# Patient Record
Sex: Female | Born: 1940 | Race: Black or African American | Hispanic: No | Marital: Married | Smoking: Former smoker
Health system: Southern US, Community
[De-identification: ages and names within clinical notes are randomized; demographics above are authoritative.]

## PROBLEM LIST (undated history)

## (undated) DIAGNOSIS — N1832 Chronic kidney disease, stage 3b: Secondary | ICD-10-CM

## (undated) DIAGNOSIS — I10 Essential (primary) hypertension: Secondary | ICD-10-CM

## (undated) DIAGNOSIS — Z9989 Dependence on other enabling machines and devices: Secondary | ICD-10-CM

## (undated) DIAGNOSIS — R519 Headache, unspecified: Secondary | ICD-10-CM

## (undated) DIAGNOSIS — E7800 Pure hypercholesterolemia, unspecified: Secondary | ICD-10-CM

## (undated) DIAGNOSIS — M65331 Trigger finger, right middle finger: Secondary | ICD-10-CM

## (undated) DIAGNOSIS — E785 Hyperlipidemia, unspecified: Secondary | ICD-10-CM

## (undated) DIAGNOSIS — T4145XA Adverse effect of unspecified anesthetic, initial encounter: Secondary | ICD-10-CM

## (undated) DIAGNOSIS — G4733 Obstructive sleep apnea (adult) (pediatric): Secondary | ICD-10-CM

## (undated) DIAGNOSIS — R011 Cardiac murmur, unspecified: Secondary | ICD-10-CM

## (undated) DIAGNOSIS — M1990 Unspecified osteoarthritis, unspecified site: Secondary | ICD-10-CM

## (undated) DIAGNOSIS — I672 Cerebral atherosclerosis: Secondary | ICD-10-CM

## (undated) DIAGNOSIS — G47411 Narcolepsy with cataplexy: Secondary | ICD-10-CM

## (undated) DIAGNOSIS — T8859XA Other complications of anesthesia, initial encounter: Secondary | ICD-10-CM

## (undated) DIAGNOSIS — G8929 Other chronic pain: Secondary | ICD-10-CM

## (undated) DIAGNOSIS — M65351 Trigger finger, right little finger: Secondary | ICD-10-CM

## (undated) DIAGNOSIS — M65321 Trigger finger, right index finger: Secondary | ICD-10-CM

## (undated) DIAGNOSIS — M65311 Trigger thumb, right thumb: Secondary | ICD-10-CM

## (undated) DIAGNOSIS — M65341 Trigger finger, right ring finger: Secondary | ICD-10-CM

## (undated) DIAGNOSIS — I739 Peripheral vascular disease, unspecified: Secondary | ICD-10-CM

## (undated) DIAGNOSIS — K623 Rectal prolapse: Secondary | ICD-10-CM

## (undated) DIAGNOSIS — K219 Gastro-esophageal reflux disease without esophagitis: Secondary | ICD-10-CM

## (undated) DIAGNOSIS — G43909 Migraine, unspecified, not intractable, without status migrainosus: Secondary | ICD-10-CM

## (undated) DIAGNOSIS — K922 Gastrointestinal hemorrhage, unspecified: Secondary | ICD-10-CM

## (undated) DIAGNOSIS — R51 Headache: Secondary | ICD-10-CM

## (undated) HISTORY — DX: Narcolepsy with cataplexy: G47.411

## (undated) HISTORY — DX: Dependence on other enabling machines and devices: Z99.89

## (undated) HISTORY — PX: ROTATOR CUFF REPAIR: SHX139

## (undated) HISTORY — DX: Headache, unspecified: R51.9

## (undated) HISTORY — PX: TUBAL LIGATION: SHX77

## (undated) HISTORY — DX: Hyperlipidemia, unspecified: E78.5

## (undated) HISTORY — DX: Pure hypercholesterolemia, unspecified: E78.00

## (undated) HISTORY — DX: Other chronic pain: G89.29

## (undated) HISTORY — PX: ABDOMINAL HYSTERECTOMY: SHX81

## (undated) HISTORY — DX: Essential (primary) hypertension: I10

## (undated) HISTORY — PX: BACK SURGERY: SHX140

## (undated) HISTORY — DX: Obstructive sleep apnea (adult) (pediatric): G47.33

## (undated) HISTORY — DX: Unspecified osteoarthritis, unspecified site: M19.90

## (undated) HISTORY — DX: Cardiac murmur, unspecified: R01.1

## (undated) HISTORY — DX: Migraine, unspecified, not intractable, without status migrainosus: G43.909

## (undated) HISTORY — DX: Headache: R51

## (undated) HISTORY — PX: CARDIAC CATHETERIZATION: SHX172

## (undated) HISTORY — DX: Cerebral atherosclerosis: I67.2

## (undated) HISTORY — PX: TOTAL ABDOMINAL HYSTERECTOMY: SHX209

## (undated) MED FILL — Iron Sucrose Inj 20 MG/ML (Fe Equiv): INTRAVENOUS | Qty: 10 | Status: AC

---

## 1996-11-13 HISTORY — PX: KIDNEY SURGERY: SHX687

## 1998-05-14 ENCOUNTER — Ambulatory Visit (HOSPITAL_COMMUNITY): Admission: RE | Admit: 1998-05-14 | Discharge: 1998-05-14 | Payer: Self-pay | Admitting: *Deleted

## 1998-05-29 ENCOUNTER — Emergency Department (HOSPITAL_COMMUNITY): Admission: EM | Admit: 1998-05-29 | Discharge: 1998-05-29 | Payer: Self-pay | Admitting: Emergency Medicine

## 1998-06-18 ENCOUNTER — Inpatient Hospital Stay (HOSPITAL_COMMUNITY): Admission: RE | Admit: 1998-06-18 | Discharge: 1998-06-21 | Payer: Self-pay | Admitting: Urology

## 1999-05-20 ENCOUNTER — Ambulatory Visit (HOSPITAL_COMMUNITY): Admission: RE | Admit: 1999-05-20 | Discharge: 1999-05-20 | Payer: Self-pay | Admitting: Obstetrics & Gynecology

## 2000-06-08 ENCOUNTER — Ambulatory Visit (HOSPITAL_COMMUNITY): Admission: RE | Admit: 2000-06-08 | Discharge: 2000-06-08 | Payer: Self-pay | Admitting: *Deleted

## 2000-06-11 ENCOUNTER — Encounter: Payer: Self-pay | Admitting: Family Medicine

## 2000-06-11 ENCOUNTER — Encounter: Admission: RE | Admit: 2000-06-11 | Discharge: 2000-06-11 | Payer: Self-pay | Admitting: Family Medicine

## 2000-07-25 ENCOUNTER — Encounter: Payer: Self-pay | Admitting: *Deleted

## 2000-07-25 ENCOUNTER — Ambulatory Visit (HOSPITAL_COMMUNITY): Admission: RE | Admit: 2000-07-25 | Discharge: 2000-07-25 | Payer: Self-pay | Admitting: *Deleted

## 2001-07-19 ENCOUNTER — Ambulatory Visit (HOSPITAL_COMMUNITY): Admission: RE | Admit: 2001-07-19 | Discharge: 2001-07-19 | Payer: Self-pay

## 2001-07-30 ENCOUNTER — Encounter: Admission: RE | Admit: 2001-07-30 | Discharge: 2001-10-28 | Payer: Self-pay | Admitting: Family Medicine

## 2001-11-08 ENCOUNTER — Observation Stay (HOSPITAL_COMMUNITY): Admission: EM | Admit: 2001-11-08 | Discharge: 2001-11-11 | Payer: Self-pay | Admitting: Emergency Medicine

## 2001-11-08 ENCOUNTER — Encounter: Payer: Self-pay | Admitting: Emergency Medicine

## 2001-11-11 ENCOUNTER — Encounter: Payer: Self-pay | Admitting: Internal Medicine

## 2002-03-04 ENCOUNTER — Encounter: Payer: Self-pay | Admitting: Internal Medicine

## 2002-03-05 ENCOUNTER — Inpatient Hospital Stay (HOSPITAL_COMMUNITY): Admission: EM | Admit: 2002-03-05 | Discharge: 2002-03-08 | Payer: Self-pay | Admitting: Emergency Medicine

## 2002-03-07 ENCOUNTER — Encounter: Payer: Self-pay | Admitting: Internal Medicine

## 2002-03-21 ENCOUNTER — Encounter: Payer: Self-pay | Admitting: Gastroenterology

## 2002-03-21 ENCOUNTER — Encounter: Admission: RE | Admit: 2002-03-21 | Discharge: 2002-03-21 | Payer: Self-pay | Admitting: Gastroenterology

## 2002-03-25 ENCOUNTER — Ambulatory Visit (HOSPITAL_COMMUNITY): Admission: RE | Admit: 2002-03-25 | Discharge: 2002-03-25 | Payer: Self-pay | Admitting: Gastroenterology

## 2002-07-25 ENCOUNTER — Ambulatory Visit (HOSPITAL_COMMUNITY): Admission: RE | Admit: 2002-07-25 | Discharge: 2002-07-25 | Payer: Self-pay | Admitting: Obstetrics & Gynecology

## 2004-06-27 ENCOUNTER — Encounter (INDEPENDENT_AMBULATORY_CARE_PROVIDER_SITE_OTHER): Payer: Self-pay | Admitting: *Deleted

## 2004-06-27 ENCOUNTER — Ambulatory Visit (HOSPITAL_COMMUNITY): Admission: RE | Admit: 2004-06-27 | Discharge: 2004-06-27 | Payer: Self-pay | Admitting: Gastroenterology

## 2008-06-10 ENCOUNTER — Encounter: Admission: RE | Admit: 2008-06-10 | Discharge: 2008-06-10 | Payer: Self-pay | Admitting: Family Medicine

## 2008-07-09 ENCOUNTER — Encounter (INDEPENDENT_AMBULATORY_CARE_PROVIDER_SITE_OTHER): Payer: Self-pay | Admitting: Neurology

## 2008-07-09 ENCOUNTER — Ambulatory Visit: Payer: Self-pay

## 2009-04-19 ENCOUNTER — Encounter: Admission: RE | Admit: 2009-04-19 | Discharge: 2009-04-19 | Payer: Self-pay | Admitting: Family Medicine

## 2009-05-25 ENCOUNTER — Ambulatory Visit (HOSPITAL_COMMUNITY): Admission: RE | Admit: 2009-05-25 | Discharge: 2009-05-27 | Payer: Self-pay | Admitting: Orthopedic Surgery

## 2010-12-30 ENCOUNTER — Other Ambulatory Visit (HOSPITAL_COMMUNITY): Payer: Self-pay | Admitting: Family Medicine

## 2010-12-30 DIAGNOSIS — Z1231 Encounter for screening mammogram for malignant neoplasm of breast: Secondary | ICD-10-CM

## 2011-01-10 ENCOUNTER — Ambulatory Visit (HOSPITAL_COMMUNITY)
Admission: RE | Admit: 2011-01-10 | Discharge: 2011-01-10 | Disposition: A | Discharge status: Home / Self Care | Payer: Medicare Other | Source: Ambulatory Visit | Attending: Family Medicine | Admitting: Family Medicine

## 2011-01-10 DIAGNOSIS — Z1231 Encounter for screening mammogram for malignant neoplasm of breast: Secondary | ICD-10-CM

## 2011-02-19 LAB — GLUCOSE, CAPILLARY
Glucose-Capillary: 121 mg/dL — ABNORMAL HIGH (ref 70–99)
Glucose-Capillary: 129 mg/dL — ABNORMAL HIGH (ref 70–99)
Glucose-Capillary: 137 mg/dL — ABNORMAL HIGH (ref 70–99)
Glucose-Capillary: 146 mg/dL — ABNORMAL HIGH (ref 70–99)
Glucose-Capillary: 156 mg/dL — ABNORMAL HIGH (ref 70–99)
Glucose-Capillary: 98 mg/dL (ref 70–99)
Glucose-Capillary: 99 mg/dL (ref 70–99)

## 2011-02-19 LAB — HEMOGLOBIN AND HEMATOCRIT, BLOOD
HCT: 36.2 % (ref 36.0–46.0)
HCT: 37.8 % (ref 36.0–46.0)
Hemoglobin: 11.9 g/dL — ABNORMAL LOW (ref 12.0–15.0)
Hemoglobin: 12.3 g/dL (ref 12.0–15.0)

## 2011-02-19 LAB — CBC
HCT: 39.8 % (ref 36.0–46.0)
Hemoglobin: 13 g/dL (ref 12.0–15.0)
MCHC: 32.7 g/dL (ref 30.0–36.0)
MCV: 83.2 fL (ref 78.0–100.0)
Platelets: 259 10*3/uL (ref 150–400)
RBC: 4.78 MIL/uL (ref 3.87–5.11)
RDW: 14.8 % (ref 11.5–15.5)
WBC: 5.5 10*3/uL (ref 4.0–10.5)

## 2011-02-19 LAB — COMPREHENSIVE METABOLIC PANEL
Albumin: 3.9 g/dL (ref 3.5–5.2)
BUN: 11 mg/dL (ref 6–23)
Creatinine, Ser: 0.83 mg/dL (ref 0.4–1.2)
Total Protein: 7.3 g/dL (ref 6.0–8.3)

## 2011-02-19 LAB — DIFFERENTIAL
Lymphocytes Relative: 33 % (ref 12–46)
Monocytes Absolute: 0.5 10*3/uL (ref 0.1–1.0)
Monocytes Relative: 8 % (ref 3–12)
Neutro Abs: 3 10*3/uL (ref 1.7–7.7)

## 2011-02-19 LAB — URINE MICROSCOPIC-ADD ON

## 2011-02-19 LAB — URINALYSIS, ROUTINE W REFLEX MICROSCOPIC
Bilirubin Urine: NEGATIVE
Nitrite: NEGATIVE
Specific Gravity, Urine: 1.017 (ref 1.005–1.030)
Urobilinogen, UA: 0.2 mg/dL (ref 0.0–1.0)

## 2011-02-19 LAB — TYPE AND SCREEN

## 2011-02-19 LAB — APTT: aPTT: 31 seconds (ref 24–37)

## 2011-02-19 LAB — ABO/RH: ABO/RH(D): O POS

## 2011-03-28 NOTE — Op Note (Signed)
NAME:  Stephanie Barber, Stephanie Barber NO.:  [id number]   MEDICAL RECORD NO.:  [id number]          PATIENT TYPE:  AMB   LOCATION:  DAY                          FACILITY:  [facility]   PHYSICIAN:  Ronald A. Gioffre, M.D.DATE OF BIRTH:  12-Aug-1941   DATE OF PROCEDURE:  05/25/2009  DATE OF DISCHARGE:                               OPERATIVE REPORT   SURGEON:  Georges Lynch. Darrelyn Hillock, M.D.   ASSISTANT:  Marlowe Kays, M.D.   PREOPERATIVE DIAGNOSIS:  1. Spinal stenosis at L4-5.  2. 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  3. Foot drop on the left.   POSTOPERATIVE DIAGNOSIS:  1. Spinal stenosis at L4-5.  2. 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  3. Foot drop on the left.   OPERATION:  1. Central decompressive lumbar laminectomy at L4-5 for spinal      stenosis.  2. Microdiskectomy, L4-5, on the left for a large herniated disk.  3. Foraminotomies L4-5, left.   PROCEDURE NOTE:  She had left leg pain only preop.   PROCEDURE:  Under general anesthesia, she first had 1 gram of IV Ancef.  Sterile prep and drape of the lower back was carried out.  He was on the  spinal frame.  Following that, 2 needles were placed in the back for  localization purposes.  An x-ray was taken.  At this time, incision was  made over the L4-5 interspace.  Bleeders were identified and cauterized.  The muscle was stripped from the lamina and spinous processes  bilaterally.  Another x-ray was taken with 2 Kocher clamps and a  Penfield 4 instrument in place to verify our position.  We then went  down and carried out our central decompressive lumbar laminectomy for  spinal stenosis.  We decompressed both sides.  We preserved the facets.  We then removed ligamentum flavum after we brought the microscope in.  We did protect the dura at all times.  We went down did a nice  foraminotomy for the L5 root on the left.  We then gently isolated the  dura and the L5 root, and the microscope was used.  A cruciate incision  was made in the posterior  longitudinal ligament.  She had large  scattered pieces of disk material that we teased out from the  subligamentous space.  We then went into the disk space itself and  completed a diskectomy.  We did remove some disk material out laterally  on the foramen as well.  We made multiple attempts to remove the disk.  There was a large thickened posterior longitudinal ligament.  The nerve  root now was free.  We were able to easily move the hockey-stick up back  and forth above and anterior and posterior to the nerve root out the  foramen.  It was freed.  The dura was free.  We thoroughly irrigated out  the area.  Good hemostasis was maintained.  We injected 10 mL of FloSeal  and then loosely  applied some Gelfoam out laterally.  The wound was closed in layers in  the usual fashion.  I did leave a small deep distal and proximal part  of  the wound open for drainage purposes.  Subcu was closed with 0 Vicryl.  Skin was closed with metal staples.  Sterile Neosporin dressing was  applied.           ______________________________  Georges Lynch Darrelyn Hillock, M.D.     RAG/MEDQ  D:  05/25/2009  T:  05/25/2009  Job:  [id number]

## 2011-03-31 NOTE — Op Note (Signed)
NAME:  Stephanie Barber, Stephanie Barber                      ACCOUNT NO.:  [id number]   MEDICAL RECORD NO.:  [id number]                   PATIENT TYPE:  AMB   LOCATION:  ENDO                                 FACILITY:  [facility]   PHYSICIAN:  Bernette Redbird, M.D.                DATE OF BIRTH:  12-Feb-1941   DATE OF PROCEDURE:  06/27/2004  DATE OF DISCHARGE:                                 OPERATIVE REPORT   PROCEDURE:  Colonoscopy with biopsies.   INDICATIONS FOR PROCEDURE:  70 year old female with altered bowel habits and  rectal bleeding.   FINDINGS:  Pancolonic diverticulosis.   PROCEDURE:  The nature, purpose, and risks of the procedure were familiar to  the patient from prior examination, but the risks were reviewed at a recent  office visit and she provided written consent.  Sedation was Fentanyl 60 mcg  and Versed 5 mg IV without arrhythmias or desaturations.  The Olympus  adjustable tension pediatric video colonoscope was advanced to the base of  the cecum without significant difficulty, turning the patient into the  supine position and applying some external abdominal compression to get the  tip of the scope to enter the base of the cecum.  Pull back was then  performed.  I entered the terminal ileum for a short distance and it  appeared normal.  The main finding on this exam was rather extensive  pancolonic diverticulosis.  However, there was no evidence of colitis to  account for her tendency toward frequent bowel movements with rectal  bleeding.  No polyps, cancer, or vascular malformations were noted.  Retroflexion in the rectum was really unremarkable without significant  hemorrhoids and reinspection of the rectum was unremarkable.  The patient  tolerated the procedure well and there were no apparent complications.   IMPRESSION:  1. Change in bowel habits without obvious endoscopic abnormalities with     rectal bleeding presumably due to rectal outlet origin (569.3).  2. Pancolonic  diverticulosis.   PLAN:  Await pathology on random colonic biopsies which were obtained during  the course of today's procedure.                                               Bernette Redbird, M.D.    RB/MEDQ  D:  06/27/2004  T:  06/27/2004  Job:  [id number]   cc:   [name] A. Nicholos Johns, M.D.  [address]  [place]  [place] [address]  Fax: [phone]

## 2011-03-31 NOTE — Discharge Summary (Signed)
[facility]  Patient:    Stephanie Barber, Stephanie Barber Visit Number: [id number] MRN: [id number]          Service Type: MED Location: [id number] Attending Physician:  Jackie Plum Dictated by:   Jackie Plum, M.D. Admit Date:  11/08/2001 Disc. Date: 11/11/01   CC:         Francisca December, M.D.  Dr. Chrissie Noa, M.D.   Discharge Summary  DISCHARGE DIAGNOSES: 1. Chest pain, resolved.    a. Cardiolite stress test done on 11/11/01, negative for any ischemic       changes. 2. History of gastroesophageal reflux disease with esophageal stricture,    status post dilatation by Dr. Matthias Hughs. 3. Type 2 diabetes, complicated by peripheral neuropathy. 4. Arthritis. 5. History of right kidney surgery. 6. Status post hysterectomy with left oophorectomy.  DISCHARGE MEDICATIONS: 1. Aspirin 81 mg p.o. q.d. 2. Neurontin 500 mg p.o. q.h.s. 3. Protonix 40 mg p.o. q.d. 4. Metoprolol 25 mg p.o. b.i.d. 5. Glucophage 500 mg tablet 1/2 tablet p.o. b.i.d. 6. Celebrex 200 mg p.o. q.d. p.r.n.  DISCHARGE LABORATORY DATA:  CBC:  White blood cell count 5.9, hemoglobin 13.8, hematocrit 39.9, MCV 82.2, platelets 266.  Sodium 139, potassium 4.1, chloride 107, bicarbonate 29, BUN 10, creatinine 0.9, glucose 188.  CONSULTATIONS:  Dr. Amil Amen of [facility].  PROCEDURES:  Cardiolite stress test done on 11/11/01.  ACTIVITY:  As tolerated.  DIET:  An 1800 calorie ADA diet with low salt, low fat diet.  DISCHARGE INSTRUCTIONS:  The patient is to report to M.D. if she has any chest pains or shortness of breath, or dizziness.  FOLLOWUP:  The patients primary care physician, Dr. Azucena Kuba, in about two weeks. The patient is to call for appointment.  HISTORY OF PRESENT ILLNESS:  The patient presented on November 07, 2001, to the ED with complaint of left-sided chest pain radiating to her lower scapular region with left shoulder heaviness, lasting about 2 hours.   The pain awoke her from her sleep, and it was associated with nausea, shortness of breath, and diaphoresis.  The pain was said to be different in characteristics from her usual pain from gastroesophageal reflux disease.  PHYSICAL EXAMINATION:  VITAL SIGNS:  Blood pressure was 203/81, later went down to 156/90, respiratory rate 16, pulse 75, temperature 96.8.  GENERAL:  The patient was uncomfortable appearing at the time of initial evaluation by admitting doctor with no JVD.  HEART:  Regular rate and rhythm.  LUNGS:  Clear to auscultation.  HOSPITAL COURSE:  #1 - CHEST PAIN:  On account of the patients coronary artery disease risk factors and the wax and waning pain which was said to be different from her usual gastroesophageal reflux disease pain, she was admitted to the intensive care unit, started on a heparin drip and IV nitroglycerin.  During her intensive care unit course the patient continued to have episodes of waxing and waning chest pain, occasionally radiating to her left shoulder.  She subsequently ruled out for myocardial infarction by serial cardiac enzymes and EKGs, but continued to have two episodes of chest pain on the day of discharge, and therefore cardiology was consulted.  The patient was evaluated by Dr. Amil Amen who agreed with plan for Cardiolite stress test.  Cardiolite stress test was done today, 11/11/01, and was read as being negative for any cardiac ischemia.  We recommend that if the patient continues to experience episodes of chest pain which are different from her  usual gastroesophageal reflux disease symptomatology, and if cardiac cause cannot be ruled out, the patient have a cardiac catheterization in view of her risk factors (patient has diabetes mellitus, questionably uncontrolled with complications complicated by peripheral neuropathy).  #2 - HIGH BLOOD PRESSURE:  The patient does not have any history of hypertension, but during  hospitalization her blood pressures were found to be high for a diabetic.  Blood pressure on discharge was 143/71, and therefore we recommend that she have continued outpatient monitoring of her blood pressure, and if it is suboptimal (optimal blood pressure for a diabetic should be around 120/80), the patient be started back on ACE inhibitor.  During hospitalization, the patients other medical problems, see problem list and discharge diagnoses above, were inconsequential. Dictated by:   Jackie Plum, M.D. Attending Physician:  Jackie Plum DD:  11/11/01 TD:  11/11/01 Job: [id number]

## 2011-03-31 NOTE — Discharge Summary (Signed)
[facility]  Patient:    Stephanie Barber, Stephanie Barber Visit Number: [id number] MRN: [id number]          Service Type: MED Location: [phone] Attending Physician:  Miguel Aschoff Dictated by:   Viviana Simpler, M.D. Admit Date:  03/04/2002 Discharge Date: 03/08/2002   CC:         [name] A. Eliezer Lofts., M.D.  Florencia Reasons, M.D.  Francisca December, M.D.  Sheran Luz, M.D.   Discharge Summary  DATE OF BIRTH:  [birth year].  CONSULTATIONS:  Francisca December, M.D.  PROCEDURES:  Cardiac catheterization (normal).  DISCHARGE DIAGNOSES:  1. Atypical chest pain, recurrent.  2. Hyperlipidemia, (total cholesterol 252, LDL 180, HDL ______ ).  3. Type 2 diabetes mellitus with peripheral neuropathy.  4. Hypertension.  5. Gastroesophageal reflux disease with history of esophageal stricture,     status post dilatation in the past.  6. History of left renal biopsy, hemorrhagic cyst excision August, 1999,     (benign).  7. History of chronic cystic renal disease.  8. History of sickle cell trait per chart.  9. History of hysterectomy with left salpingo-oophorectomy. 10. History of diverticulosis. 11. History of right rotator cuff surgery.  DISCHARGE MEDICATIONS:  1. Patient is to discontinue her Toprol XL and to replace it with     Norvasc 10 mg p.o. q.d. for one month, (see below).  2. Protonix 40 mg p.o. q.d.  3. Enteric coated aspirin 81 mg p.o. q.d.  4. Neurontin 400 mg p.o. q.d.  5. Glucophage 500 mg one-half p.o. b.i.d.  6. Celebrex 200 mg p.o. b.i.d.  7. Darvocet-N 100 one to two q.4-6h. p.r.n.  DISCHARGE FOLLOW UP:  Follow up with Dr. Elias Else within one week.  HISTORY OF PRESENT ILLNESS:  The patient is a 70 year old woman with multiple coronary risk factors who has suffered from recurrent chest pain for some time, most recently requiring hospitalization December, 2002 at which time she underwent nuclear perfusion imaging of  the lungs, negative for pulmonary embolus and stress Cardiolite which was negative for ischemia.  It was entertained that she may have been suffering from gastroesophageal reflux disease.  The patient presents this time with similar symptoms, occurring two hours after lunch while at rest, described as 8:10 intense left chest pressure radiating straight to the left arm and back accompanied by left arm heaviness, numbness, diaphoresis, but no shortness of breath.  Independent episodes of nausea had been brief and intermittent.  The pain is unrelated to rest or exertion, body position, meals or time of day, and is not pleuritic.  She has become exceeding anxious for fear of pain exacerbating to the point where she can no longer tolerate it.  For further details of history and physical please see report by Dr. Trula Slade, admitting physician.  HOSPITAL COURSE: #1 - ATYPICAL CHEST PAIN:  The patient was admitted to the telemetry unit for routine rule out myocardial infarction protocol with negative results.  She was treated empirically with continuation of her Toprol and aspirin and addition of nitroglycerin patch which was not helpful.  Given her multiple risk factors, she underwent cardiac catheterization performed by Dr. Corliss Marcus with normal findings and normal ejection fraction.  Her pain is thus felt to be noncardiac in origin.  After a period of two pain-free days and anticipating discharge, I found the patient to be experiencing a recurrence of her pain of the same description accompanied by  diaphoresis in my presence.  She received no benefit from a gastrointestinal cocktail. Norvasc was added for smooth muscle relaxation, and Darvocet relieved her pain.  She underwent cervical, thoracic plain films which revealed no significant degenerative joint disease except for questionable loss of disc space at C3-C4 which did not correspond to the level of her pain.  She had no  reproducible symptoms on chest palpation or AP compression or abdominal examination.  It is unclear to me what the etiology of the patients pain is, although at this point it is certainly exacerbated by anxiety, she does not want medicine for this, however.  It is possible she may be suffering further reflux or esophageal spasm difficulties.  She does not give a history consistent with return of her esophageal stricture.  The lack of positional component makes spinal nerve root compression very unlikely.  There is no cough and chest x-ray is negative which would make chest CT scan rather unhelpful in this regard.  On the date of discharge the patient has been pain-free for over 12 hours.  We talked at length about how to proceed, she finally elects discharge to follow up with Dr. Nicholos Johns.  We will try Norvasc instead of Toprol for the smooth muscle relaxation properties which may also serve as her antihypertensive if sufficient alone.  After a one month trial Dr. Nicholos Johns may decide which of these medicines has been more helpful to her.  #2 - TYPE 2 DIABETES MELLITUS:  Glucophage was held this admission secondary to catheterization.  #3 - HYPERTENSION.  #4 - HYPERLIPIDEMIA.  As noted in old records, at the time of this dictation, I note that she is not on a statin which needs to be addressed as an outpatient.  The remainder of her chronic medical problems are stable. Dictated by:   Viviana Simpler, M.D. Attending Physician:  Miguel Aschoff DD:  03/08/02 TD:  03/10/02 Job: [id number]

## 2011-03-31 NOTE — Cardiovascular Report (Signed)
[place]  Patient:    Stephanie Barber, Stephanie Barber Visit Number: [id number] MRN: [id number]          Service Type: MED Location: [phone] Attending Physician:  Miguel Aschoff Dictated by:   Francisca December, M.D. Proc. Date: 03/06/02 Admit Date:  03/04/2002   CC:         Stephanie Maduro A. Eliezer Lofts., M.D.  Viviana Simpler, M.D.  Cardiac Catheterization Laboratory   Cardiac Catheterization  PROCEDURES PERFORMED: 1. Left heart catheterization. 2. Coronary angiography. 3. Left ventriculogram. 4. Abdominal aortogram. 5. Left femoral arteriogram, with percutaneous closure (Perclose).  INDICATION:  Stephanie Barber is a 70 year old woman with nonanginal chest pain, recurrent. She had initial admission for chest pain in December 2002 and underwent a myocardial perfusion study that was normal.  She returns now with similar discomfort, although has not really been troubled in the past three months with this.  Because of her age and multiple risk factors for coronary heart disease (including hypertension and diabetes) she is brought to the cardiac catheterization laboratory to identify possible CAD as an etiology and provide for further therapeutic options.  She will undergo abdominal aortogram because of severe and poorly controlled hypertension, as it may be possibly caused by renal artery stenosis.  PROCEDURAL NOTE:  Left heart catheterization was performed following the percutaneous insertion of a 6-French catheter sheath utilizing an anterior approach over a guiding J wire into the right femoral artery.  A 110-cm pigtail catheter was used to measure pressures in the ascending aorta and in the left ventricle, both prior to and following the ventriculogram.  A 30-degree RAO cine left ventriculogram was performed utilizing a power injector.  The pigtail catheter was then withdrawn to the level of the first lumbar vertebrae.  A second  cineangiographic abdominal angiogram was obtained, again utilizing a power injector.  This was for visualization of the renal arteries.  The pigtail catheter was then exchanged for a 6-French #4 left Judkins catheter.  Following the sublingual administration of 0.4 mg nitroglycerin, cineangiography was performed of the left coronary artery in multiple LAO and RAO projections.  The 6-French #4 left Judkins catheter was exchanged for a 6-French #4 right Judkins catheter.  All catheter manipulations were performed utilizing fluoroscopic observation and exchanges performed over a longing guiding J-wire.  Cineangiography of the right coronary artery was conducted in multiple LAO and RAO projections.  The right coronary catheter was removed.  A right femoral arteriogram was performed, utilizing the arterial sheath in a 45-degree RAO projection.  This demonstrated the right femoral artery to be of adequate size (greater than 5 mm) and the arteriotomy site was well above the bifurcation into the profunda femoris and superficial femoral arteries.  There was superficial atherosclerosis seen throughout the course of the right femoral artery.  The arterial sheath was then removed, and the arteriotomy closed percutaneously using the Perclose system.  There was good hemostasis and an intact distal pulse.  The patient was transported to the recovery area in stable condition.  HEMODYNAMICS: 1. Systemic arterial pressure:  199/99 with a mean of 140 mmHg.  There was no    systolic gradient across the aortic valve. 2. Left ventricular end-diastolic pressure:  23 mmHg (pre and post-    ventriculogram).  LEFT VENTRICULOGRAPHY:  The left ventriculogram demonstrated intact global systolic function.  Estimated ejection fraction is 75%.  Assessment of LV function is not possible due to the presence of ventricular tachycardia during  the injection.  There was no significant mitral regurgitation nor was  there coronary calcification seen.  There was concentric hypertrophy to a mild degree.  ABDOMINAL AORTOGRAM:  Demonstrated the renal arteries to be widely patent bilaterally.  There were luminal irregularities throughout the course of the renal arteries, as there were in the distal aorta.  CORONARY ANGIOGRAPHY:  There was a right dominant coronary system present. 1. LEFT MAIN CORONARY ARTERY:  Normal. 2. LEFT ANTERIOR DESCENDING ARTERY:  (and its branches)  Minimally diseased;    there were luminal irregularities throughout the course of the vessel.    There were three small diagonal branches which arose.  There were no    significant obstructions within this vessel or its branches. 3. LEFT CIRCUMFLEX ARTERY:  (and its branches) Minimally diseased; again    there was luminal irregularities throughout the course of the vessel.    Moderate-sized first marginal branch arises, but a small second marginal    branch and a very large true obtuse marginal branch.  Again, no    significant obstruction within this vessel or its branches. 4. RIGHT CORONARY ARTERY:  (and its branches) Minimally diseased; again    there are luminal irregularities throughout the course of the vessel.  It    bifurcates into a moderate-sized posterolateral segment and branch and    a moderate-sized posterior descending artery -- without any significant    obstructions. 5. COLLATERALS:  Collateral vessels are not seen.  FINAL IMPRESSION: 1. Severe systemic hypertension. 2. Mild to moderate concentric hypertrophy, with intact global systolic    function and no regional wall motion abnormality. 3. No significant obstructive coronary artery disease. 4. Mild atherosclerosis is seen throughout. 5. Elevated left ventricular end-diastolic pressure. 6. Of note, the patient had intermittent bouts of relative hypotension    where her pressure would fall into the normal range during the case.     This was associated with a  accelerated junctional rhythm, with lack of    atrial "kick".  This resulted in her transient relative hypotension.  PLAN/RECOMMENDATION:  Aggressive control of systemic hypertension is certainly indicated, as you are proceeding with.  Her chest pain does not seem to be attributable in any way to coronary ischemia. Dictated by:   Francisca December, M.D. Attending Physician:  Miguel Aschoff DD:  03/06/02 TD:  03/07/02 Job: [id number]

## 2011-03-31 NOTE — Consult Note (Signed)
[place]. [facility]  Patient:    Stephanie Barber, Stephanie Barber Visit Number: [id number] MRN: [id number]          Service Type: MED Location: [id number] Attending Physician:  Jackie Plum Dictated by:   Francisca December, M.D. Proc. Date: 11/09/01 Admit Date:  11/08/2001   CC:         Glori Luis _____, M.D.  Robert A. Eliezer Lofts., M.D.   Consultation Report  REASON FOR CONSULTATION: Chest pain.  IMPRESSIONS: 1. 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 2. Positive cardiac risk factors for coronary artery disease, age and    diabetes mellitus. 3. History of gastroesophageal reflux disease and esophageal stricture with    dilatation. 4. Degenerative joint disease, lumbar spine. 5. Status post benign kidney tumor removal. 6. 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 7. History of hysterectomy with left oophorectomy.  RECOMMENDATIONS: 1. Agree with your management thus far to include aspirin, intravenous    heparin, nitroglycerin, beta blocker. 2. Would begin Mylanta II 30 cc p.o. q.6h. and a proton pump inhibitor. 3. Agree with plans for exercise Cardiolite December 30.  FINDINGS: The patient is a 69 year old woman, who on late Christmas day developed left-sided chest discomfort that radiated through to the back just below the left scapula. This waxes and waned over the next 12-18 hours and finally resolved after she took two tablespoons of Mylanta II and a Coca-Cola producing a large belch. However, on the evening of the 26th about one-hour after retiring she woke again with the pain, it became quite severe and she had her husband bring her to the emergency room.  Since being admitted, she has continued to have intermittent left-sided chest pain, again radiating through to the lower scapula. On one occasion she did have some heaviness in the left upper arm. She has not had associated nausea, diaphoresis, or emesis. In fact, she recently  completed a large meal and promptly initiated the chest discomfort once again at the time of my evaluation.  CARDIAC RISK FACTORS: Her cardiac risk factors are age and diabetes mellitus.  PAST MEDICAL HISTORY: As above.  CURRENT MEDICATIONS: 1. Aspirin 325 mg p.o. q.d. 2. Metoprolol 12.5 mg p.o. b.i.d. 3. Neurontin 500 mg p.o. q.d. 4. Sliding-scale insulin. 5. IV heparin and nitroglycerin.  OUTPATIENT MEDICATIONS: 1. Glucophage 250 mg p.o. b.i.d. 2. Neurontin 500 mg p.o. q.h.s. 3. Celebrex 200 mg p.o. q.d.  ALLERGIES: None known.  SOCIAL HISTORY: Does not drink alcoholic products or use tobacco. She is married, has 4 children.  FAMILY HISTORY: Father died age 9 of cancer. Mother died age 15 of myocardial infarction, had CHF. One sister now is having some problems with chest pain.  REVIEW OF SYSTEMS: No difficulty with her vision or hearing. Has not had difficulty swallowing recently. She denies any cough or hemoptysis. She has not had tachy palpitations or lightheadedness, not been short of breath. Her energy level has been good. She has not had abdominal pain, nausea, hematemesis, hematochezia, or melena. Denies any dysuria, hematuria or nocturia. No major joint pain or swelling. No muscle weakness or pain. No bleeding or bruising tendency.  PHYSICAL EXAMINATION:  VITAL SIGNS: Blood pressure is 132/82, pulse is 75 and regular, respiratory rate is 16, temperature afebrile.  GENERAL: She is a well appearing 70 year old woman, alert, cooperative in no distress.  HEENT: Unremarkable. The head is atraumatic and normocephalic. The pupils, equal, round, reactive to light and accommodation. Extraocular movements intact. Oral mucosa is pink and  moist. Tongue is not coated.  NECK: Supple without thyromegaly or masses. Carotid upstrokes are normal. No bruit. No jugular venous distention.  CHEST: Clear. Good excursion. Chest wall is nontender.  HEART: Regular rhythm. Normal  S1 and S2. No murmur, click or rub. Question of an S4 present.  ABDOMEN: Soft, flat, nontender. No hepatosplenomegaly or midline pulsatile mass. Bowel sounds present in all quadrants.  GENITALIA: External genitalia is without lesions.  RECTAL: Not performed.  EXTREMITIES: Show full range of motion. No edema. Intact distal pulses.  NEUROLOGICAL: Cranial nerves II-XII are intact. Motor and sensory are grossly intact. Gait not tested.  SKIN: Warm, dry, and clear.  LABORATORY DATA: Electrocardiogram: Normal sinus rhythm. Normal ECG and this includes during chest discomfort.  COMMENTS: The only aspect of the patients pain that suggest it is likely to be cardiac, is that it is present in her chest. There is no exertional component and it is quite atypical in nature. However, she does have several risk factors for disease and nitroglycerin has seemed to relieve it on several occasions.  Despite this, and because of her history of GERD, would initiate aggressive measures for this, as above. Will plan to complete her exercise treadmill test with a Cardiolite myocardial perfusion imaging sometime within the next 24 hours, more likely within 48 hours. Dictated by:   Francisca December, M.D. Attending Physician:  Jackie Plum DD:  11/09/01 TD:  11/10/01 Job: [id number]

## 2011-03-31 NOTE — Consult Note (Signed)
[place]. [facility]  Patient:    Stephanie Barber, Stephanie Barber Visit Number: [id number] MRN: [id number]          Service Type: MED Location: [id number] Attending Physician:  Miguel Aschoff Dictated by:   Darci Needle, M.D. Admit Date:  03/04/2002 Discharge Date: 03/08/2002   CC:         Francisca December, M.D.  Robert A. Eliezer Lofts., M.D.   Consultation Report  REASON FOR CONSULTATION:  Chest and arm discomfort.  CONCLUSIONS: 1. Left chest and left arm and back discomfort, etiology uncertain.  Atypical    for coronary artery disease.  Suspect cervical disk or thoracic disk    disease.  Due to the patients multiple risk factors, however, coronary    artery disease needs to be excluded. 2. Diabetes mellitus. 3. Severe hypercholesterolemia with LDL cholesterol 183. 4. Hypertension. 5. Family history of coronary artery disease.  RECOMMENDATIONS: 1. Diagnostic coronary angiography to define coronary anatomy and help guide    therapy. 2. Risk factor modification with aggressive antilipid therapy whether or not    coronary artery disease is identified in this diabetic patient.  COMMENTS:  The patient is a very pleasant 70 year old female who has been troubled by left arm discomfort intermittently since January.  She was admitted to the hospital in December with chest and arm pain and had a Cardiolite stress test performed by Dr. Amil Amen that was negative for evidence of ischemia.  Despite this she has had recurrent symptoms and now also including left parasternal discomfort that radiates through to her back. There is no associated shortness of breath.  These episodes are not precipitated by physical activity.  ALLERGIES:  None.  SOCIAL HISTORY:  Habits:  She does not smoke and does not drink.  PAST MEDICAL HISTORY:  Listed above.  Gastroesophageal reflux disease with history of esophageal stricture, status post rotator cuff surgery, history  of kidney stones, status post cholecystectomy.  MEDICATIONS: 1. Toprol XL 25 mg q.d. 2. Aspirin 81 mg q.d. 3. Neurontin 400 mg q.d. 4. Glucophage. 5. Celebrex. 6. Protonix.  OBJECTIVE:  GENERAL:  Patient is in no acute distress.  VITAL SIGNS:  Blood pressure 148/70, heart rate 70.  NECK:  Veins not distended.  No carotid bruits.  LUNGS:  Clear.  CARDIAC:  No click, rub, gallop.  ABDOMEN:  Soft.  Liver and spleen not palpable.  Bowel sounds are normal.  EXTREMITIES:  No edema.  NEUROLOGIC:  Unremarkable.  LABORATORIES:  EKG normal with the exception of poor R-wave progression. Dictated by:   Darci Needle, M.D. Attending Physician:  Miguel Aschoff DD:  03/06/02 TD:  03/06/02 Job: [id number]

## 2011-03-31 NOTE — Procedures (Signed)
[name]. [facility]  Patient:    Stephanie Barber, BOTELER Visit Number: [id number] MRN: [id number]          Service Type: END Location: ENDO Attending Physician:  Rich Brave Dictated by:   Florencia Reasons, M.D. Proc. Date: 03/25/02 Admit Date:  03/25/2002   CC:         Fulton Mole, M.D.  Francisca December, M.D.   Procedure Report  PROCEDURE:  Upper endoscopy.  INDICATION:  Nonspecific chest pain in a 70 year old African-American female with negative cardiac evaluation, chest CT scan, and abdominal ultrasound. There has been no significant response to PPI therapy.  FINDINGS:  Two esophageal rings present, with a small hiatal hernia.  No definite source of chest pain endoscopically evident.  ENDOSCOPIST:  Florencia Reasons, M.D.  The nature, purpose, and risks of the procedure were familiar to the patient from prior examination, and she provided written consent.  SEDATION:  Fentanyl 50 mcg and Versed 6 mg IV without arrhythmias or desaturation.  DESCRIPTION OF PROCEDURE:  The Olympus small-caliber adult videoendoscope was passed under direct vision.  The pharyngeal tissues looked a little bit boggy, and the vocal cords are not well seen.  The esophagus was readily entered.  It had normal mucosa without evidence of free gastroesophageal reflux, reflux esophagitis, Barretts esophagus, varices, infection or neoplasia.  There appeared to be a muscular narrowing or ring, not tightly occluding the esophageal lumen, in the distal esophageal region, and at the squamocolumnar junction, there was a more classic thin Schatzkis ring below which was a 2-cm hiatal hernia.  The stomach had a small clear residual.  No gastritis, erosions, ulcers, polyps, masses, or other gastric mucosal abnormalities were seen.  The pylorus, duodenal bulb, and second duodenum looked normal.  The scope was removed from the patient.  Retroflex viewing of the cardia  of the stomach was unremarkable prior to removal of the scope.  The patient tolerated the procedure well, and there were no apparent complications.  No biopsies were obtained.  IMPRESSION:  Small hiatal hernia, esophageal rings, no source of chest symptoms endoscopically evident.  PLAN:  Clinical followup in the office in the near future. Dictated by:   Florencia Reasons, M.D. Attending Physician:  Rich Brave DD:  03/25/02 TD:  03/26/02 Job: [id number]

## 2011-07-21 ENCOUNTER — Telehealth: Payer: Self-pay | Admitting: Pulmonary Disease

## 2011-07-21 NOTE — Telephone Encounter (Signed)
I spoke with pt and she wanted an earlier apt. I advised her since she was a consult Dr. Shelle Iron had no available apts for her to move her apt for an earlier time. I advised pt in the mean time if she feels like she can't wait until then then she should either go to urgent care or ed to be evaluated. Pt verbalized understanding.

## 2011-07-25 ENCOUNTER — Encounter: Payer: Self-pay | Admitting: Pulmonary Disease

## 2011-07-26 ENCOUNTER — Ambulatory Visit (INDEPENDENT_AMBULATORY_CARE_PROVIDER_SITE_OTHER)
Admission: RE | Admit: 2011-07-26 | Discharge: 2011-07-26 | Disposition: A | Discharge status: Home / Self Care | Payer: Medicare Other | Source: Ambulatory Visit | Attending: Pulmonary Disease | Admitting: Pulmonary Disease

## 2011-07-26 ENCOUNTER — Encounter: Payer: Self-pay | Admitting: Pulmonary Disease

## 2011-07-26 ENCOUNTER — Ambulatory Visit (INDEPENDENT_AMBULATORY_CARE_PROVIDER_SITE_OTHER): Payer: Medicare Other | Admitting: Pulmonary Disease

## 2011-07-26 VITALS — BP 116/68 | HR 79 | Temp 97.8°F | Ht 60.0 in | Wt 138.6 lb

## 2011-07-26 DIAGNOSIS — R059 Cough, unspecified: Secondary | ICD-10-CM

## 2011-07-26 DIAGNOSIS — R053 Chronic cough: Secondary | ICD-10-CM

## 2011-07-26 DIAGNOSIS — R05 Cough: Secondary | ICD-10-CM

## 2011-07-26 MED ORDER — BENZONATATE 100 MG PO CAPS: 200.0000 mg | ORAL_CAPSULE | Freq: Four times a day (QID) | ORAL | Status: AC | PRN

## 2011-07-26 MED ORDER — HYDROCOD POLST-CPM POLST ER 10-8 MG PO CP12: 1.0000 | ORAL_CAPSULE | Freq: Two times a day (BID) | ORAL | Status: DC | PRN

## 2011-07-26 NOTE — Assessment & Plan Note (Signed)
Based on the patient's history, her cough sounds much more likely to be upper airway in origin than lower.  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  She is also continually clearing her throat.  There is nothing by history or exam to suggest reactive airways disease, but I have asked her to stop symbicort and we'll check her spirometry next visit if she does not improve.  We'll go ahead and treat her with the cyclical cough protocol, and also intensify treatment for reflux and postnasal drip.  I have also reviewed the behavioral therapies for coughing.

## 2011-07-26 NOTE — Patient Instructions (Addendum)
Will check cxr today, and call you with results. Take dexilant 60mg  one each am, and take your zantac at bedtime See cyclical cough protocol handout.  Follow to the letter.  If you have an issue taking meds, let me known.  No throat clearing, minimize voice use Once you complete 3 days with cyclical cough protocol, take zyrtec 10mg  over the counter each am Stop symbicort followup with me in 2 weeks.

## 2011-07-26 NOTE — Progress Notes (Signed)
  Subjective:    Patient ID: Stephanie Barber, female    DOB: [birth year], 70 y.o.   MRN: [id number]  HPI The patient is a 70 year old female who I've been asked to see for chronic cough.  The patient states her cough started approximate 3 months ago, and was spontaneous without any obvious inciting event.  The cough was dry and hacking in nature, and she initially was taken off her ACE inhibitor and treated with a Z-Pak.  She did not see a big improvement in her cough with this.  She has developed what sounds like a viral URI with a productive cough, but this has resolved and her cough has returned to her prior dry cough.  The patient states there are no aggravating or alleviating factors, and she has been tried on symbicort without relief.  The patient states that she has constant throat clearing, and has noticed a change in her voice.  She denies postnasal drip, but does have a history of reflux.  She takes Zantac for this.  The patient states that she has significant cough paroxysms at times, and that she feels more short of breath with exertional activities.  The patient has not had recent chest x-rays or spirometry..   Review of Systems  Constitutional: Positive for appetite change and unexpected weight change. Negative for fever.  HENT: Positive for congestion and sneezing. Negative for ear pain, nosebleeds, sore throat, rhinorrhea, trouble swallowing, dental problem, postnasal drip and sinus pressure.   Eyes: Negative for redness and itching.  Respiratory: Positive for shortness of breath. Negative for cough, chest tightness and wheezing.   Cardiovascular: Positive for chest pain. Negative for palpitations and leg swelling.  Gastrointestinal: Negative for nausea and vomiting.  Genitourinary: Negative for dysuria.  Musculoskeletal: Negative for joint swelling.  Skin: Negative for rash.  Neurological: Positive for headaches.  Hematological: Does not bruise/bleed easily.    Psychiatric/Behavioral: Negative for dysphoric mood. The patient is not nervous/anxious.        Objective:   Physical Exam Constitutional:  Well developed, no acute distress  HENT:  Nares patent without discharge  Oropharynx without exudate, palate and uvula are normal. Collection of clear secretions in back of throat.  Eyes:  Perrla, eomi, no scleral icterus  Neck:  No JVD, no TMG  Cardiovascular:  Normal rate, regular rhythm, no rubs or gallops.  2/6 sem        Intact distal pulses  Pulmonary :  Normal breath sounds, no stridor or respiratory distress   No rales, rhonchi, or wheezing  Abdominal:  Soft, nondistended, bowel sounds present.  No tenderness noted.   Musculoskeletal:  No lower extremity edema noted.  Lymph Nodes:  No cervical lymphadenopathy noted  Skin:  No cyanosis noted  Neurologic:  Alert, appropriate, moves all 4 extremities without obvious deficit.         Assessment & Plan:

## 2011-08-09 ENCOUNTER — Ambulatory Visit (INDEPENDENT_AMBULATORY_CARE_PROVIDER_SITE_OTHER): Payer: Medicare Other | Admitting: Pulmonary Disease

## 2011-08-09 ENCOUNTER — Encounter: Payer: Self-pay | Admitting: Pulmonary Disease

## 2011-08-09 VITALS — BP 132/58 | HR 80 | Temp 98.4°F | Ht 60.0 in | Wt 138.4 lb

## 2011-08-09 DIAGNOSIS — R05 Cough: Secondary | ICD-10-CM

## 2011-08-09 DIAGNOSIS — R053 Chronic cough: Secondary | ICD-10-CM

## 2011-08-09 DIAGNOSIS — R059 Cough, unspecified: Secondary | ICD-10-CM

## 2011-08-09 NOTE — Assessment & Plan Note (Signed)
The patient states that her cough has improved 90+ percent since the last visit.  I suspect the majority of her cough last visit was due to a cyclical mechanism, but cannot rule out contributions from postnasal drip and reflux.  At this point, I would like to keep her on something stronger for reflux then Zantac, and have asked her to continue with her behavioral therapies for a few more weeks.  She is to call me if her cough re\re escalates.

## 2011-08-09 NOTE — Progress Notes (Signed)
  Subjective:    Patient ID: Stephanie Barber, female    DOB: 1940-12-30, 70 y.o.   MRN: [id number]  HPI The patient comes in today for followup of her chronic cough.  At the last visit, this was felt to be upper airway in origin, and she was treated with cyclical cough protocol and also behavioral therapies.  She comes in today were her cough has improved at least 90%, and she is pleased with her result.  She is continuing her behavioral therapies as outlined last visit.   Review of Systems  Constitutional: Negative for fever and unexpected weight change.  HENT: Positive for sneezing. Negative for ear pain, nosebleeds, congestion, sore throat, rhinorrhea, trouble swallowing, dental problem, postnasal drip and sinus pressure.   Eyes: Negative for redness and itching.  Respiratory: Positive for cough. Negative for chest tightness, shortness of breath and wheezing.   Cardiovascular: Negative for palpitations and leg swelling.  Gastrointestinal: Negative for nausea and vomiting.  Genitourinary: Negative for dysuria.  Musculoskeletal: Negative for joint swelling.  Skin: Negative for rash.  Neurological: Negative for headaches.  Hematological: Does not bruise/bleed easily.  Psychiatric/Behavioral: Negative for dysphoric mood. The patient is not nervous/anxious.        Objective:   Physical Exam Overweight female in no acute distress Nose without purulence or discharge noted Lower extremities without edema, no cyanosis Alert and oriented, moves all 4 extremities.       Assessment & Plan:

## 2011-08-09 NOTE — Patient Instructions (Signed)
Stay on dexilant one each am, but can stop zantac (ranitidine) If you have any postnasal drip, can take zyrtec 10mg  each day Continue with no throat clearing, hard candy, and minimize voice use for next few weeks.  Then can go back to normal Please call if your cough escalates again.

## 2011-09-29 ENCOUNTER — Other Ambulatory Visit: Payer: Self-pay | Admitting: Gastroenterology

## 2011-10-02 ENCOUNTER — Ambulatory Visit (INDEPENDENT_AMBULATORY_CARE_PROVIDER_SITE_OTHER): Payer: Medicare Other | Admitting: Surgery

## 2011-10-02 ENCOUNTER — Encounter (INDEPENDENT_AMBULATORY_CARE_PROVIDER_SITE_OTHER): Payer: Self-pay | Admitting: Surgery

## 2011-10-02 VITALS — BP 150/70 | HR 80 | Temp 97.0°F | Resp 12 | Ht 60.0 in | Wt 137.6 lb

## 2011-10-02 DIAGNOSIS — K623 Rectal prolapse: Secondary | ICD-10-CM

## 2011-10-02 DIAGNOSIS — E739 Lactose intolerance, unspecified: Secondary | ICD-10-CM

## 2011-10-02 DIAGNOSIS — K561 Intussusception: Secondary | ICD-10-CM | POA: Insufficient documentation

## 2011-10-02 NOTE — Patient Instructions (Signed)
Rectal Prolapse, Child Prolapse means the falling down, bulging, dropping, or drooping of a part. In rectal prolapse, loose tissue near the end of the large intestine (rectum) slides downward and may bulge through the anus. A caregiver may see a reddish mass protruding from your child's anus. This condition usually happens after a bowel movement during toilet training. The bowel tissue may appear inflamed, have mucus, and bleed slightly, but children usually do not have pain. Rectal prolapse commonly occurs between age 30 to 5 years. It is often first noticed after a child begins walking. In most cases the cause is ever identified. In children rectal prolapse is usually partial. This means that only the lining of the rectum (mucosal membrane) extends outside the body through the anus. Mucosal prolapse is most common in children younger than age two. It occurs in normal infants and is usually short-lived.  CAUSES  In children, it can be an early sign of cystic fibrosis or it can be due to other problems. Prolapse may occur in the following conditions:  Constipation.   Diarrhea.   Malnutrition and malabsorption (celiac disease is an example).   Pinworms.   Injury to the anus or pelvic area.   Inflammatory bowel disease (Chron's or ulcerative colitis).   Congenital problems such as a spinal chord problem at birth (meningocele).  DIAGNOSIS  In addition to a physical exam, your doctor may perform some tests to rule out any of the previously mentioned possible causes. TREATMENT   In children, rectal prolapse usually responds to conservative treatment. This may include:   Pushing the prolapse back in. The protruding bowel must be pushed back into the rectum. As this may reoccur your doctor may teach you how to do this yourself or tell you to return if the prolapse reoccurs.   Giving your child a stool softener.   Prolapse usually is cured with treatment of the underlying condition. Underlying  conditions may include:   Constipation.   Pinworms.   If prolapse reoccurs, but no underlying condition is identified, prolapse usually stops around age 44-5 when anal sphincter tone develops more fully.   Surgical treatment may be needed if conservative treatment does not cure the problem.  Document Released: 07/29/2003 Document Revised: 07/12/2011 Document Reviewed: 06/28/2009 [facility] Patient Information 2012 [place], [place].

## 2011-10-02 NOTE — Progress Notes (Signed)
Subjective:     Patient ID: Stephanie Barber, female   DOB: 1941-11-09, 70 y.o.   MRN: [id number]  HPI  Patient Care Team: Lolita Patella as PCP - General (Family Medicine) Florencia Reasons, MD as Consulting Physician (Gastroenterology)  This patient is a 70 y.o.female who presents today for surgical evaluation.   Reason for visit: Fecal incontinence most likely due to rectal prolapse.  Patient notes she's had 3 year history of worsening fecal incontinence. She notes in the morning she is continent to flatus and stool. However, by the end of the day, she notes that 2-3 inches of rectum arel pushed out and she will have problems with continence. It used to be worse when she had a lot more diarrhea. She is adjusted her diet and to be off dairy and some fruits/and vegetables. That has helped a lot.  She is followed by Dr. Matthias Hughs with [facility]. He had sent her for biofeedback. She felt helped a little. However she kept having worsening prolapse problems. He did endoscopy and noted a few small polyps that he thinks are benign, but no other problems aside from the prolapse. Pathology is pending. Based on the prolapse, he sent the patient to [place] for evaluation.  Patient is otherwise active. She had a hysterectomy but no other abdominal surgeries. She had 4 normal spontaneous vaginal deliveries. No episiotomies. No anal tears or retinal tears that she can recall. No problems with vaginal prolapse. She notes she rarely gets any urinary incontinence to severe coughing or sneezing. No history of urine infections. No history of diarrhea or outside of lactose intolerance  Past Medical History  Diagnosis Date  . Hypertension   . Hyperlipidemia   . Reflux   . Arthritis   . Diabetes mellitus   . Chronic headache   . Heart murmur     Past Surgical History  Procedure Date  . Total abdominal hysterectomy   . Rotator cuff repair     Left  . Kidney surgery 1998    Right.  growth removed  .  Back surgery     History   Social History  . Marital Status: Married    Spouse Name: N/A    Number of Children: 4  . Years of Education: N/A   Occupational History  . 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    Social History Main Topics  . Smoking status: Former Games developer  . Smokeless tobacco: Not on file   Comment: quit in 1982  . Alcohol Use: No  . Drug Use: No  . Sexually Active: Not on file   Other Topics Concern  . Not on file   Social History Narrative  . No narrative on file    Family History  Problem Relation Age of Onset  . Heart disease Mother   . Throat cancer Father   . Cancer Father     stomach  . Kidney cancer Brother   . Heart disease Brother     Current outpatient prescriptions:amLODipine (NORVASC) 10 MG tablet, Take 10 mg by mouth daily.  , Disp: , Rfl: ;  aspirin 81 MG tablet, Take 81 mg by mouth daily.  , Disp: , Rfl: ;  budesonide-formoterol (SYMBICORT) 160-4.5 MCG/ACT inhaler, Inhale 2 puffs into the lungs 2 (two) times daily.  , Disp: , Rfl: ;  celecoxib (CELEBREX) 200 MG capsule, Take 200 mg by mouth daily.  , Disp: , Rfl:  cetirizine (ZYRTEC) 10 MG tablet, Take 10 mg  by mouth daily.  , Disp: , Rfl: ;  Cinnamon 500 MG capsule, Take 500 mg by mouth daily.  , Disp: , Rfl: ;  dexlansoprazole (DEXILANT) 60 MG capsule, Take 60 mg by mouth every morning.  , Disp: , Rfl: ;  gabapentin (NEURONTIN) 400 MG capsule, Take 800 mg by mouth at bedtime.  , Disp: , Rfl: ;  losartan (COZAAR) 100 MG tablet, Take 100 mg by mouth daily.  , Disp: , Rfl:  metFORMIN (GLUCOPHAGE) 500 MG tablet, Take 500 mg by mouth 2 (two) times daily with a meal.  , Disp: , Rfl: ;  metoprolol (TOPROL-XL) 50 MG 24 hr tablet, Take 50 mg by mouth daily.  , Disp: , Rfl: ;  pravastatin (PRAVACHOL) 40 MG tablet, Take 40 mg by mouth daily.  , Disp: , Rfl: ;  ranitidine (ZANTAC) 300 MG capsule, Take 300 mg by mouth at bedtime. , Disp: , Rfl:   Allergies  Allergen Reactions  . Hydrocodone   .  Morphine And Related        Review of Systems  Constitutional: Negative for fever, chills, diaphoresis, appetite change and fatigue.  HENT: Negative for ear pain, sore throat, trouble swallowing, neck pain and ear discharge.   Eyes: Negative for photophobia, discharge and visual disturbance.  Respiratory: Negative for cough, choking, chest tightness and shortness of breath.   Cardiovascular: Negative for chest pain and palpitations.  Gastrointestinal: Positive for diarrhea. Negative for nausea, vomiting, abdominal pain, constipation, blood in stool, abdominal distention, anal bleeding and rectal pain.       No personal nor family history of GI/colon cancer, inflammatory bowel disease, irritable bowel syndrome, allergy such as Celiac Sprue, colitis, ulcers nor gastritis.    No recent sick contacts/gastroenteritis.  No travel outside the country.  No changes in diet.    Genitourinary: Negative for dysuria, urgency, frequency, decreased urine volume, vaginal discharge, difficulty urinating, vaginal pain and pelvic pain.       Urinary incontinence very rare  Musculoskeletal: Negative for myalgias and gait problem.  Skin: Negative for color change, pallor and rash.  Neurological: Negative for dizziness, speech difficulty, weakness and numbness.  Hematological: Negative for adenopathy. Does not bruise/bleed easily.  Psychiatric/Behavioral: Negative for confusion and agitation. The patient is not nervous/anxious.        Objective:   Physical Exam  Constitutional: She is oriented to person, place, and time. She appears well-developed and well-nourished. No distress.  HENT:  Head: Normocephalic.  Mouth/Throat: Oropharynx is clear and moist. No oropharyngeal exudate.  Eyes: Conjunctivae and EOM are normal. Pupils are equal, round, and reactive to light. No scleral icterus.  Neck: Normal range of motion. Neck supple. No tracheal deviation present.  Cardiovascular: Normal rate, regular rhythm  and intact distal pulses.   Pulmonary/Chest: Effort normal and breath sounds normal. No respiratory distress. She exhibits no tenderness.  Abdominal: Soft. Bowel sounds are normal. She exhibits no distension and no mass. There is no tenderness. There is no rebound and no guarding. Hernia confirmed negative in the right inguinal area and confirmed negative in the left inguinal area.       Overweight, low midline incision  Genitourinary: No vaginal discharge found.       Perianal skin clean with good hygiene.  No pruritis.  No pilonidal disease.  No fissure.  No abscess/fistula.    Tolerates digital and anoscopic rectal exam.  Weakened but intact sphincter tone.  No weak areas.  No rectal masses.  Hemorrhoidal piles  WNL.  Prolapse of redundant rectosigmoid with Valsalva   Musculoskeletal: Normal range of motion. She exhibits no tenderness.  Lymphadenopathy:    She has no cervical adenopathy.       Right: No inguinal adenopathy present.       Left: No inguinal adenopathy present.  Neurological: She is alert and oriented to person, place, and time. No cranial nerve deficit. She exhibits normal muscle tone. Coordination normal.  Skin: Skin is warm and dry. No rash noted. She is not diaphoretic. No erythema.  Psychiatric: She has a normal mood and affect. Her behavior is normal. Judgment and thought content normal.       Assessment:     Worsening rectal prolapse causing incontinence     Plan:     I think she would benefit from surgery. She may require resection as well, however after make that decision intraoperatively. She continues with her Kegel pelvic floor exercises which I agree with.  The anatomy & physiology of the digestive tract was discussed.  The pathophysiology of rectal prolapse was discussed.  Natural history risks without surgery was discussed.   I feel the risks of no intervention will lead to serious problems that outweigh the operative risks; therefore, I recommended  surgery to treat the pathology.  Possible need for sigmoid colectomy to remove redundant colon was discussed.  Pexy by suture and probable mesh reinforcement was discussed as well.  Laparoscopic & open techniques were discussed.   Risks such as bleeding, infection, abscess, leak, reoperation, possible ostomy, hernia, heart attack, death, and other risks were discussed.   I noted a good likelihood this will help address the problem.  Goals of post-operative recovery were discussed as well.  We will work to minimize complications.  An educational handout on the technique was given as well.  Questions were answered.  The patient expresses understanding & wishes to proceed with surgery.

## 2011-10-10 ENCOUNTER — Encounter (INDEPENDENT_AMBULATORY_CARE_PROVIDER_SITE_OTHER): Payer: Self-pay | Admitting: Gastroenterology

## 2011-11-02 ENCOUNTER — Inpatient Hospital Stay: Admit: 2011-11-02 | Payer: Self-pay | Admitting: Surgery

## 2011-11-02 SURGERY — LAPAROSCOPIC PARTIAL COLECTOMY
Anesthesia: General

## 2011-11-20 ENCOUNTER — Encounter (HOSPITAL_COMMUNITY): Payer: Self-pay | Admitting: Pharmacy Technician

## 2011-11-30 ENCOUNTER — Other Ambulatory Visit: Payer: Self-pay

## 2011-11-30 ENCOUNTER — Telehealth (INDEPENDENT_AMBULATORY_CARE_PROVIDER_SITE_OTHER): Payer: Self-pay | Admitting: General Surgery

## 2011-11-30 ENCOUNTER — Encounter (HOSPITAL_COMMUNITY): Payer: Self-pay

## 2011-11-30 ENCOUNTER — Encounter (HOSPITAL_COMMUNITY)
Admission: RE | Admit: 2011-11-30 | Discharge: 2011-11-30 | Disposition: A | Discharge status: Home / Self Care | Payer: Medicare Other | Source: Ambulatory Visit | Attending: Surgery | Admitting: Surgery

## 2011-11-30 HISTORY — DX: Gastro-esophageal reflux disease without esophagitis: K21.9

## 2011-11-30 HISTORY — DX: Rectal prolapse: K62.3

## 2011-11-30 LAB — CBC
HCT: 43.2 % (ref 36.0–46.0)
Hemoglobin: 13.9 g/dL (ref 12.0–15.0)
MCH: 25.3 pg — ABNORMAL LOW (ref 26.0–34.0)
MCHC: 32.2 g/dL (ref 30.0–36.0)
MCV: 78.5 fL (ref 78.0–100.0)
RBC: 5.5 MIL/uL — ABNORMAL HIGH (ref 3.87–5.11)

## 2011-11-30 LAB — TYPE AND SCREEN
ABO/RH(D): O POS
Antibody Screen: NEGATIVE
Unit division: 0

## 2011-11-30 LAB — BASIC METABOLIC PANEL
BUN: 9 mg/dL (ref 6–23)
CO2: 25 mEq/L (ref 19–32)
Calcium: 10.7 mg/dL — ABNORMAL HIGH (ref 8.4–10.5)
Glucose, Bld: 51 mg/dL — ABNORMAL LOW (ref 70–99)
Sodium: 140 mEq/L (ref 135–145)

## 2011-11-30 LAB — SURGICAL PCR SCREEN: Staphylococcus aureus: NEGATIVE

## 2011-11-30 NOTE — Patient Instructions (Addendum)
20 Stephanie Barber  11/30/2011   Your procedure is scheduled on:  12/05/11  Report to [facility] at 5:15 AM.  Call this number if you have problems the morning of surgery: [phone]   Remember:   Do not eat food:After Midnight.  May have clear liquids:until Midnight .  Clear liquids include soda, tea, black coffee, apple or grape juice, broth.  Take these medicines the morning of surgery with A SIP OF WATER: AMLODIPINE / METOPROLOL / PRAVACHOL / ZANTAC   Do not wear jewelry, make-up or nail polish.  Do not wear lotions, powders, or perfumes. You may wear deodorant.  Do not shave 48 hours prior to surgery.  Do not bring valuables to the hospital.  Contacts, dentures or bridgework may not be worn into surgery.  Leave suitcase in the car. After surgery it may be brought to your room.  For patients admitted to the hospital, checkout time is 11:00 AM the day of discharge.   Patients discharged the day of surgery will not be allowed to drive home.  Name and phone number of your driver:   Special Instructions: CHG Shower Use Special Wash: 1/2 bottle night before surgery and 1/2 bottle morning of surgery.   Please read over the following fact sheets that you were given: Coughing and Deep Breathing, Blood Transfusion Information and MRSA Information   STOP ALL ASPIRIN AND HERBAL MEDICATIONS

## 2011-11-30 NOTE — Telephone Encounter (Signed)
Does patient need bowel prep? She was not given one. Also does patient need to stop celebrex? Thanks.

## 2011-11-30 NOTE — Telephone Encounter (Signed)
Rectal prep only is sufficient.  Thin liquids 1 day preop

## 2011-12-04 NOTE — Anesthesia Preprocedure Evaluation (Addendum)
Anesthesia Evaluation  Patient identified by MRN, date of birth, ID band Patient awake    Reviewed: Allergy & Precautions, H&P , NPO status , Patient's Chart, lab work & pertinent test results, reviewed documented beta blocker date and time   Airway Mallampati: II TM Distance: >3 FB Neck ROM: full    Dental No notable dental hx. (+) Teeth Intact and Dental Advisory Given   Pulmonary neg pulmonary ROS,  clear to auscultation  Pulmonary exam normal       Cardiovascular Exercise Tolerance: Good hypertension, On Home Beta Blockers neg cardio ROS + Valvular Problems/Murmurs regular Normal    Neuro/Psych  Headaches, Negative Neurological ROS  Negative Psych ROS   GI/Hepatic negative GI ROS, Neg liver ROS, GERD-  Medicated and Controlled,  Endo/Other  Negative Endocrine ROSDiabetes mellitus-, Well Controlled, Type 2, Oral Hypoglycemic Agents  Renal/GU negative Renal ROS  Genitourinary negative   Musculoskeletal   Abdominal   Peds  Hematology negative hematology ROS (+)   Anesthesia Other Findings   Reproductive/Obstetrics negative OB ROS                          Anesthesia Physical Anesthesia Plan  ASA: III  Anesthesia Plan: General   Post-op Pain Management:    Induction: Intravenous  Airway Management Planned: Oral ETT  Additional Equipment:   Intra-op Plan:   Post-operative Plan: Extubation in OR  Informed Consent: I have reviewed the patients History and Physical, chart, labs and discussed the procedure including the risks, benefits and alternatives for the proposed anesthesia with the patient or authorized representative who has indicated his/her understanding and acceptance.   Dental Advisory Given  Plan Discussed with: CRNA and Surgeon  Anesthesia Plan Comments:         Anesthesia Quick Evaluation

## 2011-12-05 ENCOUNTER — Encounter (HOSPITAL_COMMUNITY): Payer: Self-pay

## 2011-12-05 ENCOUNTER — Encounter (HOSPITAL_COMMUNITY)
Admission: RE | Disposition: A | Discharge status: Home / Self Care | Payer: Self-pay | Source: Ambulatory Visit | Attending: Surgery

## 2011-12-05 ENCOUNTER — Inpatient Hospital Stay (HOSPITAL_COMMUNITY): Payer: Medicare Other | Admitting: Anesthesiology

## 2011-12-05 ENCOUNTER — Encounter (HOSPITAL_COMMUNITY): Payer: Self-pay | Admitting: Anesthesiology

## 2011-12-05 ENCOUNTER — Inpatient Hospital Stay (HOSPITAL_COMMUNITY)
Admission: RE | Admit: 2011-12-05 | Discharge: 2011-12-08 | DRG: 330 | Disposition: A | Discharge status: Home / Self Care | Payer: Medicare Other | Source: Ambulatory Visit | Attending: Surgery | Admitting: Surgery

## 2011-12-05 ENCOUNTER — Encounter (HOSPITAL_COMMUNITY): Payer: Self-pay | Admitting: Surgery

## 2011-12-05 ENCOUNTER — Other Ambulatory Visit (INDEPENDENT_AMBULATORY_CARE_PROVIDER_SITE_OTHER): Payer: Self-pay | Admitting: Surgery

## 2011-12-05 DIAGNOSIS — Z01812 Encounter for preprocedural laboratory examination: Secondary | ICD-10-CM

## 2011-12-05 DIAGNOSIS — Z87891 Personal history of nicotine dependence: Secondary | ICD-10-CM

## 2011-12-05 DIAGNOSIS — IMO0002 Reserved for concepts with insufficient information to code with codable children: Secondary | ICD-10-CM | POA: Diagnosis not present

## 2011-12-05 DIAGNOSIS — E739 Lactose intolerance, unspecified: Secondary | ICD-10-CM | POA: Diagnosis present

## 2011-12-05 DIAGNOSIS — M129 Arthropathy, unspecified: Secondary | ICD-10-CM | POA: Diagnosis present

## 2011-12-05 DIAGNOSIS — K561 Intussusception: Secondary | ICD-10-CM | POA: Diagnosis present

## 2011-12-05 DIAGNOSIS — K623 Rectal prolapse: Secondary | ICD-10-CM

## 2011-12-05 DIAGNOSIS — Y838 Other surgical procedures as the cause of abnormal reaction of the patient, or of later complication, without mention of misadventure at the time of the procedure: Secondary | ICD-10-CM | POA: Diagnosis not present

## 2011-12-05 DIAGNOSIS — IMO0001 Reserved for inherently not codable concepts without codable children: Secondary | ICD-10-CM | POA: Insufficient documentation

## 2011-12-05 DIAGNOSIS — K660 Peritoneal adhesions (postprocedural) (postinfection): Secondary | ICD-10-CM | POA: Diagnosis present

## 2011-12-05 DIAGNOSIS — E785 Hyperlipidemia, unspecified: Secondary | ICD-10-CM | POA: Diagnosis present

## 2011-12-05 DIAGNOSIS — R011 Cardiac murmur, unspecified: Secondary | ICD-10-CM | POA: Diagnosis present

## 2011-12-05 DIAGNOSIS — E1159 Type 2 diabetes mellitus with other circulatory complications: Secondary | ICD-10-CM

## 2011-12-05 DIAGNOSIS — I10 Essential (primary) hypertension: Secondary | ICD-10-CM | POA: Diagnosis present

## 2011-12-05 DIAGNOSIS — Z01810 Encounter for preprocedural cardiovascular examination: Secondary | ICD-10-CM

## 2011-12-05 DIAGNOSIS — R159 Full incontinence of feces: Secondary | ICD-10-CM | POA: Diagnosis present

## 2011-12-05 DIAGNOSIS — K219 Gastro-esophageal reflux disease without esophagitis: Secondary | ICD-10-CM | POA: Diagnosis present

## 2011-12-05 DIAGNOSIS — I152 Hypertension secondary to endocrine disorders: Secondary | ICD-10-CM

## 2011-12-05 DIAGNOSIS — E119 Type 2 diabetes mellitus without complications: Secondary | ICD-10-CM

## 2011-12-05 DIAGNOSIS — K5730 Diverticulosis of large intestine without perforation or abscess without bleeding: Secondary | ICD-10-CM

## 2011-12-05 HISTORY — PX: OTHER SURGICAL HISTORY: SHX169

## 2011-12-05 LAB — GLUCOSE, CAPILLARY
Glucose-Capillary: 113 mg/dL — ABNORMAL HIGH (ref 70–99)
Glucose-Capillary: 186 mg/dL — ABNORMAL HIGH (ref 70–99)
Glucose-Capillary: 184 mg/dL — ABNORMAL HIGH (ref 70–99)
Glucose-Capillary: 226 mg/dL — ABNORMAL HIGH (ref 70–99)
Glucose-Capillary: 192 mg/dL — ABNORMAL HIGH (ref 70–99)

## 2011-12-05 LAB — CBC
HCT: 32.5 % — ABNORMAL LOW (ref 36.0–46.0)
Hemoglobin: 10.4 g/dL — ABNORMAL LOW (ref 12.0–15.0)
RBC: 4.13 MIL/uL (ref 3.87–5.11)
WBC: 14.3 10*3/uL — ABNORMAL HIGH (ref 4.0–10.5)

## 2011-12-05 LAB — CREATININE, SERUM
GFR calc Af Amer: >90 mL/min (ref >90)
GFR calc non Af Amer: 83 mL/min — ABNORMAL LOW (ref >90)

## 2011-12-05 SURGERY — LAPAROSCOPIC PARTIAL COLECTOMY
Anesthesia: General | Wound class: Clean Contaminated

## 2011-12-05 MED ORDER — KETOROLAC TROMETHAMINE 30 MG/ML IJ SOLN
INTRAMUSCULAR | Status: DC | PRN
  Administered 2011-12-05: 30 mg via INTRAVENOUS

## 2011-12-05 MED ORDER — STERILE WATER FOR IRRIGATION IR SOLN
Status: DC | PRN
  Administered 2011-12-05: 1500 mL

## 2011-12-05 MED ORDER — PSYLLIUM 95 % PO PACK
1.0000 | PACK | Freq: Two times a day (BID) | ORAL | Status: DC
  Administered 2011-12-05 – 2011-12-07 (×5): 1 via ORAL
  Filled 2011-12-05 (×8): qty 1

## 2011-12-05 MED ORDER — LACTATED RINGERS IR SOLN
Status: DC | PRN
  Administered 2011-12-05: 3000 mL

## 2011-12-05 MED ORDER — ALVIMOPAN 12 MG PO CAPS
12.0000 mg | ORAL_CAPSULE | Freq: Two times a day (BID) | ORAL | Status: DC
  Administered 2011-12-06 – 2011-12-07 (×4): 12 mg via ORAL
  Filled 2011-12-05 (×6): qty 1

## 2011-12-05 MED ORDER — HETASTARCH-ELECTROLYTES 6 % IV SOLN
INTRAVENOUS | Status: DC | PRN
  Administered 2011-12-05: 08:00:00 via INTRAVENOUS

## 2011-12-05 MED ORDER — INSULIN ASPART 100 UNIT/ML ~~LOC~~ SOLN: 0.0000 [IU] | Freq: Every day | SUBCUTANEOUS | Status: DC

## 2011-12-05 MED ORDER — ESMOLOL HCL 10 MG/ML IV SOLN
INTRAVENOUS | Status: DC | PRN
  Administered 2011-12-05: 20 mg via INTRAVENOUS
  Administered 2011-12-05: 10 mg via INTRAVENOUS
  Administered 2011-12-05: 20 mg via INTRAVENOUS

## 2011-12-05 MED ORDER — ONDANSETRON HCL 4 MG/2ML IJ SOLN: 4.0000 mg | Freq: Four times a day (QID) | INTRAMUSCULAR | Status: DC | PRN

## 2011-12-05 MED ORDER — ACETAMINOPHEN 10 MG/ML IV SOLN
INTRAVENOUS | Status: AC
  Filled 2011-12-05: qty 100

## 2011-12-05 MED ORDER — ROCURONIUM BROMIDE 100 MG/10ML IV SOLN
INTRAVENOUS | Status: DC | PRN
  Administered 2011-12-05: 50 mg via INTRAVENOUS
  Administered 2011-12-05: 5 mg via INTRAVENOUS
  Administered 2011-12-05: 10 mg via INTRAVENOUS
  Administered 2011-12-05: 5 mg via INTRAVENOUS

## 2011-12-05 MED ORDER — DEXAMETHASONE SODIUM PHOSPHATE 10 MG/ML IJ SOLN
INTRAMUSCULAR | Status: DC | PRN
  Administered 2011-12-05: 10 mg via INTRAVENOUS

## 2011-12-05 MED ORDER — METOPROLOL TARTRATE 12.5 MG HALF TABLET
12.5000 mg | ORAL_TABLET | Freq: Two times a day (BID) | ORAL | Status: DC
  Filled 2011-12-05 (×2): qty 1

## 2011-12-05 MED ORDER — FLORA-Q PO CAPS
1.0000 | ORAL_CAPSULE | Freq: Every day | ORAL | Status: DC
  Administered 2011-12-05 – 2011-12-07 (×3): 1 via ORAL
  Filled 2011-12-05 (×4): qty 1

## 2011-12-05 MED ORDER — FENTANYL CITRATE 0.05 MG/ML IJ SOLN
INTRAMUSCULAR | Status: DC | PRN
  Administered 2011-12-05 (×3): 50 ug via INTRAVENOUS
  Administered 2011-12-05: 100 ug via INTRAVENOUS
  Administered 2011-12-05: 50 ug via INTRAVENOUS
  Administered 2011-12-05: 100 ug via INTRAVENOUS
  Administered 2011-12-05 (×4): 50 ug via INTRAVENOUS

## 2011-12-05 MED ORDER — ALBUTEROL SULFATE (5 MG/ML) 0.5% IN NEBU: 2.5000 mg | INHALATION_SOLUTION | Freq: Four times a day (QID) | RESPIRATORY_TRACT | Status: DC | PRN

## 2011-12-05 MED ORDER — ONDANSETRON HCL 4 MG PO TABS: 4.0000 mg | ORAL_TABLET | Freq: Four times a day (QID) | ORAL | Status: DC | PRN

## 2011-12-05 MED ORDER — GLYCOPYRROLATE 0.2 MG/ML IJ SOLN
INTRAMUSCULAR | Status: DC | PRN
  Administered 2011-12-05: .4 mg via INTRAVENOUS

## 2011-12-05 MED ORDER — DIPHENHYDRAMINE HCL 50 MG/ML IJ SOLN: 12.5000 mg | Freq: Four times a day (QID) | INTRAMUSCULAR | Status: DC | PRN

## 2011-12-05 MED ORDER — LACTATED RINGERS IV SOLN
INTRAVENOUS | Status: DC
Start: 2011-12-05 — End: 2011-12-05
  Administered 2011-12-05: 13:00:00 via INTRAVENOUS

## 2011-12-05 MED ORDER — NALOXONE NEWBORN-WH INJECTION 0.4 MG/ML
INTRAMUSCULAR | Status: DC | PRN
  Administered 2011-12-05: .04 mg via INTRAMUSCULAR

## 2011-12-05 MED ORDER — SODIUM CHLORIDE 0.9 % IV SOLN
1.0000 g | INTRAVENOUS | Status: DC
  Filled 2011-12-05: qty 1

## 2011-12-05 MED ORDER — ACETAMINOPHEN 500 MG PO TABS
500.0000 mg | ORAL_TABLET | Freq: Four times a day (QID) | ORAL | Status: DC | PRN
  Administered 2011-12-06 – 2011-12-08 (×2): 1000 mg via ORAL
  Filled 2011-12-05: qty 2

## 2011-12-05 MED ORDER — AMLODIPINE BESYLATE 10 MG PO TABS
10.0000 mg | ORAL_TABLET | Freq: Every day | ORAL | Status: DC
  Filled 2011-12-05: qty 1

## 2011-12-05 MED ORDER — FAMOTIDINE 20 MG PO TABS
20.0000 mg | ORAL_TABLET | Freq: Two times a day (BID) | ORAL | Status: DC
  Administered 2011-12-05 – 2011-12-07 (×5): 20 mg via ORAL
  Filled 2011-12-05 (×7): qty 1

## 2011-12-05 MED ORDER — PROMETHAZINE HCL 25 MG/ML IJ SOLN: 6.2500 mg | INTRAMUSCULAR | Status: DC | PRN

## 2011-12-05 MED ORDER — INSULIN ASPART 100 UNIT/ML ~~LOC~~ SOLN
0.0000 [IU] | Freq: Three times a day (TID) | SUBCUTANEOUS | Status: DC
  Administered 2011-12-05: 5 [IU] via SUBCUTANEOUS
  Administered 2011-12-06 – 2011-12-08 (×3): 2 [IU] via SUBCUTANEOUS
  Filled 2011-12-05: qty 3

## 2011-12-05 MED ORDER — SODIUM CHLORIDE 0.9 % IV SOLN
1.0000 g | INTRAVENOUS | Status: DC | PRN
  Administered 2011-12-05: 1 g via INTRAVENOUS

## 2011-12-05 MED ORDER — ASPIRIN 81 MG PO CHEW
81.0000 mg | CHEWABLE_TABLET | Freq: Every day | ORAL | Status: DC
  Administered 2011-12-05 – 2011-12-07 (×3): 81 mg via ORAL
  Filled 2011-12-05 (×4): qty 1

## 2011-12-05 MED ORDER — DIPHENHYDRAMINE HCL 50 MG/ML IJ SOLN
INTRAMUSCULAR | Status: DC | PRN
  Administered 2011-12-05: 12.5 mg via INTRAVENOUS

## 2011-12-05 MED ORDER — PROPOFOL 10 MG/ML IV BOLUS
INTRAVENOUS | Status: DC | PRN
  Administered 2011-12-05: 200 mg via INTRAVENOUS

## 2011-12-05 MED ORDER — LACTATED RINGERS IV SOLN
INTRAVENOUS | Status: DC
  Administered 2011-12-05 – 2011-12-06 (×2): via INTRAVENOUS
  Administered 2011-12-07: 1000 mL/h via INTRAVENOUS

## 2011-12-05 MED ORDER — HEPARIN SODIUM (PORCINE) 5000 UNIT/ML IJ SOLN
5000.0000 [IU] | Freq: Three times a day (TID) | INTRAMUSCULAR | Status: DC
  Filled 2011-12-05 (×4): qty 1

## 2011-12-05 MED ORDER — LACTATED RINGERS IV SOLN
INTRAVENOUS | Status: DC | PRN
Start: 2011-12-05 — End: 2011-12-05
  Administered 2011-12-05 (×3): via INTRAVENOUS

## 2011-12-05 MED ORDER — ACETAMINOPHEN 10 MG/ML IV SOLN
INTRAVENOUS | Status: DC | PRN
  Administered 2011-12-05: 1000 mg via INTRAVENOUS

## 2011-12-05 MED ORDER — SODIUM CHLORIDE 0.9 % IV SOLN
INTRAVENOUS | Status: AC
  Filled 2011-12-05: qty 50

## 2011-12-05 MED ORDER — HYDROMORPHONE HCL PF 1 MG/ML IJ SOLN
INTRAMUSCULAR | Status: DC | PRN
  Administered 2011-12-05: 1 mg via INTRAVENOUS

## 2011-12-05 MED ORDER — HYDROMORPHONE HCL PF 1 MG/ML IJ SOLN
INTRAMUSCULAR | Status: AC
  Filled 2011-12-05: qty 1

## 2011-12-05 MED ORDER — METOPROLOL SUCCINATE ER 50 MG PO TB24
50.0000 mg | ORAL_TABLET | Freq: Every day | ORAL | Status: AC
  Administered 2011-12-05: 50 mg via ORAL
  Filled 2011-12-05: qty 1

## 2011-12-05 MED ORDER — ONDANSETRON 8 MG/NS 50 ML IVPB
8.0000 mg | Freq: Four times a day (QID) | INTRAVENOUS | Status: DC | PRN
  Filled 2011-12-05: qty 8

## 2011-12-05 MED ORDER — GUAIFENESIN-DM 100-10 MG/5ML PO SYRP: 15.0000 mL | ORAL_SOLUTION | ORAL | Status: DC | PRN

## 2011-12-05 MED ORDER — HEPARIN SODIUM (PORCINE) 5000 UNIT/ML IJ SOLN
5000.0000 [IU] | Freq: Once | INTRAMUSCULAR | Status: AC
  Administered 2011-12-05: 5000 [IU] via SUBCUTANEOUS

## 2011-12-05 MED ORDER — POLYSACCHARIDE IRON 150 MG PO CAPS
150.0000 mg | ORAL_CAPSULE | Freq: Every day | ORAL | Status: DC
  Administered 2011-12-05 – 2011-12-07 (×3): 150 mg via ORAL
  Filled 2011-12-05 (×5): qty 1

## 2011-12-05 MED ORDER — HYDROCORTISONE ACE-PRAMOXINE 2.5-1 % RE CREA
1.0000 "application " | TOPICAL_CREAM | Freq: Four times a day (QID) | RECTAL | Status: DC | PRN
  Filled 2011-12-05: qty 30

## 2011-12-05 MED ORDER — PROMETHAZINE HCL 25 MG/ML IJ SOLN: 12.5000 mg | Freq: Four times a day (QID) | INTRAMUSCULAR | Status: DC | PRN

## 2011-12-05 MED ORDER — ALVIMOPAN 12 MG PO CAPS
12.0000 mg | ORAL_CAPSULE | Freq: Once | ORAL | Status: AC
  Administered 2011-12-05: 12 mg via ORAL

## 2011-12-05 MED ORDER — METOPROLOL TARTRATE 1 MG/ML IV SOLN
5.0000 mg | Freq: Four times a day (QID) | INTRAVENOUS | Status: DC | PRN
  Administered 2011-12-07: 5 mg via INTRAVENOUS
  Filled 2011-12-05: qty 5

## 2011-12-05 MED ORDER — GABAPENTIN 400 MG PO CAPS
400.0000 mg | ORAL_CAPSULE | Freq: Every day | ORAL | Status: DC
  Administered 2011-12-05 – 2011-12-06 (×4): 400 mg via ORAL
  Administered 2011-12-07: 800 mg via ORAL
  Filled 2011-12-05 (×5): qty 2

## 2011-12-05 MED ORDER — HYDROMORPHONE HCL PF 1 MG/ML IJ SOLN
0.2500 mg | INTRAMUSCULAR | Status: DC | PRN
  Administered 2011-12-05 (×4): 0.25 mg via INTRAVENOUS

## 2011-12-05 MED ORDER — EPHEDRINE SULFATE 50 MG/ML IJ SOLN
INTRAMUSCULAR | Status: DC | PRN
  Administered 2011-12-05 (×2): 5 mg via INTRAVENOUS

## 2011-12-05 MED ORDER — NEOSTIGMINE METHYLSULFATE 1 MG/ML IJ SOLN
INTRAMUSCULAR | Status: DC | PRN
  Administered 2011-12-05: 3 mg via INTRAVENOUS

## 2011-12-05 MED ORDER — HYDROMORPHONE BOLUS VIA INFUSION
0.5000 mg | INTRAVENOUS | Status: DC | PRN
  Filled 2011-12-05: qty 2

## 2011-12-05 MED ORDER — CELECOXIB 200 MG PO CAPS
200.0000 mg | ORAL_CAPSULE | Freq: Every day | ORAL | Status: DC
  Administered 2011-12-05: 200 mg via ORAL
  Filled 2011-12-05 (×2): qty 1

## 2011-12-05 SURGICAL SUPPLY — 89 items
APPLIER CLIP 5 13 M/L LIGAMAX5 (MISCELLANEOUS)
APPLIER CLIP ROT 10 11.4 M/L (STAPLE)
APR CLP MED LRG 11.4X10 (STAPLE)
APR CLP MED LRG 5 ANG JAW (MISCELLANEOUS)
BLADE EXTENDED COATED 6.5IN (ELECTRODE) ×2 IMPLANT
BLADE HEX COATED 2.75 (ELECTRODE) ×2 IMPLANT
BLADE SURG SZ10 CARB STEEL (BLADE) ×2 IMPLANT
CABLE HIGH FREQUENCY MONO STRZ (ELECTRODE) ×2 IMPLANT
CANISTER SUCTION 2500CC (MISCELLANEOUS) ×3 IMPLANT
CATH KIT ON Q 5IN DUAL SLV (PAIN MANAGEMENT) ×1 IMPLANT
CELLS DAT CNTRL 66122 CELL SVR (MISCELLANEOUS) IMPLANT
CLIP APPLIE 5 13 M/L LIGAMAX5 (MISCELLANEOUS) IMPLANT
CLIP APPLIE ROT 10 11.4 M/L (STAPLE) IMPLANT
CLOTH BEACON ORANGE TIMEOUT ST (SAFETY) ×2 IMPLANT
COVER MAYO STAND STRL (DRAPES) ×2 IMPLANT
DECANTER SPIKE VIAL GLASS SM (MISCELLANEOUS) ×1 IMPLANT
DRAIN CHANNEL 19F RND (DRAIN) IMPLANT
DRAPE LAPAROSCOPIC ABDOMINAL (DRAPES) ×2 IMPLANT
DRAPE LG THREE QUARTER DISP (DRAPES) ×2 IMPLANT
DRAPE UTILITY 15X26 (DRAPE) ×4 IMPLANT
DRAPE WARM FLUID 44X44 (DRAPE) ×3 IMPLANT
DRSG TEGADERM 2-3/8X2-3/4 SM (GAUZE/BANDAGES/DRESSINGS) ×4 IMPLANT
DRSG TEGADERM 4X4.75 (GAUZE/BANDAGES/DRESSINGS) ×2 IMPLANT
ELECT REM PT RETURN 9FT ADLT (ELECTROSURGICAL) ×2
ELECTRODE REM PT RTRN 9FT ADLT (ELECTROSURGICAL) ×1 IMPLANT
FILTER SMOKE EVAC LAPAROSHD (FILTER) IMPLANT
GAUZE SPONGE 2X2 8PLY STRL LF (GAUZE/BANDAGES/DRESSINGS) IMPLANT
GLOVE BIOGEL PI IND STRL 7.0 (GLOVE) ×1 IMPLANT
GLOVE BIOGEL PI INDICATOR 7.0 (GLOVE) ×1
GLOVE ECLIPSE 8.0 STRL XLNG CF (GLOVE) ×4 IMPLANT
GLOVE INDICATOR 8.0 STRL GRN (GLOVE) ×2 IMPLANT
GOWN STRL NON-REIN LRG LVL3 (GOWN DISPOSABLE) ×2 IMPLANT
GOWN STRL REIN XL XLG (GOWN DISPOSABLE) ×4 IMPLANT
GRAFT FLEX HD 10X16 THICK (Tissue Mesh) ×1 IMPLANT
HAND ACTIVATED (MISCELLANEOUS) IMPLANT
KIT BASIN OR (CUSTOM PROCEDURE TRAY) ×2 IMPLANT
LEGGING LITHOTOMY PAIR STRL (DRAPES) ×1 IMPLANT
LIGASURE IMPACT 36 18CM CVD LR (INSTRUMENTS) IMPLANT
NS IRRIG 1000ML POUR BTL (IV SOLUTION) ×2 IMPLANT
PENCIL BUTTON HOLSTER BLD 10FT (ELECTRODE) ×2 IMPLANT
RETRACTOR WND ALEXIS 18 MED (MISCELLANEOUS) IMPLANT
RTRCTR WOUND ALEXIS 18CM MED (MISCELLANEOUS)
SCISSORS LAP 5X35 DISP (ENDOMECHANICALS) ×2 IMPLANT
SEALER TISSUE G2 CVD JAW 35 (ENDOMECHANICALS) IMPLANT
SEALER TISSUE G2 CVD JAW 45CM (ENDOMECHANICALS) ×1
SET IRRIG TUBING LAPAROSCOPIC (IRRIGATION / IRRIGATOR) ×2 IMPLANT
SLEEVE Z-THREAD 5X100MM (TROCAR) ×4 IMPLANT
SPONGE GAUZE 2X2 STER 10/PKG (GAUZE/BANDAGES/DRESSINGS) ×1
SPONGE GAUZE 4X4 12PLY (GAUZE/BANDAGES/DRESSINGS) ×2 IMPLANT
SPONGE LAP 18X18 X RAY DECT (DISPOSABLE) ×4 IMPLANT
STAPLER CIRC CVD 29MM 37CM (STAPLE) ×1 IMPLANT
STAPLER CUT CVD 40MM BLUE (STAPLE) ×1 IMPLANT
STAPLER VISISTAT 35W (STAPLE) ×2 IMPLANT
SUCTION POOLE TIP (SUCTIONS) ×2 IMPLANT
SUT ETHILON 2 0 PS N (SUTURE) IMPLANT
SUT MNCRL 0 MO-4 VIOLET 18 CR (SUTURE) ×2 IMPLANT
SUT MNCRL AB 4-0 PS2 18 (SUTURE) ×2 IMPLANT
SUT MONOCRYL 0 MO 4 18  CR/8 (SUTURE) ×1
SUT PDS AB 1 CTX 36 (SUTURE) ×2 IMPLANT
SUT PDS AB 1 TP1 96 (SUTURE) ×1 IMPLANT
SUT PDS AB 2-0 CT2 27 (SUTURE) ×6 IMPLANT
SUT PROLENE 0 CT 2 (SUTURE) ×1 IMPLANT
SUT PROLENE 2 0 CT2 30 (SUTURE) ×1 IMPLANT
SUT PROLENE 2 0 KS (SUTURE) IMPLANT
SUT SILK 2 0 (SUTURE) ×2
SUT SILK 2 0 SH CR/8 (SUTURE) ×2 IMPLANT
SUT SILK 2-0 18XBRD TIE 12 (SUTURE) ×1 IMPLANT
SUT SILK 3 0 (SUTURE) ×2
SUT SILK 3 0 SH CR/8 (SUTURE) ×2 IMPLANT
SUT SILK 3-0 18XBRD TIE 12 (SUTURE) ×1 IMPLANT
SUT VIC AB 2-0 SH 18 (SUTURE) IMPLANT
SUT VICRYL 2 0 18  UND BR (SUTURE)
SUT VICRYL 2 0 18 UND BR (SUTURE) IMPLANT
SYS LAPSCP GELPORT 120MM (MISCELLANEOUS) ×2
SYSTEM LAPSCP GELPORT 120MM (MISCELLANEOUS) IMPLANT
TACKER 5MM HERNIA 3.5CML NAB (ENDOMECHANICALS) ×1 IMPLANT
TAPE UMBILICAL COTTON 1/8X30 (MISCELLANEOUS) ×2 IMPLANT
TOWEL OR 17X26 10 PK STRL BLUE (TOWEL DISPOSABLE) ×4 IMPLANT
TOWEL OR NON WOVEN STRL DISP B (DISPOSABLE) ×4 IMPLANT
TRAY FOLEY CATH 14FRSI W/METER (CATHETERS) ×2 IMPLANT
TRAY LAP CHOLE (CUSTOM PROCEDURE TRAY) ×2 IMPLANT
TROCAR XCEL NON-BLD 5MMX100MML (ENDOMECHANICALS) ×2 IMPLANT
TROCAR Z-THREAD FIOS 11X100 BL (TROCAR) ×1 IMPLANT
TROCAR Z-THREAD FIOS 5X100MM (TROCAR) ×3 IMPLANT
TROCAR Z-THREAD SLEEVE 11X100 (TROCAR) IMPLANT
TUBING FILTER THERMOFLATOR (ELECTROSURGICAL) ×2 IMPLANT
TUBING INSUFFLATION 10FT LAP (TUBING) ×1 IMPLANT
YANKAUER SUCT BULB TIP 10FT TU (MISCELLANEOUS) ×2 IMPLANT
YANKAUER SUCT BULB TIP NO VENT (SUCTIONS) ×2 IMPLANT

## 2011-12-05 NOTE — Anesthesia Postprocedure Evaluation (Signed)
  Anesthesia Post-op Note  Patient: Stephanie Barber  Procedure(s) Performed:  LAPAROSCOPIC PARTIAL COLECTOMY - Laparoscopic Low Anterior resection and Rectopexy with biologic Mesh  Patient Location: PACU  Anesthesia Type: General  Level of Consciousness: awake and alert   Airway and Oxygen Therapy: Patient Spontanous Breathing  Post-op Pain: mild  Post-op Assessment: Post-op Vital signs reviewed, Patient's Cardiovascular Status Stable, Respiratory Function Stable, Patent Airway and No signs of Nausea or vomiting  Post-op Vital Signs: stable  Complications: No apparent anesthesia complications

## 2011-12-05 NOTE — Brief Op Note (Signed)
12/05/2011  11:54 AM  PATIENT:  Stephanie Barber  71 y.o. female  Patient Care Team: Lolita Patella, MD as PCP - General (Family Medicine) Florencia Reasons, MD as Consulting Physician (Gastroenterology) Barbaraann Share, MD as Consulting Physician (Pulmonary Disease)  PRE-OPERATIVE DIAGNOSIS:  rectal prolapse   POST-OPERATIVE DIAGNOSIS:  rectal prolapse   PROCEDURE:  Procedure(s): LAPAROSCOPIC LOA Lap assisted LAR LAp rectopexy w biologic mesh Rigid proctoscopy  SURGEON:  Surgeon(s): Ardeth Sportsman, MD  PHYSICIAN ASSISTANT:   ASSISTANTS: none   ANESTHESIA:   general  EBL:  Total I/O In: 2500 [I.V.:2000; IV Piggyback:500] Out: 480 [Urine:380; Blood:100]  BLOOD ADMINISTERED:none  DRAINS: none   LOCAL MEDICATIONS USED:  NONE  SPECIMEN:  Source of Specimen:  rectosigmoid  DISPOSITION OF SPECIMEN:  PATHOLOGY  COUNTS:  YES  TOURNIQUET:  * No tourniquets in log *  DICTATION: .Other Dictation: Dictation Number [id number]  PLAN OF CARE: Admit to inpatient   PATIENT DISPOSITION:  PACU - hemodynamically stable.   Delay start of Pharmacological VTE agent (>24hrs) due to surgical blood loss or risk of bleeding:  {YES/NO/NOT APPLICABLE:20182

## 2011-12-05 NOTE — Preoperative (Signed)
Beta Blockers   Reason not to administer Beta Blockers:Not Applicable, pt took BB this am at0630

## 2011-12-05 NOTE — Transfer of Care (Signed)
Immediate Anesthesia Transfer of Care Note  Patient: Stephanie Barber  Procedure(s) Performed:  LAPAROSCOPIC PARTIAL COLECTOMY - Laparoscopic Low Anterior resection and Rectopexy with biologic Mesh  Patient Location: PACU  Anesthesia Type: General  Level of Consciousness: sedated and responds to stimulation  Airway & Oxygen Therapy: Patient Spontanous Breathing  Post-op Assessment: Report given to PACU RN, Post -op Vital signs reviewed and stable and Patient moving all extremities  Post vital signs: Reviewed and stable  Complications: No apparent anesthesia complications

## 2011-12-05 NOTE — Op Note (Signed)
NAMEMarland Kitchen  Stephanie Barber, Stephanie Barber NO.:  [id number]  MEDICAL RECORD NO.:  [id number]  LOCATION:  1526                         FACILITY:  [facility]  PHYSICIAN:  Ardeth Sportsman, MD     DATE OF BIRTH:  06/07/1941  DATE OF PROCEDURE: DATE OF DISCHARGE:                              OPERATIVE REPORT   PRIMARY CARE PHYSICIAN:  Robert A. Nicholos Johns, M.D.  GASTROLOGIST:  Bernette Redbird, M.D.  SURGEON:  Ardeth Sportsman, MD.  ASSIST:  RN.  PREOPERATIVE DIAGNOSIS:  Rectal prolapse with intermittent fecal incontinence.  POSTOPERATIVE DIAGNOSIS:  Rectal prolapse with intermittent fecal incontinence.  PROCEDURES PERFORMED: 1. Laparoscopic lysis x45 minutes (equals a third of the case). 2. Laparoscopically assisted low anterior resection with stapled     anastomosis. 3. Laparoscopic rectopexy with biologic mesh and suture. 4. Rigid proctoscopy.  ANESTHESIA:  General anesthesia.  No local anesthetic use given the questionable patient allergy to local anesthetics.  SPECIMENS:  Rectosigmoid with anastomotic rings.  Open end is proximal.  ESTIMATED BLOOD LOSS:  150 mL.  COMPLICATIONS:  Some mild rectal bleeding at the end, but stable. Otherwise negative.  DRAINS:  None.  INDICATIONS:  Stephanie Barber is a 71 year old female, overweight, who has had problems with worsening rectal prolapse to the point of having some incontinence.  She has tried Kegel pelvic floor exercises.  She has had a bowel regimen to control her diarrhea better, but she has still had prolapse.  She was sent to me for surgical evaluation.  She did have a circumferential prolapse.  Pathophysiology of rectal prolapse was discussed.  Options were discussed.  Recommendation was made for pexy with mesh and/or suturing. Probable removal of redundant rectosigmoid was discussed as well.  Risks such as bleeding, stroke heart attack, death, anastomotic leak, abscess with infection and other risks were discussed in detail.   Questions were answered.  She agreed to proceed.  OPERATIVE FINDINGS:  She had a mildly redundant but corkscrew shaped sigmoid colon with a very relaxed open rectum and pelvis.  The anastomosis is a 29 EEA stapled anastomosis.  The anastomosis at 12 cm from the anal verge.  DESCRIPTION OF PROCEDURE:  Informed consent was confirmed.  The patient was placed on the anti-ileus protocol.  She received subcutaneous heparin and IV antibiotics prior to incision.  She had sequential compression devices active during the entire case.  She was positioned in lower lithotomy with arms tucked.  Surgical time-out confirmed our plan.  I had placed a #5 mm port in the left upper quadrant using optical entry technique with the patient in reverse Trendelenburg  and right side up. Entry was clean.  I induced carbon dioxide insufflation.  Camera inspection revealed no injury.  I placed 5 mm ports in the right flank and right lower quadrant.  We proceeded with freeing off the adhesions from her prior left upper quadrant and midline incisions, primarily of omentum and some colon.  I primarily used cold sharp scissors with some occasional focussed bipolar ENSEAL.  With that, I was able to mobilize the greater omentum and help reflect that in the upper abdomen along with the transverse colon.  I mobilized the terminal ileum off its  retroperitoneal attachments and using that hand off some interloop adhesions to help mobilize the small bowel out of the pelvis as well.  I could see a moderately redundant sigmoid, sort of corkscrew shaped, stuck in the pelvis.  I did free off adhesions to the anterior abdominal wall, a little bit on the anterior pelvic sidewall to help straighten it out.  She had a mildly shortened sigmoid mesentery, but redundant accordion like fold of sigmoid that continuously flopped down into the pelvis.  I scored the peritoneum from the sigmoid mesentery at the base of the inferior  mesenteric artery.  I then followed that down to the peritoneal reflection on the right side.  I was able to get posterior to the distal sigmoid mesentery.  I could see the left ureter and gonadal vessels and kept those posterior.  I followed it and freed the mesorectum off its avascular attachments down to the sacrum all the way down to the coccyx.  I then mobilized the peritoneal coverings on the left mesorectum down to the peritoneal reflection.  I then freed the anterior rectum off its attachments to the rectovaginal septum very distally.  With that, I could mobilize several cm of rectum above the peritoneal reflection to help straighten it out.  I took care to preserve the lateral stalks. There was some mild ooziness, but hemostasis me able to be controlled.  I purposely left the attachments to the descending colon and proximal sigmoid colon alone as an anchor point.  I went ahead and used a biologic mesh 10 x 15 cm and cut it to a smaller size, to about 7 x 12 cm.  I placed it in position appropriately.  I placed 2-0 PDS stitches, 3 on each side on the longitudinal end of the rectangle.  I left that aside.  I then proceeded with rectosigmoid resection of redundant sigmoid.  I chose a point in the proximal rectum that brought up well, it looked healthy.  I skeletonized the mesorectum off it taking care to preserve the mesorectum and the lateral side branches distal to that.  I then found a hit in the proximal sigmoid colon.  I then easily reached down to the sacral promontory where the kidneys would naturally meat.  I made a window in the mesentery as well there.  I went ahead and placed a gel port as a wound extraction site over a 6 cm incision using a Pfannenstiel incision.  I was able to bring up the redundant colon.  I transected at the rectosigmoid junction using a contour stapler and brought that up.  I took the rectosigmoid off its mesentery staying close to the bowel using  bipolar ENSEAL system until I came to the natural window near the proximal sigmoid.  I placed a soft bowel clamp and transected with a scalpel in the proximal sigmoid colon until I had healthy bleeding mucosa.  I placed a 0 Prolene pursestring stitch there. With sizers I put in a 29 EEA anvil and tied the pursestring stitch down on the anvil.  I returned to the pelvis and ensured hemostasis.  I went ahead and placed the mesh with the stitches that had been placed before and placed that down onto the sacrum.  I used a ProTack to help place some spiral tacks on the flex HD biologic mesh and two on the sacrum along the midline up towards the sacral promontory.  I went ahead and tied the stitches on the side wall of the mesh  to the lateral mesorectum, the distal 2 stitches and the middle 2 stitches.  I left the more proximal stitches out.  I proceeded to do rectal dilation and bring the EEA stapler up the rectal stump.  I brought the spike out the rectal stump.  We attached the anvil to the spike of the stapler and tightened that down.  I held the stapler clamped for 60 seconds.  I fired and held it clamped for 30 seconds and released.  I had 2 excellent anastomotic rings.  We did copious irrigation.  I did a rigid proctoscopy.  I insufflated the rectum with the descending colon clamped proximal to it with the anastomosis under fluid.  It dilated well with no leaking of air.  That was consistent with a negative air leak test.  I measured the anastomosis at 12 cm from the anal verge.  Hemostasis was good.  I deflated the colon.  I scrubbed back up into the abdomen and did copious irrigation to ensure hemostasis.  There had been some old blood, but there was no active bleeding in the pelvis.  I went ahead and took the proximal 2 stitches going to the proximal end of the mesh at about the level of the sacral promontory and placed a stitch in the lateral aspect of the colorectal anastomosis  and then tied that down.  With that, I had a good reinforcement of the anastomosis and a good pexy as well.  The anastomosis had some mild tension to it and looked a little bit snug, but no severe tension at all and looked healthy and viable.  I did copious irrigation with several liters of saline.  Hemostasis was good. I evacuated and kept no peritoneum over the ports.  I closed the Pfannenstiel incision using a 0 Vicryl stitch vertically in the posterior rectus preperitoneal fascia.  I closed the anterior rectus fascia transversely using #1 running PDS.  I closed the skin with some 4- 0 Monocryl stitch leaving a few small gaps for some Betadine-soaked wicks.  The smaller 5 mm port sites I closed using Monocryl stitches. Sterile dressing was applied.  Near the end of the case, the patient had a few clots come out the rectum but they were old blood and not bright red.  It did not persist. I decided to leave that be for now.  The patient is going to the recovery room.  I am about to discuss the operative findings with the patient's family.     Ardeth Sportsman, MD     SCG/MEDQ  D:  12/05/2011  T:  12/05/2011  Job:  [id number]  cc:   [name] A. Nicholos Johns, M.D. Fax: [phone]  Bernette Redbird, M.D. Fax: [phone]

## 2011-12-05 NOTE — Anesthesia Procedure Notes (Signed)
Procedure Name: Intubation Date/Time: 12/05/2011 7:47 AM Performed by: Randon Goldsmith CATHERINE PAYNE Pre-anesthesia Checklist: Patient identified, Emergency Drugs available, Suction available and Patient being monitored Patient Re-evaluated:Patient Re-evaluated prior to inductionOxygen Delivery Method: Circle System Utilized Preoxygenation: Pre-oxygenation with 100% oxygen Intubation Type: IV induction Ventilation: Mask ventilation without difficulty and Oral airway inserted - appropriate to patient size Laryngoscope Size: Mac and 3 Grade View: Grade III Tube type: Oral Tube size: 7.5 mm Number of attempts: 2 Airway Equipment and Method: stylet Placement Confirmation: ETT inserted through vocal cords under direct vision,  positive ETCO2,  CO2 detector and breath sounds checked- equal and bilateral Secured at: 21 cm Tube secured with: Tape Dental Injury: Teeth and Oropharynx as per pre-operative assessment

## 2011-12-05 NOTE — H&P (Signed)
Stephanie Barber  June 28, 1941 [id number]  Patient Care Team: Lolita Patella, MD as PCP - General (Family Medicine) Florencia Reasons, MD as Consulting Physician (Gastroenterology) Barbaraann Share, MD as Consulting Physician (Pulmonary Disease)  Reason for visit: Surgery for rectal prolapse.   Patient notes she's had 3 year history of worsening fecal incontinence. She notes in the morning she is continent to flatus and stool. However, by the end of the day, she notes that 2-3 inches of rectum arel pushed out and she will have problems with continence. It used to be worse when she had a lot more diarrhea. She is adjusted her diet and to be off dairy and some fruits/and vegetables. That has helped a lot.   She is followed by Dr. Matthias Hughs with [facility]. He had sent her for biofeedback. She felt helped a little. However she kept having worsening prolapse problems. He did endoscopy and noted a few small polyps that he thinks are benign, but no other problems aside from the prolapse. Pathology is pending. Based on the prolapse, he sent the patient to [place] for evaluation.   I saw her in November.  I recommended she consider surgery. She agrees.  No changes since last visit.  Trying Kegel pelvic floor exercises.  Patient Active Problem List  Diagnoses  . Chronic cough  . 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 Late-onset lactose intolerance    Past Medical History  Diagnosis Date  . Hypertension   . Hyperlipidemia   . Reflux   . Arthritis   . Diabetes mellitus   . Chronic headache   . Heart murmur   . GERD (gastroesophageal reflux disease)   . Rectal prolapse     Past Surgical History  Procedure Date  . Total abdominal hysterectomy   . Rotator cuff repair     Left  . Kidney surgery 1998    Right.  growth removed  . Back surgery     History   Social History  . Marital Status: Married    Spouse Name: N/A    Number of Children: 4  . Years  of Education: N/A   Occupational History  . 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    Social History Main Topics  . Smoking status: Former Games developer  . Smokeless tobacco: Not on file   Comment: quit in 1982  . Alcohol Use: No  . Drug Use: No  . Sexually Active: Not on file   Other Topics Concern  . Not on file   Social History Narrative  . No narrative on file    Family History  Problem Relation Age of Onset  . Heart disease Mother   . Throat cancer Father   . Cancer Father     stomach  . Kidney cancer Brother   . Heart disease Brother     Current facility-administered medications:alvimopan (ENTEREG) capsule 12 mg, 12 mg, Oral, Once, Ardeth Sportsman, MD, 12 mg at 12/05/11 0608;  ertapenem [facility]) 1 g in sodium chloride 0.9 % 50 mL IVPB, 1 g, Intravenous, 60 min Pre-Op, Ardeth Sportsman, MD;  heparin injection 5,000 Units, 5,000 Units, Subcutaneous, Once, Ardeth Sportsman, MD, 5,000 Units at 12/05/11 1191 metoprolol succinate (TOPROL-XL) 24 hr tablet 50 mg, 50 mg, Oral, Daily, Ardeth Sportsman, MD, 50 mg at 12/05/11 4782  Allergies  Allergen Reactions  . Novocain (Procaine Hcl) Anaphylaxis  . Hydrocodone Diarrhea and Nausea And Vomiting  . Morphine  And Related     BP 132/72  Pulse 77  Temp(Src) 98.6 F (37 C) (Oral)  Resp 18  SpO2 100%  '   Constitutional: Negative for fever, chills, diaphoresis, appetite change and fatigue.  HENT: Negative for ear pain, sore throat, trouble swallowing, neck pain and ear discharge.  Eyes: Negative for photophobia, discharge and visual disturbance.  Respiratory: Negative for cough, choking, chest tightness and shortness of breath.  Cardiovascular: Negative for chest pain and palpitations.  Gastrointestinal: Positive for diarrhea. Negative for nausea, vomiting, abdominal pain, constipation, blood in stool, abdominal distention, anal bleeding and rectal pain.  No personal nor family history of GI/colon cancer, inflammatory bowel  disease, irritable bowel syndrome, allergy such as Celiac Sprue, colitis, ulcers nor gastritis.  No recent sick contacts/gastroenteritis. No travel outside the country. No changes in diet.  Genitourinary: Negative for dysuria, urgency, frequency, decreased urine volume, vaginal discharge, difficulty urinating, vaginal pain and pelvic pain.  Urinary incontinence very rare  Musculoskeletal: Negative for myalgias and gait problem.  Skin: Negative for color change, pallor and rash.  Neurological: Negative for dizziness, speech difficulty, weakness and numbness.  Hematological: Negative for adenopathy. Does not bruise/bleed easily.  Psychiatric/Behavioral: Negative for confusion and agitation. The patient is not nervous/anxious.  Objective:   Physical Exam  Constitutional: She is oriented to person, place, and time. She appears well-developed and well-nourished. No distress.  HENT:  Head: Normocephalic.  Mouth/Throat: Oropharynx is clear and moist. No oropharyngeal exudate.  Eyes: Conjunctivae and EOM are normal. Pupils are equal, round, and reactive to light. No scleral icterus.  Neck: Normal range of motion. Neck supple. No tracheal deviation present.  Cardiovascular: Normal rate, regular rhythm and intact distal pulses.  Pulmonary/Chest: No respiratory distress. She exhibits no tenderness.  Abdominal: Soft. She exhibits no distension and no mass. There is no tenderness. There is no rebound and no guarding. Hernia confirmed negative in the right inguinal area and confirmed negative in the left inguinal area.  Overweight, low midline incision  Musculoskeletal: Normal range of motion. She exhibits no tenderness.  Lymphadenopathy:  She has no cervical adenopathy.  Right: No inguinal adenopathy present.  Left: No inguinal adenopathy present.  Neurological: She is alert and oriented to person, place, and time. No cranial nerve deficit. She exhibits normal muscle tone. Coordination normal.  Skin:  Skin is warm and dry. No rash noted. She is not diaphoretic. No erythema.  Psychiatric: She has a normal mood and affect. Her behavior is normal. Judgment and thought content normal.  Assessment:   Worsening rectal prolapse causing incontinence  Plan:   I think she would benefit from surgery. She may require resection as well, however after make that decision intraoperatively. She continues with her Kegel pelvic floor exercises which I agree with.   The anatomy & physiology of the digestive tract was discussed. The pathophysiology of rectal prolapse was discussed. Natural history risks without surgery was discussed. I feel the risks of no intervention will lead to serious problems that outweigh the operative risks; therefore, I recommended surgery to treat the pathology. Possible need for sigmoid colectomy to remove redundant colon was discussed. Pexy by suture and probable mesh reinforcement was discussed as well. Laparoscopic & open techniques were discussed.   Risks such as bleeding, infection, abscess, leak, reoperation, possible ostomy, hernia, heart attack, death, and other risks were discussed. I noted a good likelihood this will help address the problem. Goals of post-operative recovery were discussed as well. We will work to minimize  complications. An educational handout on the technique was given as well. Questions were answered. The patient expresses understanding & wishes to proceed with surgery.   I have re-reviewed the the patient's records, history, medications, and allergies.  I have re-examined the patient.  I again discussed intraoperative plans and goals of post-operative recovery.  The patient agrees to proceed.

## 2011-12-06 LAB — BASIC METABOLIC PANEL
CO2: 24 mEq/L (ref 19–32)
Calcium: 8.3 mg/dL — ABNORMAL LOW (ref 8.4–10.5)
Creatinine, Ser: 0.71 mg/dL (ref 0.50–1.10)
GFR calc Af Amer: >90 mL/min (ref >90)
GFR calc non Af Amer: 85 mL/min — ABNORMAL LOW (ref >90)

## 2011-12-06 LAB — CBC
MCV: 76.3 fL — ABNORMAL LOW (ref 78.0–100.0)
Platelets: 238 10*3/uL (ref 150–400)
RDW: 21.1 % — ABNORMAL HIGH (ref 11.5–15.5)
WBC: 11 10*3/uL — ABNORMAL HIGH (ref 4.0–10.5)

## 2011-12-06 LAB — GLUCOSE, CAPILLARY: Glucose-Capillary: 93 mg/dL (ref 70–99)

## 2011-12-06 LAB — PREPARE RBC (CROSSMATCH)

## 2011-12-06 LAB — HEMOGLOBIN AND HEMATOCRIT, BLOOD
HCT: 28.1 % — ABNORMAL LOW (ref 36.0–46.0)
Hemoglobin: 9.5 g/dL — ABNORMAL LOW (ref 12.0–15.0)

## 2011-12-06 NOTE — Progress Notes (Signed)
Patients HGB dropped to 7.4 this AM. She has saturated another peri pad with bright red blood. Vital signs still remain stable, patient is still alert and oriented. Called MD on call to notify about the HGB level along with the saturated peri-pads through the night. Was instructed by MD on call to wait until MD rounds on patient this AM. Also instructed to hold heparin dose this morning.  Will continue to monitor.  Stephanie Barber [facility] 5:39 AM 12/06/2011

## 2011-12-06 NOTE — Progress Notes (Signed)
Pt has had several episodes of bleeding from her rectum since 1900. She was up in the chair around 1930 and had a moderate amount of bright red blood saturating her peri pads. Pads were changed at that time. Patient got up to walk around 2200 and a moderate amount of bright red blood was saturating her peri pads at that time as well. Peri pads were changed after walk. Around 0200, the nurse tech reported that her peri pads were "saturated with bright red blood and dark blood clots" while lying in bed. Tech changed peri pads at that time. Patient's vital signs have remained stable, and patient has been alert and oriented. Will continue to monitor.  Samara Snide [facility] 4:43 AM 12/06/2011

## 2011-12-06 NOTE — Progress Notes (Signed)
Stephanie Barber 08-25-1941 [id number]  PCP: Lolita Patella, MD, MD Outpatient Care Team: Patient Care Team: Lolita Patella, MD as PCP - General (Family Medicine) Florencia Reasons, MD as Consulting Physician (Gastroenterology) Barbaraann Share, MD as Consulting Physician (Pulmonary Disease)  Inpatient Treatment Team: Treatment Team: Attending Provider: Ardeth Sportsman, MD; Registered Nurse: Pervis Hocking, RN  Subjective:  Pt with intermitttent rectal bleeding Walked x 2 No N/V.  Tol liquids Husband in room Pain controlled  Objective:  Vital signs:  Temp:  [95.6 F (35.3 C)-99.5 F (37.5 C)] 99.5 F (37.5 C) (01/23 0521) Pulse Rate:  [86-121] 98  (01/23 0618) Resp:  [10-20] 18  (01/23 0521) BP: (104-162)/(55-78) 108/58 mmHg (01/23 0618) SpO2:  [90 %-100 %] 93 % (01/23 0521) FiO2 (%):  [2 %] 2 % (01/22 1539) Weight:  [130 lb (58.968 kg)-136 lb 7.4 oz (61.9 kg)] 136 lb 7.4 oz (61.9 kg) (01/23 0521)    Intake/Output   Yesterday:  01/22 0701 - 01/23 0700 In: 4947 [P.O.:480; I.V.:3967; IV Piggyback:500] Out: 2180 [Urine:2080; Blood:100] This shift:  Total I/O In: 1187 [P.O.:120; I.V.:1067] Out: 1700 [Urine:1700]  Bowel function:  Flatus: Y   BM: Yes, scant blood  Physical Exam:  General: Pt awake/alert/oriented x4 in no acute distress.  Calm, smiling, pleassant Eyes: PERRL, normal EOM.  Sclera clear.  No icterus Neuro: CN II-XII intact w/o focal sensory/motor deficits. Lymph: No head/neck/groin lymphadenopathy Psych:  No delerium/psychosis/paranoia HENT: Normocephalic, Mucus membranes moist.  No thrush Neck: Supple, No tracheal deviation Chest: Clear. No chest wall pain w good excursion CV:  Pulses intact.  Regular rhythm Abdomen: Soft, Nontender/Nondistended.  No incarcerated hernias.  Dressings c/d/i Ext:  SCDs BLE.  No mjr edema.  No cyanosis Skin: No petechiae / purpurae  Results:   Labs: Results for orders placed during the  hospital encounter of 12/05/11 (from the past 48 hour(s))  GLUCOSE, CAPILLARY     Status: Abnormal   Collection Time   12/05/11  6:04 AM      Component Value Range Comment   Glucose-Capillary 113 (*) 70 - 99 (mg/dL)   GLUCOSE, CAPILLARY     Status: Abnormal   Collection Time   12/05/11 10:15 AM      Component Value Range Comment   Glucose-Capillary 186 (*) 70 - 99 (mg/dL)   GLUCOSE, CAPILLARY     Status: Abnormal   Collection Time   12/05/11 12:33 PM      Component Value Range Comment   Glucose-Capillary 184 (*) 70 - 99 (mg/dL)    Comment 1 Documented in Chart      Comment 2 Notify RN     CBC     Status: Abnormal   Collection Time   12/05/11  3:36 PM      Component Value Range Comment   WBC 14.3 (*) 4.0 - 10.5 (K/uL)    RBC 4.13  3.87 - 5.11 (MIL/uL)    Hemoglobin 10.4 (*) 12.0 - 15.0 (g/dL)    HCT 40.9 (*) 81.1 - 46.0 (%)    MCV 78.7  78.0 - 100.0 (fL)    MCH 25.2 (*) 26.0 - 34.0 (pg)    MCHC 32.0  30.0 - 36.0 (g/dL)    RDW 91.4 (*) 78.2 - 15.5 (%)    Platelets 313  150 - 400 (K/uL)   CREATININE, SERUM     Status: Abnormal   Collection Time   12/05/11  3:36 PM      Component  Value Range Comment   Creatinine, Ser 0.75  0.50 - 1.10 (mg/dL)    GFR calc non Af Amer 83 (*) >90 (mL/min)    GFR calc Af Amer >90  >90 (mL/min)   GLUCOSE, CAPILLARY     Status: Abnormal   Collection Time   12/05/11  4:20 PM      Component Value Range Comment   Glucose-Capillary 226 (*) 70 - 99 (mg/dL)    Comment 1 Notify RN      Comment 2 Documented in Chart     GLUCOSE, CAPILLARY     Status: Abnormal   Collection Time   12/05/11  9:28 PM      Component Value Range Comment   Glucose-Capillary 192 (*) 70 - 99 (mg/dL)    Comment 1 Notify RN     BASIC METABOLIC PANEL     Status: Abnormal   Collection Time   12/06/11  4:26 AM      Component Value Range Comment   Sodium 135  135 - 145 (mEq/L)    Potassium 4.0  3.5 - 5.1 (mEq/L)    Chloride 104  96 - 112 (mEq/L)    CO2 24  19 - 32 (mEq/L)     Glucose, Bld 134 (*) 70 - 99 (mg/dL)    BUN 10  6 - 23 (mg/dL)    Creatinine, Ser 1.47  0.50 - 1.10 (mg/dL)    Calcium 8.3 (*) 8.4 - 10.5 (mg/dL)    GFR calc non Af Amer 85 (*) >90 (mL/min)    GFR calc Af Amer >90  >90 (mL/min)   CBC     Status: Abnormal   Collection Time   12/06/11  4:26 AM      Component Value Range Comment   WBC 11.0 (*) 4.0 - 10.5 (K/uL)    RBC 2.91 (*) 3.87 - 5.11 (MIL/uL)    Hemoglobin 7.4 (*) 12.0 - 15.0 (g/dL)    HCT 82.9 (*) 56.2 - 46.0 (%)    MCV 76.3 (*) 78.0 - 100.0 (fL)    MCH 25.4 (*) 26.0 - 34.0 (pg)    MCHC 33.3  30.0 - 36.0 (g/dL)    RDW 13.0 (*) 86.5 - 15.5 (%)    Platelets 238  150 - 400 (K/uL)     Imaging / Studies: No results found.  Medications / Allergies: per chart  Antibiotics: Anti-infectives     Start     Dose/Rate Route Frequency Ordered Stop   12/05/11 0545   ertapenem (INVANZ) 1 g in sodium chloride 0.9 % 50 mL IVPB  Status:  Discontinued        1 g 100 mL/hr over 30 Minutes Intravenous 60 min pre-op 12/05/11 0535 12/05/11 1423          Assessment  Stephanie Barber  71 y.o. 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  Pt HD stable -HTN - controlled, low - hold w bleeding & follow -glc control for DM -VTE prophylaxis- SCDs, etc -mobilize as tolerated to help recovery  Ardeth Sportsman, M.D., F.A.C.S. Gastrointestinal and Minimally Invasive Surgery [facility] [address] [place], [place] [address] [phone] Main / Paging [phone] Voice  Mail   12/06/2011

## 2011-12-06 NOTE — Progress Notes (Signed)
Pt called me into her room at 0650 and stated she was "gushing blood". She had a copious amount of bright red blood mixed with clots that was coming up in between her legs as she was laying down. Called MD to make aware and was instructed to go ahead with the order to transfuse 2 units of PRBC's and hold all blood thinners and blood pressure medications. Pt's vital signs remained stable (HR 102, BP 108/64, T 99.6, R 20, O2 93% on room air). Will report to oncoming RN to continue to monitor.  [name] 7:40 AM 12/06/2011

## 2011-12-06 NOTE — Progress Notes (Signed)
Patient ambulated to restroom with assistance. Moderate amount of red blood was passed with a mixture of stool. Approximately 200ccs in stool. Pad had a moderate amount of stool on it as well.

## 2011-12-07 LAB — GLUCOSE, CAPILLARY
Glucose-Capillary: 136 mg/dL — ABNORMAL HIGH (ref 70–99)
Glucose-Capillary: 154 mg/dL — ABNORMAL HIGH (ref 70–99)

## 2011-12-07 LAB — CBC
Platelets: 164 10*3/uL (ref 150–400)
RDW: 19.7 % — ABNORMAL HIGH (ref 11.5–15.5)
WBC: 7.9 10*3/uL (ref 4.0–10.5)

## 2011-12-07 MED ORDER — VITAMINS A & D EX OINT
TOPICAL_OINTMENT | CUTANEOUS | Status: AC
  Administered 2011-12-07: 5
  Filled 2011-12-07: qty 5

## 2011-12-07 MED ORDER — SODIUM CHLORIDE 0.9 % IJ SOLN
3.0000 mL | Freq: Two times a day (BID) | INTRAMUSCULAR | Status: DC
  Administered 2011-12-07 (×2): 3 mL via INTRAVENOUS

## 2011-12-07 MED ORDER — SODIUM CHLORIDE 0.9 % IJ SOLN: 3.0000 mL | INTRAMUSCULAR | Status: DC | PRN

## 2011-12-07 NOTE — Progress Notes (Signed)
Pt is alert and oriented, discharge instructions reviewed with patient and questions and concerns answered, insulin pens were given prescriptsions, iv taken out per order, pt received pneumo vaccine yesterday Means, Tillmon Kisling N 12-06-10 17:03pm

## 2011-12-07 NOTE — Progress Notes (Signed)
Stephanie Barber 04-Nov-1941 [id number]  PCP: Lolita Patella, MD, MD Outpatient Care Team: Patient Care Team: Lolita Patella, MD as PCP - General (Family Medicine) Florencia Reasons, MD as Consulting Physician (Gastroenterology) Barbaraann Share, MD as Consulting Physician (Pulmonary Disease)  Inpatient Treatment Team: Treatment Team: Attending Provider: Ardeth Sportsman, MD; Registered Nurse: Rayburn Ma, RN; Nursing Student: Fanny Dance, Student; Nursing Student: Marcia Brash, RN  Subjective:  Pt with intermitttent rectal bleeding.  Most yest AM but tapering off Pt HD stable w/o active bleeding yest afternoon when I called & d/w RN.   Transfused 2 U PRBCs Walking to BR only No N/V.  Tol liquids Husband in room - he notes pt feeling better w/o mjr rectal bleeding Pain controlled  Objective:  Vital signs:  Temp:  [97.7 F (36.5 C)-100.1 F (37.8 C)] 98.8 F (37.1 C) (01/24 0535) Pulse Rate:  [90-117] 90  (01/24 0700) Resp:  [16-20] 20  (01/24 0700) BP: (107-136)/(63-81) 128/73 mmHg (01/24 0700) SpO2:  [94 %-96 %] 95 % (01/24 0535) Last BM Date: 12/04/11  Intake/Output   Yesterday:  01/23 0701 - 01/24 0700 In: 1918.5 [P.O.:960; I.V.:500; Blood:458.5] Out: 5300 [Urine:5300] This shift:     Bowel function:  Flatus: Y   BM: Yes, some blood but tapering down  Physical Exam:  General: Pt awake/alert/oriented x4 in no acute distress.  Calm, smiling, pleassant Eyes: PERRL, normal EOM.  Sclera clear.  No icterus Neuro: CN II-XII intact w/o focal sensory/motor deficits. Lymph: No head/neck/groin lymphadenopathy Psych:  No delerium/psychosis/paranoia HENT: Normocephalic, Mucus membranes moist.  No thrush Neck: Supple, No tracheal deviation Chest: Clear. No chest wall pain w good excursion CV:  Pulses intact.  Regular rhythm Abdomen: Soft, Nontender/Nondistended.  No incarcerated hernias.  Dressings c/d/i Ext:  SCDs BLE.  No mjr edema.   No cyanosis Skin: No petechiae / purpurae  Results:   Labs: Results for orders placed during the hospital encounter of 12/05/11 (from the past 48 hour(s))  GLUCOSE, CAPILLARY     Status: Abnormal   Collection Time   12/05/11 10:15 AM      Component Value Range Comment   Glucose-Capillary 186 (*) 70 - 99 (mg/dL)   GLUCOSE, CAPILLARY     Status: Abnormal   Collection Time   12/05/11 12:33 PM      Component Value Range Comment   Glucose-Capillary 184 (*) 70 - 99 (mg/dL)    Comment 1 Documented in Chart      Comment 2 Notify RN     CBC     Status: Abnormal   Collection Time   12/05/11  3:36 PM      Component Value Range Comment   WBC 14.3 (*) 4.0 - 10.5 (K/uL)    RBC 4.13  3.87 - 5.11 (MIL/uL)    Hemoglobin 10.4 (*) 12.0 - 15.0 (g/dL)    HCT 86.5 (*) 78.4 - 46.0 (%)    MCV 78.7  78.0 - 100.0 (fL)    MCH 25.2 (*) 26.0 - 34.0 (pg)    MCHC 32.0  30.0 - 36.0 (g/dL)    RDW 69.6 (*) 29.5 - 15.5 (%)    Platelets 313  150 - 400 (K/uL)   CREATININE, SERUM     Status: Abnormal   Collection Time   12/05/11  3:36 PM      Component Value Range Comment   Creatinine, Ser 0.75  0.50 - 1.10 (mg/dL)    GFR calc non Af Denyse Dago  83 (*) >90 (mL/min)    GFR calc Af Amer >90  >90 (mL/min)   GLUCOSE, CAPILLARY     Status: Abnormal   Collection Time   12/05/11  4:20 PM      Component Value Range Comment   Glucose-Capillary 226 (*) 70 - 99 (mg/dL)    Comment 1 Notify RN      Comment 2 Documented in Chart     GLUCOSE, CAPILLARY     Status: Abnormal   Collection Time   12/05/11  9:28 PM      Component Value Range Comment   Glucose-Capillary 192 (*) 70 - 99 (mg/dL)    Comment 1 Notify RN     BASIC METABOLIC PANEL     Status: Abnormal   Collection Time   12/06/11  4:26 AM      Component Value Range Comment   Sodium 135  135 - 145 (mEq/L)    Potassium 4.0  3.5 - 5.1 (mEq/L)    Chloride 104  96 - 112 (mEq/L)    CO2 24  19 - 32 (mEq/L)    Glucose, Bld 134 (*) 70 - 99 (mg/dL)    BUN 10  6 - 23 (mg/dL)      Creatinine, Ser 9.60  0.50 - 1.10 (mg/dL)    Calcium 8.3 (*) 8.4 - 10.5 (mg/dL)    GFR calc non Af Amer 85 (*) >90 (mL/min)    GFR calc Af Amer >90  >90 (mL/min)   CBC     Status: Abnormal   Collection Time   12/06/11  4:26 AM      Component Value Range Comment   WBC 11.0 (*) 4.0 - 10.5 (K/uL)    RBC 2.91 (*) 3.87 - 5.11 (MIL/uL)    Hemoglobin 7.4 (*) 12.0 - 15.0 (g/dL)    HCT 45.4 (*) 09.8 - 46.0 (%)    MCV 76.3 (*) 78.0 - 100.0 (fL)    MCH 25.4 (*) 26.0 - 34.0 (pg)    MCHC 33.3  30.0 - 36.0 (g/dL)    RDW 11.9 (*) 14.7 - 15.5 (%)    Platelets 238  150 - 400 (K/uL)   PREPARE RBC (CROSSMATCH)     Status: Normal   Collection Time   12/06/11  7:00 AM      Component Value Range Comment   Order Confirmation ORDER PROCESSED BY BLOOD BANK     GLUCOSE, CAPILLARY     Status: Abnormal   Collection Time   12/06/11  7:38 AM      Component Value Range Comment   Glucose-Capillary 133 (*) 70 - 99 (mg/dL)   GLUCOSE, CAPILLARY     Status: Normal   Collection Time   12/06/11 11:39 AM      Component Value Range Comment   Glucose-Capillary 97  70 - 99 (mg/dL)    Comment 1 Notify RN      Comment 2 Documented in Chart     GLUCOSE, CAPILLARY     Status: Normal   Collection Time   12/06/11  4:40 PM      Component Value Range Comment   Glucose-Capillary 88  70 - 99 (mg/dL)   HEMOGLOBIN AND HEMATOCRIT, BLOOD     Status: Abnormal   Collection Time   12/06/11  8:00 PM      Component Value Range Comment   Hemoglobin 9.5 (*) 12.0 - 15.0 (g/dL) DELTA CHECK NOTED   HCT 28.1 (*) 36.0 - 46.0 (%)   GLUCOSE,  CAPILLARY     Status: Normal   Collection Time   12/06/11  9:21 PM      Component Value Range Comment   Glucose-Capillary 93  70 - 99 (mg/dL)   CBC     Status: Abnormal   Collection Time   12/07/11  4:15 AM      Component Value Range Comment   WBC 7.9  4.0 - 10.5 (K/uL)    RBC 3.42 (*) 3.87 - 5.11 (MIL/uL)    Hemoglobin 9.5 (*) 12.0 - 15.0 (g/dL)    HCT 65.7 (*) 84.6 - 46.0 (%)    MCV 79.8  78.0  - 100.0 (fL)    MCH 27.8  26.0 - 34.0 (pg)    MCHC 34.8  30.0 - 36.0 (g/dL)    RDW 96.2 (*) 95.2 - 15.5 (%)    Platelets 164  150 - 400 (K/uL)     Imaging / Studies: No results found.  Medications / Allergies: per chart  Antibiotics: Anti-infectives     Start     Dose/Rate Route Frequency Ordered Stop   12/05/11 0545   ertapenem (INVANZ) 1 g in sodium chloride 0.9 % 50 mL IVPB  Status:  Discontinued        1 g 100 mL/hr over 30 Minutes Intravenous 60 min pre-op 12/05/11 0535 12/05/11 1423          Assessment  Stephanie Barber  71 y.o. 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  Pt HD stable -HTN - controlled, low - hold w bleeding & follow -glc control for DM -VTE prophylaxis- SCDs, etc -mobilize as tolerated to help recovery  D/w pt & husband  Ardeth Sportsman, M.D., F.A.C.S. Gastrointestinal and Minimally Invasive Surgery [facility] [address], [address] [place], [place] [address] [phone] Main / Paging [phone] Voice Mail   12/07/2011

## 2011-12-08 LAB — HEMOGLOBIN: Hemoglobin: 10.2 g/dL — ABNORMAL LOW (ref 12.0–15.0)

## 2011-12-08 MED ORDER — OXYCODONE HCL 5 MG PO TABS: 5.0000 mg | ORAL_TABLET | ORAL | Status: DC | PRN

## 2011-12-08 MED ORDER — OXYCODONE HCL 5 MG PO TABS: 5.0000 mg | ORAL_TABLET | ORAL | Status: AC | PRN

## 2011-12-08 NOTE — Progress Notes (Signed)
Assessment unchanged. Pt verbalized understanding of dc instructions. Script x1 given as provided by MD. Discharged via wc to front entrance to meet awaiting vehicle to carry home. Accompanied by husband and nursing student.

## 2011-12-08 NOTE — Progress Notes (Signed)
Stephanie Barber [birth year] [id number]  PCP: Lolita Patella, MD, MD Outpatient Care Team: Patient Care Team: Lolita Patella, MD as PCP - General (Family Medicine) Florencia Reasons, MD as Consulting Physician (Gastroenterology) Barbaraann Share, MD as Consulting Physician (Pulmonary Disease)  Inpatient Treatment Team: Treatment Team: Attending Provider: Ardeth Sportsman, MD; Nursing Student: Fanny Dance, Student; Nursing Student: Marcia Brash, RN; Registered Nurse: Pervis Hocking, RN  Subjective:  Walking well No N/V.  Tol PO well Pain controlled  Objective:  Vital signs:  Temp:  [97.7 F (36.5 C)-98.4 F (36.9 C)] 98.2 F (36.8 C) (01/25 4098) Pulse Rate:  [90-112] 101  (01/25 0608) Resp:  [17-20] 18  (01/25 0608) BP: (120-146)/(70-76) 122/74 mmHg (01/25 0608) SpO2:  [95 %-98 %] 97 % (01/25 0608) Last BM Date: 12/07/11  Intake/Output   Yesterday:  01/24 0701 - 01/25 0700 In: 1572.6 [P.O.:1180; I.V.:392.6] Out: 1760 [Urine:1760] This shift:  Total I/O In: -  Out: 1150 [Urine:1150]  Bowel function:  Flatus: Y   BM: Yes, minimal blood   Physical Exam:  General: Pt awake/alert/oriented x4 in no acute distress.  Calm, smiling, pleassant Eyes: PERRL, normal EOM.  Sclera clear.  No icterus Neuro: CN II-XII intact w/o focal sensory/motor deficits. Lymph: No head/neck/groin lymphadenopathy Psych:  No delerium/psychosis/paranoia HENT: Normocephalic, Mucus membranes moist.  No thrush Neck: Supple, No tracheal deviation Chest: Clear. No chest wall pain w good excursion CV:  Pulses intact.  Regular rhythm Abdomen: Soft, Nontender/Nondistended.  No incarcerated hernias.  Incisions c/d/i Ext:  SCDs BLE.  No mjr edema.  No cyanosis Skin: No petechiae / purpurae  Results:   Labs: Results for orders placed during the hospital encounter of 12/05/11 (from the past 48 hour(s))  PREPARE RBC (CROSSMATCH)     Status: Normal   Collection Time   12/06/11  7:00 AM      Component Value Range Comment   Order Confirmation ORDER PROCESSED BY BLOOD BANK     GLUCOSE, CAPILLARY     Status: Abnormal   Collection Time   12/06/11  7:38 AM      Component Value Range Comment   Glucose-Capillary 133 (*) 70 - 99 (mg/dL)   GLUCOSE, CAPILLARY     Status: Normal   Collection Time   12/06/11 11:39 AM      Component Value Range Comment   Glucose-Capillary 97  70 - 99 (mg/dL)    Comment 1 Notify RN      Comment 2 Documented in Chart     GLUCOSE, CAPILLARY     Status: Normal   Collection Time   12/06/11  4:40 PM      Component Value Range Comment   Glucose-Capillary 88  70 - 99 (mg/dL)   HEMOGLOBIN AND HEMATOCRIT, BLOOD     Status: Abnormal   Collection Time   12/06/11  8:00 PM      Component Value Range Comment   Hemoglobin 9.5 (*) 12.0 - 15.0 (g/dL) DELTA CHECK NOTED   HCT 28.1 (*) 36.0 - 46.0 (%)   GLUCOSE, CAPILLARY     Status: Normal   Collection Time   12/06/11  9:21 PM      Component Value Range Comment   Glucose-Capillary 93  70 - 99 (mg/dL)   CBC     Status: Abnormal   Collection Time   12/07/11  4:15 AM      Component Value Range Comment   WBC 7.9  4.0 - 10.5 (K/uL)  RBC 3.42 (*) 3.87 - 5.11 (MIL/uL)    Hemoglobin 9.5 (*) 12.0 - 15.0 (g/dL)    HCT 91.4 (*) 78.2 - 46.0 (%)    MCV 79.8  78.0 - 100.0 (fL)    MCH 27.8  26.0 - 34.0 (pg)    MCHC 34.8  30.0 - 36.0 (g/dL)    RDW 95.6 (*) 21.3 - 15.5 (%)    Platelets 164  150 - 400 (K/uL)   GLUCOSE, CAPILLARY     Status: Abnormal   Collection Time   12/07/11  8:07 AM      Component Value Range Comment   Glucose-Capillary 105 (*) 70 - 99 (mg/dL)    Comment 1 Notify RN      Comment 2 Documented in Chart     GLUCOSE, CAPILLARY     Status: Abnormal   Collection Time   12/07/11 12:08 PM      Component Value Range Comment   Glucose-Capillary 116 (*) 70 - 99 (mg/dL)    Comment 1 Notify RN      Comment 2 Documented in Chart     GLUCOSE, CAPILLARY     Status: Abnormal   Collection Time    12/07/11  4:11 PM      Component Value Range Comment   Glucose-Capillary 136 (*) 70 - 99 (mg/dL)    Comment 1 Notify RN      Comment 2 Documented in Chart     GLUCOSE, CAPILLARY     Status: Abnormal   Collection Time   12/07/11  9:59 PM      Component Value Range Comment   Glucose-Capillary 154 (*) 70 - 99 (mg/dL)   HEMOGLOBIN     Status: Abnormal   Collection Time   12/08/11  3:38 AM      Component Value Range Comment   Hemoglobin 10.2 (*) 12.0 - 15.0 (g/dL)     Imaging / Studies: No results found.  Medications / Allergies: per chart  Antibiotics: Anti-infectives     Start     Dose/Rate Route Frequency Ordered Stop   12/05/11 0545   ertapenem (INVANZ) 1 g in sodium chloride 0.9 % 50 mL IVPB  Status:  Discontinued        1 g 100 mL/hr over 30 Minutes Intravenous 60 min pre-op 12/05/11 0535 12/05/11 1423          Assessment  Stephanie Barber  71 y.o. 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 Gastrointestinal and Minimally Invasive Surgery [facility] [address] [place], [place] [address] [phone] Main / Paging [phone] Voice Mail   12/08/2011

## 2011-12-12 NOTE — Discharge Summary (Signed)
Physician Discharge Summary  Patient ID: Stephanie Barber MRN: [id number] DOB/AGE: 05/07/1941 71 y.o.  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  She did have moderate rectal bleeding on POD1, requiring 2 U PRBCs.  Her bleeding stopped & she stabilizing.  She progressed well.  By the time of D/C, she was walking well in the hallways, pain was controlled, she was not have significant rectal bleeding, she was eating well, and was having flatus/BMs.  Therefore, she could be D/Cd home  Consults: None  Significant Diagnostic Studies:   Treatments: surgery: Lap sigmoid resection & rectopexy  Discharge Exam: Blood pressure 164/85, pulse 119, temperature 97.9 F (36.6 C), temperature source Oral, resp. rate 20, height 4\' 11"  (1.499 m), weight 136 lb 7.4 oz (61.9 kg), SpO2 98.00%.  General: Pt awake/alert/oriented x4 in no major acute distress Eyes: PERRL, normal EOM. Neuro: CN II-XII intact w/o focal sensory/motor deficits. Lymph: No head/neck/groin lymphadenopathy Psych:  No delerium/psychosis/paranoia.  Smiling, calm HEENT: Normocephalic, Mucus membranes moist.  No thrush Neck: Supple, No tracheal deviation Chest: No pain w good excursion CV:  Pulses intact.  Regular rhythm Abdomen: soft, nontender/nondistended.  No incarcerated hernias.  Incisions c/d/i.  No rectal prolapse Ext:  SCDs BLE.  No mjr edema.  No cyanosis Skin: No petechiae /  purpurae   Disposition: Home or Self Care  Discharge Orders    Future Orders Please Complete By Expires   Diet - low sodium heart healthy      Increase activity slowly        Medication List  As of 12/12/2011  7:51 AM   TAKE these medications         acetaminophen 500 MG tablet   Commonly known as: TYLENOL   Take 500-1,000 mg by mouth every 6 (six) hours as needed. PAIN        amLODipine 10 MG tablet   Commonly known as: NORVASC   Take 10 mg by mouth daily after breakfast.      aspirin 81 MG tablet   Take 81 mg by mouth at bedtime.      BIOTIN PO   Take 50 mg by mouth 2 (two) times daily.      celecoxib 200 MG capsule   Commonly known as: CELEBREX   Take 200 mg by mouth daily.      Cinnamon 500 MG capsule   Take 500 mg by mouth daily.      fish oil-omega-3 fatty acids 1000 MG capsule   Take 1 g by mouth daily.      gabapentin 400 MG capsule   Commonly known as: NEURONTIN   Take 400-800 mg by mouth at bedtime.      losartan 100 MG tablet   Commonly known as: COZAAR   Take 100 mg by mouth daily after breakfast.      metFORMIN 500 MG tablet   Commonly known as: GLUCOPHAGE   Take 500 mg by mouth 2 (two) times daily with a meal.      metoprolol succinate 50 MG 24 hr tablet   Commonly known as: TOPROL-XL  Take 50 mg by mouth daily after breakfast.      oxyCODONE 5 MG immediate release tablet   Commonly known as: Oxy IR/ROXICODONE   Take 1-2 tablets (5-10 mg total) by mouth every 4 (four) hours as needed.      polysaccharide iron 150 MG Caps capsule   Commonly known as: NIFEREX   Take 150 mg by mouth daily.      pravastatin 40 MG tablet   Commonly known as: PRAVACHOL   Take 40 mg by mouth daily after breakfast.      ranitidine 300 MG capsule   Commonly known as: ZANTAC   Take 300 mg by mouth 2 (two) times daily.      SYSTANE ULTRA 0.4-0.3 % Soln   Generic drug: Polyethyl Glycol-Propyl Glycol   Place 1 drop into both eyes daily.              Signed: Dollie Mayse C. 12/12/2011, 7:51 AM

## 2011-12-20 ENCOUNTER — Encounter (INDEPENDENT_AMBULATORY_CARE_PROVIDER_SITE_OTHER): Payer: Self-pay | Admitting: Surgery

## 2011-12-20 ENCOUNTER — Ambulatory Visit (INDEPENDENT_AMBULATORY_CARE_PROVIDER_SITE_OTHER): Payer: Medicare Other | Admitting: Surgery

## 2011-12-20 DIAGNOSIS — K561 Intussusception: Secondary | ICD-10-CM

## 2011-12-20 DIAGNOSIS — K623 Rectal prolapse: Secondary | ICD-10-CM

## 2011-12-20 NOTE — Progress Notes (Signed)
Subjective:     Patient ID: Stephanie Barber, female   DOB: 1941-08-27, 71 y.o.   MRN: [id number]  HPI  Stephanie Barber  18-May-1941 [id number]  Patient Care Team: Lolita Patella, MD as PCP - General (Family Medicine) Florencia Reasons, MD as Consulting Physician (Gastroenterology) Barbaraann Share, MD as Consulting Physician (Pulmonary Disease)  This patient is a 71 y.o.female who presents today for surgical evaluation.   Procedure: Laparoscopic sigmoid colectomy and rectopexy for rectal prolapse 12/05/2011.  The patient comes in today with her husband. She was feeling rather good until last weekend. She was rather active for the weekend. She began to get more sore. Upper abdominal pains. No nausea or vomiting. Having bowel movements about 2 or 3 times a day. Good appetite. Her husband thinks that she overdid it. She seemed to be annoyed at his hypothesis. Not using oxycodone. Trying some Tylenol but does not think it helps. No problems with her incision. Energy level getting better.  Patient Active Problem List  Diagnoses  . Chronic cough  . 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 Late-onset lactose intolerance  . Diabetes mellitus  . Hypertension  . GERD (gastroesophageal reflux disease)    Past Medical History  Diagnosis Date  . Hypertension   . Hyperlipidemia   . Arthritis   . Diabetes mellitus   . Chronic headache   . Heart murmur   . GERD (gastroesophageal reflux disease)   . Rectal prolapse     Past Surgical History  Procedure Date  . Total abdominal hysterectomy   . Rotator cuff repair     Left  . Kidney surgery 1998    Right.  growth removed  . Back surgery   . Rectal prolapse repair 12/05/11    History   Social History  . Marital Status: Married    Spouse Name: N/A    Number of Children: 4  . Years of Education: N/A   Occupational History  . 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    Social History Main  Topics  . Smoking status: Former Games developer  . Smokeless tobacco: Never Used   Comment: quit in 1982  . Alcohol Use: No  . Drug Use: No  . Sexually Active: Not on file   Other Topics Concern  . Not on file   Social History Narrative  . No narrative on file    Family History  Problem Relation Age of Onset  . Heart disease Mother   . Throat cancer Father   . Cancer Father     stomach  . Kidney cancer Brother   . Heart disease Brother     Current outpatient prescriptions:acetaminophen (TYLENOL) 500 MG tablet, Take 500-1,000 mg by mouth every 6 (six) hours as needed. PAIN  , Disp: , Rfl: ;  amLODipine (NORVASC) 10 MG tablet, Take 10 mg by mouth daily after breakfast. , Disp: , Rfl: ;  aspirin 81 MG tablet, Take 81 mg by mouth at bedtime. , Disp: , Rfl: ;  BIOTIN PO, Take 50 mg by mouth 2 (two) times daily.  , Disp: , Rfl:  celecoxib (CELEBREX) 200 MG capsule, Take 200 mg by mouth daily. , Disp: , Rfl: ;  Cinnamon 500 MG capsule, Take 500 mg by mouth daily.  , Disp: , Rfl: ;  fish oil-omega-3 fatty acids 1000 MG capsule, Take 1 g by mouth daily.  , Disp: , Rfl: ;  gabapentin (NEURONTIN) 400 MG capsule,  Take 400-800 mg by mouth at bedtime. , Disp: , Rfl: ;  losartan (COZAAR) 100 MG tablet, Take 100 mg by mouth daily after breakfast. , Disp: , Rfl:  metFORMIN (GLUCOPHAGE) 500 MG tablet, Take 500 mg by mouth 2 (two) times daily with a meal. , Disp: , Rfl: ;  metoprolol (TOPROL-XL) 50 MG 24 hr tablet, Take 50 mg by mouth daily after breakfast. , Disp: , Rfl: ;  Polyethyl Glycol-Propyl Glycol (SYSTANE ULTRA) 0.4-0.3 % SOLN, Place 1 drop into both eyes daily.  , Disp: , Rfl: ;  polysaccharide iron (NIFEREX) 150 MG CAPS capsule, Take 150 mg by mouth daily. , Disp: , Rfl:  pravastatin (PRAVACHOL) 40 MG tablet, Take 40 mg by mouth daily after breakfast. , Disp: , Rfl: ;  ranitidine (ZANTAC) 300 MG capsule, Take 300 mg by mouth 2 (two) times daily. , Disp: , Rfl:   Allergies  Allergen Reactions  .  Novocain (Procaine Hcl) Anaphylaxis  . Hydrocodone Diarrhea and Nausea And Vomiting  . Morphine And Related     BP 118/68  Pulse 76  Temp(Src) 97.8 F (36.6 C) (Temporal)  Resp 18  Ht 5' (1.524 m)  Wt 134 lb 3.2 oz (60.873 kg)  BMI 26.21 kg/m2     Review of Systems  Constitutional: Negative for fever, chills and diaphoresis.  HENT: Negative for ear pain, sore throat and trouble swallowing.   Eyes: Negative for photophobia and visual disturbance.  Respiratory: Negative for cough and choking.   Cardiovascular: Negative for chest pain and palpitations.  Gastrointestinal: Positive for abdominal pain. Negative for nausea, vomiting, diarrhea, constipation, blood in stool, abdominal distention, anal bleeding and rectal pain.  Genitourinary: Negative for dysuria, frequency and difficulty urinating.  Musculoskeletal: Negative for myalgias, back pain, arthralgias and gait problem.       Mild thigh cramping x 2 - not now  Skin: Negative for color change, pallor and rash.  Neurological: Negative for dizziness, speech difficulty, weakness and numbness.  Hematological: Negative for adenopathy.  Psychiatric/Behavioral: Negative for confusion and agitation. The patient is not nervous/anxious.        Objective:   Physical Exam  Constitutional: She is oriented to person, place, and time. She appears well-developed and well-nourished. No distress.  HENT:  Head: Normocephalic.  Mouth/Throat: Oropharynx is clear and moist. No oropharyngeal exudate.  Eyes: Conjunctivae and EOM are normal. Pupils are equal, round, and reactive to light. No scleral icterus.  Neck: Normal range of motion. No tracheal deviation present.  Cardiovascular: Normal rate and intact distal pulses.   Pulmonary/Chest: Effort normal. No respiratory distress. She exhibits no tenderness.  Abdominal: Soft. She exhibits no distension and no mass. There is no rebound and no guarding. Hernia confirmed negative in the right  inguinal area and confirmed negative in the left inguinal area.       Incisions clean with normal healing ridges.  No hernias.  Mild upper BUQ soreness.  No peritonitis  Genitourinary: No vaginal discharge found.       Perianal skin clean with good hygiene.  No pruritis.  No pilonidal disease.  No fissure.  No abscess/fistula.    Tolerates digital rectal exam.  Normal sphincter tone.  No more prolapse  Musculoskeletal: Normal range of motion. She exhibits no edema.  Lymphadenopathy:       Right: No inguinal adenopathy present.       Left: No inguinal adenopathy present.  Neurological: She is alert and oriented to person, place, and time. No cranial  nerve deficit. She exhibits normal muscle tone. Coordination normal.  Skin: Skin is warm and dry. No rash noted. She is not diaphoretic.  Psychiatric: She has a normal mood and affect. Her behavior is normal.       Assessment:     2 weeks s/p lap colectomy/pexy for rectal prolapse, recovering mostly well    Plan:     Increase activity as tolerated.  Do not push through pain.  Use Aleve/Heat.  Retry oxycodone vs a milder narcotic.  Advanced on diet as tolerated. Bowel regimen to avoid problems.  She doesn't want to start fiber  Overall, she looks rather well. I think she did overdo it a little bit and is feeling more sore. If her pain worsens or does not improve, then set up labs and CAT scan to rule out leak/abscess.   Return to clinic 2 weeks. The patient expressed understanding and appreciation

## 2011-12-20 NOTE — Patient Instructions (Signed)

## 2012-01-08 ENCOUNTER — Encounter (INDEPENDENT_AMBULATORY_CARE_PROVIDER_SITE_OTHER): Payer: Medicare Other | Admitting: Surgery

## 2012-01-23 ENCOUNTER — Encounter (INDEPENDENT_AMBULATORY_CARE_PROVIDER_SITE_OTHER): Payer: Self-pay | Admitting: Surgery

## 2012-01-23 ENCOUNTER — Ambulatory Visit (INDEPENDENT_AMBULATORY_CARE_PROVIDER_SITE_OTHER): Payer: Medicare Other | Admitting: Surgery

## 2012-01-23 VITALS — BP 128/70 | HR 72 | Temp 97.1°F | Resp 16 | Ht 60.0 in | Wt 133.6 lb

## 2012-01-23 DIAGNOSIS — K561 Intussusception: Secondary | ICD-10-CM

## 2012-01-23 DIAGNOSIS — K6289 Other specified diseases of anus and rectum: Secondary | ICD-10-CM | POA: Insufficient documentation

## 2012-01-23 DIAGNOSIS — K623 Rectal prolapse: Secondary | ICD-10-CM

## 2012-01-23 MED ORDER — HYDROCORTISONE ACE-PRAMOXINE 2.5-1 % RE CREA: TOPICAL_CREAM | Freq: Four times a day (QID) | RECTAL | Status: AC

## 2012-01-23 NOTE — Progress Notes (Signed)
Subjective:     Patient ID: Stephanie Barber, female   DOB: [birth year], 71 y.o.   MRN: [id number]  HPI   Stephanie Barber  Jul 06, 1941 [id number]  Patient Care Team: Elias Else, MD as PCP - General (Family Medicine) Florencia Reasons, MD as Consulting Physician (Gastroenterology) Barbaraann Share, MD as Consulting Physician (Pulmonary Disease)  This patient is a 71 y.o.female who presents today for surgical evaluation.   Procedure: Laparoscopic sigmoid colectomy and rectopexy for rectal prolapse 12/05/2011.  No nausea or vomiting. Having bowel movements about 2 or 3 times a day. Good appetite.  Not on pain meds.  No problems with her incision.  Appetite good.  Energy level getting better.  She gets some occasional funny itching or tingling around the rectum on the inside. No pain or discomfort her symptoms during defecation. She tried some triple antibiotic ointment. It did not help. It's mildly annoying. No rectal bleeding.  She also feels sort of a pulling or resistance at the end of urination near the bladder. No burning. No foul odor. No frequency. It does not feel like a urinary tract infection.  Patient Active Problem List  Diagnoses  . Chronic cough  . 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 Late-onset lactose intolerance  . Diabetes mellitus  . Hypertension  . GERD (gastroesophageal reflux disease)    Past Medical History  Diagnosis Date  . Hypertension   . Hyperlipidemia   . Arthritis   . Diabetes mellitus   . Chronic headache   . Heart murmur   . GERD (gastroesophageal reflux disease)   . Rectal prolapse     Past Surgical History  Procedure Date  . Total abdominal hysterectomy   . Rotator cuff repair     Left  . Kidney surgery 1998    Right.  growth removed  . Back surgery   . Rectal prolapse repair 12/05/11    History   Social History  . Marital Status: Married    Spouse Name: N/A    Number of Children: 4  . Years  of Education: N/A   Occupational History  . 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    Social History Main Topics  . Smoking status: Former Games developer  . Smokeless tobacco: Never Used   Comment: quit in 1982  . Alcohol Use: No  . Drug Use: No  . Sexually Active: Not on file   Other Topics Concern  . Not on file   Social History Narrative  . No narrative on file    Family History  Problem Relation Age of Onset  . Heart disease Mother   . Throat cancer Father   . Cancer Father     stomach  . Kidney cancer Brother   . Heart disease Brother     Current outpatient prescriptions:acetaminophen (TYLENOL) 500 MG tablet, Take 500-1,000 mg by mouth every 6 (six) hours as needed. PAIN  , Disp: , Rfl: ;  amLODipine (NORVASC) 10 MG tablet, Take 10 mg by mouth daily after breakfast. , Disp: , Rfl: ;  aspirin 81 MG tablet, Take 81 mg by mouth at bedtime. , Disp: , Rfl: ;  BIOTIN PO, Take 50 mg by mouth 2 (two) times daily.  , Disp: , Rfl:  celecoxib (CELEBREX) 200 MG capsule, Take 200 mg by mouth daily. , Disp: , Rfl: ;  Cinnamon 500 MG capsule, Take 500 mg by mouth daily.  , Disp: , Rfl: ;  fish  oil-omega-3 fatty acids 1000 MG capsule, Take 1 g by mouth daily.  , Disp: , Rfl: ;  gabapentin (NEURONTIN) 400 MG capsule, Take 400-800 mg by mouth at bedtime. , Disp: , Rfl: ;  losartan (COZAAR) 100 MG tablet, Take 100 mg by mouth daily after breakfast. , Disp: , Rfl:  metFORMIN (GLUCOPHAGE) 500 MG tablet, Take 500 mg by mouth 2 (two) times daily with a meal. , Disp: , Rfl: ;  metoprolol (TOPROL-XL) 50 MG 24 hr tablet, Take 50 mg by mouth daily after breakfast. , Disp: , Rfl: ;  Polyethyl Glycol-Propyl Glycol (SYSTANE ULTRA) 0.4-0.3 % SOLN, Place 1 drop into both eyes daily.  , Disp: , Rfl: ;  polysaccharide iron (NIFEREX) 150 MG CAPS capsule, Take 150 mg by mouth daily. , Disp: , Rfl:  pravastatin (PRAVACHOL) 40 MG tablet, Take 40 mg by mouth daily after breakfast. , Disp: , Rfl: ;  ranitidine (ZANTAC)  300 MG capsule, Take 300 mg by mouth 2 (two) times daily. , Disp: , Rfl:   Allergies  Allergen Reactions  . Novocain (Procaine Hcl) Anaphylaxis  . Hydrocodone Diarrhea and Nausea And Vomiting  . Morphine And Related     BP 128/70  Pulse 72  Temp(Src) 97.1 F (36.2 C) (Temporal)  Resp 16  Ht 5' (1.524 m)  Wt 133 lb 9.6 oz (60.601 kg)  BMI 26.09 kg/m2     Review of Systems  Constitutional: Negative for fever, chills and diaphoresis.  HENT: Negative for ear pain, sore throat and trouble swallowing.   Eyes: Negative for photophobia and visual disturbance.  Respiratory: Negative for cough and choking.   Cardiovascular: Negative for chest pain and palpitations.  Gastrointestinal: Negative for nausea, vomiting, abdominal pain, diarrhea, constipation, blood in stool, abdominal distention, anal bleeding and rectal pain.       Rectal irritation  Genitourinary: Negative for dysuria, frequency and difficulty urinating.  Musculoskeletal: Negative for myalgias, back pain, arthralgias and gait problem.  Skin: Negative for color change, pallor and rash.  Neurological: Negative for dizziness, speech difficulty, weakness and numbness.  Hematological: Negative for adenopathy.  Psychiatric/Behavioral: Negative for confusion and agitation. The patient is not nervous/anxious.        Objective:   Physical Exam  Constitutional: She is oriented to person, place, and time. She appears well-developed and well-nourished. No distress.  HENT:  Head: Normocephalic.  Mouth/Throat: Oropharynx is clear and moist. No oropharyngeal exudate.  Eyes: Conjunctivae and EOM are normal. Pupils are equal, round, and reactive to light. No scleral icterus.  Neck: Normal range of motion. No tracheal deviation present.  Cardiovascular: Normal rate and intact distal pulses.   Pulmonary/Chest: Effort normal. No respiratory distress. She exhibits no tenderness.  Abdominal: Soft. She exhibits no distension and no mass.  There is no rebound and no guarding. Hernia confirmed negative in the right inguinal area and confirmed negative in the left inguinal area.       Incisions clean with normal healing ridges.  No hernias.   Genitourinary:    No vaginal discharge found.       Perianal skin clean with good hygiene.  No pruritis.  No pilonidal disease.  No fissure.  No abscess/fistula.    Tolerates digital rectal exam.  Normal sphincter tone.  No prolapse.  No proctitis  Musculoskeletal: Normal range of motion. She exhibits no edema.  Lymphadenopathy:       Right: No inguinal adenopathy present.       Left: No inguinal adenopathy present.  Neurological: She is alert and oriented to person, place, and time. No cranial nerve deficit. She exhibits normal muscle tone. Coordination normal.  Skin: Skin is warm and dry. No rash noted. She is not diaphoretic.  Psychiatric: She has a normal mood and affect. Her behavior is normal.       Assessment:     7 weeks s/p lap colectomy/pexy for rectal prolapse, recovering better    Plan:     Increase activity as tolerated.  Do not push through pain.  Use Aleve/Heat PRN  Advanced on diet as tolerated. Bowel regimen to avoid problems.    Try Kegel exercises for pelvic floor strengthening.  Check UA & see urologist if urine Sx not improved  Possible pruritis/proctitis.  Analpram to start.  Overall, she looks rather well.  Return to clinic PRN. The patient expressed understanding and appreciation

## 2012-01-23 NOTE — Patient Instructions (Signed)
Try pelvic floor exercises to strengthen the muscles & help with urination  If not better/worse, may need urine checked +/- see Urologist  Anal Pruritus Anal pruritus is when the area around the opening of the butt (anus) itches. This itching often happens when the area becomes moist. Moisture may be caused by sweating or a small amount of poop (stool) left on the area. Scratching can cause more skin damage. HOME CARE  Keep clean by washing your body well.   Clean the affected area with wet toilet paper, baby wipes, or a wet washcloth. Do this after you poop and at bedtime. Avoid using soaps on this area. Dry the area well. Pat the area dry.   Do not scrub the area with anything, even toilet paper.   Do not scratch the itchy area. Scratching makes the itching worse.   Take a warm water bath (sitz bath). Do this for 15 to 20 minutes, 2 to 3 times a day. Pat the area dry.   Use zinc oxide cream or a cream that keeps moisture away on the area.   Only take medicines as told by your doctor.   Talk to your doctor about fiber pills (supplements).   Wear cotton underwear and loose clothing.   Do not use bubble bath, scented toilet paper, or genital deodorants.  GET HELP RIGHT AWAY IF:  Your itching does not get better or it gets worse.   You have a fever.   You have more pain, puffiness (swelling), or redness.  MAKE SURE YOU:   Understand these instructions.   Will watch your condition.   Will get help right away if you are not doing well or get worse.  Document Released: 07/12/2011 Document Revised: 10/19/2011 Document Reviewed: 07/12/2011 [facility] Patient Information 2012 [place], [place].

## 2012-02-20 ENCOUNTER — Telehealth (INDEPENDENT_AMBULATORY_CARE_PROVIDER_SITE_OTHER): Payer: Self-pay | Admitting: General Surgery

## 2012-02-20 NOTE — Telephone Encounter (Signed)
Pt calling to report concerns about post op blood and how long is normal to expect or see it.  She is pt of Dr. Michaell Cowing who had colo-rectal surgery in late January of this year. She understands from her post op appts that there is still internal healing in progress.  With every BM, she wipe both fresh and dark blood, and fresh blood is wiped after sex.  Nothing drips or oozes from either orifice.  Please call her cell # [phone].

## 2012-02-21 NOTE — Telephone Encounter (Signed)
Some blood with wiping after a BM.  Blood s/p sex.  Just with wiping.  No blood on her underwear.  No need for pads.    She noticed a bleeding a few weeks ago around the time when she started her Celebrex for back pain flared. She noticed it after having intercourse that she had some bright blood. Just a few drops. It was a concern.  She stopped taking fiber. She was worried it was making her to harden. Energy level good. No severe bleeding. No abdominal pain. Appetite is good.  I recommended that she hold her Celebrex for 2 weeks. Tylenol for back pain p.r.n.  Get back on a fiber regimen. Half dose to 6 doses a day he is in the usual range. Drink more fluids and she tolerated. She noted that when her bowels were softer she did not have bleeding. She agreed to drive.  Hold off on any intercourse for months to allow the pelvis to not be stressed.  Noted that eventually the bleeding should go away and she should be able to get back on her Celebrex and have intercourse as tolerated. I suspect that'll be in 2-4 weeks. If it worsens or does not improve, I asked that she call me so we could maybe reevaluate things. However, it only been a few months so I suspect that the culprit or will be handled with these interventions. She expressed understanding and appreciation

## 2012-05-21 ENCOUNTER — Other Ambulatory Visit: Payer: Self-pay | Admitting: Family Medicine

## 2012-05-21 DIAGNOSIS — R51 Headache: Secondary | ICD-10-CM

## 2012-05-26 ENCOUNTER — Ambulatory Visit
Admission: RE | Admit: 2012-05-26 | Discharge: 2012-05-26 | Disposition: A | Discharge status: Home / Self Care | Payer: Medicare Other | Source: Ambulatory Visit | Attending: Family Medicine | Admitting: Family Medicine

## 2012-05-26 DIAGNOSIS — R51 Headache: Secondary | ICD-10-CM

## 2012-05-29 ENCOUNTER — Ambulatory Visit
Admission: RE | Admit: 2012-05-29 | Discharge: 2012-05-29 | Disposition: A | Discharge status: Home / Self Care | Payer: Medicare Other | Source: Ambulatory Visit | Attending: Family Medicine | Admitting: Family Medicine

## 2012-05-29 MED ORDER — GADOBENATE DIMEGLUMINE 529 MG/ML IV SOLN
12.0000 mL | Freq: Once | INTRAVENOUS | Status: AC | PRN
  Administered 2012-05-29: 12 mL via INTRAVENOUS

## 2012-06-01 ENCOUNTER — Inpatient Hospital Stay: Admission: RE | Admit: 2012-06-01 | Payer: Medicare Other | Source: Ambulatory Visit

## 2013-03-20 ENCOUNTER — Ambulatory Visit: Payer: Self-pay | Admitting: Neurology

## 2013-03-20 ENCOUNTER — Encounter: Payer: Self-pay | Admitting: Neurology

## 2013-06-30 ENCOUNTER — Telehealth: Payer: Self-pay | Admitting: Neurology

## 2013-07-01 NOTE — Telephone Encounter (Signed)
I have called her, she has had neck pain for 2 weeks, left side, will see chiropractor tomorrow,   Please give her an appt in next available. Last visit was in Jan 2014 by Dr. Vickey Huger

## 2013-08-07 ENCOUNTER — Ambulatory Visit: Payer: Self-pay | Admitting: Neurology

## 2013-08-29 ENCOUNTER — Ambulatory Visit: Payer: Self-pay | Admitting: Neurology

## 2013-11-04 ENCOUNTER — Telehealth: Payer: Self-pay | Admitting: Neurology

## 2013-11-04 NOTE — Telephone Encounter (Signed)
Per Dr. Oliva Bustard schedule, called patient to reschedule 01/21/14 appointment but first available was in July. Patient was rescheduled back in October as well and was not happy when I told her first available was in July. Please call the patient to schedule.

## 2013-11-04 NOTE — Telephone Encounter (Signed)
Patient has been rescheduled for f/u  Appt/ confirmed

## 2014-01-21 ENCOUNTER — Ambulatory Visit: Payer: Self-pay | Admitting: Neurology

## 2014-02-03 DIAGNOSIS — L650 Telogen effluvium: Secondary | ICD-10-CM | POA: Diagnosis not present

## 2014-02-03 DIAGNOSIS — L219 Seborrheic dermatitis, unspecified: Secondary | ICD-10-CM | POA: Diagnosis not present

## 2014-02-11 DIAGNOSIS — M9981 Other biomechanical lesions of cervical region: Secondary | ICD-10-CM | POA: Diagnosis not present

## 2014-02-11 DIAGNOSIS — R51 Headache: Secondary | ICD-10-CM | POA: Diagnosis not present

## 2014-02-11 DIAGNOSIS — M5030 Other cervical disc degeneration, unspecified cervical region: Secondary | ICD-10-CM | POA: Diagnosis not present

## 2014-02-11 DIAGNOSIS — M5412 Radiculopathy, cervical region: Secondary | ICD-10-CM | POA: Diagnosis not present

## 2014-02-12 DIAGNOSIS — M5412 Radiculopathy, cervical region: Secondary | ICD-10-CM | POA: Diagnosis not present

## 2014-02-12 DIAGNOSIS — M9981 Other biomechanical lesions of cervical region: Secondary | ICD-10-CM | POA: Diagnosis not present

## 2014-02-12 DIAGNOSIS — R51 Headache: Secondary | ICD-10-CM | POA: Diagnosis not present

## 2014-02-12 DIAGNOSIS — M5030 Other cervical disc degeneration, unspecified cervical region: Secondary | ICD-10-CM | POA: Diagnosis not present

## 2014-02-15 ENCOUNTER — Emergency Department (HOSPITAL_COMMUNITY): Payer: Medicare Other

## 2014-02-15 ENCOUNTER — Encounter (HOSPITAL_COMMUNITY): Payer: Self-pay | Admitting: Emergency Medicine

## 2014-02-15 ENCOUNTER — Emergency Department (HOSPITAL_COMMUNITY)
Admission: EM | Admit: 2014-02-15 | Discharge: 2014-02-15 | Disposition: A | Discharge status: Home / Self Care | Payer: Medicare Other | Attending: Emergency Medicine | Admitting: Emergency Medicine

## 2014-02-15 DIAGNOSIS — I672 Cerebral atherosclerosis: Secondary | ICD-10-CM | POA: Diagnosis not present

## 2014-02-15 DIAGNOSIS — Y9389 Activity, other specified: Secondary | ICD-10-CM | POA: Insufficient documentation

## 2014-02-15 DIAGNOSIS — G4733 Obstructive sleep apnea (adult) (pediatric): Secondary | ICD-10-CM | POA: Insufficient documentation

## 2014-02-15 DIAGNOSIS — S0993XA Unspecified injury of face, initial encounter: Secondary | ICD-10-CM | POA: Diagnosis not present

## 2014-02-15 DIAGNOSIS — S1093XA Contusion of unspecified part of neck, initial encounter: Principal | ICD-10-CM

## 2014-02-15 DIAGNOSIS — M129 Arthropathy, unspecified: Secondary | ICD-10-CM | POA: Diagnosis not present

## 2014-02-15 DIAGNOSIS — E7800 Pure hypercholesterolemia, unspecified: Secondary | ICD-10-CM | POA: Insufficient documentation

## 2014-02-15 DIAGNOSIS — I10 Essential (primary) hypertension: Secondary | ICD-10-CM | POA: Insufficient documentation

## 2014-02-15 DIAGNOSIS — Z7982 Long term (current) use of aspirin: Secondary | ICD-10-CM | POA: Diagnosis not present

## 2014-02-15 DIAGNOSIS — E785 Hyperlipidemia, unspecified: Secondary | ICD-10-CM | POA: Diagnosis not present

## 2014-02-15 DIAGNOSIS — IMO0002 Reserved for concepts with insufficient information to code with codable children: Secondary | ICD-10-CM | POA: Diagnosis not present

## 2014-02-15 DIAGNOSIS — Z79899 Other long term (current) drug therapy: Secondary | ICD-10-CM | POA: Diagnosis not present

## 2014-02-15 DIAGNOSIS — E119 Type 2 diabetes mellitus without complications: Secondary | ICD-10-CM | POA: Diagnosis not present

## 2014-02-15 DIAGNOSIS — Y9229 Other specified public building as the place of occurrence of the external cause: Secondary | ICD-10-CM | POA: Insufficient documentation

## 2014-02-15 DIAGNOSIS — S0081XA Abrasion of other part of head, initial encounter: Secondary | ICD-10-CM

## 2014-02-15 DIAGNOSIS — W108XXA Fall (on) (from) other stairs and steps, initial encounter: Secondary | ICD-10-CM | POA: Insufficient documentation

## 2014-02-15 DIAGNOSIS — K219 Gastro-esophageal reflux disease without esophagitis: Secondary | ICD-10-CM | POA: Diagnosis not present

## 2014-02-15 DIAGNOSIS — S0003XA Contusion of scalp, initial encounter: Secondary | ICD-10-CM | POA: Insufficient documentation

## 2014-02-15 DIAGNOSIS — G47411 Narcolepsy with cataplexy: Secondary | ICD-10-CM | POA: Diagnosis not present

## 2014-02-15 DIAGNOSIS — Z87891 Personal history of nicotine dependence: Secondary | ICD-10-CM | POA: Insufficient documentation

## 2014-02-15 DIAGNOSIS — S0083XA Contusion of other part of head, initial encounter: Secondary | ICD-10-CM | POA: Insufficient documentation

## 2014-02-15 NOTE — ED Provider Notes (Signed)
Medical screening examination/treatment/procedure(s) were conducted as a shared visit with non-physician practitioner(s) and myself.  I personally evaluated the patient during the encounter.   EKG Interpretation None      Patient fell after she tripped down a step at church. Neurological exam is nonfocal. Head CT is without intracerebral hemorrhage. Stable for discharge  Leota Jacobsen, MD 02/15/14 [phone]

## 2014-02-15 NOTE — ED Notes (Signed)
Pt c/o pain in forehead and R eye after a fall.  Pain score 8/10.  Pt reports that she was walking down steps at church, missed a step, and fell.  Pt is unable to tell me what her head hit.  Pt sts "I just want to sleep."  Swelling and abrasion noted to R eye and hematoma noted to R forehead.  No blood thinners.

## 2014-02-15 NOTE — Discharge Instructions (Signed)
Return here as needed.  Followup with your Dr. for recheck.  Use ice, on your forehead

## 2014-02-15 NOTE — ED Notes (Signed)
Initial Contact - pt to [facility]4 with family, reports tripping down a step and falling in church this AM approx 1030.  Pt denies LOC.  Pt denies CP, palpitations, dizziness or weakness prior to event or at this time.  Pt with hematoma and small abrasion to forehead, small abrasion noted to R cheek/eye also.  No bleeding.  Pt reports came to ER this afternoon "because my daughter told me to".  Pt c/o mild headache, denies other complaints.  Skin otherwise PWD.  Speaking full/clear sentences.  Neuros grossly intact.  NAD.

## 2014-02-15 NOTE — ED Provider Notes (Signed)
CSN: [id number]     Arrival date & time 02/15/14  1409 History   First MD Initiated Contact with Patient 02/15/14 1507     Chief Complaint  Patient presents with  . Fall  . Head Injury     (Consider location/radiation/quality/duration/timing/severity/associated sxs/prior Treatment) Patient is a 73 y.o. female presenting with fall and head injury.  Fall  Head Injury  Patient presents to the emergency department following a fall that occurred earlier this afternoon.  Patient, states she was walking down a the stairs, when she stumbled and fell, scraping her face and hitting her forehead.  Patient, states, that she did not lose consciousness.  The patient denies chest pain, shortness breath, nausea, vomiting, headache, blurred vision, weakness, dizziness, back pain, abdominal pain, or syncope.  Patient, states nothing seems to make her condition, better.Palpation of her forehead makes the pain, worse. Past Medical History  Diagnosis Date  . Hypertension   . Hyperlipidemia   . Arthritis   . Diabetes mellitus   . Chronic headache   . Heart murmur   . GERD (gastroesophageal reflux disease)   . Rectal prolapse   . Narcolepsy and cataplexy   . Migraine   . Intracranial atherosclerosis     per MRA  . High cholesterol   . OSA on CPAP     residual AHI of-1.1   Past Surgical History  Procedure Laterality Date  . Total abdominal hysterectomy    . Rotator cuff repair      Left  . Kidney surgery  1998    Right.  growth removed  . Back surgery      L4,L5 discectomy  . Rectal prolapse repair  12/05/11  . Abdominal hysterectomy     Family History  Problem Relation Age of Onset  . Heart disease Mother   . Throat cancer Father   . Cancer Father     stomach  . Kidney cancer Brother   . Heart disease Brother    History  Substance Use Topics  . Smoking status: Former Smoker    Quit date: 11/13/1960  . Smokeless tobacco: Never Used     Comment: quit in 1982  . Alcohol Use: No    OB History   Grav Para Term Preterm Abortions TAB SAB Ect Mult Living                 Review of Systems  All other systems negative except as documented in the HPI. All pertinent positives and negatives as reviewed in the HPI.  Allergies  Novocain; Hydrocodone; and Morphine and related  Home Medications   Current Outpatient Rx  Name  Route  Sig  Dispense  Refill  . acetaminophen (TYLENOL) 500 MG tablet   Oral   Take 500-1,000 mg by mouth every 6 (six) hours as needed. PAIN           . amLODipine (NORVASC) 10 MG tablet   Oral   Take 10 mg by mouth daily after breakfast.          . Armodafinil (NUVIGIL) 250 MG tablet   Oral   Take 250 mg by mouth daily. Take in mornings         . aspirin 81 MG tablet   Oral   Take 81 mg by mouth at bedtime.          Marland Kitchen BIOTIN PO   Oral   Take 50 mg by mouth 2 (two) times daily.           Marland Kitchen  celecoxib (CELEBREX) 200 MG capsule   Oral   Take 200 mg by mouth daily.          . Cinnamon 500 MG capsule   Oral   Take 500 mg by mouth daily.           . fish oil-omega-3 fatty acids 1000 MG capsule   Oral   Take 1 g by mouth daily.           Marland Kitchen gabapentin (NEURONTIN) 400 MG capsule   Oral   Take 400 mg by mouth at bedtime.          Marland Kitchen losartan (COZAAR) 100 MG tablet   Oral   Take 100 mg by mouth daily after breakfast.          . metFORMIN (GLUCOPHAGE) 500 MG tablet   Oral   Take 500 mg by mouth 2 (two) times daily with a meal.          . metoprolol (TOPROL-XL) 50 MG 24 hr tablet   Oral   Take 50 mg by mouth daily after breakfast.          . Polyethyl Glycol-Propyl Glycol (SYSTANE ULTRA) 0.4-0.3 % SOLN   Both Eyes   Place 1 drop into both eyes daily.           . polysaccharide iron (NIFEREX) 150 MG CAPS capsule   Oral   Take 150 mg by mouth daily.          . pravastatin (PRAVACHOL) 40 MG tablet   Oral   Take 40 mg by mouth daily after breakfast.          . ranitidine (ZANTAC) 300 MG  capsule   Oral   Take 300 mg by mouth 2 (two) times daily.           BP 141/72  Pulse 70  Temp(Src) 97.9 F (36.6 C) (Oral)  Resp 14  SpO2 97% Physical Exam  Nursing note and vitals reviewed. Constitutional: She is oriented to person, place, and time. She appears well-developed and well-nourished. No distress.  HENT:  Head: Normocephalic.    Right Ear: Tympanic membrane normal. No hemotympanum.  Left Ear: Tympanic membrane normal. No hemotympanum.  Nose: Nose normal.  Mouth/Throat: Uvula is midline and oropharynx is clear and moist.  Neck: Normal range of motion. Neck supple.  Cardiovascular: Normal rate, regular rhythm and normal heart sounds.   Pulmonary/Chest: Effort normal and breath sounds normal.  Musculoskeletal:       Cervical back: She exhibits tenderness. She exhibits normal range of motion, no bony tenderness and no swelling.       Thoracic back: Normal.       Lumbar back: Normal.  Neurological: She is alert and oriented to person, place, and time. She has normal strength. No sensory deficit. She exhibits normal muscle tone. Coordination and gait normal.    ED Course  Procedures (including critical care time) Labs Review Labs Reviewed - No data to display Imaging Review Ct Head Wo Contrast  02/15/2014   CLINICAL DATA:  [place].  Hit head.  EXAM: CT HEAD WITHOUT CONTRAST  CT MAXILLOFACIAL WITHOUT CONTRAST  CT CERVICAL SPINE WITHOUT CONTRAST  TECHNIQUE: Multidetector CT imaging of the head, cervical spine, and maxillofacial structures were performed using the standard protocol without intravenous contrast. Multiplanar CT image reconstructions of the cervical spine and maxillofacial structures were also generated.  COMPARISON:  MRI brain 05/29/2012  FINDINGS: CT HEAD FINDINGS  There is a right  frontal scalp hematoma without underlying skull fracture. Mild hyperostosis frontalis interna is noted.  The ventricles are normal in size and configuration and in the midline  without mass effect or shift. No extra-axial fluid collections are identified. No CT findings for acute hemispheric infarction an or intracranial hemorrhage. No mass lesions. The brainstem and cerebellum are grossly normal.  The paranasal sinuses and mastoid air cells are clear. The globes are intact.  CT MAXILLOFACIAL FINDINGS  No acute facial bone fractures are identified. The mandibular condyles are normally aligned. No mandible fracture. The paranasal sinuses and mastoid air cells are clear. The globes are intact.  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 The overall alignment is maintained. No acute fractures identified. Multilevel Schmorl's nodes are noted. Advanced degenerative changes at C1-2 with posterior pannus formation which is partially calcified but no significant mass effect on the upper cervical cord. The spinal canal is generous and there is no significant spinal or foraminal stenosis.  IMPRESSION: 1. Right frontal scalp hematoma without underlying skull fracture. 2. No acute intracranial findings. 3. No facial bone fractures. 4. 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   Electronically Signed   By: Kalman Jewels M.D.   On: 02/15/2014 16:33   Ct Cervical Spine Wo Contrast  02/15/2014   CLINICAL DATA:  [place].  Hit head.  EXAM: CT HEAD WITHOUT CONTRAST  CT MAXILLOFACIAL WITHOUT CONTRAST  CT CERVICAL SPINE WITHOUT CONTRAST  TECHNIQUE: Multidetector CT imaging of the head, cervical spine, and maxillofacial structures were performed using the standard protocol without intravenous contrast. Multiplanar CT image reconstructions of the cervical spine and maxillofacial structures were also generated.  COMPARISON:  MRI brain 05/29/2012  FINDINGS: CT HEAD FINDINGS  There is a right frontal scalp hematoma without underlying skull fracture. Mild hyperostosis frontalis interna is noted.   The ventricles are normal in size and configuration and in the midline without mass effect or shift. No extra-axial fluid collections are identified. No CT findings for acute hemispheric infarction an or intracranial hemorrhage. No mass lesions. The brainstem and cerebellum are grossly normal.  The paranasal sinuses and mastoid air cells are clear. The globes are intact.  CT MAXILLOFACIAL FINDINGS  No acute facial bone fractures are identified. The mandibular condyles are normally aligned. No mandible fracture. The paranasal sinuses and mastoid air cells are clear. The globes are intact.  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 The overall alignment is maintained. No acute fractures identified. Multilevel Schmorl's nodes are noted. Advanced degenerative changes at C1-2 with posterior pannus formation which is partially calcified but no significant mass effect on the upper cervical cord. The spinal canal is generous and there is no significant spinal or foraminal stenosis.  IMPRESSION: 1. Right frontal scalp hematoma without underlying skull fracture. 2. No acute intracranial findings. 3. No facial bone fractures. 4. 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   Electronically Signed   By: Kalman Jewels M.D.   On: 02/15/2014 16:33   Ct Maxillofacial Wo Cm  02/15/2014   CLINICAL DATA:  [place].  Hit head.  EXAM: CT HEAD WITHOUT CONTRAST  CT MAXILLOFACIAL WITHOUT CONTRAST  CT CERVICAL SPINE WITHOUT CONTRAST  TECHNIQUE: Multidetector CT imaging of the head, cervical spine, and maxillofacial structures were performed using the standard protocol without intravenous contrast. Multiplanar CT image reconstructions of the cervical  spine and maxillofacial structures were also generated.  COMPARISON:  MRI brain 05/29/2012  FINDINGS: CT HEAD FINDINGS  There is a right frontal scalp hematoma without  underlying skull fracture. Mild hyperostosis frontalis interna is noted.  The ventricles are normal in size and configuration and in the midline without mass effect or shift. No extra-axial fluid collections are identified. No CT findings for acute hemispheric infarction an or intracranial hemorrhage. No mass lesions. The brainstem and cerebellum are grossly normal.  The paranasal sinuses and mastoid air cells are clear. The globes are intact.  CT MAXILLOFACIAL FINDINGS  No acute facial bone fractures are identified. The mandibular condyles are normally aligned. No mandible fracture. The paranasal sinuses and mastoid air cells are clear. The globes are intact.  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 The overall alignment is maintained. No acute fractures identified. Multilevel Schmorl's nodes are noted. Advanced degenerative changes at C1-2 with posterior pannus formation which is partially calcified but no significant mass effect on the upper cervical cord. The spinal canal is generous and there is no significant spinal or foraminal stenosis.  IMPRESSION: 1. Right frontal scalp hematoma without underlying skull fracture. 2. No acute intracranial findings. 3. No facial bone fractures. 4. 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   Electronically Signed   By: Kalman Jewels M.D.   On: 02/15/2014 16:33    Patient does not have any neurological deficits noted on exam.  Her CT scans are negative.  Advised her to use ice, on her forehead to help reduce the swelling.  The patient is advised followup with her primary care Dr. also advised her to return here for worsening in her condition.  Patient was explained results of her tests   Brent General, PA-C 02/15/14 [phone]

## 2014-02-17 DIAGNOSIS — S1093XA Contusion of unspecified part of neck, initial encounter: Secondary | ICD-10-CM | POA: Diagnosis not present

## 2014-02-17 DIAGNOSIS — S0083XA Contusion of other part of head, initial encounter: Secondary | ICD-10-CM | POA: Diagnosis not present

## 2014-02-17 DIAGNOSIS — S0003XA Contusion of scalp, initial encounter: Secondary | ICD-10-CM | POA: Diagnosis not present

## 2014-02-17 NOTE — ED Provider Notes (Signed)
Medical screening examination/treatment/procedure(s) were conducted as a shared visit with non-physician practitioner(s) and myself.  I personally evaluated the patient during the encounter.   EKG Interpretation None       Leota Jacobsen, MD 02/17/14 1728

## 2014-02-25 DIAGNOSIS — M5412 Radiculopathy, cervical region: Secondary | ICD-10-CM | POA: Diagnosis not present

## 2014-02-25 DIAGNOSIS — M9981 Other biomechanical lesions of cervical region: Secondary | ICD-10-CM | POA: Diagnosis not present

## 2014-02-25 DIAGNOSIS — M5030 Other cervical disc degeneration, unspecified cervical region: Secondary | ICD-10-CM | POA: Diagnosis not present

## 2014-02-25 DIAGNOSIS — R51 Headache: Secondary | ICD-10-CM | POA: Diagnosis not present

## 2014-02-27 DIAGNOSIS — M5412 Radiculopathy, cervical region: Secondary | ICD-10-CM | POA: Diagnosis not present

## 2014-02-27 DIAGNOSIS — R51 Headache: Secondary | ICD-10-CM | POA: Diagnosis not present

## 2014-02-27 DIAGNOSIS — M5030 Other cervical disc degeneration, unspecified cervical region: Secondary | ICD-10-CM | POA: Diagnosis not present

## 2014-02-27 DIAGNOSIS — M9981 Other biomechanical lesions of cervical region: Secondary | ICD-10-CM | POA: Diagnosis not present

## 2014-03-05 ENCOUNTER — Ambulatory Visit (INDEPENDENT_AMBULATORY_CARE_PROVIDER_SITE_OTHER): Payer: Self-pay | Admitting: Neurology

## 2014-03-05 ENCOUNTER — Encounter: Payer: Self-pay | Admitting: Neurology

## 2014-03-05 VITALS — BP 121/69 | HR 65 | Ht 61.0 in | Wt 132.0 lb

## 2014-03-05 DIAGNOSIS — Z91199 Patient's noncompliance with other medical treatment and regimen due to unspecified reason: Secondary | ICD-10-CM | POA: Insufficient documentation

## 2014-03-05 DIAGNOSIS — Z9119 Patient's noncompliance with other medical treatment and regimen: Secondary | ICD-10-CM | POA: Insufficient documentation

## 2014-03-05 NOTE — Progress Notes (Signed)
This patient of Dr. Rhea Belton is supposed to be seen for a CPAP compliance visit , following a sleep study in Dec 2013 .   She forgot to bring her machine and stated she had fallen , bruised her face and couldn't use the mask due to the facial swelling.  She has not addressed this with Dr Krista Blue or me, nor informed the DME.  I asked her to schedule an appointment with Dr. Krista Blue for a concussion evaluation, she had lost awareness after the fall. She vomited a day later.

## 2014-03-05 NOTE — Patient Instructions (Signed)

## 2014-03-13 DIAGNOSIS — M5030 Other cervical disc degeneration, unspecified cervical region: Secondary | ICD-10-CM | POA: Diagnosis not present

## 2014-03-13 DIAGNOSIS — M5412 Radiculopathy, cervical region: Secondary | ICD-10-CM | POA: Diagnosis not present

## 2014-03-13 DIAGNOSIS — R51 Headache: Secondary | ICD-10-CM | POA: Diagnosis not present

## 2014-03-13 DIAGNOSIS — M9981 Other biomechanical lesions of cervical region: Secondary | ICD-10-CM | POA: Diagnosis not present

## 2014-03-26 DIAGNOSIS — L739 Follicular disorder, unspecified: Secondary | ICD-10-CM | POA: Diagnosis not present

## 2014-03-26 DIAGNOSIS — L679 Hair color and hair shaft abnormality, unspecified: Secondary | ICD-10-CM | POA: Diagnosis not present

## 2014-04-02 ENCOUNTER — Encounter: Payer: Self-pay | Admitting: Neurology

## 2014-04-02 ENCOUNTER — Ambulatory Visit (INDEPENDENT_AMBULATORY_CARE_PROVIDER_SITE_OTHER): Payer: Medicare Other | Admitting: Neurology

## 2014-04-02 VITALS — BP 123/67 | HR 64 | Ht 61.0 in | Wt 133.5 lb

## 2014-04-02 DIAGNOSIS — G4733 Obstructive sleep apnea (adult) (pediatric): Secondary | ICD-10-CM | POA: Diagnosis not present

## 2014-04-02 DIAGNOSIS — G4710 Hypersomnia, unspecified: Secondary | ICD-10-CM | POA: Diagnosis not present

## 2014-04-02 DIAGNOSIS — I10 Essential (primary) hypertension: Secondary | ICD-10-CM

## 2014-04-02 NOTE — Progress Notes (Signed)
PATIENT: Stephanie Barber DOB: Sep 20, 1941  HISTORICAL  Stephanie Barber  is a 73 years old right-handed African American female, accompanied by her daughter, referred by her primary care physician Dr. [name] Read for evaluation of transient sharp headaches. She was initially seen 06/19/12 by Dr. Krista Blue.   She has past medical history of migraine headaches, hypertension, hyperlipidemia, diabetes for more than 1 decade, she suffered iron deficiency anemia in 2012, hemoglobin was 9.5, now normalized with iron supplement, her A1c was 10 in 2012, most recent was 5.9 she also has diabetic peripheral neuropathy.  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  She had sleep study in August 2013, showed mild of shotting sleep apnea, poor sleep efficiency, prolonged latency to REM sleep, she is using CPAP machine, Has been followed up by Dr. Brett Fairy a few times, the most recent followup was in April 2015, but she forgot to bring her CPAP machine,  She also underwent PS and MSLT - MSLT had one  SREMs and an average time to fall asleep of  *.8 minutes , her Ewort is 20 points,: facit: this patient has severe hypersomnia and is at risk when driving. medication in form ogf nuvigil samples had been dispensed to her but she ha not yet taken it.  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   UPDATE Apr 02 2014: She has hard time keep things together,  She use CPAP,  She forget machine at her scheduled visit with Dr. Brett Fairy in April 2015, she fell at [facility] Sunday, fell facefoward to her right frontal area, may be transient loss of consciousness, went to [facility] ED, Ct showed Right frontal scalp hematoma without underlying skull fracture. No acute intracranial findings.  No facial bone fractures.  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  She still complains of right frontal area hard nodule, now she is wearing CPAP again,  She continues to complain excessive daytime sleepiness, fatigue, forgetful, still driving to clinic today, without getting loss, today's Mini-Mental Status Examination is 29 out of 30  Per patient, she had laboratory evaluation by her primary care physician about a month ago, reported normal TSH, B12, and rest of the laboratory evaluation,  The most bothersome symptoms for her are excessive daytime sleepiness, ESS is 19, FSS 27, she denies headaches, no lateralized motor or sensory deficit, no gait difficulty, still exercising daily,  Review of Systems  Out of a complete 14 system review, the patient complains of only the following symptoms, and all other reviewed systems are negative.  Apnea, muscle cramps   ALLERGIES: Allergies  Allergen Reactions  . Novocain [Procaine Hcl] Anaphylaxis  . Hydrocodone Diarrhea and Nausea And Vomiting  . Morphine And Related     Unknown.      HOME MEDICATIONS: Current Outpatient Prescriptions on File Prior to Visit  Medication Sig  Dispense Refill  . acetaminophen (TYLENOL) 500 MG tablet Take 500-1,000 mg by mouth every 6 (six) hours as needed. PAIN        . amLODipine (NORVASC) 10 MG tablet Take 10 mg by mouth daily after breakfast.       . Armodafinil (NUVIGIL) 250 MG tablet Take 250 mg by mouth daily. Take in mornings      . aspirin 81 MG tablet Take  81 mg by mouth at bedtime.       Marland Kitchen BIOTIN PO Take 50 mg by mouth 2 (two) times daily.        . celecoxib (CELEBREX) 200 MG capsule Take 200 mg by mouth daily.       . Cinnamon 500 MG capsule Take 500 mg by mouth daily.        . fish oil-omega-3 fatty acids 1000 MG capsule Take 1 g by mouth daily.        Marland Kitchen gabapentin (NEURONTIN) 400 MG capsule Take 400 mg by mouth at bedtime.       Marland Kitchen losartan (COZAAR) 100 MG tablet Take 100 mg by mouth daily after breakfast.       . metFORMIN (GLUCOPHAGE) 500 MG tablet Take 500 mg by mouth 2 (two) times daily with a meal.       . metoprolol (TOPROL-XL) 50 MG 24 hr tablet Take 50 mg by mouth daily after breakfast.       . Polyethyl Glycol-Propyl Glycol (SYSTANE ULTRA) 0.4-0.3 % SOLN Place 1 drop into both eyes daily.        . polysaccharide iron (NIFEREX) 150 MG CAPS capsule Take 150 mg by mouth daily.       . pravastatin (PRAVACHOL) 40 MG tablet Take 40 mg by mouth daily after breakfast.       . ranitidine (ZANTAC) 300 MG capsule Take 300 mg by mouth 2 (two) times daily.        No current facility-administered medications on file prior to visit.    PAST MEDICAL HISTORY: Past Medical History  Diagnosis Date  . Hypertension   . Hyperlipidemia   . Arthritis   . Diabetes mellitus   . Chronic headache   . Heart murmur   . GERD (gastroesophageal reflux disease)   . Rectal prolapse   . Narcolepsy and cataplexy   . Migraine   . Intracranial atherosclerosis     per MRA  . High cholesterol   . OSA on CPAP     residual AHI of-1.1    PAST SURGICAL HISTORY: Past Surgical History  Procedure Laterality Date  . Total abdominal hysterectomy    . Rotator cuff repair      Left  . Kidney surgery  1998    Right.  growth removed  . Back surgery      L4,L5 discectomy  . Rectal prolapse repair  12/05/11  . Abdominal hysterectomy      FAMILY HISTORY: Family History  Problem Relation Age of Onset  . Heart disease Mother   . Throat cancer Father   . Cancer  Father     stomach  . Kidney cancer Brother   . Heart disease Brother     SOCIAL HISTORY:  History   Social History  . Marital Status: Married    Spouse Name: [name]    Number of Children: 4  . Years of Education: 12   Occupational History  . retired. prev worked at [employer].    Social History Main Topics  .  Smoking status: Former Smoker    Quit date: 11/13/1980  . Smokeless tobacco: Never Used     Comment: quit in 1982  . Alcohol Use: No  . Drug Use: No  . Sexual Activity: Not on file   Other Topics Concern  . Not on file   Social History Narrative   21 st January 2014 , patient underwent PS and MSLT - MSLT had one  SREMs and an average time to fall asleep of  *.8 minutes , her Ewort is 20 points,: facit: this patient has severe hypersomnia and is at risk when driving. medication in form ogf nuvigil smaples had been dispensed to her but she ha not yet taken it. 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 In detail -discussion of diagnosis and treatment  takes place today , first with nuvigil and if insufficient, with XYREM. Her CPAP treats her OSA very well, and she uses it 6 hours  or more each night,  residual AHI of 1.1  would not allow for OSA to be still explaining this degree of sleepiness. 2 downloads were reviewed. labs reviewed.               PHYSICAL EXAM   Filed Vitals:   04/02/14 1018  BP: 123/67  Pulse: 64  Height: 5\' 1"  (1.549 m)  Weight: 133 lb 8 oz (60.555 kg)    Not recorded    Body mass index is 25.24 kg/(m^2).   Generalized: In no acute distress  Neck: Supple, no carotid bruits   Cardiac: Regular rate rhythm  Pulmonary: Clear to auscultation bilaterally  Musculoskeletal: No deformity  Neurological examination  Mentation: Alert oriented to time, place, history taking, and  causual conversation, MMSE 29/30, missed 1/3 recall,   Cranial nerve II-XII: Pupils were equal round reactive to light. Extraocular movements were full.  Visual field were full on confrontational test. Bilateral fundi were sharp.  Facial sensation and strength were normal. Hearing was intact to finger rubbing bilaterally. Uvula tongue midline.  Head turning and shoulder shrug and were normal and symmetric.Tongue protrusion into cheek strength was normal.  Motor: Normal tone, bulk and strength.  Sensory: Intact to fine touch, pinprick, preserved vibratory sensation, and proprioception at toes.  Coordination: Normal finger to nose, heel-to-shin bilaterally there was no truncal ataxia  Gait: Rising up from seated position without assistance, normal stance, without trunk ataxia, moderate stride, good arm swing, smooth turning, able to perform tiptoe, and heel walking without difficulty.   Romberg signs: Negative  Deep tendon reflexes: Brachioradialis 2/2, biceps 2/2, triceps 2/2, patellar 2/2, Achilles 2/2, plantar responses were flexor bilaterally.   DIAGNOSTIC DATA (LABS, IMAGING, TESTING) - I reviewed patient records, labs, notes, testing and imaging myself where available.  Lab Results  Component Value Date   WBC 7.9 12/07/2011   HGB 10.2* 12/08/2011   HCT 27.3* 12/07/2011   MCV 79.8 12/07/2011   PLT 164 12/07/2011      Component Value Date/Time   NA 135 12/06/2011 0426   K 4.0 12/06/2011 0426   CL 104 12/06/2011 0426   CO2 24 12/06/2011 0426   GLUCOSE 134* 12/06/2011 0426   BUN 10 12/06/2011 0426   CREATININE 0.71 12/06/2011 0426   CALCIUM 8.3* 12/06/2011 0426   PROT 7.3 05/20/2009 1400   ALBUMIN 3.9 05/20/2009 1400  AST 27 05/20/2009 1400   ALT 23 05/20/2009 1400   ALKPHOS 65 05/20/2009 1400   BILITOT 0.4 05/20/2009 1400   GFRNONAA 85* 12/06/2011 0426   GFRAA >90 12/06/2011 0426    ASSESSMENT AND PLAN  PORCHIA SINKLER is a 73 y.o. female complains of  excessive sleepiness, fell in early  April 2015, has much improved, the most bothersome symptoms for her to discontinue excessive daytime sleepiness, she only has mild memory trouble, can be related to her hypersomnolence,    she is to continue followup with Dr. Brett Fairy   Marcial Pacas, M.D. Ph.D.  [facility] [address] [place], [place] [address] [phone]

## 2014-04-15 ENCOUNTER — Other Ambulatory Visit: Payer: Self-pay | Admitting: *Deleted

## 2014-04-15 DIAGNOSIS — G4733 Obstructive sleep apnea (adult) (pediatric): Secondary | ICD-10-CM

## 2014-05-28 DIAGNOSIS — L739 Follicular disorder, unspecified: Secondary | ICD-10-CM | POA: Diagnosis not present

## 2014-05-28 DIAGNOSIS — L679 Hair color and hair shaft abnormality, unspecified: Secondary | ICD-10-CM | POA: Diagnosis not present

## 2014-06-02 ENCOUNTER — Telehealth: Payer: Self-pay | Admitting: *Deleted

## 2014-06-05 NOTE — Telephone Encounter (Signed)
I called pt and she needed appt.  I told her she had appt in 07-28-14 at 1300 with Dr. Brett Fairy.  She confirmed this.  No need for earlier appt.

## 2014-06-10 DIAGNOSIS — Z23 Encounter for immunization: Secondary | ICD-10-CM | POA: Diagnosis not present

## 2014-06-10 DIAGNOSIS — E785 Hyperlipidemia, unspecified: Secondary | ICD-10-CM | POA: Diagnosis not present

## 2014-06-10 DIAGNOSIS — R209 Unspecified disturbances of skin sensation: Secondary | ICD-10-CM | POA: Diagnosis not present

## 2014-06-10 DIAGNOSIS — I10 Essential (primary) hypertension: Secondary | ICD-10-CM | POA: Diagnosis not present

## 2014-06-10 DIAGNOSIS — K219 Gastro-esophageal reflux disease without esophagitis: Secondary | ICD-10-CM | POA: Diagnosis not present

## 2014-06-10 DIAGNOSIS — E1149 Type 2 diabetes mellitus with other diabetic neurological complication: Secondary | ICD-10-CM | POA: Diagnosis not present

## 2014-06-10 DIAGNOSIS — M545 Low back pain, unspecified: Secondary | ICD-10-CM | POA: Diagnosis not present

## 2014-07-28 ENCOUNTER — Ambulatory Visit (INDEPENDENT_AMBULATORY_CARE_PROVIDER_SITE_OTHER): Payer: Medicare Other | Admitting: Neurology

## 2014-07-28 ENCOUNTER — Encounter: Payer: Self-pay | Admitting: Neurology

## 2014-07-28 VITALS — BP 122/68 | HR 60 | Resp 16 | Ht 61.0 in | Wt 131.0 lb

## 2014-07-28 DIAGNOSIS — Z9989 Dependence on other enabling machines and devices: Secondary | ICD-10-CM

## 2014-07-28 DIAGNOSIS — G4733 Obstructive sleep apnea (adult) (pediatric): Secondary | ICD-10-CM | POA: Diagnosis not present

## 2014-07-28 DIAGNOSIS — Z7189 Other specified counseling: Secondary | ICD-10-CM | POA: Insufficient documentation

## 2014-07-28 DIAGNOSIS — J322 Chronic ethmoidal sinusitis: Secondary | ICD-10-CM

## 2014-07-28 NOTE — Progress Notes (Signed)
PATIENT: Stephanie Barber DOB: 1941/06/21  HISTORICAL   Interval history ; the patient is followed by me, Dr. Asencion Partridge Stephanie Barber, for sleep related issues only -she underwent a sleep study on 07-04-12 after her Epworth sleepiness score was endorsed at 21 points.  Physician was Dr. Krista Blue.   Epworth sleepiness score at 12 points,  fatigue severity at 19 points , and geriatric depression score at 2  points. She is doing much better - with repeated a Mini-Mental status exam  Was 28/30 and she scored 64/30 points last April..  As the compliance for the last 30 days was measured at 6 hours and 22 minutes daily-and  Median daily usage was  5 hours 59 minutes,  residual AHI is 2.1 pressure is 6 cm water was 1 cm EPR . the patient has a moderate air leak. The patient has not used CPAP  After May until August  As was initially prescribed but has now begun to be compliant - she reports the headaches have kept her from using it.  This was after the fall at [place] of this year and. I think that it is time for her to resume a regular use of CPAP and I would like for her to be evaluated for possible sinusitis.  I know she had a CT scan when she came to the hospital emergency room after the fall,  which did not show any sinus related issues at the time. I will order a Sinus CT and have her follow  up with Dr Krista Blue / NP.  Memory is not affected at this point. MOCA should be used for next visit.     Last visit Dr Krista Blue :   Stephanie Barber  is a 73 years old right-handed African American female, accompanied by her daughter, referred by her primary care physician Dr. Herbie Barber Read for evaluation of transient sharp headaches. She was initially seen 06/19/12 by Dr.  Marcial Pacas.   She has past medical history of migraine headaches, hypertension, hyperlipidemia, diabetes for more than 1 decade, she suffered iron deficiency anemia in 2012, hemoglobin was 9.5, now normalized with iron supplement, her A1c was 10 in 2012, most  recent was 5.9 she also has diabetic peripheral neuropathy.  Since May 2013, she had frequent episode of sharp transient shooting pain starting from her occipital region spreading forward to her right forehead, it is usually involving the right side, occasionally on the left side, lasting a few seconds, no loss of consciousness, no lingering pain, EEG normal.  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 She had sleep study in August 2013, showed mild of shotting sleep apnea, poor sleep efficiency, prolonged latency to REM sleep, she is using CPAP machine, Has been followed up by Dr. Brett Fairy a few times, the most recent followup was in April 2015, but she forgot to bring her CPAP machine,  She also underwent PS and MSLT - MSLT had one  SREMs and an average time to fall asleep of  *.8 minutes , her Epwort was20 points,:  facit: this patient has severe hypersomnia and is at risk when driving. medication in form of nuvigil samples had been dispensed to her but she ha not yet taken it.  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   UPDATE Apr 02 2014: She has hard time keep things together,  She used non compliantly  CPAP in the past and,s he forget machine at her scheduled visit with Dr. Brett Fairy in April 2015, she fell at [facility] Sunday, fell facefoward to her right frontal area, may be transient loss of consciousness, went to [facility] ED, . No acute intracranial findings.  No facial bone fractures.  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    She continues to complain excessive daytime sleepiness, fatigue,  forgetful, still driving to clinic today, without getting loss, today's Mini-Mental Status Examination is 28 out of 30  Per patient, she had laboratory evaluation by her primary care physician in April/ may , reported normal TSH, B12, and rest of the laboratory evaluation,  The most bothersome symptoms for her are excessive daytime sleepiness, ESS is 19, FSS 27, she denies headaches, no lateralized motor or sensory deficit, no gait difficulty, still exercising daily,  Review of Systems  Out of a complete 14 system review, the patient complains of only the following symptoms, and all other reviewed systems are negative.   muscle cramps, right ear tinitus and  Right eye ptosis.no diplopia and no SOB. Headaches as pressure behind the eye , worsening with  valsalva and bending .     ALLERGIES: Allergies  Allergen Reactions  . Novocain [Procaine Hcl] Anaphylaxis  . Hydrocodone Diarrhea and Nausea And Vomiting  . Morphine And Related     Unknown.      HOME MEDICATIONS: Current Outpatient Prescriptions on File Prior to Visit  Medication Sig Dispense Refill  . acetaminophen (TYLENOL) 500 MG tablet Take 500-1,000 mg by mouth every 6 (six) hours as needed. PAIN        . amLODipine (NORVASC) 10 MG tablet Take 10 mg by mouth daily after breakfast.       . Armodafinil (NUVIGIL) 250 MG tablet Take 250 mg by mouth daily. Take in mornings      . aspirin 81 MG tablet Take 81 mg by mouth at bedtime.       Marland Kitchen BIOTIN PO Take 50 mg by mouth 2 (two) times daily.        . celecoxib (CELEBREX) 200 MG capsule Take 200 mg by mouth daily.       . Cinnamon 500 MG capsule Take 500 mg by mouth daily.        . fish oil-omega-3 fatty acids 1000 MG capsule Take 1 g by mouth daily.        Marland Kitchen gabapentin (NEURONTIN) 400 MG capsule Take 400 mg by mouth at bedtime.       Marland Kitchen losartan (COZAAR) 100 MG tablet Take 100 mg by mouth daily after breakfast.       . metFORMIN (GLUCOPHAGE) 500 MG tablet Take 500 mg by mouth 2 (two)  times daily with a meal.       . metoprolol (TOPROL-XL) 50 MG 24 hr tablet Take 50 mg by mouth daily after breakfast.       . Polyethyl Glycol-Propyl Glycol (SYSTANE ULTRA) 0.4-0.3 % SOLN Place 1 drop into both eyes daily.        . polysaccharide iron (NIFEREX) 150 MG CAPS capsule Take 150 mg by mouth daily.       . pravastatin (PRAVACHOL) 40 MG tablet Take 40 mg by mouth daily after  breakfast.       . ranitidine (ZANTAC) 300 MG capsule Take 300 mg by mouth 2 (two) times daily.        No current facility-administered medications on file prior to visit.    PAST MEDICAL HISTORY: Past Medical History  Diagnosis Date  . Hypertension   . Hyperlipidemia   . Arthritis   . Diabetes mellitus   . Chronic headache   . Heart murmur   . GERD (gastroesophageal reflux disease)   . Rectal prolapse   . Narcolepsy and cataplexy   . Migraine   . Intracranial atherosclerosis     per MRA  . High cholesterol   . OSA on CPAP     residual AHI of-1.1    PAST SURGICAL HISTORY: Past Surgical History  Procedure Laterality Date  . Total abdominal hysterectomy    . Rotator cuff repair      Left  . Kidney surgery  1998    Right.  growth removed  . Back surgery      L4,L5 discectomy  . Rectal prolapse repair  12/05/11  . Abdominal hysterectomy      FAMILY HISTORY: Family History  Problem Relation Age of Onset  . Heart disease Mother   . Throat cancer Father   . Cancer Father     stomach  . Kidney cancer Brother   . Heart disease Brother     SOCIAL HISTORY:  History   Social History  . Marital Status: Married    Spouse Name: Stephanie Barber    Number of Children: 4  . Years of Education: 14   Occupational History  . retired. prev worked at [employer].    Social History Main Topics  . Smoking status: Former Smoker    Quit date: 11/13/1980  . Smokeless tobacco: Never Used     Comment: quit in 1982  . Alcohol Use: No  . Drug Use: No  . Sexual Activity: Not on file   Other Topics  Concern  . Not on file   Social History Narrative   21 st January 2014 , patient underwent PS and MSLT - MSLT had one  SREMs and an average time to fall asleep of  *.8 minutes , her Ewort is 20 points,: facit: this patient has severe hypersomnia and is at risk when driving. medication in form ogf nuvigil smaples had been dispensed to her but she ha not yet taken it. 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 In detail -discussion of diagnosis and treatment  takes place today , first with nuvigil and if insufficient, with XYREM. Her CPAP treats her OSA very well, and she uses it 6 hours  or more each night,  residual AHI of 1.1  would not allow for OSA to be still explaining this degree of sleepiness. 2 downloads were reviewed. labs reviewed.    Patient is married Stephanie Barber) and lives at home with her husband and grandchild.   Patient has four children   Patient is retired.   Patient has a college education.   Patient is right-handed.   Patient does not drink any caffeine.           PHYSICAL EXAM   Filed Vitals:   07/28/14 1303  BP: 122/68  Pulse: 60  Resp: 16  Height: 5\' 1"  (1.549 m)  Weight: 131 lb (59.421 kg)    Not recorded    Body mass index is 24.76 kg/(m^2).   Generalized: In no acute distress, well groomed and nourished.   Neck: Supple, no carotid bruits , circumference 13 inches.   Cardiac: Regular rate rhythm, no bruit . No carotid bruit. Patient reports right ear pulsating tinnitus.   Pulmonary: Clear to auscultation bilaterally,   Musculoskeletal: No deformity.   Neurological examination  Mentation: Alert oriented to time, place, history taking, and causual conversation, today AFT 10 and  MMSE 28 points. Missed 2 on backward spelling .  Last MMSE 29/30, missed 1/3 recall, which was 3/3 today. Epworth now down to  12 while on CPAP 6 cm water.  Patient doesn't report vivid dreams, sleeps restful and doesn't need to consider the  MSLT any longer. No ned for NUVIGIL.   Cranial nerve II-XII: Pupils were equal round reactive to light.  Extraocular movements were full.  Visual field were full on confrontational test. Bilateral fundi were sharp.   Facial sensation  Intact , facial strength were normal. She reports droopy eyelids in the evening, has a right sided  visible ptosis today.  Hearing was intact to finger rubbing bilaterally. Uvula and  tongue midline. Mallompati 3   Head turning and shoulder shrug and were normal and symmetric. Tongue protrusion into cheek strength was normal.  Motor: Normal tone, bulk and strength.  Sensory: Intact to fine touch, pinprick, preserved vibratory sensation, and proprioception at toes.  Coordination: Normal finger to nose, heel-to-shin bilaterally there was no truncal ataxia  Gait: Rising up from seated position without assistance, normal stance, without trunk ataxia, moderate stride, good arm swing, smooth turning, able to perform tiptoe, and heel walking without difficulty.   Romberg signs: Negative  Deep tendon reflexes:  2/2, plantar responses were flexor bilaterally.   ASSESSMENT AND PLAN  Stephanie Barber is a 73 y.o. female complains of  excessive sleepiness, fell in early April 2015, has much improved,still reports headaches and possible sinusitis. This has exacerbated while on CPAP, she had none of the headaches for the first 6 month on CPAP.   Continued excessive daytime sleepiness but to a moderate degree ,  Epworth now 12 from 21  , while compliantly using CPAP .  she only has mild memory trouble, not measurable by MMSE.     She is to continue once  Yearly in the sleep clinic follow up for CPAP with Np or  with Dr. Brett Fairy She sees Dr. Krista Blue for non sleep related issues.  Rv in 3 month with NP , should have sinus CT before.MOCA CPAP to be brought  along yearly .  Larey Seat , M.D. Ph.D.  [facility] [address], [place], [place] [address] [phone]

## 2014-07-28 NOTE — Patient Instructions (Signed)

## 2014-08-04 ENCOUNTER — Ambulatory Visit
Admission: RE | Admit: 2014-08-04 | Discharge: 2014-08-04 | Disposition: A | Discharge status: Home / Self Care | Payer: Medicare Other | Source: Ambulatory Visit | Attending: Neurology | Admitting: Neurology

## 2014-08-04 DIAGNOSIS — Z7189 Other specified counseling: Secondary | ICD-10-CM

## 2014-08-04 DIAGNOSIS — Z9989 Dependence on other enabling machines and devices: Secondary | ICD-10-CM

## 2014-08-04 DIAGNOSIS — Z85528 Personal history of other malignant neoplasm of kidney: Secondary | ICD-10-CM | POA: Diagnosis not present

## 2014-08-04 DIAGNOSIS — J322 Chronic ethmoidal sinusitis: Secondary | ICD-10-CM

## 2014-08-04 DIAGNOSIS — G4733 Obstructive sleep apnea (adult) (pediatric): Secondary | ICD-10-CM

## 2014-08-04 DIAGNOSIS — H5710 Ocular pain, unspecified eye: Secondary | ICD-10-CM | POA: Diagnosis not present

## 2014-08-25 DIAGNOSIS — L709 Acne, unspecified: Secondary | ICD-10-CM | POA: Diagnosis not present

## 2014-08-25 DIAGNOSIS — L739 Follicular disorder, unspecified: Secondary | ICD-10-CM | POA: Diagnosis not present

## 2014-08-28 ENCOUNTER — Other Ambulatory Visit: Payer: Self-pay

## 2014-09-02 DIAGNOSIS — Z23 Encounter for immunization: Secondary | ICD-10-CM | POA: Diagnosis not present

## 2014-09-02 DIAGNOSIS — G629 Polyneuropathy, unspecified: Secondary | ICD-10-CM | POA: Diagnosis not present

## 2014-10-26 ENCOUNTER — Ambulatory Visit (INDEPENDENT_AMBULATORY_CARE_PROVIDER_SITE_OTHER): Payer: Medicare Other | Admitting: Neurology

## 2014-10-26 ENCOUNTER — Encounter: Payer: Self-pay | Admitting: Neurology

## 2014-10-26 VITALS — BP 106/61 | HR 78 | Resp 14 | Ht 59.5 in | Wt 130.0 lb

## 2014-10-26 DIAGNOSIS — Z91199 Patient's noncompliance with other medical treatment and regimen due to unspecified reason: Secondary | ICD-10-CM | POA: Insufficient documentation

## 2014-10-26 DIAGNOSIS — G4733 Obstructive sleep apnea (adult) (pediatric): Secondary | ICD-10-CM | POA: Diagnosis not present

## 2014-10-26 DIAGNOSIS — Z9989 Dependence on other enabling machines and devices: Principal | ICD-10-CM

## 2014-10-26 DIAGNOSIS — Z9114 Patient's other noncompliance with medication regimen: Secondary | ICD-10-CM | POA: Insufficient documentation

## 2014-10-26 NOTE — Progress Notes (Signed)
PATIENT: Stephanie Barber   Primary physicia, [name], MD  at [facility],  PRIMARY NEUROLOGIST ; Dr Krista Blue.  DOB: November 18, 1940  HISTORICAL   Interval history ; the patient is followed by me, Dr. Asencion Partridge Amalia [place], for sleep related issues only -she underwent a sleep study on 07-04-12 after her Epworth sleepiness score was endorsed at 21 points.  Physician was Dr. Krista Blue.   Epworth sleepiness score at 12 points,  fatigue severity at 19 points , and geriatric depression score at 2  points. She is doing much better - with repeated a Mini-Mental status exam  Was 28/30 and she scored 30/30 points last April..  As the compliance for the last 30 days was measured at 6 hours and 22 minutes daily-and  Median daily usage was  5 hours 59 minutes,  residual AHI is 2.1 pressure is 6 cm water was 1 cm EPR . the patient has a moderate air leak. The patient has not used CPAP  After May until August  As was initially prescribed but has now begun to be compliant - she reports the headaches have kept her from using it.  This was after the fall at [place] of this year and. I think that it is time for her to resume a regular use of CPAP and I would like for her to be evaluated for possible sinusitis.  I know she had a CT scan when she came to the hospital emergency room after the fall,  which did not show any sinus related issues at the time. I will order a Sinus CT and have her follow  up with Dr Krista Blue / NP.  Memory is not affected at this point. MOCA should be used for next visit.    10-26-14 Patient here again to see me for ? CPAP ? Marland Kitchen  The patient has now a 70% compliance for 63 out of 90 days on CPAP the average daily usage is 4 hours and 17 minutes the machine is set at 6 cm water was 1 cm EPR, residual AHI is 3.5. She does have high air leak data, these correlate with some of the higher nights on AHI is 2. I will encourage her to continue using CPAP she may prefer a nasal pillow over and nasal covering  mask. Her Epworth sleepiness score is endorsed at 18 points and her fatigue severity score at 31 points. There has been no recent change on medication. No recent falls. The patient reports that her memory has actually improved and she uses CPAP.  Once again, her visit should have been with Dr. Krista Blue or nurse practitioner.    Last visit Dr Krista Blue :   Stephanie Barber  is a 73 years old right-handed African American female, accompanied by her daughter, referred by her primary care physician Dr. [name] Read for evaluation of transient sharp headaches.  06/19/12 by Dr.  Marcial Pacas.  She has past medical history of migraine headaches, hypertension, hyperlipidemia, diabetes for more than 1 decade, she suffered iron deficiency anemia in 2012, hemoglobin was 9.5, now normalized with iron supplement, her A1c was 10 in 2012, most recent was 5.9 she also has diabetic peripheral neuropathy. Since May 2013, she had frequent episode of sharp transient shooting pain starting from her occipital region spreading forward to her right forehead, it is usually involving the right side, occasionally on the left side, lasting a few seconds, no loss of consciousness, no lingering pain, EEG normal.  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 She had sleep study in August 2013, showed mild of shotting sleep apnea, poor sleep efficiency, prolonged latency to REM sleep, she is using CPAP machine, Has been followed up by Dr. Brett Fairy a few times, the most recent followup was in April 2015, but she forgot to bring her CPAP machine,  She also underwent PS and MSLT - MSLT had one  SREMs and an average time to fall asleep of  *.8 minutes , her Epwort was20 points,:  facit: this patient has severe hypersomnia and is at risk  when driving. medication in form of nuvigil samples had been dispensed to her but she ha not yet taken it.  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   UPDATE Apr 02 2014: She has hard time keep things together,  She used non compliantly  CPAP in the past and,s he forget machine at her scheduled visit with Dr. Brett Fairy in April 2015, she fell at [facility] Sunday, fell facefoward to her right frontal area, may be transient loss of consciousness, went to [facility] ED, . No acute intracranial findings.  No facial bone fractures.  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    She continues to complain excessive daytime sleepiness, fatigue, forgetful, still driving to clinic today, without getting loss, today's Mini-Mental Status Examination is 28 out of 30  Per patient, she had laboratory evaluation by her primary care physician in April/ may , reported normal TSH, B12, and rest of the laboratory evaluation,  The most bothersome symptoms for her are excessive daytime sleepiness, ESS is 19, FSS 27, she denies headaches, no lateralized motor or sensory deficit, no gait difficulty, still exercising daily,  Review of Systems  Out of a complete 14 system review, the patient complains of only the following symptoms, and all other reviewed systems are negative.   muscle cramps, right ear tinitus and  Right eye ptosis.no diplopia and no SOB. Headaches as pressure behind the eye , worsening with  valsalva and bending .     ALLERGIES: Allergies  Allergen Reactions  . Novocain [Procaine Hcl] Anaphylaxis  . Hydrocodone Diarrhea and Nausea And Vomiting  . Morphine And Related     Unknown.      HOME MEDICATIONS: Current Outpatient Prescriptions on File Prior to Visit  Medication Sig Dispense Refill  . acetaminophen (TYLENOL)  500 MG tablet Take 500-1,000 mg by mouth every 6 (six) hours as needed. PAIN      . amLODipine (NORVASC) 10 MG tablet Take 10 mg by mouth daily after breakfast.     . Armodafinil (NUVIGIL) 250 MG tablet Take 250 mg by mouth daily. Take in mornings    . aspirin 81 MG tablet Take 81 mg by mouth at bedtime.     Marland Kitchen BIOTIN PO Take 50 mg by mouth 2 (two) times daily.      . celecoxib (CELEBREX) 200 MG capsule Take 200 mg by mouth daily.     . Cinnamon 500 MG capsule Take 500 mg by mouth daily.      . fish oil-omega-3 fatty acids 1000 MG capsule Take 1 g by  mouth daily.      Marland Kitchen gabapentin (NEURONTIN) 400 MG capsule Take 400 mg by mouth at bedtime.     Marland Kitchen losartan (COZAAR) 100 MG tablet Take 100 mg by mouth daily after breakfast.     . metFORMIN (GLUCOPHAGE) 500 MG tablet Take 500 mg by mouth 2 (two) times daily with a meal.     . metoprolol (TOPROL-XL) 50 MG 24 hr tablet Take 50 mg by mouth daily after breakfast.     . Polyethyl Glycol-Propyl Glycol (SYSTANE ULTRA) 0.4-0.3 % SOLN Place 1 drop into both eyes daily.      . polysaccharide iron (NIFEREX) 150 MG CAPS capsule Take 150 mg by mouth daily.     . pravastatin (PRAVACHOL) 40 MG tablet Take 40 mg by mouth daily after breakfast.     . ranitidine (ZANTAC) 300 MG capsule Take 300 mg by mouth 2 (two) times daily.      No current facility-administered medications on file prior to visit.    PAST MEDICAL HISTORY: Past Medical History  Diagnosis Date  . Hypertension   . Hyperlipidemia   . Arthritis   . Diabetes mellitus   . Chronic headache   . Heart murmur   . GERD (gastroesophageal reflux disease)   . Rectal prolapse   . Narcolepsy and cataplexy   . Migraine   . Intracranial atherosclerosis     per MRA  . High cholesterol   . OSA on CPAP     residual AHI of-1.1    PAST SURGICAL HISTORY: Past Surgical History  Procedure Laterality Date  . Total abdominal hysterectomy    . Rotator cuff repair      Left  . Kidney surgery  1998     Right.  growth removed  . Back surgery      L4,L5 discectomy  . Rectal prolapse repair  12/05/11  . Abdominal hysterectomy      FAMILY HISTORY: Family History  Problem Relation Age of Onset  . Heart disease Mother   . Throat cancer Father   . Cancer Father     stomach  . Kidney cancer Brother   . Heart disease Brother     SOCIAL HISTORY:  History   Social History  . Marital Status: Married    Spouse Name: [name]    Number of Children: 4  . Years of Education: 14   Occupational History  . retired. prev worked at [employer].    Social History Main Topics  . Smoking status: Former Smoker    Quit date: 11/13/1980  . Smokeless tobacco: Never Used     Comment: quit in 1982  . Alcohol Use: No  . Drug Use: No  . Sexual Activity: Not on file   Other Topics Concern  . Not on file   Social History Narrative   21 st January 2014 , patient underwent PS and MSLT - MSLT had one  SREMs and an average time to fall asleep of  *.8 minutes , her Ewort is 20 points,: facit: this patient has severe hypersomnia and is at risk when driving. medication in form ogf nuvigil smaples had been dispensed to her but she ha not yet taken it. 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 In detail -discussion of diagnosis and treatment  takes place today , first  with nuvigil and if insufficient, with XYREM. Her CPAP treats her OSA very well, and she uses it 6 hours  or more each night,  residual AHI of 1.1  would not allow for OSA to be still explaining this degree of sleepiness. 2 downloads were reviewed. labs reviewed.    Patient is married [name]) and lives at home with her husband and grandchild.   Patient has four children   Patient is retired.   Patient has a college education.   Patient is right-handed.   Patient does not drink any  caffeine.           PHYSICAL EXAM   Filed Vitals:   10/26/14 1504  BP: 106/61  Pulse: 78  Resp: 14  Height: 4' 11.5" (1.511 m)  Weight: 130 lb (58.968 kg)    Not recorded      Body mass index is 25.83 kg/(m^2).   Generalized: In no acute distress, well groomed and nourished.   Neck: Supple, no carotid bruits , circumference 13 inches.   Cardiac: Regular rate rhythm, no bruit . No carotid bruit. Patient reports right ear pulsating tinnitus.   Pulmonary: Clear to auscultation bilaterally,   Musculoskeletal: No deformity.   Neurological examination  Mentation: Alert oriented to time, place, history taking, and causual conversation, today AFT 10 and  MMSE 28 points. Missed 2 on backward spelling .  Last MMSE 29/30, missed 1/3 recall, which was 3/3 today. Epworth now down to 12 while on CPAP 6 cm water.  Patient doesn't report vivid dreams, sleeps restful and doesn't need to consider the  MSLT any longer. No ned for NUVIGIL.   Cranial nerve II-XII: Pupils were equal round reactive to light.  Extraocular movements were full.  Visual field were full on confrontational test. Bilateral fundi were sharp.   Facial sensation  Intact , facial strength were normal. She reports droopy eyelids in the evening, has a right sided  visible ptosis today.  Hearing was intact to finger rubbing bilaterally. Uvula and  tongue midline. Mallompati 3   Head turning and shoulder shrug and were normal and symmetric. Tongue protrusion into cheek strength was normal.  Motor: Normal tone, bulk and strength.  Sensory: Intact to fine touch, pinprick, preserved vibratory sensation, and proprioception at toes.  Coordination: Normal finger to nose, heel-to-shin bilaterally there was no truncal ataxia  Gait: Rising up from seated position without assistance, normal stance, without trunk ataxia, moderate stride, good arm swing, smooth turning, able to perform tiptoe, and heel walking without difficulty.     Romberg signs: Negative  Deep tendon reflexes:  2/2, plantar responses were flexor bilaterally.   ASSESSMENT AND PLAN  JEFFIE WIDDOWSON is a 73 y.o. female complains of  excessive sleepiness, possible sinusitis. This has exacerbated while on CPAP, she had non more  of the headaches for the first 6 month on CPAP.   Continued excessive daytime sleepiness but to a moderate degree , Patient is now compliant with CPAP.    She is to continue once Yearly in the sleep clinic follow up for CPAP with Np  She sees Dr. Krista Blue for non sleep related issues.   Rv in 12 month with CPAP to be brought along yearly .  Nurse practitioner to see patient.  Larey Seat , M.D.   [facility] [address], [place], [place] [address] [phone]

## 2014-11-04 ENCOUNTER — Ambulatory Visit: Payer: Medicare Other | Admitting: Nurse Practitioner

## 2014-12-03 DIAGNOSIS — G5600 Carpal tunnel syndrome, unspecified upper limb: Secondary | ICD-10-CM | POA: Diagnosis not present

## 2014-12-03 DIAGNOSIS — I10 Essential (primary) hypertension: Secondary | ICD-10-CM | POA: Diagnosis not present

## 2014-12-03 DIAGNOSIS — M545 Low back pain: Secondary | ICD-10-CM | POA: Diagnosis not present

## 2014-12-03 DIAGNOSIS — E780 Pure hypercholesterolemia: Secondary | ICD-10-CM | POA: Diagnosis not present

## 2014-12-03 DIAGNOSIS — K219 Gastro-esophageal reflux disease without esophagitis: Secondary | ICD-10-CM | POA: Diagnosis not present

## 2014-12-03 DIAGNOSIS — D509 Iron deficiency anemia, unspecified: Secondary | ICD-10-CM | POA: Diagnosis not present

## 2014-12-03 DIAGNOSIS — E119 Type 2 diabetes mellitus without complications: Secondary | ICD-10-CM | POA: Diagnosis not present

## 2015-02-15 ENCOUNTER — Other Ambulatory Visit (HOSPITAL_COMMUNITY): Payer: Self-pay | Admitting: Family Medicine

## 2015-02-15 DIAGNOSIS — R253 Fasciculation: Secondary | ICD-10-CM | POA: Diagnosis not present

## 2015-02-15 DIAGNOSIS — M25512 Pain in left shoulder: Secondary | ICD-10-CM | POA: Diagnosis not present

## 2015-02-15 DIAGNOSIS — Z1231 Encounter for screening mammogram for malignant neoplasm of breast: Secondary | ICD-10-CM

## 2015-02-25 ENCOUNTER — Ambulatory Visit (HOSPITAL_COMMUNITY)
Admission: RE | Admit: 2015-02-25 | Discharge: 2015-02-25 | Disposition: A | Discharge status: Home / Self Care | Payer: Medicare Other | Source: Ambulatory Visit | Attending: Family Medicine | Admitting: Family Medicine

## 2015-02-25 DIAGNOSIS — Z1231 Encounter for screening mammogram for malignant neoplasm of breast: Secondary | ICD-10-CM | POA: Diagnosis not present

## 2015-02-26 DIAGNOSIS — M25512 Pain in left shoulder: Secondary | ICD-10-CM | POA: Diagnosis not present

## 2015-03-12 ENCOUNTER — Other Ambulatory Visit: Payer: Self-pay | Admitting: Orthopedic Surgery

## 2015-03-12 DIAGNOSIS — M25512 Pain in left shoulder: Secondary | ICD-10-CM

## 2015-03-29 ENCOUNTER — Telehealth: Payer: Self-pay | Admitting: Neurology

## 2015-03-29 ENCOUNTER — Encounter: Payer: Self-pay | Admitting: Neurology

## 2015-03-29 ENCOUNTER — Ambulatory Visit (INDEPENDENT_AMBULATORY_CARE_PROVIDER_SITE_OTHER): Payer: Medicare Other | Admitting: Neurology

## 2015-03-29 VITALS — BP 131/65 | HR 60 | Ht 59.5 in | Wt 131.0 lb

## 2015-03-29 DIAGNOSIS — Z9119 Patient's noncompliance with other medical treatment and regimen: Secondary | ICD-10-CM

## 2015-03-29 DIAGNOSIS — G43009 Migraine without aura, not intractable, without status migrainosus: Secondary | ICD-10-CM | POA: Diagnosis not present

## 2015-03-29 DIAGNOSIS — G4710 Hypersomnia, unspecified: Secondary | ICD-10-CM | POA: Diagnosis not present

## 2015-03-29 DIAGNOSIS — G4733 Obstructive sleep apnea (adult) (pediatric): Secondary | ICD-10-CM

## 2015-03-29 DIAGNOSIS — Z91199 Patient's noncompliance with other medical treatment and regimen due to unspecified reason: Secondary | ICD-10-CM

## 2015-03-29 DIAGNOSIS — Z9989 Dependence on other enabling machines and devices: Principal | ICD-10-CM

## 2015-03-29 NOTE — Telephone Encounter (Signed)
Patient to use CPAP machine, could not tolerate the bigger facial mass, she has left shoulder pain, pending left shoulder evaluation and potential surgery,  Hope to get a nose fit for her machine,

## 2015-03-29 NOTE — Telephone Encounter (Signed)
Patient with documented non compliance requests fiting for a nasal mask. Please forward this to DME>

## 2015-03-29 NOTE — Progress Notes (Signed)
Chief Complaint  Patient presents with  . Hypersomnolent    She has been experiencing increased daytime somnolence.  She has been unable to use CPAP due to left shoulder pain.  She is under the care of Dr. Alphonzo Severance for her shoulder.  . Memory Loss    MMSE 28/30 - 8 animals. She is having more problems with her short term memory.      PATIENT: Stephanie Barber DOB: 08-04-1941  HISTORICAL  Stephanie Barber  is a 74 years old right-handed African American female, accompanied by her daughter, referred by her primary care physician Dr. [name] Read for evaluation of transient sharp headaches.    Initial visit 06/19/12   She has past medical history of migraine headaches, hypertension, hyperlipidemia, diabetes for more than 1 decade, she suffered iron deficiency anemia in 2012, hemoglobin was 9.5, now normalized with iron supplement, her A1c was 10 in 2012, most recent was 5.9 she also has diabetic peripheral neuropathy.  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  She had sleep study in August 2013, showed mild obstructive sleep apnea, poor sleep efficiency, prolonged latency to REM sleep, she is using CPAP machine, Has been followed up by Dr. Brett Fairy a few times, most recent visit was Dec 2015  She also underwent PS and MSLT - MSLT had one  SREMs and an average time to fall asleep of  8 minutes , her Epworth is 20 points this patient has severe hypersomnia and is at risk when driving. She  is now taking nuvigil 250mg  qam.  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    UPDATE May 16th 2016: She now complains of left shoulder pain, going through orthopedic evaluation, she uses heating pad for her left shoulder every night, she could not use CPAP machine, because of bulky equipment, she will woke up manytimes doing night because of left shoulder pain, very tired during day times, her headaches is under good control. Her husband drove her to clinic today,   Review of Systems  Out of a complete 14 system review, the patient complains of only the following symptoms, and all other reviewed systems are negative.  Joint pain, neck stiffness  ALLERGIES: Allergies  Allergen Reactions  . Novocain [Procaine Hcl] Anaphylaxis  . Hydrocodone Diarrhea and Nausea And Vomiting  . Morphine And Related     Unknown.      HOME MEDICATIONS: Current Outpatient Prescriptions on File Prior to Visit  Medication Sig Dispense Refill  . acetaminophen (TYLENOL) 500 MG tablet Take 500-1,000 mg by mouth every 6 (six) hours as needed. PAIN      . amLODipine (NORVASC) 10 MG tablet Take 10 mg by mouth daily after breakfast.     . Armodafinil (NUVIGIL) 250 MG tablet Take 250 mg by mouth daily. Take in mornings    . aspirin 81 MG tablet Take 81 mg by  mouth at bedtime.     Marland Kitchen BIOTIN PO Take 50 mg by mouth 2 (two) times daily.      . celecoxib (CELEBREX) 200 MG capsule Take 200 mg by mouth daily.     . Cinnamon 500 MG capsule Take 500 mg by mouth daily.      . fish oil-omega-3 fatty acids 1000 MG capsule Take 1 g by mouth daily.      Marland Kitchen gabapentin (NEURONTIN) 400 MG capsule Take 400 mg by mouth at bedtime.     Marland Kitchen losartan (COZAAR) 100 MG tablet Take 100 mg by mouth daily after breakfast.     . metFORMIN (GLUCOPHAGE) 500 MG tablet Take 500 mg by mouth 2 (two) times  daily with a meal.     . metoprolol (TOPROL-XL) 50 MG 24 hr tablet Take 50 mg by mouth daily after breakfast.     . Polyethyl Glycol-Propyl Glycol (SYSTANE ULTRA) 0.4-0.3 % SOLN Place 1 drop into both eyes daily.      . polysaccharide iron (NIFEREX) 150 MG CAPS capsule Take 150 mg by mouth daily.     . pravastatin (PRAVACHOL) 40 MG tablet Take 40 mg by mouth daily after breakfast.     . ranitidine (ZANTAC) 300 MG capsule Take 300 mg by mouth 2 (two) times daily.      No current facility-administered medications on file prior to visit.    PAST MEDICAL HISTORY: Past Medical History  Diagnosis Date  . Hypertension   . Hyperlipidemia   . Arthritis   . Diabetes mellitus   . Chronic headache   . Heart murmur   . GERD (gastroesophageal reflux disease)   . Rectal prolapse   . Narcolepsy and cataplexy   . Migraine   . Intracranial atherosclerosis     per MRA  . High cholesterol   . OSA on CPAP     residual AHI of-1.1    PAST SURGICAL HISTORY: Past Surgical History  Procedure Laterality Date  . Total abdominal hysterectomy    . Rotator cuff repair      Left  . Kidney surgery  1998    Right.  growth removed  . Back surgery      L4,L5 discectomy  . Rectal prolapse repair  12/05/11  . Abdominal hysterectomy      FAMILY HISTORY: Family History  Problem Relation Age of Onset  . Heart disease Mother   . Throat cancer Father   . Cancer Father     stomach  . Kidney cancer Brother   . Heart disease Brother     SOCIAL HISTORY:  History   Social History  . Marital Status: Married    Spouse Name: [name]  . Number of Children: 4  . Years of Education: 14   Occupational History  . retired. prev worked at [employer].    Social History Main Topics  . Smoking status: Former Smoker    Quit date: 11/13/1980  . Smokeless tobacco: Never Used     Comment: quit in 1982  . Alcohol Use: No  . Drug Use: No  . Sexual Activity: Not on file   Other Topics Concern  . Not  on file   Social History Narrative   21 st January 2014 , patient underwent PS and MSLT - MSLT had one  SREMs and an average time to fall asleep of  *.8 minutes , her Ewort is 20 points,: facit: this patient has severe hypersomnia and is at risk when  driving. medication in form ogf nuvigil smaples had been dispensed to her but she ha not yet taken it. 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 In detail -discussion of diagnosis and treatment  takes place today , first with nuvigil and if insufficient, with XYREM. Her CPAP treats her OSA very well, and she uses it 6 hours  or more each night,  residual AHI of 1.1  would not allow for OSA to be still explaining this degree of sleepiness. 2 downloads were reviewed. labs reviewed.    Patient is married [name]) and lives at home with her husband and grandchild.   Patient has four children   Patient is retired.   Patient has a college education.   Patient is right-handed.   Patient does not drink any caffeine.           PHYSICAL EXAM   Filed Vitals:   03/29/15 1054  BP: 131/65  Pulse: 60  Height: 4' 11.5" (1.511 m)  Weight: 131 lb (59.421 kg)    Not recorded      Body mass index is 26.03 kg/(m^2).   PHYSICAL EXAMNIATION:  Gen: NAD, conversant, well nourised, obese, well groomed                     Cardiovascular: Regular rate rhythm, no peripheral edema, warm, nontender. Eyes: Conjunctivae clear without exudates or hemorrhage Neck: Supple, no carotid bruise. Pulmonary: Clear to auscultation bilaterally   NEUROLOGICAL EXAM:  MENTAL STATUS: Speech:    Speech is normal; fluent and spontaneous with normal comprehension.  Cognition:    The patient is oriented to person, place, and time;     recent and remote memory intact;     language fluent;     normal attention, concentration,      fund of knowledge.  CRANIAL NERVES: CN II: Visual fields are full to confrontation. Fundoscopic exam is normal with sharp discs and no vascular changes. Venous pulsations are present bilaterally. Pupils are 4 mm and briskly reactive to light. Visual acuity is 20/20 bilaterally. CN III, IV, VI: extraocular movement are normal. No ptosis. CN V: Facial sensation is intact to pinprick in all 3 divisions bilaterally. Corneal responses are intact.  CN VII: Face is symmetric with normal eye closure and smile. CN VIII: Hearing is normal to rubbing fingers CN IX, X: Palate elevates symmetrically. Phonation is normal. CN XI: Head turning and shoulder shrug are intact CN XII: Tongue is midline with normal movements and no atrophy.  MOTOR: There is no pronator drift of out-stretched arms. Muscle bulk and tone are normal. Muscle strength is normal.  REFLEXES: Reflexes are 2+ and symmetric at the biceps, triceps, knees, and ankles. Plantar responses are flexor.  SENSORY: Light touch, pinprick, position sense, and vibration sense are intact in fingers and toes.  COORDINATION: Rapid alternating movements and fine finger movements are intact. There is no dysmetria on finger-to-nose and heel-knee-shin. There are no abnormal or extraneous movements.   GAIT/STANCE: Posture is normal. Gait is steady with normal steps, base, arm swing, and turning. Heel and toe walking are normal. Tandem gait is normal.  Romberg is absent.  DIAGNOSTIC DATA (LABS, IMAGING, TESTING) - I reviewed patient records, labs, notes, testing and imaging myself where available.  Lab Results  Component Value Date   WBC 7.9  12/07/2011   HGB 10.2* 12/08/2011   HCT 27.3* 12/07/2011   MCV 79.8 12/07/2011   PLT 164 12/07/2011      Component Value Date/Time   NA 135 12/06/2011 0426   K 4.0 12/06/2011 0426   CL 104 12/06/2011 0426   CO2 24 12/06/2011 0426   GLUCOSE 134* 12/06/2011 0426   BUN 10 12/06/2011 0426   CREATININE  0.71 12/06/2011 0426   CALCIUM 8.3* 12/06/2011 0426   PROT 7.3 05/20/2009 1400   ALBUMIN 3.9 05/20/2009 1400   AST 27 05/20/2009 1400   ALT 23 05/20/2009 1400   ALKPHOS 65 05/20/2009 1400   BILITOT 0.4 05/20/2009 1400   GFRNONAA 85* 12/06/2011 0426   GFRAA >90 12/06/2011 0426    ASSESSMENT AND PLAN  Stephanie Barber is a 73 y.o. female   1. excessive sleepiness, probable narcolepsy, is under the treatment of Dr. Brett Fairy, CPAP machine, want a  prescription of nasal piece 2. Migraine headache, is under good control, 3, left shoulder pain, under through orthopedic evaluation 4 return to clinic with Dr. Reed Breech practictioner in 6 months   Marcial Pacas, M.D. Ph.D.  [facility] [address] [place], [place] [address] [phone]

## 2015-03-30 NOTE — Telephone Encounter (Signed)
Order was sent to [place] from [place], [place], [place].

## 2015-04-05 ENCOUNTER — Ambulatory Visit
Admission: RE | Admit: 2015-04-05 | Discharge: 2015-04-05 | Disposition: A | Discharge status: Home / Self Care | Payer: Medicare Other | Source: Ambulatory Visit | Attending: Orthopedic Surgery | Admitting: Orthopedic Surgery

## 2015-04-05 DIAGNOSIS — M75102 Unspecified rotator cuff tear or rupture of left shoulder, not specified as traumatic: Secondary | ICD-10-CM | POA: Diagnosis not present

## 2015-04-05 DIAGNOSIS — M25512 Pain in left shoulder: Secondary | ICD-10-CM

## 2015-04-05 MED ORDER — IOHEXOL 180 MG/ML  SOLN
15.0000 mL | Freq: Once | INTRAMUSCULAR | Status: AC | PRN
  Administered 2015-04-05: 15 mL via INTRA_ARTICULAR

## 2015-04-14 DIAGNOSIS — M7522 Bicipital tendinitis, left shoulder: Secondary | ICD-10-CM | POA: Diagnosis not present

## 2015-04-14 DIAGNOSIS — M75122 Complete rotator cuff tear or rupture of left shoulder, not specified as traumatic: Secondary | ICD-10-CM | POA: Diagnosis not present

## 2015-04-30 ENCOUNTER — Other Ambulatory Visit (HOSPITAL_COMMUNITY): Payer: Self-pay | Admitting: Orthopedic Surgery

## 2015-05-10 ENCOUNTER — Other Ambulatory Visit: Payer: Self-pay

## 2015-05-13 ENCOUNTER — Encounter (HOSPITAL_COMMUNITY): Payer: Self-pay

## 2015-05-13 ENCOUNTER — Encounter (HOSPITAL_COMMUNITY)
Admission: RE | Admit: 2015-05-13 | Discharge: 2015-05-13 | Disposition: A | Discharge status: Home / Self Care | Payer: Medicare Other | Source: Ambulatory Visit | Attending: Orthopedic Surgery | Admitting: Orthopedic Surgery

## 2015-05-13 DIAGNOSIS — Z79899 Other long term (current) drug therapy: Secondary | ICD-10-CM | POA: Diagnosis not present

## 2015-05-13 DIAGNOSIS — M75102 Unspecified rotator cuff tear or rupture of left shoulder, not specified as traumatic: Secondary | ICD-10-CM | POA: Insufficient documentation

## 2015-05-13 DIAGNOSIS — Z01812 Encounter for preprocedural laboratory examination: Secondary | ICD-10-CM | POA: Insufficient documentation

## 2015-05-13 DIAGNOSIS — M7552 Bursitis of left shoulder: Secondary | ICD-10-CM | POA: Insufficient documentation

## 2015-05-13 DIAGNOSIS — M7522 Bicipital tendinitis, left shoulder: Secondary | ICD-10-CM | POA: Insufficient documentation

## 2015-05-13 HISTORY — DX: Adverse effect of unspecified anesthetic, initial encounter: T41.45XA

## 2015-05-13 HISTORY — DX: Other complications of anesthesia, initial encounter: T88.59XA

## 2015-05-13 LAB — BASIC METABOLIC PANEL
Anion gap: 9 (ref 5–15)
BUN: 10 mg/dL (ref 6–20)
CHLORIDE: 110 mmol/L (ref 101–111)
CO2: 23 mmol/L (ref 22–32)
CREATININE: 0.81 mg/dL (ref 0.44–1.00)
Calcium: 10.2 mg/dL (ref 8.9–10.3)
GFR calc non Af Amer: >60 mL/min (ref >60)
GLUCOSE: 72 mg/dL (ref 65–99)
POTASSIUM: 4.7 mmol/L (ref 3.5–5.1)
Sodium: 142 mmol/L (ref 135–145)

## 2015-05-13 LAB — CBC
HCT: 38.4 % (ref 36.0–46.0)
Hemoglobin: 13.1 g/dL (ref 12.0–15.0)
MCH: 28.9 pg (ref 26.0–34.0)
MCHC: 34.1 g/dL (ref 30.0–36.0)
MCV: 84.8 fL (ref 78.0–100.0)
PLATELETS: 229 10*3/uL (ref 150–400)
RBC: 4.53 MIL/uL (ref 3.87–5.11)
RDW: 12.9 % (ref 11.5–15.5)
WBC: 5 10*3/uL (ref 4.0–10.5)

## 2015-05-13 LAB — GLUCOSE, CAPILLARY: Glucose-Capillary: 92 mg/dL (ref 65–99)

## 2015-05-13 NOTE — Pre-Procedure Instructions (Signed)
Stephanie Barber  05/13/2015      [facility] [place], [place] [address] Phone: [phone] Fax: [phone]   Your procedure is scheduled on 05/25/2015  Report to [facility] Admitting at 9:15 A.M.  Call this number if you have problems the morning of surgery:  [phone]   Remember:  Do not eat food or drink liquids after midnight.  On Monday  Take these medicines the morning of surgery with A SIP OF WATER ---- AMLODIPINE, METOPROLOL, ZANTAC   Do not wear jewelry, make-up or nail polish.  Do not wear lotions, powders, or perfumes.  You may wear deodorant.  Do not shave 48 hours prior to surgery.   Do not bring valuables to the hospital.  [facility] is not responsible for any belongings or valuables.  Contacts, dentures or bridgework may not be worn into surgery.  Leave your suitcase in the car.  After surgery it may be brought to your room.  For patients admitted to the hospital, discharge time will be determined by your treatment team.  Patients discharged the day of surgery will not be allowed to drive home.   Name and phone number of your driver:   /w spouse  Special instructions:  Special Instructions: [place] - Preparing for Surgery  Before surgery, you can play an important role.  Because skin is not sterile, your skin needs to be as free of germs as possible.  You can reduce the number of germs on you skin by washing with CHG (chlorahexidine gluconate) soap before surgery.  CHG is an antiseptic cleaner which kills germs and bonds with the skin to continue killing germs even after washing.  Please DO NOT use if you have an allergy to CHG or antibacterial soaps.  If your skin becomes reddened/irritated stop using the CHG and inform your nurse when you arrive at Short Stay.  Do not shave (including legs and underarms) for at least 48 hours prior to the first CHG shower.  You may shave  your face.  Please follow these instructions carefully:   1.  Shower with CHG Soap the night before surgery and the  morning of Surgery.  2.  If you choose to wash your hair, wash your hair first as usual with your  normal shampoo.  3.  After you shampoo, rinse your hair and body thoroughly to remove the  Shampoo.  4.  Use CHG as you would any other liquid soap.  You can apply chg directly to the skin and wash gently with scrungie or a clean washcloth.  5.  Apply the CHG Soap to your body ONLY FROM THE NECK DOWN.    Do not use on open wounds or open sores.  Avoid contact with your eyes, ears, mouth and genitals (private parts).  Wash genitals (private parts)   with your normal soap.  6.  Wash thoroughly, paying special attention to the area where your surgery will be performed.  7.  Thoroughly rinse your body with warm water from the neck down.  8.  DO NOT shower/wash with your normal soap after using and rinsing off   the CHG Soap.  9.  Pat yourself dry with a clean towel.            10.  Wear clean pajamas.            11.  Place clean sheets on  your bed the night of your first shower and do not sleep with pets.  Day of Surgery  Do not apply any lotions/deodorants the morning of surgery.  Please wear clean clothes to the hospital/surgery center.  Please read over the following fact sheets that you were given. Pain Booklet, Coughing and Deep Breathing and Surgical Site Infection Prevention

## 2015-05-13 NOTE — Progress Notes (Signed)
Pt. Reports that she saw MD ( PCP ) 2-3 months ago & had an ekg, requested via fax from Dr. Alyson Ingles

## 2015-05-13 NOTE — Progress Notes (Signed)
Call to pharmacy tech. For medication reconciliation

## 2015-05-14 LAB — HEMOGLOBIN A1C
Hgb A1c MFr Bld: 6.2 % — ABNORMAL HIGH (ref 4.8–5.6)
Mean Plasma Glucose: 131 mg/dL

## 2015-05-24 MED ORDER — CEFAZOLIN SODIUM-DEXTROSE 2-3 GM-% IV SOLR
2.0000 g | INTRAVENOUS | Status: AC
  Administered 2015-05-25: 2 g via INTRAVENOUS
  Filled 2015-05-24: qty 50

## 2015-05-25 ENCOUNTER — Encounter (HOSPITAL_COMMUNITY): Payer: Self-pay | Admitting: *Deleted

## 2015-05-25 ENCOUNTER — Ambulatory Visit (HOSPITAL_COMMUNITY): Payer: Medicare Other | Admitting: Emergency Medicine

## 2015-05-25 ENCOUNTER — Encounter (HOSPITAL_COMMUNITY)
Admission: RE | Disposition: A | Discharge status: Home / Self Care | Payer: Self-pay | Source: Ambulatory Visit | Attending: Orthopedic Surgery

## 2015-05-25 ENCOUNTER — Ambulatory Visit (HOSPITAL_COMMUNITY)
Admission: RE | Admit: 2015-05-25 | Discharge: 2015-05-25 | Disposition: A | Discharge status: Home / Self Care | Payer: Medicare Other | Source: Ambulatory Visit | Attending: Orthopedic Surgery | Admitting: Orthopedic Surgery

## 2015-05-25 ENCOUNTER — Ambulatory Visit (HOSPITAL_COMMUNITY): Payer: Medicare Other | Admitting: Certified Registered"

## 2015-05-25 DIAGNOSIS — E119 Type 2 diabetes mellitus without complications: Secondary | ICD-10-CM | POA: Diagnosis not present

## 2015-05-25 DIAGNOSIS — M75102 Unspecified rotator cuff tear or rupture of left shoulder, not specified as traumatic: Secondary | ICD-10-CM | POA: Diagnosis not present

## 2015-05-25 DIAGNOSIS — I10 Essential (primary) hypertension: Secondary | ICD-10-CM | POA: Diagnosis not present

## 2015-05-25 DIAGNOSIS — M7522 Bicipital tendinitis, left shoulder: Secondary | ICD-10-CM | POA: Diagnosis not present

## 2015-05-25 DIAGNOSIS — M7552 Bursitis of left shoulder: Secondary | ICD-10-CM | POA: Insufficient documentation

## 2015-05-25 DIAGNOSIS — E785 Hyperlipidemia, unspecified: Secondary | ICD-10-CM | POA: Diagnosis not present

## 2015-05-25 DIAGNOSIS — M75122 Complete rotator cuff tear or rupture of left shoulder, not specified as traumatic: Secondary | ICD-10-CM | POA: Diagnosis not present

## 2015-05-25 DIAGNOSIS — G4733 Obstructive sleep apnea (adult) (pediatric): Secondary | ICD-10-CM | POA: Diagnosis not present

## 2015-05-25 DIAGNOSIS — Z79899 Other long term (current) drug therapy: Secondary | ICD-10-CM | POA: Insufficient documentation

## 2015-05-25 DIAGNOSIS — G8918 Other acute postprocedural pain: Secondary | ICD-10-CM | POA: Diagnosis not present

## 2015-05-25 DIAGNOSIS — Z7982 Long term (current) use of aspirin: Secondary | ICD-10-CM | POA: Insufficient documentation

## 2015-05-25 HISTORY — PX: SHOULDER ARTHROSCOPY WITH SUBACROMIAL DECOMPRESSION, ROTATOR CUFF REPAIR AND BICEP TENDON REPAIR: SHX5687

## 2015-05-25 LAB — GLUCOSE, CAPILLARY
Glucose-Capillary: 92 mg/dL (ref 65–99)
Glucose-Capillary: 107 mg/dL — ABNORMAL HIGH (ref 65–99)

## 2015-05-25 SURGERY — SHOULDER ARTHROSCOPY WITH SUBACROMIAL DECOMPRESSION, ROTATOR CUFF REPAIR AND BICEP TENDON REPAIR
Anesthesia: Regional | Site: Shoulder | Laterality: Left

## 2015-05-25 MED ORDER — SODIUM CHLORIDE 0.9 % IR SOLN
Status: DC | PRN
  Administered 2015-05-25 (×2): 3000 mL

## 2015-05-25 MED ORDER — ROCURONIUM BROMIDE 50 MG/5ML IV SOLN
INTRAVENOUS | Status: AC
  Filled 2015-05-25: qty 1

## 2015-05-25 MED ORDER — DEXAMETHASONE SODIUM PHOSPHATE 4 MG/ML IJ SOLN
INTRAMUSCULAR | Status: DC | PRN
  Administered 2015-05-25: 4 mg via INTRAVENOUS

## 2015-05-25 MED ORDER — PROMETHAZINE HCL 25 MG/ML IJ SOLN
6.2500 mg | INTRAMUSCULAR | Status: DC | PRN
Start: 2015-05-25 — End: 2015-05-25

## 2015-05-25 MED ORDER — SUCCINYLCHOLINE CHLORIDE 20 MG/ML IJ SOLN
INTRAMUSCULAR | Status: AC
  Filled 2015-05-25: qty 1

## 2015-05-25 MED ORDER — LIDOCAINE HCL (CARDIAC) 20 MG/ML IV SOLN
INTRAVENOUS | Status: AC
  Filled 2015-05-25: qty 5

## 2015-05-25 MED ORDER — PROPOFOL 10 MG/ML IV BOLUS
INTRAVENOUS | Status: AC
  Filled 2015-05-25: qty 20

## 2015-05-25 MED ORDER — HYDROMORPHONE HCL 1 MG/ML IJ SOLN: 0.2500 mg | INTRAMUSCULAR | Status: DC | PRN

## 2015-05-25 MED ORDER — BUPIVACAINE HCL (PF) 0.25 % IJ SOLN
INTRAMUSCULAR | Status: AC
  Filled 2015-05-25: qty 30

## 2015-05-25 MED ORDER — SUFENTANIL CITRATE 50 MCG/ML IV SOLN
INTRAVENOUS | Status: AC
  Filled 2015-05-25: qty 1

## 2015-05-25 MED ORDER — ONDANSETRON HCL 4 MG/2ML IJ SOLN
INTRAMUSCULAR | Status: AC
  Filled 2015-05-25: qty 2

## 2015-05-25 MED ORDER — SUFENTANIL CITRATE 50 MCG/ML IV SOLN
INTRAVENOUS | Status: DC | PRN
Start: 2015-05-25 — End: 2015-05-25
  Administered 2015-05-25: 10 ug via INTRAVENOUS

## 2015-05-25 MED ORDER — LACTATED RINGERS IV SOLN
INTRAVENOUS | Status: DC
  Administered 2015-05-25 (×2): via INTRAVENOUS

## 2015-05-25 MED ORDER — MIDAZOLAM HCL 2 MG/2ML IJ SOLN
INTRAMUSCULAR | Status: AC
  Administered 2015-05-25: 1 mg
  Filled 2015-05-25: qty 2

## 2015-05-25 MED ORDER — SODIUM CHLORIDE 0.9 % IJ SOLN
INTRAMUSCULAR | Status: DC | PRN
  Administered 2015-05-25: 10 mL
  Administered 2015-05-25: 20 mL
  Administered 2015-05-25 (×2): 10 mL

## 2015-05-25 MED ORDER — FENTANYL CITRATE (PF) 100 MCG/2ML IJ SOLN
INTRAMUSCULAR | Status: AC
  Administered 2015-05-25: 50 ug
  Filled 2015-05-25: qty 2

## 2015-05-25 MED ORDER — PHENYLEPHRINE HCL 10 MG/ML IJ SOLN
INTRAMUSCULAR | Status: DC | PRN
  Administered 2015-05-25 (×2): 80 ug via INTRAVENOUS
  Administered 2015-05-25: 120 ug via INTRAVENOUS

## 2015-05-25 MED ORDER — STERILE WATER FOR INJECTION IJ SOLN
INTRAMUSCULAR | Status: AC
  Filled 2015-05-25: qty 10

## 2015-05-25 MED ORDER — PHENYLEPHRINE HCL 10 MG/ML IJ SOLN
10.0000 mg | INTRAVENOUS | Status: DC | PRN
  Administered 2015-05-25: 40 ug/min via INTRAVENOUS

## 2015-05-25 MED ORDER — OXYCODONE-ACETAMINOPHEN 5-325 MG PO TABS: 1.0000 | ORAL_TABLET | Freq: Four times a day (QID) | ORAL | Status: DC | PRN

## 2015-05-25 MED ORDER — EPHEDRINE SULFATE 50 MG/ML IJ SOLN
INTRAMUSCULAR | Status: AC
  Filled 2015-05-25: qty 1

## 2015-05-25 MED ORDER — PROPOFOL 10 MG/ML IV BOLUS
INTRAVENOUS | Status: DC | PRN
Start: 2015-05-25 — End: 2015-05-25
  Administered 2015-05-25: 160 mg via INTRAVENOUS

## 2015-05-25 MED ORDER — ROCURONIUM BROMIDE 100 MG/10ML IV SOLN
INTRAVENOUS | Status: DC | PRN
  Administered 2015-05-25: 30 mg via INTRAVENOUS

## 2015-05-25 MED ORDER — ONDANSETRON HCL 4 MG/2ML IJ SOLN
INTRAMUSCULAR | Status: DC | PRN
  Administered 2015-05-25: 4 mg via INTRAVENOUS

## 2015-05-25 MED ORDER — EPHEDRINE SULFATE 50 MG/ML IJ SOLN
INTRAMUSCULAR | Status: DC | PRN
  Administered 2015-05-25 (×3): 10 mg via INTRAVENOUS

## 2015-05-25 MED ORDER — METHOCARBAMOL 500 MG PO TABS: 500.0000 mg | ORAL_TABLET | Freq: Four times a day (QID) | ORAL | Status: DC

## 2015-05-25 MED ORDER — EPINEPHRINE HCL 1 MG/ML IJ SOLN
INTRAMUSCULAR | Status: AC
  Filled 2015-05-25: qty 2

## 2015-05-25 MED ORDER — EPINEPHRINE HCL 1 MG/ML IJ SOLN
INTRAMUSCULAR | Status: DC | PRN
  Administered 2015-05-25: .1 mL

## 2015-05-25 SURGICAL SUPPLY — 81 items
ANCH SUT 2 FT CRKSCW 14.7 STRL (Anchor) ×2 IMPLANT
ANCH SUT PUSHLCK 24X4.5 STRL (Orthopedic Implant) ×2 IMPLANT
ANCHOR CORKSCREW BIO 5.5 FT (Anchor) ×2 IMPLANT
APL SKNCLS STERI-STRIP NONHPOA (GAUZE/BANDAGES/DRESSINGS)
BENZOIN TINCTURE PRP APPL 2/3 (GAUZE/BANDAGES/DRESSINGS) ×1 IMPLANT
BIT DRILL TAK (DRILL) IMPLANT
BLADE CUDA 5.5 (BLADE) IMPLANT
BLADE CUTTER GATOR 3.5 (BLADE) ×2 IMPLANT
BLADE GREAT WHITE 4.2 (BLADE) ×2 IMPLANT
BLADE SURG 11 STRL SS (BLADE) ×2 IMPLANT
BUR GATOR 2.9 (BURR) IMPLANT
BUR OVAL 6.0 (BURR) ×2 IMPLANT
CANNULA SHOULDER 7CM (CANNULA) IMPLANT
CARTRIDGE CURVETEK MED (MISCELLANEOUS) IMPLANT
CARTRIDGE CURVETEK XLRG (MISCELLANEOUS) IMPLANT
CLSR STERI-STRIP ANTIMIC 1/2X4 (GAUZE/BANDAGES/DRESSINGS) ×1 IMPLANT
COVER SURGICAL LIGHT HANDLE (MISCELLANEOUS) ×2 IMPLANT
DRAPE INCISE IOBAN 66X45 STRL (DRAPES) ×3 IMPLANT
DRAPE STERI 35X30 U-POUCH (DRAPES) ×2 IMPLANT
DRAPE U-SHAPE 47X51 STRL (DRAPES) ×4 IMPLANT
DRILL TAK (DRILL)
DRSG MEPILEX BORDER 4X4 (GAUZE/BANDAGES/DRESSINGS) ×1 IMPLANT
DRSG MEPILEX BORDER 4X8 (GAUZE/BANDAGES/DRESSINGS) ×1 IMPLANT
DRSG PAD ABDOMINAL 8X10 ST (GAUZE/BANDAGES/DRESSINGS) ×3 IMPLANT
DURAPREP 26ML APPLICATOR (WOUND CARE) ×2 IMPLANT
ELECT MENISCUS 165MM 90D (ELECTRODE) IMPLANT
ELECT REM PT RETURN 9FT ADLT (ELECTROSURGICAL) ×2
ELECTRODE REM PT RTRN 9FT ADLT (ELECTROSURGICAL) ×1 IMPLANT
FILTER STRAW FLUID ASPIR (MISCELLANEOUS) ×1 IMPLANT
GAUZE SPONGE 4X4 12PLY STRL (GAUZE/BANDAGES/DRESSINGS) ×1 IMPLANT
GAUZE XEROFORM 1X8 LF (GAUZE/BANDAGES/DRESSINGS) ×2 IMPLANT
GLOVE BIO SURGEON ST LM GN SZ9 (GLOVE) ×1 IMPLANT
GLOVE BIOGEL PI IND STRL 8 (GLOVE) ×1 IMPLANT
GLOVE BIOGEL PI INDICATOR 8 (GLOVE) ×1
GLOVE SURG ORTHO 8.0 STRL STRW (GLOVE) ×2 IMPLANT
GOWN STRL REUS W/ TWL LRG LVL3 (GOWN DISPOSABLE) ×3 IMPLANT
GOWN STRL REUS W/TWL LRG LVL3 (GOWN DISPOSABLE) ×6
KIT BASIN OR (CUSTOM PROCEDURE TRAY) ×2 IMPLANT
KIT ROOM TURNOVER OR (KITS) ×2 IMPLANT
MANIFOLD NEPTUNE II (INSTRUMENTS) ×2 IMPLANT
NDL HYPO 25X1 1.5 SAFETY (NEEDLE) ×1 IMPLANT
NDL SCORPION MULTI FIRE (NEEDLE) IMPLANT
NDL SPNL 18GX3.5 QUINCKE PK (NEEDLE) ×1 IMPLANT
NDL SUT 6 .5 CRC .975X.05 MAYO (NEEDLE) ×1 IMPLANT
NEEDLE HYPO 25X1 1.5 SAFETY (NEEDLE) ×2 IMPLANT
NEEDLE MAYO TAPER (NEEDLE) ×2
NEEDLE SCORPION MULTI FIRE (NEEDLE) ×2 IMPLANT
NEEDLE SPNL 18GX3.5 QUINCKE PK (NEEDLE) ×2 IMPLANT
NS IRRIG 1000ML POUR BTL (IV SOLUTION) ×2 IMPLANT
PACK SHOULDER (CUSTOM PROCEDURE TRAY) ×2 IMPLANT
PAD ARMBOARD 7.5X6 YLW CONV (MISCELLANEOUS) ×4 IMPLANT
PENCIL BUTTON HOLSTER BLD 10FT (ELECTRODE) ×1 IMPLANT
PUSHLOCK PEEK 4.5X24 (Orthopedic Implant) ×2 IMPLANT
SERFAS ENERGY 90-S MAX 4.0 MM ×1 IMPLANT
SET ARTHROSCOPY TUBING (MISCELLANEOUS) ×2
SET ARTHROSCOPY TUBING LN (MISCELLANEOUS) ×1 IMPLANT
SLING ARM IMMOBILIZER LRG (SOFTGOODS) ×1 IMPLANT
SLING ARM IMMOBILIZER MED (SOFTGOODS) IMPLANT
SPEAR FASTAKII (SLEEVE) IMPLANT
SPONGE LAP 4X18 X RAY DECT (DISPOSABLE) ×5 IMPLANT
STRIP CLOSURE SKIN 1/2X4 (GAUZE/BANDAGES/DRESSINGS) ×1 IMPLANT
SUCTION FRAZIER TIP 10 FR DISP (SUCTIONS) ×2 IMPLANT
SUT ETHILON 3 0 PS 1 (SUTURE) ×2 IMPLANT
SUT FIBERWIRE 2-0 18 17.9 3/8 (SUTURE) ×6
SUT MNCRL AB 3-0 PS2 18 (SUTURE) ×1 IMPLANT
SUT PROLENE 3 0 PS 2 (SUTURE) ×2 IMPLANT
SUT VIC AB 0 CT1 27 (SUTURE) ×6
SUT VIC AB 0 CT1 27XBRD ANBCTR (SUTURE) ×2 IMPLANT
SUT VIC AB 1 CT1 27 (SUTURE) ×2
SUT VIC AB 1 CT1 27XBRD ANBCTR (SUTURE) ×1 IMPLANT
SUT VIC AB 2-0 CT1 27 (SUTURE) ×2
SUT VIC AB 2-0 CT1 TAPERPNT 27 (SUTURE) ×1 IMPLANT
SUT VICRYL 0 UR6 27IN ABS (SUTURE) IMPLANT
SUTURE FIBERWR 2-0 18 17.9 3/8 (SUTURE) IMPLANT
SYR 20CC LL (SYRINGE) ×4 IMPLANT
SYR 3ML LL SCALE MARK (SYRINGE) ×2 IMPLANT
SYR TB 1ML LUER SLIP (SYRINGE) ×2 IMPLANT
TOWEL OR 17X24 6PK STRL BLUE (TOWEL DISPOSABLE) ×2 IMPLANT
TOWEL OR 17X26 10 PK STRL BLUE (TOWEL DISPOSABLE) ×2 IMPLANT
WAND HAND CNTRL MULTIVAC 90 (MISCELLANEOUS) IMPLANT
WATER STERILE IRR 1000ML POUR (IV SOLUTION) ×2 IMPLANT

## 2015-05-25 NOTE — Brief Op Note (Signed)
05/25/2015  1:43 PM  PATIENT:  Elijio Miles Cannata  74 y.o. female  PRE-OPERATIVE DIAGNOSIS:  LEFT SHOULDER ROTATOR CUFF TEAR, BICEPS TENDONITIS, SUBACROMIAL BURSITIS  POST-OPERATIVE DIAGNOSIS:  LEFT SHOULDER ROTATOR CUFF TEAR, BICEPS TENDONITIS, SUBACROMIAL BURSITIS  PROCEDURE:  Procedure(s): SHOULDER ARTHROSCOPY WITH SUBACROMIAL DECOMPRESSION, ROTATOR CUFF REPAIR AND BICEP TENODESIS, DEBRIDEMENT.   SURGEON:  Surgeon(s): Meredith Pel, MD  ASSISTANT: carla bethune rnfa  ANESTHESIA:   general  EBL: 25 ml    Total I/O In: 1000 [I.V.:1000] Out: 100 [Blood:100]  BLOOD ADMINISTERED: none  DRAINS: none   LOCAL MEDICATIONS USED:  none  SPECIMEN:  No Specimen  COUNTS:  YES  TOURNIQUET:  * No tourniquets in log *  DICTATION: .Other Dictation: Dictation [id number]   PLAN OF CARE: Discharge to home after PACU  PATIENT DISPOSITION:  PACU - hemodynamically stable

## 2015-05-25 NOTE — Anesthesia Preprocedure Evaluation (Addendum)
Anesthesia Evaluation  Patient identified by MRN, date of birth, ID band Patient awake    Reviewed: Allergy & Precautions, NPO status , Patient's Chart, lab work & pertinent test results, reviewed documented beta blocker date and time   Airway Mallampati: II  TM Distance: >3 FB Neck ROM: Full    Dental   Pulmonary sleep apnea and Continuous Positive Airway Pressure Ventilation , former smoker,  breath sounds clear to auscultation        Cardiovascular hypertension, Pt. on medications and Pt. on home beta blockers + Peripheral Vascular Disease Rhythm:Regular Rate:Normal     Neuro/Psych  Headaches,    GI/Hepatic Neg liver ROS, GERD-  ,  Endo/Other  diabetes, Type 2, Oral Hypoglycemic Agents  Renal/GU negative Renal ROS     Musculoskeletal  (+) Arthritis -,   Abdominal   Peds  Hematology negative hematology ROS (+)   Anesthesia Other Findings   Reproductive/Obstetrics                            Anesthesia Physical Anesthesia Plan  ASA: III  Anesthesia Plan: General and Regional   Post-op Pain Management:    Induction: Intravenous  Airway Management Planned: Oral ETT  Additional Equipment:   Intra-op Plan:   Post-operative Plan: Extubation in OR  Informed Consent: I have reviewed the patients History and Physical, chart, labs and discussed the procedure including the risks, benefits and alternatives for the proposed anesthesia with the patient or authorized representative who has indicated his/her understanding and acceptance.   Dental advisory given  Plan Discussed with: CRNA  Anesthesia Plan Comments:         Anesthesia Quick Evaluation

## 2015-05-25 NOTE — Anesthesia Procedure Notes (Addendum)
Anesthesia Regional Block:  Interscalene brachial plexus block  Pre-Anesthetic Checklist: ,, timeout performed, Correct Patient, Correct Site, Correct Laterality, Correct Procedure, Correct Position, site marked, Risks and benefits discussed,  Surgical consent,  Pre-op evaluation,  At surgeon's request and post-op pain management  Laterality: Left  Prep: chloraprep       Needles:  Injection technique: Single-shot  Needle Type: Echogenic Stimulator Needle     Needle Length: 9cm 9 cm Needle Gauge: 21 and 21 G    Additional Needles:  Procedures: ultrasound guided (picture in chart) and nerve stimulator Interscalene brachial plexus block  Nerve Stimulator or Paresthesia:  Response: biceps, 0.48 mA,   Additional Responses:   Narrative:  Start time: 05/25/2015 10:25 AM End time: 05/25/2015 10:35 AM Injection made incrementally with aspirations every 5 mL.  Performed by: Personally  Anesthesiologist: Suzette Battiest  Additional Notes: Risks and benefits discussed. Pt tolerated well with no immediate complications.   Procedure Name: Intubation Date/Time: 05/25/2015 11:12 AM Performed by: Melina Copa, Parul Porcelli R Pre-anesthesia Checklist: Patient identified, Emergency Drugs available, Suction available, Patient being monitored and Timeout performed Patient Re-evaluated:Patient Re-evaluated prior to inductionOxygen Delivery Method: Circle system utilized Preoxygenation: Pre-oxygenation with 100% oxygen Intubation Type: IV induction Ventilation: Two handed mask ventilation required, Oral airway inserted - appropriate to patient size and Mask ventilation with difficulty Laryngoscope Size: Miller and 3 Grade View: Grade I Tube type: Oral Tube size: 7.0 mm Airway Equipment and Method: Stylet Placement Confirmation: ETT inserted through vocal cords under direct vision,  positive ETCO2 and breath sounds checked- equal and bilateral Secured at: 22 cm Tube secured with: Tape Dental  Injury: Teeth and Oropharynx as per pre-operative assessment

## 2015-05-25 NOTE — H&P (Addendum)
Stephanie Barber is an 74 y.o. female.   Chief Complaint: Left shoulder pain  HPI: Stephanie Barber is a 74 year old female with left shoulder pain of several months duration she reports pain at night and pain with overhead lifting along with coarse grinding in the shoulder with any range of motion exercises. She's had right shoulder rotator cuff surgery and states of the left shoulder feels the same. Reports daily pain and pain which interferes with her ADLs as well as pain that interferes with her sleep she has had an injection which gave her temporary relief  Past Medical History  Diagnosis Date  . Hypertension   . Hyperlipidemia   . Arthritis   . Diabetes mellitus   . Chronic headache   . Heart murmur   . GERD (gastroesophageal reflux disease)   . Rectal prolapse   . Narcolepsy and cataplexy   . Migraine   . Intracranial atherosclerosis     per MRA  . High cholesterol   . Complication of anesthesia     allergy to Novocaine   . OSA on CPAP     residual AHI of-1.1, CPAPevery night     Past Surgical History  Procedure Laterality Date  . Total abdominal hysterectomy    . Rotator cuff repair      Left  . Kidney surgery  1998    Right.  growth removed  . Back surgery      L4,L5 discectomy  . Rectal prolapse repair  12/05/11  . Abdominal hysterectomy    . Cardiac catheterization    . Tubal ligation      Family History  Problem Relation Age of Onset  . Heart disease Mother   . Throat cancer Father   . Cancer Father     stomach  . Kidney cancer Brother   . Heart disease Brother    Social History:  reports that she quit smoking about 34 years ago. She has never used smokeless tobacco. She reports that she does not drink alcohol or use illicit drugs.  Allergies:  Allergies  Allergen Reactions  . Novocain [Procaine Hcl] Anaphylaxis  . Hydrocodone Diarrhea and Nausea And Vomiting  . Morphine And Related     Unknown.      Medications Prior to Admission  Medication  Sig Dispense Refill  . amLODipine (NORVASC) 10 MG tablet Take 10 mg by mouth daily after breakfast.     . aspirin 81 MG tablet Take 81 mg by mouth at bedtime.     Marland Kitchen BIOTIN PO Take 50 mg by mouth 2 (two) times daily.      . celecoxib (CELEBREX) 200 MG capsule Take 200 mg by mouth daily.     . fish oil-omega-3 fatty acids 1000 MG capsule Take 1 g by mouth daily.      Marland Kitchen gabapentin (NEURONTIN) 400 MG capsule Take 400 mg by mouth at bedtime.     Marland Kitchen losartan (COZAAR) 100 MG tablet Take 100 mg by mouth daily after breakfast.     . metFORMIN (GLUCOPHAGE) 500 MG tablet Take 500 mg by mouth 2 (two) times daily with a meal.     . metoprolol (TOPROL-XL) 50 MG 24 hr tablet Take 50 mg by mouth daily after breakfast.     . naproxen sodium (ANAPROX) 220 MG tablet Take 220 mg by mouth 2 (two) times daily with a meal.    . Polyethyl Glycol-Propyl Glycol (SYSTANE ULTRA) 0.4-0.3 % SOLN Place 1 drop into both eyes daily.      Marland Kitchen  polysaccharide iron (NIFEREX) 150 MG CAPS capsule Take 150 mg by mouth daily.     . ranitidine (ZANTAC) 300 MG capsule Take 300 mg by mouth 2 (two) times daily.       Results for orders placed or performed during the hospital encounter of 05/25/15 (from the past 48 hour(s))  Glucose, capillary     Status: None   Collection Time: 05/25/15  9:03 AM  Result Value Ref Range   Glucose-Capillary 92 65 - 99 mg/dL   No results found.  Review of Systems  Constitutional: Negative.   HENT: Negative.   Eyes: Negative.   Respiratory: Negative.   Cardiovascular: Negative.   Gastrointestinal: Negative.   Genitourinary: Negative.   Musculoskeletal: Positive for joint pain.  Skin: Negative.   Neurological: Negative.   Endo/Heme/Allergies: Negative.   Psychiatric/Behavioral: Negative.     Blood pressure 113/62, pulse 63, temperature 98 F (36.7 C), temperature source Oral, resp. rate 20, height 5' (1.524 m), weight 57.153 kg (126 lb), SpO2 100 %. Physical Exam  Constitutional: She appears  well-developed.  HENT:  Head: Normocephalic.  Eyes: Pupils are equal, round, and reactive to light.  Neck: Normal range of motion.  Cardiovascular: Normal rate.   Respiratory: Effort normal.  Neurological: She is alert.  Skin: Skin is warm.  Psychiatric: She has a normal mood and affect.   left shoulder exam demonstrates full passive range of motion some weakness to supraspinous testing there is some coarseness with passive range of motion not much in the way of ac joint tenderness to direct palpation impingement signs positive O'Brien's testing positive  Assessment/Plan Impression is left shoulder rotator cuff tear with minimal retraction of the supraspinous tendon patient also has biceps tendon rupture and stump of the biceps tendon and part of the labrum within the joint. Plan is arthroscopy debridement subacromial decompression mini open rotator cuff tear repair risk and benefits discussed with patient including not limited to infection or vessel damage shoulder stiffness as well as potential for incomplete healing due to bone quality patient extends risk benefits wished proceed all questions answered  Stephanie Barber 05/25/2015, 10:37 AM

## 2015-05-25 NOTE — Anesthesia Postprocedure Evaluation (Signed)
  Anesthesia Post-op Note  Patient: Stephanie Barber  Procedure(s) Performed: Procedure(s) with comments: SHOULDER ARTHROSCOPY WITH SUBACROMIAL DECOMPRESSION, ROTATOR CUFF REPAIR AND BICEP TENODESIS, DEBRIDEMENT.  (Left) - LEFT SHOULDER ROTATOR CUFF TEAR REPAIR, ARTHROSCOPY, DEBRIDEMENT, BICEPS TENODESIS, SUBACROMIAL DECOMPRESSION.  Patient Location: PACU  Anesthesia Type:GA combined with regional for post-op pain  Level of Consciousness: awake and alert   Airway and Oxygen Therapy: Patient Spontanous Breathing  Post-op Pain: none  Post-op Assessment: Post-op Vital signs reviewed              Post-op Vital Signs: Reviewed  Last Vitals:  Filed Vitals:   05/25/15 1500  BP: 123/58  Pulse: 63  Temp:   Resp: 12    Complications: No apparent anesthesia complications

## 2015-05-25 NOTE — Transfer of Care (Signed)
Immediate Anesthesia Transfer of Care Note  Patient: Stephanie Barber  Procedure(s) Performed: Procedure(s) with comments: SHOULDER ARTHROSCOPY WITH SUBACROMIAL DECOMPRESSION, ROTATOR CUFF REPAIR AND BICEP TENODESIS, DEBRIDEMENT.  (Left) - LEFT SHOULDER ROTATOR CUFF TEAR REPAIR, ARTHROSCOPY, DEBRIDEMENT, BICEPS TENODESIS, SUBACROMIAL DECOMPRESSION.  Patient Location: PACU  Anesthesia Type:General  Level of Consciousness: awake, alert  and oriented  Airway & Oxygen Therapy: Patient Spontanous Breathing and Patient connected to nasal cannula oxygen  Post-op Assessment: Report given to RN, Post -op Vital signs reviewed and stable and Patient moving all extremities  Post vital signs: Reviewed and stable  Last Vitals:  Filed Vitals:   05/25/15 0909  BP: 113/62  Pulse: 63  Temp: 36.7 C  Resp: 20    Complications: No apparent anesthesia complications

## 2015-05-26 ENCOUNTER — Encounter (HOSPITAL_COMMUNITY): Payer: Self-pay | Admitting: Orthopedic Surgery

## 2015-05-26 NOTE — Op Note (Signed)
NAME:  Stephanie Barber, Stephanie NO.:  [id number]  MEDICAL RECORD NO.:  [id number]  LOCATION:  [facility]                         FACILITY:  [facility]  PHYSICIAN:  Anderson Malta, M.D.    DATE OF BIRTH:  [birth year]  DATE OF PROCEDURE: DATE OF DISCHARGE:  05/25/2015                              OPERATIVE REPORT   PREOPERATIVE DIAGNOSIS:  Left shoulder rotator cuff tear and bursitis.  POSTOPERATIVE DIAGNOSIS:  Left shoulder rotator cuff tear, biceps tendonitis, subacromial bursitis.  Rotator cuff tear measures 2 x 2 cm.  PROCEDURE:  Left shoulder arthroscopy, debridement, biceps tendon release. subacromial decompression, mini open rotator cuff tear repair. and biceps tenodesis.  SURGEON:  Anderson Malta, M.D.  ASSISTANT:  Laure Kidney.  INDICATIONS:  Stephanie Barber is a 74 year old patient with left shoulder pain, presents for operative management after explanation of risks and benefits.  OPERATIVE FINDINGS: 1. Examination under anesthesia, range of motion.  External rotation     at 15 degrees abduction is 75, forward flexion 180, shoulder     stability, excellent 1+ anterior posterior left a cm sulcus sign. 2. Diagnosis operative arthroscopy.     a.     95% tear of the biceps tendon.     b.     Significant synovitis within the rotator interval and around      the superior labral  complex.     c.     Intact glenohumeral articular surfaces.     d.     2 x 2 cm rotator cuff tear.  PROCEDURE DETAIL:  The patient was brought to the operating room where general endotracheal anesthesia was used.  Preop antibiotics administered.  Time-out was called.  The patient was placed in a beach- chair position with the head in neutral position.  Left arm and shoulder were prescrubbed with alcohol and Betadine, and later allowed to air dry.  Prepped with DuraPrep solution, draped in sterile manner.  Charlie Pitter was used to cover the operative field including the axilla.  Posterior portal created  1.5 cm inferior and medial to the posterolateral margin of the acromion and diagnostic arthroscopy was performed.  The anterior portal created in direct visualization.  The patient had about a 95% tear of biceps tendon.  This was completed with the ArthroCare wand. Significant debridement was performed with both the shaver and the ArthroCare wand of the synovitis both within the rotator interval and in the superior aspect of the glenoid.  The anterior-inferior posterior- inferior glenohumeral ligaments were intact.  Degenerate rotator cuff tear measuring about 2 x 2 cm was encountered.  Minimal retraction. Following debridement of both the rotator cuff tear and the synovitis, the instruments were removed.  Portals closed using 3-0 nylon.  A 4 cm incision made off the anterolateral margin of the acromion.  Deltoid split was made and marked with a #0 Vicryl suture 4 cm from the anterolateral margin of acromion.  Deltoid was split.  The bursectomy performed, subacromial decompression performed with acromioplasty, rotator cuff tear was debrided, footprint was freshened with a curette. Tuberosity plasty was performed just at the lateral edge of the tuberosity. At this time, two 5.5 corkscrews were utilized. 8 sutures were  then placed in pairs. The knots were tied and then 1 limb from each pair was then crossed and this gave a mattress affect to pull down the rotator cuff in watertight fashion.  One suture placed anteriorly in the rotator interval for closure with the arm in external rotation, one placed posteriorly at the junction of the native rotator cuff and the tear in order to achieve a watertight closure.  With the arm in abduction. the sutures were then placed through 2 push locks with good watertight reduction achieved.  At this time, thorough irrigation performed, deltoid split closed using 0 Vicryl suture followed by interrupted inverted 2-0 Vicryl suture and 3-0 Monocryl.  Shoulder  sling were applied.  The patient tolerated the procedure well without immediate complication, transferred to the recovery room in stable condition.     Anderson Malta, M.D.     GSD/MEDQ  D:  05/25/2015  T:  05/26/2015  Job:  [id number]

## 2015-06-08 DIAGNOSIS — D509 Iron deficiency anemia, unspecified: Secondary | ICD-10-CM | POA: Diagnosis not present

## 2015-06-08 DIAGNOSIS — Z1389 Encounter for screening for other disorder: Secondary | ICD-10-CM | POA: Diagnosis not present

## 2015-06-08 DIAGNOSIS — E119 Type 2 diabetes mellitus without complications: Secondary | ICD-10-CM | POA: Diagnosis not present

## 2015-06-08 DIAGNOSIS — N390 Urinary tract infection, site not specified: Secondary | ICD-10-CM | POA: Diagnosis not present

## 2015-06-08 DIAGNOSIS — E780 Pure hypercholesterolemia: Secondary | ICD-10-CM | POA: Diagnosis not present

## 2015-06-08 DIAGNOSIS — Z0000 Encounter for general adult medical examination without abnormal findings: Secondary | ICD-10-CM | POA: Diagnosis not present

## 2015-06-08 DIAGNOSIS — I10 Essential (primary) hypertension: Secondary | ICD-10-CM | POA: Diagnosis not present

## 2015-06-08 DIAGNOSIS — M545 Low back pain: Secondary | ICD-10-CM | POA: Diagnosis not present

## 2015-06-08 DIAGNOSIS — K219 Gastro-esophageal reflux disease without esophagitis: Secondary | ICD-10-CM | POA: Diagnosis not present

## 2015-06-08 DIAGNOSIS — G5600 Carpal tunnel syndrome, unspecified upper limb: Secondary | ICD-10-CM | POA: Diagnosis not present

## 2015-06-15 DIAGNOSIS — M25512 Pain in left shoulder: Secondary | ICD-10-CM | POA: Diagnosis not present

## 2015-06-15 DIAGNOSIS — M75122 Complete rotator cuff tear or rupture of left shoulder, not specified as traumatic: Secondary | ICD-10-CM | POA: Diagnosis not present

## 2015-06-15 DIAGNOSIS — M6281 Muscle weakness (generalized): Secondary | ICD-10-CM | POA: Diagnosis not present

## 2015-06-18 DIAGNOSIS — M6281 Muscle weakness (generalized): Secondary | ICD-10-CM | POA: Diagnosis not present

## 2015-06-18 DIAGNOSIS — M75122 Complete rotator cuff tear or rupture of left shoulder, not specified as traumatic: Secondary | ICD-10-CM | POA: Diagnosis not present

## 2015-06-18 DIAGNOSIS — M25512 Pain in left shoulder: Secondary | ICD-10-CM | POA: Diagnosis not present

## 2015-06-22 DIAGNOSIS — M6281 Muscle weakness (generalized): Secondary | ICD-10-CM | POA: Diagnosis not present

## 2015-06-22 DIAGNOSIS — M75122 Complete rotator cuff tear or rupture of left shoulder, not specified as traumatic: Secondary | ICD-10-CM | POA: Diagnosis not present

## 2015-06-22 DIAGNOSIS — M25512 Pain in left shoulder: Secondary | ICD-10-CM | POA: Diagnosis not present

## 2015-06-24 DIAGNOSIS — M75122 Complete rotator cuff tear or rupture of left shoulder, not specified as traumatic: Secondary | ICD-10-CM | POA: Diagnosis not present

## 2015-06-24 DIAGNOSIS — M6281 Muscle weakness (generalized): Secondary | ICD-10-CM | POA: Diagnosis not present

## 2015-06-24 DIAGNOSIS — M25512 Pain in left shoulder: Secondary | ICD-10-CM | POA: Diagnosis not present

## 2015-06-28 ENCOUNTER — Ambulatory Visit (INDEPENDENT_AMBULATORY_CARE_PROVIDER_SITE_OTHER): Payer: Medicare Other | Admitting: Adult Health

## 2015-06-28 ENCOUNTER — Encounter: Payer: Self-pay | Admitting: Adult Health

## 2015-06-28 VITALS — Ht 60.0 in | Wt 122.0 lb

## 2015-06-28 DIAGNOSIS — G4733 Obstructive sleep apnea (adult) (pediatric): Secondary | ICD-10-CM

## 2015-06-28 DIAGNOSIS — Z9989 Dependence on other enabling machines and devices: Principal | ICD-10-CM

## 2015-06-28 NOTE — Progress Notes (Signed)
I agree with the assessment and plan as directed by NP .The patient is known to me .   Adreanna Fickel, MD  

## 2015-06-28 NOTE — Patient Instructions (Signed)
Please resume CPAP

## 2015-06-28 NOTE — Progress Notes (Signed)
PATIENT: Stephanie Barber DOB: [birth year]  REASON FOR VISIT: follow up- OSA on CPAP HISTORY FROM: patient  HISTORY OF PRESENT ILLNESS: Stephanie Barber is a 74 year old female with a history of obstructive sleep apnea on CPAP. She returns today for an evaluation. The patient reports that she has not been using her CPAP. She states that she approximately stopped using it in June due to a rotator cuff injury. She states that she was in alot of pain and could not tolerate the mask. She had surgery on her rotator cuff July 12. She has since been in physical therapy. She does plan to start the CPAP back once her pain is under better control. The patient has had compliance issues in the past with her CPAP. She returns today for an evaluation.  REVIEW OF SYSTEMS: Out of a complete 14 system review of symptoms, the patient complains only of the following symptoms, and all other reviewed systems are negative.  ALLERGIES: Allergies  Allergen Reactions  . Novocain [Procaine Hcl] Anaphylaxis  . Hydrocodone Diarrhea and Nausea And Vomiting  . Morphine And Related     Unknown.      HOME MEDICATIONS: Outpatient Prescriptions Prior to Visit  Medication Sig Dispense Refill  . amLODipine (NORVASC) 10 MG tablet Take 10 mg by mouth daily after breakfast.     . aspirin 81 MG tablet Take 81 mg by mouth at bedtime.     Marland Kitchen BIOTIN PO Take 50 mg by mouth 2 (two) times daily.      . celecoxib (CELEBREX) 200 MG capsule Take 200 mg by mouth daily.     . fish oil-omega-3 fatty acids 1000 MG capsule Take 1 g by mouth daily.      Marland Kitchen gabapentin (NEURONTIN) 400 MG capsule Take 400 mg by mouth at bedtime.     Marland Kitchen losartan (COZAAR) 100 MG tablet Take 100 mg by mouth daily after breakfast.     . metFORMIN (GLUCOPHAGE) 500 MG tablet Take 500 mg by mouth 2 (two) times daily with a meal.     . methocarbamol (ROBAXIN) 500 MG tablet Take 1 tablet (500 mg total) by mouth 4 (four) times daily. 30 tablet 0  . metoprolol  (TOPROL-XL) 50 MG 24 hr tablet Take 50 mg by mouth daily after breakfast.     . oxyCODONE-acetaminophen (ROXICET) 5-325 MG per tablet Take 1-2 tablets by mouth every 6 (six) hours as needed for severe pain. 60 tablet 0  . Polyethyl Glycol-Propyl Glycol (SYSTANE ULTRA) 0.4-0.3 % SOLN Place 1 drop into both eyes daily.      . polysaccharide iron (NIFEREX) 150 MG CAPS capsule Take 150 mg by mouth daily.     . ranitidine (ZANTAC) 300 MG capsule Take 300 mg by mouth 2 (two) times daily.      No facility-administered medications prior to visit.    PAST MEDICAL HISTORY: Past Medical History  Diagnosis Date  . Hypertension   . Hyperlipidemia   . Arthritis   . Diabetes mellitus   . Chronic headache   . Heart murmur   . GERD (gastroesophageal reflux disease)   . Rectal prolapse   . Narcolepsy and cataplexy   . Migraine   . Intracranial atherosclerosis     per MRA  . High cholesterol   . Complication of anesthesia     allergy to Novocaine   . OSA on CPAP     residual AHI of-1.1, CPAPevery night     PAST SURGICAL HISTORY: Past  Surgical History  Procedure Laterality Date  . Total abdominal hysterectomy    . Rotator cuff repair      Left  . Kidney surgery  1998    Right.  growth removed  . Back surgery      L4,L5 discectomy  . Rectal prolapse repair  12/05/11  . Abdominal hysterectomy    . Cardiac catheterization    . Tubal ligation    . Shoulder arthroscopy with subacromial decompression, rotator cuff repair and bicep tendon repair Left 05/25/2015    Procedure: SHOULDER ARTHROSCOPY WITH SUBACROMIAL DECOMPRESSION, ROTATOR CUFF REPAIR AND BICEP TENODESIS, DEBRIDEMENT. ;  Surgeon: Meredith Pel, MD;  Location: [facility];  Service: Orthopedics;  Laterality: Left;  LEFT SHOULDER ROTATOR CUFF TEAR REPAIR, ARTHROSCOPY, DEBRIDEMENT, BICEPS TENODESIS, SUBACROMIAL DECOMPRESSION.    FAMILY HISTORY: Family History  Problem Relation Age of Onset  . Heart disease Mother   . Throat cancer  Father   . Cancer Father     stomach  . Kidney cancer Brother   . Heart disease Brother     SOCIAL HISTORY: Social History   Social History  . Marital Status: Married    Spouse Name: [name]  . Number of Children: 4  . Years of Education: 14   Occupational History  . retired. prev worked at [employer].    Social History Main Topics  . Smoking status: Former Smoker    Quit date: 11/13/1980  . Smokeless tobacco: Never Used     Comment: quit in 1982  . Alcohol Use: No  . Drug Use: No  . Sexual Activity: Not on file   Other Topics Concern  . Not on file   Social History Narrative   21 st January 2014 , patient underwent PS and MSLT - MSLT had one  SREMs and an average time to fall asleep of  *.8 minutes , her Ewort is 20 points,: facit: this patient has severe hypersomnia and is at risk when driving. medication in form ogf nuvigil smaples had been dispensed to her but she ha not yet taken it. 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 In detail -discussion of diagnosis and treatment  takes place today , first with nuvigil and if insufficient, with XYREM. Her CPAP treats her OSA very well, and she uses it 6 hours  or more each night,  residual AHI of 1.1  would not allow for OSA to be still explaining this degree of sleepiness. 2 downloads were reviewed. labs reviewed.    Patient is married [name]) and lives at home with her husband and grandchild.   Patient has four children   Patient is retired.   Patient has a college education.   Patient is right-handed.   Patient does not drink any caffeine.            PHYSICAL EXAM  Filed Vitals:   06/28/15 0931  Height: 5' (1.524 m)  Weight: 122 lb (55.339 kg)   Body mass index is 23.83 kg/(m^2).  Generalized: Well developed, in no acute distress  Neck: Circumference 14  inches, Mallampati 3+  Neurological examination  Mentation: Alert oriented to time, place, history taking. Follows all commands speech and language fluent Cranial nerve II-XII: Pupils were equal round reactive to light. Extraocular movements were full, visual field were full on confrontational test.  Facial sensation and strength were normal. Uvula tongue midline. Head turning and shoulder shrug  were normal and symmetric. Motor: The motor testing reveals 5 over 5 strength of all 4 extremities. Good symmetric motor tone is noted throughout. Limited mobility of the left shoulder. Sensory: Sensory testing is intact to soft touch on all 4 extremities. No evidence of extinction is noted.  Coordination: Cerebellar testing reveals good finger-nose-finger and heel-to-shin bilaterally.  Gait and station: Gait is normal. . Romberg is negative. No drift is seen.  Reflexes: Deep tendon reflexes are symmetric and normal bilaterally.   DIAGNOSTIC DATA (LABS, IMAGING, TESTING) - I reviewed patient records, labs, notes, testing and imaging myself where available.  Lab Results  Component Value Date   WBC 5.0 05/13/2015   HGB 13.1 05/13/2015   HCT 38.4 05/13/2015   MCV 84.8 05/13/2015   PLT 229 05/13/2015      Component Value Date/Time   NA 142 05/13/2015 1532   K 4.7 05/13/2015 1532   CL 110 05/13/2015 1532   CO2 23 05/13/2015 1532   GLUCOSE 72 05/13/2015 1532   BUN 10 05/13/2015 1532   CREATININE 0.81 05/13/2015 1532   CALCIUM 10.2 05/13/2015 1532   PROT 7.3 05/20/2009 1400   ALBUMIN 3.9 05/20/2009 1400   AST 27 05/20/2009 1400   ALT 23 05/20/2009 1400   ALKPHOS 65 05/20/2009 1400   BILITOT 0.4 05/20/2009 1400   GFRNONAA >60 05/13/2015 1532   GFRAA >60 05/13/2015 1532   No results found for: CHOL, HDL, LDLCALC, LDLDIRECT, TRIG, CHOLHDL Lab Results  Component Value Date   HGBA1C 6.2* 05/13/2015   No results found for: VITAMINB12 No results found for: TSH    ASSESSMENT AND PLAN 74  y.o. year old female  has a past medical history of Hypertension; Hyperlipidemia; Arthritis; Diabetes mellitus; Chronic headache; Heart murmur; GERD (gastroesophageal reflux disease); Rectal prolapse; Narcolepsy and cataplexy; Migraine; Intracranial atherosclerosis; High cholesterol; Complication of anesthesia; and OSA on CPAP. here with:  1. Obstructive sleep apnea on CPAP  The patient has not been using her CPAP since suffering a rotator cuff injury. I have encouraged the patient to begin using the CPAP nightly. She verbalizes that she plans to start using the machine once her pain is under better control. She will return in 6 months for a CPAP download. If her symptoms worsen or she develops new symptoms she should let us know.    Ward Givens, MSN, NP-C 06/28/2015, 10:07 AM [facility] [address] [place], [place] [address] [phone]

## 2015-06-29 ENCOUNTER — Ambulatory Visit: Payer: Medicare Other | Admitting: Adult Health

## 2015-06-29 DIAGNOSIS — M75122 Complete rotator cuff tear or rupture of left shoulder, not specified as traumatic: Secondary | ICD-10-CM | POA: Diagnosis not present

## 2015-06-29 DIAGNOSIS — M6281 Muscle weakness (generalized): Secondary | ICD-10-CM | POA: Diagnosis not present

## 2015-06-29 DIAGNOSIS — M25512 Pain in left shoulder: Secondary | ICD-10-CM | POA: Diagnosis not present

## 2015-07-01 DIAGNOSIS — M25512 Pain in left shoulder: Secondary | ICD-10-CM | POA: Diagnosis not present

## 2015-07-01 DIAGNOSIS — M75122 Complete rotator cuff tear or rupture of left shoulder, not specified as traumatic: Secondary | ICD-10-CM | POA: Diagnosis not present

## 2015-07-01 DIAGNOSIS — M6281 Muscle weakness (generalized): Secondary | ICD-10-CM | POA: Diagnosis not present

## 2015-07-06 DIAGNOSIS — M25512 Pain in left shoulder: Secondary | ICD-10-CM | POA: Diagnosis not present

## 2015-07-06 DIAGNOSIS — M75122 Complete rotator cuff tear or rupture of left shoulder, not specified as traumatic: Secondary | ICD-10-CM | POA: Diagnosis not present

## 2015-07-06 DIAGNOSIS — M6281 Muscle weakness (generalized): Secondary | ICD-10-CM | POA: Diagnosis not present

## 2015-07-08 DIAGNOSIS — M6281 Muscle weakness (generalized): Secondary | ICD-10-CM | POA: Diagnosis not present

## 2015-07-08 DIAGNOSIS — M25512 Pain in left shoulder: Secondary | ICD-10-CM | POA: Diagnosis not present

## 2015-07-08 DIAGNOSIS — M75122 Complete rotator cuff tear or rupture of left shoulder, not specified as traumatic: Secondary | ICD-10-CM | POA: Diagnosis not present

## 2015-07-13 DIAGNOSIS — M75122 Complete rotator cuff tear or rupture of left shoulder, not specified as traumatic: Secondary | ICD-10-CM | POA: Diagnosis not present

## 2015-07-13 DIAGNOSIS — M25512 Pain in left shoulder: Secondary | ICD-10-CM | POA: Diagnosis not present

## 2015-07-13 DIAGNOSIS — M6281 Muscle weakness (generalized): Secondary | ICD-10-CM | POA: Diagnosis not present

## 2015-07-15 DIAGNOSIS — M25512 Pain in left shoulder: Secondary | ICD-10-CM | POA: Diagnosis not present

## 2015-07-15 DIAGNOSIS — M6281 Muscle weakness (generalized): Secondary | ICD-10-CM | POA: Diagnosis not present

## 2015-07-15 DIAGNOSIS — M75122 Complete rotator cuff tear or rupture of left shoulder, not specified as traumatic: Secondary | ICD-10-CM | POA: Diagnosis not present

## 2015-07-20 DIAGNOSIS — M6281 Muscle weakness (generalized): Secondary | ICD-10-CM | POA: Diagnosis not present

## 2015-07-20 DIAGNOSIS — M75122 Complete rotator cuff tear or rupture of left shoulder, not specified as traumatic: Secondary | ICD-10-CM | POA: Diagnosis not present

## 2015-07-20 DIAGNOSIS — M25512 Pain in left shoulder: Secondary | ICD-10-CM | POA: Diagnosis not present

## 2015-07-22 DIAGNOSIS — M25512 Pain in left shoulder: Secondary | ICD-10-CM | POA: Diagnosis not present

## 2015-07-22 DIAGNOSIS — M6281 Muscle weakness (generalized): Secondary | ICD-10-CM | POA: Diagnosis not present

## 2015-07-22 DIAGNOSIS — M75122 Complete rotator cuff tear or rupture of left shoulder, not specified as traumatic: Secondary | ICD-10-CM | POA: Diagnosis not present

## 2015-07-26 DIAGNOSIS — M75122 Complete rotator cuff tear or rupture of left shoulder, not specified as traumatic: Secondary | ICD-10-CM | POA: Diagnosis not present

## 2015-07-26 DIAGNOSIS — M6281 Muscle weakness (generalized): Secondary | ICD-10-CM | POA: Diagnosis not present

## 2015-07-26 DIAGNOSIS — M25512 Pain in left shoulder: Secondary | ICD-10-CM | POA: Diagnosis not present

## 2015-07-28 ENCOUNTER — Other Ambulatory Visit: Payer: Self-pay | Admitting: Orthopedic Surgery

## 2015-07-28 DIAGNOSIS — M25512 Pain in left shoulder: Secondary | ICD-10-CM | POA: Diagnosis not present

## 2015-07-28 DIAGNOSIS — M7522 Bicipital tendinitis, left shoulder: Secondary | ICD-10-CM | POA: Diagnosis not present

## 2015-07-28 DIAGNOSIS — M542 Cervicalgia: Secondary | ICD-10-CM | POA: Diagnosis not present

## 2015-07-28 DIAGNOSIS — M75122 Complete rotator cuff tear or rupture of left shoulder, not specified as traumatic: Secondary | ICD-10-CM | POA: Diagnosis not present

## 2015-07-29 DIAGNOSIS — M25512 Pain in left shoulder: Secondary | ICD-10-CM | POA: Diagnosis not present

## 2015-07-29 DIAGNOSIS — M75122 Complete rotator cuff tear or rupture of left shoulder, not specified as traumatic: Secondary | ICD-10-CM | POA: Diagnosis not present

## 2015-07-29 DIAGNOSIS — M6281 Muscle weakness (generalized): Secondary | ICD-10-CM | POA: Diagnosis not present

## 2015-08-06 DIAGNOSIS — M75122 Complete rotator cuff tear or rupture of left shoulder, not specified as traumatic: Secondary | ICD-10-CM | POA: Diagnosis not present

## 2015-08-06 DIAGNOSIS — M6281 Muscle weakness (generalized): Secondary | ICD-10-CM | POA: Diagnosis not present

## 2015-08-06 DIAGNOSIS — M25512 Pain in left shoulder: Secondary | ICD-10-CM | POA: Diagnosis not present

## 2015-08-09 DIAGNOSIS — M6281 Muscle weakness (generalized): Secondary | ICD-10-CM | POA: Diagnosis not present

## 2015-08-09 DIAGNOSIS — M75122 Complete rotator cuff tear or rupture of left shoulder, not specified as traumatic: Secondary | ICD-10-CM | POA: Diagnosis not present

## 2015-08-09 DIAGNOSIS — M25512 Pain in left shoulder: Secondary | ICD-10-CM | POA: Diagnosis not present

## 2015-08-12 DIAGNOSIS — M75122 Complete rotator cuff tear or rupture of left shoulder, not specified as traumatic: Secondary | ICD-10-CM | POA: Diagnosis not present

## 2015-08-12 DIAGNOSIS — M25512 Pain in left shoulder: Secondary | ICD-10-CM | POA: Diagnosis not present

## 2015-08-12 DIAGNOSIS — M6281 Muscle weakness (generalized): Secondary | ICD-10-CM | POA: Diagnosis not present

## 2015-08-13 ENCOUNTER — Ambulatory Visit
Admission: RE | Admit: 2015-08-13 | Discharge: 2015-08-13 | Disposition: A | Discharge status: Home / Self Care | Payer: Medicare Other | Source: Ambulatory Visit | Attending: Orthopedic Surgery | Admitting: Orthopedic Surgery

## 2015-08-13 DIAGNOSIS — M542 Cervicalgia: Secondary | ICD-10-CM

## 2015-08-13 DIAGNOSIS — M4802 Spinal stenosis, cervical region: Secondary | ICD-10-CM | POA: Diagnosis not present

## 2015-08-18 DIAGNOSIS — M5020 Other cervical disc displacement, unspecified cervical region: Secondary | ICD-10-CM | POA: Diagnosis not present

## 2015-08-18 DIAGNOSIS — M6281 Muscle weakness (generalized): Secondary | ICD-10-CM | POA: Diagnosis not present

## 2015-08-18 DIAGNOSIS — M542 Cervicalgia: Secondary | ICD-10-CM | POA: Diagnosis not present

## 2015-08-18 DIAGNOSIS — M79602 Pain in left arm: Secondary | ICD-10-CM | POA: Diagnosis not present

## 2015-08-20 DIAGNOSIS — M5020 Other cervical disc displacement, unspecified cervical region: Secondary | ICD-10-CM | POA: Diagnosis not present

## 2015-08-20 DIAGNOSIS — M6281 Muscle weakness (generalized): Secondary | ICD-10-CM | POA: Diagnosis not present

## 2015-08-20 DIAGNOSIS — M542 Cervicalgia: Secondary | ICD-10-CM | POA: Diagnosis not present

## 2015-08-20 DIAGNOSIS — M79602 Pain in left arm: Secondary | ICD-10-CM | POA: Diagnosis not present

## 2015-08-24 DIAGNOSIS — M79602 Pain in left arm: Secondary | ICD-10-CM | POA: Diagnosis not present

## 2015-08-24 DIAGNOSIS — M542 Cervicalgia: Secondary | ICD-10-CM | POA: Diagnosis not present

## 2015-08-24 DIAGNOSIS — M5020 Other cervical disc displacement, unspecified cervical region: Secondary | ICD-10-CM | POA: Diagnosis not present

## 2015-08-24 DIAGNOSIS — M6281 Muscle weakness (generalized): Secondary | ICD-10-CM | POA: Diagnosis not present

## 2015-08-30 DIAGNOSIS — M542 Cervicalgia: Secondary | ICD-10-CM | POA: Diagnosis not present

## 2015-08-30 DIAGNOSIS — M6281 Muscle weakness (generalized): Secondary | ICD-10-CM | POA: Diagnosis not present

## 2015-08-30 DIAGNOSIS — M5020 Other cervical disc displacement, unspecified cervical region: Secondary | ICD-10-CM | POA: Diagnosis not present

## 2015-08-30 DIAGNOSIS — M79602 Pain in left arm: Secondary | ICD-10-CM | POA: Diagnosis not present

## 2015-09-01 DIAGNOSIS — M47812 Spondylosis without myelopathy or radiculopathy, cervical region: Secondary | ICD-10-CM | POA: Diagnosis not present

## 2015-09-01 DIAGNOSIS — M542 Cervicalgia: Secondary | ICD-10-CM | POA: Diagnosis not present

## 2015-09-02 DIAGNOSIS — M542 Cervicalgia: Secondary | ICD-10-CM | POA: Diagnosis not present

## 2015-09-02 DIAGNOSIS — M5020 Other cervical disc displacement, unspecified cervical region: Secondary | ICD-10-CM | POA: Diagnosis not present

## 2015-09-02 DIAGNOSIS — M79602 Pain in left arm: Secondary | ICD-10-CM | POA: Diagnosis not present

## 2015-09-02 DIAGNOSIS — M6281 Muscle weakness (generalized): Secondary | ICD-10-CM | POA: Diagnosis not present

## 2015-09-06 DIAGNOSIS — M79602 Pain in left arm: Secondary | ICD-10-CM | POA: Diagnosis not present

## 2015-09-06 DIAGNOSIS — M6281 Muscle weakness (generalized): Secondary | ICD-10-CM | POA: Diagnosis not present

## 2015-09-06 DIAGNOSIS — M5020 Other cervical disc displacement, unspecified cervical region: Secondary | ICD-10-CM | POA: Diagnosis not present

## 2015-09-06 DIAGNOSIS — M542 Cervicalgia: Secondary | ICD-10-CM | POA: Diagnosis not present

## 2015-09-07 DIAGNOSIS — M542 Cervicalgia: Secondary | ICD-10-CM | POA: Diagnosis not present

## 2015-09-07 DIAGNOSIS — M47812 Spondylosis without myelopathy or radiculopathy, cervical region: Secondary | ICD-10-CM | POA: Diagnosis not present

## 2015-09-09 DIAGNOSIS — M542 Cervicalgia: Secondary | ICD-10-CM | POA: Diagnosis not present

## 2015-09-09 DIAGNOSIS — M6281 Muscle weakness (generalized): Secondary | ICD-10-CM | POA: Diagnosis not present

## 2015-09-09 DIAGNOSIS — M5020 Other cervical disc displacement, unspecified cervical region: Secondary | ICD-10-CM | POA: Diagnosis not present

## 2015-09-09 DIAGNOSIS — M79602 Pain in left arm: Secondary | ICD-10-CM | POA: Diagnosis not present

## 2015-09-15 DIAGNOSIS — M79602 Pain in left arm: Secondary | ICD-10-CM | POA: Diagnosis not present

## 2015-09-15 DIAGNOSIS — M6281 Muscle weakness (generalized): Secondary | ICD-10-CM | POA: Diagnosis not present

## 2015-09-15 DIAGNOSIS — M542 Cervicalgia: Secondary | ICD-10-CM | POA: Diagnosis not present

## 2015-09-15 DIAGNOSIS — M5020 Other cervical disc displacement, unspecified cervical region: Secondary | ICD-10-CM | POA: Diagnosis not present

## 2015-09-17 DIAGNOSIS — M5020 Other cervical disc displacement, unspecified cervical region: Secondary | ICD-10-CM | POA: Diagnosis not present

## 2015-09-17 DIAGNOSIS — M542 Cervicalgia: Secondary | ICD-10-CM | POA: Diagnosis not present

## 2015-09-17 DIAGNOSIS — M6281 Muscle weakness (generalized): Secondary | ICD-10-CM | POA: Diagnosis not present

## 2015-09-17 DIAGNOSIS — M79602 Pain in left arm: Secondary | ICD-10-CM | POA: Diagnosis not present

## 2015-09-20 DIAGNOSIS — M5020 Other cervical disc displacement, unspecified cervical region: Secondary | ICD-10-CM | POA: Diagnosis not present

## 2015-09-20 DIAGNOSIS — M79602 Pain in left arm: Secondary | ICD-10-CM | POA: Diagnosis not present

## 2015-09-20 DIAGNOSIS — M6281 Muscle weakness (generalized): Secondary | ICD-10-CM | POA: Diagnosis not present

## 2015-09-20 DIAGNOSIS — M542 Cervicalgia: Secondary | ICD-10-CM | POA: Diagnosis not present

## 2015-09-23 DIAGNOSIS — M6281 Muscle weakness (generalized): Secondary | ICD-10-CM | POA: Diagnosis not present

## 2015-09-23 DIAGNOSIS — M75122 Complete rotator cuff tear or rupture of left shoulder, not specified as traumatic: Secondary | ICD-10-CM | POA: Diagnosis not present

## 2015-09-23 DIAGNOSIS — M79645 Pain in left finger(s): Secondary | ICD-10-CM | POA: Diagnosis not present

## 2015-09-23 DIAGNOSIS — M5020 Other cervical disc displacement, unspecified cervical region: Secondary | ICD-10-CM | POA: Diagnosis not present

## 2015-09-23 DIAGNOSIS — M79602 Pain in left arm: Secondary | ICD-10-CM | POA: Diagnosis not present

## 2015-09-23 DIAGNOSIS — M542 Cervicalgia: Secondary | ICD-10-CM | POA: Diagnosis not present

## 2015-09-23 DIAGNOSIS — M47812 Spondylosis without myelopathy or radiculopathy, cervical region: Secondary | ICD-10-CM | POA: Diagnosis not present

## 2015-09-30 DIAGNOSIS — M79602 Pain in left arm: Secondary | ICD-10-CM | POA: Diagnosis not present

## 2015-09-30 DIAGNOSIS — M6281 Muscle weakness (generalized): Secondary | ICD-10-CM | POA: Diagnosis not present

## 2015-09-30 DIAGNOSIS — M5020 Other cervical disc displacement, unspecified cervical region: Secondary | ICD-10-CM | POA: Diagnosis not present

## 2015-09-30 DIAGNOSIS — M542 Cervicalgia: Secondary | ICD-10-CM | POA: Diagnosis not present

## 2015-10-19 DIAGNOSIS — Z23 Encounter for immunization: Secondary | ICD-10-CM | POA: Diagnosis not present

## 2015-10-19 DIAGNOSIS — E119 Type 2 diabetes mellitus without complications: Secondary | ICD-10-CM | POA: Diagnosis not present

## 2015-10-19 DIAGNOSIS — G5600 Carpal tunnel syndrome, unspecified upper limb: Secondary | ICD-10-CM | POA: Diagnosis not present

## 2015-10-19 DIAGNOSIS — M5020 Other cervical disc displacement, unspecified cervical region: Secondary | ICD-10-CM | POA: Diagnosis not present

## 2015-10-19 DIAGNOSIS — M79602 Pain in left arm: Secondary | ICD-10-CM | POA: Diagnosis not present

## 2015-10-19 DIAGNOSIS — R634 Abnormal weight loss: Secondary | ICD-10-CM | POA: Diagnosis not present

## 2015-10-19 DIAGNOSIS — M542 Cervicalgia: Secondary | ICD-10-CM | POA: Diagnosis not present

## 2015-10-19 DIAGNOSIS — M6281 Muscle weakness (generalized): Secondary | ICD-10-CM | POA: Diagnosis not present

## 2015-10-28 DIAGNOSIS — M542 Cervicalgia: Secondary | ICD-10-CM | POA: Diagnosis not present

## 2015-10-28 DIAGNOSIS — M79602 Pain in left arm: Secondary | ICD-10-CM | POA: Diagnosis not present

## 2015-10-28 DIAGNOSIS — M6281 Muscle weakness (generalized): Secondary | ICD-10-CM | POA: Diagnosis not present

## 2015-10-28 DIAGNOSIS — M5020 Other cervical disc displacement, unspecified cervical region: Secondary | ICD-10-CM | POA: Diagnosis not present

## 2015-11-01 DIAGNOSIS — R202 Paresthesia of skin: Secondary | ICD-10-CM | POA: Diagnosis not present

## 2015-11-01 DIAGNOSIS — R197 Diarrhea, unspecified: Secondary | ICD-10-CM | POA: Diagnosis not present

## 2015-11-04 DIAGNOSIS — M6281 Muscle weakness (generalized): Secondary | ICD-10-CM | POA: Diagnosis not present

## 2015-11-04 DIAGNOSIS — M5020 Other cervical disc displacement, unspecified cervical region: Secondary | ICD-10-CM | POA: Diagnosis not present

## 2015-11-04 DIAGNOSIS — M79602 Pain in left arm: Secondary | ICD-10-CM | POA: Diagnosis not present

## 2015-11-04 DIAGNOSIS — M542 Cervicalgia: Secondary | ICD-10-CM | POA: Diagnosis not present

## 2015-11-18 DIAGNOSIS — M542 Cervicalgia: Secondary | ICD-10-CM | POA: Diagnosis not present

## 2015-11-18 DIAGNOSIS — M75122 Complete rotator cuff tear or rupture of left shoulder, not specified as traumatic: Secondary | ICD-10-CM | POA: Diagnosis not present

## 2015-11-18 DIAGNOSIS — M65352 Trigger finger, left little finger: Secondary | ICD-10-CM | POA: Diagnosis not present

## 2015-12-09 DIAGNOSIS — E7800 Pure hypercholesterolemia, unspecified: Secondary | ICD-10-CM | POA: Diagnosis not present

## 2015-12-09 DIAGNOSIS — I10 Essential (primary) hypertension: Secondary | ICD-10-CM | POA: Diagnosis not present

## 2015-12-09 DIAGNOSIS — E119 Type 2 diabetes mellitus without complications: Secondary | ICD-10-CM | POA: Diagnosis not present

## 2015-12-09 DIAGNOSIS — K219 Gastro-esophageal reflux disease without esophagitis: Secondary | ICD-10-CM | POA: Diagnosis not present

## 2015-12-09 DIAGNOSIS — Z1389 Encounter for screening for other disorder: Secondary | ICD-10-CM | POA: Diagnosis not present

## 2015-12-09 DIAGNOSIS — D509 Iron deficiency anemia, unspecified: Secondary | ICD-10-CM | POA: Diagnosis not present

## 2015-12-09 DIAGNOSIS — G5600 Carpal tunnel syndrome, unspecified upper limb: Secondary | ICD-10-CM | POA: Diagnosis not present

## 2015-12-09 DIAGNOSIS — Z7984 Long term (current) use of oral hypoglycemic drugs: Secondary | ICD-10-CM | POA: Diagnosis not present

## 2015-12-09 DIAGNOSIS — M545 Low back pain: Secondary | ICD-10-CM | POA: Diagnosis not present

## 2015-12-28 ENCOUNTER — Encounter: Payer: Self-pay | Admitting: Neurology

## 2015-12-28 ENCOUNTER — Ambulatory Visit (INDEPENDENT_AMBULATORY_CARE_PROVIDER_SITE_OTHER): Payer: Medicare Other | Admitting: Neurology

## 2015-12-28 VITALS — BP 130/70 | HR 76 | Resp 20 | Ht 59.0 in | Wt 124.0 lb

## 2015-12-28 DIAGNOSIS — G4733 Obstructive sleep apnea (adult) (pediatric): Secondary | ICD-10-CM | POA: Diagnosis not present

## 2015-12-28 DIAGNOSIS — Z9114 Patient's other noncompliance with medication regimen: Secondary | ICD-10-CM | POA: Diagnosis not present

## 2015-12-28 NOTE — Progress Notes (Signed)
PATIENT: Stephanie Barber   Primary physicia, [name], MD  at [facility],  PRIMARY NEUROLOGIST ; Dr Krista Blue.  DOB: 1941-11-12  HISTORICAL   Interval history ; the patient is followed by me, Dr. Asencion Partridge Ellarie Picking, for sleep related issues only -she underwent a sleep study on 07-04-12 after her Epworth sleepiness score was endorsed at 21 points. Physician was Dr. Krista Blue.  Epworth sleepiness score at 12 points,  fatigue severity at 19 points , and geriatric depression score at 2  points. She is doing much better - with repeated a Mini-Mental status exam  Was 28/30 and she scored 22/30 points 75 years old last April.. As the compliance for the last 30 days was measured at 6 hours and 22 minutes daily-and  Median daily usage was  5 hours 59 minutes,  residual AHI is 2.1 pressure is 6 cm water was 1 cm EPR . the patient has a moderate air leak. The patient has not used CPAP  After May until August  As was initially prescribed but has now begun to be compliant - she reports the headaches have kept her from using it.  This was after the fall at [place] of this year and. I think that it is time for her to resume a regular use of CPAP and I would like for her to be evaluated for possible sinusitis.  I know she had a CT scan when she came to the hospital emergency room after the fall,  which did not show any sinus related issues at the time. I will order a Sinus CT and have her follow  up with Dr Krista Blue / NP. Memory is not affected at this point. MOCA should be used for next visit.    10-26-14 Patient here again to see me for ? CPAP ? Marland Kitchen  The patient has now a 70% compliance for 63 out of 90 days on CPAP the average daily usage is 4 hours and 17 minutes the machine is set at 6 cm water was 1 cm EPR, residual AHI is 3.5. She does have high air leak data, these correlate with some of the higher nights on AHI is 2. I will encourage her to continue using CPAP she may prefer a nasal pillow over and nasal covering mask. Her  Epworth sleepiness score is endorsed at 18 points and her fatigue severity score at 31 points. There has been no recent change on medication. No recent falls. The patient reports that her memory has actually improved and she uses CPAP.  Once again, her visit should have been with Dr. Krista Blue or nurse practitioner.  12-28-15, The patient saw Ward Givens, NP  and her last visit. She returns today with an Epworth sleepiness score of 13 points fatigue severity of 33 points, geriatric depression score of 1.. Her sleepiness has increased since she reports not having used her CPAP compliantly. She had a shoulder surgery and bilateral carpal tunnel treatment she was not able to dexterity to use CPAP. She is now recovered from the surgery. She has been sent regularly new equipment and supplies through her durable medical equipment company. She has not used the machine for the last 4 months. By Medicare compliance she would not be able to get coverage for CPAP from now on. She insists that the [place] will follow her. Will transfer her sleep care to [facility]. This is not the fit rst time she is non compliant and she has not told me about medical difficulties until  I see her for a compliance visit.  [facility]    Last visit Dr Krista Blue :   Stephanie Barber  is a 75 years old right-handed African American female, accompanied by her daughter, referred by her primary care physician Dr. [name] Read for evaluation of transient sharp headaches.  06/19/12 by Dr. Marcial Pacas.  She has past medical history of migraine headaches, hypertension, hyperlipidemia, diabetes for more than 1 decade, she suffered iron deficiency anemia in 2012, hemoglobin was 9.5, now normalized with iron supplement, her A1c was 10 in 2012, most recent was 5.9 she also has diabetic peripheral neuropathy. Since May 2013, she had frequent episode of sharp transient shooting pain starting from her occipital region spreading forward to her  right forehead, it is usually involving the right side, occasionally on the left side, lasting a few seconds, no loss of consciousness, no lingering pain, EEG normal.  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 She had sleep study in August 2013, showed mild of shotting sleep apnea, poor sleep efficiency, prolonged latency to REM sleep, she is using CPAP machine, Has been followed up by Dr. Brett Fairy a few times, the most recent followup was in April 2015, but she forgot to bring her CPAP machine,  She also underwent PS and MSLT - MSLT had one  SREMs and an average time to fall asleep of  *.8 minutes , her Epwort was20 points,:  facit: this patient has severe hypersomnia and is at risk when driving. medication in form of nuvigil samples had been dispensed to her but she ha not yet taken it.  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   UPDATE Apr 02 2014: She has hard time keep things together,  She used non compliantly CPAP in the past and,s he forget machine at her scheduled visit with Dr. Brett Fairy in April 2015, she fell at [facility] Sunday, fell face-foward to her right frontal area, may be transient loss of consciousness, went to [facility] ED, . No acute intracranial findings.  No facial bone fractures.  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 SHAINAH TALWAR is a 75 y.o. female complains of  excessive sleepiness, possible  sinusitis.This has exacerbated while on CPAP, she had non more of the headaches for the first 6 month on CPAP. Continued excessive daytime sleepiness but to a moderate degree ,Patient is now compliant with CPAP.   Per patient, she had laboratory evaluation by her primary care physician in April/ may , reported normal TSH, B12, and rest of the laboratory evaluation, The most bothersome symptoms for her are excessive daytime sleepiness, ESS is 19, FSS 27, she denies headaches, no lateralized motor or sensory deficit, no gait difficulty, still exercising daily,  Review of Systems  Out of a complete 14 system review, the patient complains of only the following symptoms, and all other reviewed systems are negative.   muscle cramps, right ear tinitus and  Right eye ptosis.no diplopia  and no SOB. Headaches as pressure behind the eye , worsening with  valsalva and bending .     ALLERGIES: Allergies  Allergen Reactions  . Novocain [Procaine Hcl] Anaphylaxis  . Hydrocodone Diarrhea and Nausea And Vomiting  . Morphine And Related     Unknown.      HOME MEDICATIONS: Current Outpatient Prescriptions on File Prior to Visit  Medication Sig Dispense Refill  . amLODipine (NORVASC) 10 MG tablet Take 10 mg by mouth daily after breakfast.     . aspirin 81 MG tablet Take 81 mg by mouth at bedtime.     Marland Kitchen BIOTIN PO Take 50 mg by mouth 2 (two) times daily.      . celecoxib (CELEBREX) 200 MG capsule Take 200 mg by mouth daily.     . fish oil-omega-3 fatty acids 1000 MG capsule Take 1 g by mouth daily.      Marland Kitchen gabapentin (NEURONTIN) 400 MG capsule Take 400 mg by mouth at bedtime.     Marland Kitchen losartan (COZAAR) 100 MG tablet Take 100 mg by mouth daily after breakfast.     . metFORMIN (GLUCOPHAGE) 500 MG tablet Take 500 mg by mouth 2 (two) times daily with a meal.     . methocarbamol (ROBAXIN) 500 MG tablet Take 1 tablet (500 mg total) by mouth 4 (four) times daily. 30 tablet 0  . metoprolol (TOPROL-XL) 50 MG 24 hr  tablet Take 50 mg by mouth daily after breakfast.     . oxyCODONE-acetaminophen (ROXICET) 5-325 MG per tablet Take 1-2 tablets by mouth every 6 (six) hours as needed for severe pain. 60 tablet 0  . Polyethyl Glycol-Propyl Glycol (SYSTANE ULTRA) 0.4-0.3 % SOLN Place 1 drop into both eyes daily.      . polysaccharide iron (NIFEREX) 150 MG CAPS capsule Take 150 mg by mouth daily.     . ranitidine (ZANTAC) 300 MG capsule Take 300 mg by mouth 2 (two) times daily.      No current facility-administered medications on file prior to visit.    PAST MEDICAL HISTORY: Past Medical History  Diagnosis Date  . Hypertension   . Hyperlipidemia   . Arthritis   . Diabetes mellitus   . Chronic headache   . Heart murmur   . GERD (gastroesophageal reflux disease)   . Rectal prolapse   . Narcolepsy and cataplexy   . Migraine   . Intracranial atherosclerosis     per MRA  . High cholesterol   . Complication of anesthesia     allergy to Novocaine   . OSA on CPAP     residual AHI of-1.1, CPAPevery night     PAST SURGICAL HISTORY: Past Surgical History  Procedure Laterality Date  . Total abdominal hysterectomy    . Rotator cuff repair      Left  . Kidney surgery  1998    Right.  growth removed  . Back surgery      L4,L5 discectomy  . Rectal prolapse repair  12/05/11  . Abdominal hysterectomy    . Cardiac catheterization    . Tubal ligation    . Shoulder arthroscopy with subacromial decompression, rotator cuff repair and bicep tendon repair Left 05/25/2015    Procedure: SHOULDER ARTHROSCOPY WITH SUBACROMIAL DECOMPRESSION, ROTATOR CUFF REPAIR AND BICEP TENODESIS, DEBRIDEMENT. ;  Surgeon: Meredith Pel, MD;  Location: [facility];  Service: Orthopedics;  Laterality: Left;  LEFT SHOULDER ROTATOR CUFF TEAR REPAIR, ARTHROSCOPY, DEBRIDEMENT, BICEPS TENODESIS, SUBACROMIAL DECOMPRESSION.    FAMILY  HISTORY: Family History  Problem Relation Age of Onset  . Heart disease Mother   . Throat cancer Father   .  Cancer Father     stomach  . Kidney cancer Brother   . Heart disease Brother     SOCIAL HISTORY:  Social History   Social History  . Marital Status: Married    Spouse Name: [name]  . Number of Children: 4  . Years of Education: 14   Occupational History  . retired. prev worked at [employer].    Social History Main Topics  . Smoking status: Former Smoker    Quit date: 11/13/1980  . Smokeless tobacco: Never Used     Comment: quit in 1982  . Alcohol Use: No  . Drug Use: No  . Sexual Activity: Not on file   Other Topics Concern  . Not on file   Social History Narrative   21 st January 2014 , patient underwent PS and MSLT - MSLT had one  SREMs and an average time to fall asleep of  *.8 minutes , her Ewort is 20 points,: facit: this patient has severe hypersomnia and is at risk when driving. medication in form ogf nuvigil smaples had been dispensed to her but she ha not yet taken it. 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 In detail -discussion of diagnosis and treatment  takes place today , first with nuvigil and if insufficient, with XYREM. Her CPAP treats her OSA very well, and she uses it 6 hours  or more each night,  residual AHI of 1.1  would not allow for OSA to be still explaining this degree of sleepiness. 2 downloads were reviewed. labs reviewed.    Patient is married [name]) and lives at home with her husband and grandchild.   Patient has four children   Patient is retired.   Patient has a college education.   Patient is right-handed.   Patient does not drink any caffeine.           PHYSICAL EXAM   Filed Vitals:   12/28/15 1015  BP: 130/70  Pulse: 76  Resp: 20  Height: 4\' 11"  (1.499 m)  Weight: 124 lb (56.246 kg)    Not recorded      Body mass index is 25.03 kg/(m^2).   Generalized: In no  acute distress, well groomed and nourished.   Neck: Supple, no carotid bruits , circumference 13 inches.   Cardiac: Regular rate rhythm, no bruit . No carotid bruit. Patient reports right ear pulsating tinnitus.   Pulmonary: Clear to auscultation bilaterally,   Musculoskeletal: No deformity.   Neurological examination    ASSESSMENT AND PLAN   She is to transfer her CPAP care to the [facility]. .  She continues to see Dr. Krista Blue for non sleep related issues.    Rv in 12 month with Dr Krista Blue  Nurse practitioner to see patient.  No further sleep medicine care at [place] , M.D.   [facility] [address], [place], [place] [address] [phone]  Cc: Dr Colbert Coyer , [facility].

## 2015-12-28 NOTE — Patient Instructions (Signed)
Patient's CPAP memory chip is replaced 6 month ago and has no DATA, because she didn't use the CPAP. This is medical non compliance. I will ask her PCP if he considers to take her sleep care over within the [facility].   No longer followed at [facility] sleep, due to non compliance.

## 2016-01-24 ENCOUNTER — Other Ambulatory Visit: Payer: Self-pay

## 2016-01-24 DIAGNOSIS — Z1231 Encounter for screening mammogram for malignant neoplasm of breast: Secondary | ICD-10-CM

## 2016-01-27 DIAGNOSIS — G3184 Mild cognitive impairment, so stated: Secondary | ICD-10-CM | POA: Diagnosis not present

## 2016-02-29 ENCOUNTER — Ambulatory Visit: Payer: Medicare Other

## 2016-03-01 ENCOUNTER — Ambulatory Visit: Payer: Medicare Other

## 2016-03-13 ENCOUNTER — Ambulatory Visit
Admission: RE | Admit: 2016-03-13 | Discharge: 2016-03-13 | Disposition: A | Discharge status: Home / Self Care | Payer: Medicare Other | Source: Ambulatory Visit

## 2016-03-13 DIAGNOSIS — Z1231 Encounter for screening mammogram for malignant neoplasm of breast: Secondary | ICD-10-CM

## 2016-06-12 DIAGNOSIS — I8002 Phlebitis and thrombophlebitis of superficial vessels of left lower extremity: Secondary | ICD-10-CM | POA: Diagnosis not present

## 2016-07-04 DIAGNOSIS — I10 Essential (primary) hypertension: Secondary | ICD-10-CM | POA: Diagnosis not present

## 2016-07-04 DIAGNOSIS — M545 Low back pain: Secondary | ICD-10-CM | POA: Diagnosis not present

## 2016-07-04 DIAGNOSIS — Z7984 Long term (current) use of oral hypoglycemic drugs: Secondary | ICD-10-CM | POA: Diagnosis not present

## 2016-07-04 DIAGNOSIS — Z23 Encounter for immunization: Secondary | ICD-10-CM | POA: Diagnosis not present

## 2016-07-04 DIAGNOSIS — E7800 Pure hypercholesterolemia, unspecified: Secondary | ICD-10-CM | POA: Diagnosis not present

## 2016-07-04 DIAGNOSIS — E119 Type 2 diabetes mellitus without complications: Secondary | ICD-10-CM | POA: Diagnosis not present

## 2016-07-04 DIAGNOSIS — D509 Iron deficiency anemia, unspecified: Secondary | ICD-10-CM | POA: Diagnosis not present

## 2016-07-04 DIAGNOSIS — Z0000 Encounter for general adult medical examination without abnormal findings: Secondary | ICD-10-CM | POA: Diagnosis not present

## 2016-07-04 DIAGNOSIS — N390 Urinary tract infection, site not specified: Secondary | ICD-10-CM | POA: Diagnosis not present

## 2016-07-04 DIAGNOSIS — G5600 Carpal tunnel syndrome, unspecified upper limb: Secondary | ICD-10-CM | POA: Diagnosis not present

## 2016-07-04 DIAGNOSIS — Z1389 Encounter for screening for other disorder: Secondary | ICD-10-CM | POA: Diagnosis not present

## 2016-07-26 DIAGNOSIS — Z780 Asymptomatic menopausal state: Secondary | ICD-10-CM | POA: Diagnosis not present

## 2016-08-02 ENCOUNTER — Emergency Department (HOSPITAL_COMMUNITY): Payer: Medicare Other

## 2016-08-02 ENCOUNTER — Encounter (HOSPITAL_COMMUNITY): Payer: Self-pay | Admitting: *Deleted

## 2016-08-02 ENCOUNTER — Emergency Department (HOSPITAL_COMMUNITY)
Admission: EM | Admit: 2016-08-02 | Discharge: 2016-08-02 | Disposition: A | Discharge status: Home / Self Care | Payer: Medicare Other | Attending: Emergency Medicine | Admitting: Emergency Medicine

## 2016-08-02 DIAGNOSIS — Z79899 Other long term (current) drug therapy: Secondary | ICD-10-CM | POA: Insufficient documentation

## 2016-08-02 DIAGNOSIS — Z7982 Long term (current) use of aspirin: Secondary | ICD-10-CM | POA: Insufficient documentation

## 2016-08-02 DIAGNOSIS — I10 Essential (primary) hypertension: Secondary | ICD-10-CM | POA: Diagnosis not present

## 2016-08-02 DIAGNOSIS — E119 Type 2 diabetes mellitus without complications: Secondary | ICD-10-CM | POA: Diagnosis not present

## 2016-08-02 DIAGNOSIS — Z87891 Personal history of nicotine dependence: Secondary | ICD-10-CM | POA: Diagnosis not present

## 2016-08-02 DIAGNOSIS — R002 Palpitations: Secondary | ICD-10-CM

## 2016-08-02 DIAGNOSIS — R079 Chest pain, unspecified: Secondary | ICD-10-CM | POA: Diagnosis present

## 2016-08-02 DIAGNOSIS — Z7984 Long term (current) use of oral hypoglycemic drugs: Secondary | ICD-10-CM | POA: Diagnosis not present

## 2016-08-02 LAB — CBC
HCT: 34.5 % — ABNORMAL LOW (ref 36.0–46.0)
Hemoglobin: 11.7 g/dL — ABNORMAL LOW (ref 12.0–15.0)
MCH: 29.2 pg (ref 26.0–34.0)
MCHC: 33.9 g/dL (ref 30.0–36.0)
MCV: 86 fL (ref 78.0–100.0)
Platelets: 234 10*3/uL (ref 150–400)
RBC: 4.01 MIL/uL (ref 3.87–5.11)
RDW: 13 % (ref 11.5–15.5)
WBC: 5.4 10*3/uL (ref 4.0–10.5)

## 2016-08-02 LAB — BASIC METABOLIC PANEL
Anion gap: 8 (ref 5–15)
BUN: 14 mg/dL (ref 6–20)
CO2: 25 mmol/L (ref 22–32)
Calcium: 10 mg/dL (ref 8.9–10.3)
Chloride: 109 mmol/L (ref 101–111)
Creatinine, Ser: 1.17 mg/dL — ABNORMAL HIGH (ref 0.44–1.00)
GFR calc Af Amer: 51 mL/min — ABNORMAL LOW (ref >60)
GFR, EST NON AFRICAN AMERICAN: 44 mL/min — AB (ref >60)
GLUCOSE: 106 mg/dL — AB (ref 65–99)
POTASSIUM: 5.1 mmol/L (ref 3.5–5.1)
Sodium: 142 mmol/L (ref 135–145)

## 2016-08-02 LAB — I-STAT TROPONIN, ED: Troponin i, poc: 0.01 ng/mL (ref 0.00–0.08)

## 2016-08-02 NOTE — ED Provider Notes (Signed)
[facility] DEPT Provider Note   CSN: [id number] Arrival date & time: 08/02/16  1655     History   Chief Complaint Chief Complaint  Patient presents with  . Palpitations  . Chest Pain    HPI Stephanie Barber is a 75 y.o. female.  HPI Patient has been getting intermittent palpitations for several weeks. She reports she feels of skipping or beating that is fast. Sometimes it may last for several minutes up to maybe 20 minutes. She reports when it happens it does feel uncomfortable. She has not had any syncope or near syncope. No associated diaphoresis, nausea or shortness of breath. Patient reports the symptoms have increased in the past several days. Her husband adds that she has been working long hours and getting little rest for the past several days as well. Patient denies significant caffeine intake. She reports she drinks an organic coffee-like product that is not caffeinated. The patient reports that aside from these episodes of palpitations, she has felt well. Past Medical History:  Diagnosis Date  . Arthritis   . Chronic headache   . Complication of anesthesia    allergy to Novocaine   . Diabetes mellitus   . GERD (gastroesophageal reflux disease)   . Heart murmur   . High cholesterol   . Hyperlipidemia   . Hypertension   . Intracranial atherosclerosis    per MRA  . Migraine   . Narcolepsy and cataplexy   . OSA on CPAP    residual AHI of-1.1, CPAPevery night   . Rectal prolapse     Patient Active Problem List   Diagnosis Date Noted  . Noncompliance with CPAP treatment 12/28/2015  . Poor compliance with CPAP treatment 10/26/2014  . CPAP use counseling 07/28/2014  . OSA on CPAP 07/28/2014  . 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 Hypersomnolent 04/02/2014  . Obstructive sleep apnea 04/02/2014  . Non-compliance with treatment 03/05/2014  . Rectal irritation 01/23/2012  . Diabetes mellitus 12/05/2011  . Hypertension 12/05/2011  . GERD  (gastroesophageal reflux disease)   . 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 Chronic cough 07/26/2011    Past Surgical History:  Procedure Laterality Date  . ABDOMINAL HYSTERECTOMY    . BACK SURGERY     L4,L5 discectomy  . CARDIAC CATHETERIZATION    . KIDNEY SURGERY  1998   Right.  growth removed  . rectal prolapse repair  12/05/11  . ROTATOR CUFF REPAIR     Left  . SHOULDER ARTHROSCOPY WITH SUBACROMIAL DECOMPRESSION, ROTATOR CUFF REPAIR AND BICEP TENDON REPAIR Left 05/25/2015   Procedure: SHOULDER ARTHROSCOPY WITH SUBACROMIAL DECOMPRESSION, ROTATOR CUFF REPAIR AND BICEP TENODESIS, DEBRIDEMENT. ;  Surgeon: Meredith Pel, MD;  Location: [facility];  Service: Orthopedics;  Laterality: Left;  LEFT SHOULDER ROTATOR CUFF TEAR REPAIR, ARTHROSCOPY, DEBRIDEMENT, BICEPS TENODESIS, SUBACROMIAL DECOMPRESSION.  . TOTAL ABDOMINAL HYSTERECTOMY    . TUBAL LIGATION      OB History    No data available       Home Medications    Prior to Admission medications   Medication Sig Start Date End Date Taking? Authorizing Provider  amLODipine (NORVASC) 10 MG tablet Take 10 mg by mouth daily after breakfast.     Historical Provider, MD  aspirin 81 MG tablet Take 81 mg by mouth at bedtime.     Historical Provider, MD  BIOTIN PO Take 50 mg by mouth 2 (two) times daily.  Historical Provider, MD  celecoxib (CELEBREX) 200 MG capsule Take 200 mg by mouth daily.     Historical Provider, MD  fish oil-omega-3 fatty acids 1000 MG capsule Take 1 g by mouth daily.      Historical Provider, MD  gabapentin (NEURONTIN) 400 MG capsule Take 400 mg by mouth at bedtime.     Historical Provider, MD  losartan (COZAAR) 100 MG tablet Take 100 mg by mouth daily after breakfast.     Historical Provider, MD  metFORMIN (GLUCOPHAGE) 500 MG tablet Take 500 mg by mouth 2 (two) times daily with a meal.     Historical Provider, MD    methocarbamol (ROBAXIN) 500 MG tablet Take 1 tablet (500 mg total) by mouth 4 (four) times daily. 05/25/15   Meredith Pel, MD  metoprolol (TOPROL-XL) 50 MG 24 hr tablet Take 50 mg by mouth daily after breakfast.     Historical Provider, MD  oxyCODONE-acetaminophen (ROXICET) 5-325 MG per tablet Take 1-2 tablets by mouth every 6 (six) hours as needed for severe pain. 05/25/15   Meredith Pel, MD  Polyethyl Glycol-Propyl Glycol (SYSTANE ULTRA) 0.4-0.3 % SOLN Place 1 drop into both eyes daily.      Historical Provider, MD  polysaccharide iron (NIFEREX) 150 MG CAPS capsule Take 150 mg by mouth daily.     Historical Provider, MD  ranitidine (ZANTAC) 300 MG capsule Take 300 mg by mouth 2 (two) times daily.     Historical Provider, MD    Family History Family History  Problem Relation Age of Onset  . Heart disease Mother   . Throat cancer Father   . Cancer Father     stomach  . Kidney cancer Brother   . Heart disease Brother     Social History Social History  Substance Use Topics  . Smoking status: Former Smoker    Quit date: 11/13/1980  . Smokeless tobacco: Never Used     Comment: quit in 1982  . Alcohol use No     Allergies   Novocain [procaine hcl]; Hydrocodone; and Morphine and related   Review of Systems Review of Systems 10 Systems reviewed and are negative for acute change except as noted in the HPI.   Physical Exam Updated Vital Signs BP 164/63 (BP Location: Left Arm)   Pulse (!) 55   Temp 98.1 F (36.7 C) (Oral)   Resp 18   Ht 4\' 11"  (1.499 m)   Wt 128 lb (58.1 kg)   SpO2 100%   BMI 25.85 kg/m   Physical Exam  Constitutional: She appears well-developed and well-nourished. No distress.  HENT:  Head: Normocephalic and atraumatic.  Mouth/Throat: Oropharynx is clear and moist.  Eyes: Conjunctivae are normal.  Neck: Neck supple.  Cardiovascular: Normal rate and regular rhythm.   No murmur heard. Pulmonary/Chest: Effort normal and breath sounds normal.  No respiratory distress.  Abdominal: Soft. There is no tenderness.  Musculoskeletal: She exhibits no edema or tenderness.  Neurological: She is alert.  Skin: Skin is warm and dry.  Psychiatric: She has a normal mood and affect.  Nursing note and vitals reviewed.    ED Treatments / Results  Labs (all labs ordered are listed, but only abnormal results are displayed) Labs Reviewed  BASIC METABOLIC PANEL - Abnormal; Notable for the following:       Result Value   Glucose, Bld 106 (*)    Creatinine, Ser 1.17 (*)    GFR calc non Af Amer 44 (*)  GFR calc Af Amer 51 (*)    All other components within normal limits  CBC - Abnormal; Notable for the following:    Hemoglobin 11.7 (*)    HCT 34.5 (*)    All other components within normal limits  I-STAT TROPOININ, ED    EKG  EKG Interpretation  Date/Time:  Wednesday August 02 2016 17:19:25 EDT Ventricular Rate:  57 PR Interval:    QRS Duration: 80 QT Interval:  400 QTC Calculation: 390 R Axis:   38 Text Interpretation:  Sinus rhythm Low voltage, precordial leads agree. no change from previous Confirmed by Johnney Killian, MD, [place] [phone]) on 08/02/2016 8:05:48 PM       Radiology Dg Chest 2 View  Result Date: 08/02/2016 CLINICAL DATA:  Chest pain EXAM: CHEST  2 VIEW COMPARISON:  05/20/2009 chest radiograph. FINDINGS: Stable cardiomediastinal silhouette with normal heart size. No pneumothorax. No pleural effusion. Lungs appear clear, with no acute consolidative airspace disease and no pulmonary edema. Surgical clip is again seen in the left upper quadrant of the abdomen. IMPRESSION: No active cardiopulmonary disease. Electronically Signed   By: Ilona Sorrel M.D.   On: 08/02/2016 17:53    Procedures Procedures (including critical care time)  Medications Ordered in ED Medications - No data to display   Initial Impression / Assessment and Plan / ED Course  I have reviewed the triage vital signs and the nursing notes.  Pertinent  labs & imaging results that were available during my care of the patient were reviewed by me and considered in my medical decision making (see chart for details).  Clinical Course    Final Clinical Impressions(s) / ED Diagnoses   Final diagnoses:  Palpitations   Patient has episodic palpitations over the past 2 weeks. I have observed the monitor throughout our encounter and the patient has been monitored. There have been no episodes of dysrhythmia or PVC/ PAC. Patient is not experiencing syncope near syncope or ischemic type symptoms in conjunction with these. At this time I feel she is safe to continue outpatient evaluation. She is instructed to call her family doctor and cardiology this week to set up appointments. She is counseled to return should symptoms persist, increase or worsen. New Prescriptions New Prescriptions   No medications on file     Charlesetta Shanks, MD 08/02/16 2031

## 2016-08-02 NOTE — ED Triage Notes (Signed)
Pt reports palpitations which started intermittently x 2 weeks ago while pt's husband was in the hospital.  States it's more often now and she also started to have L cp.  Non-radiating.  Denies any SOB or dizziness at this time.

## 2016-08-02 NOTE — Discharge Instructions (Signed)
Call your family doctor tomorrow to schedule a recheck within the next one to 2 days. Call a cardiology group listed above to schedule an appointment as well.

## 2016-08-04 DIAGNOSIS — R002 Palpitations: Secondary | ICD-10-CM | POA: Diagnosis not present

## 2016-08-16 DIAGNOSIS — R002 Palpitations: Secondary | ICD-10-CM | POA: Diagnosis not present

## 2016-08-16 DIAGNOSIS — K219 Gastro-esophageal reflux disease without esophagitis: Secondary | ICD-10-CM | POA: Diagnosis not present

## 2016-08-16 DIAGNOSIS — I10 Essential (primary) hypertension: Secondary | ICD-10-CM | POA: Diagnosis not present

## 2016-08-16 DIAGNOSIS — R5383 Other fatigue: Secondary | ICD-10-CM | POA: Diagnosis not present

## 2016-08-16 DIAGNOSIS — R0789 Other chest pain: Secondary | ICD-10-CM | POA: Diagnosis not present

## 2016-08-16 DIAGNOSIS — G4733 Obstructive sleep apnea (adult) (pediatric): Secondary | ICD-10-CM | POA: Diagnosis not present

## 2016-08-16 DIAGNOSIS — R079 Chest pain, unspecified: Secondary | ICD-10-CM | POA: Diagnosis not present

## 2016-08-24 DIAGNOSIS — R002 Palpitations: Secondary | ICD-10-CM | POA: Diagnosis not present

## 2016-08-24 DIAGNOSIS — K219 Gastro-esophageal reflux disease without esophagitis: Secondary | ICD-10-CM | POA: Diagnosis not present

## 2016-08-24 DIAGNOSIS — I10 Essential (primary) hypertension: Secondary | ICD-10-CM | POA: Diagnosis not present

## 2016-08-24 DIAGNOSIS — R0789 Other chest pain: Secondary | ICD-10-CM | POA: Diagnosis not present

## 2016-08-24 DIAGNOSIS — E785 Hyperlipidemia, unspecified: Secondary | ICD-10-CM | POA: Diagnosis not present

## 2016-08-24 DIAGNOSIS — R5383 Other fatigue: Secondary | ICD-10-CM | POA: Diagnosis not present

## 2016-08-24 DIAGNOSIS — G4733 Obstructive sleep apnea (adult) (pediatric): Secondary | ICD-10-CM | POA: Diagnosis not present

## 2016-08-24 DIAGNOSIS — R079 Chest pain, unspecified: Secondary | ICD-10-CM | POA: Diagnosis not present

## 2016-09-04 DIAGNOSIS — R008 Other abnormalities of heart beat: Secondary | ICD-10-CM | POA: Diagnosis not present

## 2016-10-12 DIAGNOSIS — Z7984 Long term (current) use of oral hypoglycemic drugs: Secondary | ICD-10-CM | POA: Diagnosis not present

## 2016-10-12 DIAGNOSIS — M79601 Pain in right arm: Secondary | ICD-10-CM | POA: Diagnosis not present

## 2016-10-12 DIAGNOSIS — E119 Type 2 diabetes mellitus without complications: Secondary | ICD-10-CM | POA: Diagnosis not present

## 2016-10-25 ENCOUNTER — Other Ambulatory Visit: Payer: Self-pay | Admitting: Family Medicine

## 2016-10-25 DIAGNOSIS — R202 Paresthesia of skin: Secondary | ICD-10-CM

## 2016-11-15 ENCOUNTER — Ambulatory Visit
Admission: RE | Admit: 2016-11-15 | Discharge: 2016-11-15 | Disposition: A | Discharge status: Home / Self Care | Payer: Medicare Other | Source: Ambulatory Visit | Attending: Family Medicine | Admitting: Family Medicine

## 2016-11-15 DIAGNOSIS — R202 Paresthesia of skin: Secondary | ICD-10-CM

## 2016-11-15 DIAGNOSIS — M50222 Other cervical disc displacement at C5-C6 level: Secondary | ICD-10-CM | POA: Diagnosis not present

## 2016-11-28 DIAGNOSIS — G5601 Carpal tunnel syndrome, right upper limb: Secondary | ICD-10-CM | POA: Diagnosis not present

## 2016-11-28 DIAGNOSIS — G5602 Carpal tunnel syndrome, left upper limb: Secondary | ICD-10-CM | POA: Diagnosis not present

## 2016-11-28 DIAGNOSIS — M4722 Other spondylosis with radiculopathy, cervical region: Secondary | ICD-10-CM | POA: Diagnosis not present

## 2016-12-11 DIAGNOSIS — G5603 Carpal tunnel syndrome, bilateral upper limbs: Secondary | ICD-10-CM | POA: Diagnosis not present

## 2016-12-11 DIAGNOSIS — M542 Cervicalgia: Secondary | ICD-10-CM | POA: Diagnosis not present

## 2016-12-26 DIAGNOSIS — G5601 Carpal tunnel syndrome, right upper limb: Secondary | ICD-10-CM | POA: Diagnosis not present

## 2016-12-26 DIAGNOSIS — Z6826 Body mass index (BMI) 26.0-26.9, adult: Secondary | ICD-10-CM | POA: Diagnosis not present

## 2016-12-26 DIAGNOSIS — I10 Essential (primary) hypertension: Secondary | ICD-10-CM | POA: Diagnosis not present

## 2017-01-04 DIAGNOSIS — M545 Low back pain: Secondary | ICD-10-CM | POA: Diagnosis not present

## 2017-01-04 DIAGNOSIS — E782 Mixed hyperlipidemia: Secondary | ICD-10-CM | POA: Diagnosis not present

## 2017-01-04 DIAGNOSIS — Z7984 Long term (current) use of oral hypoglycemic drugs: Secondary | ICD-10-CM | POA: Diagnosis not present

## 2017-01-04 DIAGNOSIS — D509 Iron deficiency anemia, unspecified: Secondary | ICD-10-CM | POA: Diagnosis not present

## 2017-01-04 DIAGNOSIS — I10 Essential (primary) hypertension: Secondary | ICD-10-CM | POA: Diagnosis not present

## 2017-01-04 DIAGNOSIS — E119 Type 2 diabetes mellitus without complications: Secondary | ICD-10-CM | POA: Diagnosis not present

## 2017-01-04 DIAGNOSIS — G5600 Carpal tunnel syndrome, unspecified upper limb: Secondary | ICD-10-CM | POA: Diagnosis not present

## 2017-01-04 DIAGNOSIS — K219 Gastro-esophageal reflux disease without esophagitis: Secondary | ICD-10-CM | POA: Diagnosis not present

## 2017-01-09 DIAGNOSIS — Z86010 Personal history of colonic polyps: Secondary | ICD-10-CM | POA: Diagnosis not present

## 2017-01-09 DIAGNOSIS — D126 Benign neoplasm of colon, unspecified: Secondary | ICD-10-CM | POA: Diagnosis not present

## 2017-01-11 DIAGNOSIS — D126 Benign neoplasm of colon, unspecified: Secondary | ICD-10-CM | POA: Diagnosis not present

## 2017-01-18 DIAGNOSIS — G5601 Carpal tunnel syndrome, right upper limb: Secondary | ICD-10-CM | POA: Diagnosis not present

## 2017-01-18 HISTORY — PX: CARPAL TUNNEL RELEASE: SHX101

## 2017-01-30 ENCOUNTER — Inpatient Hospital Stay (HOSPITAL_COMMUNITY)
Admission: RE | Admit: 2017-01-30 | Discharge: 2017-02-02 | DRG: 863 | Disposition: A | Discharge status: Home / Self Care | Payer: Medicare Other | Source: Ambulatory Visit | Attending: Neurosurgery | Admitting: Neurosurgery

## 2017-01-30 ENCOUNTER — Encounter (HOSPITAL_COMMUNITY): Payer: Self-pay | Admitting: General Practice

## 2017-01-30 DIAGNOSIS — Z87891 Personal history of nicotine dependence: Secondary | ICD-10-CM

## 2017-01-30 DIAGNOSIS — E119 Type 2 diabetes mellitus without complications: Secondary | ICD-10-CM | POA: Diagnosis present

## 2017-01-30 DIAGNOSIS — Z90710 Acquired absence of both cervix and uterus: Secondary | ICD-10-CM

## 2017-01-30 DIAGNOSIS — Z808 Family history of malignant neoplasm of other organs or systems: Secondary | ICD-10-CM

## 2017-01-30 DIAGNOSIS — K219 Gastro-esophageal reflux disease without esophagitis: Secondary | ICD-10-CM | POA: Diagnosis present

## 2017-01-30 DIAGNOSIS — Z79899 Other long term (current) drug therapy: Secondary | ICD-10-CM | POA: Diagnosis not present

## 2017-01-30 DIAGNOSIS — Z9851 Tubal ligation status: Secondary | ICD-10-CM | POA: Diagnosis not present

## 2017-01-30 DIAGNOSIS — G4733 Obstructive sleep apnea (adult) (pediatric): Secondary | ICD-10-CM | POA: Diagnosis present

## 2017-01-30 DIAGNOSIS — Z9889 Other specified postprocedural states: Secondary | ICD-10-CM | POA: Diagnosis not present

## 2017-01-30 DIAGNOSIS — I10 Essential (primary) hypertension: Secondary | ICD-10-CM | POA: Diagnosis present

## 2017-01-30 DIAGNOSIS — Z885 Allergy status to narcotic agent status: Secondary | ICD-10-CM | POA: Diagnosis not present

## 2017-01-30 DIAGNOSIS — Z8249 Family history of ischemic heart disease and other diseases of the circulatory system: Secondary | ICD-10-CM

## 2017-01-30 DIAGNOSIS — T814XXA Infection following a procedure, initial encounter: Principal | ICD-10-CM | POA: Diagnosis present

## 2017-01-30 DIAGNOSIS — Z8051 Family history of malignant neoplasm of kidney: Secondary | ICD-10-CM | POA: Diagnosis not present

## 2017-01-30 DIAGNOSIS — Z7982 Long term (current) use of aspirin: Secondary | ICD-10-CM | POA: Diagnosis not present

## 2017-01-30 DIAGNOSIS — T8149XA Infection following a procedure, other surgical site, initial encounter: Secondary | ICD-10-CM | POA: Diagnosis present

## 2017-01-30 DIAGNOSIS — Z7984 Long term (current) use of oral hypoglycemic drugs: Secondary | ICD-10-CM | POA: Diagnosis not present

## 2017-01-30 LAB — COMPREHENSIVE METABOLIC PANEL
ALBUMIN: 3.5 g/dL (ref 3.5–5.0)
ALT: 15 U/L (ref 14–54)
AST: 24 U/L (ref 15–41)
Alkaline Phosphatase: 56 U/L (ref 38–126)
Anion gap: 11 (ref 5–15)
BILIRUBIN TOTAL: 0.5 mg/dL (ref 0.3–1.2)
BUN: 14 mg/dL (ref 6–20)
CHLORIDE: 101 mmol/L (ref 101–111)
CO2: 30 mmol/L (ref 22–32)
Calcium: 9.7 mg/dL (ref 8.9–10.3)
Creatinine, Ser: 1.18 mg/dL — ABNORMAL HIGH (ref 0.44–1.00)
GFR calc Af Amer: 51 mL/min — ABNORMAL LOW (ref >60)
GFR calc non Af Amer: 44 mL/min — ABNORMAL LOW (ref >60)
GLUCOSE: 87 mg/dL (ref 65–99)
POTASSIUM: 3.8 mmol/L (ref 3.5–5.1)
SODIUM: 142 mmol/L (ref 135–145)
Total Protein: 7.1 g/dL (ref 6.5–8.1)

## 2017-01-30 LAB — CBC WITH DIFFERENTIAL/PLATELET
BASOS ABS: 0 10*3/uL (ref 0.0–0.1)
Basophils Relative: 0 %
EOS PCT: 3 %
Eosinophils Absolute: 0.2 10*3/uL (ref 0.0–0.7)
HEMATOCRIT: 34 % — AB (ref 36.0–46.0)
Hemoglobin: 11.2 g/dL — ABNORMAL LOW (ref 12.0–15.0)
LYMPHS ABS: 1.4 10*3/uL (ref 0.7–4.0)
LYMPHS PCT: 25 %
MCH: 28.5 pg (ref 26.0–34.0)
MCHC: 32.9 g/dL (ref 30.0–36.0)
MCV: 86.5 fL (ref 78.0–100.0)
MONO ABS: 0.6 10*3/uL (ref 0.1–1.0)
MONOS PCT: 11 %
Neutro Abs: 3.4 10*3/uL (ref 1.7–7.7)
Neutrophils Relative %: 61 %
PLATELETS: 300 10*3/uL (ref 150–400)
RBC: 3.93 MIL/uL (ref 3.87–5.11)
RDW: 13 % (ref 11.5–15.5)
WBC: 5.6 10*3/uL (ref 4.0–10.5)

## 2017-01-30 LAB — SEDIMENTATION RATE: Sed Rate: 78 mm/hr — ABNORMAL HIGH (ref 0–22)

## 2017-01-30 LAB — APTT: aPTT: 38 seconds — ABNORMAL HIGH (ref 24–36)

## 2017-01-30 LAB — PROTIME-INR
INR: 1.13
Prothrombin Time: 14.5 seconds (ref 11.4–15.2)

## 2017-01-30 MED ORDER — OXYCODONE HCL 5 MG PO TABS
5.0000 mg | ORAL_TABLET | ORAL | Status: DC | PRN
  Administered 2017-01-30 – 2017-02-01 (×2): 5 mg via ORAL
  Filled 2017-01-30 (×2): qty 1

## 2017-01-30 MED ORDER — METFORMIN HCL 500 MG PO TABS
500.0000 mg | ORAL_TABLET | Freq: Two times a day (BID) | ORAL | Status: DC
  Administered 2017-01-30 – 2017-02-02 (×6): 500 mg via ORAL
  Filled 2017-01-30 (×7): qty 1

## 2017-01-30 MED ORDER — BISACODYL 5 MG PO TBEC: 5.0000 mg | DELAYED_RELEASE_TABLET | Freq: Every day | ORAL | Status: DC | PRN

## 2017-01-30 MED ORDER — VANCOMYCIN HCL 10 G IV SOLR
1250.0000 mg | Freq: Once | INTRAVENOUS | Status: DC
  Filled 2017-01-30: qty 1250

## 2017-01-30 MED ORDER — SODIUM CHLORIDE 0.9% FLUSH
3.0000 mL | Freq: Two times a day (BID) | INTRAVENOUS | Status: DC
  Administered 2017-01-30 – 2017-02-01 (×3): 3 mL via INTRAVENOUS

## 2017-01-30 MED ORDER — VANCOMYCIN HCL 500 MG IV SOLR
500.0000 mg | Freq: Two times a day (BID) | INTRAVENOUS | Status: DC
  Administered 2017-01-30 – 2017-02-02 (×6): 500 mg via INTRAVENOUS
  Filled 2017-01-30 (×7): qty 500

## 2017-01-30 MED ORDER — LOSARTAN POTASSIUM 50 MG PO TABS
100.0000 mg | ORAL_TABLET | Freq: Every day | ORAL | Status: DC
Start: 2017-01-31 — End: 2017-02-02
  Administered 2017-01-31 – 2017-02-02 (×3): 100 mg via ORAL
  Filled 2017-01-30 (×3): qty 2

## 2017-01-30 MED ORDER — GABAPENTIN 400 MG PO CAPS
400.0000 mg | ORAL_CAPSULE | Freq: Every day | ORAL | Status: DC
  Administered 2017-01-30 – 2017-02-01 (×3): 400 mg via ORAL
  Filled 2017-01-30 (×3): qty 1

## 2017-01-30 MED ORDER — CELECOXIB 200 MG PO CAPS
200.0000 mg | ORAL_CAPSULE | Freq: Every day | ORAL | Status: DC
  Administered 2017-01-31 – 2017-02-02 (×3): 200 mg via ORAL
  Filled 2017-01-30 (×3): qty 1

## 2017-01-30 MED ORDER — POTASSIUM CHLORIDE IN NACL 20-0.9 MEQ/L-% IV SOLN
INTRAVENOUS | Status: DC
  Administered 2017-01-30 – 2017-02-01 (×7): via INTRAVENOUS
  Filled 2017-01-30 (×7): qty 1000

## 2017-01-30 MED ORDER — HEPARIN SODIUM (PORCINE) 5000 UNIT/ML IJ SOLN
5000.0000 [IU] | Freq: Three times a day (TID) | INTRAMUSCULAR | Status: DC
  Administered 2017-01-30 – 2017-02-02 (×9): 5000 [IU] via SUBCUTANEOUS
  Filled 2017-01-30 (×9): qty 1

## 2017-01-30 MED ORDER — ONDANSETRON HCL 4 MG PO TABS: 4.0000 mg | ORAL_TABLET | Freq: Four times a day (QID) | ORAL | Status: DC | PRN

## 2017-01-30 MED ORDER — ASPIRIN 81 MG PO CHEW
81.0000 mg | CHEWABLE_TABLET | Freq: Every day | ORAL | Status: DC
  Administered 2017-01-30 – 2017-02-01 (×3): 81 mg via ORAL
  Filled 2017-01-30 (×4): qty 1

## 2017-01-30 MED ORDER — SODIUM CHLORIDE 0.9% FLUSH: 3.0000 mL | INTRAVENOUS | Status: DC | PRN

## 2017-01-30 MED ORDER — METOPROLOL SUCCINATE ER 25 MG PO TB24
50.0000 mg | ORAL_TABLET | Freq: Every day | ORAL | Status: DC
  Administered 2017-01-31 – 2017-02-02 (×3): 50 mg via ORAL
  Filled 2017-01-30 (×3): qty 2

## 2017-01-30 MED ORDER — METHOCARBAMOL 500 MG PO TABS
500.0000 mg | ORAL_TABLET | Freq: Four times a day (QID) | ORAL | Status: DC
  Administered 2017-01-30 – 2017-02-02 (×11): 500 mg via ORAL
  Filled 2017-01-30 (×12): qty 1

## 2017-01-30 MED ORDER — AMLODIPINE BESYLATE 10 MG PO TABS
10.0000 mg | ORAL_TABLET | Freq: Every day | ORAL | Status: DC
  Administered 2017-01-31 – 2017-02-02 (×3): 10 mg via ORAL
  Filled 2017-01-30 (×3): qty 1

## 2017-01-30 MED ORDER — VANCOMYCIN HCL IN DEXTROSE 1-5 GM/200ML-% IV SOLN
1000.0000 mg | Freq: Once | INTRAVENOUS | Status: AC
  Administered 2017-01-30: 1000 mg via INTRAVENOUS
  Filled 2017-01-30: qty 200

## 2017-01-30 MED ORDER — DOCUSATE SODIUM 100 MG PO CAPS
100.0000 mg | ORAL_CAPSULE | Freq: Two times a day (BID) | ORAL | Status: DC
  Administered 2017-01-30 – 2017-02-02 (×5): 100 mg via ORAL
  Filled 2017-01-30 (×5): qty 1

## 2017-01-30 MED ORDER — ACETAMINOPHEN 325 MG PO TABS
650.0000 mg | ORAL_TABLET | Freq: Four times a day (QID) | ORAL | Status: DC | PRN
  Administered 2017-01-31: 650 mg via ORAL
  Filled 2017-01-30: qty 2

## 2017-01-30 MED ORDER — ONDANSETRON HCL 4 MG/2ML IJ SOLN
4.0000 mg | Freq: Four times a day (QID) | INTRAMUSCULAR | Status: DC | PRN
  Administered 2017-01-30: 4 mg via INTRAVENOUS
  Filled 2017-01-30: qty 2

## 2017-01-30 MED ORDER — SODIUM CHLORIDE 0.9 % IV SOLN: 250.0000 mL | INTRAVENOUS | Status: DC | PRN

## 2017-01-30 MED ORDER — POLYSACCHARIDE IRON COMPLEX 150 MG PO CAPS
150.0000 mg | ORAL_CAPSULE | Freq: Every day | ORAL | Status: DC
  Administered 2017-01-30 – 2017-02-02 (×3): 150 mg via ORAL
  Filled 2017-01-30 (×4): qty 1

## 2017-01-30 MED ORDER — OXYCODONE-ACETAMINOPHEN 5-325 MG PO TABS
1.0000 | ORAL_TABLET | Freq: Four times a day (QID) | ORAL | Status: DC | PRN
  Administered 2017-01-30: 2 via ORAL
  Administered 2017-01-31 (×2): 1 via ORAL
  Administered 2017-01-31 – 2017-02-01 (×3): 2 via ORAL
  Filled 2017-01-30 (×3): qty 2
  Filled 2017-01-30: qty 1
  Filled 2017-01-30 (×2): qty 2

## 2017-01-30 MED ORDER — FAMOTIDINE 20 MG PO TABS
20.0000 mg | ORAL_TABLET | Freq: Two times a day (BID) | ORAL | Status: DC
  Administered 2017-01-30 – 2017-02-02 (×5): 20 mg via ORAL
  Filled 2017-01-30 (×5): qty 1

## 2017-01-30 MED ORDER — SENNOSIDES-DOCUSATE SODIUM 8.6-50 MG PO TABS: 1.0000 | ORAL_TABLET | Freq: Every evening | ORAL | Status: DC | PRN

## 2017-01-30 MED ORDER — ACETAMINOPHEN 650 MG RE SUPP: 650.0000 mg | Freq: Four times a day (QID) | RECTAL | Status: DC | PRN

## 2017-01-30 NOTE — Progress Notes (Addendum)
Pharmacy Antibiotic Note  Stephanie Barber is a 76 y.o. female admitted on 01/30/2017 with wound infection. Pharmacy has been consulted for vancomycin dosing.  Plan: Vancomycin 1000mg  IV once then 500mg  IV every 12 hours.  Goal trough 15-20 mcg/mL. F/U cultures  F/U length of therapy Monitor Renal function    Temp (24hrs), Avg:98.2 F (36.8 C), Min:98.2 F (36.8 C), Max:98.2 F (36.8 C)  No results for input(s): WBC, CREATININE, LATICACIDVEN, VANCOTROUGH, VANCOPEAK, VANCORANDOM, GENTTROUGH, GENTPEAK, GENTRANDOM, TOBRATROUGH, TOBRAPEAK, TOBRARND, AMIKACINPEAK, AMIKACINTROU, AMIKACIN in the last 168 hours.  CrCl cannot be calculated (Patient's most recent lab result is older than the maximum 21 days allowed.).    Allergies  Allergen Reactions  . Morphine And Related Anaphylaxis    Unknown.    Armanda Heritage [Procaine Hcl] Anaphylaxis  . Hydrocodone Diarrhea and Nausea And Vomiting   Thank you for allowing pharmacy to be a part of this patient's care.  Georga Bora, PharmD Clinical Pharmacist Pager: [phone] 01/30/2017 2:01 PM

## 2017-01-30 NOTE — H&P (Signed)
Stephanie Barber is an 76 y.o. female.   Chief Complaint: wound infection right hand HPI: whom underwent a right carpal tunnel release on, post op she has had pain, and drainage from the wound. It was opened last week Thursday in our office. I had placed her on antibiotics last week Monday giving her Keflex. Dr. Elsner switched her to ciprofloxacin. She returned to the office yesterday and I felt she should be admitted for IV antibiotics. The hand is swollen and tender. She is afebrile.  Past Medical History:  Diagnosis Date  . Arthritis   . Chronic headache   . Complication of anesthesia    allergy to Novocaine   . Diabetes mellitus   . GERD (gastroesophageal reflux disease)   . Heart murmur   . High cholesterol   . Hyperlipidemia   . Hypertension   . Intracranial atherosclerosis    per MRA  . Migraine   . Narcolepsy and cataplexy   . OSA on CPAP    residual AHI of-1.1, CPAPevery night   . Rectal prolapse     Past Surgical History:  Procedure Laterality Date  . ABDOMINAL HYSTERECTOMY    . BACK SURGERY     L4,L5 discectomy  . CARDIAC CATHETERIZATION    . CARPAL TUNNEL RELEASE Right 01/18/2017  . KIDNEY SURGERY  1998   Right.  growth removed  . rectal prolapse repair  12/05/11  . ROTATOR CUFF REPAIR     Left  . SHOULDER ARTHROSCOPY WITH SUBACROMIAL DECOMPRESSION, ROTATOR CUFF REPAIR AND BICEP TENDON REPAIR Left 05/25/2015   Procedure: SHOULDER ARTHROSCOPY WITH SUBACROMIAL DECOMPRESSION, ROTATOR CUFF REPAIR AND BICEP TENODESIS, DEBRIDEMENT. ;  Surgeon: Scott Gregory Dean, MD;  Location: [facility] OR;  Service: Orthopedics;  Laterality: Left;  LEFT SHOULDER ROTATOR CUFF TEAR REPAIR, ARTHROSCOPY, DEBRIDEMENT, BICEPS TENODESIS, SUBACROMIAL DECOMPRESSION.  . TOTAL ABDOMINAL HYSTERECTOMY    . TUBAL LIGATION      Family History  Problem Relation Age of Onset  . Heart disease Mother   . Throat cancer Father   . Cancer Father     stomach  . Kidney cancer Brother   . Heart disease  Brother    Social History:  reports that she quit smoking about 36 years ago. She has never used smokeless tobacco. She reports that she does not drink alcohol or use drugs.  Allergies:  Allergies  Allergen Reactions  . Morphine And Related Anaphylaxis    Unknown.    . Novocain [Procaine Hcl] Anaphylaxis  . Hydrocodone Diarrhea and Nausea And Vomiting    Medications Prior to Admission  Medication Sig Dispense Refill  . acetaminophen-codeine (TYLENOL #3) 300-30 MG tablet Take 1 tablet by mouth every 6 (six) hours as needed. for pain  0  . amLODipine (NORVASC) 10 MG tablet Take 10 mg by mouth daily after breakfast.     . aspirin 81 MG tablet Take 81 mg by mouth at bedtime.     . celecoxib (CELEBREX) 200 MG capsule Take 200 mg by mouth daily.     . doxycycline (VIBRA-TABS) 100 MG tablet Take 100 mg by mouth 2 (two) times daily.    . fish oil-omega-3 fatty acids 1000 MG capsule Take 1 g by mouth daily.      . gabapentin (NEURONTIN) 400 MG capsule Take 400 mg by mouth at bedtime.     . losartan (COZAAR) 100 MG tablet Take 100 mg by mouth daily after breakfast.     . metFORMIN (GLUCOPHAGE) 500 MG tablet Take   500 mg by mouth 2 (two) times daily with a meal.     . methocarbamol (ROBAXIN) 500 MG tablet Take 1 tablet (500 mg total) by mouth 4 (four) times daily. 30 tablet 0  . metoprolol (TOPROL-XL) 50 MG 24 hr tablet Take 50 mg by mouth daily after breakfast.     . oxyCODONE-acetaminophen (ROXICET) 5-325 MG per tablet Take 1-2 tablets by mouth every 6 (six) hours as needed for severe pain. 60 tablet 0  . Polyethyl Glycol-Propyl Glycol (SYSTANE ULTRA) 0.4-0.3 % SOLN Place 1 drop into both eyes daily.      . polysaccharide iron (NIFEREX) 150 MG CAPS capsule Take 150 mg by mouth daily.     . ranitidine (ZANTAC) 300 MG capsule Take 300 mg by mouth 2 (two) times daily.       Results for orders placed or performed during the hospital encounter of 01/30/17 (from the past 48 hour(s))  Comprehensive  metabolic panel     Status: Abnormal   Collection Time: 01/30/17  1:36 PM  Result Value Ref Range   Sodium 142 135 - 145 mmol/L   Potassium 3.8 3.5 - 5.1 mmol/L   Chloride 101 101 - 111 mmol/L   CO2 30 22 - 32 mmol/L   Glucose, Bld 87 65 - 99 mg/dL   BUN 14 6 - 20 mg/dL   Creatinine, Ser 1.18 (H) 0.44 - 1.00 mg/dL   Calcium 9.7 8.9 - 10.3 mg/dL   Total Protein 7.1 6.5 - 8.1 g/dL   Albumin 3.5 3.5 - 5.0 g/dL   AST 24 15 - 41 U/L   ALT 15 14 - 54 U/L   Alkaline Phosphatase 56 38 - 126 U/L   Total Bilirubin 0.5 0.3 - 1.2 mg/dL   GFR calc non Af Amer 44 (L) >60 mL/min   GFR calc Af Amer 51 (L) >60 mL/min    Comment: (NOTE) The eGFR has been calculated using the CKD EPI equation. This calculation has not been validated in all clinical situations. eGFR's persistently <60 mL/min signify possible Chronic Kidney Disease.    Anion gap 11 5 - 15  CBC WITH DIFFERENTIAL     Status: Abnormal   Collection Time: 01/30/17  1:36 PM  Result Value Ref Range   WBC 5.6 4.0 - 10.5 K/uL   RBC 3.93 3.87 - 5.11 MIL/uL   Hemoglobin 11.2 (L) 12.0 - 15.0 g/dL   HCT 34.0 (L) 36.0 - 46.0 %   MCV 86.5 78.0 - 100.0 fL   MCH 28.5 26.0 - 34.0 pg   MCHC 32.9 30.0 - 36.0 g/dL   RDW 13.0 11.5 - 15.5 %   Platelets 300 150 - 400 K/uL   Neutrophils Relative % 61 %   Neutro Abs 3.4 1.7 - 7.7 K/uL   Lymphocytes Relative 25 %   Lymphs Abs 1.4 0.7 - 4.0 K/uL   Monocytes Relative 11 %   Monocytes Absolute 0.6 0.1 - 1.0 K/uL   Eosinophils Relative 3 %   Eosinophils Absolute 0.2 0.0 - 0.7 K/uL   Basophils Relative 0 %   Basophils Absolute 0.0 0.0 - 0.1 K/uL  APTT     Status: Abnormal   Collection Time: 01/30/17  1:36 PM  Result Value Ref Range   aPTT 38 (H) 24 - 36 seconds    Comment:        IF BASELINE aPTT IS ELEVATED, SUGGEST PATIENT RISK ASSESSMENT BE USED TO DETERMINE APPROPRIATE ANTICOAGULANT THERAPY.   Protime-INR       Status: None   Collection Time: 01/30/17  1:36 PM  Result Value Ref Range    Prothrombin Time 14.5 11.4 - 15.2 seconds   INR 1.13   Sedimentation rate     Status: Abnormal   Collection Time: 01/30/17  1:36 PM  Result Value Ref Range   Sed Rate 78 (H) 0 - 22 mm/hr   No results found.  Review of Systems  Musculoskeletal:       Right hand swelling and pain, wound infection  All other systems reviewed and are negative.   Blood pressure (!) 136/51, pulse 76, temperature 97.9 F (36.6 C), temperature source Oral, resp. rate 17, SpO2 96 %. Physical Exam  Constitutional: She is oriented to person, place, and time. She appears well-developed. She appears distressed.  HENT:  Head: Normocephalic and atraumatic.  Eyes: Conjunctivae and EOM are normal. Pupils are equal, round, and reactive to light.  Neck: Normal range of motion. Neck supple.  Cardiovascular: Normal rate, regular rhythm, normal heart sounds and intact distal pulses.   Respiratory: Effort normal and breath sounds normal.  GI: Soft. Bowel sounds are normal.  Musculoskeletal: Normal range of motion.       Right wrist: She exhibits tenderness and swelling.       Arms: Neurological: She is alert and oriented to person, place, and time. She has normal strength. No cranial nerve deficit or sensory deficit. She displays a negative Romberg sign. She displays no Babinski's sign on the right side. She displays Babinski's sign on the left side.  Reflex Scores:      Tricep reflexes are 2+ on the right side and 2+ on the left side.      Bicep reflexes are 2+ on the right side and 2+ on the left side.      Brachioradialis reflexes are 2+ on the right side and 2+ on the left side.      Patellar reflexes are 2+ on the right side and 2+ on the left side.      Achilles reflexes are 2+ on the right side and 2+ on the left side.    Assessment/Plan Possible OR for wound exploration IV antibiotics Sed rate elevated, but patient did just have surgery. Will monitor.  , L, MD 01/30/2017, 7:42 PM   

## 2017-01-30 NOTE — Progress Notes (Signed)
Unable to sign consent as no orders for surgery at this time.  Continue to monitor.

## 2017-01-30 NOTE — Progress Notes (Signed)
Patient arrived to unit.  Patient assisted to bed, alert, verbally pleasant. Vital signs obtained.  No noted signs of distress.  Spouse supportive and at bedside.

## 2017-01-31 NOTE — Progress Notes (Signed)
Patient ID: Stephanie Barber, female   DOB: Sep 17, 1941, 76 y.o.   MRN: [id number] BP 128/60 (BP Location: Left Arm)   Pulse 78   Temp 98.6 F (37 C) (Oral)   Resp 18   Ht 4\' 11"  (1.499 m)   Wt 57.6 kg (127 lb)   SpO2 97%   BMI 25.65 kg/m  Alert and oriented x 4 Hand is much improved, continue abx Less edema in hand, more movement

## 2017-02-01 LAB — BASIC METABOLIC PANEL
ANION GAP: 11 (ref 5–15)
BUN: 8 mg/dL (ref 6–20)
CALCIUM: 9.6 mg/dL (ref 8.9–10.3)
CO2: 22 mmol/L (ref 22–32)
Chloride: 107 mmol/L (ref 101–111)
Creatinine, Ser: 0.89 mg/dL (ref 0.44–1.00)
GFR calc Af Amer: >60 mL/min (ref >60)
Glucose, Bld: 102 mg/dL — ABNORMAL HIGH (ref 65–99)
Potassium: 4.5 mmol/L (ref 3.5–5.1)
SODIUM: 140 mmol/L (ref 135–145)

## 2017-02-01 LAB — VANCOMYCIN, TROUGH: VANCOMYCIN TR: 16 ug/mL (ref 15–20)

## 2017-02-01 NOTE — Care Management Note (Signed)
Case Management Note  Patient Details  Name: Stephanie Barber MRN: [id number] Date of Birth: 01-Jun-1941  Subjective/Objective:    Pt admitted with carpal tunnel infection with failed PO antibiotics at home. She is from home with her spouse.                 Action/Plan: Plan is to return home when medically stable. CM following for d/c needs, physician orders.  Expected Discharge Date:                  Expected Discharge Plan:  Home/Self Care  In-House Referral:     Discharge planning Services     Post Acute Care Choice:    Choice offered to:     DME Arranged:    DME Agency:     HH Arranged:    HH Agency:     Status of Service:  In process, will continue to follow  If discussed at Long Length of Stay Meetings, dates discussed:    Additional Comments:  Pollie Friar, RN 02/01/2017, 12:52 PM

## 2017-02-01 NOTE — Consult Note (Signed)
    [facility] CM Primary Care Navigator  02/01/2017  Stephanie Barber 1941/05/15 [id number]   Met with patient and husband Stephanie Barber) at the bedside to identify possible discharge needs. Patient reports having infected right hand wound after surgery that had led to this admission.  Patient endorses Dr. [name] with [facility] as the primary care provider.    Patient shared using Conservation officer, nature at [employer] and [facility] to obtain medications without any problem.   Patient states that husband and granddaughter Stephanie Barber) manages her medications at home using "pill box" system weekly.   Her husband or son Stephanie Barber) provides transportation to her doctors' appointments.  Patient's husband is the primary caregiver at home and other family members are able to assist with her care if needed.    Anticipated discharge plan is home perpatient.   Patient voiced understanding to call primary care provider's office when she gets back home, for a post discharge follow-up appointment within a week or sooner if needed.Patient letter (with PCP's contact number) was provided as her reminder.  Explained to patient about [facility] CM services available for health management but she states that DM is under control and well managed at home using Metformin and PCP is monitoring it with recent A1c of 6.2.   [facility] care management contact information provided for future needs that may arise.   For questions, please contact:  Dannielle Huh, BSN, RN- [facility] Primary Care Navigator  Telephone: [phone] [place]

## 2017-02-01 NOTE — Progress Notes (Signed)
Pharmacy Antibiotic Note  Stephanie Barber is a 76 y.o. female admitted on 01/30/2017 with wound infection of right hand after carpel tunnel surgery. Pharmacy has been consulted for vancomycin dosing. WBC 5.6, afebrile, no wound/blood Cx drawn Vancomycin 500mg  IV q12hr VT 16 - at goal Plan: Continue Vancomycin 500mg  IV every 12 hours.   Goal trough 15-20 mcg/mL. F/U length of therapy Monitor Renal function   Height: 4\' 11"  (149.9 cm) Weight: 127 lb (57.6 kg) IBW/kg (Calculated) : 43.2  Temp (24hrs), Avg:98.3 F (36.8 C), Min:98.2 F (36.8 C), Max:98.4 F (36.9 C)   Recent Labs Lab 01/30/17 1336 02/01/17 2127  WBC 5.6  --   CREATININE 1.18* 0.89  VANCOTROUGH  --  16    Estimated Creatinine Clearance: 41.6 mL/min (by C-G formula based on SCr of 0.89 mg/dL).    Allergies  Allergen Reactions  . Morphine And Related Anaphylaxis    Unknown.    Armanda Heritage [Procaine Hcl] Anaphylaxis  . Hydrocodone Diarrhea and Nausea And Vomiting   Bonnita Nasuti Pharm.D. CPP, BCPS Clinical Pharmacist [phone] 02/01/2017 10:13 PM

## 2017-02-01 NOTE — Progress Notes (Signed)
Patient ID: Stephanie Barber, female   DOB: 1941/08/23, 76 y.o.   MRN: [id number] BP (!) 144/49 (BP Location: Left Arm)   Pulse 65   Temp 98.2 F (36.8 C) (Oral)   Resp 18   Ht 4\' 11"  (1.499 m)   Wt 57.6 kg (127 lb)   SpO2 98%   BMI 25.65 kg/m  Swelling is much better Still with significant edema around wound edges.  Will see if I can express any pus tomorrow. Pain and tenderness much better

## 2017-02-02 NOTE — Discharge Instructions (Signed)
Call if your hand swells again, if you have a temperature greater than 101.5, or if it just feels worse

## 2017-02-02 NOTE — Progress Notes (Signed)
D/c reviewed with patient and spouse 

## 2017-02-02 NOTE — Discharge Summary (Signed)
Physician Discharge Summary  Patient ID: Stephanie Barber MRN: [id number] DOB/AGE: 20-Oct-1941 76 y.o.  Admit date: 01/30/2017 Discharge date: 02/02/2017  Admission Diagnoses:Wound infection  Discharge Diagnoses:  Active Problems:   Wound infection after surgery   Discharged Condition: good  Hospital Course: Stephanie Barber was admitted secondary to a wound infection after a carpal tunnel release. She was admitted and responded very well to IV antibiotics with her hand looking much better after 72 hours of vancomycin. She has oral antibiotics to finish. I will check her next week.   Treatments: as above  Discharge Exam: Blood pressure 134/63, pulse 63, temperature 98.7 F (37.1 C), temperature source Oral, resp. rate 18, height 4\' 11"  (1.499 m), weight 57.6 kg (127 lb), SpO2 100 %. General appearance: alert, cooperative, appears stated age and no distress  Disposition: 01-Home or Self Care * No surgery found *  Allergies as of 02/02/2017      Reactions   Morphine And Related Anaphylaxis   Unknown.     Novocain [procaine Hcl] Anaphylaxis   Hydrocodone Diarrhea, Nausea And Vomiting      Medication List    TAKE these medications   acetaminophen-codeine 300-30 MG tablet Commonly known as:  TYLENOL #3 Take 1 tablet by mouth every 6 (six) hours as needed. for pain   amLODipine 10 MG tablet Commonly known as:  NORVASC Take 10 mg by mouth daily after breakfast.   aspirin 81 MG tablet Take 81 mg by mouth at bedtime.   celecoxib 200 MG capsule Commonly known as:  CELEBREX Take 200 mg by mouth daily.   doxycycline 100 MG tablet Commonly known as:  VIBRA-TABS Take 100 mg by mouth 2 (two) times daily.   fish oil-omega-3 fatty acids 1000 MG capsule Take 1 g by mouth daily.   gabapentin 400 MG capsule Commonly known as:  NEURONTIN Take 400 mg by mouth at bedtime.   losartan 100 MG tablet Commonly known as:  COZAAR Take 100 mg by mouth daily after breakfast.    metFORMIN 500 MG tablet Commonly known as:  GLUCOPHAGE Take 500 mg by mouth 2 (two) times daily with a meal.   methocarbamol 500 MG tablet Commonly known as:  ROBAXIN Take 1 tablet (500 mg total) by mouth 4 (four) times daily.   metoprolol succinate 50 MG 24 hr tablet Commonly known as:  TOPROL-XL Take 50 mg by mouth daily after breakfast.   oxyCODONE-acetaminophen 5-325 MG tablet Commonly known as:  ROXICET Take 1-2 tablets by mouth every 6 (six) hours as needed for severe pain.   polysaccharide iron 150 MG capsule Generic drug:  iron polysaccharides Take 150 mg by mouth daily.   ranitidine 300 MG capsule Commonly known as:  ZANTAC Take 300 mg by mouth 2 (two) times daily.   SYSTANE ULTRA 0.4-0.3 % Soln Generic drug:  Polyethyl Glycol-Propyl Glycol Place 1 drop into both eyes daily.      Follow-up Information    Orris Perin L, MD. Schedule an appointment as soon as possible for a visit in 1 week(s).   Specialty:  Neurosurgery Contact information: [address] [place] [address] [phone]           Signed: Winfield Cunas 02/02/2017, 5:21 PM

## 2017-02-02 NOTE — Care Management Note (Signed)
Case Management Note  Patient Details  Name: CYNARA TATHAM MRN: [id number] Date of Birth: 09-21-1941  Subjective/Objective:                    Action/Plan: Pt discharging home with self care. No further needs per CM.  Expected Discharge Date:  02/02/17               Expected Discharge Plan:  Home/Self Care  In-House Referral:     Discharge planning Services     Post Acute Care Choice:    Choice offered to:     DME Arranged:    DME Agency:     HH Arranged:    HH Agency:     Status of Service:  Completed, signed off  If discussed at [employer] of Stay Meetings, dates discussed:    Additional Comments:  Pollie Friar, RN 02/02/2017, 6:36 PM

## 2017-02-03 LAB — URINE CULTURE

## 2017-02-08 DIAGNOSIS — T814XXA Infection following a procedure, initial encounter: Secondary | ICD-10-CM | POA: Diagnosis not present

## 2017-02-20 DIAGNOSIS — I10 Essential (primary) hypertension: Secondary | ICD-10-CM | POA: Diagnosis not present

## 2017-02-20 DIAGNOSIS — G5601 Carpal tunnel syndrome, right upper limb: Secondary | ICD-10-CM | POA: Diagnosis not present

## 2017-02-20 DIAGNOSIS — T814XXD Infection following a procedure, subsequent encounter: Secondary | ICD-10-CM | POA: Diagnosis not present

## 2017-02-20 DIAGNOSIS — Z6825 Body mass index (BMI) 25.0-25.9, adult: Secondary | ICD-10-CM | POA: Diagnosis not present

## 2017-03-02 DIAGNOSIS — M7582 Other shoulder lesions, left shoulder: Secondary | ICD-10-CM | POA: Diagnosis not present

## 2017-03-07 ENCOUNTER — Ambulatory Visit: Payer: Medicare Other | Attending: Family Medicine | Admitting: Physical Therapy

## 2017-03-07 DIAGNOSIS — M25512 Pain in left shoulder: Secondary | ICD-10-CM | POA: Insufficient documentation

## 2017-03-07 DIAGNOSIS — M25612 Stiffness of left shoulder, not elsewhere classified: Secondary | ICD-10-CM

## 2017-03-07 DIAGNOSIS — M6281 Muscle weakness (generalized): Secondary | ICD-10-CM | POA: Diagnosis not present

## 2017-03-08 NOTE — Therapy (Signed)
[place], [place], [address] Phone: [phone]   Fax:  [phone]  Physical Therapy Evaluation  Patient Details  Name: Stephanie Barber MRN: [id number] Date of Birth: 02-Aug-1941 Referring Provider: Dr Damaris Hippo   Encounter Date: 03/07/2017      PT End of Session - 03/08/17 1608    Visit Number 1   Number of Visits 16   Date for PT Re-Evaluation 05/03/17   Authorization Type medicare    PT Start Time 1500   PT Stop Time 1545   PT Time Calculation (min) 45 min   Activity Tolerance Patient tolerated treatment well   Behavior During Therapy [facility] for tasks assessed/performed      Past Medical History:  Diagnosis Date  . Arthritis   . Chronic headache   . Complication of anesthesia    allergy to Novocaine   . Diabetes mellitus   . GERD (gastroesophageal reflux disease)   . Heart murmur   . High cholesterol   . Hyperlipidemia   . Hypertension   . Intracranial atherosclerosis    per MRA  . Migraine   . Narcolepsy and cataplexy   . OSA on CPAP    residual AHI of-1.1, CPAPevery night   . Rectal prolapse     Past Surgical History:  Procedure Laterality Date  . ABDOMINAL HYSTERECTOMY    . BACK SURGERY     L4,L5 discectomy  . CARDIAC CATHETERIZATION    . CARPAL TUNNEL RELEASE Right 01/18/2017  . KIDNEY SURGERY  1998   Right.  growth removed  . rectal prolapse repair  12/05/11  . ROTATOR CUFF REPAIR     Left  . SHOULDER ARTHROSCOPY WITH SUBACROMIAL DECOMPRESSION, ROTATOR CUFF REPAIR AND BICEP TENDON REPAIR Left 05/25/2015   Procedure: SHOULDER ARTHROSCOPY WITH SUBACROMIAL DECOMPRESSION, ROTATOR CUFF REPAIR AND BICEP TENODESIS, DEBRIDEMENT. ;  Surgeon: Meredith Pel, MD;  Location: [place];  Service: Orthopedics;  Laterality: Left;  LEFT SHOULDER ROTATOR CUFF TEAR REPAIR, ARTHROSCOPY, DEBRIDEMENT, BICEPS TENODESIS, SUBACROMIAL DECOMPRESSION.  . TOTAL ABDOMINAL HYSTERECTOMY    . TUBAL  LIGATION      There were no vitals filed for this visit.       Subjective Assessment - 03/07/17 1453    Subjective Patient reports an incidious onset of left shoulder pain on 02/27/17 . The pain is the worst when she reaches down. She has pain down her left biceps. She alos is having pain when reaching overhead.    Pertinent History left rotator cuff surgery    Limitations House hold activities;Lifting   Diagnostic tests Nothing taken at this time    Patient Stated Goals to have no pain in her shoulder    Currently in Pain? Yes   Pain Score 3    Pain Location Shoulder   Pain Orientation Right   Pain Descriptors / Indicators Aching   Pain Type Acute pain   Pain Onset More than a month ago   Pain Frequency Constant   Aggravating Factors  use of her left shoulder    Pain Relieving Factors rest and ice    Effect of Pain on Daily Activities difficulty using her left arm for tasks             [facility] PT Assessment - 03/08/17 0001      Assessment   Medical Diagnosis Left shoulder pain    Referring Provider Dr Damaris Hippo    Onset Date/Surgical Date 02/27/17   Hand Dominance  Right   Next MD Visit Nothing scheduled    Prior Therapy For the rotator cuff tear      Precautions   Precautions None     Restrictions   Weight Bearing Restrictions No     Balance Screen   Has the patient fallen in the past 6 months No   Has the patient had a decrease in activity level because of a fear of falling?  No   Is the patient reluctant to leave their home because of a fear of falling?  No     Home Environment   Additional Comments Nothing significant      Prior Function   Level of Independence Independent   Vocation Retired   Leisure Nothing      Cognition   Overall Cognitive Status Within Functional Limits for tasks assessed   Attention Focused   Focused Attention Appears intact   Memory Appears intact   Awareness Appears intact   Problem Solving Appears intact      Observation/Other Assessments   Focus on Therapeutic Outcomes (FOTO)  37%      Sensation   Light Touch Appears Intact     Coordination   Gross Motor Movements are Fluid and Coordinated Yes   Fine Motor Movements are Fluid and Coordinated Yes     Posture/Postural Control   Posture Comments slightly rounded shoulders      AROM   Left Shoulder Flexion 120 Degrees   Left Shoulder ABduction 90 Degrees   Left Shoulder Internal Rotation --  to t-10   Left Shoulder External Rotation --  able to reach behind her head without significant pain      PROM   Overall PROM Comments pain with end range ER and flexion    Left Shoulder Flexion 136 Degrees   Left Shoulder ABduction 98 Degrees   Left Shoulder Internal Rotation 83 Degrees   Left Shoulder External Rotation 80 Degrees     Strength   Left Shoulder Flexion 4-/5   Left Shoulder ABduction 4/5   Left Shoulder Internal Rotation 4+/5   Left Shoulder External Rotation 4/5     Palpation   Palpation comment tenderness to palpation into the bicpes groove and into the bicpes; tenderness to plapation in the lateral shoulder      Special Tests    Special Tests Biceps/Labral Tests  Clunk test (-)    Rotator Cuff Impingment tests Michel Bickers test   Laxity/Instability  Load and shift test   Biceps/Labral tests Speeds Test     Hawkins-Kennedy test   Comments (+) left      Load and Shift test    Comment (-) bilateral      Speeds test   Comment (+) left      Yergason's Test   Comment (-) left                    [facility] Adult PT Treatment/Exercise - 03/08/17 0001      Shoulder Exercises: Supine   Other Supine Exercises supine wand flexion 2x10; supine ER 2x10; scapular retraction 2x10                 PT Education - 03/08/17 1606    Education provided Yes   Education Details symptom mangement; improtance of ROM; HEP    Person(s) Educated Patient   Methods Explanation;Demonstration;Verbal cues;Tactile cues    Comprehension Verbalized understanding;Returned demonstration;Tactile cues required;Verbal cues required  PT Short Term Goals - 03/07/17 1558      PT SHORT TERM GOAL #1   Title Patient will increase passive flexion by 25 degrees   Time 4   Period Weeks   Status New     PT SHORT TERM GOAL #2   Title Patient will demsotrate full pain free external rotation    Time 4   Period Weeks   Status New     PT SHORT TERM GOAL #3   Title Patient will demsotrate 5/5 gross left shoulder strength    Time 4   Period Weeks   Status New     PT SHORT TERM GOAL #4   Title Patient will be independent with initial HEP    Time 4   Period Weeks   Status New           PT Long Term Goals - 03/07/17 1605      PT LONG TERM GOAL #1   Title Patient will reach down and pick object off the ground without increased pain in her left arm and biceps    Time 8   Period Weeks   Status New     PT LONG TERM GOAL #2   Title Patient will demsotrate a 29% deficit on FOTO    Time 8   Period Weeks   Status New               Plan - 03/08/17 1737    Clinical Impression Statement Patient is a 76 year old female. Signs and symptoms are consistent with diagnosis of rotator cuff impingement and biceps tendinitits. She has pain when reaching with ther left arm up or down. she has weakness, and decreased active/passive ROM of the left shoulder. she would benefit from skilled therapy to address the above deficits. She was seen for a low complexity evaluation.    Rehab Potential Good   PT Frequency 2x / week   PT Duration 8 weeks   PT Treatment/Interventions ADLs/Self Care Home Management;Cryotherapy;Electrical Stimulation;Iontophoresis 4mg /ml Dexamethasone;Moist Heat;Traction;Ultrasound;Gait training;Stair training;Therapeutic activities;Therapeutic exercise;Patient/family education;Manual techniques;Dry needling;Passive range of motion;Taping;Splinting   PT Next Visit Plan consider adding  yellow band ER/IR, continue with PROM, Continue with low grade mobilizations for pain; consdier supoine ABC for stabilization, consider pulleys   PT Home Exercise Plan wand flexion, wan ER, scap retraction    Consulted and Agree with Plan of Care Patient      Patient will benefit from skilled therapeutic intervention in order to improve the following deficits and impairments:  Decreased activity tolerance, Decreased strength, Decreased endurance, Decreased safety awareness, Increased fascial restricitons, Decreased range of motion, Impaired UE functional use  Visit Diagnosis: Acute pain of left shoulder - Plan: PT plan of care cert/re-cert  Stiffness of left shoulder, not elsewhere classified - Plan: PT plan of care cert/re-cert  Muscle weakness (generalized) - Plan: PT plan of care cert/re-cert      G-Codes - 53/66/44 1550    Functional Assessment Tool Used (Outpatient Only) foto, clinical decision malking    Functional Limitation Carrying, moving and handling objects   Carrying, Moving and Handling Objects Current Status [phone]) At least 40 percent but less than 60 percent impaired, limited or restricted   Carrying, Moving and Handling Objects Goal Status ([id number]) At least 1 percent but less than 20 percent impaired, limited or restricted       Problem List Patient Active Problem List   Diagnosis Date Noted  . Wound infection after surgery 01/30/2017  .  Palpitations 08/24/2016  . Noncompliance with CPAP treatment 12/28/2015  . Poor compliance with CPAP treatment 10/26/2014  . CPAP use counseling 07/28/2014  . OSA on CPAP 07/28/2014  . 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 Hypersomnolent 04/02/2014  . Obstructive sleep apnea 04/02/2014  . Non-compliance with treatment 03/05/2014  . Diabetes mellitus 12/05/2011  . Hypertension 12/05/2011  . GERD (gastroesophageal reflux disease)   . 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 Chronic cough 07/26/2011    Carney Living PT DPT  03/08/2017, 5:45 PM  [facility] [address] [place], [place], [address] Phone: [phone]   Fax:  [phone]  Name: Stephanie Barber MRN: [id number] Date of Birth: October 18, 1941

## 2017-03-15 ENCOUNTER — Encounter: Payer: Self-pay | Admitting: Physical Therapy

## 2017-03-15 ENCOUNTER — Ambulatory Visit: Payer: Medicare Other | Attending: Family Medicine | Admitting: Physical Therapy

## 2017-03-15 DIAGNOSIS — M25641 Stiffness of right hand, not elsewhere classified: Secondary | ICD-10-CM | POA: Insufficient documentation

## 2017-03-15 DIAGNOSIS — M25631 Stiffness of right wrist, not elsewhere classified: Secondary | ICD-10-CM | POA: Diagnosis not present

## 2017-03-15 DIAGNOSIS — M25541 Pain in joints of right hand: Secondary | ICD-10-CM | POA: Diagnosis not present

## 2017-03-15 DIAGNOSIS — M25531 Pain in right wrist: Secondary | ICD-10-CM | POA: Diagnosis not present

## 2017-03-15 DIAGNOSIS — M25512 Pain in left shoulder: Secondary | ICD-10-CM | POA: Diagnosis not present

## 2017-03-15 DIAGNOSIS — R208 Other disturbances of skin sensation: Secondary | ICD-10-CM | POA: Diagnosis not present

## 2017-03-15 DIAGNOSIS — R278 Other lack of coordination: Secondary | ICD-10-CM | POA: Diagnosis not present

## 2017-03-15 DIAGNOSIS — M6281 Muscle weakness (generalized): Secondary | ICD-10-CM | POA: Insufficient documentation

## 2017-03-15 DIAGNOSIS — M25612 Stiffness of left shoulder, not elsewhere classified: Secondary | ICD-10-CM | POA: Insufficient documentation

## 2017-03-16 ENCOUNTER — Encounter: Payer: Self-pay | Admitting: Physical Therapy

## 2017-03-16 ENCOUNTER — Ambulatory Visit: Payer: Medicare Other | Admitting: Physical Therapy

## 2017-03-16 DIAGNOSIS — M6281 Muscle weakness (generalized): Secondary | ICD-10-CM | POA: Diagnosis not present

## 2017-03-16 DIAGNOSIS — M25612 Stiffness of left shoulder, not elsewhere classified: Secondary | ICD-10-CM | POA: Diagnosis not present

## 2017-03-16 DIAGNOSIS — M25631 Stiffness of right wrist, not elsewhere classified: Secondary | ICD-10-CM | POA: Diagnosis not present

## 2017-03-16 DIAGNOSIS — M25512 Pain in left shoulder: Secondary | ICD-10-CM

## 2017-03-16 DIAGNOSIS — M25641 Stiffness of right hand, not elsewhere classified: Secondary | ICD-10-CM | POA: Diagnosis not present

## 2017-03-16 DIAGNOSIS — M25541 Pain in joints of right hand: Secondary | ICD-10-CM | POA: Diagnosis not present

## 2017-03-16 NOTE — Therapy (Signed)
[place], [place], [address] Phone: [phone]   Fax:  [phone]  Physical Therapy Treatment  Patient Details  Name: Stephanie Barber MRN: [id number] Date of Birth: 14-Mar-1941 Referring Provider: Dr Damaris Hippo   Encounter Date: 03/15/2017      PT End of Session - 03/15/17 1439    Visit Number 2   Number of Visits 16   Date for PT Re-Evaluation 05/03/17   Authorization Type medicare    PT Start Time 1415   PT Stop Time 1458   PT Time Calculation (min) 43 min   Activity Tolerance Patient tolerated treatment well   Behavior During Therapy [facility] for tasks assessed/performed      Past Medical History:  Diagnosis Date  . Arthritis   . Chronic headache   . Complication of anesthesia    allergy to Novocaine   . Diabetes mellitus   . GERD (gastroesophageal reflux disease)   . Heart murmur   . High cholesterol   . Hyperlipidemia   . Hypertension   . Intracranial atherosclerosis    per MRA  . Migraine   . Narcolepsy and cataplexy   . OSA on CPAP    residual AHI of-1.1, CPAPevery night   . Rectal prolapse     Past Surgical History:  Procedure Laterality Date  . ABDOMINAL HYSTERECTOMY    . BACK SURGERY     L4,L5 discectomy  . CARDIAC CATHETERIZATION    . CARPAL TUNNEL RELEASE Right 01/18/2017  . KIDNEY SURGERY  1998   Right.  growth removed  . rectal prolapse repair  12/05/11  . ROTATOR CUFF REPAIR     Left  . SHOULDER ARTHROSCOPY WITH SUBACROMIAL DECOMPRESSION, ROTATOR CUFF REPAIR AND BICEP TENDON REPAIR Left 05/25/2015   Procedure: SHOULDER ARTHROSCOPY WITH SUBACROMIAL DECOMPRESSION, ROTATOR CUFF REPAIR AND BICEP TENODESIS, DEBRIDEMENT. ;  Surgeon: Meredith Pel, MD;  Location: [place];  Service: Orthopedics;  Laterality: Left;  LEFT SHOULDER ROTATOR CUFF TEAR REPAIR, ARTHROSCOPY, DEBRIDEMENT, BICEPS TENODESIS, SUBACROMIAL DECOMPRESSION.  . TOTAL ABDOMINAL HYSTERECTOMY    . TUBAL  LIGATION      There were no vitals filed for this visit.      Subjective Assessment - 03/15/17 1426    Subjective Patient tolerated initial eval well. She was a little sore. She feels like she is having less pain reaching up at this time.    Pertinent History left rotator cuff surgery    Limitations House hold activities;Lifting   Diagnostic tests Nothing taken at this time    Patient Stated Goals to have no pain in her shoulder    Currently in Pain? Yes   Pain Score 2    Pain Location Shoulder   Pain Orientation Right   Pain Descriptors / Indicators Aching   Pain Type Acute pain   Pain Onset More than a month ago   Pain Frequency Constant   Aggravating Factors  use of the left shoulder    Pain Relieving Factors rest and ice    Effect of Pain on Daily Activities difficulty using her left arm for tasks                          [facility] Adult PT Treatment/Exercise - 03/15/17 0001      Shoulder Exercises: Supine   Other Supine Exercises supine wand flexion 2x10; supine ER 2x10;  PT Education - 03/15/17 1439    Education provided Yes   Education Details continue with exercises; updated HEP    Person(s) Educated Patient   Methods Explanation;Demonstration;Tactile cues;Verbal cues   Comprehension Verbalized understanding;Returned demonstration;Verbal cues required;Tactile cues required          PT Short Term Goals - 03/16/17 0756      PT SHORT TERM GOAL #1   Title Patient will increase passive flexion by 25 degrees   Time 4   Period Weeks   Status On-going     PT SHORT TERM GOAL #2   Title Patient will demonstrate full pain free external rotation    Time 4   Period Weeks   Status On-going     PT SHORT TERM GOAL #3   Title Patient will demsotrate 5/5 gross left shoulder strength    Time 4   Period Weeks   Status On-going     PT SHORT TERM GOAL #4   Title Patient will be independent with initial HEP    Time 4   Period  Weeks   Status On-going           PT Long Term Goals - 03/07/17 1605      PT LONG TERM GOAL #1   Title Patient will reach down and pick object off the ground without increased pain in her left arm and biceps    Time 8   Period Weeks   Status New     PT LONG TERM GOAL #2   Title Patient will demsotrate a 29% deficit on FOTO    Time 8   Period Weeks   Status New               Plan - 03/15/17 1440    Clinical Impression Statement Patient tolerated treatment well. Therapy added band strengthening and d2 strengthening. She ha dno signifcant increase in pain. She was given an updated HEP. The patient will try to get a perscription for her carapal tunel.    Rehab Potential Good   PT Frequency 2x / week   PT Duration 8 weeks   PT Treatment/Interventions ADLs/Self Care Home Management;Cryotherapy;Electrical Stimulation;Iontophoresis 4mg /ml Dexamethasone;Moist Heat;Traction;Ultrasound;Gait training;Stair training;Therapeutic activities;Therapeutic exercise;Patient/family education;Manual techniques;Dry needling;Passive range of motion;Taping;Splinting   PT Next Visit Plan consider adding yellow band ER/IR, continue with PROM, Continue with low grade mobilizations for pain; consdier supine ABC for stabilization, Eval for carpla tunnel when script recieved    PT Home Exercise Plan wand flexion, wand ER, scap retraction    Consulted and Agree with Plan of Care Patient      Patient will benefit from skilled therapeutic intervention in order to improve the following deficits and impairments:  Decreased activity tolerance, Decreased strength, Decreased endurance, Decreased safety awareness, Increased fascial restricitons, Decreased range of motion, Impaired UE functional use  Visit Diagnosis: Acute pain of left shoulder  Stiffness of left shoulder, not elsewhere classified  Muscle weakness (generalized)     Problem List Patient Active Problem List   Diagnosis Date Noted  .  Wound infection after surgery 01/30/2017  . Palpitations 08/24/2016  . Noncompliance with CPAP treatment 12/28/2015  . Poor compliance with CPAP treatment 10/26/2014  . CPAP use counseling 07/28/2014  . OSA on CPAP 07/28/2014  . 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 Hypersomnolent 04/02/2014  . Obstructive sleep apnea 04/02/2014  . Non-compliance with treatment 03/05/2014  . Diabetes mellitus 12/05/2011  . Hypertension 12/05/2011  . GERD (gastroesophageal reflux disease)   .  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 Chronic cough 07/26/2011    Carney Living PT DPT  03/16/2017, 7:57 AM  [facility] [address] [place], [place], [address] Phone: [phone]   Fax:  [phone]  Name: Stephanie Barber MRN: [id number] Date of Birth: 1941-09-03

## 2017-03-16 NOTE — Therapy (Signed)
[place], [place], [address] Phone: [phone]   Fax:  [phone]  Physical Therapy Treatment  Patient Details  Name: ILIANA HUTT MRN: [id number] Date of Birth: 11-Nov-1941 Referring Provider: Dr Damaris Hippo   Encounter Date: 03/16/2017      PT End of Session - 03/16/17 1247    Visit Number 3   Number of Visits 16   Date for PT Re-Evaluation 05/03/17   Authorization Type medicare    PT Start Time 1230   PT Stop Time 1315   PT Time Calculation (min) 45 min   Activity Tolerance Patient tolerated treatment well   Behavior During Therapy [facility] for tasks assessed/performed      Past Medical History:  Diagnosis Date  . Arthritis   . Chronic headache   . Complication of anesthesia    allergy to Novocaine   . Diabetes mellitus   . GERD (gastroesophageal reflux disease)   . Heart murmur   . High cholesterol   . Hyperlipidemia   . Hypertension   . Intracranial atherosclerosis    per MRA  . Migraine   . Narcolepsy and cataplexy   . OSA on CPAP    residual AHI of-1.1, CPAPevery night   . Rectal prolapse     Past Surgical History:  Procedure Laterality Date  . ABDOMINAL HYSTERECTOMY    . BACK SURGERY     L4,L5 discectomy  . CARDIAC CATHETERIZATION    . CARPAL TUNNEL RELEASE Right 01/18/2017  . KIDNEY SURGERY  1998   Right.  growth removed  . rectal prolapse repair  12/05/11  . ROTATOR CUFF REPAIR     Left  . SHOULDER ARTHROSCOPY WITH SUBACROMIAL DECOMPRESSION, ROTATOR CUFF REPAIR AND BICEP TENDON REPAIR Left 05/25/2015   Procedure: SHOULDER ARTHROSCOPY WITH SUBACROMIAL DECOMPRESSION, ROTATOR CUFF REPAIR AND BICEP TENODESIS, DEBRIDEMENT. ;  Surgeon: Meredith Pel, MD;  Location: [facility];  Service: Orthopedics;  Laterality: Left;  LEFT SHOULDER ROTATOR CUFF TEAR REPAIR, ARTHROSCOPY, DEBRIDEMENT, BICEPS TENODESIS, SUBACROMIAL DECOMPRESSION.  . TOTAL ABDOMINAL HYSTERECTOMY    . TUBAL  LIGATION      There were no vitals filed for this visit.      Subjective Assessment - 03/16/17 1234    Subjective Patient reports she was a little sore after treatment yesterday. her pain level is about a 4/10. She felt it a few times when she was reaching but she reports it is not as bad as it has been.    Pertinent History left rotator cuff surgery    Limitations House hold activities;Lifting   Diagnostic tests Nothing taken at this time    Patient Stated Goals to have no pain in her shoulder    Currently in Pain? Yes   Pain Score 4    Pain Onset More than a month ago   Pain Frequency Constant   Aggravating Factors  use of the shoulder    Pain Relieving Factors rest and ice    Effect of Pain on Daily Activities diffciulty using the left arm                         [facility] Adult PT Treatment/Exercise - 03/16/17 0001      Shoulder Exercises: Supine   Other Supine Exercises supine wand flexion 2x10; supine ER 2x10;     Shoulder Exercises: Standing   Other Standing Exercises upper extremity ranger 2x10; standing extension 2x10 yellow; scap retraction yellow  2x10      Manual Therapy   Manual therapy comments PROM into all planes; grade I and II PA glides and AP glides.                 PT Education - 03/16/17 1235    Education provided Yes   Education Details continue with current HEP    Person(s) Educated Patient   Methods Explanation;Demonstration;Tactile cues;Verbal cues   Comprehension Verbalized understanding;Returned demonstration;Verbal cues required;Tactile cues required          PT Short Term Goals - 03/16/17 0756      PT SHORT TERM GOAL #1   Title Patient will increase passive flexion by 25 degrees   Time 4   Period Weeks   Status On-going     PT SHORT TERM GOAL #2   Title Patient will demonstrate full pain free external rotation    Time 4   Period Weeks   Status On-going     PT SHORT TERM GOAL #3   Title Patient will  demsotrate 5/5 gross left shoulder strength    Time 4   Period Weeks   Status On-going     PT SHORT TERM GOAL #4   Title Patient will be independent with initial HEP    Time 4   Period Weeks   Status On-going           PT Long Term Goals - 03/07/17 1605      PT LONG TERM GOAL #1   Title Patient will reach down and pick object off the ground without increased pain in her left arm and biceps    Time 8   Period Weeks   Status New     PT LONG TERM GOAL #2   Title Patient will demsotrate a 29% deficit on FOTO    Time 8   Period Weeks   Status New               Plan - 03/16/17 1248    Clinical Impression Statement Patients pain free range continues to improve. She was able to complete exercises with no increase in soreness. She was encouraged to ice at home if she has pain after exercises.    Rehab Potential Good   PT Frequency 2x / week   PT Duration 8 weeks   PT Treatment/Interventions ADLs/Self Care Home Management;Cryotherapy;Electrical Stimulation;Iontophoresis 4mg /ml Dexamethasone;Moist Heat;Traction;Ultrasound;Gait training;Stair training;Therapeutic activities;Therapeutic exercise;Patient/family education;Manual techniques;Dry needling;Passive range of motion;Taping;Splinting   PT Next Visit Plan consider adding yellow band ER/IR, continue with PROM, Continue with low grade mobilizations for pain; consdier supine ABC for stabilization, Eval for carpla tunnel when script recieved    PT Home Exercise Plan wand flexion, wand ER, scap retraction    Consulted and Agree with Plan of Care Patient      Patient will benefit from skilled therapeutic intervention in order to improve the following deficits and impairments:  Decreased activity tolerance, Decreased strength, Decreased endurance, Decreased safety awareness, Increased fascial restricitons, Decreased range of motion, Impaired UE functional use  Visit Diagnosis: Acute pain of left shoulder  Stiffness of left  shoulder, not elsewhere classified  Muscle weakness (generalized)     Problem List Patient Active Problem List   Diagnosis Date Noted  . Wound infection after surgery 01/30/2017  . Palpitations 08/24/2016  . Noncompliance with CPAP treatment 12/28/2015  . Poor compliance with CPAP treatment 10/26/2014  . CPAP use counseling 07/28/2014  . OSA on CPAP 07/28/2014  . Chronic ethmoidal  sinusitis 07/28/2014  . Hypersomnolent 04/02/2014  . Obstructive sleep apnea 04/02/2014  . Non-compliance with treatment 03/05/2014  . Diabetes mellitus 12/05/2011  . Hypertension 12/05/2011  . GERD (gastroesophageal reflux disease)   . 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 Chronic cough 07/26/2011    Carney Living PT DPT  03/16/2017, 1:50 PM  [facility] [address] [place], [place], [address] Phone: [phone]   Fax:  [phone]  Name: KENDLE TURBIN MRN: [id number] Date of Birth: 05-09-1941

## 2017-03-21 ENCOUNTER — Encounter: Payer: Self-pay | Admitting: Occupational Therapy

## 2017-03-21 ENCOUNTER — Encounter: Payer: Self-pay | Admitting: Physical Therapy

## 2017-03-21 ENCOUNTER — Ambulatory Visit: Payer: Medicare Other | Admitting: Occupational Therapy

## 2017-03-21 ENCOUNTER — Ambulatory Visit: Payer: Medicare Other | Admitting: Physical Therapy

## 2017-03-21 DIAGNOSIS — M25612 Stiffness of left shoulder, not elsewhere classified: Secondary | ICD-10-CM | POA: Diagnosis not present

## 2017-03-21 DIAGNOSIS — M25541 Pain in joints of right hand: Secondary | ICD-10-CM | POA: Diagnosis not present

## 2017-03-21 DIAGNOSIS — M25631 Stiffness of right wrist, not elsewhere classified: Secondary | ICD-10-CM

## 2017-03-21 DIAGNOSIS — M6281 Muscle weakness (generalized): Secondary | ICD-10-CM

## 2017-03-21 DIAGNOSIS — M25641 Stiffness of right hand, not elsewhere classified: Secondary | ICD-10-CM | POA: Diagnosis not present

## 2017-03-21 DIAGNOSIS — M25512 Pain in left shoulder: Secondary | ICD-10-CM

## 2017-03-21 DIAGNOSIS — R208 Other disturbances of skin sensation: Secondary | ICD-10-CM

## 2017-03-21 DIAGNOSIS — M25531 Pain in right wrist: Secondary | ICD-10-CM

## 2017-03-21 DIAGNOSIS — R278 Other lack of coordination: Secondary | ICD-10-CM

## 2017-03-21 NOTE — Patient Instructions (Signed)
Prayer stretch  Seated at table with elbows resting on table. Goal is to slowly stretch but keep the heels of your hands touching. Slowly move elbows apart and lower hands. 3X  Daily, no more than 5 min at a time.  Respect the stretch- when you feel stretching- count to 20

## 2017-03-21 NOTE — Therapy (Signed)
[facility] [address] [place], [place], [address] Phone: [phone]   Fax:  [phone]  Occupational Therapy Evaluation  Patient Details  Name: Stephanie Barber MRN: [id number] Date of Birth: 1941/08/04 Referring Provider: Dr Christella Noa  Encounter Date: 03/21/2017      OT End of Session - 03/21/17 [place]    Visit Number 1   Number of Visits 16   Date for OT Re-Evaluation 05/20/17   Authorization Type [employer] trad- primary   Authorization Time Period gcode and pn every 10th visit   Authorization - Visit Number 1   Authorization - Number of Visits 10   OT Start Time 1400   OT Stop Time 1445   OT Time Calculation (min) 45 min   Activity Tolerance Patient tolerated treatment well   Behavior During Therapy [facility] for tasks assessed/performed      Past Medical History:  Diagnosis Date  . Arthritis   . Chronic headache   . Complication of anesthesia    allergy to Novocaine   . Diabetes mellitus   . GERD (gastroesophageal reflux disease)   . Heart murmur   . High cholesterol   . Hyperlipidemia   . Hypertension   . Intracranial atherosclerosis    per MRA  . Migraine   . Narcolepsy and cataplexy   . OSA on CPAP    residual AHI of-1.1, CPAPevery night   . Rectal prolapse     Past Surgical History:  Procedure Laterality Date  . ABDOMINAL HYSTERECTOMY    . BACK SURGERY     L4,L5 discectomy  . CARDIAC CATHETERIZATION    . CARPAL TUNNEL RELEASE Right 01/18/2017  . KIDNEY SURGERY  1998   Right.  growth removed  . rectal prolapse repair  12/05/11  . ROTATOR CUFF REPAIR     Left  . SHOULDER ARTHROSCOPY WITH SUBACROMIAL DECOMPRESSION, ROTATOR CUFF REPAIR AND BICEP TENDON REPAIR Left 05/25/2015   Procedure: SHOULDER ARTHROSCOPY WITH SUBACROMIAL DECOMPRESSION, ROTATOR CUFF REPAIR AND BICEP TENODESIS, DEBRIDEMENT. ;  Surgeon: Meredith Pel, MD;  Location: [facility];  Service: Orthopedics;  Laterality: Left;  LEFT  SHOULDER ROTATOR CUFF TEAR REPAIR, ARTHROSCOPY, DEBRIDEMENT, BICEPS TENODESIS, SUBACROMIAL DECOMPRESSION.  . TOTAL ABDOMINAL HYSTERECTOMY    . TUBAL LIGATION      There were no vitals filed for this visit.      Subjective Assessment - 03/21/17 1817    Currently in Pain? Yes   Pain Score 8    Pain Location Hand   Pain Orientation Right   Pain Descriptors / Indicators Burning;Shooting   Pain Type Chronic pain   Pain Onset More than a month ago   Pain Frequency Constant   Aggravating Factors  unexpected contact with palm   Pain Relieving Factors protective posture   Effect of Pain on Daily Activities limits use of right hand           [facility] OT Assessment - 03/21/17 0001      Assessment   Diagnosis Right carpal tunnel   Referring Provider Dr Christella Noa   Onset Date 01/18/17   Prior Therapy No, patient developed infection     Precautions   Precautions None   Required Braces or Orthoses --     Restrictions   Weight Bearing Restrictions No     Balance Screen   Has the patient fallen in the past 6 months No     Prior Function   Level of Independence Independent   Vocation Retired  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     ADL   Upper Body Dressing Increased time;Needs assist for fasteners   ADL comments Difficulty using cell phone for calling or texting     IADL   Prior Level of Function Meal Prep independent   Meal Prep Able to complete simple cold meal and snack prep     Written Expression   Dominant Hand Right   Handwriting 100% legible  fatigues quickly then quality diminishes     Cognition   Overall Cognitive Status Within Functional Limits for tasks assessed   Attention --     Sensation   Light Touch Impaired by gross assessment  hypersensitive in palm/ ulnar aspect of heel of hand   Additional Comments Reports numbness in tips of fingers     Coordination   Gross Motor Movements are Fluid and Coordinated No   Fine Motor Movements are  Fluid and Coordinated No   Right 9 Hole Peg Test 38.72   Left 9 Hole Peg Test 32.56     AROM   Right Wrist Extension 35 Degrees   Right Wrist Flexion 42 Degrees   Right Wrist Radial Deviation 10 Degrees   Right Wrist Ulnar Deviation 40 Degrees   Left Wrist Extension 55 Degrees   Left Wrist Flexion 65 Degrees     PROM   Right Wrist Extension 45 Degrees   Right Wrist Flexion 65 Degrees   Right Wrist Radial Deviation 15 Degrees     Hand Function   Right Hand Gross Grasp Impaired   Right Hand Grip (lbs) 25   Right Hand Lateral Pinch 9 lbs   Left Hand Gross Grasp Impaired   Left Hand Grip (lbs) 44   Left Hand Lateral Pinch 12 lbs   Comment Lacks full composite flexion digits                         OT Education - 03/21/17 1829    Education provided Yes   Education Details initiated HEP - Stretch for wrist extension, reinforced benefit of massage over scar   Person(s) Educated Patient   Methods Explanation;Demonstration;Tactile cues;Verbal cues;Handout   Comprehension Verbalized understanding;Returned demonstration;Verbal cues required;Need further instruction          OT Short Term Goals - 03/21/17 1852      OT SHORT TERM GOAL #1   Title Patient will complete home exercise program designed to improve coordination right hand DUE 04/20/17   Time 4   Period Weeks   Status New     OT SHORT TERM GOAL #2   Title Patient will demonstrate improved active range of motion in right wrist extension to 45 degrees to aide with simple cooking tasks   Time 4   Period Weeks   Status New     OT SHORT TERM GOAL #3   Time 4   Period Weeks   Status New           OT Long Term Goals - 03/21/17 1852      OT LONG TERM GOAL #1   Title Patient will complete HEP designed to improve hand strength due 05/20/17   Time 8   Period Weeks   Status New     OT LONG TERM GOAL #2   Title Patient will demonstrate improved active wrist extension to 60 degrees to aide  with  IADL- cooking, housekeeping   Time 8   Period Weeks   Status  New     OT LONG TERM GOAL #3   Title Patient will legibly write at paragraph level without pain or fatigue   Time 8   Period Weeks   Status New     OT LONG TERM GOAL #4   Title Patient will demonstrate improved coordiantion on 9hole peg test by 3 seconds right hand   Baseline baseline 38.72   Time 8   Period Weeks   Status New     OT LONG TERM GOAL #5   Title Patient will report no greater pain at rest (in right wrist / hand) than 3/10   Time 8   Period Weeks   Status New               Plan - 03/21/17 1831    Clinical Impression Statement Patient is a 76 year old woman, currently being seen by PT for left shoulder pain, referred to OT following carpal tunnel repair (52/8413)- complicated by post operative infection.  Patient admitted 3/20-3/23/18 for IV course to treat infection.  Patient presents to OT with pain in dominant right hand, with decreased strength, decreased sensation, decreased coordination, decreased passive and active range of motion of wrist and digits which impede her performance with ADL/IADL.    Rehab Potential Good   OT Frequency 2x / week   OT Duration 8 weeks   OT Treatment/Interventions Self-care/ADL training;Cryotherapy;Electrical Stimulation;Moist Heat;Fluidtherapy;Parrafin;Ultrasound;Therapeutic exercise;Neuromuscular education;DME and/or AE instruction;Passive range of motion;Scar mobilization;Manual Therapy;Splinting;Therapeutic activities;Patient/family education   Plan Scar management, ultrasound/ massage- passive range - active range wrist and digits.  HEP hand wrist self range    OT Home Exercise Plan intiiated wrist extension stretch   Consulted and Agree with Plan of Care Patient      Patient will benefit from skilled therapeutic intervention in order to improve the following deficits and impairments:  Decreased coordination, Decreased range of motion, Decreased skin integrity,  Decreased scar mobility, Decreased strength, Increased edema, Impaired UE functional use, Pain, Impaired sensation  Visit Diagnosis: Stiffness of right hand, not elsewhere classified - Plan: Ot plan of care cert/re-cert  Stiffness of right wrist, not elsewhere classified - Plan: Ot plan of care cert/re-cert  Pain in joint of right hand - Plan: Ot plan of care cert/re-cert  Pain in right wrist - Plan: Ot plan of care cert/re-cert  Other lack of coordination - Plan: Ot plan of care cert/re-cert  Other disturbances of skin sensation - Plan: Ot plan of care cert/re-cert  Muscle weakness (generalized) - Plan: Ot plan of care cert/re-cert      G-Codes - 24/40/10 1849    Functional Assessment Tool Used (Outpatient only) skilled clinical judgement, 9 hole peg test   Functional Limitation Carrying, moving and handling objects   Carrying, Moving and Handling Objects Current Status (U7253) At least 40 percent but less than 60 percent impaired, limited or restricted   Carrying, Moving and Handling Objects Goal Status (G6440) At least 20 percent but less than 40 percent impaired, limited or restricted      Problem List Patient Active Problem List   Diagnosis Date Noted  . Wound infection after surgery 01/30/2017  . Palpitations 08/24/2016  . Noncompliance with CPAP treatment 12/28/2015  . Poor compliance with CPAP treatment 10/26/2014  . CPAP use counseling 07/28/2014  . OSA on CPAP 07/28/2014  . 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 Hypersomnolent 04/02/2014  . Obstructive sleep apnea 04/02/2014  . Non-compliance with treatment 03/05/2014  . Diabetes mellitus 12/05/2011  .  Hypertension 12/05/2011  . GERD (gastroesophageal reflux disease)   . 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 Chronic cough 07/26/2011    Mariah Milling, OTR/L 03/21/2017, 6:55 PM  [facility] [address] [place], [place],  [address] Phone: [phone]   Fax:  [phone]  Name: Stephanie Barber MRN: [id number] Date of Birth: 01-11-1941

## 2017-03-22 NOTE — Therapy (Signed)
[place], [place], [address] Phone: [phone]   Fax:  [phone]  Physical Therapy Treatment  Patient Details  Name: Stephanie Barber MRN: [id number] Date of Birth: Nov 30, 1940 Referring Provider: Dr Damaris Hippo   Encounter Date: 03/21/2017      PT End of Session - 03/21/17 1508    Visit Number 4   Number of Visits 16   Date for PT Re-Evaluation 05/03/17   Authorization Type medicare    PT Start Time 1502   PT Stop Time 1543   PT Time Calculation (min) 41 min   Activity Tolerance Patient tolerated treatment well   Behavior During Therapy [facility] for tasks assessed/performed      Past Medical History:  Diagnosis Date  . Arthritis   . Chronic headache   . Complication of anesthesia    allergy to Novocaine   . Diabetes mellitus   . GERD (gastroesophageal reflux disease)   . Heart murmur   . High cholesterol   . Hyperlipidemia   . Hypertension   . Intracranial atherosclerosis    per MRA  . Migraine   . Narcolepsy and cataplexy   . OSA on CPAP    residual AHI of-1.1, CPAPevery night   . Rectal prolapse     Past Surgical History:  Procedure Laterality Date  . ABDOMINAL HYSTERECTOMY    . BACK SURGERY     L4,L5 discectomy  . CARDIAC CATHETERIZATION    . CARPAL TUNNEL RELEASE Right 01/18/2017  . KIDNEY SURGERY  1998   Right.  growth removed  . rectal prolapse repair  12/05/11  . ROTATOR CUFF REPAIR     Left  . SHOULDER ARTHROSCOPY WITH SUBACROMIAL DECOMPRESSION, ROTATOR CUFF REPAIR AND BICEP TENDON REPAIR Left 05/25/2015   Procedure: SHOULDER ARTHROSCOPY WITH SUBACROMIAL DECOMPRESSION, ROTATOR CUFF REPAIR AND BICEP TENODESIS, DEBRIDEMENT. ;  Surgeon: Meredith Pel, MD;  Location: [facility];  Service: Orthopedics;  Laterality: Left;  LEFT SHOULDER ROTATOR CUFF TEAR REPAIR, ARTHROSCOPY, DEBRIDEMENT, BICEPS TENODESIS, SUBACROMIAL DECOMPRESSION.  . TOTAL ABDOMINAL HYSTERECTOMY    . TUBAL  LIGATION      There were no vitals filed for this visit.      Subjective Assessment - 03/21/17 1504    Subjective Patient reports she feels like her shoulder is better. When she bends over she still can have a sharp pain in her shoulder that can be about an 8/10.    Pertinent History left rotator cuff surgery    Limitations House hold activities;Lifting   Diagnostic tests Nothing taken at this time    Patient Stated Goals to have no pain in her shoulder    Currently in Pain? No/denies                         [facility] Adult PT Treatment/Exercise - 03/22/17 0001      Shoulder Exercises: Supine   Other Supine Exercises supine wand flexion 2x10; supine ER 2x10;     Shoulder Exercises: Standing   Other Standing Exercises upper extremity ranger 2x10; standing extension 2x10 yellow; scap retraction yellow 2x10      Manual Therapy   Manual therapy comments PROM into all planes; grade I and II PA glides and AP glides.  Light cervical distraction; trigger point release to left upper trap                 PT Education - 03/21/17 1507    Education  provided Yes   Education Details continue with current exercises    Person(s) Educated Patient   Methods Explanation;Demonstration;Tactile cues;Verbal cues   Comprehension Verbalized understanding;Returned demonstration          PT Short Term Goals - 03/22/17 1233      PT SHORT TERM GOAL #1   Title Patient will increase passive flexion by 25 degrees   Time 4   Period Weeks   Status On-going     PT SHORT TERM GOAL #2   Title Patient will demonstrate full pain free external rotation    Time 4   Period Weeks   Status On-going     PT SHORT TERM GOAL #3   Title Patient will demsotrate 5/5 gross left shoulder strength    Time 4   Period Weeks   Status On-going     PT SHORT TERM GOAL #4   Title Patient will be independent with initial HEP    Time 4   Period Weeks   Status On-going           PT Long  Term Goals - 03/07/17 1605      PT LONG TERM GOAL #1   Title Patient will reach down and pick object off the ground without increased pain in her left arm and biceps    Time 8   Period Weeks   Status New     PT LONG TERM GOAL #2   Title Patient will demsotrate a 29% deficit on FOTO    Time 8   Period Weeks   Status New               Plan - 03/21/17 1509    Clinical Impression Statement Therapy had the patient reach down for an item on the ground. She arched her neck and felt pain. She had a negative spurlings and a negative compresion test. She dod have some psiming of her uper trap.  Therapy work on her upper trap and PROM    PT Frequency 2x / week   PT Duration 8 weeks   PT Treatment/Interventions ADLs/Self Care Home Management;Cryotherapy;Electrical Stimulation;Iontophoresis 4mg /ml Dexamethasone;Moist Heat;Traction;Ultrasound;Gait training;Stair training;Therapeutic activities;Therapeutic exercise;Patient/family education;Manual techniques;Dry needling;Passive range of motion;Taping;Splinting   PT Next Visit Plan consider adding yellow band ER/IR, continue with PROM, Continue with low grade mobilizations for pain; consdier supine ABC for stabilization, Eval for carpla tunnel when script recieved       Patient will benefit from skilled therapeutic intervention in order to improve the following deficits and impairments:  Decreased activity tolerance, Decreased strength, Decreased endurance, Decreased safety awareness, Increased fascial restricitons, Decreased range of motion, Impaired UE functional use  Visit Diagnosis: Acute pain of left shoulder  Stiffness of left shoulder, not elsewhere classified  Muscle weakness (generalized)     Problem List Patient Active Problem List   Diagnosis Date Noted  . Wound infection after surgery 01/30/2017  . Palpitations 08/24/2016  . Noncompliance with CPAP treatment 12/28/2015  . Poor compliance with CPAP treatment 10/26/2014   . CPAP use counseling 07/28/2014  . OSA on CPAP 07/28/2014  . 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 Hypersomnolent 04/02/2014  . Obstructive sleep apnea 04/02/2014  . Non-compliance with treatment 03/05/2014  . Diabetes mellitus 12/05/2011  . Hypertension 12/05/2011  . GERD (gastroesophageal reflux disease)   . 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 Chronic cough 07/26/2011    Carney Living PT DPT  03/22/2017, 12:35 PM  [facility]  [place], [place], [address] Phone: [phone]   Fax:  [phone]  Name: LEIGHANA NEYMAN MRN: [id number] Date of Birth: 1940-12-14

## 2017-03-23 ENCOUNTER — Ambulatory Visit: Payer: Medicare Other | Admitting: Physical Therapy

## 2017-03-23 DIAGNOSIS — M25612 Stiffness of left shoulder, not elsewhere classified: Secondary | ICD-10-CM

## 2017-03-23 DIAGNOSIS — M25512 Pain in left shoulder: Secondary | ICD-10-CM

## 2017-03-23 DIAGNOSIS — M6281 Muscle weakness (generalized): Secondary | ICD-10-CM | POA: Diagnosis not present

## 2017-03-23 DIAGNOSIS — M25631 Stiffness of right wrist, not elsewhere classified: Secondary | ICD-10-CM | POA: Diagnosis not present

## 2017-03-23 DIAGNOSIS — M25541 Pain in joints of right hand: Secondary | ICD-10-CM | POA: Diagnosis not present

## 2017-03-23 DIAGNOSIS — M25641 Stiffness of right hand, not elsewhere classified: Secondary | ICD-10-CM | POA: Diagnosis not present

## 2017-03-23 NOTE — Therapy (Signed)
[place], [place], [address] Phone: [phone]   Fax:  [phone]  Physical Therapy Treatment  Patient Details  Name: Stephanie Barber MRN: [id number] Date of Birth: January 10, 1941 Referring Provider: Dr Damaris Hippo   Encounter Date: 03/23/2017      PT End of Session - 03/23/17 1038    Visit Number 5   Number of Visits 16   Date for PT Re-Evaluation 05/03/17   Authorization Type medicare    PT Start Time 1015   PT Stop Time 1057   PT Time Calculation (min) 42 min   Activity Tolerance Patient tolerated treatment well   Behavior During Therapy [facility] for tasks assessed/performed      Past Medical History:  Diagnosis Date  . Arthritis   . Chronic headache   . Complication of anesthesia    allergy to Novocaine   . Diabetes mellitus   . GERD (gastroesophageal reflux disease)   . Heart murmur   . High cholesterol   . Hyperlipidemia   . Hypertension   . Intracranial atherosclerosis    per MRA  . Migraine   . Narcolepsy and cataplexy   . OSA on CPAP    residual AHI of-1.1, CPAPevery night   . Rectal prolapse     Past Surgical History:  Procedure Laterality Date  . ABDOMINAL HYSTERECTOMY    . BACK SURGERY     L4,L5 discectomy  . CARDIAC CATHETERIZATION    . CARPAL TUNNEL RELEASE Right 01/18/2017  . KIDNEY SURGERY  1998   Right.  growth removed  . rectal prolapse repair  12/05/11  . ROTATOR CUFF REPAIR     Left  . SHOULDER ARTHROSCOPY WITH SUBACROMIAL DECOMPRESSION, ROTATOR CUFF REPAIR AND BICEP TENDON REPAIR Left 05/25/2015   Procedure: SHOULDER ARTHROSCOPY WITH SUBACROMIAL DECOMPRESSION, ROTATOR CUFF REPAIR AND BICEP TENODESIS, DEBRIDEMENT. ;  Surgeon: Meredith Pel, MD;  Location: [place];  Service: Orthopedics;  Laterality: Left;  LEFT SHOULDER ROTATOR CUFF TEAR REPAIR, ARTHROSCOPY, DEBRIDEMENT, BICEPS TENODESIS, SUBACROMIAL DECOMPRESSION.  . TOTAL ABDOMINAL HYSTERECTOMY    . TUBAL  LIGATION      There were no vitals filed for this visit.      Subjective Assessment - 03/23/17 1018    Subjective Patient reports agfter the last session she was afeeling a little better. She continues to have pain in her shoulder when she bends down and reaches.    Pertinent History left rotator cuff surgery    Limitations House hold activities;Lifting   Diagnostic tests Nothing taken at this time    Patient Stated Goals to have no pain in her shoulder    Currently in Pain? Yes   Pain Score 8    Pain Location Shoulder   Pain Orientation Right   Pain Descriptors / Indicators Burning;Shooting   Pain Type Chronic pain   Pain Onset More than a month ago   Pain Frequency Constant   Aggravating Factors  reaching and bending    Pain Relieving Factors not reaching    Effect of Pain on Daily Activities limits use of the right hand    Multiple Pain Sites No                         [facility] Adult PT Treatment/Exercise - 03/23/17 0001      Shoulder Exercises: Supine   Other Supine Exercises supine wand flexion 2x10; supine ER 2x10;     Shoulder Exercises: Standing  Other Standing Exercises upper extremity ranger 2x10; standing extension 2x10 yellow; scap retraction yellow 2x10      Manual Therapy   Manual therapy comments PROM into all planes; grade I and II PA glides and AP glides.  Light cervical distraction; trigger point release to left upper trap                 PT Education - 03/23/17 1021    Education provided (P)  Yes   Person(s) Educated (P)  Patient   Methods (P)  Explanation;Demonstration;Tactile cues;Verbal cues   Comprehension (P)  Verbalized understanding;Returned demonstration;Verbal cues required;Tactile cues required          PT Short Term Goals - 03/22/17 1233      PT SHORT TERM GOAL #1   Title Patient will increase passive flexion by 25 degrees   Time 4   Period Weeks   Status On-going     PT SHORT TERM GOAL #2   Title Patient  will demonstrate full pain free external rotation    Time 4   Period Weeks   Status On-going     PT SHORT TERM GOAL #3   Title Patient will demsotrate 5/5 gross left shoulder strength    Time 4   Period Weeks   Status On-going     PT SHORT TERM GOAL #4   Title Patient will be independent with initial HEP    Time 4   Period Weeks   Status On-going           PT Long Term Goals - 03/07/17 1605      PT LONG TERM GOAL #1   Title Patient will reach down and pick object off the ground without increased pain in her left arm and biceps    Time 8   Period Weeks   Status New     PT LONG TERM GOAL #2   Title Patient will demsotrate a 29% deficit on FOTO    Time 8   Period Weeks   Status New               Plan - 03/23/17 1151    Clinical Impression Statement Patient appears to be making progress. She has near full end range flexion and ER without any pain. Decreased upper trap spasm today. Continued cuing required for technuqe with therex.    PT Frequency 2x / week   PT Duration 8 weeks   PT Treatment/Interventions ADLs/Self Care Home Management;Cryotherapy;Electrical Stimulation;Iontophoresis 4mg /ml Dexamethasone;Moist Heat;Traction;Ultrasound;Gait training;Stair training;Therapeutic activities;Therapeutic exercise;Patient/family education;Manual techniques;Dry needling;Passive range of motion;Taping;Splinting   PT Next Visit Plan consider adding yellow band ER/IR, continue with PROM, Continue with low grade mobilizations for pain; consdier supine ABC for stabilization, Eval for carpla tunnel when script recieved    PT Home Exercise Plan wand flexion, wand ER, scap retraction    Consulted and Agree with Plan of Care Patient      Patient will benefit from skilled therapeutic intervention in order to improve the following deficits and impairments:     Visit Diagnosis: Muscle weakness (generalized)  Acute pain of left shoulder  Stiffness of left shoulder, not elsewhere  classified     Problem List Patient Active Problem List   Diagnosis Date Noted  . Wound infection after surgery 01/30/2017  . Palpitations 08/24/2016  . Noncompliance with CPAP treatment 12/28/2015  . Poor compliance with CPAP treatment 10/26/2014  . CPAP use counseling 07/28/2014  . OSA on CPAP 07/28/2014  . Chronic ethmoidal sinusitis  07/28/2014  . Hypersomnolent 04/02/2014  . Obstructive sleep apnea 04/02/2014  . Non-compliance with treatment 03/05/2014  . Diabetes mellitus 12/05/2011  . Hypertension 12/05/2011  . GERD (gastroesophageal reflux disease)   . 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 Chronic cough 07/26/2011    Carney Living PT DPT  03/23/2017, 11:54 AM  [facility] [address] [place], [place], [address] Phone: [phone]   Fax:  [phone]  Name: Stephanie Barber MRN: [id number] Date of Birth: [birth year]

## 2017-03-26 ENCOUNTER — Ambulatory Visit: Payer: Medicare Other | Admitting: Physical Therapy

## 2017-03-26 DIAGNOSIS — M25541 Pain in joints of right hand: Secondary | ICD-10-CM | POA: Diagnosis not present

## 2017-03-26 DIAGNOSIS — M25512 Pain in left shoulder: Secondary | ICD-10-CM | POA: Diagnosis not present

## 2017-03-26 DIAGNOSIS — M6281 Muscle weakness (generalized): Secondary | ICD-10-CM | POA: Diagnosis not present

## 2017-03-26 DIAGNOSIS — M25641 Stiffness of right hand, not elsewhere classified: Secondary | ICD-10-CM | POA: Diagnosis not present

## 2017-03-26 DIAGNOSIS — M25612 Stiffness of left shoulder, not elsewhere classified: Secondary | ICD-10-CM

## 2017-03-26 DIAGNOSIS — M25631 Stiffness of right wrist, not elsewhere classified: Secondary | ICD-10-CM | POA: Diagnosis not present

## 2017-03-26 NOTE — Therapy (Signed)
[place], [place], [address] Phone: [phone]   Fax:  [phone]  Physical Therapy Treatment  Patient Details  Name: Stephanie Barber MRN: [id number] Date of Birth: October 01, 1941 Referring Provider: Dr Damaris Hippo   Encounter Date: 03/26/2017      PT End of Session - 03/26/17 1552    Visit Number 6   Number of Visits 16   Date for PT Re-Evaluation 05/03/17   Authorization Type medicare    PT Start Time 1503   PT Stop Time 1542   PT Time Calculation (min) 39 min   Activity Tolerance Patient tolerated treatment well   Behavior During Therapy [facility] for tasks assessed/performed      Past Medical History:  Diagnosis Date  . Arthritis   . Chronic headache   . Complication of anesthesia    allergy to Novocaine   . Diabetes mellitus   . GERD (gastroesophageal reflux disease)   . Heart murmur   . High cholesterol   . Hyperlipidemia   . Hypertension   . Intracranial atherosclerosis    per MRA  . Migraine   . Narcolepsy and cataplexy   . OSA on CPAP    residual AHI of-1.1, CPAPevery night   . Rectal prolapse     Past Surgical History:  Procedure Laterality Date  . ABDOMINAL HYSTERECTOMY    . BACK SURGERY     L4,L5 discectomy  . CARDIAC CATHETERIZATION    . CARPAL TUNNEL RELEASE Right 01/18/2017  . KIDNEY SURGERY  1998   Right.  growth removed  . rectal prolapse repair  12/05/11  . ROTATOR CUFF REPAIR     Left  . SHOULDER ARTHROSCOPY WITH SUBACROMIAL DECOMPRESSION, ROTATOR CUFF REPAIR AND BICEP TENDON REPAIR Left 05/25/2015   Procedure: SHOULDER ARTHROSCOPY WITH SUBACROMIAL DECOMPRESSION, ROTATOR CUFF REPAIR AND BICEP TENODESIS, DEBRIDEMENT. ;  Surgeon: Meredith Pel, MD;  Location: [place];  Service: Orthopedics;  Laterality: Left;  LEFT SHOULDER ROTATOR CUFF TEAR REPAIR, ARTHROSCOPY, DEBRIDEMENT, BICEPS TENODESIS, SUBACROMIAL DECOMPRESSION.  . TOTAL ABDOMINAL HYSTERECTOMY    . TUBAL  LIGATION      There were no vitals filed for this visit.      Subjective Assessment - 03/26/17 1549    Subjective Patient reports she is getting better. She is having less pain when she is reaching forward. She has been doing her exercises.    Pertinent History left rotator cuff surgery    Limitations House hold activities;Lifting   Diagnostic tests Nothing taken at this time    Patient Stated Goals to have no pain in her shoulder    Currently in Pain? No/denies                         [facility] Adult PT Treatment/Exercise - 03/26/17 0001      Shoulder Exercises: Sidelying   Other Sidelying Exercises sidelying ER 2x10      Shoulder Exercises: Standing   External Rotation --  2x10    Theraband Level (Shoulder External Rotation) Level 2 (Red)   Internal Rotation Limitations 2x10 red    Other Standing Exercises upper extremity ranger 2x10; standing extension 2x10 red; scap retraction red 2x10      Manual Therapy   Manual therapy comments PROM into all planes; grade I and II PA glides and AP glides.  Light cervical distraction; trigger point release to left upper trap  PT Education - 03/26/17 1551    Education provided Yes   Education Details continue with stretching and strengthening    Person(s) Educated Patient   Methods Explanation;Demonstration;Tactile cues;Handout   Comprehension Verbalized understanding;Returned demonstration;Verbal cues required;Need further instruction          PT Short Term Goals - 03/26/17 1605      PT SHORT TERM GOAL #1   Title Patient will increase passive flexion by 25 degrees   Baseline full passive ROM    Time 4   Period Weeks   Status Achieved     PT SHORT TERM GOAL #2   Title Patient will demonstrate full pain free external rotation    Time 4   Period Weeks   Status Achieved     PT SHORT TERM GOAL #3   Title Patient will demsotrate 5/5 gross left shoulder strength    Baseline working on  strengthening    Time 4   Period Weeks   Status On-going     PT SHORT TERM GOAL #4   Title Patient will be independent with initial HEP    Time 4   Period Weeks   Status On-going           PT Long Term Goals - 03/07/17 1605      PT LONG TERM GOAL #1   Title Patient will reach down and pick object off the ground without increased pain in her left arm and biceps    Time 8   Period Weeks   Status New     PT LONG TERM GOAL #2   Title Patient will demsotrate a 29% deficit on FOTO    Time 8   Period Weeks   Status New               Plan - 03/26/17 1604    Clinical Impression Statement Patient is making great progress. Therapy has been able to progress exercises without difficulty. She had no spasming in her neck today and had full shoulder ROM without pain.    Rehab Potential Good   PT Frequency 2x / week   PT Duration 8 weeks   PT Treatment/Interventions ADLs/Self Care Home Management;Cryotherapy;Electrical Stimulation;Iontophoresis 4mg /ml Dexamethasone;Moist Heat;Traction;Ultrasound;Gait training;Stair training;Therapeutic activities;Therapeutic exercise;Patient/family education;Manual techniques;Dry needling;Passive range of motion;Taping;Splinting   PT Next Visit Plan consider adding yellow band ER/IR, continue with PROM, Continue with low grade mobilizations for pain; consdier supine ABC for stabilization, Eval for carpla tunnel when script recieved    PT Home Exercise Plan wand flexion, wand ER, scap retraction    Consulted and Agree with Plan of Care Patient      Patient will benefit from skilled therapeutic intervention in order to improve the following deficits and impairments:  Decreased activity tolerance, Decreased strength, Decreased endurance, Decreased safety awareness, Increased fascial restricitons, Decreased range of motion, Impaired UE functional use  Visit Diagnosis: Muscle weakness (generalized)  Acute pain of left shoulder  Stiffness of left  shoulder, not elsewhere classified     Problem List Patient Active Problem List   Diagnosis Date Noted  . Wound infection after surgery 01/30/2017  . Palpitations 08/24/2016  . Noncompliance with CPAP treatment 12/28/2015  . Poor compliance with CPAP treatment 10/26/2014  . CPAP use counseling 07/28/2014  . OSA on CPAP 07/28/2014  . 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 Hypersomnolent 04/02/2014  . Obstructive sleep apnea 04/02/2014  . Non-compliance with treatment 03/05/2014  . Diabetes mellitus 12/05/2011  . Hypertension 12/05/2011  . GERD (  gastroesophageal reflux disease)   . 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 Chronic cough 07/26/2011    Carney Living PT DPT  03/26/2017, 4:10 PM  [facility] [address] [place], [place], [address] Phone: [phone]   Fax:  [phone]  Name: Stephanie Barber MRN: [id number] Date of Birth: 20-Apr-1941

## 2017-03-27 ENCOUNTER — Encounter: Payer: Medicare Other | Admitting: Occupational Therapy

## 2017-03-28 ENCOUNTER — Ambulatory Visit: Payer: Medicare Other | Admitting: Physical Therapy

## 2017-03-28 ENCOUNTER — Encounter: Payer: Self-pay | Admitting: Physical Therapy

## 2017-03-28 ENCOUNTER — Ambulatory Visit: Payer: Medicare Other | Admitting: Occupational Therapy

## 2017-03-28 DIAGNOSIS — M25512 Pain in left shoulder: Secondary | ICD-10-CM | POA: Diagnosis not present

## 2017-03-28 DIAGNOSIS — M25612 Stiffness of left shoulder, not elsewhere classified: Secondary | ICD-10-CM | POA: Diagnosis not present

## 2017-03-28 DIAGNOSIS — M6281 Muscle weakness (generalized): Secondary | ICD-10-CM

## 2017-03-28 DIAGNOSIS — M25631 Stiffness of right wrist, not elsewhere classified: Secondary | ICD-10-CM | POA: Diagnosis not present

## 2017-03-28 DIAGNOSIS — M25641 Stiffness of right hand, not elsewhere classified: Secondary | ICD-10-CM | POA: Diagnosis not present

## 2017-03-28 DIAGNOSIS — M25541 Pain in joints of right hand: Secondary | ICD-10-CM | POA: Diagnosis not present

## 2017-03-28 NOTE — Therapy (Signed)
[place], [place], [address] Phone: [phone]   Fax:  [phone]  Physical Therapy Treatment  Patient Details  Name: Stephanie Barber MRN: [id number] Date of Birth: [birth year] Referring Provider: Dr Damaris Hippo   Encounter Date: 03/28/2017      PT End of Session - 03/28/17 1525    Visit Number 7   Number of Visits 16   Date for PT Re-Evaluation 05/03/17   Authorization Type medicare    PT Start Time 1415   PT Stop Time 1456   PT Time Calculation (min) 41 min   Activity Tolerance Patient tolerated treatment well   Behavior During Therapy [facility] for tasks assessed/performed      Past Medical History:  Diagnosis Date  . Arthritis   . Chronic headache   . Complication of anesthesia    allergy to Novocaine   . Diabetes mellitus   . GERD (gastroesophageal reflux disease)   . Heart murmur   . High cholesterol   . Hyperlipidemia   . Hypertension   . Intracranial atherosclerosis    per MRA  . Migraine   . Narcolepsy and cataplexy   . OSA on CPAP    residual AHI of-1.1, CPAPevery night   . Rectal prolapse     Past Surgical History:  Procedure Laterality Date  . ABDOMINAL HYSTERECTOMY    . BACK SURGERY     L4,L5 discectomy  . CARDIAC CATHETERIZATION    . CARPAL TUNNEL RELEASE Right 01/18/2017  . KIDNEY SURGERY  1998   Right.  growth removed  . rectal prolapse repair  12/05/11  . ROTATOR CUFF REPAIR     Left  . SHOULDER ARTHROSCOPY WITH SUBACROMIAL DECOMPRESSION, ROTATOR CUFF REPAIR AND BICEP TENDON REPAIR Left 05/25/2015   Procedure: SHOULDER ARTHROSCOPY WITH SUBACROMIAL DECOMPRESSION, ROTATOR CUFF REPAIR AND BICEP TENODESIS, DEBRIDEMENT. ;  Surgeon: Meredith Pel, MD;  Location: [place];  Service: Orthopedics;  Laterality: Left;  LEFT SHOULDER ROTATOR CUFF TEAR REPAIR, ARTHROSCOPY, DEBRIDEMENT, BICEPS TENODESIS, SUBACROMIAL DECOMPRESSION.  . TOTAL ABDOMINAL HYSTERECTOMY    . TUBAL  LIGATION      There were no vitals filed for this visit.      Subjective Assessment - 03/28/17 1523    Subjective Patient reports just one incedent of pain yesterday were she flet a sharp pain. She otherwise has had very little pain.    Pertinent History left rotator cuff surgery    Limitations House hold activities;Lifting   Diagnostic tests Nothing taken at this time    Patient Stated Goals to have no pain in her shoulder    Currently in Pain? No/denies   Pain Location Shoulder   Pain Frequency Constant   Aggravating Factors  reaching and bending    Pain Relieving Factors not reaching                          [facility] Adult PT Treatment/Exercise - 03/28/17 0001      Shoulder Exercises: Sidelying   Other Sidelying Exercises sidelying ER 2x10 1 lb      Shoulder Exercises: Standing   External Rotation --  2x10    Theraband Level (Shoulder External Rotation) Level 2 (Red)   Internal Rotation Limitations 2x10 red    Other Standing Exercises upper extremity ranger 2x10; standing extension 2x10 red; scap retraction red 2x10 Standing shoulder flexion 2x10 2lb      Manual Therapy   Manual therapy comments  PROM into all planes; grade I and II PA glides and AP glides.  Light cervical distraction; trigger point release to left upper trap                 PT Education - 03/28/17 1525    Education provided Yes   Education Details continue with stretching and strengthening    Person(s) Educated Patient   Methods Explanation;Demonstration;Tactile cues   Comprehension Verbalized understanding;Returned demonstration;Verbal cues required;Tactile cues required          PT Short Term Goals - 03/26/17 1605      PT SHORT TERM GOAL #1   Title Patient will increase passive flexion by 25 degrees   Baseline full passive ROM    Time 4   Period Weeks   Status Achieved     PT SHORT TERM GOAL #2   Title Patient will demonstrate full pain free external rotation     Time 4   Period Weeks   Status Achieved     PT SHORT TERM GOAL #3   Title Patient will demsotrate 5/5 gross left shoulder strength    Baseline working on strengthening    Time 4   Period Weeks   Status On-going     PT SHORT TERM GOAL #4   Title Patient will be independent with initial HEP    Time 4   Period Weeks   Status On-going           PT Long Term Goals - 03/07/17 1605      PT LONG TERM GOAL #1   Title Patient will reach down and pick object off the ground without increased pain in her left arm and biceps    Time 8   Period Weeks   Status New     PT LONG TERM GOAL #2   Title Patient will demsotrate a 29% deficit on FOTO    Time 8   Period Weeks   Status New               Plan - 03/28/17 1526    Clinical Impression Statement Patient continues to make great progress. She is only having pain every once in a while when she reaches. She will have a week to work on her exercises. Therapy wiill look    Rehab Potential Good   PT Frequency 2x / week   PT Duration 8 weeks   PT Treatment/Interventions ADLs/Self Care Home Management;Cryotherapy;Electrical Stimulation;Iontophoresis 4mg /ml Dexamethasone;Moist Heat;Traction;Ultrasound;Gait training;Stair training;Therapeutic activities;Therapeutic exercise;Patient/family education;Manual techniques;Dry needling;Passive range of motion;Taping;Splinting   PT Next Visit Plan consider adding yellow band ER/IR, continue with PROM, Continue with low grade mobilizations for pain; consdier supine ABC for stabilization, Eval for carpla tunnel when script recieved    PT Home Exercise Plan wand flexion, wand ER, scap retraction    Consulted and Agree with Plan of Care Patient      Patient will benefit from skilled therapeutic intervention in order to improve the following deficits and impairments:  Decreased activity tolerance, Decreased strength, Decreased endurance, Decreased safety awareness, Increased fascial restricitons,  Decreased range of motion, Impaired UE functional use  Visit Diagnosis: Muscle weakness (generalized)  Acute pain of left shoulder  Stiffness of left shoulder, not elsewhere classified     Problem List Patient Active Problem List   Diagnosis Date Noted  . Wound infection after surgery 01/30/2017  . Palpitations 08/24/2016  . Noncompliance with CPAP treatment 12/28/2015  . Poor compliance with CPAP treatment 10/26/2014  . CPAP  use counseling 07/28/2014  . OSA on CPAP 07/28/2014  . 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 Hypersomnolent 04/02/2014  . Obstructive sleep apnea 04/02/2014  . Non-compliance with treatment 03/05/2014  . Diabetes mellitus 12/05/2011  . Hypertension 12/05/2011  . GERD (gastroesophageal reflux disease)   . 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 Chronic cough 07/26/2011    Carney Living PT DPT  03/28/2017, 4:34 PM  [facility] [address] [place], [place], [address] Phone: [phone]   Fax:  [phone]  Name: Stephanie Barber MRN: [id number] Date of Birth: 1940-12-07

## 2017-04-05 ENCOUNTER — Encounter: Payer: Self-pay | Admitting: Physical Therapy

## 2017-04-05 ENCOUNTER — Encounter: Payer: Self-pay | Admitting: Occupational Therapy

## 2017-04-05 ENCOUNTER — Ambulatory Visit: Payer: Medicare Other | Admitting: Physical Therapy

## 2017-04-05 ENCOUNTER — Ambulatory Visit: Payer: Medicare Other | Admitting: Occupational Therapy

## 2017-04-05 DIAGNOSIS — M25612 Stiffness of left shoulder, not elsewhere classified: Secondary | ICD-10-CM | POA: Diagnosis not present

## 2017-04-05 DIAGNOSIS — M6281 Muscle weakness (generalized): Secondary | ICD-10-CM | POA: Diagnosis not present

## 2017-04-05 DIAGNOSIS — M25512 Pain in left shoulder: Secondary | ICD-10-CM

## 2017-04-05 DIAGNOSIS — R278 Other lack of coordination: Secondary | ICD-10-CM

## 2017-04-05 DIAGNOSIS — M25541 Pain in joints of right hand: Secondary | ICD-10-CM

## 2017-04-05 DIAGNOSIS — M25631 Stiffness of right wrist, not elsewhere classified: Secondary | ICD-10-CM | POA: Diagnosis not present

## 2017-04-05 DIAGNOSIS — M25531 Pain in right wrist: Secondary | ICD-10-CM

## 2017-04-05 DIAGNOSIS — R208 Other disturbances of skin sensation: Secondary | ICD-10-CM

## 2017-04-05 DIAGNOSIS — M25641 Stiffness of right hand, not elsewhere classified: Secondary | ICD-10-CM | POA: Diagnosis not present

## 2017-04-05 NOTE — Therapy (Addendum)
[place], [place], [address] Phone: [phone]   Fax:  [phone]  Physical Therapy Treatment/ Discharge   Patient Details  Name: Stephanie Barber MRN: [id number] Date of Birth: 1941/04/30 Referring Provider: Dr Damaris Hippo   Encounter Date: 04/05/2017      PT End of Session - 04/05/17 1411    Visit Number 8   Number of Visits 16   Date for PT Re-Evaluation 05/03/17   PT Start Time 1410   PT Stop Time 1450   PT Time Calculation (min) 40 min   Activity Tolerance Patient tolerated treatment well   Behavior During Therapy [facility] for tasks assessed/performed      Past Medical History:  Diagnosis Date  . Arthritis   . Chronic headache   . Complication of anesthesia    allergy to Novocaine   . Diabetes mellitus   . GERD (gastroesophageal reflux disease)   . Heart murmur   . High cholesterol   . Hyperlipidemia   . Hypertension   . Intracranial atherosclerosis    per MRA  . Migraine   . Narcolepsy and cataplexy   . OSA on CPAP    residual AHI of-1.1, CPAPevery night   . Rectal prolapse     Past Surgical History:  Procedure Laterality Date  . ABDOMINAL HYSTERECTOMY    . BACK SURGERY     L4,L5 discectomy  . CARDIAC CATHETERIZATION    . CARPAL TUNNEL RELEASE Right 01/18/2017  . KIDNEY SURGERY  1998   Right.  growth removed  . rectal prolapse repair  12/05/11  . ROTATOR CUFF REPAIR     Left  . SHOULDER ARTHROSCOPY WITH SUBACROMIAL DECOMPRESSION, ROTATOR CUFF REPAIR AND BICEP TENDON REPAIR Left 05/25/2015   Procedure: SHOULDER ARTHROSCOPY WITH SUBACROMIAL DECOMPRESSION, ROTATOR CUFF REPAIR AND BICEP TENODESIS, DEBRIDEMENT. ;  Surgeon: Meredith Pel, MD;  Location: [facility];  Service: Orthopedics;  Laterality: Left;  LEFT SHOULDER ROTATOR CUFF TEAR REPAIR, ARTHROSCOPY, DEBRIDEMENT, BICEPS TENODESIS, SUBACROMIAL DECOMPRESSION.  . TOTAL ABDOMINAL HYSTERECTOMY    . TUBAL LIGATION      There  were no vitals filed for this visit.      Subjective Assessment - 04/05/17 1408    Subjective Patient reports that she has only had 1-2 episodes over the past week were she has had pain. She is otherwise having no pain. She has been able to do her exercises without difficulty.    Pertinent History left rotator cuff surgery    Limitations House hold activities;Lifting   Diagnostic tests Nothing taken at this time    Patient Stated Goals to have no pain in her shoulder    Currently in Pain? No/denies            [facility] PT Assessment - 04/05/17 0001      AROM   Left Shoulder Flexion 157 Degrees   Left Shoulder ABduction 120 Degrees   Left Shoulder Internal Rotation --  to t-10   Left Shoulder External Rotation --  able to reach behind her head without significant pain      PROM   Overall PROM Comments Full passive shoulder motion     Strength   Left Shoulder Flexion 5/5   Left Shoulder ABduction 5/5   Left Shoulder Internal Rotation 5/5   Left Shoulder External Rotation 5/5     Hawkins-Kennedy test   Comments (-)     Speeds test   Comment (-)  [facility] PT Treatment/Exercise - 04/05/17 0001      Shoulder Exercises: Supine   Other Supine Exercises supine d2 flexion and supine flexion 2x10      Shoulder Exercises: Sidelying   Other Sidelying Exercises sidelying ER 2x10 1 lb      Shoulder Exercises: Standing   Theraband Level (Shoulder External Rotation) Level 2 (Red)   Internal Rotation Limitations 2x10 red    Other Standing Exercises upper extremity ranger 2x10; standing extension 2x10 red; scap retraction red 2x10 Standing shoulder flexion 2x10 2lb      Manual Therapy   Manual therapy comments PROM into all planes; grade I and II PA glides and AP glides.  Light cervical distraction; trigger point release to left upper trap                 PT Education - 04/05/17 1410    Education provided Yes   Education Details  continue with stretching and strengthening    Person(s) Educated Patient   Methods Explanation;Demonstration;Tactile cues   Comprehension Verbalized understanding;Returned demonstration;Tactile cues required;Verbal cues required          PT Short Term Goals - 04/05/17 1421      PT SHORT TERM GOAL #1   Title Patient will increase passive flexion by 25 degrees   Baseline full passive ROM    Time 4   Period Weeks   Status Achieved     PT SHORT TERM GOAL #2   Title Patient will demonstrate full pain free external rotation    Baseline Full pain free    Time 4   Period Weeks   Status Achieved     PT SHORT TERM GOAL #3   Title Patient will demsotrate 5/5 gross left shoulder strength    Baseline 5/5 bilateral UE strength    Time 4   Period Weeks   Status Achieved     PT SHORT TERM GOAL #4   Title Patient will be independent with initial HEP    Time 4   Period Weeks   Status Achieved           PT Long Term Goals - 04/05/17 1532      PT LONG TERM GOAL #1   Title Patient will reach down and pick object off the ground without increased pain in her left arm and biceps    Baseline No pain when reaching    Time 8   Period Weeks   Status Achieved     PT LONG TERM GOAL #2   Title Patient will demsotrate a 29% deficit on FOTO    Baseline 21% limitation    Time 8   Period Weeks   Status Achieved               Plan - 04/05/17 1412    Clinical Impression Statement Patient has made great progress. She has reached her goals for therapy. She will go on vaction. If she returns without pain she will likley be discharged. Her case will be left open in case she has pain with her travels. She has a full HEP.    PT Frequency 2x / week   PT Duration 8 weeks   PT Treatment/Interventions ADLs/Self Care Home Management;Cryotherapy;Electrical Stimulation;Iontophoresis 3m/ml Dexamethasone;Moist Heat;Traction;Ultrasound;Gait training;Stair training;Therapeutic  activities;Therapeutic exercise;Patient/family education;Manual techniques;Dry needling;Passive range of motion;Taping;Splinting   PT Next Visit Plan consider adding yellow band ER/IR, continue with PROM, Continue with low grade mobilizations for pain; consdier supine ABC for stabilization, Eval for  carpla tunnel when script recieved    PT Home Exercise Plan wand flexion, wand ER, scap retraction    Consulted and Agree with Plan of Care Patient      Patient will benefit from skilled therapeutic intervention in order to improve the following deficits and impairments:     Visit Diagnosis: Muscle weakness (generalized)  Acute pain of left shoulder  Stiffness of left shoulder, not elsewhere classified  PHYSICAL THERAPY DISCHARGE SUMMARY  Visits from Start of Care:8  Current functional level related to goals / functional outcomes: Improved shoulder pain    Remaining deficits: Unknown    Education / Equipment: HEP Plan: Patient agrees to discharge.  Patient goals were not met. Patient is being discharged due to not returning since the last visit.  ?????       Problem List Patient Active Problem List   Diagnosis Date Noted  . Wound infection after surgery 01/30/2017  . Palpitations 08/24/2016  . Noncompliance with CPAP treatment 12/28/2015  . Poor compliance with CPAP treatment 10/26/2014  . CPAP use counseling 07/28/2014  . OSA on CPAP 07/28/2014  . 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 Hypersomnolent 04/02/2014  . Obstructive sleep apnea 04/02/2014  . Non-compliance with treatment 03/05/2014  . Diabetes mellitus 12/05/2011  . Hypertension 12/05/2011  . GERD (gastroesophageal reflux disease)   . 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 Chronic cough 07/26/2011    Carney Living PT DPT  04/05/2017, 3:33 PM  [facility] [address] [place], [place], [address] Phone: [phone]   Fax:   [phone]  Name: Stephanie Barber MRN: [id number] Date of Birth: 1941-05-07

## 2017-04-05 NOTE — Therapy (Signed)
[facility] [address] [place], [place], [address] Phone: [phone]   Fax:  [phone]  Occupational Therapy Treatment  Patient Details  Name: Stephanie Barber MRN: [id number] Date of Birth: 1940/12/21 Referring Provider: Dr Christella Noa  Encounter Date: 04/05/2017      OT End of Session - 04/05/17 1622    Visit Number 2   Number of Visits 16   Date for OT Re-Evaluation 05/20/17   Authorization Type mcare trad- primary   Authorization Time Period gcode and pn every 10th visit   Authorization - Visit Number 2   Authorization - Number of Visits 10   OT Start Time 1535   OT Stop Time 1614   OT Time Calculation (min) 39 min   Activity Tolerance Patient tolerated treatment well   Behavior During Therapy [facility] for tasks assessed/performed      Past Medical History:  Diagnosis Date  . Arthritis   . Chronic headache   . Complication of anesthesia    allergy to Novocaine   . Diabetes mellitus   . GERD (gastroesophageal reflux disease)   . Heart murmur   . High cholesterol   . Hyperlipidemia   . Hypertension   . Intracranial atherosclerosis    per MRA  . Migraine   . Narcolepsy and cataplexy   . OSA on CPAP    residual AHI of-1.1, CPAPevery night   . Rectal prolapse     Past Surgical History:  Procedure Laterality Date  . ABDOMINAL HYSTERECTOMY    . BACK SURGERY     L4,L5 discectomy  . CARDIAC CATHETERIZATION    . CARPAL TUNNEL RELEASE Right 01/18/2017  . KIDNEY SURGERY  1998   Right.  growth removed  . rectal prolapse repair  12/05/11  . ROTATOR CUFF REPAIR     Left  . SHOULDER ARTHROSCOPY WITH SUBACROMIAL DECOMPRESSION, ROTATOR CUFF REPAIR AND BICEP TENDON REPAIR Left 05/25/2015   Procedure: SHOULDER ARTHROSCOPY WITH SUBACROMIAL DECOMPRESSION, ROTATOR CUFF REPAIR AND BICEP TENODESIS, DEBRIDEMENT. ;  Surgeon: Meredith Pel, MD;  Location: [facility];  Service: Orthopedics;  Laterality: Left;  LEFT  SHOULDER ROTATOR CUFF TEAR REPAIR, ARTHROSCOPY, DEBRIDEMENT, BICEPS TENODESIS, SUBACROMIAL DECOMPRESSION.  . TOTAL ABDOMINAL HYSTERECTOMY    . TUBAL LIGATION      There were no vitals filed for this visit.      Subjective Assessment - 04/05/17 1539    Subjective  I put my glove on at night, have to.     Currently in Pain? Yes   Pain Score 6    Pain Location Hand   Pain Orientation Right   Pain Descriptors / Indicators Shooting;Sharp   Pain Type Acute pain   Pain Onset More than a month ago   Pain Frequency Constant                      OT Treatments/Exercises (OP) - 04/05/17 1615      ADLs   ADL Comments Reviewed short and long term goals.  Patient agreeable to goals and very eager for decreased pain.        Sensation Exercises   Desensitization taught patient desensitization techniques with scar massage and use of putty to massage provide deep pressure     Ultrasound   Ultrasound Location Palm of right hand/wrist- over scar   Ultrasound Parameters continuous, 5cm2, 3MHz, 0.8w/cm2, 5 minutes   Ultrasound Goals Edema;Pain     Manual Therapy   Myofascial Release Worked  to release scar - patient tolerating increased pressure this session.  Patient given gel pad and compression wrap to wear at night to help further break down scar .                  OT Education - 04/05/17 1621    Education provided Yes   Education Details massage with putty to decrease sensitivity in palm, rationale for ultrasound, OT goals   Person(s) Educated Patient   Methods Explanation;Demonstration   Comprehension Verbalized understanding          OT Short Term Goals - 04/05/17 1613      OT SHORT TERM GOAL #1   Title Patient will complete home exercise program designed to improve coordination right hand DUE 04/20/17   Status On-going     OT SHORT TERM GOAL #2   Title Patient will demonstrate improved active range of motion in right wrist extension to 45 degrees to aide  with simple cooking tasks   Status On-going     OT SHORT TERM GOAL #3   Title Patient will demonstrate improved grasp strength to 30 lbs to improve her ability to open screw top bottles   Status On-going           OT Long Term Goals - 04/05/17 1605      OT LONG TERM GOAL #1   Title Patient will complete HEP designed to improve hand strength due 05/20/17   Status On-going     OT LONG TERM GOAL #2   Title Patient will demonstrate improved active wrist extension to 60 degrees to aide  with IADL- cooking, housekeeping   Status On-going     OT LONG TERM GOAL #3   Title Patient will legibly write at paragraph level without pain or fatigue   Status On-going     OT LONG TERM GOAL #4   Title Patient will demonstrate improved coordination on 9hole peg test by 3 seconds right hand   Status On-going     OT LONG TERM GOAL #5   Title Patient will report no greater pain at rest (in right wrist / hand) than 3/10   Status On-going               Plan - 04/05/17 1622    Clinical Impression Statement Patient responded well to densisitization and scar mobilization - almmost immediate reduction in pain   Rehab Potential Good   OT Frequency 2x / week   OT Duration 8 weeks   Plan Ultrasound for scar management, tendon glides - set up HEP coordination, or tendon glides   Consulted and Agree with Plan of Care Patient      Patient will benefit from skilled therapeutic intervention in order to improve the following deficits and impairments:  Decreased coordination, Decreased range of motion, Decreased skin integrity, Decreased scar mobility, Decreased strength, Increased edema, Impaired UE functional use, Pain, Impaired sensation  Visit Diagnosis: Stiffness of right hand, not elsewhere classified  Stiffness of right wrist, not elsewhere classified  Pain in joint of right hand  Pain in right wrist  Other lack of coordination  Other disturbances of skin sensation    Problem  List Patient Active Problem List   Diagnosis Date Noted  . Wound infection after surgery 01/30/2017  . Palpitations 08/24/2016  . Noncompliance with CPAP treatment 12/28/2015  . Poor compliance with CPAP treatment 10/26/2014  . CPAP use counseling 07/28/2014  . OSA on CPAP 07/28/2014  . Chronic ethmoidal sinusitis  07/28/2014  . Hypersomnolent 04/02/2014  . Obstructive sleep apnea 04/02/2014  . Non-compliance with treatment 03/05/2014  . Diabetes mellitus 12/05/2011  . Hypertension 12/05/2011  . GERD (gastroesophageal reflux disease)   . 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 Chronic cough 07/26/2011    Mariah Milling, OTR/L 04/05/2017, 4:24 PM  [facility] [address] [place], [place], [address] Phone: [phone]   Fax:  [phone]  Name: Stephanie Barber MRN: [id number] Date of Birth: Jul 02, 1941

## 2017-04-10 IMAGING — MR MR SHOULDER*L* W/ CM
6 series · 40 of 40 positions shown · IV contrast (agent unspecified)
Comparison: Injection images same date.

CLINICAL DATA: Left shoulder pain for 6 months. No acute injury or
prior relevant surgery. Evaluate for rotator cuff or labral tear.
Initial encounter.

EXAM:
MR ARTHROGRAM OF THE LEFT SHOULDER
TECHNIQUE: Multiplanar, multisequence MR imaging of the left shoulder was
performed following the administration of intra-articular contrast.
CONTRAST:  See Injection Documentation.

[Series 5: T1 fat-sat · axial · 4.0mm · 0.23mm/px · z∈[-53,+13]mm · 6 of 18 slices shown (1 of 3)]
[im 1/18]
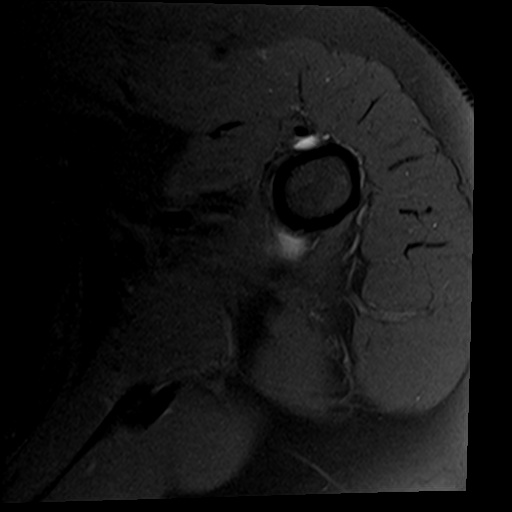
[im 4/18]
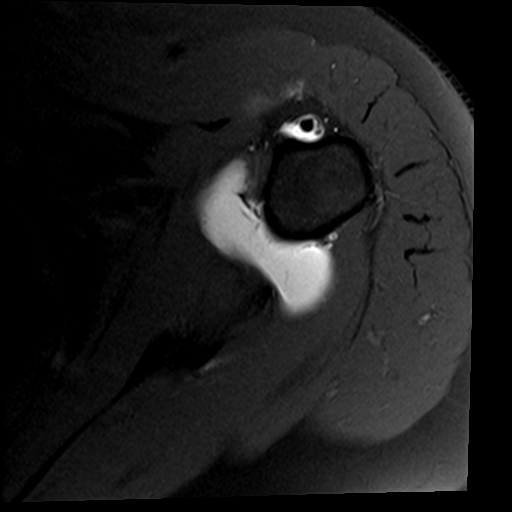
[im 7/18]
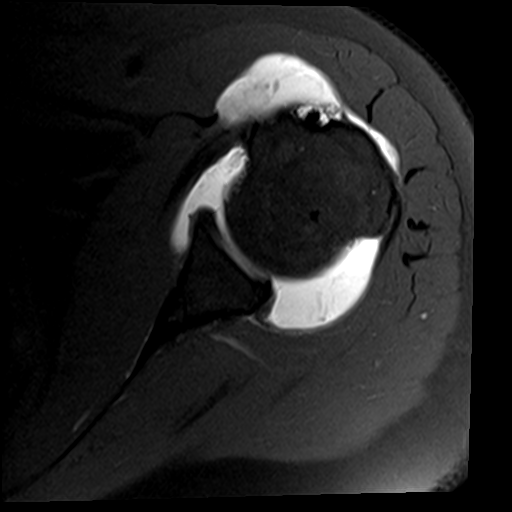
[im 11/18]
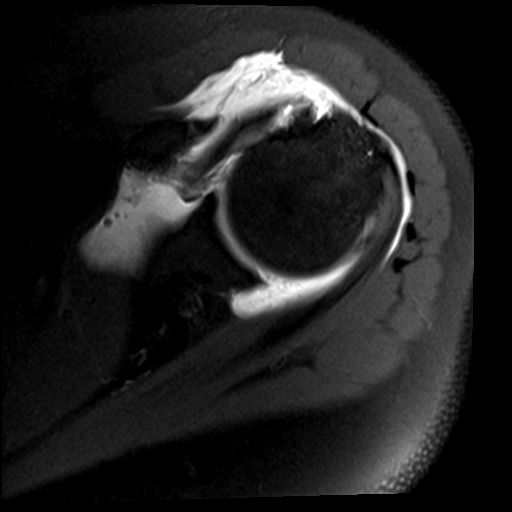
[im 14/18]
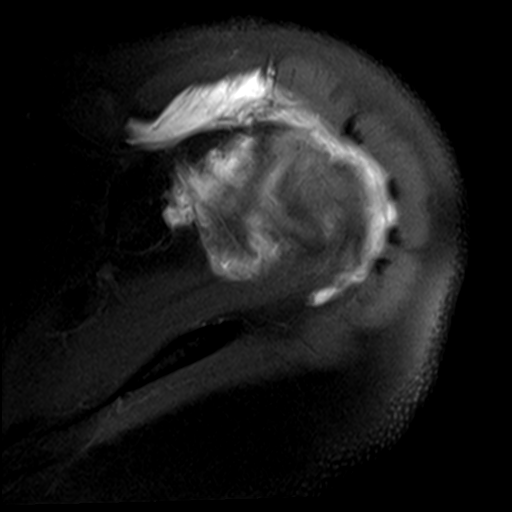
[im 18/18]
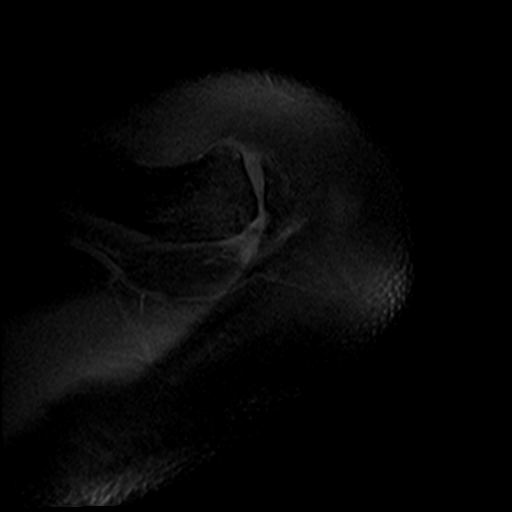

[Series 6: T1 fat-sat · oblique · 4.0mm · 0.62mm/px · 6 of 16 slices shown (2 of 3)]
[im 1/16]
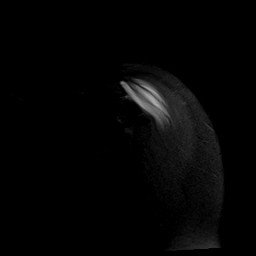
[im 4/16]
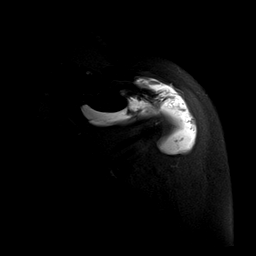
[im 7/16]
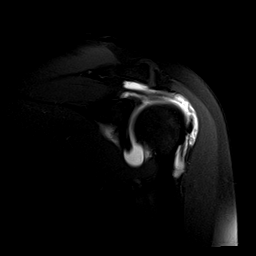
[im 10/16]
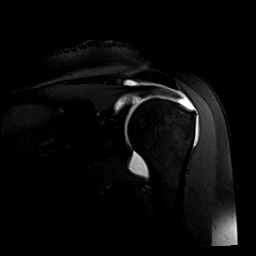
[im 13/16]
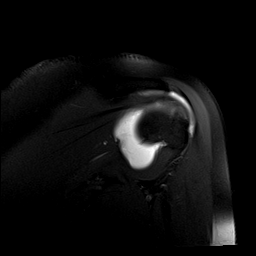
[im 16/16]
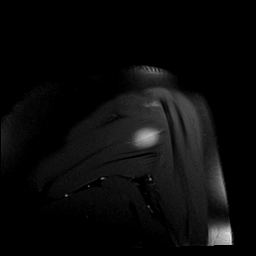

[Series 7: T1 · oblique · 4.0mm · 0.62mm/px · 7 of 16 slices shown]
[im 1/16]
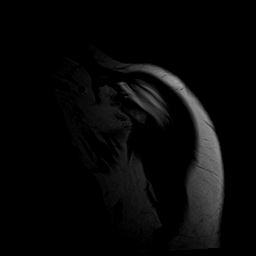
[im 3/16]
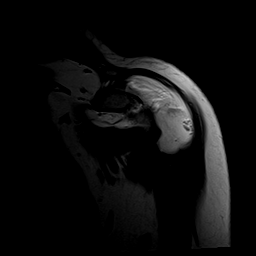
[im 6/16]
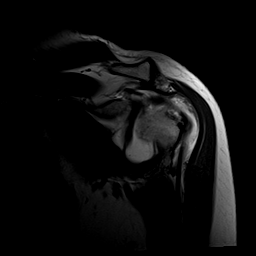
[im 8/16]
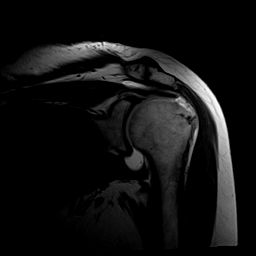
[im 11/16]
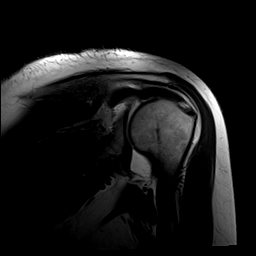
[im 13/16]
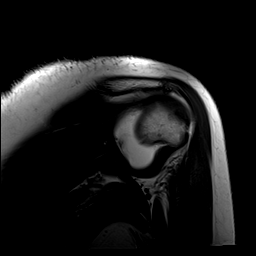
[im 16/16]
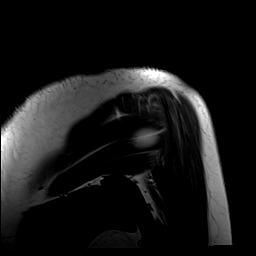

[Series 8: T2 fat-sat · oblique · 4.0mm · 0.59mm/px · 7 of 16 slices shown]
[im 1/16]
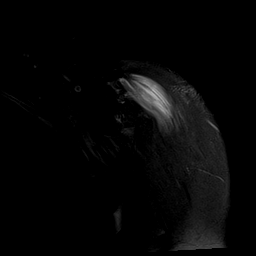
[im 3/16]
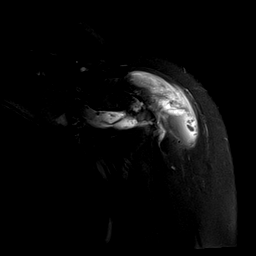
[im 6/16]
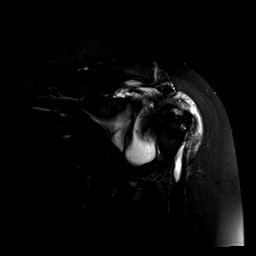
[im 8/16]
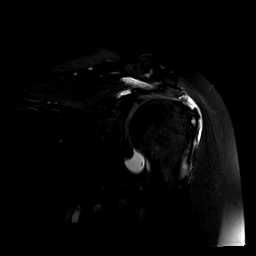
[im 11/16]
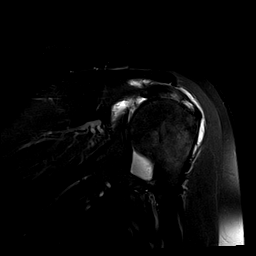
[im 13/16]
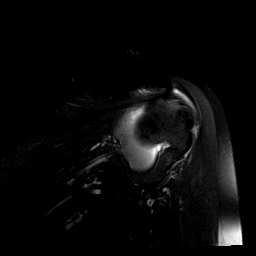
[im 16/16]
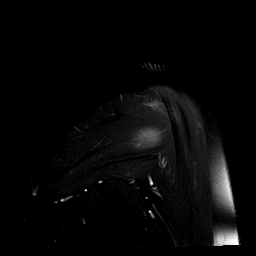

[Series 9: T2 · sagittal · 4.0mm · 0.62mm/px · 7 of 16 slices shown]
[im 1/16]
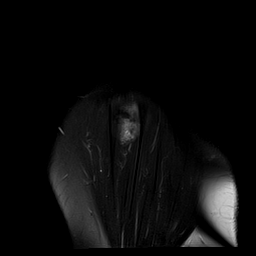
[im 3/16]
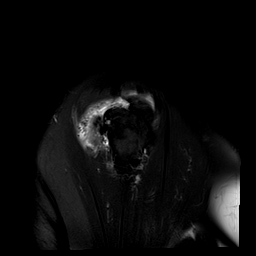
[im 6/16]
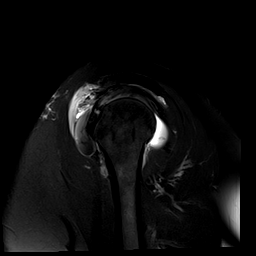
[im 8/16]
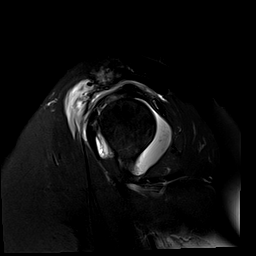
[im 11/16]
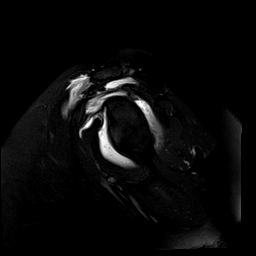
[im 13/16]
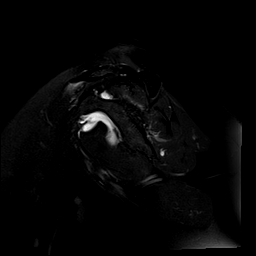
[im 16/16]
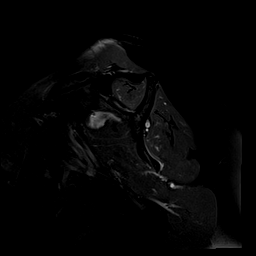

[Series 13: T1 fat-sat · sagittal · 4.0mm · 0.62mm/px · 7 of 16 slices shown (3 of 3)]
[im 1/16]
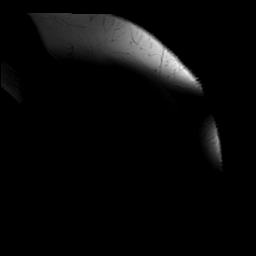
[im 3/16]
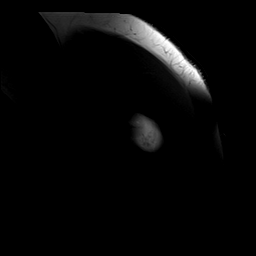
[im 6/16]
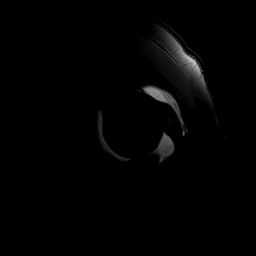
[im 8/16]
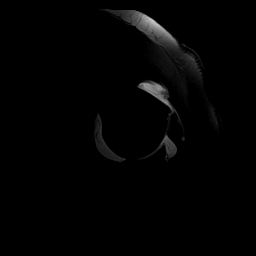
[im 11/16]
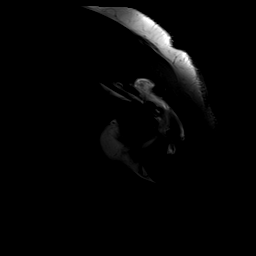
[im 13/16]
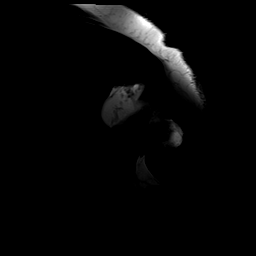
[im 16/16]
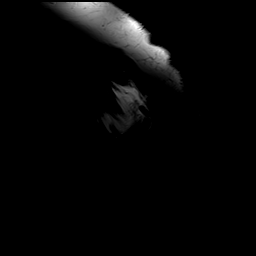

[40 of 40 positions shown; findings below may reference images not displayed]

FINDINGS: Glenohumeral Joint: Adequately distended with contrast.Contrasted
fluid extends into the subacromial -subdeltoid bursa anteriorly via
a full-thickness rotator cuff tear. There is some synovial
irregularity within the subacromial -subdeltoid bursa without
discrete loose body. Minimal glenohumeral degenerative changes are
present for age.

Labrum: Superior labral degeneration without discrete tear or
paralabral cyst.The glenohumeral ligaments appear intact.

Biceps long head: The intra-articular portion of the long head of
the biceps tendon is poorly visualized in the axial and coronal
planes and appears at least partially torn. There may be a few
intact fibers on the sagittal images. The tendon is normally
positioned distally within the bicipital groove.

Acromioclavicular Joint: The acromion is type 2. There are mild
acromioclavicular degenerative changes. As above, there is complex
fluid within the subacromial -subdeltoid bursa which communicates
with the glenohumeral joint.

Rotator cuff: Full-thickness insertional tear of the supraspinatus
tendon appears nearly complete, measuring approximately 19 mm AP and
with approximately 10 mm of tendon retraction. The additional
components of the rotator cuff are intact. There is mild
subscapularis and infraspinous tendinosis.

Muscles:  No focal muscular atrophy or edema.

Bones:  No significant extra-articular osseous findings.
IMPRESSION: 1. Full thickness nearly complete insertional tear of the
supraspinatus tendon with associated mild tendon retraction. No
significant muscular atrophy.
2. Bicipital tendinosis and at least partial tearing of the
intra-articular portion.
3. Mild superior labral degeneration.
4. Synovial irregularity within the subacromial-subdeltoid bursa
suspicious for pre-existing bursitis and synovitis.

## 2017-04-10 IMAGING — RF DG FLUORO GUIDE NDL PLC/BX
3 series · 3 of 3 positions shown · non-contrast
Comparison: none

CLINICAL DATA: Left shoulder pain. Personal history of rotator cuff
tear on the right.

[Series 1: (hospital) · 1 of 1 slices shown (1 of 3)]
[im 1/1]
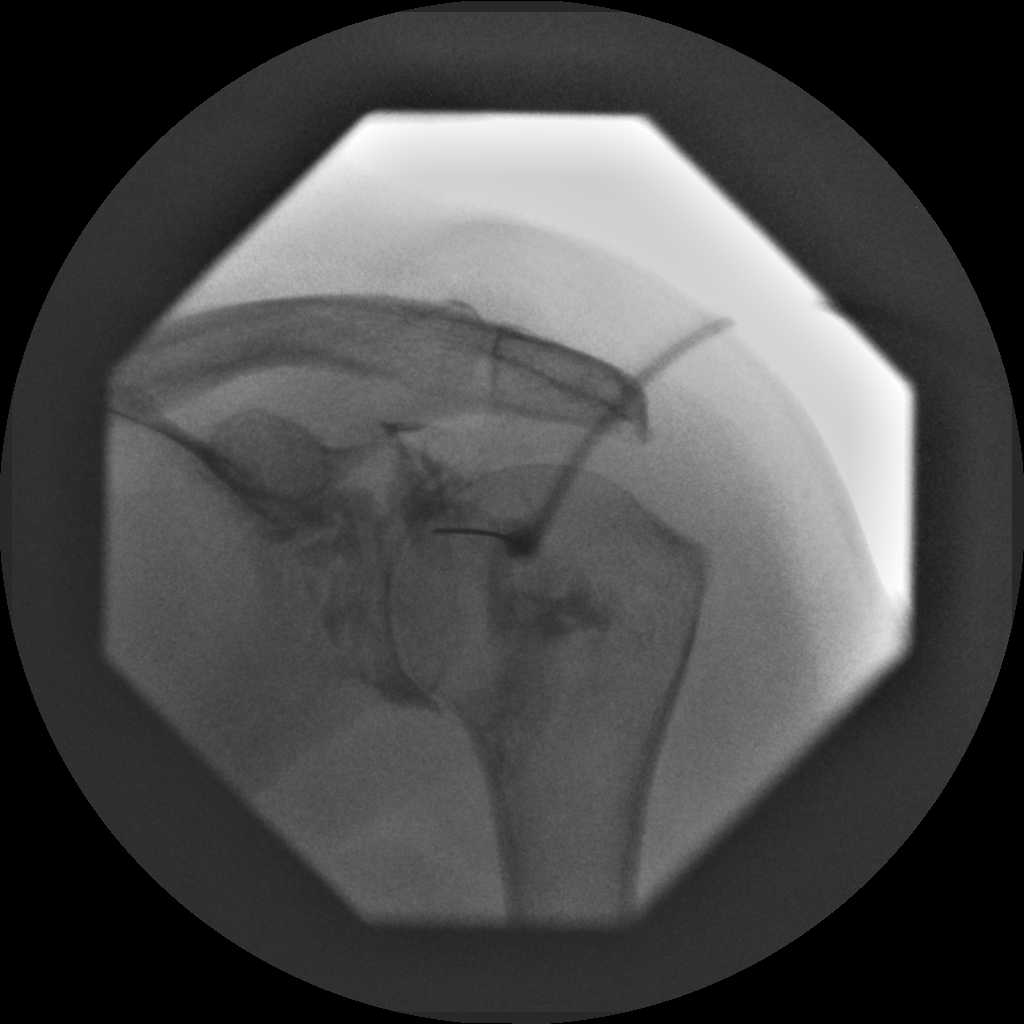

[Series 2: (hospital) · 1 of 1 slices shown (2 of 3)]
[im 1/1]
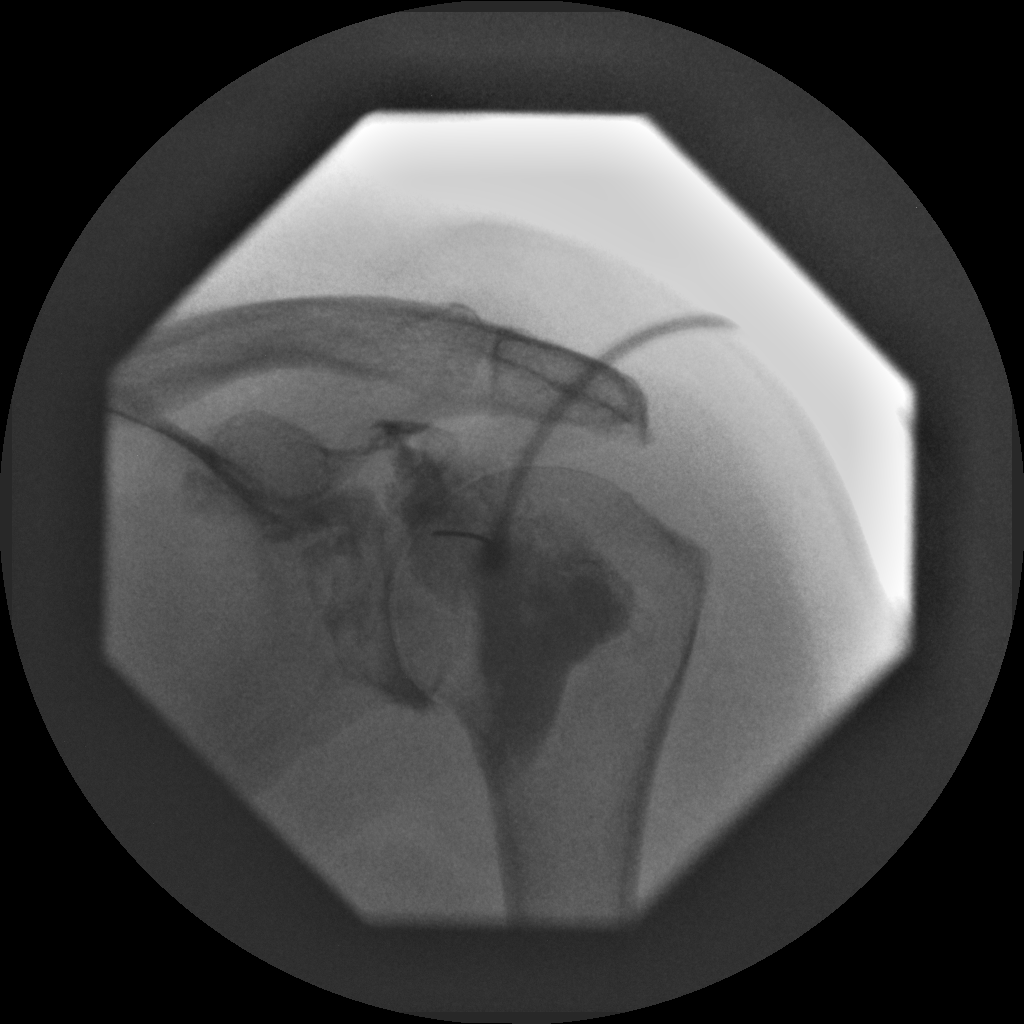

[Series 3: (hospital) · 1 of 1 slices shown (3 of 3)]
[im 1/1]
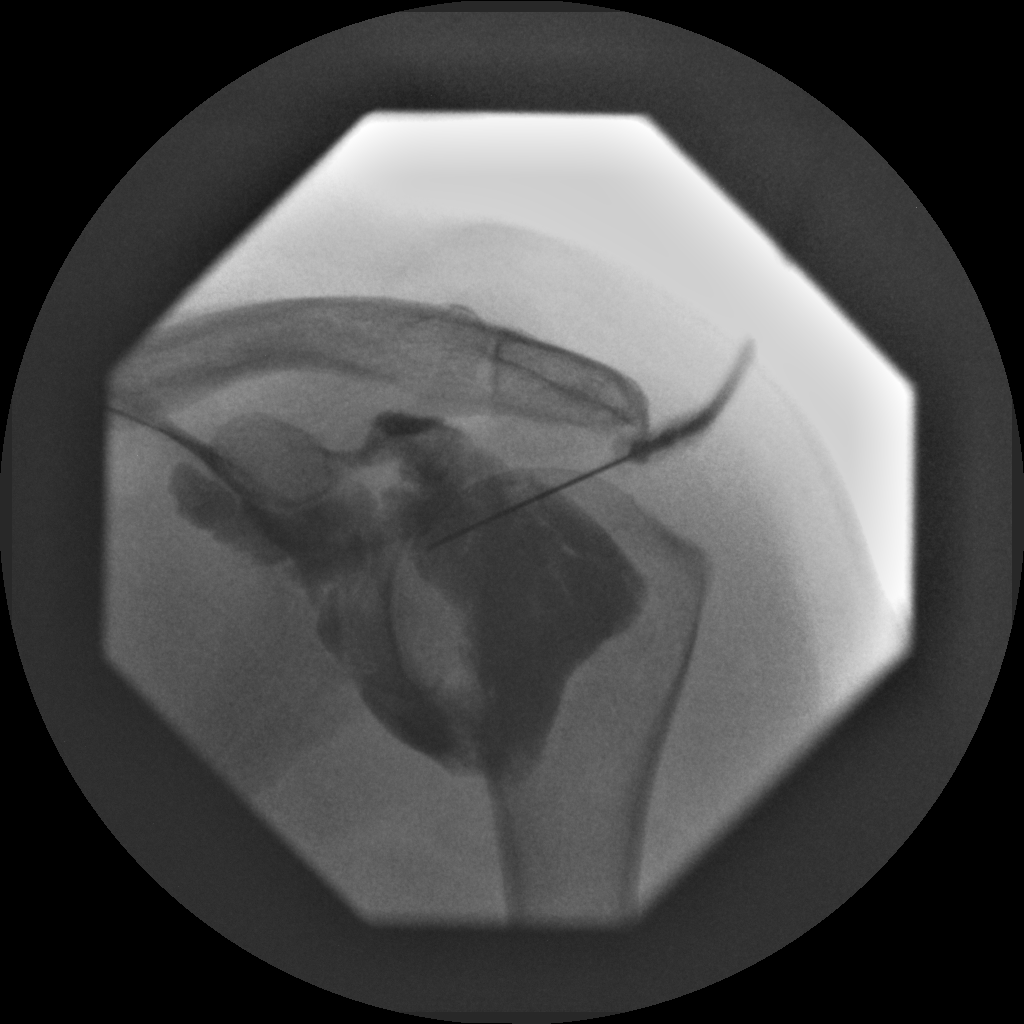

[3 of 3 positions shown; findings below may reference images not displayed]

FLUOROSCOPY TIME:  Fluoroscopy Time:  28 seconds

Number of Acquired Images:  0

PROCEDURE:
Left SHOULDER INJECTION UNDER FLUOROSCOPY

An appropriate skin entrance site was determined. The site was
marked, prepped with Betadine, draped in the usual sterile fashion,
and infiltrated locally with buffered Lidocaine. 22 gauge spinal
needle was advanced to the superomedial margin of the humeral head
under intermittent fluoroscopy. 1 ml of Lidocaine injected easily. A
mixture of 0.1 ml Multihance and 20 ml of dilute Omnipaque 180 was
then used to opacify the left shoulder capsule. No immediate
complication.
IMPRESSION: Technically successful left shoulder injection for MRI.

## 2017-04-11 ENCOUNTER — Encounter: Payer: Self-pay | Admitting: Occupational Therapy

## 2017-04-11 ENCOUNTER — Ambulatory Visit: Payer: Medicare Other | Admitting: Occupational Therapy

## 2017-04-11 DIAGNOSIS — R278 Other lack of coordination: Secondary | ICD-10-CM

## 2017-04-11 DIAGNOSIS — M25631 Stiffness of right wrist, not elsewhere classified: Secondary | ICD-10-CM | POA: Diagnosis not present

## 2017-04-11 DIAGNOSIS — M25541 Pain in joints of right hand: Secondary | ICD-10-CM | POA: Diagnosis not present

## 2017-04-11 DIAGNOSIS — M25531 Pain in right wrist: Secondary | ICD-10-CM

## 2017-04-11 DIAGNOSIS — M25641 Stiffness of right hand, not elsewhere classified: Secondary | ICD-10-CM | POA: Diagnosis not present

## 2017-04-11 DIAGNOSIS — M25512 Pain in left shoulder: Secondary | ICD-10-CM | POA: Diagnosis not present

## 2017-04-11 DIAGNOSIS — M25612 Stiffness of left shoulder, not elsewhere classified: Secondary | ICD-10-CM | POA: Diagnosis not present

## 2017-04-11 DIAGNOSIS — R208 Other disturbances of skin sensation: Secondary | ICD-10-CM

## 2017-04-11 DIAGNOSIS — M6281 Muscle weakness (generalized): Secondary | ICD-10-CM

## 2017-04-11 NOTE — Therapy (Signed)
[facility] [address] [place], [place], [address] Phone: [phone]   Fax:  [phone]  Occupational Therapy Treatment  Patient Details  Name: Stephanie Barber MRN: [id number] Date of Birth: 06/25/1941 Referring Provider: Dr Christella Noa  Encounter Date: 04/11/2017      OT End of Session - 04/11/17 1505    Visit Number 3   Number of Visits 16   Date for OT Re-Evaluation 05/20/17   Authorization Type mcare trad- primary   Authorization Time Period gcode and pn every 10th visit   Authorization - Visit Number 3   Authorization - Number of Visits 10   OT Start Time 1410   OT Stop Time 1459   OT Time Calculation (min) 49 min   Activity Tolerance Patient tolerated treatment well   Behavior During Therapy [facility] for tasks assessed/performed      Past Medical History:  Diagnosis Date  . Arthritis   . Chronic headache   . Complication of anesthesia    allergy to Novocaine   . Diabetes mellitus   . GERD (gastroesophageal reflux disease)   . Heart murmur   . High cholesterol   . Hyperlipidemia   . Hypertension   . Intracranial atherosclerosis    per MRA  . Migraine   . Narcolepsy and cataplexy   . OSA on CPAP    residual AHI of-1.1, CPAPevery night   . Rectal prolapse     Past Surgical History:  Procedure Laterality Date  . ABDOMINAL HYSTERECTOMY    . BACK SURGERY     L4,L5 discectomy  . CARDIAC CATHETERIZATION    . CARPAL TUNNEL RELEASE Right 01/18/2017  . KIDNEY SURGERY  1998   Right.  growth removed  . rectal prolapse repair  12/05/11  . ROTATOR CUFF REPAIR     Left  . SHOULDER ARTHROSCOPY WITH SUBACROMIAL DECOMPRESSION, ROTATOR CUFF REPAIR AND BICEP TENDON REPAIR Left 05/25/2015   Procedure: SHOULDER ARTHROSCOPY WITH SUBACROMIAL DECOMPRESSION, ROTATOR CUFF REPAIR AND BICEP TENODESIS, DEBRIDEMENT. ;  Surgeon: Meredith Pel, MD;  Location: [facility];  Service: Orthopedics;  Laterality: Left;  LEFT  SHOULDER ROTATOR CUFF TEAR REPAIR, ARTHROSCOPY, DEBRIDEMENT, BICEPS TENODESIS, SUBACROMIAL DECOMPRESSION.  . TOTAL ABDOMINAL HYSTERECTOMY    . TUBAL LIGATION      There were no vitals filed for this visit.      Subjective Assessment - 04/11/17 1408    Subjective  I love that pad- it makes it feel so good.     Currently in Pain? Yes   Pain Score 3    Pain Location Hand   Pain Orientation Right   Pain Descriptors / Indicators Aching   Pain Type Chronic pain   Pain Onset More than a month ago   Pain Frequency Constant   Aggravating Factors  bumping hand - palmar surface   Pain Relieving Factors protection   Effect of Pain on Daily Activities limits use of right hand            [facility] OT Assessment - 04/11/17 0001      AROM   Right Wrist Extension 40 Degrees     Hand Function   Right Hand Gross Grasp Impaired   Right Hand Grip (lbs) 40                  OT Treatments/Exercises (OP) - 04/11/17 0001      Wrist Exercises   Wrist Flexion AROM;Strengthening;Right;10 reps;Bar weights/barbell   Bar Weights/Barbell (Wrist Flexion)  2 lbs   Wrist Extension AROM;Strengthening;Right;10 reps;Bar weights/barbell   Bar Weights/Barbell (Wrist Extension) 2 lbs   Wrist Radial Deviation AROM;Strengthening;Right;10 reps;Bar weights/barbell   Bar Weights/Barbell (Radial Deviation) 2 lbs   Wrist Ulnar Deviation AROM;Strengthening;Right;10 reps;Bar weights/barbell   Bar Weights/Barbell (Ulnar Deviation) 2 lbs     Fine Motor Coordination   In Hand Manipulation Training Patient able to manipulate small pegs in hand withot difficulty. Patient still reporting difficulty with texting (coordination)      Hand Exercises   Other Hand Exercises Reviewed hook and composite fist, able to tolerate with wrist flexion.  Needs additional work for composite extension with wrist extension     Ultrasound   Ultrasound Location Palm of right hand, wrist base- volar surface   Ultrasound Parameters  continuous, 5cm2, 89mhz,, 12 min                OT Education - 04/11/17 1505    Education provided Yes   Education Details putty exercises, reviewed current grip and pinch strength   Person(s) Educated Patient   Methods Explanation;Demonstration;Verbal cues;Handout   Comprehension Verbalized understanding;Returned demonstration          OT Short Term Goals - 04/11/17 1511      OT SHORT TERM GOAL #1   Title Patient will complete home exercise program designed to improve coordination right hand DUE 04/20/17   Status On-going     OT SHORT TERM GOAL #2   Title Patient will demonstrate improved active range of motion in right wrist extension to 45 degrees to aide with simple cooking tasks   Status On-going  40 degrees     OT SHORT TERM GOAL #3   Title Patient will demonstrate improved grasp strength to 30 lbs to improve her ability to open screw top bottles   Status Achieved           OT Long Term Goals - 04/11/17 1511      OT LONG TERM GOAL #1   Title Patient will complete HEP designed to improve hand strength due 05/20/17   Status On-going     OT LONG TERM GOAL #2   Title Patient will demonstrate improved active wrist extension to 60 degrees to aide  with IADL- cooking, housekeeping     OT LONG TERM GOAL #3   Title Patient will legibly write at paragraph level without pain or fatigue   Status On-going     OT LONG TERM GOAL #4   Title Patient will demonstrate improved coordination on 9hole peg test by 3 seconds right hand   Status On-going     OT LONG TERM GOAL #5   Title Patient will report no greater pain at rest (in right wrist / hand) than 3/10   Status On-going               Plan - 04/11/17 1506    Clinical Impression Statement Patient with significant decrease in pain in right hand, improving range of motion and strength.     Occupational Profile and client history currently impacting functional performance see evaluation   Occupational  performance deficits (Please refer to evaluation for details): ADL's;IADL's;Rest and Sleep;Work;Leisure;Social Participation   Rehab Potential Good   OT Frequency 2x / week   OT Duration 8 weeks   OT Treatment/Interventions Self-care/ADL training;Cryotherapy;Electrical Stimulation;Moist Heat;Fluidtherapy;Parrafin;Ultrasound;Therapeutic exercise;Neuromuscular education;DME and/or AE instruction;Passive range of motion;Scar mobilization;Manual Therapy;Splinting;Therapeutic activities;Patient/family education   Plan review short term goals, add wrist range of motion and strengthening exercises  Clinical Decision Making Limited treatment options, no task modification necessary   OT Home Exercise Plan intiiated wrist extension stretch, added therapy putty exercises   Consulted and Agree with Plan of Care Patient      Patient will benefit from skilled therapeutic intervention in order to improve the following deficits and impairments:  Decreased coordination, Decreased range of motion, Decreased skin integrity, Decreased scar mobility, Decreased strength, Increased edema, Impaired UE functional use, Pain, Impaired sensation  Visit Diagnosis: Stiffness of right hand, not elsewhere classified  Stiffness of right wrist, not elsewhere classified  Pain in joint of right hand  Pain in right wrist  Other lack of coordination  Other disturbances of skin sensation  Muscle weakness (generalized)    Problem List Patient Active Problem List   Diagnosis Date Noted  . Wound infection after surgery 01/30/2017  . Palpitations 08/24/2016  . Noncompliance with CPAP treatment 12/28/2015  . Poor compliance with CPAP treatment 10/26/2014  . CPAP use counseling 07/28/2014  . OSA on CPAP 07/28/2014  . 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 Hypersomnolent 04/02/2014  . Obstructive sleep apnea 04/02/2014  . Non-compliance with treatment 03/05/2014  . Diabetes mellitus 12/05/2011  . Hypertension  12/05/2011  . GERD (gastroesophageal reflux disease)   . 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 Chronic cough 07/26/2011    Mariah Milling, OTR/L 04/11/2017, 3:12 PM  [facility] [address] [place], [place], [address] Phone: [phone]   Fax:  [phone]  Name: Stephanie Barber MRN: [id number] Date of Birth: September 24, 1941

## 2017-04-11 NOTE — Patient Instructions (Signed)
1. Grip Strengthening (Resistive Putty)   Squeeze putty using thumb and all fingers. Repeat 15 times. Do 1 sessions per day.   Extension (Assistive Putty)   Roll putty back and forth, being sure to use all fingertips. Repeat 3 times. Do 1 sessions per day.  Then pinch as below.   Palmar Pinch Strengthening (Resistive Putty)   Pinch putty between thumb and each fingertip in turn after rolling out           IP Fisting (Resistive Putty)   Keeping knuckles straight, bend fingertips to squeeze putty. Repeat 10 times. Do 1 sessions per day.  MP Flexion (Resistive Putty)   Bending only at large knuckles, press putty down against thumb. Keep fingertips straight. Repeat 10 times. Do 2 sessions per day. 

## 2017-04-13 ENCOUNTER — Encounter: Payer: Self-pay | Admitting: Occupational Therapy

## 2017-04-13 ENCOUNTER — Ambulatory Visit: Payer: Medicare Other | Attending: Family Medicine | Admitting: Occupational Therapy

## 2017-04-13 DIAGNOSIS — R208 Other disturbances of skin sensation: Secondary | ICD-10-CM

## 2017-04-13 DIAGNOSIS — M25531 Pain in right wrist: Secondary | ICD-10-CM | POA: Diagnosis not present

## 2017-04-13 DIAGNOSIS — M25631 Stiffness of right wrist, not elsewhere classified: Secondary | ICD-10-CM | POA: Diagnosis not present

## 2017-04-13 DIAGNOSIS — R278 Other lack of coordination: Secondary | ICD-10-CM | POA: Diagnosis not present

## 2017-04-13 DIAGNOSIS — M6281 Muscle weakness (generalized): Secondary | ICD-10-CM | POA: Insufficient documentation

## 2017-04-13 DIAGNOSIS — M25541 Pain in joints of right hand: Secondary | ICD-10-CM | POA: Insufficient documentation

## 2017-04-13 DIAGNOSIS — M25641 Stiffness of right hand, not elsewhere classified: Secondary | ICD-10-CM

## 2017-04-13 NOTE — Patient Instructions (Signed)
AROM: Wrist Extension    With right palm down, bend wrist up. Repeat __10__ times per set. Do __3__ sets per session. Do _2___ sessions per day.  In same position bend wrist up with fingers straight (hand open) Repeat 10 times, 3 sets, twice daily Copyright  [employer]. All rights reserved.

## 2017-04-13 NOTE — Therapy (Signed)
[facility] [address] [place], [place], [address] Phone: [phone]   Fax:  [phone]  Occupational Therapy Treatment  Patient Details  Name: Stephanie Barber MRN: [id number] Date of Birth: [birth year] Referring Provider: Dr Christella Noa  Encounter Date: 04/13/2017      OT End of Session - 04/13/17 1232    Visit Number 4   Number of Visits 16   Date for OT Re-Evaluation 05/20/17   Authorization Type mcare trad- primary   Authorization Time Period gcode and pn every 10th visit   Authorization - Visit Number 4   Authorization - Number of Visits 10   OT Start Time 1017   OT Stop Time 1100   OT Time Calculation (min) 43 min   Activity Tolerance Patient tolerated treatment well   Behavior During Therapy [facility] for tasks assessed/performed      Past Medical History:  Diagnosis Date  . Arthritis   . Chronic headache   . Complication of anesthesia    allergy to Novocaine   . Diabetes mellitus   . GERD (gastroesophageal reflux disease)   . Heart murmur   . High cholesterol   . Hyperlipidemia   . Hypertension   . Intracranial atherosclerosis    per MRA  . Migraine   . Narcolepsy and cataplexy   . OSA on CPAP    residual AHI of-1.1, CPAPevery night   . Rectal prolapse     Past Surgical History:  Procedure Laterality Date  . ABDOMINAL HYSTERECTOMY    . BACK SURGERY     L4,L5 discectomy  . CARDIAC CATHETERIZATION    . CARPAL TUNNEL RELEASE Right 01/18/2017  . KIDNEY SURGERY  1998   Right.  growth removed  . rectal prolapse repair  12/05/11  . ROTATOR CUFF REPAIR     Left  . SHOULDER ARTHROSCOPY WITH SUBACROMIAL DECOMPRESSION, ROTATOR CUFF REPAIR AND BICEP TENDON REPAIR Left 05/25/2015   Procedure: SHOULDER ARTHROSCOPY WITH SUBACROMIAL DECOMPRESSION, ROTATOR CUFF REPAIR AND BICEP TENODESIS, DEBRIDEMENT. ;  Surgeon: Meredith Pel, MD;  Location: [facility];  Service: Orthopedics;  Laterality: Left;  LEFT  SHOULDER ROTATOR CUFF TEAR REPAIR, ARTHROSCOPY, DEBRIDEMENT, BICEPS TENODESIS, SUBACROMIAL DECOMPRESSION.  . TOTAL ABDOMINAL HYSTERECTOMY    . TUBAL LIGATION      There were no vitals filed for this visit.      Subjective Assessment - 04/13/17 1019    Subjective  I think I wrapped that bandage too tight I think, my hand gave me a fit.  It's better now.    Currently in Pain? Yes   Pain Score 2    Pain Location Hand   Pain Orientation Right   Pain Descriptors / Indicators Aching   Pain Type Chronic pain   Pain Onset More than a month ago   Pain Frequency Constant   Aggravating Factors  bumping hand - palmae surface   Pain Relieving Factors protection   Effect of Pain on Daily Activities guards hand   Multiple Pain Sites No                      OT Treatments/Exercises (OP) - 04/13/17 0001      Weighted Stretch Over Towel Roll   Wrist Extension Weighted Stretch Limitations Wrist extension with closed fist, and open digits     Ultrasound   Ultrasound Location Palm of right hand   Ultrasound Parameters cont, 5cm2, 20mhz, x10 min   Ultrasound Goals Edema;Pain  Manual Therapy   Manual Therapy Joint mobilization   Joint Mobilization Traction and mobilization to carpals with prolonged wrist extension stretch                OT Education - 04/13/17 1232    Education provided Yes   Education Details wrist extension exercises   Person(s) Educated Patient   Methods Explanation;Demonstration;Handout;Tactile cues;Verbal cues   Comprehension Verbalized understanding;Returned demonstration          OT Short Term Goals - 04/11/17 1511      OT SHORT TERM GOAL #1   Title Patient will complete home exercise program designed to improve coordination right hand DUE 04/20/17   Status On-going     OT SHORT TERM GOAL #2   Title Patient will demonstrate improved active range of motion in right wrist extension to 45 degrees to aide with simple cooking tasks    Status On-going  40 degrees     OT SHORT TERM GOAL #3   Title Patient will demonstrate improved grasp strength to 30 lbs to improve her ability to open screw top bottles   Status Achieved           OT Long Term Goals - 04/11/17 1511      OT LONG TERM GOAL #1   Title Patient will complete HEP designed to improve hand strength due 05/20/17   Status On-going     OT LONG TERM GOAL #2   Title Patient will demonstrate improved active wrist extension to 60 degrees to aide  with IADL- cooking, housekeeping     OT LONG TERM GOAL #3   Title Patient will legibly write at paragraph level without pain or fatigue   Status On-going     OT LONG TERM GOAL #4   Title Patient will demonstrate improved coordination on 9hole peg test by 3 seconds right hand   Status On-going     OT LONG TERM GOAL #5   Title Patient will report no greater pain at rest (in right wrist / hand) than 3/10   Status On-going               Plan - 04/13/17 1233    Clinical Impression Statement Patient continues to report decrease pain, and demonstrate improved strength and range of motion in right wrist and hand.     Occupational performance deficits (Please refer to evaluation for details): ADL's;IADL's;Rest and Sleep;Work;Leisure;Social Participation   Rehab Potential Good   OT Frequency 2x / week   OT Duration 8 weeks   OT Treatment/Interventions Self-care/ADL training;Cryotherapy;Electrical Stimulation;Moist Heat;Fluidtherapy;Parrafin;Ultrasound;Therapeutic exercise;Neuromuscular education;DME and/or AE instruction;Passive range of motion;Scar mobilization;Manual Therapy;Splinting;Therapeutic activities;Patient/family education   Plan review short term goals, wrist and hand strengthening   Clinical Decision Making Limited treatment options, no task modification necessary   OT Home Exercise Plan added wrist extension    Consulted and Agree with Plan of Care Patient      Patient will benefit from skilled  therapeutic intervention in order to improve the following deficits and impairments:  Decreased coordination, Decreased range of motion, Decreased skin integrity, Decreased scar mobility, Decreased strength, Increased edema, Impaired UE functional use, Pain, Impaired sensation  Visit Diagnosis: Stiffness of right hand, not elsewhere classified  Stiffness of right wrist, not elsewhere classified  Pain in joint of right hand  Pain in right wrist  Other lack of coordination  Other disturbances of skin sensation  Muscle weakness (generalized)    Problem List Patient Active Problem List   Diagnosis  Date Noted  . Wound infection after surgery 01/30/2017  . Palpitations 08/24/2016  . Noncompliance with CPAP treatment 12/28/2015  . Poor compliance with CPAP treatment 10/26/2014  . CPAP use counseling 07/28/2014  . OSA on CPAP 07/28/2014  . 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 Hypersomnolent 04/02/2014  . Obstructive sleep apnea 04/02/2014  . Non-compliance with treatment 03/05/2014  . Diabetes mellitus 12/05/2011  . Hypertension 12/05/2011  . GERD (gastroesophageal reflux disease)   . 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 Chronic cough 07/26/2011    Mariah Milling, OTR/L 04/13/2017, 12:35 PM  [facility] [address] [place], [place], [address] Phone: [phone]   Fax:  [phone]  Name: Stephanie Barber MRN: [id number] Date of Birth: 1941-07-27

## 2017-04-17 ENCOUNTER — Ambulatory Visit: Payer: Medicare Other | Admitting: Occupational Therapy

## 2017-04-17 ENCOUNTER — Encounter: Payer: Self-pay | Admitting: Occupational Therapy

## 2017-04-17 DIAGNOSIS — M25641 Stiffness of right hand, not elsewhere classified: Secondary | ICD-10-CM | POA: Diagnosis not present

## 2017-04-17 DIAGNOSIS — M25541 Pain in joints of right hand: Secondary | ICD-10-CM

## 2017-04-17 DIAGNOSIS — M25531 Pain in right wrist: Secondary | ICD-10-CM

## 2017-04-17 DIAGNOSIS — R208 Other disturbances of skin sensation: Secondary | ICD-10-CM

## 2017-04-17 DIAGNOSIS — M25631 Stiffness of right wrist, not elsewhere classified: Secondary | ICD-10-CM

## 2017-04-17 DIAGNOSIS — M6281 Muscle weakness (generalized): Secondary | ICD-10-CM

## 2017-04-17 DIAGNOSIS — R278 Other lack of coordination: Secondary | ICD-10-CM | POA: Diagnosis not present

## 2017-04-17 NOTE — Patient Instructions (Signed)
  Coordination Activities  Perform the following activities for 10 minutes one time per day with right hand(s).   Rotate ball in fingertips (clockwise and counter-clockwise).  Flip cards 1 at a time as fast as you can.  Deal cards with your thumb (Hold deck in hand and push card off top with thumb).  Rotate card in hand (clockwise and counter-clockwise).  Shuffle cards.  Pick up coins and stack.  Pick up coins one at a time until you get 5-10 in your hand, then move coins from palm to fingertips to stack one at a time.  Twirl pen between fingers.  Practice writing and/or typing.

## 2017-04-17 NOTE — Therapy (Signed)
[facility] [address] [place], [place], [address] Phone: [phone]   Fax:  [phone]  Occupational Therapy Treatment  Patient Details  Name: Stephanie Barber MRN: [id number] Date of Birth: 04-Jun-1941 Referring Provider: Dr Christella Noa  Encounter Date: 04/17/2017      OT End of Session - 04/17/17 1645    Visit Number 5   Number of Visits 16   Date for OT Re-Evaluation 05/20/17   Authorization Type mcare trad- primary   Authorization Time Period gcode and pn every 10th visit   Authorization - Visit Number 5   Authorization - Number of Visits 10   OT Start Time 9518   OT Stop Time 1615   OT Time Calculation (min) 45 min   Activity Tolerance Patient tolerated treatment well   Behavior During Therapy [facility] for tasks assessed/performed      Past Medical History:  Diagnosis Date  . Arthritis   . Chronic headache   . Complication of anesthesia    allergy to Novocaine   . Diabetes mellitus   . GERD (gastroesophageal reflux disease)   . Heart murmur   . High cholesterol   . Hyperlipidemia   . Hypertension   . Intracranial atherosclerosis    per MRA  . Migraine   . Narcolepsy and cataplexy   . OSA on CPAP    residual AHI of-1.1, CPAPevery night   . Rectal prolapse     Past Surgical History:  Procedure Laterality Date  . ABDOMINAL HYSTERECTOMY    . BACK SURGERY     L4,L5 discectomy  . CARDIAC CATHETERIZATION    . CARPAL TUNNEL RELEASE Right 01/18/2017  . KIDNEY SURGERY  1998   Right.  growth removed  . rectal prolapse repair  12/05/11  . ROTATOR CUFF REPAIR     Left  . SHOULDER ARTHROSCOPY WITH SUBACROMIAL DECOMPRESSION, ROTATOR CUFF REPAIR AND BICEP TENDON REPAIR Left 05/25/2015   Procedure: SHOULDER ARTHROSCOPY WITH SUBACROMIAL DECOMPRESSION, ROTATOR CUFF REPAIR AND BICEP TENODESIS, DEBRIDEMENT. ;  Surgeon: Meredith Pel, MD;  Location: [place];  Service: Orthopedics;  Laterality: Left;  LEFT  SHOULDER ROTATOR CUFF TEAR REPAIR, ARTHROSCOPY, DEBRIDEMENT, BICEPS TENODESIS, SUBACROMIAL DECOMPRESSION.  . TOTAL ABDOMINAL HYSTERECTOMY    . TUBAL LIGATION      There were no vitals filed for this visit.      Subjective Assessment - 04/17/17 1536    Subjective  The numbness is back in my fingers   Currently in Pain? Yes   Pain Score 3    Pain Location Hand   Pain Orientation Right   Pain Descriptors / Indicators Aching   Pain Type Chronic pain   Pain Onset More than a month ago   Pain Frequency Constant   Aggravating Factors  Bumping volar surface of wrist at scar   Pain Relieving Factors protection   Effect of Pain on Daily Activities guarding her hand   Multiple Pain Sites No            [facility] OT Assessment - 04/17/17 0001      AROM   Right Wrist Extension 50 Degrees   Right Wrist Flexion 60 Degrees   Right Wrist Radial Deviation 20 Degrees   Right Wrist Ulnar Deviation 50 Degrees                  OT Treatments/Exercises (OP) - 04/17/17 0001      ADLs   Writing Patient now able to write legibly at  paragraph level, however as she reaches the right hand side of page she notes a block at ulnar and radial aspect of wrist.  Worked on less whole arm motion, and more distal forearm, wrist and hand motion for writing.     ADL Comments Reviewed progress toward goals.       Fine Motor Coordination   Other Fine Motor Exercises Set up home exercise program to address fine motor coordiantion, especially in hand manipulation with right hand.  Patient with limited functional use of ulnar aspect of hand- needed mod cueing to maneuver objectrs from ulnar to radial side of hand.       Manual Therapy   Joint Mobilization Traction and mobilizatton of carpal bones to increase wrist extension.                  OT Education - 04/17/17 6203    Education provided Yes   Education Details coordiantion exercises for RUE   Person(s) Educated Patient   Methods  Explanation;Demonstration;Handout;Tactile cues;Verbal cues   Comprehension Verbalized understanding;Returned demonstration          OT Short Term Goals - 04/17/17 1538      OT SHORT TERM GOAL #1   Title Patient will complete home exercise program designed to improve coordination right hand DUE 04/20/17   Status On-going     OT SHORT TERM GOAL #2   Title Patient will demonstrate improved active range of motion in right wrist extension to 45 degrees to aide with simple cooking tasks   Status Achieved     OT SHORT TERM GOAL #3   Title Patient will demonstrate improved grasp strength to 30 lbs to improve her ability to open screw top bottles   Status Achieved           OT Long Term Goals - 04/17/17 [address] #1   Title Patient will complete HEP designed to improve hand strength due 05/20/17   Status Achieved     OT LONG TERM GOAL #2   Title Patient will demonstrate improved active wrist extension to 60 degrees to aide  with IADL- cooking, housekeeping   Status On-going     OT LONG TERM GOAL #3   Title Patient will legibly write at paragraph level without pain or fatigue   Status Achieved     OT LONG TERM GOAL #4   Title Patient will demonstrate improved coordination on 9hole peg test by 3 seconds right hand   Time 8   Period Weeks   Status Achieved     OT LONG TERM GOAL #5   Title Patient will report no greater pain at rest (in right wrist / hand) than 3/10   Status On-going               Plan - 04/17/17 1645    Clinical Impression Statement Patient continues to show excellent progress, and is reporting decreased pain, and increased functional use of her dominant right hand.     Rehab Potential Good   OT Frequency 2x / week   OT Duration 8 weeks   OT Treatment/Interventions Self-care/ADL training;Cryotherapy;Electrical Stimulation;Moist Heat;Fluidtherapy;Parrafin;Ultrasound;Therapeutic exercise;Neuromuscular education;DME and/or AE  instruction;Passive range of motion;Scar mobilization;Manual Therapy;Splinting;Therapeutic activities;Patient/family education   Plan wrist extension - mobilization followed with active movement.  Wrist / hand strengthening   Clinical Decision Making Limited treatment options, no task modification necessary   OT Home Exercise Plan coordiantion right UE   Consulted and  Agree with Plan of Care Patient      Patient will benefit from skilled therapeutic intervention in order to improve the following deficits and impairments:  Decreased coordination, Decreased range of motion, Decreased skin integrity, Decreased scar mobility, Decreased strength, Increased edema, Impaired UE functional use, Pain, Impaired sensation  Visit Diagnosis: Stiffness of right hand, not elsewhere classified  Stiffness of right wrist, not elsewhere classified  Pain in joint of right hand  Pain in right wrist  Other lack of coordination  Other disturbances of skin sensation  Muscle weakness (generalized)    Problem List Patient Active Problem List   Diagnosis Date Noted  . Wound infection after surgery 01/30/2017  . Palpitations 08/24/2016  . Noncompliance with CPAP treatment 12/28/2015  . Poor compliance with CPAP treatment 10/26/2014  . CPAP use counseling 07/28/2014  . OSA on CPAP 07/28/2014  . 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 Hypersomnolent 04/02/2014  . Obstructive sleep apnea 04/02/2014  . Non-compliance with treatment 03/05/2014  . Diabetes mellitus 12/05/2011  . Hypertension 12/05/2011  . GERD (gastroesophageal reflux disease)   . 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 Chronic cough 07/26/2011    Mariah Milling, OTR/L 04/17/2017, 4:47 PM  [facility] [address] [place], [place], [address] Phone: [phone]   Fax:  [phone]  Name: Stephanie Barber MRN: [id number] Date of Birth: 05/04/1941

## 2017-04-19 ENCOUNTER — Encounter: Payer: Self-pay | Admitting: Occupational Therapy

## 2017-04-19 ENCOUNTER — Ambulatory Visit: Payer: Medicare Other | Admitting: Occupational Therapy

## 2017-04-19 DIAGNOSIS — M25541 Pain in joints of right hand: Secondary | ICD-10-CM

## 2017-04-19 DIAGNOSIS — M25531 Pain in right wrist: Secondary | ICD-10-CM | POA: Diagnosis not present

## 2017-04-19 DIAGNOSIS — R208 Other disturbances of skin sensation: Secondary | ICD-10-CM | POA: Diagnosis not present

## 2017-04-19 DIAGNOSIS — R278 Other lack of coordination: Secondary | ICD-10-CM

## 2017-04-19 DIAGNOSIS — M25641 Stiffness of right hand, not elsewhere classified: Secondary | ICD-10-CM

## 2017-04-19 DIAGNOSIS — M25631 Stiffness of right wrist, not elsewhere classified: Secondary | ICD-10-CM

## 2017-04-19 DIAGNOSIS — M6281 Muscle weakness (generalized): Secondary | ICD-10-CM

## 2017-04-19 NOTE — Therapy (Signed)
[facility] [address] [place], [place], [address] Phone: [phone]   Fax:  [phone]  Occupational Therapy Treatment  Patient Details  Name: Stephanie Barber MRN: [id number] Date of Birth: [birth year] Referring Provider: Dr Christella Noa  Encounter Date: 04/19/2017      OT End of Session - 04/19/17 1415    Visit Number 6   Number of Visits 16   Date for OT Re-Evaluation 05/20/17   Authorization Type mcare trad- primary   Authorization Time Period gcode and pn every 10th visit   Authorization - Visit Number 6   Authorization - Number of Visits 10   OT Start Time 1317   OT Stop Time 1400   OT Time Calculation (min) 43 min   Equipment Utilized During Treatment ultrasound   Activity Tolerance Patient tolerated treatment well   Behavior During Therapy [facility] for tasks assessed/performed      Past Medical History:  Diagnosis Date  . Arthritis   . Chronic headache   . Complication of anesthesia    allergy to Novocaine   . Diabetes mellitus   . GERD (gastroesophageal reflux disease)   . Heart murmur   . High cholesterol   . Hyperlipidemia   . Hypertension   . Intracranial atherosclerosis    per MRA  . Migraine   . Narcolepsy and cataplexy   . OSA on CPAP    residual AHI of-1.1, CPAPevery night   . Rectal prolapse     Past Surgical History:  Procedure Laterality Date  . ABDOMINAL HYSTERECTOMY    . BACK SURGERY     L4,L5 discectomy  . CARDIAC CATHETERIZATION    . CARPAL TUNNEL RELEASE Right 01/18/2017  . KIDNEY SURGERY  1998   Right.  growth removed  . rectal prolapse repair  12/05/11  . ROTATOR CUFF REPAIR     Left  . SHOULDER ARTHROSCOPY WITH SUBACROMIAL DECOMPRESSION, ROTATOR CUFF REPAIR AND BICEP TENDON REPAIR Left 05/25/2015   Procedure: SHOULDER ARTHROSCOPY WITH SUBACROMIAL DECOMPRESSION, ROTATOR CUFF REPAIR AND BICEP TENODESIS, DEBRIDEMENT. ;  Surgeon: Meredith Pel, MD;  Location: [facility];   Service: Orthopedics;  Laterality: Left;  LEFT SHOULDER ROTATOR CUFF TEAR REPAIR, ARTHROSCOPY, DEBRIDEMENT, BICEPS TENODESIS, SUBACROMIAL DECOMPRESSION.  . TOTAL ABDOMINAL HYSTERECTOMY    . TUBAL LIGATION      There were no vitals filed for this visit.      Subjective Assessment - 04/19/17 1326    Subjective  (P)  It gave me a fit last night-    Currently in Pain? (P)  Yes   Pain Score (P)  7    Pain Location (P)  Hand   Pain Orientation (P)  Right   Pain Descriptors / Indicators (P)  Aching   Pain Type (P)  Chronic pain   Pain Onset (P)  More than a month ago   Pain Frequency (P)  Constant                      OT Treatments/Exercises (OP) - 04/19/17 0001      ADLs   Grooming Patient now able to use her right hand to assist with hair care, grooming.     ADL Comments Patient with increased pain from last session.  Patient with stiffness in extensor mechanism for digits 3,4.  Discussed alternating coordination exercises and strength  exercises at this time to reduce strain / overuse.       Ultrasound   Ultrasound Location  Volar surface right wrist   Ultrasound Parameters continuous, 5cm2, 85mhz, 0.8 w/cm2   Ultrasound Goals Pain;Edema     Manual Therapy   Manual Therapy Soft tissue mobilization;Edema management;Manual Traction   Manual therapy comments Patient scar much more pliable except at ulnar aspect proximal to volar crease.  Patient still point tender.  Deep pressure with cross friction following ultrasound to further break up adhesions.  Patient with immediate reduction in pain (from 7/10-2/10) and improved tisue motility.                  OT Education - 04/19/17 1414    Education provided Yes   Education Details increase size of scar pad to cover ulnar aspect of scar, wrist/digit extension stretch, alternate strengthening and coordiantion exercises each day   Person(s) Educated Patient   Methods Explanation;Demonstration;Tactile cues;Verbal cues    Comprehension Verbalized understanding;Returned demonstration          OT Short Term Goals - 04/17/17 1538      OT SHORT TERM GOAL #1   Title Patient will complete home exercise program designed to improve coordination right hand DUE 04/20/17   Status On-going     OT SHORT TERM GOAL #2   Title Patient will demonstrate improved active range of motion in right wrist extension to 45 degrees to aide with simple cooking tasks   Status Achieved     OT SHORT TERM GOAL #3   Title Patient will demonstrate improved grasp strength to 30 lbs to improve her ability to open screw top bottles   Status Achieved           OT Long Term Goals - 04/17/17 [place] #1   Title Patient will complete HEP designed to improve hand strength due 05/20/17   Status Achieved     OT LONG TERM GOAL #2   Title Patient will demonstrate improved active wrist extension to 60 degrees to aide  with IADL- cooking, housekeeping   Status On-going     OT LONG TERM GOAL #3   Title Patient will legibly write at paragraph level without pain or fatigue   Status Achieved     OT LONG TERM GOAL #4   Title Patient will demonstrate improved coordination on 9hole peg test by 3 seconds right hand   Time 8   Period Weeks   Status Achieved     OT LONG TERM GOAL #5   Title Patient will report no greater pain at rest (in right wrist / hand) than 3/10   Status On-going               Plan - 04/19/17 1416    Clinical Impression Statement Patient with increased pain volar surface of wrist.  Working aggressively to reduce scar adhesions remaining.  Patient overall with improved pain, range of motion, and coordination in right hand.     OT Frequency 2x / week   OT Duration 8 weeks   OT Treatment/Interventions Self-care/ADL training;Cryotherapy;Electrical Stimulation;Moist Heat;Fluidtherapy;Parrafin;Ultrasound;Therapeutic exercise;Neuromuscular education;DME and/or AE instruction;Passive range of  motion;Scar mobilization;Manual Therapy;Splinting;Therapeutic activities;Patient/family education   Plan aggressive scar management, follow up from MD visit, wrist biomecahnics for improved wrist extension   OT Home Exercise Plan coordination right UE, strength right hand   Consulted and Agree with Plan of Care Patient      Patient will benefit from skilled therapeutic intervention in order to improve the following deficits and impairments:  Decreased coordination, Decreased range  of motion, Decreased skin integrity, Decreased scar mobility, Decreased strength, Increased edema, Impaired UE functional use, Pain, Impaired sensation  Visit Diagnosis: Stiffness of right hand, not elsewhere classified  Stiffness of right wrist, not elsewhere classified  Pain in joint of right hand  Pain in right wrist  Other lack of coordination  Other disturbances of skin sensation  Muscle weakness (generalized)    Problem List Patient Active Problem List   Diagnosis Date Noted  . Wound infection after surgery 01/30/2017  . Palpitations 08/24/2016  . Noncompliance with CPAP treatment 12/28/2015  . Poor compliance with CPAP treatment 10/26/2014  . CPAP use counseling 07/28/2014  . OSA on CPAP 07/28/2014  . 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 Hypersomnolent 04/02/2014  . Obstructive sleep apnea 04/02/2014  . Non-compliance with treatment 03/05/2014  . Diabetes mellitus 12/05/2011  . Hypertension 12/05/2011  . GERD (gastroesophageal reflux disease)   . 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 Chronic cough 07/26/2011    Mariah Milling, OTR/L 04/19/2017, 2:24 PM  [facility] [address] [place], [place], [address] Phone: [phone]   Fax:  [phone]  Name: CONCHETTA LAMIA MRN: [id number] Date of Birth: 30-Apr-1941

## 2017-04-24 ENCOUNTER — Ambulatory Visit: Payer: Medicare Other | Admitting: Occupational Therapy

## 2017-04-24 ENCOUNTER — Encounter: Payer: Self-pay | Admitting: Occupational Therapy

## 2017-04-24 DIAGNOSIS — M25631 Stiffness of right wrist, not elsewhere classified: Secondary | ICD-10-CM

## 2017-04-24 DIAGNOSIS — M25531 Pain in right wrist: Secondary | ICD-10-CM

## 2017-04-24 DIAGNOSIS — R208 Other disturbances of skin sensation: Secondary | ICD-10-CM | POA: Diagnosis not present

## 2017-04-24 DIAGNOSIS — R278 Other lack of coordination: Secondary | ICD-10-CM | POA: Diagnosis not present

## 2017-04-24 DIAGNOSIS — M25541 Pain in joints of right hand: Secondary | ICD-10-CM

## 2017-04-24 DIAGNOSIS — I10 Essential (primary) hypertension: Secondary | ICD-10-CM | POA: Diagnosis not present

## 2017-04-24 DIAGNOSIS — M6281 Muscle weakness (generalized): Secondary | ICD-10-CM

## 2017-04-24 DIAGNOSIS — Z6826 Body mass index (BMI) 26.0-26.9, adult: Secondary | ICD-10-CM | POA: Diagnosis not present

## 2017-04-24 DIAGNOSIS — M25641 Stiffness of right hand, not elsewhere classified: Secondary | ICD-10-CM | POA: Diagnosis not present

## 2017-04-24 DIAGNOSIS — G5601 Carpal tunnel syndrome, right upper limb: Secondary | ICD-10-CM | POA: Diagnosis not present

## 2017-04-24 NOTE — Therapy (Signed)
[facility] [address] [place], [place], [address] Phone: [phone]   Fax:  [phone]  Occupational Therapy Treatment  Patient Details  Name: Stephanie Barber MRN: [id number] Date of Birth: [birth year] Referring Provider: Dr Christella Noa  Encounter Date: 04/24/2017      OT End of Session - 04/24/17 1233    Visit Number 7   Number of Visits 16   Date for OT Re-Evaluation 05/20/17   Authorization Type mcare trad- primary   Authorization Time Period gcode and pn every 10th visit   Authorization - Visit Number 7   Authorization - Number of Visits 10   OT Start Time 5643   OT Stop Time 1225   OT Time Calculation (min) 41 min   Activity Tolerance Patient tolerated treatment well   Behavior During Therapy [facility] for tasks assessed/performed      Past Medical History:  Diagnosis Date  . Arthritis   . Chronic headache   . Complication of anesthesia    allergy to Novocaine   . Diabetes mellitus   . GERD (gastroesophageal reflux disease)   . Heart murmur   . High cholesterol   . Hyperlipidemia   . Hypertension   . Intracranial atherosclerosis    per MRA  . Migraine   . Narcolepsy and cataplexy   . OSA on CPAP    residual AHI of-1.1, CPAPevery night   . Rectal prolapse     Past Surgical History:  Procedure Laterality Date  . ABDOMINAL HYSTERECTOMY    . BACK SURGERY     L4,L5 discectomy  . CARDIAC CATHETERIZATION    . CARPAL TUNNEL RELEASE Right 01/18/2017  . KIDNEY SURGERY  1998   Right.  growth removed  . rectal prolapse repair  12/05/11  . ROTATOR CUFF REPAIR     Left  . SHOULDER ARTHROSCOPY WITH SUBACROMIAL DECOMPRESSION, ROTATOR CUFF REPAIR AND BICEP TENDON REPAIR Left 05/25/2015   Procedure: SHOULDER ARTHROSCOPY WITH SUBACROMIAL DECOMPRESSION, ROTATOR CUFF REPAIR AND BICEP TENODESIS, DEBRIDEMENT. ;  Surgeon: Meredith Pel, MD;  Location: [facility];  Service: Orthopedics;  Laterality: Left;  LEFT  SHOULDER ROTATOR CUFF TEAR REPAIR, ARTHROSCOPY, DEBRIDEMENT, BICEPS TENODESIS, SUBACROMIAL DECOMPRESSION.  . TOTAL ABDOMINAL HYSTERECTOMY    . TUBAL LIGATION      There were no vitals filed for this visit.      Subjective Assessment - 04/24/17 1152    Subjective  I saw the doctor today- he is going to give me a shot to help with pain and inflammation.  He gave me a night brace to wear.     Currently in Pain? Yes   Pain Score 8    Pain Location Hand   Pain Orientation Right   Pain Descriptors / Indicators Aching   Pain Type Chronic pain   Pain Onset More than a month ago   Pain Frequency Other (Comment)  mostly at night   Aggravating Factors  night time   Pain Relieving Factors massage   Effect of Pain on Daily Activities limits ADL/IADL                      OT Treatments/Exercises (OP) - 04/24/17 0001      ADLs   ADL Comments Reviewed sleeping positions and support of right arm to elevate to help control am edema/ night time pain.       Hand Exercises   Digiticizer up to 7 lbs, x 10 reps, hold  for  3 count   Other Hand Exercises Strengthening right hand - gripper on second setting x 20 blocks without difficulty.       Ultrasound   Ultrasound Location volar and ulnar surface right hand and wrist   Ultrasound Parameters continuous, 5cm2, 77mhz,0.8w/cm2 10 min   Ultrasound Goals Edema;Pain;Other (Comment)  scar management     Manual Therapy   Manual Therapy Other (comment);Soft tissue mobilization;Myofascial release  scar massage                OT Education - 04/24/17 1233    Education provided Yes   Education Details positioning for sleep, progress toward goals   Person(s) Educated Patient   Methods Explanation;Demonstration;Verbal cues   Comprehension Verbalized understanding          OT Short Term Goals - 04/24/17 1236      OT SHORT TERM GOAL #1   Title Patient will complete home exercise program designed to improve coordination right  hand DUE 04/20/17   Status Achieved     OT SHORT TERM GOAL #2   Title Patient will demonstrate improved active range of motion in right wrist extension to 45 degrees to aide with simple cooking tasks   Status Achieved     OT SHORT TERM GOAL #3   Title Patient will demonstrate improved grasp strength to 30 lbs to improve her ability to open screw top bottles   Status Achieved           OT Long Term Goals - 04/24/17 1236      OT [place] #1   Title Patient will complete HEP designed to improve hand strength due 05/20/17   Status Achieved     OT LONG TERM GOAL #2   Title Patient will demonstrate improved active wrist extension to 60 degrees to aide  with IADL- cooking, housekeeping   Status On-going     OT LONG TERM GOAL #3   Title Patient will legibly write at paragraph level without pain or fatigue   Status Achieved     OT LONG TERM GOAL #4   Title Patient will demonstrate improved coordination on 9hole peg test by 3 seconds right hand   Status Achieved     OT LONG TERM GOAL #5   Title Patient will report no greater pain at rest (in right wrist / hand) than 3/10   Status On-going               Plan - 04/24/17 1234    Clinical Impression Statement Patient saw MD today, and indicates he plans to inject right wrist/hand to address inflammation / pain.  Patient continues to steadily progress toward OT long term goals, with the exception of fluctuating pain - now mostly at night.     Rehab Potential Good   OT Frequency 2x / week   OT Duration 8 weeks   OT Treatment/Interventions Self-care/ADL training;Cryotherapy;Electrical Stimulation;Moist Heat;Fluidtherapy;Parrafin;Ultrasound;Therapeutic exercise;Neuromuscular education;DME and/or AE instruction;Passive range of motion;Scar mobilization;Manual Therapy;Splinting;Therapeutic activities;Patient/family education   Plan scar management, wrist / hand strengthening, wrist mecahnics for improved extension   OT Home  Exercise Plan coordination right UE, strength right hand   Consulted and Agree with Plan of Care Patient      Patient will benefit from skilled therapeutic intervention in order to improve the following deficits and impairments:  Decreased coordination, Decreased range of motion, Decreased skin integrity, Decreased scar mobility, Decreased strength, Increased edema, Impaired UE functional use, Pain, Impaired sensation  Visit Diagnosis: Stiffness  of right hand, not elsewhere classified  Stiffness of right wrist, not elsewhere classified  Pain in joint of right hand  Pain in right wrist  Other lack of coordination  Other disturbances of skin sensation  Muscle weakness (generalized)    Problem List Patient Active Problem List   Diagnosis Date Noted  . Wound infection after surgery 01/30/2017  . Palpitations 08/24/2016  . Noncompliance with CPAP treatment 12/28/2015  . Poor compliance with CPAP treatment 10/26/2014  . CPAP use counseling 07/28/2014  . OSA on CPAP 07/28/2014  . 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 Hypersomnolent 04/02/2014  . Obstructive sleep apnea 04/02/2014  . Non-compliance with treatment 03/05/2014  . Diabetes mellitus 12/05/2011  . Hypertension 12/05/2011  . GERD (gastroesophageal reflux disease)   . 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 Chronic cough 07/26/2011    Mariah Milling, OTR/L 04/24/2017, 12:37 PM  [facility] [address] [place], [place], [address] Phone: [phone]   Fax:  [phone]  Name: HENSLEY AZIZ MRN: [id number] Date of Birth: [birth year]

## 2017-04-27 ENCOUNTER — Ambulatory Visit: Payer: Medicare Other | Admitting: Occupational Therapy

## 2017-04-27 ENCOUNTER — Encounter: Payer: Self-pay | Admitting: Occupational Therapy

## 2017-04-27 DIAGNOSIS — M25541 Pain in joints of right hand: Secondary | ICD-10-CM

## 2017-04-27 DIAGNOSIS — R208 Other disturbances of skin sensation: Secondary | ICD-10-CM

## 2017-04-27 DIAGNOSIS — R278 Other lack of coordination: Secondary | ICD-10-CM | POA: Diagnosis not present

## 2017-04-27 DIAGNOSIS — M25531 Pain in right wrist: Secondary | ICD-10-CM | POA: Diagnosis not present

## 2017-04-27 DIAGNOSIS — M25631 Stiffness of right wrist, not elsewhere classified: Secondary | ICD-10-CM

## 2017-04-27 DIAGNOSIS — M25641 Stiffness of right hand, not elsewhere classified: Secondary | ICD-10-CM | POA: Diagnosis not present

## 2017-04-27 NOTE — Therapy (Signed)
[facility] [address] [place], [place], [address] Phone: [phone]   Fax:  [phone]  Occupational Therapy Treatment  Patient Details  Name: Stephanie Barber MRN: [id number] Date of Birth: 1941/08/26 Referring Provider: Dr Christella Noa  Encounter Date: 04/27/2017      OT End of Session - 04/27/17 1619    Visit Number 8   Number of Visits 16   Date for OT Re-Evaluation 05/20/17   Authorization Type mcare trad- primary   Authorization Time Period gcode and pn every 10th visit   Authorization - Visit Number 8   Authorization - Number of Visits 10   OT Start Time 8003   OT Stop Time 1613   OT Time Calculation (min) 41 min   Equipment Utilized During Treatment ultrasound   Activity Tolerance Patient tolerated treatment well   Behavior During Therapy [facility] for tasks assessed/performed      Past Medical History:  Diagnosis Date  . Arthritis   . Chronic headache   . Complication of anesthesia    allergy to Novocaine   . Diabetes mellitus   . GERD (gastroesophageal reflux disease)   . Heart murmur   . High cholesterol   . Hyperlipidemia   . Hypertension   . Intracranial atherosclerosis    per MRA  . Migraine   . Narcolepsy and cataplexy   . OSA on CPAP    residual AHI of-1.1, CPAPevery night   . Rectal prolapse     Past Surgical History:  Procedure Laterality Date  . ABDOMINAL HYSTERECTOMY    . BACK SURGERY     L4,L5 discectomy  . CARDIAC CATHETERIZATION    . CARPAL TUNNEL RELEASE Right 01/18/2017  . KIDNEY SURGERY  1998   Right.  growth removed  . rectal prolapse repair  12/05/11  . ROTATOR CUFF REPAIR     Left  . SHOULDER ARTHROSCOPY WITH SUBACROMIAL DECOMPRESSION, ROTATOR CUFF REPAIR AND BICEP TENDON REPAIR Left 05/25/2015   Procedure: SHOULDER ARTHROSCOPY WITH SUBACROMIAL DECOMPRESSION, ROTATOR CUFF REPAIR AND BICEP TENODESIS, DEBRIDEMENT. ;  Surgeon: Meredith Pel, MD;  Location: [facility];   Service: Orthopedics;  Laterality: Left;  LEFT SHOULDER ROTATOR CUFF TEAR REPAIR, ARTHROSCOPY, DEBRIDEMENT, BICEPS TENODESIS, SUBACROMIAL DECOMPRESSION.  . TOTAL ABDOMINAL HYSTERECTOMY    . TUBAL LIGATION      There were no vitals filed for this visit.      Subjective Assessment - 04/27/17 1615    Subjective  I rested it up on the pillows   Currently in Pain? Yes   Pain Score 5    Pain Location Hand   Pain Orientation Right   Pain Descriptors / Indicators Aching   Pain Type Chronic pain   Pain Onset More than a month ago                      OT Treatments/Exercises (OP) - 04/27/17 0001      Wrist Exercises   Other wrist exercises standing weight bearing over extended elbows, palms flat on tabletop     Ultrasound   Ultrasound Location volar surface of right wrist   Ultrasound Parameters continuous, 5cm2, 11mhz, 0.8 w/cm2 10 min   Ultrasound Goals Edema;Pain     Manual Therapy   Manual therapy comments Deep pressure with cross friction for scar management   Joint Mobilization carpals right                OT Education - 04/27/17 4917  Education provided Yes   Education Details standing wrist stretch   Person(s) Educated Patient   Methods Explanation;Demonstration   Comprehension Verbalized understanding;Returned demonstration          OT Short Term Goals - 04/24/17 1236      OT SHORT TERM GOAL #1   Title Patient will complete home exercise program designed to improve coordination right hand DUE 04/20/17   Status Achieved     OT SHORT TERM GOAL #2   Title Patient will demonstrate improved active range of motion in right wrist extension to 45 degrees to aide with simple cooking tasks   Status Achieved     OT SHORT TERM GOAL #3   Title Patient will demonstrate improved grasp strength to 30 lbs to improve her ability to open screw top bottles   Status Achieved           OT Long Term Goals - 04/24/17 1236      OT LONG TERM GOAL #1    Title Patient will complete HEP designed to improve hand strength due 05/20/17   Status Achieved     OT LONG TERM GOAL #2   Title Patient will demonstrate improved active wrist extension to 60 degrees to aide  with IADL- cooking, housekeeping   Status On-going     OT LONG TERM GOAL #3   Title Patient will legibly write at paragraph level without pain or fatigue   Status Achieved     OT LONG TERM GOAL #4   Title Patient will demonstrate improved coordination on 9hole peg test by 3 seconds right hand   Status Achieved     OT LONG TERM GOAL #5   Title Patient will report no greater pain at rest (in right wrist / hand) than 3/10   Status On-going               Plan - 04/27/17 1619    Clinical Impression Statement Patient is experiencing decrease in symptoms each session, but is still experiencing pain in right hand.  Patient scheduled to receive hand injections on Tuesday 6/19   OT Frequency 2x / week   OT Duration 8 weeks   OT Treatment/Interventions Self-care/ADL training;Cryotherapy;Electrical Stimulation;Moist Heat;Fluidtherapy;Parrafin;Ultrasound;Therapeutic exercise;Neuromuscular education;DME and/or AE instruction;Passive range of motion;Scar mobilization;Manual Therapy;Splinting;Therapeutic activities;Patient/family education   Plan wrist mechanics, scar management, wrist/hand strengthening if pain permits,   OT Home Exercise Plan coordination right UE, strength right hand, stretching wrist   Consulted and Agree with Plan of Care Patient      Patient will benefit from skilled therapeutic intervention in order to improve the following deficits and impairments:  Decreased coordination, Decreased range of motion, Decreased skin integrity, Decreased scar mobility, Decreased strength, Increased edema, Impaired UE functional use, Pain, Impaired sensation  Visit Diagnosis: Stiffness of right hand, not elsewhere classified  Stiffness of right wrist, not elsewhere  classified  Pain in joint of right hand  Pain in right wrist  Other lack of coordination  Other disturbances of skin sensation    Problem List Patient Active Problem List   Diagnosis Date Noted  . Wound infection after surgery 01/30/2017  . Palpitations 08/24/2016  . Noncompliance with CPAP treatment 12/28/2015  . Poor compliance with CPAP treatment 10/26/2014  . CPAP use counseling 07/28/2014  . OSA on CPAP 07/28/2014  . 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 Hypersomnolent 04/02/2014  . Obstructive sleep apnea 04/02/2014  . Non-compliance with treatment 03/05/2014  . Diabetes mellitus 12/05/2011  . Hypertension 12/05/2011  .  GERD (gastroesophageal reflux disease)   . 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 Chronic cough 07/26/2011    Stephanie Barber , OTR/L 04/27/2017, 4:22 PM  [facility] [address] [place], [place], [address] Phone: [phone]   Fax:  [phone]  Name: Stephanie Barber MRN: [id number] Date of Birth: 11/26/1940

## 2017-05-01 ENCOUNTER — Encounter: Payer: Medicare Other | Admitting: Occupational Therapy

## 2017-05-01 DIAGNOSIS — G5601 Carpal tunnel syndrome, right upper limb: Secondary | ICD-10-CM | POA: Diagnosis not present

## 2017-05-03 ENCOUNTER — Ambulatory Visit: Payer: Medicare Other | Admitting: Occupational Therapy

## 2017-05-03 ENCOUNTER — Encounter: Payer: Self-pay | Admitting: Occupational Therapy

## 2017-05-03 DIAGNOSIS — R208 Other disturbances of skin sensation: Secondary | ICD-10-CM

## 2017-05-03 DIAGNOSIS — M25531 Pain in right wrist: Secondary | ICD-10-CM

## 2017-05-03 DIAGNOSIS — M25641 Stiffness of right hand, not elsewhere classified: Secondary | ICD-10-CM | POA: Diagnosis not present

## 2017-05-03 DIAGNOSIS — M25631 Stiffness of right wrist, not elsewhere classified: Secondary | ICD-10-CM

## 2017-05-03 DIAGNOSIS — R278 Other lack of coordination: Secondary | ICD-10-CM

## 2017-05-03 DIAGNOSIS — M25541 Pain in joints of right hand: Secondary | ICD-10-CM | POA: Diagnosis not present

## 2017-05-03 NOTE — Therapy (Signed)
[facility] [address] [place], [place], [address] Phone: [phone]   Fax:  [phone]  Occupational Therapy Treatment  Patient Details  Name: Stephanie Barber MRN: [id number] Date of Birth: 04-24-1941 Referring Provider: Dr Christella Noa  Encounter Date: 05/03/2017      OT End of Session - 05/03/17 1616    Visit Number 9   Number of Visits 16   Date for OT Re-Evaluation 05/20/17   Authorization Type mcare trad- primary   Authorization Time Period gcode and pn every 10th visit   Authorization - Visit Number 9   Authorization - Number of Visits 10   OT Start Time 4315   OT Stop Time 1612   OT Time Calculation (min) 42 min   Equipment Utilized During Treatment fluidotherapy   Activity Tolerance Patient tolerated treatment well   Behavior During Therapy [facility] for tasks assessed/performed      Past Medical History:  Diagnosis Date  . Arthritis   . Chronic headache   . Complication of anesthesia    allergy to Novocaine   . Diabetes mellitus   . GERD (gastroesophageal reflux disease)   . Heart murmur   . High cholesterol   . Hyperlipidemia   . Hypertension   . Intracranial atherosclerosis    per MRA  . Migraine   . Narcolepsy and cataplexy   . OSA on CPAP    residual AHI of-1.1, CPAPevery night   . Rectal prolapse     Past Surgical History:  Procedure Laterality Date  . ABDOMINAL HYSTERECTOMY    . BACK SURGERY     L4,L5 discectomy  . CARDIAC CATHETERIZATION    . CARPAL TUNNEL RELEASE Right 01/18/2017  . KIDNEY SURGERY  1998   Right.  growth removed  . rectal prolapse repair  12/05/11  . ROTATOR CUFF REPAIR     Left  . SHOULDER ARTHROSCOPY WITH SUBACROMIAL DECOMPRESSION, ROTATOR CUFF REPAIR AND BICEP TENDON REPAIR Left 05/25/2015   Procedure: SHOULDER ARTHROSCOPY WITH SUBACROMIAL DECOMPRESSION, ROTATOR CUFF REPAIR AND BICEP TENODESIS, DEBRIDEMENT. ;  Surgeon: Meredith Pel, MD;  Location: [facility];   Service: Orthopedics;  Laterality: Left;  LEFT SHOULDER ROTATOR CUFF TEAR REPAIR, ARTHROSCOPY, DEBRIDEMENT, BICEPS TENODESIS, SUBACROMIAL DECOMPRESSION.  . TOTAL ABDOMINAL HYSTERECTOMY    . TUBAL LIGATION      There were no vitals filed for this visit.      Subjective Assessment - 05/03/17 1544    Subjective  I got that shot this week - that was something!   Currently in Pain? Yes   Pain Score 6    Pain Location Hand   Pain Orientation Right   Pain Descriptors / Indicators Aching;Numbness   Pain Type Chronic pain   Pain Onset More than a month ago   Pain Frequency Constant   Aggravating Factors  worse in morning   Pain Relieving Factors heat, massage   Effect of Pain on Daily Activities limits ADL/IADL   Multiple Pain Sites No                      OT Treatments/Exercises (OP) - 05/03/17 0001      ADLs   Work Patient was a Technical brewer for years, and still works in Music therapist although less intensely.  Had overt discussion about avoiding repetitive or power activities with right hand to help reduce overall pain and stiffness..  Discussed wearing night brace during the day during rest periods as well.  RUE Fluidotherapy   Number Minutes Fluidotherapy 15 Minutes   RUE Fluidotherapy Location Wrist;Hand;Forearm   Comments gentle range of motion during fluidotherapy.       Manual Therapy   Manual Therapy Edema management;Soft tissue mobilization;Other (comment)  scar management   Manual therapy comments Deep pressure with cross friction for scar management                OT Education - 05/03/17 1615    Education provided Yes   Education Details reducing power and repetitive movements of right hand / wrist   Person(s) Educated Patient   Methods Explanation;Demonstration   Comprehension Verbalized understanding          OT Short Term Goals - 04/24/17 1236      OT SHORT TERM GOAL #1   Title Patient will complete home exercise program designed to  improve coordination right hand DUE 04/20/17   Status Achieved     OT SHORT TERM GOAL #2   Title Patient will demonstrate improved active range of motion in right wrist extension to 45 degrees to aide with simple cooking tasks   Status Achieved     OT SHORT TERM GOAL #3   Title Patient will demonstrate improved grasp strength to 30 lbs to improve her ability to open screw top bottles   Status Achieved           OT Long Term Goals - 04/24/17 1236      OT LONG TERM GOAL #1   Title Patient will complete HEP designed to improve hand strength due 05/20/17   Status Achieved     OT LONG TERM GOAL #2   Title Patient will demonstrate improved active wrist extension to 60 degrees to aide  with IADL- cooking, housekeeping   Status On-going     OT LONG TERM GOAL #3   Title Patient will legibly write at paragraph level without pain or fatigue   Status Achieved     OT LONG TERM GOAL #4   Title Patient will demonstrate improved coordination on 9hole peg test by 3 seconds right hand   Status Achieved     OT LONG TERM GOAL #5   Title Patient will report no greater pain at rest (in right wrist / hand) than 3/10   Status On-going               Plan - 05/03/17 1618    Clinical Impression Statement Patient received injection to right palm this week.  (This may be first in series of 3 injections)  Patient with imporved tissue motility around scar, yet pain and numbness persist.     Rehab Potential Good   OT Frequency 2x / week   OT Duration 8 weeks   OT Treatment/Interventions Self-care/ADL training;Cryotherapy;Electrical Stimulation;Moist Heat;Fluidtherapy;Parrafin;Ultrasound;Therapeutic exercise;Neuromuscular education;DME and/or AE instruction;Passive range of motion;Scar mobilization;Manual Therapy;Splinting;Therapeutic activities;Patient/family education   Plan fluidotherapy, scar management, wrist mechanics   OT Home Exercise Plan coordination right UE, strength right hand,  stretching wrist- holding off on hand strengthening at this time   Consulted and Agree with Plan of Care Patient      Patient will benefit from skilled therapeutic intervention in order to improve the following deficits and impairments:  Decreased coordination, Decreased range of motion, Decreased skin integrity, Decreased scar mobility, Decreased strength, Increased edema, Impaired UE functional use, Pain, Impaired sensation  Visit Diagnosis: Stiffness of right hand, not elsewhere classified  Stiffness of right wrist, not elsewhere classified  Pain in joint of  right hand  Pain in right wrist  Other lack of coordination  Other disturbances of skin sensation    Problem List Patient Active Problem List   Diagnosis Date Noted  . Wound infection after surgery 01/30/2017  . Palpitations 08/24/2016  . Noncompliance with CPAP treatment 12/28/2015  . Poor compliance with CPAP treatment 10/26/2014  . CPAP use counseling 07/28/2014  . OSA on CPAP 07/28/2014  . 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 Hypersomnolent 04/02/2014  . Obstructive sleep apnea 04/02/2014  . Non-compliance with treatment 03/05/2014  . Diabetes mellitus 12/05/2011  . Hypertension 12/05/2011  . GERD (gastroesophageal reflux disease)   . 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 Chronic cough 07/26/2011    Mariah Milling, OTR/L 05/03/2017, 4:23 PM  [facility] [address] [place], [place], [address] Phone: [phone]   Fax:  [phone]  Name: Stephanie Barber MRN: [id number] Date of Birth: 01/17/1941

## 2017-05-06 ENCOUNTER — Emergency Department (HOSPITAL_COMMUNITY): Payer: Medicare Other

## 2017-05-06 ENCOUNTER — Encounter (HOSPITAL_COMMUNITY): Payer: Self-pay | Admitting: Emergency Medicine

## 2017-05-06 ENCOUNTER — Observation Stay (HOSPITAL_COMMUNITY)
Admission: EM | Admit: 2017-05-06 | Discharge: 2017-05-07 | Disposition: A | Discharge status: Home / Self Care | Payer: Medicare Other | Attending: Family Medicine | Admitting: Family Medicine

## 2017-05-06 DIAGNOSIS — G4733 Obstructive sleep apnea (adult) (pediatric): Secondary | ICD-10-CM | POA: Insufficient documentation

## 2017-05-06 DIAGNOSIS — D649 Anemia, unspecified: Secondary | ICD-10-CM | POA: Diagnosis not present

## 2017-05-06 DIAGNOSIS — E119 Type 2 diabetes mellitus without complications: Secondary | ICD-10-CM | POA: Insufficient documentation

## 2017-05-06 DIAGNOSIS — E860 Dehydration: Secondary | ICD-10-CM | POA: Diagnosis present

## 2017-05-06 DIAGNOSIS — Z884 Allergy status to anesthetic agent status: Secondary | ICD-10-CM | POA: Insufficient documentation

## 2017-05-06 DIAGNOSIS — E739 Lactose intolerance, unspecified: Secondary | ICD-10-CM | POA: Diagnosis not present

## 2017-05-06 DIAGNOSIS — K219 Gastro-esophageal reflux disease without esophagitis: Secondary | ICD-10-CM | POA: Diagnosis not present

## 2017-05-06 DIAGNOSIS — G4710 Hypersomnia, unspecified: Secondary | ICD-10-CM | POA: Insufficient documentation

## 2017-05-06 DIAGNOSIS — G47411 Narcolepsy with cataplexy: Secondary | ICD-10-CM | POA: Insufficient documentation

## 2017-05-06 DIAGNOSIS — E7800 Pure hypercholesterolemia, unspecified: Secondary | ICD-10-CM | POA: Insufficient documentation

## 2017-05-06 DIAGNOSIS — J322 Chronic ethmoidal sinusitis: Secondary | ICD-10-CM | POA: Diagnosis not present

## 2017-05-06 DIAGNOSIS — Z8051 Family history of malignant neoplasm of kidney: Secondary | ICD-10-CM | POA: Insufficient documentation

## 2017-05-06 DIAGNOSIS — N179 Acute kidney failure, unspecified: Secondary | ICD-10-CM | POA: Diagnosis not present

## 2017-05-06 DIAGNOSIS — Z885 Allergy status to narcotic agent status: Secondary | ICD-10-CM | POA: Insufficient documentation

## 2017-05-06 DIAGNOSIS — E785 Hyperlipidemia, unspecified: Secondary | ICD-10-CM | POA: Insufficient documentation

## 2017-05-06 DIAGNOSIS — Z87891 Personal history of nicotine dependence: Secondary | ICD-10-CM | POA: Insufficient documentation

## 2017-05-06 DIAGNOSIS — R079 Chest pain, unspecified: Secondary | ICD-10-CM | POA: Diagnosis present

## 2017-05-06 DIAGNOSIS — Z7984 Long term (current) use of oral hypoglycemic drugs: Secondary | ICD-10-CM | POA: Insufficient documentation

## 2017-05-06 DIAGNOSIS — I10 Essential (primary) hypertension: Secondary | ICD-10-CM | POA: Diagnosis not present

## 2017-05-06 DIAGNOSIS — Z800 Family history of malignant neoplasm of digestive organs: Secondary | ICD-10-CM | POA: Diagnosis not present

## 2017-05-06 DIAGNOSIS — Z7982 Long term (current) use of aspirin: Secondary | ICD-10-CM | POA: Insufficient documentation

## 2017-05-06 DIAGNOSIS — R55 Syncope and collapse: Secondary | ICD-10-CM | POA: Diagnosis present

## 2017-05-06 DIAGNOSIS — R404 Transient alteration of awareness: Secondary | ICD-10-CM | POA: Diagnosis not present

## 2017-05-06 LAB — CBC
HEMATOCRIT: 34.9 % — AB (ref 36.0–46.0)
Hemoglobin: 11.1 g/dL — ABNORMAL LOW (ref 12.0–15.0)
MCH: 28 pg (ref 26.0–34.0)
MCHC: 31.8 g/dL (ref 30.0–36.0)
MCV: 88.1 fL (ref 78.0–100.0)
Platelets: 243 10*3/uL (ref 150–400)
RBC: 3.96 MIL/uL (ref 3.87–5.11)
RDW: 14.1 % (ref 11.5–15.5)
WBC: 6.3 10*3/uL (ref 4.0–10.5)

## 2017-05-06 LAB — BASIC METABOLIC PANEL
ANION GAP: 10 (ref 5–15)
BUN: 18 mg/dL (ref 6–20)
CO2: 18 mmol/L — AB (ref 22–32)
Calcium: 9.9 mg/dL (ref 8.9–10.3)
Chloride: 111 mmol/L (ref 101–111)
Creatinine, Ser: 1.59 mg/dL — ABNORMAL HIGH (ref 0.44–1.00)
GFR calc non Af Amer: 30 mL/min — ABNORMAL LOW (ref >60)
GFR, EST AFRICAN AMERICAN: 35 mL/min — AB (ref >60)
Glucose, Bld: 247 mg/dL — ABNORMAL HIGH (ref 65–99)
POTASSIUM: 4 mmol/L (ref 3.5–5.1)
Sodium: 139 mmol/L (ref 135–145)

## 2017-05-06 LAB — I-STAT TROPONIN, ED: Troponin i, poc: 0 ng/mL (ref 0.00–0.08)

## 2017-05-06 LAB — GLUCOSE, CAPILLARY
GLUCOSE-CAPILLARY: 88 mg/dL (ref 65–99)
Glucose-Capillary: 110 mg/dL — ABNORMAL HIGH (ref 65–99)

## 2017-05-06 LAB — MAGNESIUM: MAGNESIUM: 1.7 mg/dL (ref 1.7–2.4)

## 2017-05-06 LAB — PHOSPHORUS: PHOSPHORUS: 3.4 mg/dL (ref 2.5–4.6)

## 2017-05-06 LAB — TROPONIN I

## 2017-05-06 MED ORDER — ASPIRIN 81 MG PO TABS: 81.0000 mg | ORAL_TABLET | Freq: Every day | ORAL | Status: DC

## 2017-05-06 MED ORDER — HYDRALAZINE HCL 20 MG/ML IJ SOLN: 10.0000 mg | Freq: Three times a day (TID) | INTRAMUSCULAR | Status: DC | PRN

## 2017-05-06 MED ORDER — SODIUM CHLORIDE 0.9 % IV BOLUS (SEPSIS)
1000.0000 mL | Freq: Once | INTRAVENOUS | Status: AC
  Administered 2017-05-06: 1000 mL via INTRAVENOUS

## 2017-05-06 MED ORDER — POLYSACCHARIDE IRON COMPLEX 150 MG PO CAPS
150.0000 mg | ORAL_CAPSULE | Freq: Every day | ORAL | Status: DC
  Administered 2017-05-07: 150 mg via ORAL
  Filled 2017-05-06: qty 1

## 2017-05-06 MED ORDER — ONDANSETRON HCL 4 MG/2ML IJ SOLN: 4.0000 mg | Freq: Four times a day (QID) | INTRAMUSCULAR | Status: DC | PRN

## 2017-05-06 MED ORDER — ENOXAPARIN SODIUM 30 MG/0.3ML ~~LOC~~ SOLN
30.0000 mg | SUBCUTANEOUS | Status: DC
  Administered 2017-05-06: 30 mg via SUBCUTANEOUS
  Filled 2017-05-06: qty 0.3

## 2017-05-06 MED ORDER — NITROGLYCERIN 0.4 MG SL SUBL: 0.4000 mg | SUBLINGUAL_TABLET | SUBLINGUAL | Status: DC | PRN

## 2017-05-06 MED ORDER — INSULIN ASPART 100 UNIT/ML ~~LOC~~ SOLN
0.0000 [IU] | Freq: Three times a day (TID) | SUBCUTANEOUS | Status: DC
Start: 2017-05-06 — End: 2017-05-07
  Administered 2017-05-07: 3 [IU] via SUBCUTANEOUS

## 2017-05-06 MED ORDER — AMLODIPINE BESYLATE 5 MG PO TABS
10.0000 mg | ORAL_TABLET | Freq: Every day | ORAL | Status: DC
  Administered 2017-05-07: 10 mg via ORAL
  Filled 2017-05-06: qty 2

## 2017-05-06 MED ORDER — ASPIRIN EC 325 MG PO TBEC
325.0000 mg | DELAYED_RELEASE_TABLET | Freq: Every day | ORAL | Status: DC
  Administered 2017-05-06 – 2017-05-07 (×2): 325 mg via ORAL
  Filled 2017-05-06 (×2): qty 1

## 2017-05-06 MED ORDER — OMEGA-3-ACID ETHYL ESTERS 1 G PO CAPS
1.0000 g | ORAL_CAPSULE | Freq: Every day | ORAL | Status: DC
  Administered 2017-05-07: 1 g via ORAL
  Filled 2017-05-06: qty 1

## 2017-05-06 MED ORDER — METOPROLOL SUCCINATE ER 50 MG PO TB24
50.0000 mg | ORAL_TABLET | Freq: Every day | ORAL | Status: DC
Start: 2017-05-07 — End: 2017-05-07
  Administered 2017-05-07: 50 mg via ORAL
  Filled 2017-05-06: qty 1

## 2017-05-06 MED ORDER — ACETAMINOPHEN 325 MG PO TABS: 650.0000 mg | ORAL_TABLET | ORAL | Status: DC | PRN

## 2017-05-06 NOTE — ED Notes (Signed)
Family at bedside. 

## 2017-05-06 NOTE — Progress Notes (Signed)
Pt admitted to the unit at 1816. Pt mental status is A&O x 4. Pt oriented to room, staff, and call bell. Skin is intact except where otherwise charted. Full assessment charted in [facility]. Call bell within reach. Visitor guidelines reviewed w/ pt and/or family.

## 2017-05-06 NOTE — ED Provider Notes (Signed)
[facility] DEPT Provider Note   CSN: [id number] Arrival date & time: 05/06/17  1422     History   Chief Complaint Chief Complaint  Patient presents with  . Loss of Consciousness  . Chest Pain    HPI Stephanie Barber is a 76 y.o. female.  The history is provided by the patient.  Loss of Consciousness   This is a new problem. The current episode started 1 to 2 hours ago. The problem occurs rarely. The problem has been resolved. She lost consciousness for a period of less than one minute. The problem is associated with normal activity. Associated symptoms include abdominal pain, chest pain, diaphoresis, light-headedness and nausea. Pertinent negatives include back pain, confusion, focal sensory loss, focal weakness, seizures and vomiting. She has tried nothing for the symptoms. Her past medical history is significant for DM and HTN.   76 year old female who presents with LOC. History of DM, HTN, HLD. Reports cooking outside since 9:30 AM for church function. Reports limited PO intake, only drinking 2 glasses of lemonade during the day. Reports just prior to arrival, began to feel lightheaded, hot, nauseated, flushed. Also developed burning chest pain, dyspnea and epigastric discomfort. Subsequently had LOC for < 1 minute, caught by her granddaughter who was at her side. No seizure activity. No n/v/d recently. No recent illness, including fever or chills.    Past Medical History:  Diagnosis Date  . Arthritis   . Chronic headache   . Complication of anesthesia    allergy to Novocaine   . Diabetes mellitus   . GERD (gastroesophageal reflux disease)   . Heart murmur   . High cholesterol   . Hyperlipidemia   . Hypertension   . Intracranial atherosclerosis    per MRA  . Migraine   . Narcolepsy and cataplexy   . OSA on CPAP    residual AHI of-1.1, CPAPevery night   . Rectal prolapse     Patient Active Problem List   Diagnosis Date Noted  . AKI (acute kidney injury) ([place])  05/06/2017  . Dehydration 05/06/2017  . Chest pain 05/06/2017  . Wound infection after surgery 01/30/2017  . Palpitations 08/24/2016  . Noncompliance with CPAP treatment 12/28/2015  . Poor compliance with CPAP treatment 10/26/2014  . CPAP use counseling 07/28/2014  . OSA on CPAP 07/28/2014  . 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 Hypersomnolent 04/02/2014  . Obstructive sleep apnea 04/02/2014  . Non-compliance with treatment 03/05/2014  . Diabetes mellitus ([place]) 12/05/2011  . Hypertension 12/05/2011  . GERD (gastroesophageal reflux disease)   . 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 Chronic cough 07/26/2011    Past Surgical History:  Procedure Laterality Date  . ABDOMINAL HYSTERECTOMY    . BACK SURGERY     L4,L5 discectomy  . CARDIAC CATHETERIZATION    . CARPAL TUNNEL RELEASE Right 01/18/2017  . KIDNEY SURGERY  1998   Right.  growth removed  . rectal prolapse repair  12/05/11  . ROTATOR CUFF REPAIR     Left  . SHOULDER ARTHROSCOPY WITH SUBACROMIAL DECOMPRESSION, ROTATOR CUFF REPAIR AND BICEP TENDON REPAIR Left 05/25/2015   Procedure: SHOULDER ARTHROSCOPY WITH SUBACROMIAL DECOMPRESSION, ROTATOR CUFF REPAIR AND BICEP TENODESIS, DEBRIDEMENT. ;  Surgeon: Meredith Pel, MD;  Location: [facility];  Service: Orthopedics;  Laterality: Left;  LEFT SHOULDER ROTATOR CUFF TEAR REPAIR, ARTHROSCOPY, DEBRIDEMENT, BICEPS TENODESIS, SUBACROMIAL DECOMPRESSION.  . TOTAL ABDOMINAL HYSTERECTOMY    . TUBAL LIGATION      OB History  No data available       Home Medications    Prior to Admission medications   Medication Sig Start Date End Date Taking? Authorizing Provider  acetaminophen-codeine (TYLENOL #3) 300-30 MG tablet Take 1 tablet by mouth every 6 (six) hours as needed (pain).  01/18/17  Yes [provider]  amLODipine (NORVASC) 10 MG tablet Take 10 mg by mouth daily after breakfast.    Yes [provider]  aspirin EC 81 MG tablet Take 81 mg by mouth at  bedtime.   Yes [provider]  celecoxib (CELEBREX) 200 MG capsule Take 200 mg by mouth at bedtime.    Yes [provider]  fish oil-omega-3 fatty acids 1000 MG capsule Take 1 g by mouth daily.     Yes [provider]  gabapentin (NEURONTIN) 400 MG capsule Take 800 mg by mouth at bedtime.    Yes [provider]  losartan (COZAAR) 100 MG tablet Take 100 mg by mouth daily after breakfast.    Yes [provider]  metFORMIN (GLUCOPHAGE) 500 MG tablet Take 500 mg by mouth 2 (two) times daily with a meal.    Yes [provider]  metoprolol (TOPROL-XL) 50 MG 24 hr tablet Take 50 mg by mouth daily after breakfast.    Yes [provider]  Polyethyl Glycol-Propyl Glycol (SYSTANE ULTRA) 0.4-0.3 % SOLN Place 1 drop into both eyes daily.     Yes [provider]  polysaccharide iron (NIFEREX) 150 MG CAPS capsule Take 150 mg by mouth daily.    Yes [provider]  PRAVASTATIN SODIUM PO Take 1 tablet by mouth daily.   Yes [provider]  ranitidine (ZANTAC) 300 MG capsule Take 300 mg by mouth 2 (two) times daily.    Yes [provider]  methocarbamol (ROBAXIN) 500 MG tablet Take 1 tablet (500 mg total) by mouth 4 (four) times daily. Patient not taking: Reported on 05/06/2017 05/25/15   Meredith Pel, MD  oxyCODONE-acetaminophen (ROXICET) 5-325 MG per tablet Take 1-2 tablets by mouth every 6 (six) hours as needed for severe pain. Patient not taking: Reported on 05/06/2017 05/25/15   Meredith Pel, MD    Family History Family History  Problem Relation Age of Onset  . Heart disease Mother   . Throat cancer Father   . Cancer Father        stomach  . Kidney cancer Brother   . Heart disease Brother     Social History Social History  Substance Use Topics  . Smoking status: Former Smoker    Quit date: 11/13/1980  . Smokeless tobacco: Never Used     Comment: quit in 1982  . Alcohol use No      Allergies   Morphine and related and Novocain [procaine hcl]   Review of Systems Review of Systems  Constitutional: Positive for diaphoresis.  Respiratory: Positive for shortness of breath.   Cardiovascular: Positive for chest pain and syncope.  Gastrointestinal: Positive for abdominal pain and nausea. Negative for vomiting.  Musculoskeletal: Negative for back pain.  Neurological: Positive for light-headedness. Negative for focal weakness and seizures.  Psychiatric/Behavioral: Negative for confusion.     Physical Exam Updated Vital Signs BP 128/79   Pulse 71   Temp 97.9 F (36.6 C) (Oral)   Resp 16   Ht 4\' 11"  (1.499 m)   Wt 57.6 kg (127 lb)   SpO2 100%   BMI 25.65 kg/m   Physical Exam Physical Exam  Nursing note and vitals reviewed. Constitutional: Well developed, well nourished, non-toxic, and in no acute distress Head: Normocephalic and atraumatic.  Mouth/Throat: Oropharynx is clear and moist.  Neck: Normal range of motion. Neck supple.  Cardiovascular: Normal rate and regular rhythm.   Pulmonary/Chest: Effort normal and breath sounds normal.  Abdominal: Soft. There is no tenderness. There is no rebound and no guarding.  Musculoskeletal: Normal range of motion.  Neurological: Alert, no facial droop, fluent speech, moves all extremities symmetrically Skin: Skin is warm and dry.  Psychiatric: Cooperative   ED Treatments / Results  Labs (all labs ordered are listed, but only abnormal results are displayed) Labs Reviewed  BASIC METABOLIC PANEL - Abnormal; Notable for the following:       Result Value   CO2 18 (*)    Glucose, Bld 247 (*)    Creatinine, Ser 1.59 (*)    GFR calc non Af Amer 30 (*)    GFR calc Af Amer 35 (*)    All other components within normal limits  CBC - Abnormal; Notable for the following:    Hemoglobin 11.1 (*)    HCT 34.9 (*)    All other components within normal limits  URINALYSIS, ROUTINE W REFLEX MICROSCOPIC  TROPONIN I   TROPONIN I  TROPONIN I  CBC  CREATININE, SERUM  MAGNESIUM  PHOSPHORUS  I-STAT TROPOININ, ED    EKG  EKG Interpretation  Date/Time:  Sunday May 06 2017 14:34:07 EDT Ventricular Rate:  71 PR Interval:    QRS Duration: 84 QT Interval:  379 QTC Calculation: 412 R Axis:   18 Text Interpretation:  Sinus rhythm Low voltage, precordial leads no acute changes  Confirmed by Amberlie Gaillard ([id number]) on 05/06/2017 4:03:06 PM       Radiology Dg Chest 2 View  Result Date: 05/06/2017 CLINICAL DATA:  Recent syncopal episode EXAM: CHEST  2 VIEW COMPARISON:  08/02/2016 FINDINGS: The heart size and mediastinal contours are within normal limits. Both lungs are clear. The visualized skeletal structures are unremarkable. IMPRESSION: No active cardiopulmonary disease. Electronically Signed   By: Mark  Lukens M.D.   On: 05/06/2017 16:02    Procedures Procedures (including critical care time)  Medications Ordered in ED Medications  fish oil-omega-3 fatty acids capsule 1 g (not administered)  iron polysaccharides (NIFEREX) capsule 150 mg (not administered)  amLODipine (NORVASC) tablet 10 mg (not administered)  metoprolol succinate (TOPROL-XL) 24 hr tablet 50 mg (not administered)  acetaminophen (TYLENOL) tablet 650 mg (not administered)  ondansetron (ZOFRAN) injection 4 mg (not administered)  enoxaparin (LOVENOX) injection 40 mg (not administered)  aspirin EC tablet 325 mg (not administered)  hydrALAZINE (APRESOLINE) injection 10 mg (not administered)  insulin aspart (novoLOG) injection 0-9 Units (not administered)  nitroGLYCERIN (NITROSTAT) SL tablet 0.4 mg (not administered)  sodium chloride 0.9 % bolus 1,000 mL (1,000 mLs Intravenous New Bag/Given 05/06/17 1601)     Initial Impression / Assessment and Plan / ED Course  I have reviewed the triage vital signs and the nursing notes.  Pertinent labs & imaging results that were available during my care of the patient were reviewed by me and  considered in my medical decision making (see chart for details).     76  year old female who presents with LOC. In ED, well appearing. Normal vitals. Currently asymptomatic. Possible dehydration by history causing orthostasis. EKg w/o stigmata of arrhythmia. There is AKI on blood work. Troponin 1 is normal. Orthostatic vital signs are normal. Given her age, multiple comorbidities,  with chest pain during syncope, discussed observation in the hospital for syncope evaluation to r/o arrythmogenic and cardiogenic etiologies. Discussed with hospitialist service who will admit.   Final Clinical Impressions(s) / ED Diagnoses   Final diagnoses:  Acute kidney injury ([place])  Syncope and collapse    New Prescriptions New Prescriptions   No medications on file     Forde Dandy, MD 05/06/17 1755

## 2017-05-06 NOTE — H&P (Signed)
[facility] History and Physical  SHEKIA KUPER PFX:[id number] DOB: 30-Aug-1941 DOA: 05/06/2017  Referring physician:  PCP: [name], MD   Chief Complaint: syncope  HPI: Stephanie Barber is a 76 y.o. female  with past medical history significant for diabetes, hyperlipidemia, hypertension who presents with complaint of syncope. Patient states that she ate very little food this morning then went to work. Working out in the sun for several hours having only a small cup of water. She took all of her morning medications including her blood pressure medication. Approaching lunchtime she began to have episodes of near syncope. Culminated by an episode of syncope where she fell onto her daughter's arms. When she awoke the family took her to the emergency room for evaluation. Patient said when she started to pass out the third time she had crushing chest pain midsternal.  ED course: Patient given a liter fluid. EKG no STEMI. Mild acute kidney injury on labs. Hospitalist consulted for admission.   Review of Systems:  As per HPI otherwise 10 point review of systems negative.    Past Medical History:  Diagnosis Date  . Arthritis   . Chronic headache   . Complication of anesthesia    allergy to Novocaine   . Diabetes mellitus   . GERD (gastroesophageal reflux disease)   . Heart murmur   . High cholesterol   . Hyperlipidemia   . Hypertension   . Intracranial atherosclerosis    per MRA  . Migraine   . Narcolepsy and cataplexy   . OSA on CPAP    residual AHI of-1.1, CPAPevery night   . Rectal prolapse    Past Surgical History:  Procedure Laterality Date  . ABDOMINAL HYSTERECTOMY    . BACK SURGERY     L4,L5 discectomy  . CARDIAC CATHETERIZATION    . CARPAL TUNNEL RELEASE Right 01/18/2017  . KIDNEY SURGERY  1998   Right.  growth removed  . rectal prolapse repair  12/05/11  . ROTATOR CUFF REPAIR     Left  . SHOULDER ARTHROSCOPY WITH SUBACROMIAL DECOMPRESSION, ROTATOR  CUFF REPAIR AND BICEP TENDON REPAIR Left 05/25/2015   Procedure: SHOULDER ARTHROSCOPY WITH SUBACROMIAL DECOMPRESSION, ROTATOR CUFF REPAIR AND BICEP TENODESIS, DEBRIDEMENT. ;  Surgeon: Meredith Pel, MD;  Location: [facility];  Service: Orthopedics;  Laterality: Left;  LEFT SHOULDER ROTATOR CUFF TEAR REPAIR, ARTHROSCOPY, DEBRIDEMENT, BICEPS TENODESIS, SUBACROMIAL DECOMPRESSION.  . TOTAL ABDOMINAL HYSTERECTOMY    . TUBAL LIGATION     Social History:  reports that she quit smoking about 36 years ago. She has never used smokeless tobacco. She reports that she does not drink alcohol or use drugs.  Allergies  Allergen Reactions  . Morphine And Related Anaphylaxis    Unknown.    Armanda [facility] [Procaine Hcl] Anaphylaxis  . Hydrocodone Diarrhea and Nausea And Vomiting    Reports this no longer happens     Family History  Problem Relation Age of Onset  . Heart disease Mother   . Throat cancer Father   . Cancer Father        stomach  . Kidney cancer Brother   . Heart disease Brother      Prior to Admission medications   Medication Sig Start Date End Date Taking? Authorizing Provider  acetaminophen-codeine (TYLENOL #3) 300-30 MG tablet Take 1 tablet by mouth every 6 (six) hours as needed. for pain 01/18/17   [provider]  amLODipine (NORVASC) 10 MG tablet Take 10 mg by mouth daily after  breakfast.     [provider]  aspirin 81 MG tablet Take 81 mg by mouth at bedtime.     [provider]  celecoxib (CELEBREX) 200 MG capsule Take 200 mg by mouth daily.     [provider]  doxycycline (VIBRA-TABS) 100 MG tablet Take 100 mg by mouth 2 (two) times daily.    [provider]  fish oil-omega-3 fatty acids 1000 MG capsule Take 1 g by mouth daily.      [provider]  gabapentin (NEURONTIN) 400 MG capsule Take 400 mg by mouth at bedtime.     [provider]  losartan (COZAAR) 100 MG tablet Take 100 mg by mouth daily after breakfast.      [provider]  metFORMIN (GLUCOPHAGE) 500 MG tablet Take 500 mg by mouth 2 (two) times daily with a meal.     [provider]  methocarbamol (ROBAXIN) 500 MG tablet Take 1 tablet (500 mg total) by mouth 4 (four) times daily. 05/25/15   Meredith Pel, MD  metoprolol (TOPROL-XL) 50 MG 24 hr tablet Take 50 mg by mouth daily after breakfast.     [provider]  oxyCODONE-acetaminophen (ROXICET) 5-325 MG per tablet Take 1-2 tablets by mouth every 6 (six) hours as needed for severe pain. 05/25/15   Meredith Pel, MD  Polyethyl Glycol-Propyl Glycol (SYSTANE ULTRA) 0.4-0.3 % SOLN Place 1 drop into both eyes daily.      [provider]  polysaccharide iron (NIFEREX) 150 MG CAPS capsule Take 150 mg by mouth daily.     [provider]  ranitidine (ZANTAC) 300 MG capsule Take 300 mg by mouth 2 (two) times daily.     [provider]   Physical Exam: Vitals:   05/06/17 1430 05/06/17 1445 05/06/17 1500 05/06/17 1515  BP: 137/75 121/61 112/63 (!) 123/55  Pulse: 66 69 67 66  Resp: 17 17 20  (!) 22  Temp:      TempSrc:      SpO2: 97% 96% 99% 97%  Weight:      Height:        Wt Readings from Last 3 Encounters:  05/06/17 57.6 kg (127 lb)  01/31/17 57.6 kg (127 lb)  08/02/16 58.1 kg (128 lb)    General:  Appears calm and comfortable; A&Ox3 Eyes:  PERRL, EOMI, normal lids, iris ENT:  grossly normal hearing, lips & tongue Neck:  no LAD, masses or thyromegaly Cardiovascular:  RRR, no m/r/g. No LE edema.  Respiratory:  CTA bilaterally, no w/r/r. Normal respiratory effort. Abdomen:  soft, ntnd Skin:  no rash or induration seen on limited exam Musculoskeletal:  grossly normal tone BUE/BLE Psychiatric:  grossly normal mood and affect, speech fluent and appropriate Neurologic:  CN 2-12 grossly intact, moves all extremities in coordinated fashion.          Labs on Admission:  Basic Metabolic Panel:  Recent Labs Lab 05/06/17 1434  NA  139  K 4.0  CL 111  CO2 18*  GLUCOSE 247*  BUN 18  CREATININE 1.59*  CALCIUM 9.9   Liver Function Tests: No results for input(s): AST, ALT, ALKPHOS, BILITOT, PROT, ALBUMIN in the last 168 hours. No results for input(s): LIPASE, AMYLASE in the last 168 hours. No results for input(s): AMMONIA in the last 168 hours. CBC:  Recent Labs Lab 05/06/17 1434  WBC 6.3  HGB 11.1*  HCT 34.9*  MCV 88.1  PLT 243   Cardiac Enzymes: No results  for input(s): CKTOTAL, CKMB, CKMBINDEX, TROPONINI in the last 168 hours.  BNP (last 3 results) No results for input(s): BNP in the last 8760 hours.  ProBNP (last 3 results) No results for input(s): PROBNP in the last 8760 hours.   Serum creatinine: 1.59 mg/dL (H) 05/06/17 1434 Estimated creatinine clearance: 22.9 mL/min (A)  CBG: No results for input(s): GLUCAP in the last 168 hours.  Radiological Exams on Admission: Dg Chest 2 View  Result Date: 05/06/2017 CLINICAL DATA:  Recent syncopal episode EXAM: CHEST  2 VIEW COMPARISON:  08/02/2016 FINDINGS: The heart size and mediastinal contours are within normal limits. Both lungs are clear. The visualized skeletal structures are unremarkable. IMPRESSION: No active cardiopulmonary disease. Electronically Signed   By: Inez Catalina M.D.   On: 05/06/2017 16:02    EKG: Independently reviewed. NSR.  Assessment/Plan Principal Problem:   AKI (acute kidney injury) ([place]) Active Problems:   Diabetes mellitus ([facility])   GERD (gastroesophageal reflux disease)   Dehydration   Chest pain  AKI Baseline Cr nl, Cr on admit 1.59 1029ml of normal saline given in the emergency room Gentle hydration overnight Checking magnesium and phosphorus Likely due to dehydration  CP - serial trop ordered, initial neg - prn EKG CP - prn ntg cp - asa in ED and QD - ECHO ordered for AM - tele bed, cardiac monitoring - ambien for sleep prn - zofran prn for nausea   Hypertension When necessary hydralazine 10 mg IV  as needed for severe blood pressure Cont norvasc and toprol XL Hold cozaar  Anemia Cont iron po  DM ACHS SSI Hold metformin  GERD Hold PPI  Code Status: full DVT Prophylaxis: lovenox Family Communication: husbad at bedside Disposition Plan: Pending Improvement  Status: obs tele  Elwin Mocha, MD Family Medicine [facility] [email or web address] Password [email or web address]

## 2017-05-06 NOTE — ED Triage Notes (Addendum)
Per [facility]: Pt to ED from church following syncopal episode, lasting 30 seconds. Pt had been outside cooking for church members since 0930 and reports feeling flushed, hot, and lightheaded. She told her husband and family helped her to the ground as she passed out. Upon waking, pt reported non-radiating chest/epigastric dull pressure rated 8/10 which resolved en route. Pt states her pain and lightheadedness went away en route - EMS reports having the [facility] on high for patient as she felt overheated. Patient A&O x 4, no focal symptoms noted, moves all extremities well, but c/o cramps in her fingers and her feet. Pt stated that she had not had anything to eat today and hardly anything to drink. Pt denies CP/SOB/dizziness. Resp e/u, skin warm/dry.

## 2017-05-07 ENCOUNTER — Observation Stay (HOSPITAL_BASED_OUTPATIENT_CLINIC_OR_DEPARTMENT_OTHER): Payer: Medicare Other

## 2017-05-07 DIAGNOSIS — I360 Nonrheumatic tricuspid (valve) stenosis: Secondary | ICD-10-CM | POA: Diagnosis not present

## 2017-05-07 DIAGNOSIS — E860 Dehydration: Secondary | ICD-10-CM

## 2017-05-07 DIAGNOSIS — N179 Acute kidney failure, unspecified: Secondary | ICD-10-CM

## 2017-05-07 DIAGNOSIS — R079 Chest pain, unspecified: Secondary | ICD-10-CM | POA: Diagnosis not present

## 2017-05-07 DIAGNOSIS — R55 Syncope and collapse: Secondary | ICD-10-CM

## 2017-05-07 LAB — GLUCOSE, CAPILLARY
GLUCOSE-CAPILLARY: 202 mg/dL — AB (ref 65–99)
Glucose-Capillary: 94 mg/dL (ref 65–99)

## 2017-05-07 LAB — URINALYSIS, ROUTINE W REFLEX MICROSCOPIC
Bilirubin Urine: NEGATIVE
Glucose, UA: NEGATIVE mg/dL
HGB URINE DIPSTICK: NEGATIVE
KETONES UR: NEGATIVE mg/dL
Leukocytes, UA: NEGATIVE
NITRITE: NEGATIVE
Protein, ur: NEGATIVE mg/dL
Specific Gravity, Urine: 1.009 (ref 1.005–1.030)
pH: 6 (ref 5.0–8.0)

## 2017-05-07 LAB — BASIC METABOLIC PANEL
ANION GAP: 8 (ref 5–15)
BUN: 14 mg/dL (ref 6–20)
CALCIUM: 9.5 mg/dL (ref 8.9–10.3)
CO2: 22 mmol/L (ref 22–32)
CREATININE: 1.06 mg/dL — AB (ref 0.44–1.00)
Chloride: 110 mmol/L (ref 101–111)
GFR calc Af Amer: 57 mL/min — ABNORMAL LOW (ref >60)
GFR calc non Af Amer: 49 mL/min — ABNORMAL LOW (ref >60)
GLUCOSE: 96 mg/dL (ref 65–99)
Potassium: 4 mmol/L (ref 3.5–5.1)
Sodium: 140 mmol/L (ref 135–145)

## 2017-05-07 LAB — ECHOCARDIOGRAM COMPLETE
Height: 59 in
WEIGHTICAEL: 2001.6 [oz_av]

## 2017-05-07 LAB — TROPONIN I: Troponin I: <0.03 ng/mL (ref <0.03)

## 2017-05-07 MED ORDER — FAMOTIDINE 20 MG PO TABS: 20.0000 mg | ORAL_TABLET | Freq: Two times a day (BID) | ORAL | 0 refills | Status: DC

## 2017-05-07 MED ORDER — GI COCKTAIL ~~LOC~~: 30.0000 mL | Freq: Once | ORAL | Status: DC

## 2017-05-07 MED ORDER — FAMOTIDINE 20 MG PO TABS
20.0000 mg | ORAL_TABLET | Freq: Two times a day (BID) | ORAL | Status: DC
  Administered 2017-05-07: 20 mg via ORAL
  Filled 2017-05-07: qty 1

## 2017-05-07 NOTE — Discharge Summary (Signed)
Physician Discharge Summary  Stephanie Barber  ZOX:[id number]  DOB: [birth year]  DOA: 05/06/2017 PCP: [name], MD  Admit date: 05/06/2017 Discharge date: 05/07/2017  Admitted From: Home  Disposition:  Home   Recommendations for Outpatient Follow-up:  1. Follow up with PCP in 1- week 2. Please obtain BMP/CBC in one week to monitor Hgb and renal function   Discharge Condition: Stable CODE STATUS: Full code Diet recommendation: Heart Healthy   Brief/Interim Summary: HPI per Dr Aggie Moats.  Stephanie Barber is a 76 y.o. female  with past medical history significant for diabetes, hyperlipidemia, hypertension who presents with complaint of syncope. Patient states that she ate very little food the day of admission then went to work. Working out in the sun for several hours having only a small cup of water. She took all of her morning medications including her blood pressure medication. Approaching lunchtime she began to have episodes of near syncope. Culminated by an episode of syncope where she fell onto her daughter's arms. When she awoke the family took her to the emergency room for evaluation. Patient said when she started to pass out the third time she had crushing chest pain midsternal. ED course: Patient given a liter fluid. EKG no STEMI. Mild acute kidney injury on labs.   Patient was placed on observation and was monitored on telemetry w/o any significant events. EKG no acute changes, ECHO unremarkable with normal troponin. Patient had pain relieve with Pepcid. Patient clinically improved and will be discharge home to follow up with PCP   Subjective: Patient seen and examined, report feeling much improved, epigastric/chest pain have resolved. No acute events on telemetry. Afebrile. Tolerating diet well and ambulating with out issues   Discharge Diagnoses/Hospital Course:  Vasovagal syncope 2/2 dehydration  Syncope workup negative, TNI negative, EKG with no changes, no events on  telemetry, ECHO only with G1DD. Orthostatic vital wnl, no carotid bruit noted Encourage oral hydration  Follow up with PCP   AKI - poor oral intake  Treated with IVF with good response  Cr Upon discharge 1.06   Atypical chest pain 2/2 GERDs No signs of ischemia on EKG, cardiac enzymes negative and ECHO with G1DD Defer to PCP if further cardiac workup is deemed to be necessary  Improved with Pepcid - patient discharge on Pepcid 20 mg BID   HTN  Stable  No changes in medications were made  Follow up with PCP   All other chronic medical condition were stable during the hospitalization.  On the day of the discharge the patient's vitals were stable, and no other acute medical condition were reported by patient. Patient was felt safe to be discharge to home   Discharge Instructions  You were cared for by a hospitalist during your hospital stay. If you have any questions about your discharge medications or the care you received while you were in the hospital after you are discharged, you can call the unit and asked to speak with the hospitalist on call if the hospitalist that took care of you is not available. Once you are discharged, your primary care physician will handle any further medical issues. Please note that NO REFILLS for any discharge medications will be authorized once you are discharged, as it is imperative that you return to your primary care physician (or establish a relationship with a primary care physician if you do not have one) for your aftercare needs so that they can reassess your need for medications and monitor your lab values.  Discharge Instructions    Call MD for:  difficulty breathing, headache or visual disturbances    Complete by:  As directed    Call MD for:  extreme fatigue    Complete by:  As directed    Call MD for:  hives    Complete by:  As directed    Call MD for:  persistant dizziness or light-headedness    Complete by:  As directed    Call MD for:   persistant nausea and vomiting    Complete by:  As directed    Call MD for:  redness, tenderness, or signs of infection (pain, swelling, redness, odor or green/yellow discharge around incision site)    Complete by:  As directed    Call MD for:  severe uncontrolled pain    Complete by:  As directed    Call MD for:  temperature >100.4    Complete by:  As directed    Diet - low sodium heart healthy    Complete by:  As directed    Increase activity slowly    Complete by:  As directed      Allergies as of 05/07/2017      Reactions   Morphine And Related Anaphylaxis   Novocain [procaine Hcl] Anaphylaxis      Medication List    STOP taking these medications   celecoxib 200 MG capsule Commonly known as:  CELEBREX   methocarbamol 500 MG tablet Commonly known as:  ROBAXIN   oxyCODONE-acetaminophen 5-325 MG tablet Commonly known as:  ROXICET   ranitidine 300 MG capsule Commonly known as:  ZANTAC     TAKE these medications   acetaminophen-codeine 300-30 MG tablet Commonly known as:  TYLENOL #3 Take 1 tablet by mouth every 6 (six) hours as needed (pain).   amLODipine 10 MG tablet Commonly known as:  NORVASC Take 10 mg by mouth daily after breakfast.   aspirin EC 81 MG tablet Take 81 mg by mouth at bedtime.   famotidine 20 MG tablet Commonly known as:  PEPCID Take 1 tablet (20 mg total) by mouth 2 (two) times daily.   fish oil-omega-3 fatty acids 1000 MG capsule Take 1 g by mouth daily.   gabapentin 400 MG capsule Commonly known as:  NEURONTIN Take 800 mg by mouth at bedtime.   losartan 100 MG tablet Commonly known as:  COZAAR Take 100 mg by mouth daily after breakfast.   metFORMIN 500 MG tablet Commonly known as:  GLUCOPHAGE Take 500 mg by mouth 2 (two) times daily with a meal.   metoprolol succinate 50 MG 24 hr tablet Commonly known as:  TOPROL-XL Take 50 mg by mouth daily after breakfast.   polysaccharide iron 150 MG capsule Generic drug:  iron  polysaccharides Take 150 mg by mouth daily.   pravastatin 40 MG tablet Commonly known as:  PRAVACHOL Take 40 mg by mouth at bedtime.   SYSTANE ULTRA 0.4-0.3 % Soln Generic drug:  Polyethyl Glycol-Propyl Glycol Place 1 drop into both eyes daily.      Follow-up Information    [name], MD. Schedule an appointment as soon as possible for a visit in 1 week(s).   Specialty:  Family Medicine Why:  Hospital follow up  Contact information: [place] [address] [phone]          Allergies  Allergen Reactions  . Morphine And Related Anaphylaxis  . Novocain [Procaine Hcl] Anaphylaxis    Consultations:  None  Procedures/Studies: Dg Chest 2 View  Result Date: 05/06/2017 CLINICAL DATA:  Recent syncopal episode EXAM: CHEST  2 VIEW COMPARISON:  08/02/2016 FINDINGS: The heart size and mediastinal contours are within normal limits. Both lungs are clear. The visualized skeletal structures are unremarkable. IMPRESSION: No active cardiopulmonary disease. Electronically Signed   By: Inez Catalina M.D.   On: 05/06/2017 16:02    ECHO 05/07/2017 ------------------------------------------------------------------- Study Conclusions  - Left ventricle: The cavity size was normal. Wall thickness was   normal. Systolic function was normal. The estimated ejection   fraction was in the range of 60% to 65%. Doppler parameters are   consistent with abnormal left ventricular relaxation (grade 1   diastolic dysfunction). - Aortic valve: Calcified non coronary cusp. There was trivial   regurgitation. - Mitral valve: Nodular calcification of the anterior leaflet.   Calcified annulus. - Atrial septum: A patent foramen ovale cannot be excluded. - Pulmonary arteries: PA peak pressure: 42 mm Hg (S).   Discharge Exam: Vitals:   05/07/17 0512 05/07/17 1018  BP: (!) 127/53 (!) 132/54  Pulse: 67 64  Resp: 20   Temp: 98 F (36.7 C)    Vitals:   05/06/17  2220 05/07/17 0157 05/07/17 0512 05/07/17 1018  BP: (!) 135/58 (!) 123/49 (!) 127/53 (!) 132/54  Pulse: 60 61 67 64  Resp: 16 20 20    Temp: 98 F (36.7 C) 98.4 F (36.9 C) 98 F (36.7 C)   TempSrc:   Oral   SpO2: 98% 99% 100%   Weight:      Height:        General: Pt is alert, awake, not in acute distress Cardiovascular: RRR, S1/S2 +, no rubs, no gallops Respiratory: CTA bilaterally, no wheezing, no rhonchi Abdominal: Soft, NT, ND, bowel sounds + Extremities: no edema, no cyanosis   The results of significant diagnostics from this hospitalization (including imaging, microbiology, ancillary and laboratory) are listed below for reference.     Microbiology: No results found for this or any previous visit (from the past 240 hour(s)).   Labs: BNP (last 3 results) No results for input(s): BNP in the last 8760 hours. Basic Metabolic Panel:  Recent Labs Lab 05/06/17 1434 05/06/17 1825 05/07/17 0916  NA 139  --  140  K 4.0  --  4.0  CL 111  --  110  CO2 18*  --  22  GLUCOSE 247*  --  96  BUN 18  --  14  CREATININE 1.59*  --  1.06*  CALCIUM 9.9  --  9.5  MG  --  1.7  --   PHOS  --  3.4  --    Liver Function Tests: No results for input(s): AST, ALT, ALKPHOS, BILITOT, PROT, ALBUMIN in the last 168 hours. No results for input(s): LIPASE, AMYLASE in the last 168 hours. No results for input(s): AMMONIA in the last 168 hours. CBC:  Recent Labs Lab 05/06/17 1434  WBC 6.3  HGB 11.1*  HCT 34.9*  MCV 88.1  PLT 243   Cardiac Enzymes:  Recent Labs Lab 05/06/17 1825 05/06/17 2108 05/07/17 0026  TROPONINI <0.03 <0.03 <0.03   BNP: Invalid input(s): POCBNP CBG:  Recent Labs Lab 05/06/17 1834 05/06/17 2222 05/07/17 0747 05/07/17 1205  GLUCAP 88 110* 94 202*   D-Dimer No results for input(s): DDIMER in the last 72 hours. Hgb A1c No results for input(s): HGBA1C in the last 72 hours. Lipid Profile No results for input(s): CHOL, HDL, LDLCALC, TRIG, CHOLHDL,  LDLDIRECT in the last 72 hours. Thyroid function studies No results for input(s): TSH, T4TOTAL, T3FREE, THYROIDAB in the last 72 hours.  Invalid input(s): FREET3 Anemia work up No results for input(s): VITAMINB12, FOLATE, FERRITIN, TIBC, IRON, RETICCTPCT in the last 72 hours. Urinalysis    Component Value Date/Time   COLORURINE STRAW (A) 05/07/2017 0109   APPEARANCEUR CLEAR 05/07/2017 0109   LABSPEC 1.009 05/07/2017 0109   PHURINE 6.0 05/07/2017 0109   GLUCOSEU NEGATIVE 05/07/2017 0109   HGBUR NEGATIVE 05/07/2017 0109   BILIRUBINUR NEGATIVE 05/07/2017 0109   KETONESUR NEGATIVE 05/07/2017 0109   PROTEINUR NEGATIVE 05/07/2017 0109   UROBILINOGEN 0.2 05/20/2009 1405   NITRITE NEGATIVE 05/07/2017 0109   LEUKOCYTESUR NEGATIVE 05/07/2017 0109   Sepsis Labs Invalid input(s): PROCALCITONIN,  WBC,  LACTICIDVEN Microbiology No results found for this or any previous visit (from the past 240 hour(s)).   Time coordinating discharge: Over 30 minutes  SIGNED:  Chipper Oman, MD  [facility] 05/07/2017, 1:41 PM  Pager please text page via  [email or web address] Password [email or web address]

## 2017-05-07 NOTE — Progress Notes (Signed)
NURSING PROGRESS NOTE  Stephanie Barber [id number] Discharge Data: 05/07/2017 2:39 PM Attending Provider: Patrecia Pour, Christean Grief, MD OZD:GUYQI, [name], MD     Stephanie Barber to be D/C'd Home per MD order.  Discussed with the patient the After Visit Summary and all questions fully answered. All IV's discontinued with no bleeding noted. All belongings returned to patient for patient to take home.   Last Vital Signs:  Blood pressure 124/61, pulse 62, temperature 98 F (36.7 C), resp. rate 16, height 4\' 11"  (1.499 m), weight 56.7 kg (125 lb 1.6 oz), SpO2 99 %.  Discharge Medication List Allergies as of 05/07/2017      Reactions   Morphine And Related Anaphylaxis   Novocain [procaine Hcl] Anaphylaxis      Medication List    STOP taking these medications   celecoxib 200 MG capsule Commonly known as:  CELEBREX   methocarbamol 500 MG tablet Commonly known as:  ROBAXIN   oxyCODONE-acetaminophen 5-325 MG tablet Commonly known as:  ROXICET   ranitidine 300 MG capsule Commonly known as:  ZANTAC     TAKE these medications   acetaminophen-codeine 300-30 MG tablet Commonly known as:  TYLENOL #3 Take 1 tablet by mouth every 6 (six) hours as needed (pain).   amLODipine 10 MG tablet Commonly known as:  NORVASC Take 10 mg by mouth daily after breakfast.   aspirin EC 81 MG tablet Take 81 mg by mouth at bedtime.   famotidine 20 MG tablet Commonly known as:  PEPCID Take 1 tablet (20 mg total) by mouth 2 (two) times daily.   fish oil-omega-3 fatty acids 1000 MG capsule Take 1 g by mouth daily.   gabapentin 400 MG capsule Commonly known as:  NEURONTIN Take 800 mg by mouth at bedtime.   losartan 100 MG tablet Commonly known as:  COZAAR Take 100 mg by mouth daily after breakfast.   metFORMIN 500 MG tablet Commonly known as:  GLUCOPHAGE Take 500 mg by mouth 2 (two) times daily with a meal.   metoprolol succinate 50 MG 24 hr tablet Commonly known as:  TOPROL-XL Take 50 mg by  mouth daily after breakfast.   polysaccharide iron 150 MG capsule Generic drug:  iron polysaccharides Take 150 mg by mouth daily.   pravastatin 40 MG tablet Commonly known as:  PRAVACHOL Take 40 mg by mouth at bedtime.   SYSTANE ULTRA 0.4-0.3 % Soln Generic drug:  Polyethyl Glycol-Propyl Glycol Place 1 drop into both eyes daily.

## 2017-05-07 NOTE — Progress Notes (Signed)
  Echocardiogram 2D Echocardiogram has been performed.  Darlina Sicilian M 05/07/2017, 10:42 AM

## 2017-05-08 ENCOUNTER — Ambulatory Visit: Payer: Medicare Other | Admitting: Occupational Therapy

## 2017-05-08 DIAGNOSIS — M25641 Stiffness of right hand, not elsewhere classified: Secondary | ICD-10-CM | POA: Diagnosis not present

## 2017-05-08 DIAGNOSIS — R278 Other lack of coordination: Secondary | ICD-10-CM | POA: Diagnosis not present

## 2017-05-08 DIAGNOSIS — M25541 Pain in joints of right hand: Secondary | ICD-10-CM | POA: Diagnosis not present

## 2017-05-08 DIAGNOSIS — M25531 Pain in right wrist: Secondary | ICD-10-CM

## 2017-05-08 DIAGNOSIS — M25631 Stiffness of right wrist, not elsewhere classified: Secondary | ICD-10-CM

## 2017-05-08 DIAGNOSIS — R208 Other disturbances of skin sensation: Secondary | ICD-10-CM | POA: Diagnosis not present

## 2017-05-08 NOTE — Patient Instructions (Signed)
Your Splint This splint should initially be fitted by a healthcare practitioner.  The healthcare practitioner is responsible for providing wearing instructions and precautions to the patient, other healthcare practitioners and care provider involved in the patient's care.  This splint was custom made for you. Please read the following instructions to learn about wearing and caring for your splint.  Precautions Should your splint cause any of the following problems, remove the splint immediately and contact your therapist/physician.  Swelling  Severe Pain  Pressure Areas  Stiffness  Numbness  Do not wear your splint while operating machinery unless it has been fabricated for that purpose.  When To Wear Your Splint Where your splint according to your therapist/physician instructions. Daytime during aggravating activites  Care and Cleaning of Your Splint 1. Keep your splint away from open flames. 2. Your splint will lose its shape in temperatures over 135 degrees Farenheit, ( in car windows, near radiators, ovens or in hot water).  Never make any adjustments to your splint, if the splint needs adjusting remove it and make an appointment to see your therapist. 3. Your splint may be cleaned with rubbing alcohol 1-2x/day.  Do not immerse in hot water over 135 degrees Farenheit. 4. Straps may be washed with soap and water, but do not moisten the self-adhesive portion. 5. For ink or hard to remove spots use a scouring cleanser which contains chlorine.  Rinse the splint thoroughly after using chlorine cleanser.

## 2017-05-08 NOTE — Therapy (Signed)
[facility] [address] [place], [place], [address] Phone: [phone]   Fax:  [phone]  Occupational Therapy Treatment  Patient Details  Name: Stephanie Barber MRN: [id number] Date of Birth: 10/10/1941 Referring Provider: Dr Christella Noa  Encounter Date: 05/08/2017      OT End of Session - 05/08/17 1210    Visit Number 10   Number of Visits 16   Date for OT Re-Evaluation 05/20/17   Authorization Type [facility] trad- primary   Authorization Time Period gcode and pn every 10th visit   Authorization - Visit Number 10   Authorization - Number of Visits 10   OT Start Time 1105   OT Stop Time 1150   OT Time Calculation (min) 45 min   Equipment Utilized During Treatment fluidotherapy   Activity Tolerance Patient tolerated treatment well      Past Medical History:  Diagnosis Date  . Arthritis   . Chronic headache   . Complication of anesthesia    allergy to Novocaine   . Diabetes mellitus   . GERD (gastroesophageal reflux disease)   . Heart murmur   . High cholesterol   . Hyperlipidemia   . Hypertension   . Intracranial atherosclerosis    per MRA  . Migraine   . Narcolepsy and cataplexy   . OSA on CPAP    residual AHI of-1.1, CPAPevery night   . Rectal prolapse     Past Surgical History:  Procedure Laterality Date  . ABDOMINAL HYSTERECTOMY    . BACK SURGERY     L4,L5 discectomy  . CARDIAC CATHETERIZATION    . CARPAL TUNNEL RELEASE Right 01/18/2017  . KIDNEY SURGERY  1998   Right.  growth removed  . rectal prolapse repair  12/05/11  . ROTATOR CUFF REPAIR     Left  . SHOULDER ARTHROSCOPY WITH SUBACROMIAL DECOMPRESSION, ROTATOR CUFF REPAIR AND BICEP TENDON REPAIR Left 05/25/2015   Procedure: SHOULDER ARTHROSCOPY WITH SUBACROMIAL DECOMPRESSION, ROTATOR CUFF REPAIR AND BICEP TENODESIS, DEBRIDEMENT. ;  Surgeon: Meredith Pel, MD;  Location: [facility];  Service: Orthopedics;  Laterality: Left;  LEFT SHOULDER  ROTATOR CUFF TEAR REPAIR, ARTHROSCOPY, DEBRIDEMENT, BICEPS TENODESIS, SUBACROMIAL DECOMPRESSION.  . TOTAL ABDOMINAL HYSTERECTOMY    . TUBAL LIGATION      There were no vitals filed for this visit.      Subjective Assessment - 05/08/17 1119    Subjective  I've only had 1 injection. I won't get another for at least 2-3 weeks.    Pertinent History CTR 01/18/17   Currently in Pain? Yes   Pain Score 4    Pain Location Wrist   Pain Orientation Right   Pain Descriptors / Indicators Aching   Pain Type Chronic pain   Pain Frequency Constant   Aggravating Factors  worse in am   Pain Relieving Factors heat, massage                      OT Treatments/Exercises (OP) - 05/08/17 0001      ADLs   ADL Comments Pt reports a catching at times at Rt long PIP joint, but can feel it around A1 pulley - suspect trigger finger. Pt reports that previously issued soft finger splint (for MP joint) did not really help with symptoms, and still reports "catching", therefore fabricated splint - see below for details     Ultrasound   Ultrasound Location wrist over incision   Ultrasound Parameters 3 Mhz, 20% pulsed, 0.8  wts/cm2 x 8 min   Ultrasound Goals Pain  scar management/ break up tissue, incr. circulation     RUE Fluidotherapy   Number Minutes Fluidotherapy 10 Minutes   RUE Fluidotherapy Location Hand;Wrist   Comments at beginning of session for pain management     Splinting   Splinting Fabricated and fitted orthoplast splint to block MP flexion Rt long finger to prevent triggering                OT Education - 05/26/2017 1138    Education provided Yes   Education Details splint wear and care   Person(s) Educated Patient   Methods Explanation;Demonstration;Handout   Comprehension Verbalized understanding;Returned demonstration          OT Short Term Goals - 04/24/17 1236      OT SHORT TERM GOAL #1   Title Patient will complete home exercise program designed to improve  coordination right hand DUE 04/20/17   Status Achieved     OT SHORT TERM GOAL #2   Title Patient will demonstrate improved active range of motion in right wrist extension to 45 degrees to aide with simple cooking tasks   Status Achieved     OT SHORT TERM GOAL #3   Title Patient will demonstrate improved grasp strength to 30 lbs to improve her ability to open screw top bottles   Status Achieved           OT Long Term Goals - 04/24/17 1236      OT LONG TERM GOAL #1   Title Patient will complete HEP designed to improve hand strength due 05/20/17   Status Achieved     OT LONG TERM GOAL #2   Title Patient will demonstrate improved active wrist extension to 60 degrees to aide  with IADL- cooking, housekeeping   Status On-going     OT LONG TERM GOAL #3   Title Patient will legibly write at paragraph level without pain or fatigue   Status Achieved     OT LONG TERM GOAL #4   Title Patient will demonstrate improved coordination on 9hole peg test by 3 seconds right hand   Status Achieved     OT LONG TERM GOAL #5   Title Patient will report no greater pain at rest (in right wrist / hand) than 3/10   Status On-going               Plan - 05-26-2017 1215    Clinical Impression Statement Pt reports new splint feels comfortable and seems like it would help with triggering. Pt has tolerable pain today which decr. after therapy   Rehab Potential Good   OT Frequency 2x / week   OT Duration 8 weeks   OT Treatment/Interventions Self-care/ADL training;Cryotherapy;Electrical Stimulation;Moist Heat;Fluidtherapy;Parrafin;Ultrasound;Therapeutic exercise;Neuromuscular education;DME and/or AE instruction;Passive range of motion;Scar mobilization;Manual Therapy;Splinting;Therapeutic activities;Patient/family education   Plan fluidotherapy, scar management, wrist mechanics, coordination RUE, assess splint      Patient will benefit from skilled therapeutic intervention in order to improve the  following deficits and impairments:  Decreased coordination, Decreased range of motion, Decreased skin integrity, Decreased scar mobility, Decreased strength, Increased edema, Impaired UE functional use, Pain, Impaired sensation  Visit Diagnosis: Stiffness of right hand, not elsewhere classified  Stiffness of right wrist, not elsewhere classified  Pain in joint of right hand  Pain in right wrist      G-Codes - 2017-05-26 1212    Functional Assessment Tool Used (Outpatient only) skilled clinical judgement, 9 hole peg  test   Functional Limitation Carrying, moving and handling objects   Carrying, Moving and Handling Objects Current Status [phone]) At least 40 percent but less than 60 percent impaired, limited or restricted   Carrying, Moving and Handling Objects Goal Status (J0300) At least 20 percent but less than 40 percent impaired, limited or restricted      Problem List Patient Active Problem List   Diagnosis Date Noted  . AKI (acute kidney injury) ([place]) 05/06/2017  . Dehydration 05/06/2017  . Chest pain 05/06/2017  . Wound infection after surgery 01/30/2017  . Palpitations 08/24/2016  . Noncompliance with CPAP treatment 12/28/2015  . Poor compliance with CPAP treatment 10/26/2014  . CPAP use counseling 07/28/2014  . OSA on CPAP 07/28/2014  . 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 Hypersomnolent 04/02/2014  . Obstructive sleep apnea 04/02/2014  . Non-compliance with treatment 03/05/2014  . Diabetes mellitus ([place]) 12/05/2011  . Hypertension 12/05/2011  . GERD (gastroesophageal reflux disease)   . 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 Chronic cough 07/26/2011    Occupational Therapy Progress Note  Dates of Reporting Period: 03/21/17 to 05/08/17  Objective Reports of Subjective Statement: My pain is overall better 4/10. I have a "catching" of my Rt long finger  Objective Measurements: 4/10 pain.   Goal Update: see above  Plan: see above  Reason Skilled  Services are Required: pain management, scar management, increase coordination, strength, task modifications and A/E prn   Carey Bullocks, OTR/L 05/08/2017, 12:18 PM  [facility] [address] [place], [place], [address] Phone: [phone]   Fax:  [phone]  Name: Stephanie Barber MRN: [id number] Date of Birth: 1941-09-16

## 2017-05-10 ENCOUNTER — Ambulatory Visit: Payer: Medicare Other | Admitting: Occupational Therapy

## 2017-05-10 DIAGNOSIS — R278 Other lack of coordination: Secondary | ICD-10-CM | POA: Diagnosis not present

## 2017-05-10 DIAGNOSIS — M25531 Pain in right wrist: Secondary | ICD-10-CM

## 2017-05-10 DIAGNOSIS — M25641 Stiffness of right hand, not elsewhere classified: Secondary | ICD-10-CM

## 2017-05-10 DIAGNOSIS — M25631 Stiffness of right wrist, not elsewhere classified: Secondary | ICD-10-CM

## 2017-05-10 DIAGNOSIS — R208 Other disturbances of skin sensation: Secondary | ICD-10-CM | POA: Diagnosis not present

## 2017-05-10 DIAGNOSIS — M25541 Pain in joints of right hand: Secondary | ICD-10-CM | POA: Diagnosis not present

## 2017-05-10 DIAGNOSIS — E860 Dehydration: Secondary | ICD-10-CM | POA: Diagnosis not present

## 2017-05-10 DIAGNOSIS — M6281 Muscle weakness (generalized): Secondary | ICD-10-CM

## 2017-05-10 NOTE — Therapy (Signed)
[facility] [address] [place], [place], [address] Phone: [phone]   Fax:  [phone]  Occupational Therapy Treatment  Patient Details  Name: Stephanie Barber MRN: [id number] Date of Birth: 17-Sep-1941 Referring Provider: Dr Christella Noa  Encounter Date: 05/10/2017      OT End of Session - 05/10/17 1416    Visit Number 11   Number of Visits 16   Date for OT Re-Evaluation 05/20/17   Authorization Type mcare trad- primary   Authorization Time Period gcode and pn every 10th visit   Authorization - Visit Number 11   Authorization - Number of Visits 20   OT Start Time 1409   OT Stop Time 1447   OT Time Calculation (min) 38 min   Activity Tolerance Patient tolerated treatment well   Behavior During Therapy [facility] for tasks assessed/performed      Past Medical History:  Diagnosis Date  . Arthritis   . Chronic headache   . Complication of anesthesia    allergy to Novocaine   . Diabetes mellitus   . GERD (gastroesophageal reflux disease)   . Heart murmur   . High cholesterol   . Hyperlipidemia   . Hypertension   . Intracranial atherosclerosis    per MRA  . Migraine   . Narcolepsy and cataplexy   . OSA on CPAP    residual AHI of-1.1, CPAPevery night   . Rectal prolapse     Past Surgical History:  Procedure Laterality Date  . ABDOMINAL HYSTERECTOMY    . BACK SURGERY     L4,L5 discectomy  . CARDIAC CATHETERIZATION    . CARPAL TUNNEL RELEASE Right 01/18/2017  . KIDNEY SURGERY  1998   Right.  growth removed  . rectal prolapse repair  12/05/11  . ROTATOR CUFF REPAIR     Left  . SHOULDER ARTHROSCOPY WITH SUBACROMIAL DECOMPRESSION, ROTATOR CUFF REPAIR AND BICEP TENDON REPAIR Left 05/25/2015   Procedure: SHOULDER ARTHROSCOPY WITH SUBACROMIAL DECOMPRESSION, ROTATOR CUFF REPAIR AND BICEP TENODESIS, DEBRIDEMENT. ;  Surgeon: Meredith Pel, MD;  Location: [facility];  Service: Orthopedics;  Laterality: Left;  LEFT  SHOULDER ROTATOR CUFF TEAR REPAIR, ARTHROSCOPY, DEBRIDEMENT, BICEPS TENODESIS, SUBACROMIAL DECOMPRESSION.  . TOTAL ABDOMINAL HYSTERECTOMY    . TUBAL LIGATION      There were no vitals filed for this visit.      Subjective Assessment - 05/10/17 1415    Subjective  I feel better after the shot.   Pertinent History CTR 01/18/17   Currently in Pain? Yes   Pain Score 2    Pain Location --  wrist   Pain Orientation Right   Pain Descriptors / Indicators Aching   Pain Type Chronic pain   Pain Onset More than a month ago   Pain Frequency Constant   Aggravating Factors  worse with incr activity   Pain Relieving Factors heat, massage         Ultrasound   Ultrasound Location wrist over incision   Ultrasound Parameters 3 Mhz, 20% pulsed, 0.8 wts/cm2 x 8 min   Ultrasound Goals Pain  scar management/ break up tissue, incr. circulation     RUE Fluidotherapy   Number Minutes Fluidotherapy 10 Minutes   RUE Fluidotherapy Location Hand;Wrist   Comments at beginning of session for pain management     Manual:  Cross friction massage/soft tissue mobs to volar wrist followed by PROM stretch in wrist ext.  Exercise:  Tendon glides, Isolated MP flex, and AROM wrist ext  with min v.c.                        OT Short Term Goals - 04/24/17 1236      OT SHORT TERM GOAL #1   Title Patient will complete home exercise program designed to improve coordination right hand DUE 04/20/17   Status Achieved     OT SHORT TERM GOAL #2   Title Patient will demonstrate improved active range of motion in right wrist extension to 45 degrees to aide with simple cooking tasks   Status Achieved     OT SHORT TERM GOAL #3   Title Patient will demonstrate improved grasp strength to 30 lbs to improve her ability to open screw top bottles   Status Achieved           OT Long Term Goals - 04/24/17 [place] #1   Title Patient will complete HEP designed to improve hand  strength due 05/20/17   Status Achieved     OT LONG TERM GOAL #2   Title Patient will demonstrate improved active wrist extension to 60 degrees to aide  with IADL- cooking, housekeeping   Status On-going     OT LONG TERM GOAL #3   Title Patient will legibly write at paragraph level without pain or fatigue   Status Achieved     OT LONG TERM GOAL #4   Title Patient will demonstrate improved coordination on 9hole peg test by 3 seconds right hand   Status Achieved     OT LONG TERM GOAL #5   Title Patient will report no greater pain at rest (in right wrist / hand) than 3/10   Status On-going               Plan - 05/10/17 1416    Clinical Impression Statement Pt reports pain has improved and that splint has helped with triggering (no problems with new splint).   Rehab Potential Good   OT Frequency 2x / week   OT Duration 8 weeks   OT Treatment/Interventions Self-care/ADL training;Cryotherapy;Electrical Stimulation;Moist Heat;Fluidtherapy;Parrafin;Ultrasound;Therapeutic exercise;Neuromuscular education;DME and/or AE instruction;Passive range of motion;Scar mobilization;Manual Therapy;Splinting;Therapeutic activities;Patient/family education   Plan begin checking LTGs, continue with fludiotherapy, scar management, wrist mechanics, coordination RUE   OT Home Exercise Plan coordination right UE, strength right hand, stretching wrist- holding off on hand strengthening at this time   Consulted and Agree with Plan of Care Patient      Patient will benefit from skilled therapeutic intervention in order to improve the following deficits and impairments:  Decreased coordination, Decreased range of motion, Decreased skin integrity, Decreased scar mobility, Decreased strength, Increased edema, Impaired UE functional use, Pain, Impaired sensation  Visit Diagnosis: Pain in right wrist  Pain in joint of right hand  Stiffness of right wrist, not elsewhere classified  Stiffness of right hand,  not elsewhere classified  Other lack of coordination  Other disturbances of skin sensation  Muscle weakness (generalized)    Problem List Patient Active Problem List   Diagnosis Date Noted  . AKI (acute kidney injury) ([place]) 05/06/2017  . Dehydration 05/06/2017  . Chest pain 05/06/2017  . Wound infection after surgery 01/30/2017  . Palpitations 08/24/2016  . Noncompliance with CPAP treatment 12/28/2015  . Poor compliance with CPAP treatment 10/26/2014  . CPAP use counseling 07/28/2014  . OSA on CPAP 07/28/2014  . 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 Hypersomnolent 04/02/2014  .  Obstructive sleep apnea 04/02/2014  . Non-compliance with treatment 03/05/2014  . Diabetes mellitus ([place]) 12/05/2011  . Hypertension 12/05/2011  . GERD (gastroesophageal reflux disease)   . 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 Chronic cough 07/26/2011    [facility] 05/10/2017, 2:18 PM  [facility] [address] [place], [place], [address] Phone: [phone]   Fax:  [phone]  Name: Stephanie Barber MRN: [id number] Date of Birth: 03/09/1941   Vianne Bulls, OTR/L [facility] [address] [place], [place]  [address] [phone] phone [phone] 05/10/17 2:51 PM

## 2017-05-15 ENCOUNTER — Ambulatory Visit: Payer: Medicare Other | Attending: Family Medicine | Admitting: Occupational Therapy

## 2017-05-15 DIAGNOSIS — M25531 Pain in right wrist: Secondary | ICD-10-CM | POA: Diagnosis not present

## 2017-05-15 DIAGNOSIS — M25641 Stiffness of right hand, not elsewhere classified: Secondary | ICD-10-CM | POA: Diagnosis not present

## 2017-05-15 DIAGNOSIS — R208 Other disturbances of skin sensation: Secondary | ICD-10-CM | POA: Diagnosis not present

## 2017-05-15 DIAGNOSIS — M6281 Muscle weakness (generalized): Secondary | ICD-10-CM

## 2017-05-15 DIAGNOSIS — M25631 Stiffness of right wrist, not elsewhere classified: Secondary | ICD-10-CM

## 2017-05-15 DIAGNOSIS — M25541 Pain in joints of right hand: Secondary | ICD-10-CM | POA: Diagnosis not present

## 2017-05-15 DIAGNOSIS — R278 Other lack of coordination: Secondary | ICD-10-CM

## 2017-05-15 NOTE — Therapy (Signed)
[facility] [address] [place], [place], [address] Phone: [phone]   Fax:  [phone]  Occupational Therapy Treatment  Patient Details  Name: Stephanie Barber MRN: [id number] Date of Birth: [birth year] Referring Provider: Dr Franky Macho  Encounter Date: 05/15/2017      OT End of Session - 05/15/17 1427    Visit Number 12   Number of Visits 16   Authorization Type mcare trad- primary   Authorization Time Period gcode and pn every 10th visit   Authorization - Visit Number 12   Authorization - Number of Visits 20   OT Start Time 1315   OT Stop Time 1402   OT Time Calculation (min) 47 min   Equipment Utilized During Treatment fluidotherapy, ultrasound   Activity Tolerance Patient tolerated treatment well   Behavior During Therapy [facility] for tasks assessed/performed      Past Medical History:  Diagnosis Date  . Arthritis   . Chronic headache   . Complication of anesthesia    allergy to Novocaine   . Diabetes mellitus   . GERD (gastroesophageal reflux disease)   . Heart murmur   . High cholesterol   . Hyperlipidemia   . Hypertension   . Intracranial atherosclerosis    per MRA  . Migraine   . Narcolepsy and cataplexy   . OSA on CPAP    residual AHI of-1.1, CPAPevery night   . Rectal prolapse     Past Surgical History:  Procedure Laterality Date  . ABDOMINAL HYSTERECTOMY    . BACK SURGERY     L4,L5 discectomy  . CARDIAC CATHETERIZATION    . CARPAL TUNNEL RELEASE Right 01/18/2017  . KIDNEY SURGERY  1998   Right.  growth removed  . rectal prolapse repair  12/05/11  . ROTATOR CUFF REPAIR     Left  . SHOULDER ARTHROSCOPY WITH SUBACROMIAL DECOMPRESSION, ROTATOR CUFF REPAIR AND BICEP TENDON REPAIR Left 05/25/2015   Procedure: SHOULDER ARTHROSCOPY WITH SUBACROMIAL DECOMPRESSION, ROTATOR CUFF REPAIR AND BICEP TENODESIS, DEBRIDEMENT. ;  Surgeon: Cammy Copa, MD;  Location: [facility];  Service: Orthopedics;   Laterality: Left;  LEFT SHOULDER ROTATOR CUFF TEAR REPAIR, ARTHROSCOPY, DEBRIDEMENT, BICEPS TENODESIS, SUBACROMIAL DECOMPRESSION.  . TOTAL ABDOMINAL HYSTERECTOMY    . TUBAL LIGATION      There were no vitals filed for this visit.      Subjective Assessment - 05/15/17 1338    Subjective  It's getting better.     Pertinent History CTR 01/18/17   Currently in Pain? 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  Patient with significant improvement with pain  reduction, and both active and passive wrist range of motion.  We have been limited with strength training, as this leads to increased pain.  Patient aware of need to reduce or eliminate repetitive or power actions of right wrist/ hand until pain subsides.  Patient may benefit from additional injections, and/or further medical intervention to enhance functional use of right hand.       Ultrasound   Ultrasound Location volar wrist/palm   Ultrasound Parameters 3Mhz, continuous, 0.8w/cm2, x 10 min   Ultrasound Goals Pain     RUE Fluidotherapy   Number Minutes Fluidotherapy 10 Minutes   RUE Fluidotherapy Location Hand;Wrist   Comments at beginning of session for pain management     Manual Therapy   Manual Therapy Edema management;Joint mobilization   Manual therapy comments Deep pressure with cross friction for scar  management                OT Education - 05/15/17 1424    Education provided Yes   Education Details progress toward long term goals, plan for holding additional OT until follow up with surgeon regarding any further medical interventions   Methods Explanation   Comprehension Verbalized understanding          OT Short Term Goals - 04/24/17 1236      OT SHORT TERM GOAL #1   Title Patient will complete home exercise program designed to improve coordination right hand DUE 04/20/17   Status Achieved     OT SHORT TERM GOAL #2   Title Patient will demonstrate improved active range of motion in right wrist extension to 45 degrees to aide with simple cooking tasks   Status Achieved     OT SHORT TERM GOAL #3   Title Patient will demonstrate improved grasp strength to 30 lbs to improve her ability to open screw top bottles   Status Achieved           OT Long Term Goals - 05/15/17 1429      OT LONG TERM GOAL #1   Title Patient will complete HEP designed to improve hand strength due 05/20/17   Status Achieved     OT LONG TERM GOAL #2   Title Patient will demonstrate improved active wrist extension to 60 degrees to aide  with IADL- cooking, housekeeping   Status Not Met     OT LONG TERM GOAL #3   Title Patient will legibly write at paragraph level without pain or fatigue   Status Achieved     OT LONG TERM GOAL #4   Title Patient will demonstrate improved coordination on 9hole peg test by 3 seconds right hand   Status Achieved     OT LONG TERM GOAL #5   Title Patient will report no greater pain at rest (in right wrist / hand) than 3/10   Status Achieved               Plan - 05/15/17 1428    Clinical Impression Statement Plan to discharge patient from further OT at this point, although she may be a candidate for additional services if further surgery warranted.     Rehab Potential Good   OT Frequency 2x / week   OT Duration 8 weeks   OT Treatment/Interventions  Self-care/ADL training;Cryotherapy;Electrical Stimulation;Moist Heat;Fluidtherapy;Parrafin;Ultrasound;Therapeutic exercise;Neuromuscular education;DME and/or AE instruction;Passive range of motion;Scar mobilization;Manual Therapy;Splinting;Therapeutic activities;Patient/family education   Plan discharge OT   OT Home Exercise Plan coordination right UE, strength right hand, stretching wrist- holding off on  hand strengthening at this time   Consulted and Agree with Plan of Care Patient      Patient will benefit from skilled therapeutic intervention in order to improve the following deficits and impairments:  Decreased coordination, Decreased range of motion, Decreased skin integrity, Decreased scar mobility, Decreased strength, Increased edema, Impaired UE functional use, Pain, Impaired sensation  Visit Diagnosis: Pain in right wrist  Pain in joint of right hand  Stiffness of right wrist, not elsewhere classified  Stiffness of right hand, not elsewhere classified  Other lack of coordination  Other disturbances of skin sensation  Muscle weakness (generalized)      G-Codes - 2017/05/31 1430    Functional Assessment Tool Used (Outpatient only) skilled clinical judgement, 9 hole peg test   Functional Limitation Carrying, moving and handling objects   Carrying, Moving and Handling Objects Current Status (X5072) At least 20 percent but less than 40 percent impaired, limited or restricted   Carrying, Moving and Handling Objects Goal Status (U5750) At least 20 percent but less than 40 percent impaired, limited or restricted   Carrying, Moving and Handling Objects Discharge Status [phone]) At least 20 percent but less than 40 percent impaired, limited or restricted      Problem List Patient Active Problem List   Diagnosis Date Noted  . AKI (acute kidney injury) ([place]) 05/06/2017  . Dehydration 05/06/2017  . Chest pain 05/06/2017  . Wound infection after surgery 01/30/2017  . Palpitations  08/24/2016  . Noncompliance with CPAP treatment 12/28/2015  . Poor compliance with CPAP treatment 10/26/2014  . CPAP use counseling 07/28/2014  . OSA on CPAP 07/28/2014  . 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 Hypersomnolent 04/02/2014  . Obstructive sleep apnea 04/02/2014  . Non-compliance with treatment 03/05/2014  . Diabetes mellitus ([place]) 12/05/2011  . Hypertension 12/05/2011  . GERD (gastroesophageal reflux disease)   . 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 Chronic cough 07/26/2011  OCCUPATIONAL THERAPY DISCHARGE SUMMARY  Visits from Start of Care: 12  Current functional level related to goals / functional outcomes: Improved passive and active motion in right wrist and hand, decreased pain in wrist/hand, improved functional use / coordination in right hand   Remaining deficits: Trigger finger third digit right, edema over scar volar wrist right, pain 2-3/10 right hand  Education / Equipment: HEP coordination, strengthening, task modification  Plan: Patient agrees to discharge.  Patient goals were partially met. Patient is being discharged due to meeting the stated rehab goals.  ?????      Mariah Milling, OTR/L 2017/05/31, 2:31 PM  [facility] [address] [place], [place], [address] Phone: [phone]   Fax:  [phone]  Name: Stephanie Barber MRN: [id number] Date of Birth: 1941/02/27

## 2017-05-29 DIAGNOSIS — G5601 Carpal tunnel syndrome, right upper limb: Secondary | ICD-10-CM | POA: Diagnosis not present

## 2017-06-11 DIAGNOSIS — I10 Essential (primary) hypertension: Secondary | ICD-10-CM | POA: Diagnosis not present

## 2017-06-11 DIAGNOSIS — G5601 Carpal tunnel syndrome, right upper limb: Secondary | ICD-10-CM | POA: Diagnosis not present

## 2017-07-12 DIAGNOSIS — E119 Type 2 diabetes mellitus without complications: Secondary | ICD-10-CM | POA: Diagnosis not present

## 2017-07-12 DIAGNOSIS — M545 Low back pain: Secondary | ICD-10-CM | POA: Diagnosis not present

## 2017-07-12 DIAGNOSIS — Z0000 Encounter for general adult medical examination without abnormal findings: Secondary | ICD-10-CM | POA: Diagnosis not present

## 2017-07-12 DIAGNOSIS — N390 Urinary tract infection, site not specified: Secondary | ICD-10-CM | POA: Diagnosis not present

## 2017-07-12 DIAGNOSIS — I10 Essential (primary) hypertension: Secondary | ICD-10-CM | POA: Diagnosis not present

## 2017-07-12 DIAGNOSIS — D509 Iron deficiency anemia, unspecified: Secondary | ICD-10-CM | POA: Diagnosis not present

## 2017-07-12 DIAGNOSIS — Z23 Encounter for immunization: Secondary | ICD-10-CM | POA: Diagnosis not present

## 2017-07-12 DIAGNOSIS — K219 Gastro-esophageal reflux disease without esophagitis: Secondary | ICD-10-CM | POA: Diagnosis not present

## 2017-07-12 DIAGNOSIS — Z1389 Encounter for screening for other disorder: Secondary | ICD-10-CM | POA: Diagnosis not present

## 2017-07-12 DIAGNOSIS — E7800 Pure hypercholesterolemia, unspecified: Secondary | ICD-10-CM | POA: Diagnosis not present

## 2017-07-12 DIAGNOSIS — E441 Mild protein-calorie malnutrition: Secondary | ICD-10-CM | POA: Diagnosis not present

## 2017-07-12 DIAGNOSIS — G5600 Carpal tunnel syndrome, unspecified upper limb: Secondary | ICD-10-CM | POA: Diagnosis not present

## 2017-08-07 DIAGNOSIS — Z6825 Body mass index (BMI) 25.0-25.9, adult: Secondary | ICD-10-CM | POA: Diagnosis not present

## 2017-08-07 DIAGNOSIS — G5601 Carpal tunnel syndrome, right upper limb: Secondary | ICD-10-CM | POA: Diagnosis not present

## 2017-08-07 DIAGNOSIS — I10 Essential (primary) hypertension: Secondary | ICD-10-CM | POA: Diagnosis not present

## 2017-08-07 DIAGNOSIS — M65351 Trigger finger, right little finger: Secondary | ICD-10-CM | POA: Diagnosis not present

## 2017-08-15 DIAGNOSIS — M65331 Trigger finger, right middle finger: Secondary | ICD-10-CM | POA: Diagnosis not present

## 2017-08-15 DIAGNOSIS — M65351 Trigger finger, right little finger: Secondary | ICD-10-CM | POA: Diagnosis not present

## 2017-08-29 DIAGNOSIS — M65331 Trigger finger, right middle finger: Secondary | ICD-10-CM | POA: Diagnosis not present

## 2017-08-29 DIAGNOSIS — M65351 Trigger finger, right little finger: Secondary | ICD-10-CM | POA: Diagnosis not present

## 2017-09-26 DIAGNOSIS — M65331 Trigger finger, right middle finger: Secondary | ICD-10-CM | POA: Diagnosis not present

## 2017-09-26 DIAGNOSIS — M65351 Trigger finger, right little finger: Secondary | ICD-10-CM | POA: Diagnosis not present

## 2017-10-26 DIAGNOSIS — M25561 Pain in right knee: Secondary | ICD-10-CM | POA: Diagnosis not present

## 2017-11-02 DIAGNOSIS — M25561 Pain in right knee: Secondary | ICD-10-CM | POA: Diagnosis not present

## 2017-11-09 DIAGNOSIS — M25561 Pain in right knee: Secondary | ICD-10-CM | POA: Diagnosis not present

## 2017-11-09 DIAGNOSIS — S83231A Complex tear of medial meniscus, current injury, right knee, initial encounter: Secondary | ICD-10-CM | POA: Diagnosis not present

## 2017-11-21 DIAGNOSIS — M65351 Trigger finger, right little finger: Secondary | ICD-10-CM | POA: Diagnosis not present

## 2017-11-23 DIAGNOSIS — M65351 Trigger finger, right little finger: Secondary | ICD-10-CM | POA: Diagnosis not present

## 2017-12-14 DIAGNOSIS — S83241A Other tear of medial meniscus, current injury, right knee, initial encounter: Secondary | ICD-10-CM | POA: Diagnosis not present

## 2018-01-24 DIAGNOSIS — K219 Gastro-esophageal reflux disease without esophagitis: Secondary | ICD-10-CM | POA: Diagnosis not present

## 2018-01-24 DIAGNOSIS — E7800 Pure hypercholesterolemia, unspecified: Secondary | ICD-10-CM | POA: Diagnosis not present

## 2018-01-24 DIAGNOSIS — I10 Essential (primary) hypertension: Secondary | ICD-10-CM | POA: Diagnosis not present

## 2018-01-24 DIAGNOSIS — Z7984 Long term (current) use of oral hypoglycemic drugs: Secondary | ICD-10-CM | POA: Diagnosis not present

## 2018-01-24 DIAGNOSIS — D509 Iron deficiency anemia, unspecified: Secondary | ICD-10-CM | POA: Diagnosis not present

## 2018-01-24 DIAGNOSIS — E1169 Type 2 diabetes mellitus with other specified complication: Secondary | ICD-10-CM | POA: Diagnosis not present

## 2018-01-24 DIAGNOSIS — E441 Mild protein-calorie malnutrition: Secondary | ICD-10-CM | POA: Diagnosis not present

## 2018-01-24 DIAGNOSIS — G5600 Carpal tunnel syndrome, unspecified upper limb: Secondary | ICD-10-CM | POA: Diagnosis not present

## 2018-01-24 DIAGNOSIS — M25561 Pain in right knee: Secondary | ICD-10-CM | POA: Diagnosis not present

## 2018-01-24 DIAGNOSIS — M545 Low back pain: Secondary | ICD-10-CM | POA: Diagnosis not present

## 2018-02-07 DIAGNOSIS — R238 Other skin changes: Secondary | ICD-10-CM | POA: Diagnosis not present

## 2018-02-07 DIAGNOSIS — W57XXXD Bitten or stung by nonvenomous insect and other nonvenomous arthropods, subsequent encounter: Secondary | ICD-10-CM | POA: Diagnosis not present

## 2018-02-08 ENCOUNTER — Other Ambulatory Visit: Payer: Self-pay | Admitting: Orthopedic Surgery

## 2018-02-08 DIAGNOSIS — M65351 Trigger finger, right little finger: Secondary | ICD-10-CM | POA: Diagnosis not present

## 2018-02-08 DIAGNOSIS — M1811 Unilateral primary osteoarthritis of first carpometacarpal joint, right hand: Secondary | ICD-10-CM | POA: Diagnosis not present

## 2018-02-08 DIAGNOSIS — M65331 Trigger finger, right middle finger: Secondary | ICD-10-CM | POA: Diagnosis not present

## 2018-03-12 DIAGNOSIS — G8918 Other acute postprocedural pain: Secondary | ICD-10-CM | POA: Diagnosis not present

## 2018-03-12 DIAGNOSIS — M23231 Derangement of other medial meniscus due to old tear or injury, right knee: Secondary | ICD-10-CM | POA: Diagnosis not present

## 2018-03-12 DIAGNOSIS — M23331 Other meniscus derangements, other medial meniscus, right knee: Secondary | ICD-10-CM | POA: Diagnosis not present

## 2018-03-12 DIAGNOSIS — H25812 Combined forms of age-related cataract, left eye: Secondary | ICD-10-CM | POA: Diagnosis not present

## 2018-04-24 ENCOUNTER — Encounter (HOSPITAL_BASED_OUTPATIENT_CLINIC_OR_DEPARTMENT_OTHER): Payer: Self-pay | Admitting: *Deleted

## 2018-04-24 ENCOUNTER — Encounter (HOSPITAL_BASED_OUTPATIENT_CLINIC_OR_DEPARTMENT_OTHER)
Admission: RE | Admit: 2018-04-24 | Discharge: 2018-04-24 | Disposition: A | Discharge status: Home / Self Care | Payer: Medicare Other | Source: Ambulatory Visit | Attending: Orthopedic Surgery | Admitting: Orthopedic Surgery

## 2018-04-24 ENCOUNTER — Other Ambulatory Visit: Payer: Self-pay

## 2018-04-24 DIAGNOSIS — M65351 Trigger finger, right little finger: Secondary | ICD-10-CM | POA: Insufficient documentation

## 2018-04-24 DIAGNOSIS — Z01818 Encounter for other preprocedural examination: Secondary | ICD-10-CM | POA: Insufficient documentation

## 2018-04-24 LAB — BASIC METABOLIC PANEL
ANION GAP: 11 (ref 5–15)
BUN: 16 mg/dL (ref 6–20)
CO2: 24 mmol/L (ref 22–32)
Calcium: 10.1 mg/dL (ref 8.9–10.3)
Chloride: 107 mmol/L (ref 101–111)
Creatinine, Ser: 1.15 mg/dL — ABNORMAL HIGH (ref 0.44–1.00)
GFR, EST AFRICAN AMERICAN: 51 mL/min — AB (ref >60)
GFR, EST NON AFRICAN AMERICAN: 44 mL/min — AB (ref >60)
Glucose, Bld: 84 mg/dL (ref 65–99)
Potassium: 5.1 mmol/L (ref 3.5–5.1)
Sodium: 142 mmol/L (ref 135–145)

## 2018-04-30 ENCOUNTER — Encounter (HOSPITAL_BASED_OUTPATIENT_CLINIC_OR_DEPARTMENT_OTHER)
Admission: RE | Disposition: A | Discharge status: Home / Self Care | Payer: Self-pay | Source: Ambulatory Visit | Attending: Orthopedic Surgery

## 2018-04-30 ENCOUNTER — Encounter (HOSPITAL_BASED_OUTPATIENT_CLINIC_OR_DEPARTMENT_OTHER): Payer: Self-pay | Admitting: Anesthesiology

## 2018-04-30 ENCOUNTER — Ambulatory Visit (HOSPITAL_BASED_OUTPATIENT_CLINIC_OR_DEPARTMENT_OTHER): Payer: Medicare Other | Admitting: Anesthesiology

## 2018-04-30 ENCOUNTER — Ambulatory Visit (HOSPITAL_BASED_OUTPATIENT_CLINIC_OR_DEPARTMENT_OTHER)
Admission: RE | Admit: 2018-04-30 | Discharge: 2018-04-30 | Disposition: A | Discharge status: Home / Self Care | Payer: Medicare Other | Source: Ambulatory Visit | Attending: Orthopedic Surgery | Admitting: Orthopedic Surgery

## 2018-04-30 DIAGNOSIS — M1811 Unilateral primary osteoarthritis of first carpometacarpal joint, right hand: Secondary | ICD-10-CM | POA: Insufficient documentation

## 2018-04-30 DIAGNOSIS — Z885 Allergy status to narcotic agent status: Secondary | ICD-10-CM | POA: Diagnosis not present

## 2018-04-30 DIAGNOSIS — I10 Essential (primary) hypertension: Secondary | ICD-10-CM | POA: Diagnosis not present

## 2018-04-30 DIAGNOSIS — E119 Type 2 diabetes mellitus without complications: Secondary | ICD-10-CM | POA: Diagnosis not present

## 2018-04-30 DIAGNOSIS — Z87891 Personal history of nicotine dependence: Secondary | ICD-10-CM | POA: Diagnosis not present

## 2018-04-30 DIAGNOSIS — M65841 Other synovitis and tenosynovitis, right hand: Secondary | ICD-10-CM | POA: Diagnosis not present

## 2018-04-30 DIAGNOSIS — M65351 Trigger finger, right little finger: Secondary | ICD-10-CM | POA: Diagnosis not present

## 2018-04-30 DIAGNOSIS — G4733 Obstructive sleep apnea (adult) (pediatric): Secondary | ICD-10-CM | POA: Insufficient documentation

## 2018-04-30 DIAGNOSIS — M65331 Trigger finger, right middle finger: Secondary | ICD-10-CM | POA: Diagnosis not present

## 2018-04-30 DIAGNOSIS — K219 Gastro-esophageal reflux disease without esophagitis: Secondary | ICD-10-CM | POA: Diagnosis not present

## 2018-04-30 HISTORY — DX: Trigger finger, right index finger: M65.311

## 2018-04-30 HISTORY — PX: TRIGGER FINGER RELEASE: SHX641

## 2018-04-30 HISTORY — DX: Trigger thumb, right thumb: M65.311

## 2018-04-30 HISTORY — DX: Trigger finger, right middle finger: M65.331

## 2018-04-30 HISTORY — DX: Trigger finger, right ring finger: M65.341

## 2018-04-30 HISTORY — DX: Trigger thumb, right thumb: M65.351

## 2018-04-30 HISTORY — DX: Trigger thumb, right thumb: M65.321

## 2018-04-30 LAB — GLUCOSE, CAPILLARY: Glucose-Capillary: 148 mg/dL — ABNORMAL HIGH (ref 65–99)

## 2018-04-30 SURGERY — RELEASE, A1 PULLEY, FOR TRIGGER FINGER
Anesthesia: Regional | Site: Hand | Laterality: Right

## 2018-04-30 MED ORDER — FENTANYL CITRATE (PF) 100 MCG/2ML IJ SOLN
50.0000 ug | INTRAMUSCULAR | Status: DC | PRN
  Administered 2018-04-30: 50 ug via INTRAVENOUS

## 2018-04-30 MED ORDER — CEFAZOLIN SODIUM-DEXTROSE 2-4 GM/100ML-% IV SOLN
2.0000 g | INTRAVENOUS | Status: AC
  Administered 2018-04-30: 2 g via INTRAVENOUS

## 2018-04-30 MED ORDER — CEFAZOLIN SODIUM-DEXTROSE 2-4 GM/100ML-% IV SOLN
INTRAVENOUS | Status: AC
  Filled 2018-04-30: qty 100

## 2018-04-30 MED ORDER — OXYCODONE HCL 5 MG/5ML PO SOLN: 5.0000 mg | Freq: Once | ORAL | Status: DC | PRN

## 2018-04-30 MED ORDER — LIDOCAINE HCL (PF) 0.5 % IJ SOLN
INTRAMUSCULAR | Status: DC | PRN
  Administered 2018-04-30: 25 mL via INTRAVENOUS

## 2018-04-30 MED ORDER — PROPOFOL 10 MG/ML IV BOLUS
INTRAVENOUS | Status: DC | PRN
  Administered 2018-04-30 (×2): 10 mg via INTRAVENOUS

## 2018-04-30 MED ORDER — LACTATED RINGERS IV SOLN
INTRAVENOUS | Status: DC
  Administered 2018-04-30: 11:00:00 via INTRAVENOUS

## 2018-04-30 MED ORDER — MIDAZOLAM HCL 2 MG/2ML IJ SOLN: 1.0000 mg | INTRAMUSCULAR | Status: DC | PRN

## 2018-04-30 MED ORDER — ONDANSETRON HCL 4 MG/2ML IJ SOLN
INTRAMUSCULAR | Status: DC | PRN
  Administered 2018-04-30: 4 mg via INTRAVENOUS

## 2018-04-30 MED ORDER — CHLORHEXIDINE GLUCONATE 4 % EX LIQD: 60.0000 mL | Freq: Once | CUTANEOUS | Status: DC

## 2018-04-30 MED ORDER — SCOPOLAMINE 1 MG/3DAYS TD PT72: 1.0000 | MEDICATED_PATCH | Freq: Once | TRANSDERMAL | Status: DC | PRN

## 2018-04-30 MED ORDER — TRAMADOL HCL 50 MG PO TABS: 50.0000 mg | ORAL_TABLET | Freq: Four times a day (QID) | ORAL | 0 refills | Status: DC | PRN

## 2018-04-30 MED ORDER — ONDANSETRON HCL 4 MG/2ML IJ SOLN: 4.0000 mg | Freq: Once | INTRAMUSCULAR | Status: DC | PRN

## 2018-04-30 MED ORDER — FENTANYL CITRATE (PF) 100 MCG/2ML IJ SOLN: 25.0000 ug | INTRAMUSCULAR | Status: DC | PRN

## 2018-04-30 MED ORDER — FENTANYL CITRATE (PF) 100 MCG/2ML IJ SOLN
INTRAMUSCULAR | Status: AC
  Filled 2018-04-30: qty 2

## 2018-04-30 MED ORDER — OXYCODONE HCL 5 MG PO TABS: 5.0000 mg | ORAL_TABLET | Freq: Once | ORAL | Status: DC | PRN

## 2018-04-30 MED ORDER — BUPIVACAINE HCL (PF) 0.25 % IJ SOLN
INTRAMUSCULAR | Status: DC | PRN
  Administered 2018-04-30: 6.5 mL

## 2018-04-30 SURGICAL SUPPLY — 35 items
BANDAGE COBAN STERILE 2 (GAUZE/BANDAGES/DRESSINGS) ×2 IMPLANT
BLADE SURG 15 STRL LF DISP TIS (BLADE) ×1 IMPLANT
BLADE SURG 15 STRL SS (BLADE) ×2
BNDG CMPR 9X4 STRL LF SNTH (GAUZE/BANDAGES/DRESSINGS)
BNDG ESMARK 4X9 LF (GAUZE/BANDAGES/DRESSINGS) IMPLANT
CHLORAPREP W/TINT 26ML (MISCELLANEOUS) ×2 IMPLANT
CORD BIPOLAR FORCEPS 12FT (ELECTRODE) ×1 IMPLANT
COVER BACK TABLE 60X90IN (DRAPES) ×2 IMPLANT
COVER MAYO STAND STRL (DRAPES) ×2 IMPLANT
CUFF TOURNIQUET SINGLE 18IN (TOURNIQUET CUFF) ×1 IMPLANT
DECANTER SPIKE VIAL GLASS SM (MISCELLANEOUS) IMPLANT
DRAPE EXTREMITY T 121X128X90 (DRAPE) ×2 IMPLANT
DRAPE SURG 17X23 STRL (DRAPES) ×2 IMPLANT
GAUZE SPONGE 4X4 12PLY STRL (GAUZE/BANDAGES/DRESSINGS) ×2 IMPLANT
GAUZE XEROFORM 1X8 LF (GAUZE/BANDAGES/DRESSINGS) ×2 IMPLANT
GLOVE BIOGEL PI IND STRL 7.0 (GLOVE) IMPLANT
GLOVE BIOGEL PI IND STRL 8.5 (GLOVE) ×1 IMPLANT
GLOVE BIOGEL PI INDICATOR 7.0 (GLOVE) ×1
GLOVE BIOGEL PI INDICATOR 8.5 (GLOVE) ×1
GLOVE ECLIPSE 7.0 STRL STRAW (GLOVE) ×1 IMPLANT
GLOVE EXAM NITRILE MD LF STRL (GLOVE) ×1 IMPLANT
GLOVE SURG ORTHO 8.0 STRL STRW (GLOVE) ×2 IMPLANT
GOWN STRL REUS W/ TWL LRG LVL3 (GOWN DISPOSABLE) ×1 IMPLANT
GOWN STRL REUS W/TWL LRG LVL3 (GOWN DISPOSABLE) ×2
GOWN STRL REUS W/TWL XL LVL3 (GOWN DISPOSABLE) ×2 IMPLANT
NDL PRECISIONGLIDE 27X1.5 (NEEDLE) ×1 IMPLANT
NEEDLE PRECISIONGLIDE 27X1.5 (NEEDLE) ×2 IMPLANT
NS IRRIG 1000ML POUR BTL (IV SOLUTION) ×2 IMPLANT
PACK BASIN DAY SURGERY FS (CUSTOM PROCEDURE TRAY) ×2 IMPLANT
STOCKINETTE 4X48 STRL (DRAPES) ×2 IMPLANT
SUT ETHILON 4 0 PS 2 18 (SUTURE) ×2 IMPLANT
SYR BULB 3OZ (MISCELLANEOUS) ×2 IMPLANT
SYR CONTROL 10ML LL (SYRINGE) ×2 IMPLANT
TOWEL GREEN STERILE FF (TOWEL DISPOSABLE) ×3 IMPLANT
UNDERPAD 30X30 (UNDERPADS AND DIAPERS) ×2 IMPLANT

## 2018-04-30 NOTE — Transfer of Care (Signed)
Immediate Anesthesia Transfer of Care Note  Patient: Stephanie Barber  Procedure(s) Performed: RELEASE TRIGGER FINGER/A-1 PULLEY RIGHT MIDDLE  AND SMALLn  (Right Hand)  Patient Location: PACU  Anesthesia Type:MAC and Bier block  Level of Consciousness: awake  Airway & Oxygen Therapy: Patient Spontanous Breathing  Post-op Assessment: Report given to RN and Post -op Vital signs reviewed and stable  Post vital signs: Reviewed and stable  Last Vitals:  Vitals Value Taken Time  BP 128/71 04/30/2018 12:09 PM  Temp    Pulse 57 04/30/2018 12:10 PM  Resp 13 04/30/2018 12:10 PM  SpO2 97 % 04/30/2018 12:10 PM  Vitals shown include unvalidated device data.  Last Pain: There were no vitals filed for this visit.       Complications: No apparent anesthesia complications

## 2018-04-30 NOTE — H&P (Signed)
Stephanie Barber is an 77 y.o. female.   Chief Complaint: catching middle and small right handHPI: Stephanie Barber is a 33 year old right-hand-dominant female referred by Dr. Cyndy Freeze for consultation regarding catching of her right middle right small fingers. This been going on for the past several months she is status post carpal tunnel release done by Dr. Cyndy Freeze on her right hand. She states that the fingers began triggering after the surgery. She has no history of injury. She states that she has an aching burning pain with a VAS score 7 date over 10 small finger greater than middle finger. She has worn her wrist splint which has given her no relief. .She has had each of these digits injected on 2 occasions. He complains of occasional catching to the small more frequent catching to the middle. The small cause minimal discomfort the middle of moderate discomfort when it catches.     She has a history of diabetes arthritis no history of thyroid problems or gout. Family history is positive diabetes negative for thyroid problems arthritis and gout. He states it is worse in the morning. Does take Celebrex      Past Medical History:  Diagnosis Date  . Arthritis   . Chronic headache   . Complication of anesthesia    allergy to Novocaine   . Diabetes mellitus   . GERD (gastroesophageal reflux disease)   . Heart murmur   . High cholesterol   . Hyperlipidemia   . Hypertension   . Intracranial atherosclerosis    per MRA  . Migraine   . Narcolepsy and cataplexy   . OSA on CPAP    states has not used CPAP in over a year  . Rectal prolapse   . Trigger finger of all digits of right hand     Past Surgical History:  Procedure Laterality Date  . ABDOMINAL HYSTERECTOMY    . BACK SURGERY     L4,L5 discectomy  . CARDIAC CATHETERIZATION    . CARPAL TUNNEL RELEASE Right 01/18/2017  . KIDNEY SURGERY  1998   Right.  growth removed  . rectal prolapse repair  12/05/11  . ROTATOR CUFF REPAIR     Left  .  SHOULDER ARTHROSCOPY WITH SUBACROMIAL DECOMPRESSION, ROTATOR CUFF REPAIR AND BICEP TENDON REPAIR Left 05/25/2015   Procedure: SHOULDER ARTHROSCOPY WITH SUBACROMIAL DECOMPRESSION, ROTATOR CUFF REPAIR AND BICEP TENODESIS, DEBRIDEMENT. ;  Surgeon: Meredith Pel, MD;  Location: [facility];  Service: Orthopedics;  Laterality: Left;  LEFT SHOULDER ROTATOR CUFF TEAR REPAIR, ARTHROSCOPY, DEBRIDEMENT, BICEPS TENODESIS, SUBACROMIAL DECOMPRESSION.  . TOTAL ABDOMINAL HYSTERECTOMY    . TUBAL LIGATION      Family History  Problem Relation Age of Onset  . Heart disease Mother   . Throat cancer Father   . Cancer Father        stomach  . Kidney cancer Brother   . Heart disease Brother    Social History:  reports that she quit smoking about 37 years ago. She has never used smokeless tobacco. She reports that she does not drink alcohol or use drugs.  Allergies:  Allergies  Allergen Reactions  . Morphine And Related Anaphylaxis  . Novocain [Procaine Hcl] Anaphylaxis    No medications prior to admission.    No results found for this or any previous visit (from the past 48 hour(s)).  No results found.   Pertinent items are noted in HPI.  Height 4\' 11"  (1.499 m), weight 53.5 kg (118 lb).  General appearance: alert, cooperative  and appears stated age Head: Normocephalic, without obvious abnormality Neck: no JVD Resp: clear to auscultation bilaterally Cardio: regular rate and rhythm, S1, S2 normal, no murmur, click, rub or gallop GI: soft, non-tender; bowel sounds normal; no masses,  no organomegaly Extremities: catching middle and small fingers right hand Pulses: 2+ and symmetric Skin: Skin color, texture, turgor normal. No rashes or lesions or normal Neurologic: Grossly normal Incision/Wound: na  Assessment/Plan Assessment:  1. Primary osteoarthritis of first carpometacarpal joint of right hand  2. Trigger middle finger of right hand  3. Trigger little finger of right hand    Plan: We  have discussed possibility of surgical DR decompression of the the middle and small fingers right hand A1 pulley flexor tendons. Prepare postoperative course were discussed along with risks and complications. She is aware that there is no guarantee to the surgery the possibility of infection recurrence injury to arteries nerves tendons complete relief symptoms dystrophy. She would like to proceed. She is scheduled as an outpatient under regional anesthesia for release A1 pulley right middle right small fingers.      Gaylene Moylan R 04/30/2018, 9:53 AM

## 2018-04-30 NOTE — Anesthesia Procedure Notes (Signed)
Anesthesia Regional Block: Bier block (IV Regional)   Pre-Anesthetic Checklist: ,, timeout performed, Correct Patient, Correct Site, Correct Laterality, Correct Procedure,, site marked, surgical consent,, at surgeon's request Needles:  Injection technique: Single-shot  Needle Type: Other      Needle Gauge: 22     Additional Needles:   Procedures:,,,,, intact distal pulses, Esmarch exsanguination, single tourniquet utilized,  Narrative:   Performed by: [employer]

## 2018-04-30 NOTE — Discharge Instructions (Addendum)

## 2018-04-30 NOTE — Op Note (Signed)
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  This is not responded to conservative treatment multiple injections.  Pre-peri-and postoperative course been discussed along with risks and complications.  She is aware there is no guarantee to the surgery the possibility of infection recurrence injury to arteries nerves tendons and complete relief symptoms and dystrophy.  In the preoperative area the patient is seen extremity marked by both patient and surgeon antibiotic given  Procedure: Patient is brought to the operating room where a forearm IV regional anesthetic was carried out without difficulty under the direction the anesthesia department.  She was prepped using ChloraPrep in the supine position with the right arm free.  A three-minute dry time was allowed and a timeout taken to confirm patient procedure.  The middle finger was attended to first.  An oblique incision was made over the A1 pulley of the right middle finger carried down through subcutaneous tissue.  The neurovascular structures identified protected with retractors the A1 pulley was identified in its radial aspect this was released a small incision was made in the central aspect of the A2 pulley tenosynovial tissue proximally was separated.  The 2 tendons were then separated breaking any adhesions.  The finger placed through full range of motion no further triggering was noted.  The small finger was attended to next.  A separate incision was then made over the small finger A1 pulley obliquely.  This was carried down through subcutaneous tissue.   Retractors placed protecting neurovascular bundle radially and ulnarly.  The A1 pulley was released on its radial aspect with a small incision being made centrally and A2.  The tenosynovial tissue proximally was separated with blunt dissection.  The 2 tendons were separated breaking any adhesions of the pain the tenosynovial tissue.  The finger probe placed through full passive range of motion no further triggering was noted.  Both wounds were then irrigated with saline.  Each was closed with interrupted 4-0 nylon sutures.  Local infiltration port quarter percent bupivacaine without epinephrine was given approximately 6 cc was used.  A sterile compressive dressing with the fingers free was applied.  Inflation of the tourniquet all fingers immediately pink.  She was taken to the recovery room for observation in satisfactory condition.  She will be discharged home to return [facility] in 1 week on Tylenol ibuprofen for pain with tramadol for breakthrough.

## 2018-04-30 NOTE — Anesthesia Postprocedure Evaluation (Signed)
Anesthesia Post Note  Patient: Stephanie Barber  Procedure(s) Performed: RELEASE TRIGGER FINGER/A-1 PULLEY RIGHT MIDDLE  AND SMALLn  (Right Hand)     Patient location during evaluation: PACU Anesthesia Type: Bier Block Level of consciousness: awake and alert Pain management: pain level controlled Vital Signs Assessment: post-procedure vital signs reviewed and stable Respiratory status: spontaneous breathing, nonlabored ventilation and respiratory function stable Cardiovascular status: stable and blood pressure returned to baseline Anesthetic complications: no    Last Vitals:  Vitals:   04/30/18 1215 04/30/18 1230  BP: 134/64 126/69  Pulse: 62 60  Resp: 17 14  Temp:    SpO2: 98% 97%    Last Pain:  Vitals:   04/30/18 1230  PainSc: 0-No pain                 Audry Pili

## 2018-04-30 NOTE — Anesthesia Preprocedure Evaluation (Addendum)
Anesthesia Evaluation  Patient identified by MRN, date of birth, ID band Patient awake    Reviewed: Allergy & Precautions, NPO status , Patient's Chart, lab work & pertinent test results, reviewed documented beta blocker date and time   Airway Mallampati: II  TM Distance: >3 FB Neck ROM: Full    Dental  (+) Partial Upper, Partial Lower   Pulmonary sleep apnea , former smoker,    breath sounds clear to auscultation       Cardiovascular hypertension, Pt. on medications and Pt. on home beta blockers + Peripheral Vascular Disease   Rhythm:Regular Rate:Normal  '18 TTE - EF 60% to 65%. Grade 1 diastolic dysfunction. Trivial AI. 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    GI/Hepatic Neg liver ROS, GERD  Controlled,  Endo/Other  diabetes, Type 2, Oral Hypoglycemic Agents  Renal/GU negative Renal ROS     Musculoskeletal  (+) Arthritis ,   Abdominal   Peds  Hematology negative hematology ROS (+)   Anesthesia Other Findings   Reproductive/Obstetrics                            Anesthesia Physical  Anesthesia Plan  ASA: III  Anesthesia Plan: Bier Block and Bier Block-LIDOCAINE ONLY   Post-op Pain Management:    Induction: Intravenous  PONV Risk Score and Plan: 2 and Propofol infusion and Treatment may vary due to age or medical condition  Airway Management Planned: Natural Airway and Simple Face Mask  Additional Equipment: None  Intra-op Plan:   Post-operative Plan:   Informed Consent: I have reviewed the patients History and Physical, chart, labs and discussed the procedure including the risks, benefits and alternatives for the proposed anesthesia with the patient or authorized representative who has indicated his/her understanding and acceptance.     Plan Discussed with: CRNA and Anesthesiologist  Anesthesia Plan Comments:         Anesthesia  Quick Evaluation

## 2018-04-30 NOTE — Brief Op Note (Signed)
04/30/2018  12:06 PM  PATIENT:  Stephanie Barber  77 y.o. female  PRE-OPERATIVE DIAGNOSIS:  TRIGGER RIGHT MIDDLE & SMALL  POST-OPERATIVE DIAGNOSIS:  TRIGGER RIGHT MIDDLE & SMALL  PROCEDURE:  Procedure(s) with comments: RELEASE TRIGGER FINGER/A-1 PULLEY RIGHT MIDDLE  AND SMALLn  (Right) - FAB, Bier block  SURGEON:  Surgeon(s) and Role:    * Daryll Brod, MD - Primary  PHYSICIAN ASSISTANT:   ASSISTANTS: none   ANESTHESIA:   local, regional and IV sedation  EBL:  5 mL   BLOOD ADMINISTERED:none  DRAINS: none   LOCAL MEDICATIONS USED:  BUPIVICAINE   SPECIMEN:  No Specimen  DISPOSITION OF SPECIMEN:  N/A  COUNTS:  YES  TOURNIQUET:   Total Tourniquet Time Documented: Forearm (Right) - 21 minutes Total: Forearm (Right) - 21 minutes   DICTATION: .Dragon Dictation  PLAN OF CARE: Discharge to home after PACU  PATIENT DISPOSITION:  PACU - hemodynamically stable.

## 2018-05-01 ENCOUNTER — Encounter (HOSPITAL_BASED_OUTPATIENT_CLINIC_OR_DEPARTMENT_OTHER): Payer: Self-pay | Admitting: Orthopedic Surgery

## 2018-05-01 NOTE — Addendum Note (Signed)
Addendum  created 05/01/18 1244 by Romayne Ticas, Ernesta Amble, CRNA   Charge Capture section accepted

## 2018-06-17 DIAGNOSIS — M65331 Trigger finger, right middle finger: Secondary | ICD-10-CM | POA: Diagnosis not present

## 2018-07-29 DIAGNOSIS — G5600 Carpal tunnel syndrome, unspecified upper limb: Secondary | ICD-10-CM | POA: Diagnosis not present

## 2018-07-29 DIAGNOSIS — E7800 Pure hypercholesterolemia, unspecified: Secondary | ICD-10-CM | POA: Diagnosis not present

## 2018-07-29 DIAGNOSIS — Z1389 Encounter for screening for other disorder: Secondary | ICD-10-CM | POA: Diagnosis not present

## 2018-07-29 DIAGNOSIS — R202 Paresthesia of skin: Secondary | ICD-10-CM | POA: Diagnosis not present

## 2018-07-29 DIAGNOSIS — G3184 Mild cognitive impairment, so stated: Secondary | ICD-10-CM | POA: Diagnosis not present

## 2018-07-29 DIAGNOSIS — K219 Gastro-esophageal reflux disease without esophagitis: Secondary | ICD-10-CM | POA: Diagnosis not present

## 2018-07-29 DIAGNOSIS — Z0000 Encounter for general adult medical examination without abnormal findings: Secondary | ICD-10-CM | POA: Diagnosis not present

## 2018-07-29 DIAGNOSIS — Z23 Encounter for immunization: Secondary | ICD-10-CM | POA: Diagnosis not present

## 2018-07-29 DIAGNOSIS — Z1211 Encounter for screening for malignant neoplasm of colon: Secondary | ICD-10-CM | POA: Diagnosis not present

## 2018-07-29 DIAGNOSIS — E1169 Type 2 diabetes mellitus with other specified complication: Secondary | ICD-10-CM | POA: Diagnosis not present

## 2018-07-29 DIAGNOSIS — D509 Iron deficiency anemia, unspecified: Secondary | ICD-10-CM | POA: Diagnosis not present

## 2018-07-29 DIAGNOSIS — I10 Essential (primary) hypertension: Secondary | ICD-10-CM | POA: Diagnosis not present

## 2018-08-15 DIAGNOSIS — E1169 Type 2 diabetes mellitus with other specified complication: Secondary | ICD-10-CM | POA: Diagnosis not present

## 2018-08-15 DIAGNOSIS — E860 Dehydration: Secondary | ICD-10-CM | POA: Diagnosis not present

## 2018-08-15 DIAGNOSIS — G5600 Carpal tunnel syndrome, unspecified upper limb: Secondary | ICD-10-CM | POA: Diagnosis not present

## 2018-08-15 DIAGNOSIS — M25561 Pain in right knee: Secondary | ICD-10-CM | POA: Diagnosis not present

## 2018-08-15 DIAGNOSIS — G44209 Tension-type headache, unspecified, not intractable: Secondary | ICD-10-CM | POA: Diagnosis not present

## 2018-08-15 DIAGNOSIS — R202 Paresthesia of skin: Secondary | ICD-10-CM | POA: Diagnosis not present

## 2018-08-15 DIAGNOSIS — E7800 Pure hypercholesterolemia, unspecified: Secondary | ICD-10-CM | POA: Diagnosis not present

## 2018-08-15 DIAGNOSIS — I10 Essential (primary) hypertension: Secondary | ICD-10-CM | POA: Diagnosis not present

## 2018-08-15 DIAGNOSIS — K219 Gastro-esophageal reflux disease without esophagitis: Secondary | ICD-10-CM | POA: Diagnosis not present

## 2018-08-15 DIAGNOSIS — M545 Low back pain: Secondary | ICD-10-CM | POA: Diagnosis not present

## 2018-08-15 DIAGNOSIS — Z7984 Long term (current) use of oral hypoglycemic drugs: Secondary | ICD-10-CM | POA: Diagnosis not present

## 2018-08-15 DIAGNOSIS — D509 Iron deficiency anemia, unspecified: Secondary | ICD-10-CM | POA: Diagnosis not present

## 2018-09-23 DIAGNOSIS — J208 Acute bronchitis due to other specified organisms: Secondary | ICD-10-CM | POA: Diagnosis not present

## 2018-10-23 DIAGNOSIS — M545 Low back pain: Secondary | ICD-10-CM | POA: Diagnosis not present

## 2018-10-23 DIAGNOSIS — K219 Gastro-esophageal reflux disease without esophagitis: Secondary | ICD-10-CM | POA: Diagnosis not present

## 2018-11-11 DIAGNOSIS — M7918 Myalgia, other site: Secondary | ICD-10-CM | POA: Diagnosis not present

## 2018-11-11 DIAGNOSIS — E119 Type 2 diabetes mellitus without complications: Secondary | ICD-10-CM | POA: Diagnosis not present

## 2019-03-18 DIAGNOSIS — M545 Low back pain: Secondary | ICD-10-CM | POA: Diagnosis not present

## 2019-03-18 DIAGNOSIS — E7800 Pure hypercholesterolemia, unspecified: Secondary | ICD-10-CM | POA: Diagnosis not present

## 2019-03-18 DIAGNOSIS — E1149 Type 2 diabetes mellitus with other diabetic neurological complication: Secondary | ICD-10-CM | POA: Diagnosis not present

## 2019-03-18 DIAGNOSIS — K219 Gastro-esophageal reflux disease without esophagitis: Secondary | ICD-10-CM | POA: Diagnosis not present

## 2019-03-18 DIAGNOSIS — R202 Paresthesia of skin: Secondary | ICD-10-CM | POA: Diagnosis not present

## 2019-03-18 DIAGNOSIS — I10 Essential (primary) hypertension: Secondary | ICD-10-CM | POA: Diagnosis not present

## 2019-03-18 DIAGNOSIS — G5600 Carpal tunnel syndrome, unspecified upper limb: Secondary | ICD-10-CM | POA: Diagnosis not present

## 2019-03-18 DIAGNOSIS — D509 Iron deficiency anemia, unspecified: Secondary | ICD-10-CM | POA: Diagnosis not present

## 2019-03-26 DIAGNOSIS — E1149 Type 2 diabetes mellitus with other diabetic neurological complication: Secondary | ICD-10-CM | POA: Diagnosis not present

## 2019-03-26 DIAGNOSIS — E7800 Pure hypercholesterolemia, unspecified: Secondary | ICD-10-CM | POA: Diagnosis not present

## 2019-03-26 DIAGNOSIS — D509 Iron deficiency anemia, unspecified: Secondary | ICD-10-CM | POA: Diagnosis not present

## 2019-03-26 DIAGNOSIS — Z7984 Long term (current) use of oral hypoglycemic drugs: Secondary | ICD-10-CM | POA: Diagnosis not present

## 2019-04-01 DIAGNOSIS — H25813 Combined forms of age-related cataract, bilateral: Secondary | ICD-10-CM | POA: Diagnosis not present

## 2019-04-01 DIAGNOSIS — E119 Type 2 diabetes mellitus without complications: Secondary | ICD-10-CM | POA: Diagnosis not present

## 2019-04-30 DIAGNOSIS — H25811 Combined forms of age-related cataract, right eye: Secondary | ICD-10-CM | POA: Diagnosis not present

## 2019-05-06 ENCOUNTER — Other Ambulatory Visit: Payer: Self-pay | Admitting: *Deleted

## 2019-05-06 DIAGNOSIS — Z20822 Contact with and (suspected) exposure to covid-19: Secondary | ICD-10-CM

## 2019-05-11 LAB — NOVEL CORONAVIRUS, NAA: SARS-CoV-2, NAA: NOT DETECTED

## 2019-05-26 DIAGNOSIS — M129 Arthropathy, unspecified: Secondary | ICD-10-CM | POA: Diagnosis not present

## 2019-05-26 DIAGNOSIS — R51 Headache: Secondary | ICD-10-CM | POA: Diagnosis not present

## 2019-05-26 DIAGNOSIS — R2242 Localized swelling, mass and lump, left lower limb: Secondary | ICD-10-CM | POA: Diagnosis not present

## 2019-05-26 DIAGNOSIS — R2241 Localized swelling, mass and lump, right lower limb: Secondary | ICD-10-CM | POA: Diagnosis not present

## 2019-05-26 DIAGNOSIS — R5383 Other fatigue: Secondary | ICD-10-CM | POA: Diagnosis not present

## 2019-05-26 DIAGNOSIS — E559 Vitamin D deficiency, unspecified: Secondary | ICD-10-CM | POA: Diagnosis not present

## 2019-05-26 DIAGNOSIS — Z1159 Encounter for screening for other viral diseases: Secondary | ICD-10-CM | POA: Diagnosis not present

## 2019-05-26 DIAGNOSIS — Z79899 Other long term (current) drug therapy: Secondary | ICD-10-CM | POA: Diagnosis not present

## 2019-05-26 DIAGNOSIS — R9431 Abnormal electrocardiogram [ECG] [EKG]: Secondary | ICD-10-CM | POA: Diagnosis not present

## 2019-06-03 ENCOUNTER — Emergency Department (HOSPITAL_COMMUNITY): Payer: Medicare Other

## 2019-06-03 ENCOUNTER — Encounter (HOSPITAL_COMMUNITY): Payer: Self-pay

## 2019-06-03 ENCOUNTER — Emergency Department (HOSPITAL_COMMUNITY)
Admission: EM | Admit: 2019-06-03 | Discharge: 2019-06-04 | Disposition: A | Discharge status: Home / Self Care | Payer: Medicare Other | Attending: Emergency Medicine | Admitting: Emergency Medicine

## 2019-06-03 ENCOUNTER — Other Ambulatory Visit: Payer: Self-pay

## 2019-06-03 DIAGNOSIS — M79604 Pain in right leg: Secondary | ICD-10-CM

## 2019-06-03 DIAGNOSIS — I10 Essential (primary) hypertension: Secondary | ICD-10-CM | POA: Insufficient documentation

## 2019-06-03 DIAGNOSIS — M7989 Other specified soft tissue disorders: Secondary | ICD-10-CM | POA: Diagnosis not present

## 2019-06-03 DIAGNOSIS — M79605 Pain in left leg: Secondary | ICD-10-CM | POA: Diagnosis not present

## 2019-06-03 DIAGNOSIS — Z7984 Long term (current) use of oral hypoglycemic drugs: Secondary | ICD-10-CM | POA: Diagnosis not present

## 2019-06-03 DIAGNOSIS — Z87891 Personal history of nicotine dependence: Secondary | ICD-10-CM | POA: Diagnosis not present

## 2019-06-03 DIAGNOSIS — Z7982 Long term (current) use of aspirin: Secondary | ICD-10-CM | POA: Insufficient documentation

## 2019-06-03 DIAGNOSIS — Z79899 Other long term (current) drug therapy: Secondary | ICD-10-CM | POA: Insufficient documentation

## 2019-06-03 DIAGNOSIS — E119 Type 2 diabetes mellitus without complications: Secondary | ICD-10-CM | POA: Insufficient documentation

## 2019-06-03 DIAGNOSIS — M79606 Pain in leg, unspecified: Secondary | ICD-10-CM | POA: Diagnosis not present

## 2019-06-03 DIAGNOSIS — R011 Cardiac murmur, unspecified: Secondary | ICD-10-CM | POA: Diagnosis not present

## 2019-06-03 DIAGNOSIS — R9431 Abnormal electrocardiogram [ECG] [EKG]: Secondary | ICD-10-CM | POA: Diagnosis not present

## 2019-06-03 DIAGNOSIS — I824Y1 Acute embolism and thrombosis of unspecified deep veins of right proximal lower extremity: Secondary | ICD-10-CM

## 2019-06-03 DIAGNOSIS — K219 Gastro-esophageal reflux disease without esophagitis: Secondary | ICD-10-CM | POA: Diagnosis not present

## 2019-06-03 DIAGNOSIS — E7800 Pure hypercholesterolemia, unspecified: Secondary | ICD-10-CM | POA: Insufficient documentation

## 2019-06-03 DIAGNOSIS — R791 Abnormal coagulation profile: Secondary | ICD-10-CM | POA: Insufficient documentation

## 2019-06-03 DIAGNOSIS — Z780 Asymptomatic menopausal state: Secondary | ICD-10-CM | POA: Diagnosis not present

## 2019-06-03 DIAGNOSIS — R7989 Other specified abnormal findings of blood chemistry: Secondary | ICD-10-CM | POA: Diagnosis not present

## 2019-06-03 DIAGNOSIS — R209 Unspecified disturbances of skin sensation: Secondary | ICD-10-CM | POA: Diagnosis not present

## 2019-06-03 LAB — CBC
HCT: 36.1 % (ref 36.0–46.0)
Hemoglobin: 11.8 g/dL — ABNORMAL LOW (ref 12.0–15.0)
MCH: 29.2 pg (ref 26.0–34.0)
MCHC: 32.7 g/dL (ref 30.0–36.0)
MCV: 89.4 fL (ref 80.0–100.0)
Platelets: 269 10*3/uL (ref 150–400)
RBC: 4.04 MIL/uL (ref 3.87–5.11)
RDW: 12.2 % (ref 11.5–15.5)
WBC: 6 10*3/uL (ref 4.0–10.5)
nRBC: 0 % (ref 0.0–0.2)

## 2019-06-03 LAB — BASIC METABOLIC PANEL
Anion gap: 12 (ref 5–15)
BUN: 14 mg/dL (ref 8–23)
CO2: 21 mmol/L — ABNORMAL LOW (ref 22–32)
Calcium: 9.9 mg/dL (ref 8.9–10.3)
Chloride: 107 mmol/L (ref 98–111)
Creatinine, Ser: 1.19 mg/dL — ABNORMAL HIGH (ref 0.44–1.00)
GFR calc Af Amer: 50 mL/min — ABNORMAL LOW (ref >60)
GFR calc non Af Amer: 43 mL/min — ABNORMAL LOW (ref >60)
Glucose, Bld: 81 mg/dL (ref 70–99)
Potassium: 4.2 mmol/L (ref 3.5–5.1)
Sodium: 140 mmol/L (ref 135–145)

## 2019-06-03 MED ORDER — IOHEXOL 350 MG/ML SOLN
75.0000 mL | Freq: Once | INTRAVENOUS | Status: AC | PRN
  Administered 2019-06-03: 75 mL via INTRAVENOUS

## 2019-06-03 NOTE — ED Notes (Signed)
Stephanie Barber [phone] pts daughter wants an update

## 2019-06-03 NOTE — ED Notes (Signed)
Patient transported to CT 

## 2019-06-03 NOTE — ED Triage Notes (Signed)
Pt sent here from [facility] for PE study. Pt has known blood clot to right common femoral vein dx there today. Pt denies any chest pain or SOB. Pt a.o, ambulatory. Pt has been having pain in her right leg for 2 weeks

## 2019-06-03 NOTE — ED Provider Notes (Signed)
Care assumed from [place], please see her note for full details but in brief Stephanie Barber is a 78 y.o. female who was diagnosed at outpatient clinic with right femoral DVT, and sent to the ED to rule out PE.  Labs overall unremarkable creatinine of 1.19 with CrCl of 50, hemoglobin stable and normal platelet count.  CT Angio of the chest shows no evidence of PE.  Patient was not started on anticoagulation at her outpatient visit today.  Physical Exam  BP (!) 147/75   Pulse 89   Temp 98.3 F (36.8 C) (Oral)   Resp 14   SpO2 98%   Physical Exam Vitals signs and nursing note reviewed.  Constitutional:      General: She is not in acute distress.    Appearance: Normal appearance. She is well-developed. She is not diaphoretic.     Comments: Sitting comfortably in no acute distress.  HENT:     Head: Normocephalic and atraumatic.  Eyes:     General:        Right eye: No discharge.        Left eye: No discharge.  Pulmonary:     Effort: Pulmonary effort is normal. No respiratory distress.  Skin:    General: Skin is warm and dry.  Neurological:     Mental Status: She is alert and oriented to person, place, and time.     Coordination: Coordination normal.  Psychiatric:        Mood and Affect: Mood normal.        Behavior: Behavior normal.     ED Course/Procedures   Labs Reviewed  BASIC METABOLIC PANEL - Abnormal; Notable for the following components:      Result Value   CO2 21 (*)    Creatinine, Ser 1.19 (*)    GFR calc non Af Amer 43 (*)    GFR calc Af Amer 50 (*)    All other components within normal limits  CBC - Abnormal; Notable for the following components:   Hemoglobin 11.8 (*)    All other components within normal limits   Ct Angio Chest Pe W/cm &/or Wo Cm  Result Date: 06/03/2019 CLINICAL DATA:  PE suspected, intermediate prob, positive D-dimer Right femoral DVT diagnosed today at [facility]. EXAM: CT ANGIOGRAPHY CHEST WITH CONTRAST TECHNIQUE:  Multidetector CT imaging of the chest was performed using the standard protocol during bolus administration of intravenous contrast. Multiplanar CT image reconstructions and MIPs were obtained to evaluate the vascular anatomy. CONTRAST:  31mL OMNIPAQUE IOHEXOL 350 MG/ML SOLN COMPARISON:  Chest radiograph 05/06/2017 FINDINGS: Cardiovascular: There are no filling defects within the pulmonary arteries to suggest pulmonary embolus. The thoracic aorta is normal in caliber without dissection. Mild aortic atherosclerosis. Heart is normal in size. No pericardial effusion. Mediastinum/Nodes: Heterogeneous enlarged right lobe of the thyroid gland without dominant nodule, punctate right thyroid calcifications. Small to moderate hiatal hernia. Mild esophageal wall thickening. No enlarged mediastinal or hilar lymph nodes. Lungs/Pleura: Mild dependent subsegmental atelectasis in both lower lobes. No confluent airspace disease. No pleural fluid. No pulmonary edema. The trachea and mainstem bronchi are patent. Upper Abdomen: No acute abnormality. Musculoskeletal: There are no acute or suspicious osseous abnormalities. Review of the MIP images confirms the above findings. IMPRESSION: 1. No pulmonary embolus or acute intrathoracic abnormality. 2. Small to moderate hiatal hernia. Mild esophageal wall thickening, can be seen with reflux or esophagitis. Aortic Atherosclerosis (ICD10-I70.0). Electronically Signed   By: Aurther Loft.D.  On: 06/03/2019 23:51     Procedures  MDM   Patient was diagnosed with DVT today, CT angio does not show any evidence of PE today.  Given appropriate kidney function patient can be started on Xarelto for continued anticoagulation, pharmacy consulted and provided education on this.  Patient was given first dose of Xarelto here in the ED tonight.  Patient's husband was updated.  Discussed precautions with Xarelto.  Return precautions and outpatient follow-up discussed.  Patient expresses  understanding and agreement with plan.  Discharged home in good condition.  Final diagnoses:  Right leg pain  Acute deep vein thrombosis (DVT) of proximal vein of right lower extremity [facility])    ED Discharge Orders         Ordered    Rivaroxaban 15 & 20 MG TBPK     06/04/19 0114               Jacqlyn Larsen, PA-C 81/84/03 [id number]    Delora Fuel, MD 60/67/70 [phone]

## 2019-06-03 NOTE — ED Notes (Signed)
Pt daughter arrives to ED lobby requesting to speak with pt. Informed her of visitor policy. Daughter then states under no circumstance should the pt be admitted regardless of what the CT scan says... states she wants her mother to go home to die........ Informed PA and primary RN of conversation I had with daughter.

## 2019-06-03 NOTE — ED Provider Notes (Signed)
[place] EMERGENCY DEPARTMENT Provider Note   CSN: [id number] Arrival date & time: 06/03/19  1711  History   Chief Complaint Chief Complaint  Patient presents with   Leg Pain    HPI Stephanie Barber is a 78 y.o. female with past medical history significant for diabetes, GERD, heart murmur, hypercholesterolemia who presents for evaluation of needing CT scan.  Patient has had intermittent swelling to her bilateral lower extremities x1 month.  Has also had pain to her right lower extremity times weeks as well.  Was seen at [facility] where she had outpatient Doppler studies done.  She presents with paper that shows right CFV partially compressible with subacute appearing thrombus.  She denies any chest pain, shortness of breath.  Denies fever, chills, nausea, vomiting, abdominal pain, pelvic pain, decreased range of motion, numbness or tingling, redness, swelling or warmth to her lower extremities.  Denies additional aggravating or alleviating factors.  Has not taken anything for pain.  She is been ambulatory PTA without difficulty.  History obtained from patient and past medical records.  No interpreter was used.     HPI  Past Medical History:  Diagnosis Date   Arthritis    Chronic headache    Complication of anesthesia    allergy to Novocaine    Diabetes mellitus    GERD (gastroesophageal reflux disease)    Heart murmur    High cholesterol    Hyperlipidemia    Hypertension    Intracranial atherosclerosis    per MRA   Migraine    Narcolepsy and cataplexy    OSA on CPAP    states has not used CPAP in over a year   Rectal prolapse    Trigger finger of all digits of right hand     Patient Active Problem List   Diagnosis Date Noted   AKI (acute kidney injury) ([place]) 05/06/2017   Dehydration 05/06/2017   Chest pain 05/06/2017   Wound infection after surgery 01/30/2017   Palpitations 08/24/2016   Noncompliance with  CPAP treatment 12/28/2015   Poor compliance with CPAP treatment 10/26/2014   CPAP use counseling 07/28/2014   OSA on CPAP 07/28/2014   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   Hypersomnolent 04/02/2014   Obstructive sleep apnea 04/02/2014   Non-compliance with treatment 03/05/2014   Diabetes mellitus ([place]) 12/05/2011   Hypertension 12/05/2011   GERD (gastroesophageal reflux disease)    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  growth removed   rectal prolapse repair  12/05/11   ROTATOR CUFF REPAIR     Left   SHOULDER ARTHROSCOPY WITH SUBACROMIAL DECOMPRESSION, ROTATOR CUFF REPAIR AND BICEP TENDON REPAIR Left 05/25/2015   Procedure: SHOULDER ARTHROSCOPY WITH SUBACROMIAL DECOMPRESSION, ROTATOR CUFF REPAIR AND BICEP TENODESIS, DEBRIDEMENT. ;  Surgeon: Meredith Pel, MD;  Location: [facility];  Service: Orthopedics;  Laterality: Left;  LEFT SHOULDER ROTATOR CUFF TEAR REPAIR, ARTHROSCOPY, DEBRIDEMENT, BICEPS TENODESIS, SUBACROMIAL DECOMPRESSION.   TOTAL ABDOMINAL HYSTERECTOMY     TRIGGER FINGER RELEASE Right 04/30/2018   Procedure: RELEASE TRIGGER FINGER/A-1 PULLEY RIGHT MIDDLE  AND SMALLn ;  Surgeon: Daryll Brod, MD;  Location: [facility];  Service: Orthopedics;  Laterality: Right;  FAB,  Bier block   TUBAL LIGATION       OB History   No obstetric history on file.      Home Medications    Prior to Admission medications   Medication Sig Start Date End Date Taking? Authorizing Provider  acetaminophen-codeine (TYLENOL #3) 300-30 MG tablet Take 1 tablet by mouth every 6 (six) hours as needed (pain).  01/18/17  Yes [provider]  amLODipine (NORVASC) 10 MG tablet Take 10 mg by mouth daily after  breakfast.    Yes [provider]  aspirin EC 81 MG tablet Take 81 mg by mouth at bedtime.   Yes [provider]  fish oil-omega-3 fatty acids 1000 MG capsule Take 1 g by mouth daily.     Yes [provider]  gabapentin (NEURONTIN) 400 MG capsule Take 800 mg by mouth at bedtime.    Yes [provider]  losartan (COZAAR) 100 MG tablet Take 100 mg by mouth daily after breakfast.    Yes [provider]  metFORMIN (GLUCOPHAGE) 500 MG tablet Take 500 mg by mouth 2 (two) times daily with a meal.    Yes [provider]  metoprolol (TOPROL-XL) 50 MG 24 hr tablet Take 50 mg by mouth daily after breakfast.    Yes [provider]  Polyethyl Glycol-Propyl Glycol (SYSTANE ULTRA) 0.4-0.3 % SOLN Place 1 drop into both eyes daily.     Yes [provider]  polysaccharide iron (NIFEREX) 150 MG CAPS capsule Take 150 mg by mouth daily.    Yes [provider]  pravastatin (PRAVACHOL) 40 MG tablet Take 40 mg by mouth at bedtime.   Yes [provider]  traMADol (ULTRAM) 50 MG tablet Take 1 tablet (50 mg total) by mouth every 6 (six) hours as needed. Patient taking differently: Take 50 mg by mouth every 6 (six) hours as needed for moderate pain.  04/30/18  Yes Daryll Brod, MD    Family History Family History  Problem Relation Age of Onset   Heart disease Mother    Throat cancer Father    Cancer Father        stomach   Kidney cancer Brother    Heart disease Brother     Social History Social History   Tobacco Use   Smoking status: Former Smoker    Quit date: 11/13/1980    Years since quitting: 38.5   Smokeless tobacco: Never Used   Tobacco comment: quit in 1982  Substance Use Topics   Alcohol use: No    Alcohol/week: 0.0 standard drinks   Drug use: No     Allergies   Morphine and related   Review of Systems Review of Systems  Constitutional: Negative.   HENT: Negative.   Respiratory: Negative.     Cardiovascular: Negative.   Gastrointestinal: Negative.   Genitourinary: Negative.   Musculoskeletal:       Bilateral lower extremity swelling x month  Skin: Negative.   Neurological: Negative.   All other systems reviewed and are negative.  Physical Exam Updated Vital Signs BP (!) 147/75    Pulse 89    Temp 98.3 F (36.8 C) (Oral)    Resp 14    SpO2 98%   Physical Exam Vitals signs and nursing note reviewed.  Constitutional:      General: She is not in acute distress.    Appearance: She is not ill-appearing, toxic-appearing or diaphoretic.  HENT:     Head: Normocephalic and atraumatic.  Jaw: There is normal jaw occlusion.     Nose:     Comments: Clear rhinorrhea and congestion to bilateral nares.  No sinus tenderness.    Mouth/Throat:     Comments: Posterior oropharynx clear.  Mucous membranes moist.  Tonsils without erythema or exudate.  Uvula midline without deviation.  No evidence of PTA or RPA.  No drooling, dysphasia or trismus.  Phonation normal. Neck:     Trachea: Trachea and phonation normal.     Meningeal: Brudzinski's sign and Kernig's sign absent.     Comments: No Neck stiffness or neck rigidity.  No meningismus.  No cervical lymphadenopathy. Cardiovascular:     Pulses:          Femoral pulses are 2+ on the right side and 2+ on the left side.      Dorsalis pedis pulses are 2+ on the right side and 2+ on the left side.       Posterior tibial pulses are 2+ on the right side and 2+ on the left side.     Comments: No murmurs rubs or gallops. Pulmonary:     Comments: Clear to auscultation bilaterally without wheeze, rhonchi or rales.  No accessory muscle usage.  Able speak in full sentences. Abdominal:     Comments: Soft, nontender without rebound or guarding.  No CVA tenderness.  Musculoskeletal:     Comments: Moves all 4 extremities without difficulty.  Lower extremities without edema, erythema or warmth.  Skin:    Comments: Brisk capillary refill.  No rashes  or lesions.  Neurological:     Mental Status: She is alert.     Comments: Ambulatory in department without difficulty.  Cranial nerves II through XII grossly intact.  No facial droop.  No aphasia.    ED Treatments / Results  Labs (all labs ordered are listed, but only abnormal results are displayed) Labs Reviewed  BASIC METABOLIC PANEL - Abnormal; Notable for the following components:      Result Value   CO2 21 (*)    Creatinine, Ser 1.19 (*)    GFR calc non Af Amer 43 (*)    GFR calc Af Amer 50 (*)    All other components within normal limits  CBC - Abnormal; Notable for the following components:   Hemoglobin 11.8 (*)    All other components within normal limits    EKG EKG Interpretation  Date/Time:  Tuesday June 03 2019 20:09:13 EDT Ventricular Rate:  85 PR Interval:    QRS Duration: 76 QT Interval:  378 QTC Calculation: 450 R Axis:   17 Text Interpretation:  Sinus rhythm no acute st/ts compared with prior 6/18 Confirmed by Aletta Edouard [phone]) on 06/03/2019 8:10:59 PM   Radiology Ct Angio Chest Pe W/cm &/or Wo Cm  Result Date: 06/03/2019 CLINICAL DATA:  PE suspected, intermediate prob, positive D-dimer Right femoral DVT diagnosed today at [facility]. EXAM: CT ANGIOGRAPHY CHEST WITH CONTRAST TECHNIQUE: Multidetector CT imaging of the chest was performed using the standard protocol during bolus administration of intravenous contrast. Multiplanar CT image reconstructions and MIPs were obtained to evaluate the vascular anatomy. CONTRAST:  91mL OMNIPAQUE IOHEXOL 350 MG/ML SOLN COMPARISON:  Chest radiograph 05/06/2017 FINDINGS: Cardiovascular: There are no filling defects within the pulmonary arteries to suggest pulmonary embolus. The thoracic aorta is normal in caliber without dissection. Mild aortic atherosclerosis. Heart is normal in size. No pericardial effusion. Mediastinum/Nodes: Heterogeneous enlarged right lobe of the thyroid gland without dominant nodule,  punctate right  thyroid calcifications. Small to moderate hiatal hernia. Mild esophageal wall thickening. No enlarged mediastinal or hilar lymph nodes. Lungs/Pleura: Mild dependent subsegmental atelectasis in both lower lobes. No confluent airspace disease. No pleural fluid. No pulmonary edema. The trachea and mainstem bronchi are patent. Upper Abdomen: No acute abnormality. Musculoskeletal: There are no acute or suspicious osseous abnormalities. Review of the MIP images confirms the above findings. IMPRESSION: 1. No pulmonary embolus or acute intrathoracic abnormality. 2. Small to moderate hiatal hernia. Mild esophageal wall thickening, can be seen with reflux or esophagitis. Aortic Atherosclerosis (ICD10-I70.0). Electronically Signed   By: Keith Rake M.D.   On: 06/03/2019 23:51    Procedures Procedures (including critical care time)  Medications Ordered in ED Medications  iohexol (OMNIPAQUE) 350 MG/ML injection 75 mL (75 mLs Intravenous Contrast Given 06/03/19 2331)    Initial Impression / Assessment and Plan / ED Course  I have reviewed the triage vital signs and the nursing notes.  Pertinent labs & imaging results that were available during my care of the patient were reviewed by me and considered in my medical decision making (see chart for details).  78 year old female appears otherwise well presents for evaluation of needing CT scan.  She is afebrile, nonseptic, non-ill-appearing.  Patient has had 1 month of lower extremity swelling as well as right lower extremity pain.  She is been ambulatory PTA.  She has a nonfocal neurologic exam without neurologic deficits.  She has no lower extremity swelling, redness or warmth.  She has 2+ radial, DP, PT pulses bilaterally.  Per [facility] medical record which patient presented with patient with right CFV partially compressible with subacute appearing thrombus. She is mildly tachycardic to 105 in department however denies any chest pain, shortness of  breath.  She is not hypoxic and not tachypneic.  Will obtain CT scan to rule out PE.  She was not previously started on anticoagulation outpatient. No hx of hemorrhage. Patient and family states under no circumstances would they want admission for any sort of treatment. Requesting outpatient treatment only. Discussed with patient and family we will get CT to r/o PE and discuss findings/ treatment at that time.  No evidence of claudication on exam.  She appears neurovascularly intact.  Compartments soft. No pain out of proportion to exam, pallor.  She is ambulatory without difficulty.  Abdomen soft, nontender without rebound or guarding.  Difficulty with IV access. IV team consulted.  EKG without ST/T changes.  No tachycardia.  I have attempted to update patients family  X 2 and phone goes directly to voicemail.  Care transferred to [facility] [place] who will follow up on CT readings.     Final Clinical Impressions(s) / ED Diagnoses   Final diagnoses:  Right leg pain    ED Discharge Orders    None       Imre Vecchione A, PA-C 06/04/19 0008    Hayden Rasmussen, MD 06/04/19 [id number]

## 2019-06-04 DIAGNOSIS — I824Y1 Acute embolism and thrombosis of unspecified deep veins of right proximal lower extremity: Secondary | ICD-10-CM | POA: Diagnosis not present

## 2019-06-04 MED ORDER — RIVAROXABAN 20 MG PO TABS: 20.0000 mg | ORAL_TABLET | Freq: Every day | ORAL | Status: DC

## 2019-06-04 MED ORDER — RIVAROXABAN (XARELTO) VTE STARTER PACK (15 & 20 MG): ORAL_TABLET | ORAL | 0 refills | Status: DC

## 2019-06-04 MED ORDER — RIVAROXABAN 15 MG PO TABS
15.0000 mg | ORAL_TABLET | Freq: Two times a day (BID) | ORAL | Status: DC
  Administered 2019-06-04: 15 mg via ORAL
  Filled 2019-06-04 (×2): qty 1

## 2019-06-04 NOTE — Discharge Instructions (Signed)
Your diagnosed with a DVT at the outpatient clinic today.  Your CT scan here did not show any evidence of blood clot.  Please take Xarelto as directed and please follow-up with your primary care doctor.  Be careful while on this medication as it puts you for increased risk for bleeding, if you have any injuries to your head or trauma you should report to the emergency department for evaluation.  If you develop chest pain, shortness of breath or any other new or concerning symptoms return to the emergency department.

## 2019-06-04 NOTE — Progress Notes (Signed)
ANTICOAGULATION CONSULT NOTE - Initial Consult  Pharmacy Consult for Xarelto  Indication: DVT  Allergies  Allergen Reactions  . Morphine And Related Anaphylaxis    Vital Signs: Temp: 98.3 F (36.8 C) (07/21 1717) Temp Source: Oral (07/21 1717) BP: 147/75 (07/21 2245) Pulse Rate: 89 (07/21 2245)  Labs: Recent Labs    06/03/19 1725  HGB 11.8*  HCT 36.1  PLT 269  CREATININE 1.19*    CrCl cannot be calculated (Unknown ideal weight.).   Medical History: Past Medical History:  Diagnosis Date  . Arthritis   . Chronic headache   . Complication of anesthesia    allergy to Novocaine   . Diabetes mellitus   . GERD (gastroesophageal reflux disease)   . Heart murmur   . High cholesterol   . Hyperlipidemia   . Hypertension   . Intracranial atherosclerosis    per MRA  . Migraine   . Narcolepsy and cataplexy   . OSA on CPAP    states has not used CPAP in over a year  . Rectal prolapse   . Trigger finger of all digits of right hand    Assessment: 78 y/o F presents to the ED for CT to r/o PE. Diagnosed with DVT as an outpatient earlier on 7/21. No anti-coagulation started yet. CT is negative for PE. CBC/renal function OK for Xarelto.   Goal of Therapy:  Monitor platelets by anticoagulation protocol: Yes   Plan:  -Xarelto 15 mg twice daily with meals for 21 days, then give 20 mg daily with supper -Daily CBC if inpatient, pt likely getting discharged -Monitor for bleeding  Narda Bonds, PharmD, BCPS Clinical Pharmacist Phone: [phone]

## 2019-06-04 NOTE — ED Notes (Signed)
Discharge instructions discussed with pt. Pt verbalized understanding. Pt stable and ambulatory. No signature pad available. 

## 2019-06-04 NOTE — ED Notes (Signed)
RN attempted to daughter. No answer.

## 2019-06-09 DIAGNOSIS — R2241 Localized swelling, mass and lump, right lower limb: Secondary | ICD-10-CM | POA: Diagnosis not present

## 2019-06-09 DIAGNOSIS — L309 Dermatitis, unspecified: Secondary | ICD-10-CM | POA: Diagnosis not present

## 2019-06-09 DIAGNOSIS — I803 Phlebitis and thrombophlebitis of lower extremities, unspecified: Secondary | ICD-10-CM | POA: Diagnosis not present

## 2019-06-09 DIAGNOSIS — R51 Headache: Secondary | ICD-10-CM | POA: Diagnosis not present

## 2019-06-11 DIAGNOSIS — I517 Cardiomegaly: Secondary | ICD-10-CM | POA: Diagnosis not present

## 2019-06-11 DIAGNOSIS — R51 Headache: Secondary | ICD-10-CM | POA: Diagnosis not present

## 2019-06-11 DIAGNOSIS — I10 Essential (primary) hypertension: Secondary | ICD-10-CM | POA: Diagnosis not present

## 2019-06-11 DIAGNOSIS — I82409 Acute embolism and thrombosis of unspecified deep veins of unspecified lower extremity: Secondary | ICD-10-CM | POA: Diagnosis not present

## 2019-06-11 DIAGNOSIS — M79606 Pain in leg, unspecified: Secondary | ICD-10-CM | POA: Diagnosis not present

## 2019-06-11 DIAGNOSIS — D689 Coagulation defect, unspecified: Secondary | ICD-10-CM | POA: Diagnosis not present

## 2019-06-18 ENCOUNTER — Ambulatory Visit: Payer: Medicare Other | Admitting: Neurology

## 2019-06-26 DIAGNOSIS — I82409 Acute embolism and thrombosis of unspecified deep veins of unspecified lower extremity: Secondary | ICD-10-CM | POA: Diagnosis not present

## 2019-06-26 DIAGNOSIS — Z7901 Long term (current) use of anticoagulants: Secondary | ICD-10-CM | POA: Diagnosis not present

## 2019-06-26 DIAGNOSIS — D689 Coagulation defect, unspecified: Secondary | ICD-10-CM | POA: Diagnosis not present

## 2019-06-30 ENCOUNTER — Encounter: Payer: Self-pay | Admitting: Neurology

## 2019-06-30 ENCOUNTER — Telehealth: Payer: Self-pay | Admitting: Neurology

## 2019-06-30 ENCOUNTER — Ambulatory Visit (INDEPENDENT_AMBULATORY_CARE_PROVIDER_SITE_OTHER): Payer: Medicare Other | Admitting: Neurology

## 2019-06-30 ENCOUNTER — Other Ambulatory Visit: Payer: Self-pay

## 2019-06-30 VITALS — BP 127/73 | HR 72 | Temp 97.5°F | Ht 59.0 in | Wt 129.5 lb

## 2019-06-30 DIAGNOSIS — R413 Other amnesia: Secondary | ICD-10-CM | POA: Insufficient documentation

## 2019-06-30 DIAGNOSIS — R51 Headache: Secondary | ICD-10-CM

## 2019-06-30 DIAGNOSIS — R519 Headache, unspecified: Secondary | ICD-10-CM | POA: Insufficient documentation

## 2019-06-30 NOTE — Progress Notes (Signed)
PATIENT: Stephanie Barber DOB: 1941/05/25  Chief Complaint  Patient presents with   Pain in head    Reports an event of sharp, stabbing pain on the left side of her head.  The pain lasted about one minute and she states it was a 10 on the pain scale.  It occurred a few more times, that same day, that was not as severe.  Now the pain is intermittent.    PCP    Sherald Hess., MD     HISTORICAL: Stephanie Barber is a 78 year old female, referred by her primary care physician Dr. Kellie Shropshire for evaluation of headache, initial evaluation was on June 30, 2019.  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 Brett Fairy a few times, most recent visit was Dec 2015  She also underwent PS and MSLT - MSLT had one  SREMs and an average time to fall asleep of  8 minutes , her Epworth is 20 points this patient has severe hypersomnia and is at risk when driving. She is now taking nuvigil 252m qam.  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  She lost to follow-up since 2016  Today she complains of recurrent transient left occipital area  sharp radiating pain, started in July 2020, one severe episode last about couple minutes, she had a few smaller recurrent episode, she denies visual loss, no body achy pain  She is no longer using CPAP machine anymore, sleeps well at nighttime, occasionally daytime sleepiness, before COVID-19, she has her own catering business, she now noticed occasionally word finding difficulty, short-term memory loss  REVIEW OF SYSTEMS: Full 14 system review of systems performed and notable only for as above All other review of systems were negative.  ALLERGIES: Allergies  Allergen Reactions   Morphine And Related Anaphylaxis    HOME MEDICATIONS: Current Outpatient Medications  Medication Sig Dispense Refill   acetaminophen-codeine (TYLENOL #3) 300-30 MG tablet Take 1 tablet by mouth every 6 (six) hours as needed (pain).   0   amLODipine (NORVASC) 10 MG tablet Take 10 mg by mouth daily after breakfast.      aspirin EC 81 MG tablet Take 81 mg by mouth at bedtime.     budesonide-formoterol (SYMBICORT) 160-4.5 MCG/ACT inhaler Inhale 2 puffs into the lungs 2 (two) times daily.     cetirizine (ZYRTEC) 10 MG tablet Take 10 mg by mouth daily.     Cinnamon 500 MG capsule Take 500 mg by mouth daily.     Ferrous Sulfate (IRON PO) Take 1 tablet by mouth daily.     fish oil-omega-3 fatty acids 1000 MG capsule Take 1 g by mouth daily.  gabapentin (NEURONTIN) 400 MG capsule Take 400 mg by mouth at bedtime.      losartan (COZAAR) 100 MG tablet Take 100 mg by mouth daily after breakfast.      metFORMIN (GLUCOPHAGE) 500 MG tablet Take 500 mg by mouth 2 (two) times daily with a meal.      metoprolol (TOPROL-XL) 50 MG 24 hr tablet Take 50 mg by mouth daily after breakfast.      Polyethyl Glycol-Propyl Glycol (SYSTANE ULTRA) 0.4-0.3 % SOLN Place 1 drop into both eyes daily.       pravastatin (PRAVACHOL) 40 MG tablet Take 40 mg by mouth at bedtime.     ranitidine (ZANTAC) 300 MG tablet Take 300 mg by  mouth at bedtime.     rivaroxaban (XARELTO) 20 MG TABS tablet Take 20 mg by mouth daily with supper.     No current facility-administered medications for this visit.     PAST MEDICAL HISTORY: Past Medical History:  Diagnosis Date   Arthritis    Chronic headache    Complication of anesthesia    allergy to Novocaine    Diabetes mellitus    GERD (gastroesophageal reflux disease)    Heart murmur    High cholesterol    Hyperlipidemia    Hypertension    Intracranial atherosclerosis    per MRA   Migraine    Narcolepsy and cataplexy    OSA on CPAP    states has not used CPAP in over a year   Rectal prolapse    Trigger finger of all digits of right hand     PAST SURGICAL HISTORY: Past Surgical History:  Procedure Laterality Date   ABDOMINAL HYSTERECTOMY     BACK SURGERY     L4,L5 discectomy   CARDIAC CATHETERIZATION     CARPAL TUNNEL RELEASE Right 01/18/2017   KIDNEY SURGERY  1998   Right.  growth removed   rectal prolapse repair  12/05/11   ROTATOR CUFF REPAIR     Left   SHOULDER ARTHROSCOPY WITH SUBACROMIAL DECOMPRESSION, ROTATOR CUFF REPAIR AND BICEP TENDON REPAIR Left 05/25/2015   Procedure: SHOULDER ARTHROSCOPY WITH SUBACROMIAL DECOMPRESSION, ROTATOR CUFF REPAIR AND BICEP TENODESIS, DEBRIDEMENT. ;  Surgeon: Meredith Pel, MD;  Location: [place];  Service: Orthopedics;  Laterality: Left;  LEFT SHOULDER ROTATOR CUFF TEAR REPAIR, ARTHROSCOPY, DEBRIDEMENT, BICEPS TENODESIS, SUBACROMIAL DECOMPRESSION.   TOTAL ABDOMINAL HYSTERECTOMY     TRIGGER FINGER RELEASE Right 04/30/2018   Procedure: RELEASE TRIGGER FINGER/A-1 PULLEY RIGHT MIDDLE  AND SMALLn ;  Surgeon: Daryll Brod, MD;  Location: [place] [facility];  Service: Orthopedics;  Laterality: Right;  FAB, Bier block   TUBAL LIGATION      FAMILY HISTORY: Family History  Problem Relation Age of Onset   Heart disease Mother    Throat cancer Father    Cancer Father        stomach   Kidney  cancer Brother    Heart disease Brother     SOCIAL HISTORY: Social History   Socioeconomic History   Marital status: Married    Spouse name: Stephanie Barber   Number of children: 4   Years of education: 14   Highest education level: Not on file  Occupational History   Occupation: retired. prev worked at [employer].  Social Needs   Emergency planning/management officer strain: Not on file   Food insecurity    Worry: Not on file    Inability: Not on file   Transportation needs    Medical:  Not on file    Non-medical: Not on file  Tobacco Use   Smoking status: Former Smoker    Quit date: 11/13/1980    Years since quitting: 38.6   Smokeless tobacco: Never Used   Tobacco comment: quit in 1982  Substance and Sexual Activity   Alcohol use: No    Alcohol/week: 0.0 standard drinks   Drug use: No   Sexual activity: Not Currently  Lifestyle   Physical activity    Days per week: Not on file    Minutes per session: Not on file   Stress: Not on file  Relationships   Social connections    Talks on phone: Not on file    Gets together: Not on file    Attends religious service: Not on file    Active member of club or organization: Not on file    Attends meetings of clubs or organizations: Not on file    Relationship status: Not on file   Intimate partner violence    Fear of current or ex partner: Not on file    Emotionally abused: Not on file    Physically abused: Not on file    Forced sexual activity: Not on file  Other Topics Concern   Not on file  Social History Narrative   21 st January 2014 , patient underwent PS and MSLT - MSLT had one  SREMs and an average time to fall asleep of  *.8 minutes , her Ewort is 20 points,: facit: this patient has severe hypersomnia and is at risk when driving. medication in form ogf nuvigil smaples had been dispensed to her but she ha not yet taken it. Her  since SREM onset is raising the suspecion  of narcolepsy with her clinical symptoms of her  EDS , score  and vivid  dreams, sleep hallucinations and dream intrusion,  and reported cataplexy . In detail -discussion of diagnosis and treatment  takes place today , first with nuvigil and if insufficient, with XYREM. Her CPAP treats her OSA very well, and she uses it 6 hours  or more each night,  residual AHI of 1.1  would not allow for OSA to be still explaining this degree of sleepiness. 2 downloads were reviewed. labs reviewed.    Patient is married Stephanie Barber) and lives at home with her husband and grandchild.   Patient has four children   Patient is retired.   Patient has a college education.   Patient is right-handed.   Patient does not drink any caffeine.        PHYSICAL EXAM   Vitals:   06/30/19 1009  BP: 127/73  Pulse: 72  Temp: (!) 97.5 F (36.4 C)  Weight: 129 lb 8 oz (58.7 kg)  Height: _0  (1.499 m)    Not recorded      Body mass index is 26.16 kg/m.  PHYSICAL EXAMNIATION:  Gen: NAD, conversant, well nourised, obese, well groomed                     Cardiovascular: Regular rate rhythm, no peripheral edema, warm, nontender. Eyes: Conjunctivae clear without exudates or hemorrhage Neck: Supple, no carotid bruits. Pulmonary: Clear to auscultation bilaterally   NEUROLOGICAL EXAM: MMSE - Mini Mental State Exam 06/30/2019 03/29/2015 07/28/2014  Orientation to time _1 Orientation to Place _2 Registration _3 Attention/ Calculation _4 Recall _5 Language- name 2 objects 2  2 2  Language- repeat _0 Language- follow 3 step command _1 Language- read & follow direction _2 Write a sentence _3 Copy design _4 Total score _5 CRANIAL NERVES: CN II: Visual fields are full to confrontation.  Pupils are round equal and briskly reactive to light. CN III, IV, VI: extraocular movement are normal. No ptosis. CN V: Facial sensation is intact to pinprick in all 3 divisions bilaterally. Corneal responses are intact.  CN VII:  Face is symmetric with normal eye closure and smile. CN VIII: Hearing is normal to rubbing fingers CN IX, X: Palate elevates symmetrically. Phonation is normal. CN XI: Head turning and shoulder shrug are intact CN XII: Tongue is midline with normal movements and no atrophy.  MOTOR: There is no pronator drift of out-stretched arms. Muscle bulk and tone are normal. Muscle strength is normal.  REFLEXES: Reflexes are 2+ and symmetric at the biceps, triceps, knees, and ankles. Plantar responses are flexor.  SENSORY: Intact to light touch, pinprick, positional sensation and vibratory sensation are intact in fingers and toes.  COORDINATION: Rapid alternating movements and fine finger movements are intact. There is no dysmetria on finger-to-nose and heel-knee-shin.    GAIT/STANCE: Posture is normal. Gait is steady with normal steps, base, arm swing, and turning. Heel and toe walking are normal. Tandem gait is normal.  Romberg is absent.   DIAGNOSTIC DATA (LABS, IMAGING, TESTING) - I reviewed patient records, labs, notes, testing and imaging myself where available.   ASSESSMENT AND PLAN  Stephanie Barber is a 78 y.o. female   Left occipital area radiating pain Memory loss  Differentiation diagnosis including occipital neuralgia  MRI of brain to rule out structural lesion  ESR C-reactive protein to rule out temporal arteritis  I have suggested her heating pad, neck stretching exercise, massage, Tylenol as needed for recurrent pain   Marcial Pacas, M.D. Ph.D.  [facility] [address] [place], [place] [address] Ph: [phone] Fax: [phone]  CC: Referring Provider

## 2019-06-30 NOTE — Telephone Encounter (Signed)
Medicare/[facility] order sent to [facility]. No auth they will reach out to the patient to schedule.  

## 2019-07-01 DIAGNOSIS — H25812 Combined forms of age-related cataract, left eye: Secondary | ICD-10-CM | POA: Diagnosis not present

## 2019-07-01 LAB — SEDIMENTATION RATE: Sed Rate: 3 mm/hr (ref 0–40)

## 2019-07-01 LAB — C-REACTIVE PROTEIN: CRP: <1 mg/L (ref 0–10)

## 2019-07-01 LAB — TSH: TSH: 1.22 u[IU]/mL (ref 0.450–4.500)

## 2019-07-04 DIAGNOSIS — H25812 Combined forms of age-related cataract, left eye: Secondary | ICD-10-CM | POA: Diagnosis not present

## 2019-07-14 DIAGNOSIS — L300 Nummular dermatitis: Secondary | ICD-10-CM | POA: Diagnosis not present

## 2019-07-29 ENCOUNTER — Other Ambulatory Visit: Payer: Self-pay

## 2019-07-29 ENCOUNTER — Ambulatory Visit
Admission: RE | Admit: 2019-07-29 | Discharge: 2019-07-29 | Disposition: A | Discharge status: Home / Self Care | Payer: Medicare Other | Source: Ambulatory Visit | Attending: Neurology | Admitting: Neurology

## 2019-07-29 DIAGNOSIS — R413 Other amnesia: Secondary | ICD-10-CM

## 2019-07-30 NOTE — Progress Notes (Signed)
.  J

## 2019-08-07 DIAGNOSIS — Z23 Encounter for immunization: Secondary | ICD-10-CM | POA: Diagnosis not present

## 2019-08-07 DIAGNOSIS — L300 Nummular dermatitis: Secondary | ICD-10-CM | POA: Diagnosis not present

## 2019-08-07 DIAGNOSIS — L821 Other seborrheic keratosis: Secondary | ICD-10-CM | POA: Diagnosis not present

## 2019-08-28 DIAGNOSIS — I82409 Acute embolism and thrombosis of unspecified deep veins of unspecified lower extremity: Secondary | ICD-10-CM | POA: Diagnosis not present

## 2019-09-11 DIAGNOSIS — D6862 Lupus anticoagulant syndrome: Secondary | ICD-10-CM | POA: Diagnosis not present

## 2019-09-11 DIAGNOSIS — Z7901 Long term (current) use of anticoagulants: Secondary | ICD-10-CM | POA: Diagnosis not present

## 2019-09-11 DIAGNOSIS — I82409 Acute embolism and thrombosis of unspecified deep veins of unspecified lower extremity: Secondary | ICD-10-CM | POA: Diagnosis not present

## 2019-09-30 ENCOUNTER — Ambulatory Visit: Payer: Medicare Other | Admitting: Neurology

## 2019-10-06 NOTE — Progress Notes (Signed)
PATIENT: Stephanie Barber DOB: [birth year]  REASON FOR VISIT: follow up HISTORY FROM: patient  HISTORY OF PRESENT ILLNESS: Today 10/07/19  HISTORY Stephanie Barber is a 78 year old female, referred by her primary care physician Dr. Kellie Shropshire for evaluation of headache, initial evaluation was on June 30, 2019.  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 Brett Fairy a few times, most recent visit was Dec 2015  She also underwent PS and MSLT - MSLT had one SREMs and an average time to fall asleep of 8 minutes , her Epworth is 20 points this patient has severe hypersomnia and is at risk when driving. She is now taking nuvigil '250mg'$  qam.  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 .  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  MRI of the brain was unremarkable 07/29/2019.  Laboratory evaluation, CRP, sed rate, TSH were normal.  She indicates that the occipital pain has subsided.  It may come in brief spurts, is tolerable, and does not last long or frequently.  She indicates her memory is stable, continued difficulty with short-term memory.  She thinks she has become aware of this, because she has been stuck in the house during the pandemic.  She sleeps well at night, no longer uses CPAP. She drives, and cooks a lot for other people.  She remains quite active.  She presents today for follow-up unaccompanied.  REVIEW OF SYSTEMS: Out of a complete 14 system review of symptoms, the patient complains only of the following symptoms, and all other reviewed systems are negative.  Memory loss  ALLERGIES: Allergies  Allergen Reactions  . Morphine And Related Anaphylaxis    HOME MEDICATIONS: Outpatient Medications Prior to Visit  Medication Sig Dispense Refill  . acetaminophen-codeine (TYLENOL #3) 300-30 MG tablet Take 1 tablet by mouth every 6 (six) hours as needed (pain).   0  . amLODipine (NORVASC) 10 MG tablet Take 10 mg by mouth daily after breakfast.     . aspirin EC 81 MG tablet Take 81 mg by mouth at bedtime.    . budesonide-formoterol (SYMBICORT) 160-4.5 MCG/ACT inhaler Inhale 2 puffs into the lungs 2 (two) times daily.    Marland Kitchen  cetirizine (ZYRTEC) 10 MG tablet Take 10 mg by mouth daily.    . Cinnamon 500 MG capsule Take 500 mg by mouth daily.    . Ferrous Sulfate (IRON PO) Take 1 tablet by mouth daily.    . fish oil-omega-3 fatty acids 1000 MG capsule Take 1 g by mouth daily.      Marland Kitchen gabapentin (NEURONTIN) 400 MG capsule Take 400 mg by mouth at bedtime.      Marland Kitchen losartan (COZAAR) 100 MG tablet Take 100 mg by mouth daily after breakfast.     . metFORMIN (GLUCOPHAGE) 500 MG tablet Take 500 mg by mouth 2 (two) times daily with a meal.     . metoprolol (TOPROL-XL) 50 MG 24 hr tablet Take 50 mg by mouth daily after breakfast.     . Polyethyl Glycol-Propyl Glycol (SYSTANE ULTRA) 0.4-0.3 % SOLN Place 1 drop into both eyes daily.      . pravastatin (PRAVACHOL) 40 MG tablet Take 40 mg by mouth at bedtime.    . ranitidine (ZANTAC) 300 MG tablet Take 300 mg by mouth at bedtime.    . rivaroxaban (XARELTO) 20 MG TABS tablet Take 20 mg by mouth daily with supper.     No facility-administered medications prior to visit.     PAST MEDICAL HISTORY: Past Medical History:  Diagnosis Date  . Arthritis   . Chronic headache   . Complication of anesthesia    allergy to Novocaine   . Diabetes mellitus   . GERD (gastroesophageal reflux disease)   . Heart murmur   . High cholesterol   . Hyperlipidemia   . Hypertension   . Intracranial atherosclerosis    per MRA  . Migraine   . Narcolepsy and cataplexy   . OSA on CPAP    states has not used CPAP in over a year  . Rectal prolapse   . Trigger finger of all digits of right hand     PAST SURGICAL HISTORY: Past Surgical History:  Procedure Laterality Date  . ABDOMINAL HYSTERECTOMY    . BACK SURGERY     L4,L5 discectomy  . CARDIAC CATHETERIZATION    . CARPAL TUNNEL RELEASE Right 01/18/2017  . KIDNEY SURGERY  1998   Right.  growth removed  . rectal prolapse repair  12/05/11  . ROTATOR CUFF REPAIR     Left  . SHOULDER ARTHROSCOPY WITH SUBACROMIAL DECOMPRESSION, ROTATOR CUFF REPAIR AND BICEP TENDON REPAIR Left 05/25/2015   Procedure: SHOULDER ARTHROSCOPY WITH SUBACROMIAL DECOMPRESSION, ROTATOR CUFF REPAIR AND BICEP TENODESIS, DEBRIDEMENT. ;  Surgeon: Meredith Pel, MD;  Location: [place];  Service: Orthopedics;  Laterality: Left;  LEFT SHOULDER ROTATOR CUFF TEAR REPAIR, ARTHROSCOPY, DEBRIDEMENT, BICEPS  TENODESIS, SUBACROMIAL DECOMPRESSION.  . TOTAL ABDOMINAL HYSTERECTOMY    . TRIGGER FINGER RELEASE Right 04/30/2018   Procedure: RELEASE TRIGGER FINGER/A-1 PULLEY RIGHT MIDDLE  AND SMALLn ;  Surgeon: Daryll Brod, MD;  Location: [place];  Service: Orthopedics;  Laterality: Right;  FAB, Bier block  . TUBAL LIGATION      FAMILY HISTORY: Family History  Problem Relation Age of Onset  . Heart disease Mother   . Throat cancer Father   . Cancer Father        stomach  . Kidney cancer Brother   . Heart disease Brother     SOCIAL HISTORY: Social History   Socioeconomic History  . Marital status: Married    Spouse name: [name]  . Number of children: 4  . Years  of education: 14  . Highest education level: Not on file  Occupational History  . Occupation: retired. prev worked at [employer].  Social Needs  . Financial resource strain: Not on file  . Food insecurity    Worry: Not on file    Inability: Not on file  . Transportation needs    Medical: Not on file    Non-medical: Not on file  Tobacco Use  . Smoking status: Former Smoker    Quit date: 11/13/1980    Years since quitting: 38.9  . Smokeless tobacco: Never Used  . Tobacco comment: quit in 1982  Substance and Sexual Activity  . Alcohol use: No    Alcohol/week: 0.0 standard drinks  . Drug use: No  . Sexual activity: Not Currently  Lifestyle  . Physical activity    Days per week: Not on file    Minutes per session: Not on file  . Stress: Not on file  Relationships  . Social Herbalist on phone: Not on file    Gets together: Not on file    Attends religious service: Not on file    Active member of club or organization: Not on file    Attends meetings of clubs or organizations: Not on file    Relationship status: Not on file  . Intimate partner violence    Fear of current or ex partner: Not on file    Emotionally abused: Not on file    Physically abused: Not on file    Forced sexual  activity: Not on file  Other Topics Concern  . Not on file  Social History Narrative   21 st January 2014 , patient underwent PS and MSLT - MSLT had one  SREMs and an average time to fall asleep of  *.8 minutes , her Ewort is 20 points,: facit: this patient has severe hypersomnia and is at risk when driving. medication in form ogf nuvigil smaples had been dispensed to her but she ha not yet taken it. 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 In detail -discussion of diagnosis and treatment  takes place today , first with nuvigil and if insufficient, with XYREM. Her CPAP treats her OSA very well, and she uses it 6 hours  or more each night,  residual AHI of 1.1  would not allow for OSA to be still explaining this degree of sleepiness. 2 downloads were reviewed. labs reviewed.    Patient is married [name]) and lives at home with her husband and grandchild.   Patient has four children   Patient is retired.   Patient has a college education.   Patient is right-handed.   Patient does not drink any caffeine.      PHYSICAL EXAM  Vitals:   10/07/19 1103  BP: 124/68  Pulse: 71  Temp: (!) 97.4 F (36.3 C)  Weight: 124 lb 6.4 oz (56.4 kg)  Height: 4' 11"  (1.499 m)   Body mass index is 25.13 kg/m.  Generalized: Well developed, in no acute distress  MMSE - Mini Mental State Exam 10/07/2019 06/30/2019 03/29/2015  Orientation to time 5 5 5   Orientation to Place 4 5 5   Registration 2 3 3   Attention/ Calculation 1 3 5   Recall 0 1 1  Language- name 2 objects 2 2 2  Language- repeat 1 1 1   Language- follow 3 step command 3 3 3   Language- read & follow direction 1 1 1   Write a sentence 1 1 1   Copy design 1 1 1   Copy design-comments named 7 animals - -  Total score 21 26 28     Neurological examination  Mentation: Alert oriented to time,  place, history taking. Follows all commands speech and language fluent Cranial nerve II-XII: Pupils were equal round reactive to light. Extraocular movements were full, visual field were full on confrontational test. Facial sensation and strength were normal. Head turning and shoulder shrug  were normal and symmetric. Motor: The motor testing reveals 5 over 5 strength of all 4 extremities. Good symmetric motor tone is noted throughout.  Sensory: Sensory testing is intact to soft touch on all 4 extremities. No evidence of extinction is noted.  Coordination: Cerebellar testing reveals good finger-nose-finger and heel-to-shin bilaterally.  Gait and station: Gait is normal. Tandem gait is normal.  Reflexes: Deep tendon reflexes are symmetric and normal bilaterally.   DIAGNOSTIC DATA (LABS, IMAGING, TESTING) - I reviewed patient records, labs, notes, testing and imaging myself where available.  Lab Results  Component Value Date   WBC 6.0 06/03/2019   HGB 11.8 (L) 06/03/2019   HCT 36.1 06/03/2019   MCV 89.4 06/03/2019   PLT 269 06/03/2019      Component Value Date/Time   NA 140 06/03/2019 1725   K 4.2 06/03/2019 1725   CL 107 06/03/2019 1725   CO2 21 (L) 06/03/2019 1725   GLUCOSE 81 06/03/2019 1725   BUN 14 06/03/2019 1725   CREATININE 1.19 (H) 06/03/2019 1725   CALCIUM 9.9 06/03/2019 1725   PROT 7.1 01/30/2017 1336   ALBUMIN 3.5 01/30/2017 1336   AST 24 01/30/2017 1336   ALT 15 01/30/2017 1336   ALKPHOS 56 01/30/2017 1336   BILITOT 0.5 01/30/2017 1336   GFRNONAA 43 (L) 06/03/2019 1725   GFRAA 50 (L) 06/03/2019 1725   No results found for: CHOL, HDL, LDLCALC, LDLDIRECT, TRIG, CHOLHDL Lab Results  Component Value Date   HGBA1C 6.2 (H) 05/13/2015   No results found for: VITAMINB12 Lab Results  Component Value Date   TSH 1.220 06/30/2019    ASSESSMENT AND PLAN 78 y.o. year old female  has a past medical history of Arthritis, Chronic headache, Complication of anesthesia,  Diabetes mellitus, GERD (gastroesophageal reflux disease), Heart murmur, High cholesterol, Hyperlipidemia, Hypertension, Intracranial atherosclerosis, Migraine, Narcolepsy and cataplexy, OSA on CPAP, Rectal prolapse, and Trigger finger of all digits of right hand. here with:  1.  Left occipital area radiating pain 2.  Memory loss -She is quite well-appearing, seems to be a go-getter -MRI of the brain was unremarkable -ESR, CRP, TSH were normal, to rule out temporal arteritis -She will start Aricept 10 mg at bedtime for memory trouble -Heating pad, neck stretching exercise, massage, Tylenol for recurrent occipital area pain -She will follow-up in 6 months or sooner if needed  I spent 15 minutes with the patient. 50% of this time was spent discussing her plan of care.  Butler Denmark, AGNP-C, DNP 10/07/2019, 11:19 AM [facility] [address] [place], [place] [address] [phone]

## 2019-10-07 ENCOUNTER — Ambulatory Visit (INDEPENDENT_AMBULATORY_CARE_PROVIDER_SITE_OTHER): Payer: Medicare Other | Admitting: Neurology

## 2019-10-07 ENCOUNTER — Encounter: Payer: Self-pay | Admitting: Neurology

## 2019-10-07 ENCOUNTER — Other Ambulatory Visit: Payer: Self-pay

## 2019-10-07 VITALS — BP 124/68 | HR 71 | Temp 97.4°F | Ht 59.0 in | Wt 124.4 lb

## 2019-10-07 DIAGNOSIS — R413 Other amnesia: Secondary | ICD-10-CM

## 2019-10-07 MED ORDER — DONEPEZIL HCL 10 MG PO TABS: 10.0000 mg | ORAL_TABLET | Freq: Every day | ORAL | 1 refills | Status: DC

## 2019-10-07 NOTE — Patient Instructions (Signed)
You may start Aricept 10 mg at bedtime for memory trouble.  Donepezil tablets What is this medicine? DONEPEZIL (doe NEP e zil) is used to treat mild to moderate dementia caused by Alzheimer's disease. This medicine may be used for other purposes; ask your health care provider or pharmacist if you have questions. COMMON BRAND NAME(S): Aricept What should I tell my health care provider before I take this medicine? They need to know if you have any of these conditions:  asthma or other lung disease  difficulty passing urine  head injury  heart disease  history of irregular heartbeat  liver disease  seizures (convulsions)  stomach or intestinal disease, ulcers or stomach bleeding  an unusual or allergic reaction to donepezil, other medicines, foods, dyes, or preservatives  pregnant or trying to get pregnant  breast-feeding How should I use this medicine? Take this medicine by mouth with a glass of water. Follow the directions on the prescription label. You may take this medicine with or without food. Take this medicine at regular intervals. This medicine is usually taken before bedtime. Do not take it more often than directed. Continue to take your medicine even if you feel better. Do not stop taking except on your doctor's advice. If you are taking the 23 mg donepezil tablet, swallow it whole; do not cut, crush, or chew it. Talk to your pediatrician regarding the use of this medicine in children. Special care may be needed. Overdosage: If you think you have taken too much of this medicine contact a poison control center or emergency room at once. NOTE: This medicine is only for you. Do not share this medicine with others. What if I miss a dose? If you miss a dose, take it as soon as you can. If it is almost time for your next dose, take only that dose, do not take double or extra doses. What may interact with this medicine? Do not take this medicine with any of the following  medications:  certain medicines for fungal infections like itraconazole, fluconazole, posaconazole, and voriconazole  cisapride  dextromethorphan; quinidine  dronedarone  pimozide  quinidine  thioridazine This medicine may also interact with the following medications:  antihistamines for allergy, cough and cold  atropine  bethanechol  carbamazepine  certain medicines for bladder problems like oxybutynin, tolterodine  certain medicines for Parkinson's disease like benztropine, trihexyphenidyl  certain medicines for stomach problems like dicyclomine, hyoscyamine  certain medicines for travel sickness like scopolamine  dexamethasone  dofetilide  ipratropium  NSAIDs, medicines for pain and inflammation, like ibuprofen or naproxen  other medicines for Alzheimer's disease  other medicines that prolong the QT interval (cause an abnormal heart rhythm)  phenobarbital  phenytoin  rifampin, rifabutin or rifapentine  ziprasidone This list may not describe all possible interactions. Give your health care provider a list of all the medicines, herbs, non-prescription drugs, or dietary supplements you use. Also tell them if you smoke, drink alcohol, or use illegal drugs. Some items may interact with your medicine. What should I watch for while using this medicine? Visit your doctor or health care professional for regular checks on your progress. Check with your doctor or health care professional if your symptoms do not get better or if they get worse. You may get drowsy or dizzy. Do not drive, use machinery, or do anything that needs mental alertness until you know how this drug affects you. What side effects may I notice from receiving this medicine? Side effects that you should report  to your doctor or health care professional as soon as possible:  allergic reactions like skin rash, itching or hives, swelling of the face, lips, or tongue  feeling faint or lightheaded,  falls  loss of bladder control  seizures  signs and symptoms of a dangerous change in heartbeat or heart rhythm like chest pain; dizziness; fast or irregular heartbeat; palpitations; feeling faint or lightheaded, falls; breathing problems  signs and symptoms of infection like fever or chills; cough; sore throat; pain or trouble passing urine  signs and symptoms of liver injury like dark yellow or brown urine; general ill feeling or flu-like symptoms; light-colored stools; loss of appetite; nausea; right upper belly pain; unusually weak or tired; yellowing of the eyes or skin  slow heartbeat or palpitations  unusual bleeding or bruising  vomiting Side effects that usually do not require medical attention (report to your doctor or health care professional if they continue or are bothersome):  diarrhea, especially when starting treatment  headache  loss of appetite  muscle cramps  nausea  stomach upset This list may not describe all possible side effects. Call your doctor for medical advice about side effects. You may report side effects to FDA at 1-800-FDA-1088. Where should I keep my medicine? Keep out of reach of children. Store at room temperature between 15 and 30 degrees C (59 and 86 degrees F). Throw away any unused medicine after the expiration date. NOTE: This sheet is a summary. It may not cover all possible information. If you have questions about this medicine, talk to your doctor, pharmacist, or health care provider.  2020 [employer] (2018-10-21 10:33:41)

## 2019-10-24 DIAGNOSIS — D6862 Lupus anticoagulant syndrome: Secondary | ICD-10-CM | POA: Diagnosis not present

## 2019-10-24 DIAGNOSIS — M79604 Pain in right leg: Secondary | ICD-10-CM | POA: Diagnosis not present

## 2019-10-24 DIAGNOSIS — M79605 Pain in left leg: Secondary | ICD-10-CM | POA: Diagnosis not present

## 2019-10-24 DIAGNOSIS — I803 Phlebitis and thrombophlebitis of lower extremities, unspecified: Secondary | ICD-10-CM | POA: Diagnosis not present

## 2019-10-27 DIAGNOSIS — E1165 Type 2 diabetes mellitus with hyperglycemia: Secondary | ICD-10-CM | POA: Diagnosis not present

## 2019-10-27 DIAGNOSIS — E559 Vitamin D deficiency, unspecified: Secondary | ICD-10-CM | POA: Diagnosis not present

## 2019-10-27 DIAGNOSIS — M79604 Pain in right leg: Secondary | ICD-10-CM | POA: Diagnosis not present

## 2019-10-27 DIAGNOSIS — M129 Arthropathy, unspecified: Secondary | ICD-10-CM | POA: Diagnosis not present

## 2019-10-27 DIAGNOSIS — M79605 Pain in left leg: Secondary | ICD-10-CM | POA: Diagnosis not present

## 2019-10-27 DIAGNOSIS — E7800 Pure hypercholesterolemia, unspecified: Secondary | ICD-10-CM | POA: Diagnosis not present

## 2019-10-27 DIAGNOSIS — Z03818 Encounter for observation for suspected exposure to other biological agents ruled out: Secondary | ICD-10-CM | POA: Diagnosis not present

## 2019-11-26 DIAGNOSIS — M79606 Pain in leg, unspecified: Secondary | ICD-10-CM | POA: Diagnosis not present

## 2019-11-26 DIAGNOSIS — M129 Arthropathy, unspecified: Secondary | ICD-10-CM | POA: Diagnosis not present

## 2019-11-26 DIAGNOSIS — Z1159 Encounter for screening for other viral diseases: Secondary | ICD-10-CM | POA: Diagnosis not present

## 2019-11-26 DIAGNOSIS — Z79899 Other long term (current) drug therapy: Secondary | ICD-10-CM | POA: Diagnosis not present

## 2019-12-26 DIAGNOSIS — E1165 Type 2 diabetes mellitus with hyperglycemia: Secondary | ICD-10-CM | POA: Diagnosis not present

## 2019-12-26 DIAGNOSIS — M79601 Pain in right arm: Secondary | ICD-10-CM | POA: Diagnosis not present

## 2019-12-26 DIAGNOSIS — M79606 Pain in leg, unspecified: Secondary | ICD-10-CM | POA: Diagnosis not present

## 2019-12-26 DIAGNOSIS — I10 Essential (primary) hypertension: Secondary | ICD-10-CM | POA: Diagnosis not present

## 2019-12-26 DIAGNOSIS — E559 Vitamin D deficiency, unspecified: Secondary | ICD-10-CM | POA: Diagnosis not present

## 2019-12-26 DIAGNOSIS — Z79899 Other long term (current) drug therapy: Secondary | ICD-10-CM | POA: Diagnosis not present

## 2019-12-26 DIAGNOSIS — Z20822 Contact with and (suspected) exposure to covid-19: Secondary | ICD-10-CM | POA: Diagnosis not present

## 2020-01-14 DIAGNOSIS — M1811 Unilateral primary osteoarthritis of first carpometacarpal joint, right hand: Secondary | ICD-10-CM | POA: Diagnosis not present

## 2020-01-14 NOTE — Progress Notes (Signed)
I have reviewed and agreed above plan. 

## 2020-01-22 DIAGNOSIS — E119 Type 2 diabetes mellitus without complications: Secondary | ICD-10-CM | POA: Diagnosis not present

## 2020-01-22 DIAGNOSIS — M79606 Pain in leg, unspecified: Secondary | ICD-10-CM | POA: Diagnosis not present

## 2020-01-22 DIAGNOSIS — E559 Vitamin D deficiency, unspecified: Secondary | ICD-10-CM | POA: Diagnosis not present

## 2020-02-03 DIAGNOSIS — R3989 Other symptoms and signs involving the genitourinary system: Secondary | ICD-10-CM | POA: Diagnosis not present

## 2020-02-03 DIAGNOSIS — Z961 Presence of intraocular lens: Secondary | ICD-10-CM | POA: Diagnosis not present

## 2020-02-03 DIAGNOSIS — R300 Dysuria: Secondary | ICD-10-CM | POA: Diagnosis not present

## 2020-02-03 DIAGNOSIS — H26493 Other secondary cataract, bilateral: Secondary | ICD-10-CM | POA: Diagnosis not present

## 2020-02-03 DIAGNOSIS — R5383 Other fatigue: Secondary | ICD-10-CM | POA: Diagnosis not present

## 2020-02-03 DIAGNOSIS — Z79899 Other long term (current) drug therapy: Secondary | ICD-10-CM | POA: Diagnosis not present

## 2020-02-03 DIAGNOSIS — E559 Vitamin D deficiency, unspecified: Secondary | ICD-10-CM | POA: Diagnosis not present

## 2020-02-03 DIAGNOSIS — D539 Nutritional anemia, unspecified: Secondary | ICD-10-CM | POA: Diagnosis not present

## 2020-02-03 DIAGNOSIS — E119 Type 2 diabetes mellitus without complications: Secondary | ICD-10-CM | POA: Diagnosis not present

## 2020-02-09 DIAGNOSIS — R945 Abnormal results of liver function studies: Secondary | ICD-10-CM | POA: Diagnosis not present

## 2020-02-09 DIAGNOSIS — E1165 Type 2 diabetes mellitus with hyperglycemia: Secondary | ICD-10-CM | POA: Diagnosis not present

## 2020-02-09 DIAGNOSIS — R3989 Other symptoms and signs involving the genitourinary system: Secondary | ICD-10-CM | POA: Diagnosis not present

## 2020-02-10 DIAGNOSIS — R5383 Other fatigue: Secondary | ICD-10-CM | POA: Diagnosis not present

## 2020-02-10 DIAGNOSIS — I517 Cardiomegaly: Secondary | ICD-10-CM | POA: Diagnosis not present

## 2020-02-19 DIAGNOSIS — D689 Coagulation defect, unspecified: Secondary | ICD-10-CM | POA: Diagnosis not present

## 2020-02-19 DIAGNOSIS — R5383 Other fatigue: Secondary | ICD-10-CM | POA: Diagnosis not present

## 2020-02-19 DIAGNOSIS — N183 Chronic kidney disease, stage 3 unspecified: Secondary | ICD-10-CM | POA: Diagnosis not present

## 2020-02-19 DIAGNOSIS — E119 Type 2 diabetes mellitus without complications: Secondary | ICD-10-CM | POA: Diagnosis not present

## 2020-02-26 DIAGNOSIS — R945 Abnormal results of liver function studies: Secondary | ICD-10-CM | POA: Diagnosis not present

## 2020-03-23 DIAGNOSIS — I10 Essential (primary) hypertension: Secondary | ICD-10-CM | POA: Diagnosis not present

## 2020-03-23 DIAGNOSIS — K591 Functional diarrhea: Secondary | ICD-10-CM | POA: Diagnosis not present

## 2020-03-23 DIAGNOSIS — M79606 Pain in leg, unspecified: Secondary | ICD-10-CM | POA: Diagnosis not present

## 2020-04-05 ENCOUNTER — Other Ambulatory Visit: Payer: Self-pay

## 2020-04-05 ENCOUNTER — Encounter: Payer: Self-pay | Admitting: Neurology

## 2020-04-05 ENCOUNTER — Ambulatory Visit (INDEPENDENT_AMBULATORY_CARE_PROVIDER_SITE_OTHER): Payer: Medicare Other | Admitting: Neurology

## 2020-04-05 VITALS — BP 180/78 | HR 59 | Ht 59.0 in | Wt 121.0 lb

## 2020-04-05 DIAGNOSIS — R413 Other amnesia: Secondary | ICD-10-CM

## 2020-04-05 DIAGNOSIS — H0288A Meibomian gland dysfunction right eye, upper and lower eyelids: Secondary | ICD-10-CM | POA: Diagnosis not present

## 2020-04-05 DIAGNOSIS — H1045 Other chronic allergic conjunctivitis: Secondary | ICD-10-CM | POA: Diagnosis not present

## 2020-04-05 DIAGNOSIS — H0288B Meibomian gland dysfunction left eye, upper and lower eyelids: Secondary | ICD-10-CM | POA: Diagnosis not present

## 2020-04-05 MED ORDER — DONEPEZIL HCL 10 MG PO TABS: 10.0000 mg | ORAL_TABLET | Freq: Every day | ORAL | 1 refills | Status: DC

## 2020-04-05 NOTE — Patient Instructions (Addendum)
Start Aricept 10 mg at bedtime for memory  Check BP at home, if continues to be elevated call PCP See you back in 6 months  Encourage you to continue exercise and activity   Donepezil tablets What is this medicine? DONEPEZIL (doe NEP e zil) is used to treat mild to moderate dementia caused by Alzheimer's disease. This medicine may be used for other purposes; ask your health care provider or pharmacist if you have questions. COMMON BRAND NAME(S): Aricept What should I tell my health care provider before I take this medicine? They need to know if you have any of these conditions:  asthma or other lung disease  difficulty passing urine  head injury  heart disease  history of irregular heartbeat  liver disease  seizures (convulsions)  stomach or intestinal disease, ulcers or stomach bleeding  an unusual or allergic reaction to donepezil, other medicines, foods, dyes, or preservatives  pregnant or trying to get pregnant  breast-feeding How should I use this medicine? Take this medicine by mouth with a glass of water. Follow the directions on the prescription label. You may take this medicine with or without food. Take this medicine at regular intervals. This medicine is usually taken before bedtime. Do not take it more often than directed. Continue to take your medicine even if you feel better. Do not stop taking except on your doctor's advice. If you are taking the 23 mg donepezil tablet, swallow it whole; do not cut, crush, or chew it. Talk to your pediatrician regarding the use of this medicine in children. Special care may be needed. Overdosage: If you think you have taken too much of this medicine contact a poison control center or emergency room at once. NOTE: This medicine is only for you. Do not share this medicine with others. What if I miss a dose? If you miss a dose, take it as soon as you can. If it is almost time for your next dose, take only that dose, do not take  double or extra doses. What may interact with this medicine? Do not take this medicine with any of the following medications:  certain medicines for fungal infections like itraconazole, fluconazole, posaconazole, and voriconazole  cisapride  dextromethorphan; quinidine  dronedarone  pimozide  quinidine  thioridazine This medicine may also interact with the following medications:  antihistamines for allergy, cough and cold  atropine  bethanechol  carbamazepine  certain medicines for bladder problems like oxybutynin, tolterodine  certain medicines for Parkinson's disease like benztropine, trihexyphenidyl  certain medicines for stomach problems like dicyclomine, hyoscyamine  certain medicines for travel sickness like scopolamine  dexamethasone  dofetilide  ipratropium  NSAIDs, medicines for pain and inflammation, like ibuprofen or naproxen  other medicines for Alzheimer's disease  other medicines that prolong the QT interval (cause an abnormal heart rhythm)  phenobarbital  phenytoin  rifampin, rifabutin or rifapentine  ziprasidone This list may not describe all possible interactions. Give your health care provider a list of all the medicines, herbs, non-prescription drugs, or dietary supplements you use. Also tell them if you smoke, drink alcohol, or use illegal drugs. Some items may interact with your medicine. What should I watch for while using this medicine? Visit your doctor or health care professional for regular checks on your progress. Check with your doctor or health care professional if your symptoms do not get better or if they get worse. You may get drowsy or dizzy. Do not drive, use machinery, or do anything that needs mental alertness  until you know how this drug affects you. What side effects may I notice from receiving this medicine? Side effects that you should report to your doctor or health care professional as soon as possible:  allergic  reactions like skin rash, itching or hives, swelling of the face, lips, or tongue  feeling faint or lightheaded, falls  loss of bladder control  seizures  signs and symptoms of a dangerous change in heartbeat or heart rhythm like chest pain; dizziness; fast or irregular heartbeat; palpitations; feeling faint or lightheaded, falls; breathing problems  signs and symptoms of infection like fever or chills; cough; sore throat; pain or trouble passing urine  signs and symptoms of liver injury like dark yellow or brown urine; general ill feeling or flu-like symptoms; light-colored stools; loss of appetite; nausea; right upper belly pain; unusually weak or tired; yellowing of the eyes or skin  slow heartbeat or palpitations  unusual bleeding or bruising  vomiting Side effects that usually do not require medical attention (report to your doctor or health care professional if they continue or are bothersome):  diarrhea, especially when starting treatment  headache  loss of appetite  muscle cramps  nausea  stomach upset This list may not describe all possible side effects. Call your doctor for medical advice about side effects. You may report side effects to FDA at 1-800-FDA-1088. Where should I keep my medicine? Keep out of reach of children. Store at room temperature between 15 and 30 degrees C (59 and 86 degrees F). Throw away any unused medicine after the expiration date. NOTE: This sheet is a summary. It may not cover all possible information. If you have questions about this medicine, talk to your doctor, pharmacist, or health care provider.  2020 [employer] Standard (2018-10-21 10:33:41)

## 2020-04-05 NOTE — Progress Notes (Signed)
PATIENT: Stephanie Barber DOB: 11/21/1940  REASON FOR VISIT: follow up HISTORY FROM: patient  HISTORY OF PRESENT ILLNESS: Today 04/05/20  HISTORY [name] a 79 year old female, referred by her primary care physician Dr. Kellie Shropshire for evaluation of headache, initial evaluation was on June 30, 2019.  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 Brett Fairy a few times, most recent visit was Dec 2015  She also underwent PS and MSLT - MSLT had one SREMs and an average time to fall asleep of 8 minutes , her Epworth is 20 points this patient has severe hypersomnia and is at risk when driving. She is now taking nuvigil '250mg'$  qam.  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 .She lost to follow-up since 2016  Today she complains of recurrent transient left occipital area sharp radiating pain, started in July 2020, one severe episode last about couple minutes, she had a few smaller recurrent episode, she denies visual loss, no body achy pain  She is no longer using CPAP machine anymore, sleeps well at nighttime, occasionally  daytime sleepiness, before COVID-19, she has her own catering business,she now noticed occasionally word finding difficulty, short-term memory loss  Update October 07, 1355 SS: 79 year old female with history of memory loss and left occipital area radiating pain.  MRI of the brain was unremarkable 07/29/2019.  Laboratory evaluation, CRP, sed rate, TSH were normal.  She indicates that the occipital pain has subsided.  It may come in brief spurts, is tolerable, and does not last long or frequently.  She indicates her memory is stable, continued difficulty with short-term memory.  She thinks she has become aware of this, because she has been stuck in the house during the pandemic.  She sleeps well at night, no longer uses CPAP. She drives, and cooks a lot for other people.  She remains quite active.  She presents today for follow-up unaccompanied.   Update Apr 05, 2020 [name], [name], unclear why?  Indicates has been doing well, memory remains stable, continues have difficulty with names.  She manages her medications, come in the mail from [place]. Had the Covid vaccine, 1 month later, woke up with several friends and orthopedic joints.  No longer uses CPAP, does not feel she needs it, feels rested during the day.  Is no longer doing her cooking/catering business.  She lives with her husband, does her own ADLs, housework, drives.  She remains active.  She no longer complains of the left occipital pain. BP elevated, manual 180/80, did not take blood pressure medications.  REVIEW OF SYSTEMS: Out of a complete 14 system review of symptoms, the patient complains only of the following symptoms, and  all other reviewed systems are negative.  Memory loss  ALLERGIES: Allergies  Allergen Reactions  . Morphine And Related Anaphylaxis    HOME MEDICATIONS: Outpatient Medications Prior to Visit  Medication Sig Dispense Refill  . acetaminophen-codeine  (TYLENOL #3) 300-30 MG tablet Take 1 tablet by mouth every 6 (six) hours as needed (pain).   0  . amLODipine (NORVASC) 10 MG tablet Take 10 mg by mouth daily after breakfast.     . aspirin EC 81 MG tablet Take 81 mg by mouth at bedtime.    . celecoxib (CELEBREX) 100 MG capsule Take 100 mg by mouth daily.    . cetirizine (ZYRTEC) 10 MG tablet Take 10 mg by mouth daily.    . Cinnamon 500 MG capsule Take 500 mg by mouth daily.    . Ferrous Sulfate (IRON PO) Take 1 tablet by mouth daily.    . fish oil-omega-3 fatty acids 1000 MG capsule Take 1 g by mouth daily.      Marland Kitchen gabapentin (NEURONTIN) 400 MG capsule Take 400 mg by mouth at bedtime.     Marland Kitchen losartan (COZAAR) 100 MG tablet Take 100 mg by mouth daily after breakfast.     . metFORMIN (GLUCOPHAGE) 500 MG tablet Take 500 mg by mouth 2 (two) times daily with a meal.     . pravastatin (PRAVACHOL) 40 MG tablet Take 40 mg by mouth at bedtime.    . ranitidine (ZANTAC) 300 MG tablet Take 300 mg by mouth at bedtime.    . rivaroxaban (XARELTO) 20 MG TABS tablet Take 20 mg by mouth daily with supper.    . budesonide-formoterol (SYMBICORT) 160-4.5 MCG/ACT inhaler Inhale 2 puffs into the lungs 2 (two) times daily.    Marland Kitchen donepezil (ARICEPT) 10 MG tablet Take 1 tablet (10 mg total) by mouth at bedtime. 90 tablet 1  . metoprolol (TOPROL-XL) 50 MG 24 hr tablet Take 50 mg by mouth daily after breakfast.     . Polyethyl Glycol-Propyl Glycol (SYSTANE ULTRA) 0.4-0.3 % SOLN Place 1 drop into both eyes daily.       No facility-administered medications prior to visit.    PAST MEDICAL HISTORY: Past Medical History:  Diagnosis Date  . Arthritis   . Chronic headache   . Complication of anesthesia    allergy to Novocaine   . Diabetes mellitus   . GERD (gastroesophageal reflux disease)   . Heart murmur   . High cholesterol   . Hyperlipidemia   . Hypertension   . Intracranial atherosclerosis    per MRA  . Migraine   . Narcolepsy and cataplexy   . OSA on CPAP     states has not used CPAP in over a year  . Rectal prolapse   . Trigger finger of all digits of right hand     PAST SURGICAL HISTORY: Past Surgical History:  Procedure Laterality Date  . ABDOMINAL HYSTERECTOMY    . BACK SURGERY     L4,L5 discectomy  . CARDIAC CATHETERIZATION    . CARPAL TUNNEL RELEASE Right 01/18/2017  . KIDNEY SURGERY  1998   Right.  growth removed  . rectal prolapse repair  12/05/11  . ROTATOR CUFF REPAIR     Left  . SHOULDER ARTHROSCOPY WITH SUBACROMIAL DECOMPRESSION, ROTATOR CUFF REPAIR AND BICEP TENDON REPAIR Left 05/25/2015   Procedure: SHOULDER ARTHROSCOPY WITH SUBACROMIAL DECOMPRESSION, ROTATOR CUFF REPAIR AND BICEP TENODESIS, DEBRIDEMENT. ;  Surgeon: Meredith Pel, MD;  Location: [facility];  Service: Orthopedics;  Laterality:  Left;  LEFT SHOULDER ROTATOR CUFF TEAR REPAIR, ARTHROSCOPY, DEBRIDEMENT, BICEPS TENODESIS, SUBACROMIAL DECOMPRESSION.  . TOTAL ABDOMINAL HYSTERECTOMY    . TRIGGER FINGER RELEASE Right 04/30/2018   Procedure: RELEASE TRIGGER FINGER/A-1 PULLEY RIGHT MIDDLE  AND SMALLn ;  Surgeon: Daryll Brod, MD;  Location: [facility];  Service: Orthopedics;  Laterality: Right;  FAB, Bier block  . TUBAL LIGATION      FAMILY HISTORY: Family History  Problem Relation Age of Onset  . Heart disease Mother   . Throat cancer Father   . Cancer Father        stomach  . Kidney cancer Brother   . Heart disease Brother     SOCIAL HISTORY: Social History   Socioeconomic History  . Marital status: Married    Spouse name: [name]  . Number of children: 4  . Years of education: 58  . Highest education level: Not on file  Occupational History  . Occupation: retired. prev worked at [employer].  Tobacco Use  . Smoking status: Former Smoker    Quit date: 11/13/1980    Years since quitting: 39.4  . Smokeless tobacco: Never Used  . Tobacco comment: quit in 1982  Substance and Sexual Activity  . Alcohol use: No    Alcohol/week: 0.0  standard drinks  . Drug use: No  . Sexual activity: Not Currently  Other Topics Concern  . Not on file  Social History Narrative   21 st January 2014 , patient underwent PS and MSLT - MSLT had one  SREMs and an average time to fall asleep of  *.8 minutes , her Ewort is 20 points,: facit: this patient has severe hypersomnia and is at risk when driving. medication in form ogf nuvigil smaples had been dispensed to her but she ha not yet taken it. 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 In detail -discussion of diagnosis and treatment  takes place today , first with nuvigil and if insufficient, with XYREM. Her CPAP treats her OSA very well, and she uses it 6 hours  or more each night,  residual AHI of 1.1  would not allow for OSA to be still explaining this degree of sleepiness. 2 downloads were reviewed. labs reviewed.    Patient is married [name]) and lives at home with her husband and grandchild.   Patient has four children   Patient is retired.   Patient has a college education.   Patient is right-handed.   Patient does not drink any caffeine.      Social Determinants of Health   Financial Resource Strain:   . Difficulty of Paying Living Expenses:   Food Insecurity:   . Worried About Charity fundraiser in the Last Year:   . Arboriculturist in the Last Year:   Transportation Needs:   . Film/video editor (Medical):   Marland Kitchen Lack of Transportation (Non-Medical):   Physical Activity:   . Days of Exercise per Week:   . Minutes of Exercise per Session:   Stress:   . Feeling of Stress :   Social Connections:   . Frequency of Communication with Friends and Family:   . Frequency of Social Gatherings with Friends and Family:   . Attends Religious Services:   . Active Member of Clubs or Organizations:   .  Attends Archivist  Meetings:   Marland Kitchen Marital Status:   Intimate Partner Violence:   . Fear of Current or Ex-Partner:   . Emotionally Abused:   Marland Kitchen Physically Abused:   . Sexually Abused:    PHYSICAL EXAM  Vitals:   04/05/20 0948  BP: (!) 180/78  Pulse: (!) 59  Weight: 121 lb (54.9 kg)  Height: 4' 11"  (1.499 m)   Body mass index is 24.44 kg/m.  Generalized: Well developed, in no acute distress  MMSE - Mini Mental State Exam 04/05/2020 10/07/2019 06/30/2019  Orientation to time 4 5 5   Orientation to Place 5 4 5   Registration 3 2 3   Attention/ Calculation 1 1 3   Recall 0 0 1  Language- name 2 objects 2 2 2   Language- repeat 1 1 1   Language- follow 3 step command 3 3 3   Language- read & follow direction 1 1 1   Write a sentence 1 1 1   Copy design 1 1 1   Copy design-comments 5 animals named 7 animals -  Total score 22 21 26     Neurological examination  Mentation: Alert oriented to time, place, history taking. Follows all commands speech and language fluent Cranial nerve II-XII: Pupils were equal round reactive to light. Extraocular movements were full, visual field were full on confrontational test. Facial sensation and strength were normal.  Head turning and shoulder shrug  were normal and symmetric. Motor: The motor testing reveals 5 over 5 strength of all 4 extremities. Good symmetric motor tone is noted throughout.  Sensory: Sensory testing is intact to soft touch on all 4 extremities. No evidence of extinction is noted.  Coordination: Cerebellar testing reveals good finger-nose-finger and heel-to-shin bilaterally.  Gait and station: Gait is normal. Tandem gait is normal. Reflexes: Deep tendon reflexes are symmetric and normal bilaterally.   DIAGNOSTIC DATA (LABS, IMAGING, TESTING) - I reviewed patient records, labs, notes, testing and imaging myself where available.  Lab Results  Component Value Date   WBC 6.0 06/03/2019   HGB 11.8 (L) 06/03/2019   HCT 36.1 06/03/2019   MCV 89.4 06/03/2019     PLT 269 06/03/2019      Component Value Date/Time   NA 140 06/03/2019 1725   K 4.2 06/03/2019 1725   CL 107 06/03/2019 1725   CO2 21 (L) 06/03/2019 1725   GLUCOSE 81 06/03/2019 1725   BUN 14 06/03/2019 1725   CREATININE 1.19 (H) 06/03/2019 1725   CALCIUM 9.9 06/03/2019 1725   PROT 7.1 01/30/2017 1336   ALBUMIN 3.5 01/30/2017 1336   AST 24 01/30/2017 1336   ALT 15 01/30/2017 1336   ALKPHOS 56 01/30/2017 1336   BILITOT 0.5 01/30/2017 1336   GFRNONAA 43 (L) 06/03/2019 1725   GFRAA 50 (L) 06/03/2019 1725   No results found for: CHOL, HDL, LDLCALC, LDLDIRECT, TRIG, CHOLHDL Lab Results  Component Value Date   HGBA1C 6.2 (H) 05/13/2015   No results found for: VITAMINB12 Lab Results  Component Value Date   TSH 1.220 06/30/2019      ASSESSMENT AND PLAN 79 y.o. year old female  has a past medical history of Arthritis, Chronic headache, Complication of anesthesia, Diabetes mellitus, GERD (gastroesophageal reflux disease), Heart murmur, High cholesterol, Hyperlipidemia, Hypertension, Intracranial atherosclerosis, Migraine, Narcolepsy and cataplexy, OSA on CPAP, Rectal prolapse, and Trigger finger of all digits of right hand. here with:  1.  Left occipital area radiating pain -ESR, CRP, TSH were normal, rule out temporal arteritis -Indicates no longer an  issue  2.  Memory loss -MMSE stable, 22/30 -MRI of the brain was unremarkable -Will reorder Aricept 10 mg at bedtime, was done last time, she did not start, unclear why?  Discussed side effects -No longer using CPAP, says sleeping well, feeling good during the day -Encouraged continued exercise, activity -Follow-up in 6 months or sooner if needed  I spent 30 minutes of face-to-face and non-face-to-face time with patient.  This included previsit chart review, lab review, study review, order entry, electronic health record documentation, patient education.  Butler Denmark, AGNP-C, DNP 04/05/2020, 10:10 AM [facility] [address], [place], [place] [address] [phone]

## 2020-04-13 DIAGNOSIS — E1165 Type 2 diabetes mellitus with hyperglycemia: Secondary | ICD-10-CM | POA: Diagnosis not present

## 2020-04-13 DIAGNOSIS — R0602 Shortness of breath: Secondary | ICD-10-CM | POA: Diagnosis not present

## 2020-04-13 DIAGNOSIS — E7800 Pure hypercholesterolemia, unspecified: Secondary | ICD-10-CM | POA: Diagnosis not present

## 2020-04-13 DIAGNOSIS — Z1159 Encounter for screening for other viral diseases: Secondary | ICD-10-CM | POA: Diagnosis not present

## 2020-04-13 DIAGNOSIS — Z79899 Other long term (current) drug therapy: Secondary | ICD-10-CM | POA: Diagnosis not present

## 2020-04-13 DIAGNOSIS — Z0000 Encounter for general adult medical examination without abnormal findings: Secondary | ICD-10-CM | POA: Diagnosis not present

## 2020-04-13 DIAGNOSIS — R5383 Other fatigue: Secondary | ICD-10-CM | POA: Diagnosis not present

## 2020-04-13 DIAGNOSIS — E559 Vitamin D deficiency, unspecified: Secondary | ICD-10-CM | POA: Diagnosis not present

## 2020-04-26 DIAGNOSIS — M79606 Pain in leg, unspecified: Secondary | ICD-10-CM | POA: Diagnosis not present

## 2020-04-26 DIAGNOSIS — R945 Abnormal results of liver function studies: Secondary | ICD-10-CM | POA: Diagnosis not present

## 2020-04-26 DIAGNOSIS — M1380 Other specified arthritis, unspecified site: Secondary | ICD-10-CM | POA: Diagnosis not present

## 2020-04-26 DIAGNOSIS — Z79899 Other long term (current) drug therapy: Secondary | ICD-10-CM | POA: Diagnosis not present

## 2020-05-07 DIAGNOSIS — R942 Abnormal results of pulmonary function studies: Secondary | ICD-10-CM | POA: Diagnosis not present

## 2020-05-26 DIAGNOSIS — R5383 Other fatigue: Secondary | ICD-10-CM | POA: Diagnosis not present

## 2020-06-03 DIAGNOSIS — M2011 Hallux valgus (acquired), right foot: Secondary | ICD-10-CM | POA: Diagnosis not present

## 2020-06-03 DIAGNOSIS — E119 Type 2 diabetes mellitus without complications: Secondary | ICD-10-CM | POA: Diagnosis not present

## 2020-06-03 DIAGNOSIS — M2012 Hallux valgus (acquired), left foot: Secondary | ICD-10-CM | POA: Diagnosis not present

## 2020-06-05 DIAGNOSIS — K921 Melena: Secondary | ICD-10-CM | POA: Diagnosis not present

## 2020-06-11 DIAGNOSIS — I10 Essential (primary) hypertension: Secondary | ICD-10-CM | POA: Diagnosis not present

## 2020-06-11 DIAGNOSIS — R5383 Other fatigue: Secondary | ICD-10-CM | POA: Diagnosis not present

## 2020-06-25 DIAGNOSIS — K921 Melena: Secondary | ICD-10-CM | POA: Diagnosis not present

## 2020-06-30 DIAGNOSIS — R109 Unspecified abdominal pain: Secondary | ICD-10-CM | POA: Diagnosis not present

## 2020-06-30 DIAGNOSIS — M25551 Pain in right hip: Secondary | ICD-10-CM | POA: Diagnosis not present

## 2020-07-01 DIAGNOSIS — E7849 Other hyperlipidemia: Secondary | ICD-10-CM | POA: Diagnosis not present

## 2020-07-01 DIAGNOSIS — M25551 Pain in right hip: Secondary | ICD-10-CM | POA: Diagnosis not present

## 2020-07-01 DIAGNOSIS — I10 Essential (primary) hypertension: Secondary | ICD-10-CM | POA: Diagnosis not present

## 2020-07-01 DIAGNOSIS — M25511 Pain in right shoulder: Secondary | ICD-10-CM | POA: Diagnosis not present

## 2020-07-01 DIAGNOSIS — Z8249 Family history of ischemic heart disease and other diseases of the circulatory system: Secondary | ICD-10-CM | POA: Diagnosis not present

## 2020-07-08 DIAGNOSIS — Z8249 Family history of ischemic heart disease and other diseases of the circulatory system: Secondary | ICD-10-CM | POA: Diagnosis not present

## 2020-07-08 DIAGNOSIS — E7849 Other hyperlipidemia: Secondary | ICD-10-CM | POA: Diagnosis not present

## 2020-07-08 DIAGNOSIS — I10 Essential (primary) hypertension: Secondary | ICD-10-CM | POA: Diagnosis not present

## 2020-07-13 DIAGNOSIS — R1032 Left lower quadrant pain: Secondary | ICD-10-CM | POA: Diagnosis not present

## 2020-07-13 DIAGNOSIS — K5792 Diverticulitis of intestine, part unspecified, without perforation or abscess without bleeding: Secondary | ICD-10-CM | POA: Diagnosis not present

## 2020-07-21 DIAGNOSIS — R159 Full incontinence of feces: Secondary | ICD-10-CM | POA: Diagnosis not present

## 2020-07-21 DIAGNOSIS — K921 Melena: Secondary | ICD-10-CM | POA: Diagnosis not present

## 2020-07-21 DIAGNOSIS — R197 Diarrhea, unspecified: Secondary | ICD-10-CM | POA: Diagnosis not present

## 2020-07-22 DIAGNOSIS — R252 Cramp and spasm: Secondary | ICD-10-CM | POA: Diagnosis not present

## 2020-07-22 DIAGNOSIS — Z79899 Other long term (current) drug therapy: Secondary | ICD-10-CM | POA: Diagnosis not present

## 2020-07-22 DIAGNOSIS — R109 Unspecified abdominal pain: Secondary | ICD-10-CM | POA: Diagnosis not present

## 2020-07-22 DIAGNOSIS — M25551 Pain in right hip: Secondary | ICD-10-CM | POA: Diagnosis not present

## 2020-07-27 DIAGNOSIS — R1084 Generalized abdominal pain: Secondary | ICD-10-CM | POA: Diagnosis not present

## 2020-07-27 DIAGNOSIS — R109 Unspecified abdominal pain: Secondary | ICD-10-CM | POA: Diagnosis not present

## 2020-07-27 DIAGNOSIS — R197 Diarrhea, unspecified: Secondary | ICD-10-CM | POA: Diagnosis not present

## 2020-07-28 DIAGNOSIS — R109 Unspecified abdominal pain: Secondary | ICD-10-CM | POA: Diagnosis not present

## 2020-07-28 DIAGNOSIS — R252 Cramp and spasm: Secondary | ICD-10-CM | POA: Diagnosis not present

## 2020-07-28 DIAGNOSIS — Z79899 Other long term (current) drug therapy: Secondary | ICD-10-CM | POA: Diagnosis not present

## 2020-07-28 DIAGNOSIS — E1165 Type 2 diabetes mellitus with hyperglycemia: Secondary | ICD-10-CM | POA: Diagnosis not present

## 2020-07-28 DIAGNOSIS — M25551 Pain in right hip: Secondary | ICD-10-CM | POA: Diagnosis not present

## 2020-08-04 DIAGNOSIS — R159 Full incontinence of feces: Secondary | ICD-10-CM | POA: Diagnosis not present

## 2020-08-06 DIAGNOSIS — E119 Type 2 diabetes mellitus without complications: Secondary | ICD-10-CM | POA: Diagnosis not present

## 2020-08-06 DIAGNOSIS — R159 Full incontinence of feces: Secondary | ICD-10-CM | POA: Diagnosis not present

## 2020-08-06 DIAGNOSIS — Z1211 Encounter for screening for malignant neoplasm of colon: Secondary | ICD-10-CM | POA: Diagnosis not present

## 2020-08-06 DIAGNOSIS — Z01818 Encounter for other preprocedural examination: Secondary | ICD-10-CM | POA: Diagnosis not present

## 2020-08-10 DIAGNOSIS — M169 Osteoarthritis of hip, unspecified: Secondary | ICD-10-CM | POA: Diagnosis not present

## 2020-08-11 DIAGNOSIS — R159 Full incontinence of feces: Secondary | ICD-10-CM | POA: Diagnosis not present

## 2020-08-11 DIAGNOSIS — R197 Diarrhea, unspecified: Secondary | ICD-10-CM | POA: Diagnosis not present

## 2020-08-25 DIAGNOSIS — R252 Cramp and spasm: Secondary | ICD-10-CM | POA: Diagnosis not present

## 2020-08-25 DIAGNOSIS — Z79899 Other long term (current) drug therapy: Secondary | ICD-10-CM | POA: Diagnosis not present

## 2020-08-25 DIAGNOSIS — M79606 Pain in leg, unspecified: Secondary | ICD-10-CM | POA: Diagnosis not present

## 2020-09-24 DIAGNOSIS — L52 Erythema nodosum: Secondary | ICD-10-CM | POA: Diagnosis not present

## 2020-09-24 DIAGNOSIS — R300 Dysuria: Secondary | ICD-10-CM | POA: Diagnosis not present

## 2020-09-24 DIAGNOSIS — E1165 Type 2 diabetes mellitus with hyperglycemia: Secondary | ICD-10-CM | POA: Diagnosis not present

## 2020-09-24 DIAGNOSIS — Z79899 Other long term (current) drug therapy: Secondary | ICD-10-CM | POA: Diagnosis not present

## 2020-09-24 DIAGNOSIS — M129 Arthropathy, unspecified: Secondary | ICD-10-CM | POA: Diagnosis not present

## 2020-09-24 DIAGNOSIS — E559 Vitamin D deficiency, unspecified: Secondary | ICD-10-CM | POA: Diagnosis not present

## 2020-09-24 DIAGNOSIS — Z20822 Contact with and (suspected) exposure to covid-19: Secondary | ICD-10-CM | POA: Diagnosis not present

## 2020-09-24 DIAGNOSIS — M79606 Pain in leg, unspecified: Secondary | ICD-10-CM | POA: Diagnosis not present

## 2020-09-24 DIAGNOSIS — E039 Hypothyroidism, unspecified: Secondary | ICD-10-CM | POA: Diagnosis not present

## 2020-09-24 DIAGNOSIS — D539 Nutritional anemia, unspecified: Secondary | ICD-10-CM | POA: Diagnosis not present

## 2020-09-28 DIAGNOSIS — L52 Erythema nodosum: Secondary | ICD-10-CM | POA: Diagnosis not present

## 2020-09-28 DIAGNOSIS — D863 Sarcoidosis of skin: Secondary | ICD-10-CM | POA: Diagnosis not present

## 2020-09-29 DIAGNOSIS — R159 Full incontinence of feces: Secondary | ICD-10-CM | POA: Diagnosis not present

## 2020-09-29 DIAGNOSIS — K591 Functional diarrhea: Secondary | ICD-10-CM | POA: Diagnosis not present

## 2020-10-06 ENCOUNTER — Ambulatory Visit: Payer: Medicare Other | Admitting: Neurology

## 2020-10-13 DIAGNOSIS — D863 Sarcoidosis of skin: Secondary | ICD-10-CM | POA: Diagnosis not present

## 2020-10-13 DIAGNOSIS — L52 Erythema nodosum: Secondary | ICD-10-CM | POA: Diagnosis not present

## 2020-10-22 DIAGNOSIS — R4189 Other symptoms and signs involving cognitive functions and awareness: Secondary | ICD-10-CM | POA: Diagnosis not present

## 2020-10-22 DIAGNOSIS — L52 Erythema nodosum: Secondary | ICD-10-CM | POA: Diagnosis not present

## 2020-10-22 DIAGNOSIS — M79606 Pain in leg, unspecified: Secondary | ICD-10-CM | POA: Diagnosis not present

## 2020-10-22 DIAGNOSIS — Z79899 Other long term (current) drug therapy: Secondary | ICD-10-CM | POA: Diagnosis not present

## 2021-01-03 ENCOUNTER — Encounter: Payer: Self-pay | Admitting: Neurology

## 2021-01-03 ENCOUNTER — Ambulatory Visit (INDEPENDENT_AMBULATORY_CARE_PROVIDER_SITE_OTHER): Payer: Medicare Other | Admitting: Neurology

## 2021-01-03 VITALS — BP 165/69 | HR 58 | Ht 59.0 in | Wt 112.0 lb

## 2021-01-03 DIAGNOSIS — R413 Other amnesia: Secondary | ICD-10-CM | POA: Diagnosis not present

## 2021-01-03 MED ORDER — DONEPEZIL HCL 10 MG PO TABS: 10.0000 mg | ORAL_TABLET | Freq: Every day | ORAL | 1 refills | Status: DC

## 2021-01-03 NOTE — Progress Notes (Signed)
PATIENT: Stephanie Barber DOB: 1941-08-14  REASON FOR VISIT: follow up HISTORY FROM: patient  HISTORY OF PRESENT ILLNESS: Today 01/03/21  HISTORY  [name] a 80 year old female, referred by her primary care physician Dr. Kellie Shropshire for evaluation of headache, initial evaluation was on June 30, 2019.  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 Brett Fairy a few times, most recent visit was Dec 2015  She also underwent PS and MSLT - MSLT had one SREMs and an average time to fall asleep of 8 minutes , her Epworth is 20 points this patient has severe hypersomnia and is at risk when driving. She is now taking nuvigil 231m qam.  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 .She lost to follow-up since 2016  Today she complains of recurrent transient left occipital area sharp radiating pain, started in July 2020, one severe episode last about couple minutes, she had a few smaller recurrent episode, she denies visual loss, no body achy pain  She is no longer using CPAP machine anymore, sleeps well at nighttime, occasionally  daytime sleepiness, before COVID-19, she has her own catering business,she now noticed occasionally word finding difficulty, short-term memory loss  Update November 224 [id number]:80year old female with history of memory loss and left occipital area radiating pain. MRI of the brain was unremarkable 07/29/2019.Laboratory evaluation, CRP, sed rate, TSH were normal. She indicates that the occipital pain has subsided.It may come in brief spurts, is tolerable, and does not last long or frequently.She indicates her memory isstable, continueddifficulty with short-term memory. She thinks she hasbecome aware of this,because she has been stuck in the house during the pandemic.She sleeps well at night, no longer uses CPAP. She drives, and cooks a lot for other people.She remainsquite active. She presents today for follow-up unaccompanied.   Update Apr 05, 2020 [name], [name], unclear why?  Indicates has been doing well, memory remains stable, continues have difficulty with names.  She manages her medications, come in the mail from [place] Had the Covid vaccine, 1 month later, woke up with several friends and orthopedic joints.  No longer uses CPAP, does not feel she needs it, feels rested during the day.  Is no longer doing her cooking/catering business.  She lives with her husband, does her own ADLs, housework, drives.  She remains active.  She no longer complains of the left occipital pain. BP elevated, manual 180/80, did not take blood pressure medications.  Update January 03, 2021 SS: Here today alone. MMSE 25/30. Lives with husband, does her own ADLs, keeps the household. Has trouble remembering names, thinks related to isolation from pandemic. Keeps medications, drives, keeps her appointments. Not using CPAP machine, sleeps  good. Having some back pain, seeing PCP. Denies any major issues. Very little cooking/catering. No more headache or  occipital pains. Still on Aricept 10 mg. Rarely taps daily naps. Lost about 10 lbs since last seen, seeing GI, was having diarrhea, is on another medication from GI, not on her list, doesn't know name that has taken care of it, unless eats dairy.  REVIEW OF SYSTEMS: Out of a complete 14 system review of symptoms, the patient complains only of the following symptoms, and all other reviewed systems are negative.  See HPI  ALLERGIES: Allergies  Allergen Reactions  . Morphine And Related Anaphylaxis    HOME MEDICATIONS: Outpatient Medications Prior to Visit  Medication Sig Dispense Refill  . amLODipine (NORVASC) 10 MG tablet Take 10 mg by mouth daily after breakfast.    . aspirin EC 81 MG tablet Take 81 mg by mouth at bedtime.    . Cinnamon 500 MG capsule Take 500 mg by mouth daily.    Marland Kitchen donepezil (ARICEPT) 10 MG tablet Take 1 tablet (10 mg total) by mouth at bedtime. 90 tablet 1  . fish oil-omega-3 fatty acids 1000 MG capsule Take 1 g by mouth daily.    Marland Kitchen gabapentin (NEURONTIN) 400 MG capsule Take 400 mg by mouth at bedtime.    Marland Kitchen losartan (COZAAR) 100 MG tablet Take 100 mg by mouth daily after breakfast.    . metFORMIN (GLUCOPHAGE) 500 MG tablet Take 500 mg by mouth 2 (two) times daily with a meal.    . metoprolol (TOPROL-XL) 50 MG 24 hr tablet Take 50 mg by mouth daily after breakfast.    . Polyethyl Glycol-Propyl Glycol 0.4-0.3 % SOLN Place 1 drop into both eyes daily.    . pravastatin (PRAVACHOL) 40 MG tablet Take 40 mg by mouth at bedtime.    . rivaroxaban (XARELTO) 20 MG TABS tablet Take 20 mg by mouth daily with supper.    Marland Kitchen acetaminophen-codeine (TYLENOL #3) 300-30 MG tablet Take 1 tablet by mouth every 6 (six) hours as needed (pain).   0  . budesonide-formoterol (SYMBICORT) 160-4.5 MCG/ACT inhaler Inhale 2 puffs into the lungs 2 (two) times daily.    . celecoxib (CELEBREX) 100 MG capsule Take 100 mg by mouth daily.    . cetirizine (ZYRTEC) 10 MG tablet Take 10 mg by mouth daily.     . Ferrous Sulfate (IRON PO) Take 1 tablet by mouth daily.    . ranitidine (ZANTAC) 300 MG tablet Take 300 mg by mouth at bedtime.     No facility-administered medications prior to visit.    PAST MEDICAL HISTORY: Past Medical History:  Diagnosis Date  . Arthritis   . Chronic headache   . Complication of anesthesia    allergy to Novocaine   . Diabetes mellitus   . GERD (gastroesophageal reflux disease)   . Heart murmur   . High cholesterol   . Hyperlipidemia   . Hypertension   . Intracranial atherosclerosis    per MRA  . Migraine   . Narcolepsy and cataplexy   . OSA on CPAP    states has not used CPAP in over a year  . Rectal prolapse   . Trigger finger of all digits of right hand     PAST SURGICAL HISTORY: Past Surgical History:  Procedure Laterality Date  . ABDOMINAL HYSTERECTOMY    . BACK SURGERY     L4,L5 discectomy  . CARDIAC CATHETERIZATION    . CARPAL TUNNEL RELEASE Right 01/18/2017  .  KIDNEY SURGERY  1998   Right.  growth removed  . rectal prolapse repair  12/05/11  . ROTATOR CUFF REPAIR     Left  . SHOULDER ARTHROSCOPY WITH SUBACROMIAL DECOMPRESSION, ROTATOR CUFF REPAIR AND BICEP TENDON REPAIR Left 05/25/2015   Procedure: SHOULDER ARTHROSCOPY WITH SUBACROMIAL DECOMPRESSION, ROTATOR CUFF REPAIR AND BICEP TENODESIS, DEBRIDEMENT. ;  Surgeon: Meredith Pel, MD;  Location: [facility];  Service: Orthopedics;  Laterality: Left;  LEFT SHOULDER ROTATOR CUFF TEAR REPAIR, ARTHROSCOPY, DEBRIDEMENT, BICEPS TENODESIS, SUBACROMIAL DECOMPRESSION.  . TOTAL ABDOMINAL HYSTERECTOMY    . TRIGGER FINGER RELEASE Right 04/30/2018   Procedure: RELEASE TRIGGER FINGER/A-1 PULLEY RIGHT MIDDLE  AND SMALLn ;  Surgeon: Daryll Brod, MD;  Location: [facility];  Service: Orthopedics;  Laterality: Right;  FAB, Bier block  . TUBAL LIGATION      FAMILY HISTORY: Family History  Problem Relation Age of Onset  . Heart disease Mother   . Throat cancer Father   . Cancer Father         stomach  . Kidney cancer Brother   . Heart disease Brother     SOCIAL HISTORY: Social History   Socioeconomic History  . Marital status: Married    Spouse name: [name]  . Number of children: 4  . Years of education: 63  . Highest education level: Not on file  Occupational History  . Occupation: retired. prev worked at [employer].  Tobacco Use  . Smoking status: Former Smoker    Quit date: 11/13/1980    Years since quitting: 40.1  . Smokeless tobacco: Never Used  . Tobacco comment: quit in 1982  Substance and Sexual Activity  . Alcohol use: No    Alcohol/week: 0.0 standard drinks  . Drug use: No  . Sexual activity: Not Currently  Other Topics Concern  . Not on file  Social History Narrative   21 st January 2014 , patient underwent PS and MSLT - MSLT had one  SREMs and an average time to fall asleep of  *.8 minutes , her Ewort is 20 points,: facit: this patient has severe hypersomnia and is at risk when driving. medication in form ogf nuvigil smaples had been dispensed to her but she ha not yet taken it. 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 In detail -discussion of diagnosis and treatment  takes place today , first with nuvigil and if insufficient, with XYREM. Her CPAP treats her OSA very well, and she uses it 6 hours  or more each night,  residual AHI of 1.1  would not allow for OSA to be still explaining this degree of sleepiness. 2 downloads were reviewed. labs reviewed.    Patient is married [name]) and lives at home with her husband and grandchild.   Patient has four children   Patient is retired.   Patient has a college education.   Patient is right-handed.   Patient does not drink any caffeine.      Social Determinants of Health   Financial Resource Strain: Not on file  Food Insecurity: Not on file   Transportation Needs: Not on file  Physical Activity: Not on file  Stress: Not on file  Social Connections: Not on file  Intimate Partner Violence: Not on file   PHYSICAL EXAM  Vitals:   01/03/21 0936  BP: Marland Kitchen)  165/69  Pulse: (!) 58  Weight: 112 lb (50.8 kg)  Height: _0  (1.499 m)   Body mass index is 22.62 kg/m.  Generalized: Well developed, in no acute distress, well-appearing MMSE - Mini Mental State Exam 01/03/2021 04/05/2020 10/07/2019  Orientation to time _1 Orientation to Place _2 Registration _3 Attention/ Calculation _4 Recall 2 0 0  Language- name 2 objects _5 Language- repeat _6 Language- follow 3 step command _7 Language- read & follow direction _8 Write a sentence _9 Copy design _10 Copy design-comments - 5 animals named 7 animals  Total score _11 Neurological examination  Mentation: Alert oriented to time, place, history taking. Follows all commands speech and language fluent Cranial nerve II-XII: Pupils were equal round reactive to light. Extraocular movements were full, visual field were full on confrontational test. Facial sensation and strength were normal. Head turning and shoulder shrug  were normal and symmetric. Motor: The motor testing reveals 5 over 5 strength of all 4 extremities. Good symmetric motor tone is noted throughout.  Sensory: Sensory testing is intact to soft touch on all 4 extremities. No evidence of extinction is noted.  Coordination: Cerebellar testing reveals good finger-nose-finger and heel-to-shin bilaterally.  Gait and station: Gait is normal.  Reflexes: Deep tendon reflexes are symmetric and normal bilaterally.   DIAGNOSTIC DATA (LABS, IMAGING, TESTING) - I reviewed patient records, labs, notes, testing and imaging myself where available.  Lab Results  Component Value Date   WBC 6.0 06/03/2019   HGB 11.8 (L) 06/03/2019   HCT 36.1 06/03/2019   MCV 89.4 06/03/2019   PLT 269  06/03/2019      Component Value Date/Time   NA 140 06/03/2019 1725   K 4.2 06/03/2019 1725   CL 107 06/03/2019 1725   CO2 21 (L) 06/03/2019 1725   GLUCOSE 81 06/03/2019 1725   BUN 14 06/03/2019 1725   CREATININE 1.19 (H) 06/03/2019 1725   CALCIUM 9.9 06/03/2019 1725   PROT 7.1 01/30/2017 1336   ALBUMIN 3.5 01/30/2017 1336   AST 24 01/30/2017 1336   ALT 15 01/30/2017 1336   ALKPHOS 56 01/30/2017 1336   BILITOT 0.5 01/30/2017 1336   GFRNONAA 43 (L) 06/03/2019 1725   GFRAA 50 (L) 06/03/2019 1725   No results found for: CHOL, HDL, LDLCALC, LDLDIRECT, TRIG, CHOLHDL Lab Results  Component Value Date   HGBA1C 6.2 (H) 05/13/2015   No results found for: VITAMINB12 Lab Results  Component Value Date   TSH 1.220 06/30/2019   ASSESSMENT AND PLAN 80 y.o. year old female  has a past medical history of Arthritis, Chronic headache, Complication of anesthesia, Diabetes mellitus, GERD (gastroesophageal reflux disease), Heart murmur, High cholesterol, Hyperlipidemia, Hypertension, Intracranial atherosclerosis, Migraine, Narcolepsy and cataplexy, OSA on CPAP, Rectal prolapse, and Trigger finger of all digits of right hand. here with:  1.  Left occipital area radiating pain -ESR, CRP, TSH were normal -Issue has resolved  2.  Memory loss -MMSE 25/30 today -MRI of the brain was unremarkable -Can keep Aricept 10 mg daily, was having diarrhea, but no longer after seeing GI, I told her could be side effect of Aricept to keep an eye out for, weight loss 10 lbs since May 2021 -No longer using CPAP, doesn't feel needed, rare daily naps -Continue follow-up with PCP, follow-up  in our office on an as-needed basis, and Namenda could be added if needed  I spent 20 minutes of face-to-face and non-face-to-face time with patient.  This included previsit chart review, lab review, study review, order entry, electronic health record documentation, patient education.  Butler Denmark, AGNP-C, DNP 01/03/2021, 9:51  AM [facility] [address] [place], [place] [address] [phone]

## 2021-01-03 NOTE — Patient Instructions (Signed)
Continue the Aricept but keep an eye out for weight loss, diarrhea that can come as side effect of this Continue to see Dr. Royce Macadamia, he can refill the medication going forward See you back as needed

## 2021-02-24 ENCOUNTER — Other Ambulatory Visit: Payer: Self-pay | Admitting: Gastroenterology

## 2021-02-24 ENCOUNTER — Other Ambulatory Visit: Payer: Self-pay | Admitting: Pediatrics

## 2021-02-24 DIAGNOSIS — R634 Abnormal weight loss: Secondary | ICD-10-CM

## 2021-03-10 ENCOUNTER — Emergency Department (HOSPITAL_COMMUNITY)
Admission: EM | Admit: 2021-03-10 | Discharge: 2021-03-10 | Disposition: A | Discharge status: Home / Self Care | Payer: Medicare Other | Attending: Emergency Medicine | Admitting: Emergency Medicine

## 2021-03-10 ENCOUNTER — Emergency Department (HOSPITAL_COMMUNITY): Payer: Medicare Other

## 2021-03-10 ENCOUNTER — Encounter (HOSPITAL_COMMUNITY): Payer: Self-pay

## 2021-03-10 DIAGNOSIS — U071 COVID-19: Secondary | ICD-10-CM | POA: Insufficient documentation

## 2021-03-10 DIAGNOSIS — I10 Essential (primary) hypertension: Secondary | ICD-10-CM | POA: Diagnosis not present

## 2021-03-10 DIAGNOSIS — Z7982 Long term (current) use of aspirin: Secondary | ICD-10-CM | POA: Insufficient documentation

## 2021-03-10 DIAGNOSIS — E119 Type 2 diabetes mellitus without complications: Secondary | ICD-10-CM | POA: Insufficient documentation

## 2021-03-10 DIAGNOSIS — Z79899 Other long term (current) drug therapy: Secondary | ICD-10-CM | POA: Insufficient documentation

## 2021-03-10 DIAGNOSIS — Z7984 Long term (current) use of oral hypoglycemic drugs: Secondary | ICD-10-CM | POA: Diagnosis not present

## 2021-03-10 DIAGNOSIS — R4182 Altered mental status, unspecified: Secondary | ICD-10-CM | POA: Insufficient documentation

## 2021-03-10 DIAGNOSIS — Z87891 Personal history of nicotine dependence: Secondary | ICD-10-CM | POA: Diagnosis not present

## 2021-03-10 DIAGNOSIS — R471 Dysarthria and anarthria: Secondary | ICD-10-CM | POA: Diagnosis not present

## 2021-03-10 DIAGNOSIS — T887XXA Unspecified adverse effect of drug or medicament, initial encounter: Secondary | ICD-10-CM

## 2021-03-10 DIAGNOSIS — Z9861 Coronary angioplasty status: Secondary | ICD-10-CM | POA: Insufficient documentation

## 2021-03-10 DIAGNOSIS — T40715A Adverse effect of cannabis, initial encounter: Secondary | ICD-10-CM | POA: Diagnosis not present

## 2021-03-10 LAB — DIFFERENTIAL
Abs Immature Granulocytes: 0.01 10*3/uL (ref 0.00–0.07)
Basophils Absolute: 0 10*3/uL (ref 0.0–0.1)
Basophils Relative: 0 %
Eosinophils Absolute: 0.1 10*3/uL (ref 0.0–0.5)
Eosinophils Relative: 2 %
Immature Granulocytes: 0 %
Lymphocytes Relative: 17 %
Lymphs Abs: 0.9 10*3/uL (ref 0.7–4.0)
Monocytes Absolute: 0.4 10*3/uL (ref 0.1–1.0)
Monocytes Relative: 7 %
Neutro Abs: 3.7 10*3/uL (ref 1.7–7.7)
Neutrophils Relative %: 74 %

## 2021-03-10 LAB — I-STAT CHEM 8, ED
BUN: 12 mg/dL (ref 8–23)
Calcium, Ion: 1.24 mmol/L (ref 1.15–1.40)
Chloride: 108 mmol/L (ref 98–111)
Creatinine, Ser: 1.2 mg/dL — ABNORMAL HIGH (ref 0.44–1.00)
Glucose, Bld: 165 mg/dL — ABNORMAL HIGH (ref 70–99)
HCT: 30 % — ABNORMAL LOW (ref 36.0–46.0)
Hemoglobin: 10.2 g/dL — ABNORMAL LOW (ref 12.0–15.0)
Potassium: 4.2 mmol/L (ref 3.5–5.1)
Sodium: 140 mmol/L (ref 135–145)
TCO2: 22 mmol/L (ref 22–32)

## 2021-03-10 LAB — CBC
HCT: 31.6 % — ABNORMAL LOW (ref 36.0–46.0)
Hemoglobin: 9.9 g/dL — ABNORMAL LOW (ref 12.0–15.0)
MCH: 28.3 pg (ref 26.0–34.0)
MCHC: 31.3 g/dL (ref 30.0–36.0)
MCV: 90.3 fL (ref 80.0–100.0)
Platelets: 226 10*3/uL (ref 150–400)
RBC: 3.5 MIL/uL — ABNORMAL LOW (ref 3.87–5.11)
RDW: 15.4 % (ref 11.5–15.5)
WBC: 5.1 10*3/uL (ref 4.0–10.5)
nRBC: 0 % (ref 0.0–0.2)

## 2021-03-10 LAB — URINALYSIS, ROUTINE W REFLEX MICROSCOPIC
Bilirubin Urine: NEGATIVE
Glucose, UA: NEGATIVE mg/dL
Hgb urine dipstick: NEGATIVE
Ketones, ur: NEGATIVE mg/dL
Nitrite: NEGATIVE
Protein, ur: NEGATIVE mg/dL
Specific Gravity, Urine: 1.009 (ref 1.005–1.030)
pH: 5 (ref 5.0–8.0)

## 2021-03-10 LAB — COMPREHENSIVE METABOLIC PANEL
ALT: 21 U/L (ref 0–44)
AST: 31 U/L (ref 15–41)
Albumin: 4 g/dL (ref 3.5–5.0)
Alkaline Phosphatase: 52 U/L (ref 38–126)
Anion gap: 8 (ref 5–15)
BUN: 13 mg/dL (ref 8–23)
CO2: 22 mmol/L (ref 22–32)
Calcium: 9.2 mg/dL (ref 8.9–10.3)
Chloride: 108 mmol/L (ref 98–111)
Creatinine, Ser: 1.22 mg/dL — ABNORMAL HIGH (ref 0.44–1.00)
GFR, Estimated: 45 mL/min — ABNORMAL LOW (ref >60)
Glucose, Bld: 165 mg/dL — ABNORMAL HIGH (ref 70–99)
Potassium: 4.4 mmol/L (ref 3.5–5.1)
Sodium: 138 mmol/L (ref 135–145)
Total Bilirubin: 0.3 mg/dL (ref 0.3–1.2)
Total Protein: 7.6 g/dL (ref 6.5–8.1)

## 2021-03-10 LAB — ETHANOL: Alcohol, Ethyl (B): <10 mg/dL (ref <10)

## 2021-03-10 LAB — RESP PANEL BY RT-PCR (FLU A&B, COVID) ARPGX2
Influenza A by PCR: NEGATIVE
Influenza B by PCR: NEGATIVE
SARS Coronavirus 2 by RT PCR: POSITIVE — AB

## 2021-03-10 LAB — RAPID URINE DRUG SCREEN, HOSP PERFORMED
Amphetamines: NOT DETECTED
Barbiturates: NOT DETECTED
Benzodiazepines: POSITIVE — AB
Cocaine: NOT DETECTED
Opiates: NOT DETECTED
Tetrahydrocannabinol: POSITIVE — AB

## 2021-03-10 LAB — PROTIME-INR
INR: 1 (ref 0.8–1.2)
Prothrombin Time: 13.4 seconds (ref 11.4–15.2)

## 2021-03-10 LAB — APTT: aPTT: 30 seconds (ref 24–36)

## 2021-03-10 MED ORDER — MOLNUPIRAVIR EUA 200MG CAPSULE: 4.0000 | ORAL_CAPSULE | Freq: Two times a day (BID) | ORAL | 0 refills | Status: AC

## 2021-03-10 NOTE — ED Triage Notes (Addendum)
Pt accidentally took one 10mg  THC gummy at around 12 pm; thought it was a vitamin. Denies any specific complaints, states "I just hate this feeling, I am bugging out". Rambling in triage. A&Ox4.

## 2021-03-10 NOTE — ED Provider Notes (Signed)
[facility] DEPT Provider Note   CSN: [id number] Arrival date & time: 03/10/21  1311     History Chief Complaint  Patient presents with  . Ingestion    Stephanie Barber is a 80 y.o. female.  HPI     80 year old female comes with a chief complaint of ingestion and altered mental status.  According to the patient's husband, when he came back into the house after doing some yard work, patient was slumped down on a table and was unresponsive.  When she did respond she was slurring and not making a lot of sense.  The episode lasted for about 30 minutes and he called EMS.  Patient was last seen normal around 11:00 in the morning.  Patient recalls sitting on the recliner and going through her medications.  She does report taking a CBD gummy between 9 and 11.  She does not recall her husband trying to wake her up or her not making sense.  She currently does feel a little woozy but is oriented and knows where she is.  At no point did patient have chest pain, palpitations, shortness of breath, headaches, neck pain, focal numbness, focal weakness.  She had no prodromal symptoms of dizziness, lightheadedness, near fainting.   She reports that she has never taken a CBD gummy before. There is no known history of stroke.  Patient does not have a significant cardiac history.  No history of fainting in the past.  No history of PE, DVT, seizures.  Past Medical History:  Diagnosis Date  . Arthritis   . Chronic headache   . Complication of anesthesia    allergy to Novocaine   . Diabetes mellitus   . GERD (gastroesophageal reflux disease)   . Heart murmur   . High cholesterol   . Hyperlipidemia   . Hypertension   . Intracranial atherosclerosis    per MRA  . Migraine   . Narcolepsy and cataplexy   . OSA on CPAP    states has not used CPAP in over a year  . Rectal prolapse   . Trigger finger of all digits of right hand     Patient Active Problem List    Diagnosis Date Noted  . Left-sided headache 06/30/2019  . Memory loss 06/30/2019  . AKI (acute kidney injury) ([place]) 05/06/2017  . Dehydration 05/06/2017  . Chest pain 05/06/2017  . Wound infection after surgery 01/30/2017  . Palpitations 08/24/2016  . Noncompliance with CPAP treatment 12/28/2015  . Poor compliance with CPAP treatment 10/26/2014  . CPAP use counseling 07/28/2014  . OSA on CPAP 07/28/2014  . 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 Hypersomnolent 04/02/2014  . Obstructive sleep apnea 04/02/2014  . Non-compliance with treatment 03/05/2014  . Diabetes mellitus ([place]) 12/05/2011  . Hypertension 12/05/2011  . GERD (gastroesophageal reflux disease)   . 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 Chronic cough 07/26/2011    Past Surgical History:  Procedure Laterality Date  . ABDOMINAL HYSTERECTOMY    . BACK SURGERY     L4,L5 discectomy  . CARDIAC CATHETERIZATION    . CARPAL TUNNEL RELEASE Right 01/18/2017  . KIDNEY SURGERY  1998   Right.  growth removed  . rectal prolapse repair  12/05/11  . ROTATOR CUFF REPAIR     Left  . SHOULDER ARTHROSCOPY WITH SUBACROMIAL DECOMPRESSION, ROTATOR CUFF REPAIR AND BICEP TENDON REPAIR Left 05/25/2015   Procedure: SHOULDER ARTHROSCOPY WITH SUBACROMIAL DECOMPRESSION, ROTATOR CUFF REPAIR AND BICEP TENODESIS, DEBRIDEMENT. ;  Surgeon:  Meredith Pel, MD;  Location: [facility];  Service: Orthopedics;  Laterality: Left;  LEFT SHOULDER ROTATOR CUFF TEAR REPAIR, ARTHROSCOPY, DEBRIDEMENT, BICEPS TENODESIS, SUBACROMIAL DECOMPRESSION.  . TOTAL ABDOMINAL HYSTERECTOMY    . TRIGGER FINGER RELEASE Right 04/30/2018   Procedure: RELEASE TRIGGER FINGER/A-1 PULLEY RIGHT MIDDLE  AND SMALLn ;  Surgeon: Daryll Brod, MD;  Location: [facility];  Service: Orthopedics;  Laterality: Right;  FAB, Bier block  . TUBAL LIGATION       OB History   No obstetric history on file.     Family History  Problem Relation Age of Onset  . Heart disease  Mother   . Throat cancer Father   . Cancer Father        stomach  . Kidney cancer Brother   . Heart disease Brother     Social History   Tobacco Use  . Smoking status: Former Smoker    Quit date: 11/13/1980    Years since quitting: 40.3  . Smokeless tobacco: Never Used  . Tobacco comment: quit in 1982  Substance Use Topics  . Alcohol use: No    Alcohol/week: 0.0 standard drinks  . Drug use: No    Home Medications Prior to Admission medications   Medication Sig Start Date End Date Taking? Authorizing Provider  molnupiravir EUA 200 mg CAPS Take 4 capsules (800 mg total) by mouth 2 (two) times daily for 5 days. 03/10/21 03/15/21 Yes Davyn Elsasser, MD  amLODipine (NORVASC) 10 MG tablet Take 10 mg by mouth daily after breakfast.    [provider]  aspirin EC 81 MG tablet Take 81 mg by mouth at bedtime.    [provider]  Cinnamon 500 MG capsule Take 500 mg by mouth daily.    [provider]  donepezil (ARICEPT) 10 MG tablet Take 1 tablet (10 mg total) by mouth at bedtime. 01/03/21   [name], NP  fish oil-omega-3 fatty acids 1000 MG capsule Take 1 g by mouth daily.    [provider]  gabapentin (NEURONTIN) 400 MG capsule Take 400 mg by mouth at bedtime.    [provider]  losartan (COZAAR) 100 MG tablet Take 100 mg by mouth daily after breakfast.    [provider]  metFORMIN (GLUCOPHAGE) 500 MG tablet Take 500 mg by mouth 2 (two) times daily with a meal.    [provider]  metoprolol (TOPROL-XL) 50 MG 24 hr tablet Take 50 mg by mouth daily after breakfast.    [provider]  Polyethyl Glycol-Propyl Glycol 0.4-0.3 % SOLN Place 1 drop into both eyes daily.    [provider]  pravastatin (PRAVACHOL) 40 MG tablet Take 40 mg by mouth at bedtime.    [provider]  rivaroxaban (XARELTO) 20 MG TABS tablet Take 20 mg by mouth daily with supper.    [provider]    Allergies     Morphine and related  Review of Systems   Review of Systems  Constitutional: Positive for activity change.  Respiratory: Negative for shortness of breath.   Cardiovascular: Negative for chest pain.  Gastrointestinal: Negative for nausea and vomiting.  Neurological: Positive for speech difficulty.  Psychiatric/Behavioral: Positive for confusion.  All other systems reviewed and are negative.   Physical Exam Updated Vital Signs BP 128/73   Pulse (!) 56   Temp 98.8 F (37.1 C) (Oral)   Resp 13   Wt 50 kg   SpO2 98%   BMI  22.26 kg/m   Physical Exam Vitals and nursing note reviewed.  Constitutional:      Appearance: She is well-developed.  HENT:     Head: Normocephalic and atraumatic.  Eyes:     Extraocular Movements: Extraocular movements intact.     Pupils: Pupils are equal, round, and reactive to light.  Cardiovascular:     Rate and Rhythm: Normal rate.  Pulmonary:     Effort: Pulmonary effort is normal.  Abdominal:     General: Bowel sounds are normal.  Musculoskeletal:     Cervical back: Normal range of motion and neck supple.  Skin:    General: Skin is warm and dry.  Neurological:     General: No focal deficit present.     Mental Status: She is alert and oriented to person, place, and time. Mental status is at baseline.     Cranial Nerves: No cranial nerve deficit.     Sensory: No sensory deficit.     Motor: No weakness.     Coordination: Coordination normal.     Gait: Gait normal.     Deep Tendon Reflexes: Reflexes normal.     ED Results / Procedures / Treatments   Labs (all labs ordered are listed, but only abnormal results are displayed) Labs Reviewed  RESP PANEL BY RT-PCR (FLU A&B, COVID) ARPGX2 - Abnormal; Notable for the following components:      Result Value   SARS Coronavirus 2 by RT PCR POSITIVE (*)    All other components within normal limits  CBC - Abnormal; Notable for the following components:   RBC 3.50 (*)    Hemoglobin 9.9 (*)     HCT 31.6 (*)    All other components within normal limits  COMPREHENSIVE METABOLIC PANEL - Abnormal; Notable for the following components:   Glucose, Bld 165 (*)    Creatinine, Ser 1.22 (*)    GFR, Estimated 45 (*)    All other components within normal limits  RAPID URINE DRUG SCREEN, HOSP PERFORMED - Abnormal; Notable for the following components:   Benzodiazepines POSITIVE (*)    Tetrahydrocannabinol POSITIVE (*)    All other components within normal limits  URINALYSIS, ROUTINE W REFLEX MICROSCOPIC - Abnormal; Notable for the following components:   Color, Urine STRAW (*)    Leukocytes,Ua MODERATE (*)    Bacteria, UA RARE (*)    All other components within normal limits  I-STAT CHEM 8, ED - Abnormal; Notable for the following components:   Creatinine, Ser 1.20 (*)    Glucose, Bld 165 (*)    Hemoglobin 10.2 (*)    HCT 30.0 (*)    All other components within normal limits  ETHANOL  PROTIME-INR  APTT  DIFFERENTIAL    EKG EKG Interpretation  Date/Time:  Thursday March 10 2021 15:18:25 EDT Ventricular Rate:  61 PR Interval:  158 QRS Duration: 81 QT Interval:  397 QTC Calculation: 400 R Axis:   48 Text Interpretation: Sinus rhythm Low voltage, precordial leads No acute changes No significant change since last tracing Confirmed by Varney Biles [phone]) on 03/10/2021 5:35:06 PM   Radiology CT HEAD WO CONTRAST  Result Date: 03/10/2021 CLINICAL DATA:  Dizziness EXAM: CT HEAD WITHOUT CONTRAST TECHNIQUE: Contiguous axial images were obtained from the base of the skull through the vertex without intravenous contrast. COMPARISON:  None. FINDINGS: Brain: Ventricles and sulci are normal in size and configuration. There is slight frontal atrophy bilaterally, symmetric. There is no intracranial mass, hemorrhage, extra-axial fluid collection,  or midline shift. Brain parenchyma appears unremarkable. No acute infarct evident. Vascular: No hyperdense vessel. There are foci of arterial  vascular calcification in the carotid siphon regions bilaterally. Skull: Bony calvarium appears intact. Sinuses/Orbits: Mild mucosal thickening noted in several ethmoid air cells. Other visualized paranasal sinuses are clear. Visualized orbits appear symmetric bilaterally. Other: Mastoid air cells are clear. IMPRESSION: Mild frontal atrophy bilaterally. Brain parenchyma unremarkable in appearance without acute infarct. No mass or hemorrhage. There are foci of arterial vascular calcification. There is mild mucosal thickening in several ethmoid air cells. Electronically Signed   By: Lowella Grip III M.D.   On: 03/10/2021 15:39   MR BRAIN WO CONTRAST  Result Date: 03/10/2021 CLINICAL DATA:  Altered mental status EXAM: MRI HEAD WITHOUT CONTRAST TECHNIQUE: Multiplanar, multiecho pulse sequences of the brain and surrounding structures were obtained without intravenous contrast. COMPARISON:  07/29/2019 dictated by non radiologist FINDINGS: Brain: There is no acute infarction or intracranial hemorrhage. There is no intracranial mass, mass effect, or edema. There is no hydrocephalus or extra-axial fluid collection. Ventricles and sulci are normal in size and configuration. Patchy foci of T2 hyperintensity in the supratentorial white matter nonspecific may reflect minor chronic microvascular ischemic changes. Small chronic right cerebellar infarcts, which are present on the prior study in retrospect. Vascular: Major vessel flow voids at the skull base are preserved. Skull and upper cervical spine: Normal marrow signal is preserved. Sinuses/Orbits: Trace paranasal sinus mucosal thickening. Bilateral lens replacement. Other: Sella is unremarkable.  Mastoid air cells are clear. IMPRESSION: No evidence of recent infarction, hemorrhage, or mass. Minor chronic microvascular ischemic changes. Small chronic right cerebellar infarcts. No significant change since 07/29/2019. Electronically Signed   By: Macy Mis M.D.    On: 03/10/2021 17:58    Procedures Procedures   Medications Ordered in ED Medications - No data to display  ED Course  I have reviewed the triage vital signs and the nursing notes.  Pertinent labs & imaging results that were available during my care of the patient were reviewed by me and considered in my medical decision making (see chart for details).    MDM Rules/Calculators/A&P                          80 year old female comes with a chief complaint of acute altered mental status.  Husband reports that there was some dysarthria and confusion.  It appears that patient might have been aphasic.  Confounding the situation is that she had taken some CBD gummy about 2 to 4 hours prior to the episode.  Intriguing factor is that there was no prodrome prior to this episode.  Patient never had any ill effect of the medication such as getting dizzy, lightheaded, sick to her stomach prior to this episode.  Differential diagnosis would include TIA versus stroke and side effect from the medicine.  We looked up the peak effect of CBD Gummies and it is about 4 hours.  Timing wise it appears that her symptoms probably did come about 2 to 4 hours after she ingested CBD gummy.  Currently her neuro exam is completely nonfocal.  Suspicion is high that patient most likely had CBD side effect.  Syncope also considered in the differential, EKG is reassuring.  Patient does not have any high risk cardiac disease history.  Discussed the case with Dr. Kathrynn Speed, neurology.  He is comfortable with patient getting MRI, which if negative she can be discharged with PCP follow-up.  8:12 PM Patient's MRI is negative for acute process.  Results discussed with her.  Her COVID-19 test came back incidentally positive.  Patient does indicate that she had some runny nose, which she chalked it off to allergies.  She is fully vaccinated and boosted.  At this time she is asymptomatic and has no weakness, nausea,  chest pain, cough, shortness of breath  We have advised her to isolate herself for the next 5 days.  We have given her oral antiviral prescription. Advised to discuss the side effects and drug interaction with the pharmacist.  Strict ER return precautions have been discussed, and patient is agreeing with the plan and is comfortable with the workup done and the recommendations from the ER.   Stephanie Barber was evaluated in Emergency Department on 03/10/2021 for the symptoms described in the history of present illness. She was evaluated in the context of the global COVID-19 pandemic, which necessitated consideration that the patient might be at risk for infection with the SARS-CoV-2 virus that causes COVID-19. Institutional protocols and algorithms that pertain to the evaluation of patients at risk for COVID-19 are in a state of rapid change based on information released by regulatory bodies including the CDC and federal and state organizations. These policies and algorithms were followed during the patient's care in the ED.   Final Clinical Impression(s) / ED Diagnoses Final diagnoses:  Side effect of drug  COVID-19    Rx / DC Orders ED Discharge Orders         Ordered    molnupiravir EUA 200 mg CAPS  2 times daily        03/10/21 1944           Varney Biles, MD 03/10/21 2013

## 2021-03-10 NOTE — Discharge Instructions (Signed)
You had incidental finding of covid. Stay home for 5 days and isolate from others in your home.  Prescribed is oral antiviral medicine. Talk to pharmacy to see if they have it available and in stock. Talk to pharmacist before starting it to make sure you dont have any drug interactions.  Rest of the work-up in the ER including MRI of your brain was normal.  We suspect that your change in behavior was likely because of the CBD gummy bear.

## 2021-03-10 NOTE — ED Notes (Signed)
Husband at bedside; notified this RN that pt may have also taken 2 tablets of 650 mg tylenol. Notified MD Nan[place]ati.

## 2021-03-10 NOTE — ED Notes (Signed)
Pt anxious, lights dimmed to help relax patient. Bed alarm on.

## 2021-03-16 ENCOUNTER — Ambulatory Visit
Admission: RE | Admit: 2021-03-16 | Discharge: 2021-03-16 | Disposition: A | Discharge status: Home / Self Care | Payer: Medicare Other | Source: Ambulatory Visit | Attending: Gastroenterology | Admitting: Gastroenterology

## 2021-03-16 ENCOUNTER — Other Ambulatory Visit: Payer: Self-pay

## 2021-03-16 DIAGNOSIS — R634 Abnormal weight loss: Secondary | ICD-10-CM

## 2021-03-16 MED ORDER — IOPAMIDOL (ISOVUE-300) INJECTION 61%
100.0000 mL | Freq: Once | INTRAVENOUS | Status: AC | PRN
  Administered 2021-03-16: 80 mL via INTRAVENOUS

## 2021-06-08 ENCOUNTER — Inpatient Hospital Stay: Payer: Medicare Other

## 2021-06-08 ENCOUNTER — Telehealth: Payer: Self-pay

## 2021-06-08 ENCOUNTER — Inpatient Hospital Stay: Payer: Medicare Other | Attending: Hematology and Oncology | Admitting: Hematology and Oncology

## 2021-06-08 NOTE — Telephone Encounter (Signed)
Attempted to call patient on primary number regarding today's appointment. No answer. Left voicemail providing patient with phone number to reschedule appointments.   

## 2021-06-09 ENCOUNTER — Telehealth: Payer: Self-pay | Admitting: Hematology and Oncology

## 2021-06-09 NOTE — Telephone Encounter (Signed)
Ms. Tripi has been cld and rescheduled to see Dr. Lorenso Courier on 8/11 at 140pm.

## 2021-06-15 ENCOUNTER — Encounter (HOSPITAL_BASED_OUTPATIENT_CLINIC_OR_DEPARTMENT_OTHER): Payer: Self-pay

## 2021-06-15 ENCOUNTER — Observation Stay (HOSPITAL_BASED_OUTPATIENT_CLINIC_OR_DEPARTMENT_OTHER)
Admission: EM | Admit: 2021-06-15 | Discharge: 2021-06-17 | Disposition: A | Discharge status: Home / Self Care | Payer: Medicare Other | Attending: Internal Medicine | Admitting: Internal Medicine

## 2021-06-15 ENCOUNTER — Other Ambulatory Visit: Payer: Self-pay

## 2021-06-15 ENCOUNTER — Emergency Department (HOSPITAL_BASED_OUTPATIENT_CLINIC_OR_DEPARTMENT_OTHER): Payer: Medicare Other

## 2021-06-15 DIAGNOSIS — Z20822 Contact with and (suspected) exposure to covid-19: Secondary | ICD-10-CM | POA: Diagnosis not present

## 2021-06-15 DIAGNOSIS — E1169 Type 2 diabetes mellitus with other specified complication: Secondary | ICD-10-CM | POA: Diagnosis present

## 2021-06-15 DIAGNOSIS — K5730 Diverticulosis of large intestine without perforation or abscess without bleeding: Secondary | ICD-10-CM | POA: Insufficient documentation

## 2021-06-15 DIAGNOSIS — Z86718 Personal history of other venous thrombosis and embolism: Secondary | ICD-10-CM | POA: Insufficient documentation

## 2021-06-15 DIAGNOSIS — M542 Cervicalgia: Secondary | ICD-10-CM | POA: Diagnosis not present

## 2021-06-15 DIAGNOSIS — K625 Hemorrhage of anus and rectum: Secondary | ICD-10-CM | POA: Diagnosis present

## 2021-06-15 DIAGNOSIS — E785 Hyperlipidemia, unspecified: Secondary | ICD-10-CM | POA: Diagnosis present

## 2021-06-15 DIAGNOSIS — Z7901 Long term (current) use of anticoagulants: Secondary | ICD-10-CM | POA: Insufficient documentation

## 2021-06-15 DIAGNOSIS — K921 Melena: Secondary | ICD-10-CM | POA: Diagnosis not present

## 2021-06-15 DIAGNOSIS — R42 Dizziness and giddiness: Secondary | ICD-10-CM | POA: Diagnosis not present

## 2021-06-15 DIAGNOSIS — E119 Type 2 diabetes mellitus without complications: Secondary | ICD-10-CM | POA: Diagnosis not present

## 2021-06-15 DIAGNOSIS — K2900 Acute gastritis without bleeding: Secondary | ICD-10-CM | POA: Diagnosis not present

## 2021-06-15 DIAGNOSIS — E1159 Type 2 diabetes mellitus with other circulatory complications: Secondary | ICD-10-CM | POA: Diagnosis present

## 2021-06-15 DIAGNOSIS — Z7984 Long term (current) use of oral hypoglycemic drugs: Secondary | ICD-10-CM | POA: Insufficient documentation

## 2021-06-15 DIAGNOSIS — K640 First degree hemorrhoids: Secondary | ICD-10-CM | POA: Diagnosis not present

## 2021-06-15 DIAGNOSIS — K922 Gastrointestinal hemorrhage, unspecified: Secondary | ICD-10-CM | POA: Diagnosis not present

## 2021-06-15 DIAGNOSIS — Z79899 Other long term (current) drug therapy: Secondary | ICD-10-CM | POA: Diagnosis not present

## 2021-06-15 DIAGNOSIS — Z980 Intestinal bypass and anastomosis status: Secondary | ICD-10-CM | POA: Insufficient documentation

## 2021-06-15 DIAGNOSIS — Z7982 Long term (current) use of aspirin: Secondary | ICD-10-CM | POA: Insufficient documentation

## 2021-06-15 DIAGNOSIS — D125 Benign neoplasm of sigmoid colon: Principal | ICD-10-CM | POA: Insufficient documentation

## 2021-06-15 DIAGNOSIS — Z043 Encounter for examination and observation following other accident: Secondary | ICD-10-CM | POA: Diagnosis not present

## 2021-06-15 DIAGNOSIS — E440 Moderate protein-calorie malnutrition: Secondary | ICD-10-CM | POA: Insufficient documentation

## 2021-06-15 DIAGNOSIS — Z87891 Personal history of nicotine dependence: Secondary | ICD-10-CM | POA: Diagnosis not present

## 2021-06-15 DIAGNOSIS — I10 Essential (primary) hypertension: Secondary | ICD-10-CM | POA: Diagnosis present

## 2021-06-15 DIAGNOSIS — N200 Calculus of kidney: Secondary | ICD-10-CM | POA: Diagnosis not present

## 2021-06-15 DIAGNOSIS — S0990XA Unspecified injury of head, initial encounter: Secondary | ICD-10-CM | POA: Diagnosis not present

## 2021-06-15 DIAGNOSIS — I152 Hypertension secondary to endocrine disorders: Secondary | ICD-10-CM | POA: Diagnosis present

## 2021-06-15 LAB — BASIC METABOLIC PANEL WITH GFR
Anion gap: 9 (ref 5–15)
BUN: 23 mg/dL (ref 8–23)
CO2: 24 mmol/L (ref 22–32)
Calcium: 9.1 mg/dL (ref 8.9–10.3)
Chloride: 106 mmol/L (ref 98–111)
Creatinine, Ser: 1.22 mg/dL — ABNORMAL HIGH (ref 0.44–1.00)
GFR, Estimated: 45 mL/min — ABNORMAL LOW
Glucose, Bld: 87 mg/dL (ref 70–99)
Potassium: 4.5 mmol/L (ref 3.5–5.1)
Sodium: 139 mmol/L (ref 135–145)

## 2021-06-15 LAB — HEMOGLOBIN AND HEMATOCRIT, BLOOD
HCT: 23.5 % — ABNORMAL LOW (ref 36.0–46.0)
HCT: 24.2 % — ABNORMAL LOW (ref 36.0–46.0)
Hemoglobin: 7.5 g/dL — ABNORMAL LOW (ref 12.0–15.0)
Hemoglobin: 7.8 g/dL — ABNORMAL LOW (ref 12.0–15.0)

## 2021-06-15 LAB — CBC WITH DIFFERENTIAL/PLATELET
Abs Immature Granulocytes: 0.01 10*3/uL (ref 0.00–0.07)
Basophils Absolute: 0 10*3/uL (ref 0.0–0.1)
Basophils Relative: 1 %
Eosinophils Absolute: 0.2 10*3/uL (ref 0.0–0.5)
Eosinophils Relative: 4 %
HCT: 26.5 % — ABNORMAL LOW (ref 36.0–46.0)
Hemoglobin: 8.3 g/dL — ABNORMAL LOW (ref 12.0–15.0)
Immature Granulocytes: 0 %
Lymphocytes Relative: 26 %
Lymphs Abs: 1.2 10*3/uL (ref 0.7–4.0)
MCH: 26.5 pg (ref 26.0–34.0)
MCHC: 31.3 g/dL (ref 30.0–36.0)
MCV: 84.7 fL (ref 80.0–100.0)
Monocytes Absolute: 0.4 10*3/uL (ref 0.1–1.0)
Monocytes Relative: 8 %
Neutro Abs: 2.9 10*3/uL (ref 1.7–7.7)
Neutrophils Relative %: 61 %
Platelets: 231 10*3/uL (ref 150–400)
RBC: 3.13 MIL/uL — ABNORMAL LOW (ref 3.87–5.11)
RDW: 13.4 % (ref 11.5–15.5)
WBC: 4.7 10*3/uL (ref 4.0–10.5)
nRBC: 0 % (ref 0.0–0.2)

## 2021-06-15 LAB — RESP PANEL BY RT-PCR (FLU A&B, COVID) ARPGX2
Influenza A by PCR: NEGATIVE
Influenza B by PCR: NEGATIVE
SARS Coronavirus 2 by RT PCR: NEGATIVE

## 2021-06-15 LAB — HEPATIC FUNCTION PANEL
ALT: 13 U/L (ref 0–44)
AST: 21 U/L (ref 15–41)
Albumin: 4 g/dL (ref 3.5–5.0)
Alkaline Phosphatase: 49 U/L (ref 38–126)
Bilirubin, Direct: 0.1 mg/dL (ref 0.0–0.2)
Indirect Bilirubin: 0.3 mg/dL (ref 0.3–0.9)
Total Bilirubin: 0.4 mg/dL (ref 0.3–1.2)
Total Protein: 7.5 g/dL (ref 6.5–8.1)

## 2021-06-15 LAB — CBC
HCT: 22.7 % — ABNORMAL LOW (ref 36.0–46.0)
Hemoglobin: 7.1 g/dL — ABNORMAL LOW (ref 12.0–15.0)
MCH: 26.8 pg (ref 26.0–34.0)
MCHC: 31.3 g/dL (ref 30.0–36.0)
MCV: 85.7 fL (ref 80.0–100.0)
Platelets: 203 10*3/uL (ref 150–400)
RBC: 2.65 MIL/uL — ABNORMAL LOW (ref 3.87–5.11)
RDW: 13.5 % (ref 11.5–15.5)
WBC: 8.3 10*3/uL (ref 4.0–10.5)
nRBC: 0 % (ref 0.0–0.2)

## 2021-06-15 LAB — CBG MONITORING, ED: Glucose-Capillary: 77 mg/dL (ref 70–99)

## 2021-06-15 LAB — TROPONIN I (HIGH SENSITIVITY): Troponin I (High Sensitivity): 4 ng/L (ref <18)

## 2021-06-15 LAB — PREPARE RBC (CROSSMATCH)

## 2021-06-15 LAB — OCCULT BLOOD X 1 CARD TO LAB, STOOL: Fecal Occult Bld: POSITIVE — AB

## 2021-06-15 LAB — PROTIME-INR
INR: 2.3 — ABNORMAL HIGH (ref 0.8–1.2)
Prothrombin Time: 25 seconds — ABNORMAL HIGH (ref 11.4–15.2)

## 2021-06-15 MED ORDER — METFORMIN HCL 500 MG PO TABS: 500.0000 mg | ORAL_TABLET | Freq: Two times a day (BID) | ORAL | Status: DC

## 2021-06-15 MED ORDER — IOHEXOL 350 MG/ML SOLN
75.0000 mL | Freq: Once | INTRAVENOUS | Status: AC | PRN
  Administered 2021-06-15: 75 mL via INTRAVENOUS

## 2021-06-15 MED ORDER — METOPROLOL SUCCINATE ER 50 MG PO TB24: 50.0000 mg | ORAL_TABLET | Freq: Every day | ORAL | Status: DC

## 2021-06-15 MED ORDER — GABAPENTIN 300 MG PO CAPS: 400.0000 mg | ORAL_CAPSULE | Freq: Every day | ORAL | Status: DC

## 2021-06-15 MED ORDER — AMLODIPINE BESYLATE 5 MG PO TABS: 10.0000 mg | ORAL_TABLET | Freq: Every day | ORAL | Status: DC

## 2021-06-15 MED ORDER — PRAVASTATIN SODIUM 40 MG PO TABS
40.0000 mg | ORAL_TABLET | Freq: Every day | ORAL | Status: DC
  Filled 2021-06-15: qty 1

## 2021-06-15 MED ORDER — DONEPEZIL HCL 10 MG PO TABS
10.0000 mg | ORAL_TABLET | Freq: Every day | ORAL | Status: DC
  Filled 2021-06-15: qty 1

## 2021-06-15 MED ORDER — POLYVINYL ALCOHOL 1.4 % OP SOLN
1.0000 [drp] | Freq: Every day | OPHTHALMIC | Status: DC
  Administered 2021-06-16 – 2021-06-17 (×2): 1 [drp] via OPHTHALMIC
  Filled 2021-06-15: qty 15

## 2021-06-15 MED ORDER — SODIUM CHLORIDE 0.9 % IV SOLN: 10.0000 mL/h | Freq: Once | INTRAVENOUS | Status: DC

## 2021-06-15 MED ORDER — LOSARTAN POTASSIUM 25 MG PO TABS: 100.0000 mg | ORAL_TABLET | Freq: Every day | ORAL | Status: DC

## 2021-06-15 MED ORDER — PANTOPRAZOLE SODIUM 40 MG IV SOLR
40.0000 mg | Freq: Two times a day (BID) | INTRAVENOUS | Status: DC
  Administered 2021-06-16 – 2021-06-17 (×4): 40 mg via INTRAVENOUS
  Filled 2021-06-15 (×4): qty 40

## 2021-06-15 MED ORDER — ONDANSETRON HCL 4 MG PO TABS: 4.0000 mg | ORAL_TABLET | Freq: Four times a day (QID) | ORAL | Status: DC | PRN

## 2021-06-15 MED ORDER — SODIUM CHLORIDE 0.9 % IV SOLN
10.0000 mL/h | Freq: Once | INTRAVENOUS | Status: AC
Start: 2021-06-15 — End: 2021-06-16
  Administered 2021-06-16: 10 mL/h via INTRAVENOUS

## 2021-06-15 MED ORDER — INSULIN ASPART 100 UNIT/ML IJ SOLN
0.0000 [IU] | INTRAMUSCULAR | Status: DC
  Administered 2021-06-16: 5 [IU] via SUBCUTANEOUS
  Administered 2021-06-17: 2 [IU] via SUBCUTANEOUS
  Filled 2021-06-15: qty 0.09

## 2021-06-15 MED ORDER — ONDANSETRON HCL 4 MG/2ML IJ SOLN
4.0000 mg | Freq: Four times a day (QID) | INTRAMUSCULAR | Status: DC | PRN
  Administered 2021-06-17: 4 mg via INTRAVENOUS
  Filled 2021-06-15: qty 2

## 2021-06-15 MED ORDER — POLYETHYL GLYCOL-PROPYL GLYCOL 0.4-0.3 % OP SOLN: 1.0000 [drp] | Freq: Every day | OPHTHALMIC | Status: DC

## 2021-06-15 MED ORDER — SODIUM CHLORIDE 0.9% FLUSH
3.0000 mL | Freq: Two times a day (BID) | INTRAVENOUS | Status: DC
  Administered 2021-06-16 (×3): 3 mL via INTRAVENOUS

## 2021-06-15 NOTE — ED Notes (Signed)
Pts husband came to the doorway. This nurse asked husband if he needed assistance. Husband stating "I know you all are busy I got it." Pt ambulatory to restroom with her husband. Steady gait, NAD.

## 2021-06-15 NOTE — ED Notes (Signed)
Transported to CT 

## 2021-06-15 NOTE — ED Provider Notes (Signed)
[facility]PARTMENT Provider Note   CSN: [id number] Arrival date & time: 06/15/21  1048     History Chief Complaint  Patient presents with   Rectal Bleeding    Stephanie Barber is a 80 y.o. female presenting to emergency department with concern of blood in stool.  Patient reports past 24 hours she has had several loose, frankly bloody bowel movements with a sense of urgency.  She had a similar episode several months ago which resolved on its own.  She does have a history of diverticulosis.  She does take Xarelto.  She reports that she felt lightheaded and weak today.  She denies shortness of breath.  She showed me a photo of her bloody bowel movement which shows a combination of bright red blood in the toilet bowl as well as dark blood.  Her last colonoscopy was in 2012, which showed diverticulosis of the entire colon, and 2 polyps which were removed.  HPI     Past Medical History:  Diagnosis Date   Arthritis    Chronic headache    Complication of anesthesia    allergy to Novocaine    Diabetes mellitus    GERD (gastroesophageal reflux disease)    Heart murmur    High cholesterol    Hyperlipidemia    Hypertension    Intracranial atherosclerosis    per MRA   Migraine    Narcolepsy and cataplexy    OSA on CPAP    states has not used CPAP in over a year   Rectal prolapse    Trigger finger of all digits of right hand     Patient Active Problem List   Diagnosis Date Noted   Left-sided headache 06/30/2019   Memory loss 06/30/2019   AKI (acute kidney injury) ([place]) 05/06/2017   Dehydration 05/06/2017   Chest pain 05/06/2017   Wound infection after surgery 01/30/2017   Palpitations 08/24/2016   Noncompliance with CPAP treatment 12/28/2015   Poor compliance with CPAP treatment 10/26/2014   CPAP use counseling 07/28/2014   OSA on CPAP 07/28/2014   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  growth removed   rectal prolapse repair  12/05/11   ROTATOR CUFF REPAIR     Left   SHOULDER ARTHROSCOPY WITH SUBACROMIAL DECOMPRESSION, ROTATOR CUFF REPAIR AND BICEP TENDON REPAIR Left 05/25/2015   Procedure: SHOULDER ARTHROSCOPY WITH SUBACROMIAL DECOMPRESSION, ROTATOR CUFF REPAIR AND BICEP TENODESIS, DEBRIDEMENT. ;  Surgeon: Meredith Pel, MD;  Location: [facility];  Service: Orthopedics;  Laterality: Left;  LEFT SHOULDER ROTATOR CUFF TEAR REPAIR, ARTHROSCOPY, DEBRIDEMENT, BICEPS TENODESIS, SUBACROMIAL DECOMPRESSION.   TOTAL ABDOMINAL HYSTERECTOMY     TRIGGER FINGER RELEASE Right 04/30/2018   Procedure: RELEASE TRIGGER FINGER/A-1 PULLEY RIGHT MIDDLE  AND SMALLn ;  Surgeon: Daryll Brod, MD;  Location: [facility];  Service: Orthopedics;  Laterality: Right;  FAB, Bier block   TUBAL LIGATION       OB History   No  obstetric history on file.     Family History  Problem Relation Age of Onset   Heart disease Mother    Throat cancer Father    Cancer Father        stomach   Kidney cancer Brother    Heart disease Brother     Social History   Tobacco Use   Smoking status: Former    Types: Cigarettes    Quit date: 11/13/1980    Years since quitting: 40.6   Smokeless tobacco: Never   Tobacco comments:    quit in 1982  Substance Use Topics   Alcohol use: No    Alcohol/week: 0.0 standard drinks   Drug use: No    Home Medications Prior to Admission medications   Medication Sig Start Date End Date Taking? Authorizing  Provider  amLODipine (NORVASC) 10 MG tablet Take 10 mg by mouth daily after breakfast.    [provider]  aspirin EC 81 MG tablet Take 81 mg by mouth at bedtime.    [provider]  Cinnamon 500 MG capsule Take 500 mg by mouth daily.    [provider]  donepezil (ARICEPT) 10 MG tablet Take 1 tablet (10 mg total) by mouth at bedtime. 01/03/21   [name], NP  fish oil-omega-3 fatty acids 1000 MG capsule Take 1 g by mouth daily.    [provider]  gabapentin (NEURONTIN) 400 MG capsule Take 400 mg by mouth at bedtime.    [provider]  losartan (COZAAR) 100 MG tablet Take 100 mg by mouth daily after breakfast.    [provider]  metFORMIN (GLUCOPHAGE) 500 MG tablet Take 500 mg by mouth 2 (two) times daily with a meal.    [provider]  metoprolol (TOPROL-XL) 50 MG 24 hr tablet Take 50 mg by mouth daily after breakfast.    [provider]  Polyethyl Glycol-Propyl Glycol 0.4-0.3 % SOLN Place 1 drop into both eyes daily.    [provider]  pravastatin (PRAVACHOL) 40 MG tablet Take 40 mg by mouth at bedtime.    [provider]  rivaroxaban (XARELTO) 20 MG TABS tablet Take 20 mg by mouth daily with supper.    [provider]    Allergies    Morphine and related  Review of Systems   Review of Systems  Constitutional:  Negative for chills and fever.  Eyes:  Negative for pain and visual disturbance.  Respiratory:  Positive for shortness of breath. Negative for cough.   Cardiovascular:  Negative for chest pain and palpitations.  Gastrointestinal:  Negative for abdominal pain and vomiting.  Genitourinary:  Negative for dysuria and hematuria.  Musculoskeletal:  Negative for arthralgias and back pain.  Skin:  Negative for color change and rash.  Neurological:  Positive for light-headedness. Negative for syncope.  All other systems reviewed and are negative.  Physical Exam Updated Vital  Signs BP (!) 176/68 (BP Location: Left Arm)   Pulse 65   Temp 98.6 F (37 C)   Resp 18   Ht '4\' 11"'$  (1.499 m)   Wt 48.5 kg   SpO2 99%   BMI 21.61 kg/m   Physical Exam Constitutional:      General: She is not in acute distress. HENT:     Head: Normocephalic and atraumatic.  Eyes:     Conjunctiva/sclera: Conjunctivae normal.     Pupils: Pupils are equal, round, and reactive to light.  Cardiovascular:     Rate and  Rhythm: Normal rate and regular rhythm.     Pulses: Normal pulses.  Pulmonary:     Effort: Pulmonary effort is normal. No respiratory distress.  Abdominal:     General: There is no distension.     Tenderness: There is no abdominal tenderness.  Skin:    General: Skin is warm and dry.  Neurological:     General: No focal deficit present.     Mental Status: She is alert. Mental status is at baseline.  Psychiatric:        Mood and Affect: Mood normal.        Behavior: Behavior normal.    ED Results / Procedures / Treatments   Labs (all labs ordered are listed, but only abnormal results are displayed) Labs Reviewed - No data to display  EKG None  Radiology No results found.  Procedures Procedures   Medications Ordered in ED Medications - No data to display  ED Course  I have reviewed the triage vital signs and the nursing notes.  Pertinent labs & imaging results that were available during my care of the patient were reviewed by me and considered in my medical decision making (see chart for details).  Patient is here with suspected GI bleed.  She is on Xarelto.  Her vitals are stable and she is well-appearing otherwise.  This appears to be melanotic and some bright red blood component, and I strongly suspect it is a diverticular bleed given that she has extensive diverticulosis.  She had COVID she says about 3 to 4 months ago.  She is also fully vaccinated.  This seems less likely to be a case of COVID, but we can retest her at this time.  We will check  her labs here  Labs reviewed - minor drop in hgb, but unclear how quickly this is trending down, as last level was 3 months ago.  INR 2.3 BMP at baseline, BUN normal.  On reassessment her vitals remain stable, she is minimally symptomatic - no indication for immediate transfusion.  She will be admitted to the hospital for GI evaluation.  Holding Xarelto at this time.  Clinical Course as of 06/15/21 1700  Wed Jun 15, 2021  1337 With her drop in hemoglobin level and her being on Xarelto, I will admit the patient to the hospitalist.  GI doc is Dr Annette Stable [facility]. I've messaged the [facility] on call GI provider [MT]  [id number] Dr Michail Sermon notified, will consult on patient inpatient, rec NPO at [facility] and hold xarelto.  Hospitalist paged for admission. [MT]    Clinical Course User Index [MT] Teralyn Mullins, Carola Rhine, MD     Final Clinical Impression(s) / ED Diagnoses GI bleed   Rx / DC Orders ED Discharge Orders     None        Wyvonnia Dusky, MD 06/15/21 1701

## 2021-06-15 NOTE — ED Triage Notes (Signed)
Pt c/o 3 episodes of rectal bleeding started yesterday-NAD-slow steady gait

## 2021-06-15 NOTE — ED Notes (Signed)
Pt acknowledges that she was ambulated to bathroom by husband, and fell while in bathroom. Pt reports husband left patient in bathroom. Pt reports no memory of actual fall, and pt was found on floor in bathroom by tech Edison Nasuti. This RN, Megan,RN and, Britni PA and EDP Dykstra examined patient in bathroom floor immediately upon notification that patient fell. C-collar was applied, and patient was moved to stretcher and returned to patient room. Pt is AOx4, reports frontal head pain, denies neck pain or numbness. Pt has no visible skin injury, denies CP.

## 2021-06-15 NOTE — ED Provider Notes (Signed)
Update note  80 year old admitted to hospitalist service with GI consult for suspected GI bleed.  Patient is on Xarelto.  Last dose yesterday evening.  Also evaluate patient.  She had passed out in the bathroom and fell. Obvious large blood per rectum. BP is stable. Placed in C collar, transported to bed. BP and HR are stable.  No obvious signs of trauma on my exam.  Patient still does not have assigned bed at [facility].  Believe she would strongly benefit from urgent transfer due to her concern for potential decompensation and need for specialty services and blood transfusion.  I discussed case with Dr. Kathrynn Humble.  He is accepting ER to ER at [facility].  Notified Dr. Cyndia Skeeters who was the original admitting, accepting doctor.  Plan:  Repeat H/H Cbg EKG CT head/C spine CTA Abd/pelv   Hgb is 7.8, unchanged from a couple hours ago, BP remains stable, will hold blood transfusion for now. Updated patient, husband and daughter via phone.    CRITICAL CARE Performed by: Lucrezia Starch   Total critical care time: 35 minutes  Critical care time was exclusive of separately billable procedures and treating other patients.  Critical care was necessary to treat or prevent imminent or life-threatening deterioration.  Critical care was time spent personally by me on the following activities: development of treatment plan with patient and/or surrogate as well as nursing, discussions with consultants, evaluation of patient's response to treatment, examination of patient, obtaining history from patient or surrogate, ordering and performing treatments and interventions, ordering and review of laboratory studies, ordering and review of radiographic studies, pulse oximetry and re-evaluation of patient's condition.     Lucrezia Starch, MD 06/15/21 [phone]

## 2021-06-15 NOTE — H&P (Signed)
History and Physical    Stephanie Barber [id number] DOB: July 08, 1941 DOA: 06/15/2021  PCP: [name], MD  Patient coming from: Home  I have personally briefly reviewed patient's old medical records in [place]  Chief Complaint: Rectal bleeding, lightheadedness, weakness  HPI: Stephanie Barber is a 80 y.o. female with medical history significant for history of DVT on Xarelto, T2DM, HTN, HLD, GERD, diverticulosis who presented to [place] ED for evaluation of rectal bleeding.  Patient reported new onset of rectal bleeding occurring morning of 06/15/2021.  Bleeding was described as dark red with some black stool appearance.  Patient states she has not been having any abdominal pain, lightheadedness, or dizziness.  She came to the ED for further evaluation.  While in the ED she went to the restroom to urinate and ended up having a large bloody bowel movement.  She became diaphoretic, dizzy, and then experienced a syncopal episode.  She was found on the floor by staff.  She developed a small contusion to her upper forehead.  Follow-up CT head/cervical spine were reassuring.   Patient states she has been on Xarelto for prior DVT.  Last dose was taken 8/2.  She says she had a similar bleeding episode at the beginning of this year with intermittent bleeding since then.  ED Course:  Initial vitals showed BP 176/68, pulse 65, RR 18, temp 98.6 F, SPO2 95% on room air.  Labs show hemoglobin 8.3 (baseline 10-11), WBC 4.7, platelets 221,000, INR 2.3, sodium 139, potassium 4.5, bicarb 24, BUN 23, creatinine 1.22, serum glucose 87, high-sensitivity troponin I 4, LFTs within normal limits.  SARS-CoV-2 PCR panel negative.  FOBT positive.  Repeat hemoglobin 7.5.  While in [place] ED patient had reported syncopal episode in the bathroom following a large bloody bowel movement.  She was found lying on the floor.  CT head and cervical spine without contrast were obtained  and negative for acute intracranial abnormality or acute displaced fracture or traumatic listhesis of the cervical spine.  CTA abdomen/pelvis was obtained without radiographic findings of active GI hemorrhage.  Colonic diverticulosis without evidence of acute diverticulitis and prior sigmoid resection noted.  Nonobstructive 7 mm left nephrolithiasis seen.  Patient was transferred ED to ED for expedited management.  [facility] were consulted earlier and will see patient in the morning.  She has been ordered to receive 1 unit PRBC transfusion.  The hospitalist service was consulted to admit for further management.  Review of Systems: All systems reviewed and are negative except as documented in history of present illness above.   Past Medical History:  Diagnosis Date   Arthritis    Chronic headache    Complication of anesthesia    allergy to Novocaine    Diabetes mellitus    GERD (gastroesophageal reflux disease)    Heart murmur    High cholesterol    Hyperlipidemia    Hypertension    Intracranial atherosclerosis    per MRA   Migraine    Narcolepsy and cataplexy    OSA on CPAP    states has not used CPAP in over a year   Rectal prolapse    Trigger finger of all digits of right hand     Past Surgical History:  Procedure Laterality Date   ABDOMINAL HYSTERECTOMY     BACK SURGERY     L4,L5 discectomy   CARDIAC CATHETERIZATION     CARPAL TUNNEL RELEASE Right 01/18/2017   KIDNEY  SURGERY  1998   Right.  growth removed   rectal prolapse repair  12/05/11   ROTATOR CUFF REPAIR     Left   SHOULDER ARTHROSCOPY WITH SUBACROMIAL DECOMPRESSION, ROTATOR CUFF REPAIR AND BICEP TENDON REPAIR Left 05/25/2015   Procedure: SHOULDER ARTHROSCOPY WITH SUBACROMIAL DECOMPRESSION, ROTATOR CUFF REPAIR AND BICEP TENODESIS, DEBRIDEMENT. ;  Surgeon: Meredith Pel, MD;  Location: [place];  Service: Orthopedics;  Laterality: Left;  LEFT SHOULDER ROTATOR CUFF TEAR REPAIR, ARTHROSCOPY, DEBRIDEMENT, BICEPS  TENODESIS, SUBACROMIAL DECOMPRESSION.   TOTAL ABDOMINAL HYSTERECTOMY     TRIGGER FINGER RELEASE Right 04/30/2018   Procedure: RELEASE TRIGGER FINGER/A-1 PULLEY RIGHT MIDDLE  AND SMALLn ;  Surgeon: Daryll Brod, MD;  Location: [facility];  Service: Orthopedics;  Laterality: Right;  FAB, Bier block   TUBAL LIGATION      Social History:  reports that she quit smoking about 40 years ago. She has never used smokeless tobacco. She reports that she does not drink alcohol and does not use drugs.  Allergies  Allergen Reactions   Morphine And Related Anaphylaxis    Family History  Problem Relation Age of Onset   Heart disease Mother    Throat cancer Father    Cancer Father        stomach   Kidney cancer Brother    Heart disease Brother      Prior to Admission medications   Medication Sig Start Date End Date Taking? Authorizing Provider  acetaminophen (TYLENOL) 500 MG tablet Take 500 mg by mouth every 6 (six) hours as needed for moderate pain.   Yes [provider]  amLODipine (NORVASC) 10 MG tablet Take 10 mg by mouth daily after breakfast.   Yes [provider]  aspirin EC 81 MG tablet Take 81 mg by mouth at bedtime.   Yes [provider]  celecoxib (CELEBREX) 200 MG capsule Take 200 mg by mouth daily.   Yes [provider]  Cinnamon 500 MG capsule Take 500 mg by mouth daily.   Yes [provider]  diazepam (VALIUM) 2 MG tablet Take 1 mg by mouth at bedtime. 02/28/21  Yes [provider]  fish oil-omega-3 fatty acids 1000 MG capsule Take 1 g by mouth daily.   Yes [provider]  gabapentin (NEURONTIN) 400 MG capsule Take 400 mg by mouth at bedtime.   Yes [provider]  loperamide (IMODIUM A-D) 2 MG tablet Take 2 mg by mouth 4 (four) times daily as needed for diarrhea or loose stools.   Yes [provider]  losartan (COZAAR) 100 MG tablet Take 100 mg by mouth daily after breakfast.   Yes  [provider]  metFORMIN (GLUCOPHAGE) 500 MG tablet Take 500 mg by mouth 2 (two) times daily with a meal.   Yes [provider]  metoprolol (TOPROL-XL) 50 MG 24 hr tablet Take 50 mg by mouth daily after breakfast.   Yes [provider]  Polyethyl Glycol-Propyl Glycol 0.4-0.3 % SOLN Place 1 drop into both eyes daily.   Yes [provider]  pravastatin (PRAVACHOL) 40 MG tablet Take 40 mg by mouth at bedtime.   Yes [provider]  rivaroxaban (XARELTO) 20 MG TABS tablet Take 20 mg by mouth daily with supper.   Yes [provider]  sertraline (ZOLOFT) 25 MG tablet Take 25 mg by mouth at bedtime. 02/28/21  Yes [provider]  traMADol (ULTRAM) 50 MG tablet Take 50 mg by mouth daily as needed  for moderate pain.   Yes [provider]  donepezil (ARICEPT) 10 MG tablet Take 1 tablet (10 mg total) by mouth at bedtime. Patient not taking: No sig reported 01/03/21   [name], NP    Physical Exam: Vitals:   06/15/21 1900 06/15/21 1945 06/15/21 2100 06/15/21 2130  BP: (!) 181/74 (!) 187/80 (!) 153/80 (!) 157/76  Pulse: 68 (!) 59 70 75  Resp: '20 10 15 15  '$ Temp:  98.4 F (36.9 C)    TempSrc:  Oral    SpO2: 100% 100% 100% 100%  Weight:      Height:       Constitutional: Resting in bed, NAD, calm, comfortable Head: Circular contusion right upper forehead near the scalp line without open wound or bruising. Eyes: PERRL, lids and conjunctivae normal ENMT: Mucous membranes are moist. Posterior pharynx clear of any exudate or lesions.Normal dentition.  Neck: normal, supple, no masses. Respiratory: clear to auscultation bilaterally, no wheezing, no crackles. Normal respiratory effort. No accessory muscle use.  Cardiovascular: Regular rate and rhythm, no murmurs / rubs / gallops. No extremity edema. 2+ pedal pulses. Abdomen: no tenderness, no masses palpated. No hepatosplenomegaly. Bowel sounds positive.  Musculoskeletal: no  clubbing / cyanosis. No joint deformity upper and lower extremities. Good ROM, no contractures. Normal muscle tone.  Skin: Diaphoretic, no rashes, lesions, ulcers. No induration Neurologic: CN 2-12 grossly intact. Sensation intact. Strength 5/5 in all 4.  Psychiatric: Normal judgment and insight. Alert and oriented x 3. Normal mood.   Labs on Admission: I have personally reviewed following labs and imaging studies  CBC: Recent Labs  Lab 06/15/21 1237 06/15/21 1506 06/15/21 1858  WBC 4.7  --   --   NEUTROABS 2.9  --   --   HGB 8.3* 7.5* 7.8*  HCT 26.5* 23.5* 24.2*  MCV 84.7  --   --   PLT 231  --   --    Basic Metabolic Panel: Recent Labs  Lab 06/15/21 1237  NA 139  K 4.5  CL 106  CO2 24  GLUCOSE 87  BUN 23  CREATININE 1.22*  CALCIUM 9.1   GFR: Estimated Creatinine Clearance: 24.7 mL/min (A) (by C-G formula based on SCr of 1.22 mg/dL (H)). Liver Function Tests: Recent Labs  Lab 06/15/21 1237  AST 21  ALT 13  ALKPHOS 49  BILITOT 0.4  PROT 7.5  ALBUMIN 4.0   No results for input(s): LIPASE, AMYLASE in the last 168 hours. No results for input(s): AMMONIA in the last 168 hours. Coagulation Profile: Recent Labs  Lab 06/15/21 1237  INR 2.3*   Cardiac Enzymes: No results for input(s): CKTOTAL, CKMB, CKMBINDEX, TROPONINI in the last 168 hours. BNP (last 3 results) No results for input(s): PROBNP in the last 8760 hours. HbA1C: No results for input(s): HGBA1C in the last 72 hours. CBG: Recent Labs  Lab 06/15/21 1950  GLUCAP 77   Lipid Profile: No results for input(s): CHOL, HDL, LDLCALC, TRIG, CHOLHDL, LDLDIRECT in the last 72 hours. Thyroid Function Tests: No results for input(s): TSH, T4TOTAL, FREET4, T3FREE, THYROIDAB in the last 72 hours. Anemia Panel: No results for input(s): VITAMINB12, FOLATE, FERRITIN, TIBC, IRON, RETICCTPCT in the last 72 hours. Urine analysis:    Component Value Date/Time   COLORURINE STRAW (A) 03/10/2021 1432   APPEARANCEUR  CLEAR 03/10/2021 1432   LABSPEC 1.009 03/10/2021 1432   PHURINE 5.0 03/10/2021 1432   GLUCOSEU NEGATIVE 03/10/2021 1432   HGBUR NEGATIVE 03/10/2021 1432  BILIRUBINUR NEGATIVE 03/10/2021 [place] 03/10/2021 1432   PROTEINUR NEGATIVE 03/10/2021 1432   UROBILINOGEN 0.2 05/20/2009 1405   NITRITE NEGATIVE 03/10/2021 1432   LEUKOCYTESUR MODERATE (A) 03/10/2021 1432    Radiological Exams on Admission: CT HEAD WO CONTRAST (5MM)  Result Date: 06/15/2021 CLINICAL DATA:  Neck pain, acute, no red flags fall EXAM: CT HEAD WITHOUT CONTRAST CT CERVICAL SPINE WITHOUT CONTRAST TECHNIQUE: Multidetector CT imaging of the head and cervical spine was performed following the standard protocol without intravenous contrast. Multiplanar CT image reconstructions of the cervical spine were also generated. COMPARISON:  MR head 03/10/2021, CT head 03/10/2021 FINDINGS: CT HEAD FINDINGS Brain: Patchy and confluent areas of decreased attenuation are noted throughout the deep and periventricular white matter of the cerebral hemispheres bilaterally, compatible with chronic microvascular ischemic disease. No evidence of large-territorial acute infarction. No parenchymal hemorrhage. No mass lesion. No extra-axial collection. No mass effect or midline shift. No hydrocephalus. Basilar cisterns are patent. Vascular: No hyperdense vessel. Skull: No acute fracture or focal lesion. Sinuses/Orbits: Paranasal sinuses and mastoid air cells are clear. Bilateral lens replacement. Otherwise the orbits are unremarkable. Other: None. CT CERVICAL SPINE FINDINGS Alignment: Normal. Skull base and vertebrae: Multilevel degenerative changes of the spine. No acute fracture. No aggressive appearing focal osseous lesion or focal pathologic process. Soft tissues and spinal canal: No prevertebral fluid or swelling. No visible canal hematoma. Upper chest: Unremarkable. Other: Partially visualized hypodensity and calcification within the right  thyroid gland. IMPRESSION: 1. No acute intracranial abnormality. 2. No acute displaced fracture or traumatic listhesis of the cervical spine. Electronically Signed   By: [name] M.D.   On: 06/15/2021 20:04   CT Cervical Spine Wo Contrast  Result Date: 06/15/2021 CLINICAL DATA:  Neck pain, acute, no red flags fall EXAM: CT HEAD WITHOUT CONTRAST CT CERVICAL SPINE WITHOUT CONTRAST TECHNIQUE: Multidetector CT imaging of the head and cervical spine was performed following the standard protocol without intravenous contrast. Multiplanar CT image reconstructions of the cervical spine were also generated. COMPARISON:  MR head 03/10/2021, CT head 03/10/2021 FINDINGS: CT HEAD FINDINGS Brain: Patchy and confluent areas of decreased attenuation are noted throughout the deep and periventricular white matter of the cerebral hemispheres bilaterally, compatible with chronic microvascular ischemic disease. No evidence of large-territorial acute infarction. No parenchymal hemorrhage. No mass lesion. No extra-axial collection. No mass effect or midline shift. No hydrocephalus. Basilar cisterns are patent. Vascular: No hyperdense vessel. Skull: No acute fracture or focal lesion. Sinuses/Orbits: Paranasal sinuses and mastoid air cells are clear. Bilateral lens replacement. Otherwise the orbits are unremarkable. Other: None. CT CERVICAL SPINE FINDINGS Alignment: Normal. Skull base and vertebrae: Multilevel degenerative changes of the spine. No acute fracture. No aggressive appearing focal osseous lesion or focal pathologic process. Soft tissues and spinal canal: No prevertebral fluid or swelling. No visible canal hematoma. Upper chest: Unremarkable. Other: Partially visualized hypodensity and calcification within the right thyroid gland. IMPRESSION: 1. No acute intracranial abnormality. 2. No acute displaced fracture or traumatic listhesis of the cervical spine. Electronically Signed   By: [name] M.D.   On: 06/15/2021  20:04   CT Angio Abd/Pel w/ and/or w/o  Result Date: 06/15/2021 CLINICAL DATA:  80 y/o female having frank bloody stools.  GI bleed EXAM: CTA ABDOMEN AND PELVIS WITHOUT AND WITH CONTRAST TECHNIQUE: Multidetector CT imaging of the abdomen and pelvis was performed using the standard protocol during bolus administration of intravenous contrast. Multiplanar reconstructed images and MIPs were obtained and reviewed  to evaluate the vascular anatomy. CONTRAST:  37m OMNIPAQUE IOHEXOL 350 MG/ML SOLN COMPARISON:  None. FINDINGS: VASCULAR Aorta: Moderate atherosclerotic plaque. Normal caliber aorta without aneurysm, dissection, vasculitis or significant stenosis. Celiac: Patent without evidence of aneurysm, dissection, vasculitis or significant stenosis. SMA: Patent without evidence of aneurysm, dissection, vasculitis or significant stenosis. Renals: Mild to moderate atherosclerotic plaque. Both renal arteries are patent without evidence of aneurysm, dissection, vasculitis, fibromuscular dysplasia or significant stenosis. IMA: Mild atherosclerotic plaque. Patent without evidence of aneurysm, dissection, vasculitis or significant stenosis. Inflow: Patent without evidence of aneurysm, dissection, vasculitis or significant stenosis. Proximal Outflow: Bilateral common femoral and visualized portions of the superficial and profunda femoral arteries are patent without evidence of aneurysm, dissection, vasculitis or significant stenosis. Veins: No obvious venous abnormality within the limitations of this arterial phase study. Review of the MIP images confirms the above findings. NON-VASCULAR Lower chest: Bilateral lower lobe subsegmental atelectasis. Hepatobiliary: Subcentimeter hypodensity too small to characterize. No focal liver abnormality. No gallstones, gallbladder wall thickening, or pericholecystic fluid. No biliary dilatation. Pancreas: No focal lesion. Normal pancreatic contour. No surrounding inflammatory changes. No  main pancreatic ductal dilatation. Spleen: Normal in size without focal abnormality. Adrenals/Urinary Tract: No adrenal nodule bilaterally. Bilateral kidneys enhance symmetrically. Calcified stone 7 mm in the left kidney. Pericentimeter fluid density lesions within the kidneys likely represent simple renal cysts. No hydronephrosis. No hydroureter. The urinary bladder is unremarkable. Stomach/Bowel: Stomach is within normal limits. No evidence of bowel wall thickening or dilatation. No intravenous contrast extravasation into the lumen of the small or large bowel identified. Diffuse colonic diverticulosis. Appendix appears normal. Lymphatic: No lymphadenopathy. Reproductive: Status post hysterectomy. No adnexal masses. Other: No intraperitoneal free fluid. No intraperitoneal free gas. No organized fluid collection. Musculoskeletal: No abdominal wall hernia or abnormality. No suspicious lytic or blastic osseous lesions. No acute displaced fracture. Multilevel degenerative changes of the spine. IMPRESSION: VASCULAR 1. No CT findings of active gastrointestinal hemorrhage. This is not exclude a bleed as this may be intermittent or too slow to be identified on CT. 2.  Aortic Atherosclerosis (ICD10-I70.0). NON-VASCULAR 1. Nonobstructive 7 mm left nephrolithiasis. 2. Scattered colonic diverticulosis with no acute diverticulitis. 3. Status post sigmoid resection. Electronically Signed   By: MIven FinnM.D.   On: 06/15/2021 20:27    EKG: Personally reviewed. Normal sinus rhythm without acute ischemic changes.  Assessment/Plan Principal Problem:   Acute GI bleeding Active Problems:   Type 2 diabetes mellitus (HCC)   Hypertension associated with diabetes ([place]   Hematochezia   Hyperlipidemia associated with type 2 diabetes mellitus ([place]   Stephanie MCATESSA KNOXis a 80y.o. female with medical history significant for history of DVT on Xarelto, T2DM, HTN, HLD, GERD, diverticulosis who is admitted with acute  symptomatic GI bleed.  Acute symptomatic GI bleed: Patient with acute brisk GI bleeding complicated by syncopal episode while in the ED.  Source is unclear.  Prior colonoscopy showed diverticulosis throughout the colon.  Hemoglobin trended down from 8.3 to 7.1. -Admit to stepdown unit given ongoing GI bleeding with syncopal episode -GI consulted, to see in a.m. -Transfusing 1 unit PRBC -Start IV Protonix 40 mg twice daily -Keep n.p.o. after midnight -Hold Xarelto  Syncopal episode: Vasovagal syncopal episode due to active GI bleed.  Continue management as above.  History of DVT: Hold Xarelto.  Type 2 diabetes: Hold home metformin.  Placed on sensitive SSI q4h while NPO.  Hypertension: Holding home antihypertensives for now due to risk of hypotension in setting  of active bleeding.  Hyperlipidemia: Pravastatin on hold while NPO.  DVT prophylaxis: SCDs Code Status: Full code, confirmed with patient Family Communication: Discussed with patient's husband at bedside Disposition Plan: From home, dispo pending clinical progress Consults called: Gastroenterology Level of care: Stepdown Admission status:  Status is: Observation  The patient remains OBS appropriate and will d/c before 2 midnights.  Dispo: The patient is from: Home              Anticipated d/c is to: Home              Patient currently is not medically stable to d/c.   Difficult to place patient No   Zada Finders MD [facility]  If 7PM-7AM, please contact night-coverage [email or web address]  06/15/2021, 10:39 PM

## 2021-06-15 NOTE — ED Notes (Addendum)
Patienth had syncopal episode in bathroom after large bloody b owel movement. Tech found patient lying on floor and immediately obtained Cspine.  Charge RN notified and Primary RN myself notified.   Patient began vomiting and rolled to side maintaining c spine and  became extremely diaphoretic and pale.  Patient continues to have bloody bowel movement after moving to bed.  C collar in place prior to moving patient. Patient complains of frontal headache.  Reports she thinks she fell forward. Linens changed and patient pericare performed.

## 2021-06-15 NOTE — ED Notes (Signed)
Checked CBG 77, Estate manager/land agent. Pt also given yellow sock

## 2021-06-15 NOTE — ED Provider Notes (Signed)
  Physical Exam  BP (!) 157/76   Pulse 75   Temp 98.4 F (36.9 C) (Oral)   Resp 15   Ht '4\' 11"'$  (1.499 m)   Wt 48.5 kg   SpO2 100%   BMI 21.61 kg/m   Physical Exam  ED Course/Procedures   Clinical Course as of 06/15/21 2240  Wed Jun 15, 2021  1337 With her drop in hemoglobin level and her being on Xarelto, I will admit the patient to the hospitalist.  GI doc is Dr Annet[facility]. I've messaged the [facility] on call GI provider [MT]  [id number] Dr Michail Sermon notified, will consult on patient inpatient, rec NPO at [facility] and hold xarelto.  Hospitalist paged for admission. [MT]    Clinical Course User Index [MT] Wyvonnia Dusky, MD    .Critical Care  Date/Time: 06/15/2021 10:40 PM Performed by: Varney Biles, MD Authorized by: Varney Biles, MD   Critical care provider statement:    Critical care time (minutes):  32   Critical care was necessary to treat or prevent imminent or life-threatening deterioration of the following conditions:  Respiratory failure   Critical care was time spent personally by me on the following activities:  Discussions with consultants, evaluation of patient's response to treatment, examination of patient, ordering and performing treatments and interventions, ordering and review of laboratory studies, ordering and review of radiographic studies, pulse oximetry, re-evaluation of patient's condition, obtaining history from patient or surrogate and review of old charts  MDM    Pt transferred here from [place].  She is going to need admission for acute symptomatic anemia from GI blood loss.  Patient is on Xarelto, last dose was yesterday.  She had a syncopal episode at [facility].  Her last hemoglobin was 7.8.  She is informs me that she still oozing out some blood and feeling cold.  Hemodynamically she is stable with no tachycardia or hypotension at this time.  However, given that she is on a DOAC, she had a syncope earlier, her hemoglobin  being 7.8 right now -I do think that she might benefit with the stepdown admission.  I have ordered 1 unit of PRBC for now.  Repeat CBC also ordered. No need for IR eval for now.  Spoke with hospitalist team and Dr. Posey Pronto will change her disposition to stepdown.       Varney Biles, MD 06/15/21 2242

## 2021-06-15 NOTE — ED Provider Notes (Signed)
Nursing notified me that patient with syncope while attempting to use the bathroom.  Attending Dr. Roslynn Amble to bed side. Did hit head, on anticoag. Patient placed in c collar. Imaging ordered.  Still having large amount of frank bloody stool here in ED.  Secretary to see about timing of bed placement for tx to [facility] given continuous bleeding and downgrading Hgb.   Agripina Guyette A, PA-C 06/15/21 1856    Lucrezia Starch, MD 06/18/21 2155

## 2021-06-15 NOTE — ED Notes (Signed)
Sitting on side of bed playing cards with husband.

## 2021-06-15 NOTE — ED Notes (Signed)
PT found in bathroom confused to surroundings. PT had blank stare and was lying across bathroom floor. This Tech immediatly called for backup while going down to obtain a secure C-Spine. Upon assessing PT airway fluid was heard in the PTs airway and advised surrounding staff to turn her to her side. PT slowly spit out a foamy mucus. PT became more alter when placing in C-collar and backboard. Attending Provider to PT side to assess. PT taken back to room and cleaned up, linen changed. MD at bedside to assess when returned to room. Fall reports initiated and this techs reported to charge to give account of what I observed. PT again reminded to ask for staff assistance and husband was educated again as well.

## 2021-06-15 NOTE — ED Notes (Signed)
C collar removed ok'd by Dr Reubin Milan.

## 2021-06-15 NOTE — ED Notes (Signed)
MD ok'd to hold off on blood transfusion due to hgb 7.8

## 2021-06-16 DIAGNOSIS — Z7901 Long term (current) use of anticoagulants: Secondary | ICD-10-CM | POA: Diagnosis not present

## 2021-06-16 DIAGNOSIS — K921 Melena: Secondary | ICD-10-CM | POA: Diagnosis not present

## 2021-06-16 DIAGNOSIS — E440 Moderate protein-calorie malnutrition: Secondary | ICD-10-CM | POA: Insufficient documentation

## 2021-06-16 DIAGNOSIS — D125 Benign neoplasm of sigmoid colon: Secondary | ICD-10-CM | POA: Diagnosis not present

## 2021-06-16 DIAGNOSIS — K922 Gastrointestinal hemorrhage, unspecified: Secondary | ICD-10-CM | POA: Diagnosis not present

## 2021-06-16 LAB — CBC
HCT: 30.1 % — ABNORMAL LOW (ref 36.0–46.0)
Hemoglobin: 9.7 g/dL — ABNORMAL LOW (ref 12.0–15.0)
MCH: 28.9 pg (ref 26.0–34.0)
MCHC: 32.2 g/dL (ref 30.0–36.0)
MCV: 89.6 fL (ref 80.0–100.0)
Platelets: 198 10*3/uL (ref 150–400)
RBC: 3.36 MIL/uL — ABNORMAL LOW (ref 3.87–5.11)
RDW: 14.1 % (ref 11.5–15.5)
WBC: 7.8 10*3/uL (ref 4.0–10.5)
nRBC: 0 % (ref 0.0–0.2)

## 2021-06-16 LAB — GLUCOSE, CAPILLARY
Glucose-Capillary: 73 mg/dL (ref 70–99)
Glucose-Capillary: 84 mg/dL (ref 70–99)
Glucose-Capillary: 128 mg/dL — ABNORMAL HIGH (ref 70–99)
Glucose-Capillary: 65 mg/dL — ABNORMAL LOW (ref 70–99)
Glucose-Capillary: 292 mg/dL — ABNORMAL HIGH (ref 70–99)
Glucose-Capillary: 101 mg/dL — ABNORMAL HIGH (ref 70–99)
Glucose-Capillary: 84 mg/dL (ref 70–99)

## 2021-06-16 LAB — BASIC METABOLIC PANEL
Anion gap: 12 (ref 5–15)
BUN: 22 mg/dL (ref 8–23)
CO2: 19 mmol/L — ABNORMAL LOW (ref 22–32)
Calcium: 9.3 mg/dL (ref 8.9–10.3)
Chloride: 109 mmol/L (ref 98–111)
Creatinine, Ser: 1.2 mg/dL — ABNORMAL HIGH (ref 0.44–1.00)
GFR, Estimated: 45 mL/min — ABNORMAL LOW (ref >60)
Glucose, Bld: 76 mg/dL (ref 70–99)
Potassium: 4.5 mmol/L (ref 3.5–5.1)
Sodium: 140 mmol/L (ref 135–145)

## 2021-06-16 LAB — PROTIME-INR
INR: 1.2 (ref 0.8–1.2)
Prothrombin Time: 14.9 seconds (ref 11.4–15.2)

## 2021-06-16 LAB — CBG MONITORING, ED: Glucose-Capillary: 80 mg/dL (ref 70–99)

## 2021-06-16 LAB — MRSA NEXT GEN BY PCR, NASAL: MRSA by PCR Next Gen: NOT DETECTED

## 2021-06-16 LAB — HEMOGLOBIN AND HEMATOCRIT, BLOOD
HCT: 26.5 % — ABNORMAL LOW (ref 36.0–46.0)
HCT: 22.2 % — ABNORMAL LOW (ref 36.0–46.0)
HCT: 23.2 % — ABNORMAL LOW (ref 36.0–46.0)
Hemoglobin: 8.5 g/dL — ABNORMAL LOW (ref 12.0–15.0)
Hemoglobin: 7.2 g/dL — ABNORMAL LOW (ref 12.0–15.0)
Hemoglobin: 7.6 g/dL — ABNORMAL LOW (ref 12.0–15.0)

## 2021-06-16 LAB — PREPARE RBC (CROSSMATCH)

## 2021-06-16 MED ORDER — DEXTROSE-NACL 5-0.9 % IV SOLN: INTRAVENOUS | Status: DC

## 2021-06-16 MED ORDER — SERTRALINE HCL 50 MG PO TABS
25.0000 mg | ORAL_TABLET | Freq: Every day | ORAL | Status: DC
  Administered 2021-06-16: 25 mg via ORAL
  Filled 2021-06-16: qty 1

## 2021-06-16 MED ORDER — SODIUM CHLORIDE 0.9% IV SOLUTION: Freq: Once | INTRAVENOUS | Status: DC

## 2021-06-16 MED ORDER — DIAZEPAM 2 MG PO TABS
1.0000 mg | ORAL_TABLET | Freq: Every day | ORAL | Status: DC
  Administered 2021-06-16: 1 mg via ORAL
  Filled 2021-06-16: qty 1

## 2021-06-16 MED ORDER — PROSOURCE PLUS PO LIQD
30.0000 mL | Freq: Two times a day (BID) | ORAL | Status: DC
  Administered 2021-06-16 – 2021-06-17 (×2): 30 mL via ORAL
  Filled 2021-06-16 (×2): qty 30

## 2021-06-16 MED ORDER — LACTATED RINGERS IV SOLN: INTRAVENOUS | Status: DC

## 2021-06-16 MED ORDER — ADULT MULTIVITAMIN W/MINERALS CH
1.0000 | ORAL_TABLET | Freq: Every day | ORAL | Status: DC
  Administered 2021-06-16 – 2021-06-17 (×2): 1 via ORAL
  Filled 2021-06-16 (×2): qty 1

## 2021-06-16 MED ORDER — LACTATED RINGERS IV BOLUS
1000.0000 mL | Freq: Once | INTRAVENOUS | Status: AC
  Administered 2021-06-16: 1000 mL via INTRAVENOUS

## 2021-06-16 MED ORDER — CHLORHEXIDINE GLUCONATE CLOTH 2 % EX PADS: 6.0000 | MEDICATED_PAD | Freq: Every day | CUTANEOUS | Status: DC

## 2021-06-16 MED ORDER — PEG 3350-KCL-NA BICARB-NACL 420 G PO SOLR
4000.0000 mL | Freq: Once | ORAL | Status: AC
  Administered 2021-06-16: 4000 mL via ORAL
  Filled 2021-06-16: qty 4000

## 2021-06-16 MED ORDER — ORAL CARE MOUTH RINSE
15.0000 mL | Freq: Two times a day (BID) | OROMUCOSAL | Status: DC
  Administered 2021-06-17: 15 mL via OROMUCOSAL

## 2021-06-16 MED ORDER — HYDRALAZINE HCL 20 MG/ML IJ SOLN
10.0000 mg | INTRAMUSCULAR | Status: DC | PRN
  Administered 2021-06-16 – 2021-06-17 (×2): 10 mg via INTRAVENOUS
  Filled 2021-06-16 (×3): qty 1

## 2021-06-16 MED ORDER — SODIUM CHLORIDE 0.9 % IV SOLN: INTRAVENOUS | Status: DC

## 2021-06-16 MED ORDER — CHLORHEXIDINE GLUCONATE 0.12 % MT SOLN
15.0000 mL | Freq: Two times a day (BID) | OROMUCOSAL | Status: DC
  Administered 2021-06-16: 15 mL via OROMUCOSAL
  Filled 2021-06-16 (×2): qty 15

## 2021-06-16 MED ORDER — BOOST / RESOURCE BREEZE PO LIQD CUSTOM
1.0000 | ORAL | Status: DC
  Administered 2021-06-16 – 2021-06-17 (×2): 1 via ORAL

## 2021-06-16 NOTE — Progress Notes (Signed)
PROGRESS NOTE    Stephanie KAMMEYER  [id number] DOB: 07-08-1941 DOA: 06/15/2021 PCP: [name], MD    Brief Narrative:  80 year old female with history of DVT on Xarelto, type 2 diabetes on metformin, hypertension, hyperlipidemia and history of diverticulosis with previous colonoscopies presented to the [facility] with multiple episodes of fresh rectal bleeding.  Patient woke up 8/3 morning to go to dentist office, she had at least 3 bloody bowel movement, became diaphoretic and dizzy and sent to ER.  In the emergency room blood pressure stable.  Hemoglobin 8.3 with baseline hemoglobin of 10-11.  INR 2.3.  Last dose of Xarelto on 8/2 evening.  CT scan with some diverticulosis no active bleeding.  Admitted to hospital due to significant symptoms.   Assessment & Plan:   Principal Problem:   Acute GI bleeding Active Problems:   Type 2 diabetes mellitus ([place])   Hypertension associated with diabetes ([place])   Hematochezia   Hyperlipidemia associated with type 2 diabetes mellitus ([facility])  Acute GI bleeding, symptomatic anemia.  Anemia of acute blood loss: Suspect lower GI bleeding given symptomatology.  Continue monitoring in the hospital.  Continue on maintenance IV fluids.  Currently hemodynamically stable. Hemoglobin dropped from 10-8.2-7.1-1 unit of PRBC transfusion overnight with appropriate response.  Continues to have rectal bleeding. Check hemoglobin every 6 hours, transfuse for less than 8. To be seen by gastroenterology, likely lower GI bleeding, however he still benefits with upper GI endoscopy as patient is on Xarelto and aspirin. Continue Protonix 40 mg IV twice daily.  Type 2 diabetes on metformin: Well-controlled.  Currently on sliding scale insulin.  Essential hypertension: At risk of hypotension.  Discontinue all antihypertensive medications.  History of DVT on Xarelto: Discontinued due to significant bleeding.   DVT prophylaxis: SCDs Start: 06/15/21  2317   Code Status: Full code Family Communication: None at the bedside Disposition Plan: Status is: Observation  The patient will require care spanning > 2 midnights and should be moved to inpatient because: Inpatient level of care appropriate due to severity of illness Patient will stay at his stepdown unit level of care because of ongoing bleeding, she will need more blood transfusions.  Dispo: The patient is from: Home              Anticipated d/c is to: Home              Patient currently is not medically stable to d/c.   Difficult to place patient No         Consultants:  Gastroenterology  Procedures:  None  Antimicrobials:  None   Subjective: Patient seen and examined.  Currently denies any symptoms.  All night she had multiple episodes of rectal bleeding.  Denies any nausea vomiting or abdominal pain or cramping. Remembers having colonoscopy many years ago with Dr. Cristina Gong who told her that she has diverticulosis. I witnessed about 200 mL of fresh at the bedside.  Objective: Vitals:   06/16/21 0412 06/16/21 0500 06/16/21 0600 06/16/21 0800  BP: (!) 180/69 (!) 148/91 (!) 180/81 (!) 171/81  Pulse: (!) 56 67 95 82  Resp: '11 13 16 '$ (!) 9  Temp: 98.7 F (37.1 C)   98.4 F (36.9 C)  TempSrc: Oral   Oral  SpO2: 100% 100% 100% 100%  Weight:      Height:        Intake/Output Summary (Last 24 hours) at 06/16/2021 0824 Last data filed at 06/16/2021 0657 Gross per 24 hour  Intake 315 ml  Output 300 ml  Net 15 ml   Filed Weights   06/15/21 1129 06/16/21 0300  Weight: 48.5 kg 46.3 kg    Examination:  General exam: Appears calm and comfortable . Respiratory system: Clear to auscultation. Respiratory effort normal. Cardiovascular system: S1 & S2 heard, RRR.  Gastrointestinal system: Abdomen is nondistended, soft and nontender. No organomegaly or masses felt. Normal bowel sounds heard. Central nervous system: Alert and oriented. No focal neurological  deficits. Extremities: Symmetric 5 x 5 power. Skin: No rashes, lesions or ulcers Psychiatry: Judgement and insight appear normal. Mood & affect appropriate.     Data Reviewed: I have personally reviewed following labs and imaging studies  CBC: Recent Labs  Lab 06/15/21 1237 06/15/21 1506 06/15/21 1858 06/15/21 2236 06/16/21 0611  WBC 4.7  --   --  8.3 7.8  NEUTROABS 2.9  --   --   --   --   HGB 8.3* 7.5* 7.8* 7.1* 9.7*  HCT 26.5* 23.5* 24.2* 22.7* 30.1*  MCV 84.7  --   --  85.7 89.6  PLT 231  --   --  203 [id number]   Basic Metabolic Panel: Recent Labs  Lab 06/15/21 1237 06/16/21 0611  NA 139 140  K 4.5 4.5  CL 106 109  CO2 24 19*  GLUCOSE 87 76  BUN 23 22  CREATININE 1.22* 1.20*  CALCIUM 9.1 9.3   GFR: Estimated Creatinine Clearance: 25.1 mL/min (A) (by C-G formula based on SCr of 1.2 mg/dL (H)). Liver Function Tests: Recent Labs  Lab 06/15/21 1237  AST 21  ALT 13  ALKPHOS 49  BILITOT 0.4  PROT 7.5  ALBUMIN 4.0   No results for input(s): LIPASE, AMYLASE in the last 168 hours. No results for input(s): AMMONIA in the last 168 hours. Coagulation Profile: Recent Labs  Lab 06/15/21 1237  INR 2.3*   Cardiac Enzymes: No results for input(s): CKTOTAL, CKMB, CKMBINDEX, TROPONINI in the last 168 hours. BNP (last 3 results) No results for input(s): PROBNP in the last 8760 hours. HbA1C: No results for input(s): HGBA1C in the last 72 hours. CBG: Recent Labs  Lab 06/15/21 1950 06/16/21 0019 06/16/21 0337 06/16/21 0725  GLUCAP 77 80 73 84   Lipid Profile: No results for input(s): CHOL, HDL, LDLCALC, TRIG, CHOLHDL, LDLDIRECT in the last 72 hours. Thyroid Function Tests: No results for input(s): TSH, T4TOTAL, FREET4, T3FREE, THYROIDAB in the last 72 hours. Anemia Panel: No results for input(s): VITAMINB12, FOLATE, FERRITIN, TIBC, IRON, RETICCTPCT in the last 72 hours. Sepsis Labs: No results for input(s): PROCALCITON, LATICACIDVEN in the last 168  hours.  Recent Results (from the past 240 hour(s))  Resp Panel by RT-PCR (Flu A&B, Covid) Nasopharyngeal Swab     Status: None   Collection Time: 06/15/21 12:37 PM   Specimen: Nasopharyngeal Swab; Nasopharyngeal(NP) swabs in vial transport medium  Result Value Ref Range Status   SARS Coronavirus 2 by RT PCR NEGATIVE NEGATIVE Final    Comment: (NOTE) SARS-CoV-2 target nucleic acids are NOT DETECTED.  The SARS-CoV-2 RNA is generally detectable in upper respiratory specimens during the acute phase of infection. The lowest concentration of SARS-CoV-2 viral copies this assay can detect is 138 copies/mL. A negative result does not preclude SARS-Cov-2 infection and should not be used as the sole basis for treatment or other patient management decisions. A negative result may occur with  improper specimen collection/handling, submission of specimen other than nasopharyngeal swab, presence of viral mutation(s) within the  areas targeted by this assay, and inadequate number of viral copies(<138 copies/mL). A negative result must be combined with clinical observations, patient history, and epidemiological information. The expected result is Negative.  Fact Sheet for Patients:  EntrepreneurPulse.com.au  Fact Sheet for Healthcare Providers:  IncredibleEmployment.be  This test is no t yet approved or cleared by the [place] FDA and  has been authorized for detection and/or diagnosis of SARS-CoV-2 by FDA under an Emergency Use Authorization (EUA). This EUA will remain  in effect (meaning this test can be used) for the duration of the COVID-19 declaration under Section 564(b)(1) of the Act, 21 U.S.C.section 360bbb-3(b)(1), unless the authorization is terminated  or revoked sooner.       Influenza A by PCR NEGATIVE NEGATIVE Final   Influenza B by PCR NEGATIVE NEGATIVE Final    Comment: (NOTE) The Xpert Xpress SARS-CoV-2/FLU/RSV plus assay is intended  as an aid in the diagnosis of influenza from Nasopharyngeal swab specimens and should not be used as a sole basis for treatment. Nasal washings and aspirates are unacceptable for Xpert Xpress SARS-CoV-2/FLU/RSV testing.  Fact Sheet for Patients: EntrepreneurPulse.com.au  Fact Sheet for Healthcare Providers: IncredibleEmployment.be  This test is not yet approved or cleared by the [place] FDA and has been authorized for detection and/or diagnosis of SARS-CoV-2 by FDA under an Emergency Use Authorization (EUA). This EUA will remain in effect (meaning this test can be used) for the duration of the COVID-19 declaration under Section 564(b)(1) of the Act, 21 U.S.C. section 360bbb-3(b)(1), unless the authorization is terminated or revoked.  Performed at [facility], [address] [place], [place] [address]          Radiology Studies: CT HEAD WO CONTRAST (5MM)  Result Date: 06/15/2021 CLINICAL DATA:  Neck pain, acute, no red flags fall EXAM: CT HEAD WITHOUT CONTRAST CT CERVICAL SPINE WITHOUT CONTRAST TECHNIQUE: Multidetector CT imaging of the head and cervical spine was performed following the standard protocol without intravenous contrast. Multiplanar CT image reconstructions of the cervical spine were also generated. COMPARISON:  MR head 03/10/2021, CT head 03/10/2021 FINDINGS: CT HEAD FINDINGS Brain: Patchy and confluent areas of decreased attenuation are noted throughout the deep and periventricular white matter of the cerebral hemispheres bilaterally, compatible with chronic microvascular ischemic disease. No evidence of large-territorial acute infarction. No parenchymal hemorrhage. No mass lesion. No extra-axial collection. No mass effect or midline shift. No hydrocephalus. Basilar cisterns are patent. Vascular: No hyperdense vessel. Skull: No acute fracture or focal lesion. Sinuses/Orbits: Paranasal sinuses and mastoid air cells are  clear. Bilateral lens replacement. Otherwise the orbits are unremarkable. Other: None. CT CERVICAL SPINE FINDINGS Alignment: Normal. Skull base and vertebrae: Multilevel degenerative changes of the spine. No acute fracture. No aggressive appearing focal osseous lesion or focal pathologic process. Soft tissues and spinal canal: No prevertebral fluid or swelling. No visible canal hematoma. Upper chest: Unremarkable. Other: Partially visualized hypodensity and calcification within the right thyroid gland. IMPRESSION: 1. No acute intracranial abnormality. 2. No acute displaced fracture or traumatic listhesis of the cervical spine. Electronically Signed   By: [name] M.D.   On: 06/15/2021 20:04   CT Cervical Spine Wo Contrast  Result Date: 06/15/2021 CLINICAL DATA:  Neck pain, acute, no red flags fall EXAM: CT HEAD WITHOUT CONTRAST CT CERVICAL SPINE WITHOUT CONTRAST TECHNIQUE: Multidetector CT imaging of the head and cervical spine was performed following the standard protocol without intravenous contrast. Multiplanar CT image reconstructions of the cervical spine were  also generated. COMPARISON:  MR head 03/10/2021, CT head 03/10/2021 FINDINGS: CT HEAD FINDINGS Brain: Patchy and confluent areas of decreased attenuation are noted throughout the deep and periventricular white matter of the cerebral hemispheres bilaterally, compatible with chronic microvascular ischemic disease. No evidence of large-territorial acute infarction. No parenchymal hemorrhage. No mass lesion. No extra-axial collection. No mass effect or midline shift. No hydrocephalus. Basilar cisterns are patent. Vascular: No hyperdense vessel. Skull: No acute fracture or focal lesion. Sinuses/Orbits: Paranasal sinuses and mastoid air cells are clear. Bilateral lens replacement. Otherwise the orbits are unremarkable. Other: None. CT CERVICAL SPINE FINDINGS Alignment: Normal. Skull base and vertebrae: Multilevel degenerative changes of the spine. No  acute fracture. No aggressive appearing focal osseous lesion or focal pathologic process. Soft tissues and spinal canal: No prevertebral fluid or swelling. No visible canal hematoma. Upper chest: Unremarkable. Other: Partially visualized hypodensity and calcification within the right thyroid gland. IMPRESSION: 1. No acute intracranial abnormality. 2. No acute displaced fracture or traumatic listhesis of the cervical spine. Electronically Signed   By: [name] M.D.   On: 06/15/2021 20:04   CT Angio Abd/Pel w/ and/or w/o  Result Date: 06/15/2021 CLINICAL DATA:  80 y/o female having frank bloody stools.  GI bleed EXAM: CTA ABDOMEN AND PELVIS WITHOUT AND WITH CONTRAST TECHNIQUE: Multidetector CT imaging of the abdomen and pelvis was performed using the standard protocol during bolus administration of intravenous contrast. Multiplanar reconstructed images and MIPs were obtained and reviewed to evaluate the vascular anatomy. CONTRAST:  58m OMNIPAQUE IOHEXOL 350 MG/ML SOLN COMPARISON:  None. FINDINGS: VASCULAR Aorta: Moderate atherosclerotic plaque. Normal caliber aorta without aneurysm, dissection, vasculitis or significant stenosis. Celiac: Patent without evidence of aneurysm, dissection, vasculitis or significant stenosis. SMA: Patent without evidence of aneurysm, dissection, vasculitis or significant stenosis. Renals: Mild to moderate atherosclerotic plaque. Both renal arteries are patent without evidence of aneurysm, dissection, vasculitis, fibromuscular dysplasia or significant stenosis. IMA: Mild atherosclerotic plaque. Patent without evidence of aneurysm, dissection, vasculitis or significant stenosis. Inflow: Patent without evidence of aneurysm, dissection, vasculitis or significant stenosis. Proximal Outflow: Bilateral common femoral and visualized portions of the superficial and profunda femoral arteries are patent without evidence of aneurysm, dissection, vasculitis or significant stenosis. Veins: No  obvious venous abnormality within the limitations of this arterial phase study. Review of the MIP images confirms the above findings. NON-VASCULAR Lower chest: Bilateral lower lobe subsegmental atelectasis. Hepatobiliary: Subcentimeter hypodensity too small to characterize. No focal liver abnormality. No gallstones, gallbladder wall thickening, or pericholecystic fluid. No biliary dilatation. Pancreas: No focal lesion. Normal pancreatic contour. No surrounding inflammatory changes. No main pancreatic ductal dilatation. Spleen: Normal in size without focal abnormality. Adrenals/Urinary Tract: No adrenal nodule bilaterally. Bilateral kidneys enhance symmetrically. Calcified stone 7 mm in the left kidney. Pericentimeter fluid density lesions within the kidneys likely represent simple renal cysts. No hydronephrosis. No hydroureter. The urinary bladder is unremarkable. Stomach/Bowel: Stomach is within normal limits. No evidence of bowel wall thickening or dilatation. No intravenous contrast extravasation into the lumen of the small or large bowel identified. Diffuse colonic diverticulosis. Appendix appears normal. Lymphatic: No lymphadenopathy. Reproductive: Status post hysterectomy. No adnexal masses. Other: No intraperitoneal free fluid. No intraperitoneal free gas. No organized fluid collection. Musculoskeletal: No abdominal wall hernia or abnormality. No suspicious lytic or blastic osseous lesions. No acute displaced fracture. Multilevel degenerative changes of the spine. IMPRESSION: VASCULAR 1. No CT findings of active gastrointestinal hemorrhage. This is not exclude a bleed as this may be intermittent or too slow  to be identified on CT. 2.  Aortic Atherosclerosis (ICD10-I70.0). NON-VASCULAR 1. Nonobstructive 7 mm left nephrolithiasis. 2. Scattered colonic diverticulosis with no acute diverticulitis. 3. Status post sigmoid resection. Electronically Signed   By: [name] M.D.   On: 06/15/2021 20:27         Scheduled Meds:  Chlorhexidine Gluconate Cloth  6 each Topical Daily   insulin aspart  0-9 Units Subcutaneous Q4H   pantoprazole (PROTONIX) IV  40 mg Intravenous Q12H   polyvinyl alcohol  1 drop Both Eyes Daily   sodium chloride flush  3 mL Intravenous Q12H   Continuous Infusions:  sodium chloride Stopped (06/16/21 0101)   lactated ringers       LOS: 0 days    Time spent: 35 minutes    Barb Merino, MD [facility] Pager [phone]

## 2021-06-16 NOTE — Progress Notes (Signed)
Initial Nutrition Assessment  DOCUMENTATION CODES:   Non-severe (moderate) malnutrition in context of chronic illness  INTERVENTION:  - will order Boost Breeze once/day each supplement provides 250 kcal and 9 grams of protein. - will order 30 ml Prosource Plus BID, each supplement provides 100 kcal and 15 grams protein.  - will order 1 tablet multivitamin with minerals/day. - diet advancement as medically feasible.    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  GOAL:   Patient will meet greater than or equal to 90% of their needs  MONITOR:   PO intake, Supplement acceptance, Diet advancement, Labs, Weight trends, I & O's  REASON FOR ASSESSMENT:   Malnutrition Screening Tool  ASSESSMENT:   80 year old female with medical history of DVT on Xarelto, type 2 DM on metformin, HTN, HLD, and diverticulosis with previous colonoscopies. She presented to the ED at [facility] d/t having multiple episodes of BRBPR. CT showed some diverticulosis no active bleeding.  Diet advanced from NPO to CLD today at 41 and she has consumed orange juice without issue.   Patient reports that for the past 9 months she has had intermittent episodes of blood in stool. She was also experiencing diarrhea that occurred nearly immediately after eating over the past 4-5 months when she would consume any dairy products or fruit, other than bananas. This led to some nervousness or fear around eating and so she eliminated these things from her diet and decreased volume of food consumed/day.  She was eating breakfast, skipping lunch, and eating dinner. Breakfast was mainly something like cereal, oatmeal, or a waffle, and scrambled eggs or bacon. Dinner was a meat (typically chicken), mashed potatoes or rice, and a vegetable.   She denies any abdominal pain or nausea with or without eating or  when having bowel movements. She denies chewing or swallowing pain or difficulty. No changes in taste.   She reports UBW of 135 lb and that over the past 8-9 months she has been steadily losing weight. Weight today is 102 lb and weight on 4/28 was 110 lb. This indicates 8 lb weight loss (7% body weight) in ~3 months.  RN reports plan is for colonoscopy tomorrow ~1300.    Labs reviewed; CBGs: 80, 73, 84, 65, 128 mg/dl, creatinine: 1.2 mg/dl, GFR: 45 ml/min. Medications reviewed; sliding scale novolog, 40 mg IV protonix BID.  IVF; LR @ 125 ml/hr.    NUTRITION - FOCUSED PHYSICAL EXAM:  Flowsheet Row Most Recent Value  Orbital Region Mild depletion  Upper Arm Region Moderate depletion  Thoracic and Lumbar Region Unable to assess  Buccal Region Mild depletion  Temple Region Mild depletion  Clavicle Bone Region Moderate depletion  Clavicle and Acromion Bone Region Moderate depletion  Scapular Bone Region Unable to assess  Dorsal Hand Mild depletion  Patellar Region Unable to assess  Anterior Thigh Region Unable to assess  Posterior Calf Region Unable to assess  Edema (RD Assessment) Unable to assess  Hair Reviewed  Eyes Reviewed  Mouth Reviewed  Skin Reviewed  Nails Reviewed       Diet Order:   Diet Order             Diet NPO time specified  Diet effective midnight           Diet clear liquid Room service appropriate? Yes; Fluid consistency: Thin  Diet effective now  EDUCATION NEEDS:   Not appropriate for education at this time  Skin:  Skin Assessment: Reviewed RN Assessment  Last BM:  8/4 (type 7 x3)  Height:   Ht Readings from Last 1 Encounters:  06/16/21 '4\' 11"'$  (1.499 m)    Weight:   Wt Readings from Last 1 Encounters:  06/16/21 46.3 kg      Estimated Nutritional Needs:  Kcal:  1300-1500 kcal Protein:  65-75 grams Fluid:  >/= 1.6 L/day      Jarome Matin, MS, RD, LDN, CNSC Inpatient Clinical Dietitian RD pager #  available in [facility]  After hours/weekend pager # available in [facility]

## 2021-06-16 NOTE — H&P (View-Only) (Signed)
Referring Provider: Dr. Sloan Barber Primary Care Physician:  Stephanie Dus, MD Primary Gastroenterologist:  Dr. Therisa Barber  Reason for Consultation:  GI bleed  HPI: HELAINE Barber is a 80 y.o. female with the acute onset of dark red and maroon blood and clots from rectum starting yesterday morning. Has had multiple episodes since onset yesterday morning. Denies associated abdominal pain, N/V. Felt lightheaded at the hospital. On Xarelto for history of DVT and last dose was 06/14/21 PM dose. INR 2.3. Hgb 8.3 on presentation and decreased to 7.1. Transfusd 2 U PRBCs and post-transfusion 9.7. Most recent Hgb 8.5. Colonoscopy in Feb 2018 showed diverticulosis and 2 adenomatous polyps removed. Husband at bedside.  Past Medical History:  Diagnosis Date   Arthritis    Chronic headache    Complication of anesthesia    allergy to Novocaine    Diabetes mellitus    GERD (gastroesophageal reflux disease)    Heart murmur    High cholesterol    Hyperlipidemia    Hypertension    Intracranial atherosclerosis    per MRA   Migraine    Narcolepsy and cataplexy    OSA on CPAP    states has not used CPAP in over a year   Rectal prolapse    Trigger finger of all digits of right hand     Past Surgical History:  Procedure Laterality Date   ABDOMINAL HYSTERECTOMY     BACK SURGERY     L4,L5 discectomy   CARDIAC CATHETERIZATION     CARPAL TUNNEL RELEASE Right 01/18/2017   KIDNEY SURGERY  1998   Right.  growth removed   rectal prolapse repair  12/05/11   ROTATOR CUFF REPAIR     Left   SHOULDER ARTHROSCOPY WITH SUBACROMIAL DECOMPRESSION, ROTATOR CUFF REPAIR AND BICEP TENDON REPAIR Left 05/25/2015   Procedure: SHOULDER ARTHROSCOPY WITH SUBACROMIAL DECOMPRESSION, ROTATOR CUFF REPAIR AND BICEP TENODESIS, DEBRIDEMENT. ;  Surgeon: Stephanie Pel, MD;  Location: [facility];  Service: Orthopedics;  Laterality: Left;  LEFT SHOULDER ROTATOR CUFF TEAR REPAIR, ARTHROSCOPY, DEBRIDEMENT, BICEPS TENODESIS, SUBACROMIAL  DECOMPRESSION.   TOTAL ABDOMINAL HYSTERECTOMY     TRIGGER FINGER RELEASE Right 04/30/2018   Procedure: RELEASE TRIGGER FINGER/A-1 PULLEY RIGHT MIDDLE  AND SMALLn ;  Surgeon: Stephanie Brod, MD;  Location: [facility];  Service: Orthopedics;  Laterality: Right;  FAB, Bier block   TUBAL LIGATION      Prior to Admission medications   Medication Sig Start Date End Date Taking? Authorizing Provider  acetaminophen (TYLENOL) 500 MG tablet Take 500 mg by mouth every 6 (six) hours as needed for moderate pain.   Yes [provider]  amLODipine (NORVASC) 10 MG tablet Take 10 mg by mouth daily after breakfast.   Yes [provider]  aspirin EC 81 MG tablet Take 81 mg by mouth at bedtime.   Yes [provider]  celecoxib (CELEBREX) 200 MG capsule Take 200 mg by mouth daily.   Yes [provider]  Cinnamon 500 MG capsule Take 500 mg by mouth daily.   Yes [provider]  diazepam (VALIUM) 2 MG tablet Take 1 mg by mouth at bedtime. 02/28/21  Yes [provider]  fish oil-omega-3 fatty acids 1000 MG capsule Take 1 g by mouth daily.   Yes [provider]  gabapentin (NEURONTIN) 400 MG capsule Take 400 mg by mouth at bedtime.   Yes [provider]  loperamide (IMODIUM A-D) 2 MG tablet Take 2 mg by mouth 4 (four)  times daily as needed for diarrhea or loose stools.   Yes [provider]  losartan (COZAAR) 100 MG tablet Take 100 mg by mouth daily after breakfast.   Yes [provider]  metFORMIN (GLUCOPHAGE) 500 MG tablet Take 500 mg by mouth 2 (two) times daily with a meal.   Yes [provider]  metoprolol (TOPROL-XL) 50 MG 24 hr tablet Take 50 mg by mouth daily after breakfast.   Yes [provider]  Polyethyl Glycol-Propyl Glycol 0.4-0.3 % SOLN Place 1 drop into both eyes daily.   Yes [provider]  pravastatin (PRAVACHOL) 40 MG tablet Take 40 mg by mouth at bedtime.   Yes [provider]  rivaroxaban (XARELTO) 20 MG TABS tablet Take 20 mg by mouth daily with supper.   Yes [provider]  sertraline (ZOLOFT) 25 MG tablet Take 25 mg by mouth at bedtime. 02/28/21  Yes [provider]  traMADol (ULTRAM) 50 MG tablet Take 50 mg by mouth daily as needed for moderate pain.   Yes [provider]  donepezil (ARICEPT) 10 MG tablet Take 1 tablet (10 mg total) by mouth at bedtime. Patient not taking: No sig reported 01/03/21   Stephanie Cloud, NP    Scheduled Meds:  Chlorhexidine Gluconate Cloth  6 each Topical Daily   insulin aspart  0-9 Units Subcutaneous Q4H   pantoprazole (PROTONIX) IV  40 mg Intravenous Q12H   polyethylene glycol-electrolytes  4,000 mL Oral Once   polyvinyl alcohol  1 drop Both Eyes Daily   sodium chloride flush  3 mL Intravenous Q12H   Continuous Infusions:  sodium chloride Stopped (06/16/21 0101)   sodium chloride     lactated ringers 125 mL/hr at 06/16/21 0903   PRN Meds:.ondansetron **OR** ondansetron (ZOFRAN) IV  Allergies as of 06/15/2021 - Review Complete 06/15/2021  Allergen Reaction Noted   Morphine and related Anaphylaxis 07/25/2011    Family History  Problem Relation Age of Onset   Heart disease Mother    Throat cancer Father    Cancer Father        stomach   Kidney cancer Brother    Heart disease Brother     Social History   Socioeconomic History   Marital status: Married    Spouse name: Stephanie Barber   Number of children: 4   Years of education: 14   Highest education level: Not on file  Occupational History   Occupation: retired. prev worked at [employer].  Tobacco Use   Smoking status: Former    Types: Cigarettes    Quit date: 11/13/1980    Years since quitting: 40.6   Smokeless tobacco: Never   Tobacco comments:    quit in 1982  Substance and Sexual Activity   Alcohol use: No    Alcohol/week: 0.0 standard drinks   Drug use: No   Sexual activity: Not Currently  Other Topics  Concern   Not on file  Social History Narrative   21 st January 2014 , patient underwent PS and MSLT - MSLT had one  SREMs and an average time to fall asleep of  *.8 minutes , her Ewort is 20 points,: facit: this patient has severe hypersomnia and is at risk when driving. medication in form ogf nuvigil smaples had been dispensed to her but she ha not yet taken it. 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 In detail -discussion of diagnosis and treatment  takes place today , first with nuvigil and if insufficient, with XYREM. Her CPAP treats her OSA very well, and she uses it 6 hours  or more each night,  residual AHI of 1.1  would not allow for OSA to be still explaining this degree of sleepiness. 2 downloads were reviewed. labs reviewed.    Patient is married Stephanie Barber) and lives at home with her husband and grandchild.   Patient has four children   Patient is retired.   Patient has a college education.   Patient is right-handed.   Patient does not drink any caffeine.      Social Determinants of Health   Financial Resource Strain: Not on file  Food Insecurity: Not on file  Transportation Needs: Not on file  Physical Activity: Not on file  Stress: Not on file  Social Connections: Not on file  Intimate Partner Violence: Not on file    Review of Systems: All negative except as stated above in HPI.  Physical Exam: Vital signs: Vitals:   06/16/21 0600 06/16/21 0800  BP: (!) 180/81 (!) 171/81  Pulse: 95 82  Resp: 16 (!) 9  Temp:  98.4 F (36.9 C)  SpO2: 100% 100%   Last BM Date: 06/16/21 General:   Lethargic, elderly, thin, no acute distress, pleasant  Head: normocephalic, atraumatic Eyes: anicteric sclera ENT: oropharynx clear Neck: supple, nontender Lungs:  Clear throughout to auscultation.   No wheezes, crackles, or rhonchi. No  acute distress. Heart:  Regular rate and rhythm; no murmurs, clicks, rubs,  or gallops. Abdomen: soft, nontender, nondistended, +BS  Rectal:  Deferred Ext: no edema  GI:  Lab Results: Recent Labs    06/15/21 1237 06/15/21 1506 06/15/21 2236 06/16/21 0611 06/16/21 1026  WBC 4.7  --  8.3 7.8  --   HGB 8.3*   < > 7.1* 9.7* 8.5*  HCT 26.5*   < > 22.7* 30.1* 26.5*  PLT 231  --  203 198  --    < > = values in this interval not displayed.   BMET Recent Labs    06/15/21 1237 06/16/21 0611  NA 139 140  K 4.5 4.5  CL 106 109  CO2 24 19*  GLUCOSE 87 76  BUN 23 22  CREATININE 1.22* 1.20*  CALCIUM 9.1 9.3   LFT Recent Labs    06/15/21 1237  PROT 7.5  ALBUMIN 4.0  AST 21  ALT 13  ALKPHOS 49  BILITOT 0.4  BILIDIR 0.1  IBILI 0.3   PT/INR Recent Labs    06/15/21 1237  LABPROT 25.0*  INR 2.3*     Studies/Results: CT HEAD WO CONTRAST (5MM)  Result Date: 06/15/2021 CLINICAL DATA:  Neck pain, acute, no red flags fall EXAM: CT HEAD WITHOUT CONTRAST CT CERVICAL SPINE WITHOUT CONTRAST TECHNIQUE: Multidetector CT imaging of the head and cervical spine was performed following the standard protocol without intravenous contrast. Multiplanar CT image reconstructions of the cervical spine were also generated. COMPARISON:  MR head 03/10/2021, CT head 03/10/2021 FINDINGS: CT HEAD FINDINGS Brain: Patchy and confluent areas of decreased attenuation are noted throughout the deep and periventricular white matter of the cerebral hemispheres bilaterally, compatible with chronic microvascular ischemic disease. No evidence of large-territorial acute infarction. No parenchymal hemorrhage. No mass lesion. No extra-axial collection. No mass effect or midline shift. No hydrocephalus. Basilar cisterns are patent. Vascular: No hyperdense vessel. Skull:  No acute fracture or focal lesion. Sinuses/Orbits: Paranasal sinuses and mastoid air cells are clear. Bilateral lens replacement. Otherwise the orbits  are unremarkable. Other: None. CT CERVICAL SPINE FINDINGS Alignment: Normal. Skull base and vertebrae: Multilevel degenerative changes of the spine. No acute fracture. No aggressive appearing focal osseous lesion or focal pathologic process. Soft tissues and spinal canal: No prevertebral fluid or swelling. No visible canal hematoma. Upper chest: Unremarkable. Other: Partially visualized hypodensity and calcification within the right thyroid gland. IMPRESSION: 1. No acute intracranial abnormality. 2. No acute displaced fracture or traumatic listhesis of the cervical spine. Electronically Signed   By: [name] M.D.   On: 06/15/2021 20:04   CT Cervical Spine Wo Contrast  Result Date: 06/15/2021 CLINICAL DATA:  Neck pain, acute, no red flags fall EXAM: CT HEAD WITHOUT CONTRAST CT CERVICAL SPINE WITHOUT CONTRAST TECHNIQUE: Multidetector CT imaging of the head and cervical spine was performed following the standard protocol without intravenous contrast. Multiplanar CT image reconstructions of the cervical spine were also generated. COMPARISON:  MR head 03/10/2021, CT head 03/10/2021 FINDINGS: CT HEAD FINDINGS Brain: Patchy and confluent areas of decreased attenuation are noted throughout the deep and periventricular white matter of the cerebral hemispheres bilaterally, compatible with chronic microvascular ischemic disease. No evidence of large-territorial acute infarction. No parenchymal hemorrhage. No mass lesion. No extra-axial collection. No mass effect or midline shift. No hydrocephalus. Basilar cisterns are patent. Vascular: No hyperdense vessel. Skull: No acute fracture or focal lesion. Sinuses/Orbits: Paranasal sinuses and mastoid air cells are clear. Bilateral lens replacement. Otherwise the orbits are unremarkable. Other: None. CT CERVICAL SPINE FINDINGS Alignment: Normal. Skull base and vertebrae: Multilevel degenerative changes of the spine. No acute fracture. No aggressive appearing focal osseous  lesion or focal pathologic process. Soft tissues and spinal canal: No prevertebral fluid or swelling. No visible canal hematoma. Upper chest: Unremarkable. Other: Partially visualized hypodensity and calcification within the right thyroid gland. IMPRESSION: 1. No acute intracranial abnormality. 2. No acute displaced fracture or traumatic listhesis of the cervical spine. Electronically Signed   By: [name] M.D.   On: 06/15/2021 20:04   CT Angio Abd/Barber w/ and/or w/o  Result Date: 06/15/2021 CLINICAL DATA:  80 y/o female having frank bloody stools.  GI bleed EXAM: CTA ABDOMEN AND PELVIS WITHOUT AND WITH CONTRAST TECHNIQUE: Multidetector CT imaging of the abdomen and pelvis was performed using the standard protocol during bolus administration of intravenous contrast. Multiplanar reconstructed images and MIPs were obtained and reviewed to evaluate the vascular anatomy. CONTRAST:  30m OMNIPAQUE IOHEXOL 350 MG/ML SOLN COMPARISON:  None. FINDINGS: VASCULAR Aorta: Moderate atherosclerotic plaque. Normal caliber aorta without aneurysm, dissection, vasculitis or significant stenosis. Celiac: Patent without evidence of aneurysm, dissection, vasculitis or significant stenosis. SMA: Patent without evidence of aneurysm, dissection, vasculitis or significant stenosis. Renals: Mild to moderate atherosclerotic plaque. Both renal arteries are patent without evidence of aneurysm, dissection, vasculitis, fibromuscular dysplasia or significant stenosis. IMA: Mild atherosclerotic plaque. Patent without evidence of aneurysm, dissection, vasculitis or significant stenosis. Inflow: Patent without evidence of aneurysm, dissection, vasculitis or significant stenosis. Proximal Outflow: Bilateral common femoral and visualized portions of the superficial and profunda femoral arteries are patent without evidence of aneurysm, dissection, vasculitis or significant stenosis. Veins: No obvious venous abnormality within the limitations of  this arterial phase study. Review of the MIP images confirms the above findings. NON-VASCULAR Lower chest: Bilateral lower lobe subsegmental atelectasis. Hepatobiliary: Subcentimeter hypodensity too small to characterize. No focal liver abnormality. No gallstones, gallbladder wall  thickening, or pericholecystic fluid. No biliary dilatation. Pancreas: No focal lesion. Normal pancreatic contour. No surrounding inflammatory changes. No main pancreatic ductal dilatation. Spleen: Normal in size without focal abnormality. Adrenals/Urinary Tract: No adrenal nodule bilaterally. Bilateral kidneys enhance symmetrically. Calcified stone 7 mm in the left kidney. Pericentimeter fluid density lesions within the kidneys likely represent simple renal cysts. No hydronephrosis. No hydroureter. The urinary bladder is unremarkable. Stomach/Bowel: Stomach is within normal limits. No evidence of bowel wall thickening or dilatation. No intravenous contrast extravasation into the lumen of the small or large bowel identified. Diffuse colonic diverticulosis. Appendix appears normal. Lymphatic: No lymphadenopathy. Reproductive: Status post hysterectomy. No adnexal masses. Other: No intraperitoneal free fluid. No intraperitoneal free gas. No organized fluid collection. Musculoskeletal: No abdominal wall hernia or abnormality. No suspicious lytic or blastic osseous lesions. No acute displaced fracture. Multilevel degenerative changes of the spine. IMPRESSION: VASCULAR 1. No CT findings of active gastrointestinal hemorrhage. This is not exclude a bleed as this may be intermittent or too slow to be identified on CT. 2.  Aortic Atherosclerosis (ICD10-I70.0). NON-VASCULAR 1. Nonobstructive 7 mm left nephrolithiasis. 2. Scattered colonic diverticulosis with no acute diverticulitis. 3. Status post sigmoid resection. Electronically Signed   By: [name] M.D.   On: 06/15/2021 20:27    Impression/Plan: Painless hematochezia likely due to  diverticular bleed and doubt peptic ulcer bleed. Elevated INR may be causing mucosal bleeding source. Hold Xarelto. Colon prep today and do EGD/colon tomorrow. Clear liquid diet today and NPO p MN. Follow H/Hs closely. Supportive care.    LOS: 0 days   [name]  06/16/2021, 12:03 PM  Questions please call [phone]

## 2021-06-16 NOTE — Consult Note (Signed)
Referring Provider: Dr. Sloan Leiter Primary Care Physician:  [name], MD Primary Gastroenterologist:  Dr. Therisa Doyne  Reason for Consultation:  GI bleed  HPI: Stephanie Barber is a 80 y.o. female with the acute onset of dark red and maroon blood and clots from rectum starting yesterday morning. Has had multiple episodes since onset yesterday morning. Denies associated abdominal pain, N/V. Felt lightheaded at the hospital. On Xarelto for history of DVT and last dose was 06/14/21 PM dose. INR 2.3. Hgb 8.3 on presentation and decreased to 7.1. Transfusd 2 U PRBCs and post-transfusion 9.7. Most recent Hgb 8.5. Colonoscopy in Feb 2018 showed diverticulosis and 2 adenomatous polyps removed. Husband at bedside.  Past Medical History:  Diagnosis Date   Arthritis    Chronic headache    Complication of anesthesia    allergy to Novocaine    Diabetes mellitus    GERD (gastroesophageal reflux disease)    Heart murmur    High cholesterol    Hyperlipidemia    Hypertension    Intracranial atherosclerosis    per MRA   Migraine    Narcolepsy and cataplexy    OSA on CPAP    states has not used CPAP in over a year   Rectal prolapse    Trigger finger of all digits of right hand     Past Surgical History:  Procedure Laterality Date   ABDOMINAL HYSTERECTOMY     BACK SURGERY     L4,L5 discectomy   CARDIAC CATHETERIZATION     CARPAL TUNNEL RELEASE Right 01/18/2017   KIDNEY SURGERY  1998   Right.  growth removed   rectal prolapse repair  12/05/11   ROTATOR CUFF REPAIR     Left   SHOULDER ARTHROSCOPY WITH SUBACROMIAL DECOMPRESSION, ROTATOR CUFF REPAIR AND BICEP TENDON REPAIR Left 05/25/2015   Procedure: SHOULDER ARTHROSCOPY WITH SUBACROMIAL DECOMPRESSION, ROTATOR CUFF REPAIR AND BICEP TENODESIS, DEBRIDEMENT. ;  Surgeon: Meredith Pel, MD;  Location: [facility];  Service: Orthopedics;  Laterality: Left;  LEFT SHOULDER ROTATOR CUFF TEAR REPAIR, ARTHROSCOPY, DEBRIDEMENT, BICEPS TENODESIS, SUBACROMIAL  DECOMPRESSION.   TOTAL ABDOMINAL HYSTERECTOMY     TRIGGER FINGER RELEASE Right 04/30/2018   Procedure: RELEASE TRIGGER FINGER/A-1 PULLEY RIGHT MIDDLE  AND SMALLn ;  Surgeon: Daryll Brod, MD;  Location: [facility];  Service: Orthopedics;  Laterality: Right;  FAB, Bier block   TUBAL LIGATION      Prior to Admission medications   Medication Sig Start Date End Date Taking? Authorizing Provider  acetaminophen (TYLENOL) 500 MG tablet Take 500 mg by mouth every 6 (six) hours as needed for moderate pain.   Yes [provider]  amLODipine (NORVASC) 10 MG tablet Take 10 mg by mouth daily after breakfast.   Yes [provider]  aspirin EC 81 MG tablet Take 81 mg by mouth at bedtime.   Yes [provider]  celecoxib (CELEBREX) 200 MG capsule Take 200 mg by mouth daily.   Yes [provider]  Cinnamon 500 MG capsule Take 500 mg by mouth daily.   Yes [provider]  diazepam (VALIUM) 2 MG tablet Take 1 mg by mouth at bedtime. 02/28/21  Yes [provider]  fish oil-omega-3 fatty acids 1000 MG capsule Take 1 g by mouth daily.   Yes [provider]  gabapentin (NEURONTIN) 400 MG capsule Take 400 mg by mouth at bedtime.   Yes [provider]  loperamide (IMODIUM A-D) 2 MG tablet Take 2 mg by mouth 4 (four)  times daily as needed for diarrhea or loose stools.   Yes [provider]  losartan (COZAAR) 100 MG tablet Take 100 mg by mouth daily after breakfast.   Yes [provider]  metFORMIN (GLUCOPHAGE) 500 MG tablet Take 500 mg by mouth 2 (two) times daily with a meal.   Yes [provider]  metoprolol (TOPROL-XL) 50 MG 24 hr tablet Take 50 mg by mouth daily after breakfast.   Yes [provider]  Polyethyl Glycol-Propyl Glycol 0.4-0.3 % SOLN Place 1 drop into both eyes daily.   Yes [provider]  pravastatin (PRAVACHOL) 40 MG tablet Take 40 mg by mouth at bedtime.   Yes [provider]  rivaroxaban (XARELTO) 20 MG TABS tablet Take 20 mg by mouth daily with supper.   Yes [provider]  sertraline (ZOLOFT) 25 MG tablet Take 25 mg by mouth at bedtime. 02/28/21  Yes [provider]  traMADol (ULTRAM) 50 MG tablet Take 50 mg by mouth daily as needed for moderate pain.   Yes [provider]  donepezil (ARICEPT) 10 MG tablet Take 1 tablet (10 mg total) by mouth at bedtime. Patient not taking: No sig reported 01/03/21   Stephanie Cloud, NP    Scheduled Meds:  Chlorhexidine Gluconate Cloth  6 each Topical Daily   insulin aspart  0-9 Units Subcutaneous Q4H   pantoprazole (PROTONIX) IV  40 mg Intravenous Q12H   polyethylene glycol-electrolytes  4,000 mL Oral Once   polyvinyl alcohol  1 drop Both Eyes Daily   sodium chloride flush  3 mL Intravenous Q12H   Continuous Infusions:  sodium chloride Stopped (06/16/21 0101)   sodium chloride     lactated ringers 125 mL/hr at 06/16/21 0903   PRN Meds:.ondansetron **OR** ondansetron (ZOFRAN) IV  Allergies as of 06/15/2021 - Review Complete 06/15/2021  Allergen Reaction Noted   Morphine and related Anaphylaxis 07/25/2011    Family History  Problem Relation Age of Onset   Heart disease Mother    Throat cancer Father    Cancer Father        stomach   Kidney cancer Brother    Heart disease Brother     Social History   Socioeconomic History   Marital status: Married    Spouse name: Stephanie Barber   Number of children: 4   Years of education: 14   Highest education level: Not on file  Occupational History   Occupation: retired. prev worked at [employer].  Tobacco Use   Smoking status: Former    Types: Cigarettes    Quit date: 11/13/1980    Years since quitting: 40.6   Smokeless tobacco: Never   Tobacco comments:    quit in 1982  Substance and Sexual Activity   Alcohol use: No    Alcohol/week: 0.0 standard drinks   Drug use: No   Sexual activity: Not Currently  Other Topics  Concern   Not on file  Social History Narrative   21 st January 2014 , patient underwent PS and MSLT - MSLT had one  SREMs and an average time to fall asleep of  *.8 minutes , her Ewort is 20 points,: facit: this patient has severe hypersomnia and is at risk when driving. medication in form ogf nuvigil smaples had been dispensed to her but she ha not yet taken it. 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 In detail -discussion of diagnosis and treatment  takes place today , first with nuvigil and if insufficient, with XYREM. Her CPAP treats her OSA very well, and she uses it 6 hours  or more each night,  residual AHI of 1.1  would not allow for OSA to be still explaining this degree of sleepiness. 2 downloads were reviewed. labs reviewed.    Patient is married Stephanie Barber) and lives at home with her husband and grandchild.   Patient has four children   Patient is retired.   Patient has a college education.   Patient is right-handed.   Patient does not drink any caffeine.      Social Determinants of Health   Financial Resource Strain: Not on file  Food Insecurity: Not on file  Transportation Needs: Not on file  Physical Activity: Not on file  Stress: Not on file  Social Connections: Not on file  Intimate Partner Violence: Not on file    Review of Systems: All negative except as stated above in HPI.  Physical Exam: Vital signs: Vitals:   06/16/21 0600 06/16/21 0800  BP: (!) 180/81 (!) 171/81  Pulse: 95 82  Resp: 16 (!) 9  Temp:  98.4 F (36.9 C)  SpO2: 100% 100%   Last BM Date: 06/16/21 General:   Lethargic, elderly, thin, no acute distress, pleasant  Head: normocephalic, atraumatic Eyes: anicteric sclera ENT: oropharynx clear Neck: supple, nontender Lungs:  Clear throughout to auscultation.   No wheezes, crackles, or rhonchi. No  acute distress. Heart:  Regular rate and rhythm; no murmurs, clicks, rubs,  or gallops. Abdomen: soft, nontender, nondistended, +BS  Rectal:  Deferred Ext: no edema  GI:  Lab Results: Recent Labs    06/15/21 1237 06/15/21 1506 06/15/21 2236 06/16/21 0611 06/16/21 1026  WBC 4.7  --  8.3 7.8  --   HGB 8.3*   < > 7.1* 9.7* 8.5*  HCT 26.5*   < > 22.7* 30.1* 26.5*  PLT 231  --  203 198  --    < > = values in this interval not displayed.   BMET Recent Labs    06/15/21 1237 06/16/21 0611  NA 139 140  K 4.5 4.5  CL 106 109  CO2 24 19*  GLUCOSE 87 76  BUN 23 22  CREATININE 1.22* 1.20*  CALCIUM 9.1 9.3   LFT Recent Labs    06/15/21 1237  PROT 7.5  ALBUMIN 4.0  AST 21  ALT 13  ALKPHOS 49  BILITOT 0.4  BILIDIR 0.1  IBILI 0.3   PT/INR Recent Labs    06/15/21 1237  LABPROT 25.0*  INR 2.3*     Studies/Results: CT HEAD WO CONTRAST (5MM)  Result Date: 06/15/2021 CLINICAL DATA:  Neck pain, acute, no red flags fall EXAM: CT HEAD WITHOUT CONTRAST CT CERVICAL SPINE WITHOUT CONTRAST TECHNIQUE: Multidetector CT imaging of the head and cervical spine was performed following the standard protocol without intravenous contrast. Multiplanar CT image reconstructions of the cervical spine were also generated. COMPARISON:  MR head 03/10/2021, CT head 03/10/2021 FINDINGS: CT HEAD FINDINGS Brain: Patchy and confluent areas of decreased attenuation are noted throughout the deep and periventricular white matter of the cerebral hemispheres bilaterally, compatible with chronic microvascular ischemic disease. No evidence of large-territorial acute infarction. No parenchymal hemorrhage. No mass lesion. No extra-axial collection. No mass effect or midline shift. No hydrocephalus. Basilar cisterns are patent. Vascular: No hyperdense vessel. Skull:  No acute fracture or focal lesion. Sinuses/Orbits: Paranasal sinuses and mastoid air cells are clear. Bilateral lens replacement. Otherwise the orbits  are unremarkable. Other: None. CT CERVICAL SPINE FINDINGS Alignment: Normal. Skull base and vertebrae: Multilevel degenerative changes of the spine. No acute fracture. No aggressive appearing focal osseous lesion or focal pathologic process. Soft tissues and spinal canal: No prevertebral fluid or swelling. No visible canal hematoma. Upper chest: Unremarkable. Other: Partially visualized hypodensity and calcification within the right thyroid gland. IMPRESSION: 1. No acute intracranial abnormality. 2. No acute displaced fracture or traumatic listhesis of the cervical spine. Electronically Signed   By: [name] M.D.   On: 06/15/2021 20:04   CT Cervical Spine Wo Contrast  Result Date: 06/15/2021 CLINICAL DATA:  Neck pain, acute, no red flags fall EXAM: CT HEAD WITHOUT CONTRAST CT CERVICAL SPINE WITHOUT CONTRAST TECHNIQUE: Multidetector CT imaging of the head and cervical spine was performed following the standard protocol without intravenous contrast. Multiplanar CT image reconstructions of the cervical spine were also generated. COMPARISON:  MR head 03/10/2021, CT head 03/10/2021 FINDINGS: CT HEAD FINDINGS Brain: Patchy and confluent areas of decreased attenuation are noted throughout the deep and periventricular white matter of the cerebral hemispheres bilaterally, compatible with chronic microvascular ischemic disease. No evidence of large-territorial acute infarction. No parenchymal hemorrhage. No mass lesion. No extra-axial collection. No mass effect or midline shift. No hydrocephalus. Basilar cisterns are patent. Vascular: No hyperdense vessel. Skull: No acute fracture or focal lesion. Sinuses/Orbits: Paranasal sinuses and mastoid air cells are clear. Bilateral lens replacement. Otherwise the orbits are unremarkable. Other: None. CT CERVICAL SPINE FINDINGS Alignment: Normal. Skull base and vertebrae: Multilevel degenerative changes of the spine. No acute fracture. No aggressive appearing focal osseous  lesion or focal pathologic process. Soft tissues and spinal canal: No prevertebral fluid or swelling. No visible canal hematoma. Upper chest: Unremarkable. Other: Partially visualized hypodensity and calcification within the right thyroid gland. IMPRESSION: 1. No acute intracranial abnormality. 2. No acute displaced fracture or traumatic listhesis of the cervical spine. Electronically Signed   By: [name] M.D.   On: 06/15/2021 20:04   CT Angio Abd/Pel w/ and/or w/o  Result Date: 06/15/2021 CLINICAL DATA:  80 y/o female having frank bloody stools.  GI bleed EXAM: CTA ABDOMEN AND PELVIS WITHOUT AND WITH CONTRAST TECHNIQUE: Multidetector CT imaging of the abdomen and pelvis was performed using the standard protocol during bolus administration of intravenous contrast. Multiplanar reconstructed images and MIPs were obtained and reviewed to evaluate the vascular anatomy. CONTRAST:  34m OMNIPAQUE IOHEXOL 350 MG/ML SOLN COMPARISON:  None. FINDINGS: VASCULAR Aorta: Moderate atherosclerotic plaque. Normal caliber aorta without aneurysm, dissection, vasculitis or significant stenosis. Celiac: Patent without evidence of aneurysm, dissection, vasculitis or significant stenosis. SMA: Patent without evidence of aneurysm, dissection, vasculitis or significant stenosis. Renals: Mild to moderate atherosclerotic plaque. Both renal arteries are patent without evidence of aneurysm, dissection, vasculitis, fibromuscular dysplasia or significant stenosis. IMA: Mild atherosclerotic plaque. Patent without evidence of aneurysm, dissection, vasculitis or significant stenosis. Inflow: Patent without evidence of aneurysm, dissection, vasculitis or significant stenosis. Proximal Outflow: Bilateral common femoral and visualized portions of the superficial and profunda femoral arteries are patent without evidence of aneurysm, dissection, vasculitis or significant stenosis. Veins: No obvious venous abnormality within the limitations of  this arterial phase study. Review of the MIP images confirms the above findings. NON-VASCULAR Lower chest: Bilateral lower lobe subsegmental atelectasis. Hepatobiliary: Subcentimeter hypodensity too small to characterize. No focal liver abnormality. No gallstones, gallbladder wall  thickening, or pericholecystic fluid. No biliary dilatation. Pancreas: No focal lesion. Normal pancreatic contour. No surrounding inflammatory changes. No main pancreatic ductal dilatation. Spleen: Normal in size without focal abnormality. Adrenals/Urinary Tract: No adrenal nodule bilaterally. Bilateral kidneys enhance symmetrically. Calcified stone 7 mm in the left kidney. Pericentimeter fluid density lesions within the kidneys likely represent simple renal cysts. No hydronephrosis. No hydroureter. The urinary bladder is unremarkable. Stomach/Bowel: Stomach is within normal limits. No evidence of bowel wall thickening or dilatation. No intravenous contrast extravasation into the lumen of the small or large bowel identified. Diffuse colonic diverticulosis. Appendix appears normal. Lymphatic: No lymphadenopathy. Reproductive: Status post hysterectomy. No adnexal masses. Other: No intraperitoneal free fluid. No intraperitoneal free gas. No organized fluid collection. Musculoskeletal: No abdominal wall hernia or abnormality. No suspicious lytic or blastic osseous lesions. No acute displaced fracture. Multilevel degenerative changes of the spine. IMPRESSION: VASCULAR 1. No CT findings of active gastrointestinal hemorrhage. This is not exclude a bleed as this may be intermittent or too slow to be identified on CT. 2.  Aortic Atherosclerosis (ICD10-I70.0). NON-VASCULAR 1. Nonobstructive 7 mm left nephrolithiasis. 2. Scattered colonic diverticulosis with no acute diverticulitis. 3. Status post sigmoid resection. Electronically Signed   By: [name] M.D.   On: 06/15/2021 20:27    Impression/Plan: Painless hematochezia likely due to  diverticular bleed and doubt peptic ulcer bleed. Elevated INR may be causing mucosal bleeding source. Hold Xarelto. Colon prep today and do EGD/colon tomorrow. Clear liquid diet today and NPO p MN. Follow H/Hs closely. Supportive care.    LOS: 0 days   [name]  06/16/2021, 12:03 PM  Questions please call [phone]

## 2021-06-16 NOTE — ED Notes (Signed)
Pt recently had 2 bloody stools with dark red clots noted.  Stools were mainly blood instead of feces.  Pt a&ox4, NADN, vitals stable.  Stephanie Barber aware.  Pt receiving 1 unit of blood currently with repeat h/h ordered post transfusion.

## 2021-06-17 ENCOUNTER — Encounter (HOSPITAL_COMMUNITY): Payer: Self-pay | Admitting: Internal Medicine

## 2021-06-17 ENCOUNTER — Observation Stay (HOSPITAL_COMMUNITY): Payer: Medicare Other | Admitting: Anesthesiology

## 2021-06-17 ENCOUNTER — Encounter (HOSPITAL_COMMUNITY)
Admission: EM | Disposition: A | Discharge status: Home / Self Care | Payer: Self-pay | Source: Home / Self Care | Attending: Emergency Medicine

## 2021-06-17 DIAGNOSIS — K2900 Acute gastritis without bleeding: Secondary | ICD-10-CM | POA: Diagnosis not present

## 2021-06-17 DIAGNOSIS — Z980 Intestinal bypass and anastomosis status: Secondary | ICD-10-CM | POA: Diagnosis not present

## 2021-06-17 DIAGNOSIS — Z79899 Other long term (current) drug therapy: Secondary | ICD-10-CM | POA: Diagnosis not present

## 2021-06-17 DIAGNOSIS — Z7984 Long term (current) use of oral hypoglycemic drugs: Secondary | ICD-10-CM | POA: Diagnosis not present

## 2021-06-17 DIAGNOSIS — K922 Gastrointestinal hemorrhage, unspecified: Secondary | ICD-10-CM | POA: Diagnosis not present

## 2021-06-17 DIAGNOSIS — Z20822 Contact with and (suspected) exposure to covid-19: Secondary | ICD-10-CM | POA: Diagnosis not present

## 2021-06-17 DIAGNOSIS — K640 First degree hemorrhoids: Secondary | ICD-10-CM | POA: Diagnosis not present

## 2021-06-17 DIAGNOSIS — K921 Melena: Secondary | ICD-10-CM | POA: Diagnosis not present

## 2021-06-17 DIAGNOSIS — Z7982 Long term (current) use of aspirin: Secondary | ICD-10-CM | POA: Diagnosis not present

## 2021-06-17 DIAGNOSIS — Z9989 Dependence on other enabling machines and devices: Secondary | ICD-10-CM | POA: Diagnosis not present

## 2021-06-17 DIAGNOSIS — K5750 Diverticulosis of both small and large intestine without perforation or abscess without bleeding: Secondary | ICD-10-CM | POA: Diagnosis not present

## 2021-06-17 DIAGNOSIS — I10 Essential (primary) hypertension: Secondary | ICD-10-CM | POA: Diagnosis not present

## 2021-06-17 DIAGNOSIS — K5730 Diverticulosis of large intestine without perforation or abscess without bleeding: Secondary | ICD-10-CM | POA: Diagnosis not present

## 2021-06-17 DIAGNOSIS — E119 Type 2 diabetes mellitus without complications: Secondary | ICD-10-CM | POA: Diagnosis not present

## 2021-06-17 DIAGNOSIS — G4733 Obstructive sleep apnea (adult) (pediatric): Secondary | ICD-10-CM | POA: Diagnosis not present

## 2021-06-17 DIAGNOSIS — Z7901 Long term (current) use of anticoagulants: Secondary | ICD-10-CM | POA: Diagnosis not present

## 2021-06-17 DIAGNOSIS — D125 Benign neoplasm of sigmoid colon: Secondary | ICD-10-CM | POA: Diagnosis not present

## 2021-06-17 DIAGNOSIS — Z87891 Personal history of nicotine dependence: Secondary | ICD-10-CM | POA: Diagnosis not present

## 2021-06-17 DIAGNOSIS — E1159 Type 2 diabetes mellitus with other circulatory complications: Secondary | ICD-10-CM | POA: Diagnosis not present

## 2021-06-17 DIAGNOSIS — K5731 Diverticulosis of large intestine without perforation or abscess with bleeding: Secondary | ICD-10-CM | POA: Diagnosis not present

## 2021-06-17 HISTORY — PX: ESOPHAGOGASTRODUODENOSCOPY (EGD) WITH PROPOFOL: SHX5813

## 2021-06-17 HISTORY — PX: POLYPECTOMY: SHX5525

## 2021-06-17 HISTORY — PX: COLONOSCOPY WITH PROPOFOL: SHX5780

## 2021-06-17 LAB — TYPE AND SCREEN
ABO/RH(D): O POS
Antibody Screen: NEGATIVE
Unit division: 0
Unit division: 0
Unit division: 0

## 2021-06-17 LAB — HEMOGLOBIN AND HEMATOCRIT, BLOOD
HCT: 35 % — ABNORMAL LOW (ref 36.0–46.0)
Hemoglobin: 11.8 g/dL — ABNORMAL LOW (ref 12.0–15.0)

## 2021-06-17 LAB — BPAM RBC
Blood Product Expiration Date: 202209092359
Blood Product Expiration Date: 202209102359
Blood Product Expiration Date: 202209102359
ISSUE DATE / TIME: 202208040020
ISSUE DATE / TIME: 202208042029
ISSUE DATE / TIME: 202208042234
Unit Type and Rh: 5100
Unit Type and Rh: 5100
Unit Type and Rh: 5100

## 2021-06-17 LAB — GLUCOSE, CAPILLARY
Glucose-Capillary: 99 mg/dL (ref 70–99)
Glucose-Capillary: 190 mg/dL — ABNORMAL HIGH (ref 70–99)
Glucose-Capillary: 95 mg/dL (ref 70–99)

## 2021-06-17 SURGERY — ESOPHAGOGASTRODUODENOSCOPY (EGD) WITH PROPOFOL
Anesthesia: Monitor Anesthesia Care

## 2021-06-17 SURGERY — COLONOSCOPY WITH PROPOFOL
Anesthesia: Monitor Anesthesia Care

## 2021-06-17 MED ORDER — PROPOFOL 1000 MG/100ML IV EMUL
INTRAVENOUS | Status: AC
  Filled 2021-06-17: qty 100

## 2021-06-17 MED ORDER — PROCHLORPERAZINE EDISYLATE 10 MG/2ML IJ SOLN
5.0000 mg | Freq: Once | INTRAMUSCULAR | Status: AC
  Administered 2021-06-17: 5 mg via INTRAVENOUS
  Filled 2021-06-17: qty 2

## 2021-06-17 MED ORDER — METOPROLOL SUCCINATE ER 50 MG PO TB24
50.0000 mg | ORAL_TABLET | Freq: Every day | ORAL | Status: DC
  Administered 2021-06-17: 50 mg via ORAL
  Filled 2021-06-17: qty 1

## 2021-06-17 MED ORDER — LACTATED RINGERS IV SOLN: INTRAVENOUS | Status: DC

## 2021-06-17 MED ORDER — AMLODIPINE BESYLATE 5 MG PO TABS
5.0000 mg | ORAL_TABLET | Freq: Every day | ORAL | Status: DC
  Administered 2021-06-17: 5 mg via ORAL
  Filled 2021-06-17: qty 1

## 2021-06-17 MED ORDER — PROPOFOL 10 MG/ML IV BOLUS
INTRAVENOUS | Status: DC | PRN
  Administered 2021-06-17 (×2): 30 mg via INTRAVENOUS

## 2021-06-17 MED ORDER — PROPOFOL 500 MG/50ML IV EMUL
INTRAVENOUS | Status: DC | PRN
  Administered 2021-06-17: 100 ug/kg/min via INTRAVENOUS

## 2021-06-17 SURGICAL SUPPLY — 25 items

## 2021-06-17 NOTE — Transfer of Care (Signed)
Immediate Anesthesia Transfer of Care Note  Patient: Stephanie Barber  Procedure(s) Performed: COLONOSCOPY WITH PROPOFOL ESOPHAGOGASTRODUODENOSCOPY (EGD) WITH PROPOFOL POLYPECTOMY  Patient Location: Endoscopy Unit  Anesthesia Type:MAC  Level of Consciousness: awake, alert , oriented and patient cooperative  Airway & Oxygen Therapy: Patient Spontanous Breathing and Patient connected to face mask  Post-op Assessment: Report given to RN and Post -op Vital signs reviewed and stable  Post vital signs: Reviewed and stable  Last Vitals:  Vitals Value Taken Time  BP 144/69 06/17/21 1055  Temp    Pulse 109 06/17/21 1057  Resp 14 06/17/21 1057  SpO2 100 % 06/17/21 1057  Vitals shown include unvalidated device data.  Last Pain:  Vitals:   06/17/21 1000  TempSrc:   PainSc: 0-No pain         Complications: No notable events documented.

## 2021-06-17 NOTE — Op Note (Addendum)
[facility] Patient Name: Stephanie Barber Procedure Date: 06/17/2021 MRN: [id number] Attending MD: [name] , MD Date of Birth: [birth year] CSN: [id number] Age: 80 Admit Type: Inpatient Procedure:                Colonoscopy Indications:              Last colonoscopy: February 2018, Hematochezia Providers:                [name], MD, Carlyn Reichert, RN, Cherylynn Ridges, Technician, Dellie Catholic Referring MD:             hospital team Medicines:                Propofol per Anesthesia, Monitored Anesthesia Care Complications:            No immediate complications. Estimated Blood Loss:     Estimated blood loss was minimal. Procedure:                Pre-Anesthesia Assessment:                           - Prior to the procedure, a History and Physical                            was performed, and patient medications and                            allergies were reviewed. The patient's tolerance of                            previous anesthesia was also reviewed. The risks                            and benefits of the procedure and the sedation                            options and risks were discussed with the patient.                            All questions were answered, and informed consent                            was obtained. Prior Anticoagulants: The patient has                            taken Xarelto (rivaroxaban), last dose was 2 days                            prior to procedure. ASA Grade Assessment: III - A                            patient with severe systemic disease. After  reviewing the risks and benefits, the patient was                            deemed in satisfactory condition to undergo the                            procedure.                           After obtaining informed consent, the colonoscope                            was passed under direct vision. Throughout the                             procedure, the patient's blood pressure, pulse, and                            oxygen saturations were monitored continuously. The                            [id number]-HQ190L ([id number] Olympus colonoscope was                            introduced through the anus and advanced to the the                            cecum, identified by appendiceal orifice and                            ileocecal valve. The colonoscopy was performed                            without difficulty. The patient tolerated the                            procedure well. The quality of the bowel                            preparation was fair. The terminal ileum, ileocecal                            valve, appendiceal orifice, and rectum were                            photographed. Scope In: 10:38:20 AM Scope Out: 10:49:54 AM Scope Withdrawal Time: 0 hours 9 minutes 11 seconds  Total Procedure Duration: 0 hours 11 minutes 34 seconds  Findings:      The perianal and digital rectal examinations were normal.      Multiple small and large-mouthed diverticula were found in the entire       colon.      There was evidence of a prior end-to-end colo-colonic anastomosis in the       recto-sigmoid colon. This was patent and was characterized by healthy  appearing mucosa. The anastomosis was traversed.      A 3 mm polyp was found in the sigmoid colon. The polyp was flat. The       polyp was removed with a cold biopsy forceps. Resection and retrieval       were complete. Estimated blood loss was minimal.      The terminal ileum contained a few small diverticula.      Internal hemorrhoids were found during retroflexion. The hemorrhoids       were small and Grade I (internal hemorrhoids that do not prolapse). Impression:               - Preparation of the colon was fair.                           - Diverticulosis in the entire examined colon.                           - Patent end-to-end colo-colonic  anastomosis,                            characterized by healthy appearing mucosa.                           - One 3 mm polyp in the sigmoid colon, removed with                            a cold biopsy forceps. Resected and retrieved.                           - Ileal diverticula.                           - Internal hemorrhoids. Moderate Sedation:      N/A - MAC procedure Recommendation:           - Soft diet.                           - Await pathology results.                           - Repeat colonoscopy for surveillance based on                            pathology results.                           - Resume Xarelto (rivaroxaban) at prior dose today. Procedure Code(s):        --- Professional ---                           [phone], Colonoscopy, flexible; with biopsy, single                            or multiple Diagnosis Code(s):        --- Professional ---  K92.1, Melena (includes Hematochezia)                           K63.5, Polyp of colon                           K64.0, First degree hemorrhoids                           Z98.0, Intestinal bypass and anastomosis status                           K57.50, Diverticulosis of both small and large                            intestine without perforation or abscess without                            bleeding CPT copyright 2019 [employer]. All rights reserved. The codes documented in this report are preliminary and upon coder review may  be revised to meet current compliance requirements. [name], MD 06/17/2021 11:08:50 AM This report has been signed electronically. Number of Addenda: 0

## 2021-06-17 NOTE — Anesthesia Postprocedure Evaluation (Signed)
Anesthesia Post Note  Patient: Stephanie Barber  Procedure(s) Performed: COLONOSCOPY WITH PROPOFOL ESOPHAGOGASTRODUODENOSCOPY (EGD) WITH PROPOFOL POLYPECTOMY     Patient location during evaluation: PACU Anesthesia Type: MAC Level of consciousness: awake and alert Pain management: pain level controlled Vital Signs Assessment: post-procedure vital signs reviewed and stable Respiratory status: spontaneous breathing, nonlabored ventilation and respiratory function stable Cardiovascular status: blood pressure returned to baseline and stable Postop Assessment: no apparent nausea or vomiting Anesthetic complications: no   No notable events documented.  Last Vitals:  Vitals:   06/17/21 1105 06/17/21 1115  BP: (!) 150/71 (!) 155/72  Pulse: (!) 104 84  Resp: 15 20  Temp:    SpO2: 98% 99%    Last Pain:  Vitals:   06/17/21 1115  TempSrc:   PainSc: 0-No pain                 Pervis Hocking

## 2021-06-17 NOTE — Discharge Summary (Signed)
Physician Discharge Summary  Stephanie Barber [id number] DOB: 08/18/1941 DOA: 06/15/2021  PCP: [name], MD  Admit date: 06/15/2021 Discharge date: 06/17/2021  Admitted From: Home Disposition: Home  Recommendations for Outpatient Follow-up:  Follow up with PCP in 1-2 weeks Please obtain BMP/CBC in one week Will stop Xarelto.  Home Health: Not applicable Equipment/Devices: Not needed  Discharge Condition: Stable CODE STATUS: Full code Diet recommendation: Regular, low-salt and low-carb diet  Discharge summary: 80 year old female with history of DVT on Xarelto, type 2 diabetes on metformin, hypertension, hyperlipidemia and history of diverticulosis with previous colonoscopies presented to the [facility] with multiple episodes of fresh rectal bleeding.  Patient woke up 8/3 morning to go to dentist office, she had at least 3 bloody bowel movement, became diaphoretic and dizzy and sent to ER.  In the emergency room blood pressure stable.  Hemoglobin 8.3 with baseline hemoglobin of 10-11.  INR 2.3.  Last dose of Xarelto on 8/2 evening.  CT scan with some diverticulosis no active bleeding.  Admitted to hospital due to significant symptoms.  Acute lower GI bleeding, symptomatic anemia, anemia of acute blood loss: Patient presented with severe symptomatic fresh rectal bleeding, multiple episodes with orthostatic hypotension.  Resuscitated. Received total 3 units of PRBC transfusion. Xarelto was stopped. Ultimately she was stabilized, stopped having bloody stool. Underwent upper GI endoscopy and colonoscopy today and was found to have no source of bleeding. Patient likely had diverticular bleeding exacerbated by use of anticoagulation and now stopped altogether.  Hemoglobin is stable.  Mobilized with no orthostatic drop in blood pressure.  Patient had history of lower extremity DVT, unknown whether provoked or unprovoked.  Last DVT was 1 year ago.  No history of pulmonary  embolism.  One isolated episode of DVT, already 1 year on anticoagulation.  Will stop as anticoagulation risks outweigh benefits. Resume all antihypertensives today.  Discharge Diagnosis:  Principal Problem:   Acute GI bleeding Active Problems:   Type 2 diabetes mellitus (HCC)   Hypertension associated with diabetes ([place])   Hematochezia   Hyperlipidemia associated with type 2 diabetes mellitus ([place])   Malnutrition of moderate degree    Discharge Instructions  Discharge Instructions     Call MD for:   Complete by: As directed    If you start noticing blood in stool , call [facility]   Call MD for:  extreme fatigue   Complete by: As directed    Call MD for:  persistant dizziness or light-headedness   Complete by: As directed    Diet - low sodium heart healthy   Complete by: As directed    Increase activity slowly   Complete by: As directed       Allergies as of 06/17/2021       Reactions   Morphine And Related Anaphylaxis        Medication List     STOP taking these medications    aspirin EC 81 MG tablet   donepezil 10 MG tablet Commonly known as: ARICEPT   rivaroxaban 20 MG Tabs tablet Commonly known as: XARELTO       TAKE these medications    acetaminophen 500 MG tablet Commonly known as: TYLENOL Take 500 mg by mouth every 6 (six) hours as needed for moderate pain.   amLODipine 10 MG tablet Commonly known as: NORVASC Take 10 mg by mouth daily after breakfast.   celecoxib 200 MG capsule Commonly known as: CELEBREX Take 200 mg by mouth daily.  Cinnamon 500 MG capsule Take 500 mg by mouth daily.   diazepam 2 MG tablet Commonly known as: VALIUM Take 1 mg by mouth at bedtime.   fish oil-omega-3 fatty acids 1000 MG capsule Take 1 g by mouth daily.   gabapentin 400 MG capsule Commonly known as: NEURONTIN Take 400 mg by mouth at bedtime.   loperamide 2 MG tablet Commonly known as: IMODIUM A-D Take 2 mg by mouth 4 (four) times daily as  needed for diarrhea or loose stools.   losartan 100 MG tablet Commonly known as: COZAAR Take 100 mg by mouth daily after breakfast.   metFORMIN 500 MG tablet Commonly known as: GLUCOPHAGE Take 500 mg by mouth 2 (two) times daily with a meal.   metoprolol succinate 50 MG 24 hr tablet Commonly known as: TOPROL-XL Take 50 mg by mouth daily after breakfast.   Polyethyl Glycol-Propyl Glycol 0.4-0.3 % Soln Place 1 drop into both eyes daily.   pravastatin 40 MG tablet Commonly known as: PRAVACHOL Take 40 mg by mouth at bedtime.   sertraline 25 MG tablet Commonly known as: ZOLOFT Take 25 mg by mouth at bedtime.   traMADol 50 MG tablet Commonly known as: ULTRAM Take 50 mg by mouth daily as needed for moderate pain.        Follow-up Information     [name], MD Follow up in 2 week(s).   Specialty: Family Medicine Contact information: [place] [address] [phone]                Allergies  Allergen Reactions   Morphine And Related Anaphylaxis    Consultations: Gastroenterology   Procedures/Studies: CT HEAD WO CONTRAST (5MM)  Result Date: 06/15/2021 CLINICAL DATA:  Neck pain, acute, no red flags fall EXAM: CT HEAD WITHOUT CONTRAST CT CERVICAL SPINE WITHOUT CONTRAST TECHNIQUE: Multidetector CT imaging of the head and cervical spine was performed following the standard protocol without intravenous contrast. Multiplanar CT image reconstructions of the cervical spine were also generated. COMPARISON:  MR head 03/10/2021, CT head 03/10/2021 FINDINGS: CT HEAD FINDINGS Brain: Patchy and confluent areas of decreased attenuation are noted throughout the deep and periventricular white matter of the cerebral hemispheres bilaterally, compatible with chronic microvascular ischemic disease. No evidence of large-territorial acute infarction. No parenchymal hemorrhage. No mass lesion. No extra-axial collection. No mass effect or midline shift. No  hydrocephalus. Basilar cisterns are patent. Vascular: No hyperdense vessel. Skull: No acute fracture or focal lesion. Sinuses/Orbits: Paranasal sinuses and mastoid air cells are clear. Bilateral lens replacement. Otherwise the orbits are unremarkable. Other: None. CT CERVICAL SPINE FINDINGS Alignment: Normal. Skull base and vertebrae: Multilevel degenerative changes of the spine. No acute fracture. No aggressive appearing focal osseous lesion or focal pathologic process. Soft tissues and spinal canal: No prevertebral fluid or swelling. No visible canal hematoma. Upper chest: Unremarkable. Other: Partially visualized hypodensity and calcification within the right thyroid gland. IMPRESSION: 1. No acute intracranial abnormality. 2. No acute displaced fracture or traumatic listhesis of the cervical spine. Electronically Signed   By: [name] M.D.   On: 06/15/2021 20:04   CT Cervical Spine Wo Contrast  Result Date: 06/15/2021 CLINICAL DATA:  Neck pain, acute, no red flags fall EXAM: CT HEAD WITHOUT CONTRAST CT CERVICAL SPINE WITHOUT CONTRAST TECHNIQUE: Multidetector CT imaging of the head and cervical spine was performed following the standard protocol without intravenous contrast. Multiplanar CT image reconstructions of the cervical spine were also generated. COMPARISON:  MR head  03/10/2021, CT head 03/10/2021 FINDINGS: CT HEAD FINDINGS Brain: Patchy and confluent areas of decreased attenuation are noted throughout the deep and periventricular white matter of the cerebral hemispheres bilaterally, compatible with chronic microvascular ischemic disease. No evidence of large-territorial acute infarction. No parenchymal hemorrhage. No mass lesion. No extra-axial collection. No mass effect or midline shift. No hydrocephalus. Basilar cisterns are patent. Vascular: No hyperdense vessel. Skull: No acute fracture or focal lesion. Sinuses/Orbits: Paranasal sinuses and mastoid air cells are clear. Bilateral lens  replacement. Otherwise the orbits are unremarkable. Other: None. CT CERVICAL SPINE FINDINGS Alignment: Normal. Skull base and vertebrae: Multilevel degenerative changes of the spine. No acute fracture. No aggressive appearing focal osseous lesion or focal pathologic process. Soft tissues and spinal canal: No prevertebral fluid or swelling. No visible canal hematoma. Upper chest: Unremarkable. Other: Partially visualized hypodensity and calcification within the right thyroid gland. IMPRESSION: 1. No acute intracranial abnormality. 2. No acute displaced fracture or traumatic listhesis of the cervical spine. Electronically Signed   By: [name] M.D.   On: 06/15/2021 20:04   CT Angio Abd/Pel w/ and/or w/o  Result Date: 06/15/2021 CLINICAL DATA:  80 y/o female having frank bloody stools.  GI bleed EXAM: CTA ABDOMEN AND PELVIS WITHOUT AND WITH CONTRAST TECHNIQUE: Multidetector CT imaging of the abdomen and pelvis was performed using the standard protocol during bolus administration of intravenous contrast. Multiplanar reconstructed images and MIPs were obtained and reviewed to evaluate the vascular anatomy. CONTRAST:  61m OMNIPAQUE IOHEXOL 350 MG/ML SOLN COMPARISON:  None. FINDINGS: VASCULAR Aorta: Moderate atherosclerotic plaque. Normal caliber aorta without aneurysm, dissection, vasculitis or significant stenosis. Celiac: Patent without evidence of aneurysm, dissection, vasculitis or significant stenosis. SMA: Patent without evidence of aneurysm, dissection, vasculitis or significant stenosis. Renals: Mild to moderate atherosclerotic plaque. Both renal arteries are patent without evidence of aneurysm, dissection, vasculitis, fibromuscular dysplasia or significant stenosis. IMA: Mild atherosclerotic plaque. Patent without evidence of aneurysm, dissection, vasculitis or significant stenosis. Inflow: Patent without evidence of aneurysm, dissection, vasculitis or significant stenosis. Proximal Outflow: Bilateral  common femoral and visualized portions of the superficial and profunda femoral arteries are patent without evidence of aneurysm, dissection, vasculitis or significant stenosis. Veins: No obvious venous abnormality within the limitations of this arterial phase study. Review of the MIP images confirms the above findings. NON-VASCULAR Lower chest: Bilateral lower lobe subsegmental atelectasis. Hepatobiliary: Subcentimeter hypodensity too small to characterize. No focal liver abnormality. No gallstones, gallbladder wall thickening, or pericholecystic fluid. No biliary dilatation. Pancreas: No focal lesion. Normal pancreatic contour. No surrounding inflammatory changes. No main pancreatic ductal dilatation. Spleen: Normal in size without focal abnormality. Adrenals/Urinary Tract: No adrenal nodule bilaterally. Bilateral kidneys enhance symmetrically. Calcified stone 7 mm in the left kidney. Pericentimeter fluid density lesions within the kidneys likely represent simple renal cysts. No hydronephrosis. No hydroureter. The urinary bladder is unremarkable. Stomach/Bowel: Stomach is within normal limits. No evidence of bowel wall thickening or dilatation. No intravenous contrast extravasation into the lumen of the small or large bowel identified. Diffuse colonic diverticulosis. Appendix appears normal. Lymphatic: No lymphadenopathy. Reproductive: Status post hysterectomy. No adnexal masses. Other: No intraperitoneal free fluid. No intraperitoneal free gas. No organized fluid collection. Musculoskeletal: No abdominal wall hernia or abnormality. No suspicious lytic or blastic osseous lesions. No acute displaced fracture. Multilevel degenerative changes of the spine. IMPRESSION: VASCULAR 1. No CT findings of active gastrointestinal hemorrhage. This is not exclude a bleed as this may be intermittent or too slow to be identified on CT. 2.  Aortic Atherosclerosis (ICD10-I70.0). NON-VASCULAR 1. Nonobstructive 7 mm left  nephrolithiasis. 2. Scattered colonic diverticulosis with no acute diverticulitis. 3. Status post sigmoid resection. Electronically Signed   By: [name] M.D.   On: 06/15/2021 20:27   (Echo, Carotid, EGD, Colonoscopy, ERCP)    Subjective: Patient seen and examined in the morning rounds.  She had multiple bowel movements overnight after bowel prep and they were clear. Went to examine her in the afternoon after procedure, she is up about and going to bathroom.  Husband at the bedside.  Eager to go home.  Denies any problems.  Tolerated diet.   Discharge Exam: Vitals:   06/17/21 1115 06/17/21 1137  BP: (!) 155/72   Pulse: 84   Resp: 20   Temp:  98.1 F (36.7 C)  SpO2: 99%    Vitals:   06/17/21 1055 06/17/21 1105 06/17/21 1115 06/17/21 1137  BP: (!) 144/69 (!) 150/71 (!) 155/72   Pulse: (!) 104 (!) 104 84   Resp: '17 15 20   '$ Temp:    98.1 F (36.7 C)  TempSrc:    Oral  SpO2: 100% 98% 99%   Weight:      Height:        General: Pt is alert, awake, not in acute distress Cardiovascular: RRR, S1/S2 +, no rubs, no gallops Respiratory: CTA bilaterally, no wheezing, no rhonchi Abdominal: Soft, NT, ND, bowel sounds + Extremities: no edema, no cyanosis    The results of significant diagnostics from this hospitalization (including imaging, microbiology, ancillary and laboratory) are listed below for reference.     Microbiology: Recent Results (from the past 240 hour(s))  Resp Panel by RT-PCR (Flu A&B, Covid) Nasopharyngeal Swab     Status: None   Collection Time: 06/15/21 12:37 PM   Specimen: Nasopharyngeal Swab; Nasopharyngeal(NP) swabs in vial transport medium  Result Value Ref Range Status   SARS Coronavirus 2 by RT PCR NEGATIVE NEGATIVE Final    Comment: (NOTE) SARS-CoV-2 target nucleic acids are NOT DETECTED.  The SARS-CoV-2 RNA is generally detectable in upper respiratory specimens during the acute phase of infection. The lowest concentration of SARS-CoV-2 viral  copies this assay can detect is 138 copies/mL. A negative result does not preclude SARS-Cov-2 infection and should not be used as the sole basis for treatment or other patient management decisions. A negative result may occur with  improper specimen collection/handling, submission of specimen other than nasopharyngeal swab, presence of viral mutation(s) within the areas targeted by this assay, and inadequate number of viral copies(<138 copies/mL). A negative result must be combined with clinical observations, patient history, and epidemiological information. The expected result is Negative.  Fact Sheet for Patients:  EntrepreneurPulse.com.au  Fact Sheet for Healthcare Providers:  IncredibleEmployment.be  This test is no t yet approved or cleared by the [place] FDA and  has been authorized for detection and/or diagnosis of SARS-CoV-2 by FDA under an Emergency Use Authorization (EUA). This EUA will remain  in effect (meaning this test can be used) for the duration of the COVID-19 declaration under Section 564(b)(1) of the Act, 21 U.S.C.section 360bbb-3(b)(1), unless the authorization is terminated  or revoked sooner.       Influenza A by PCR NEGATIVE NEGATIVE Final   Influenza B by PCR NEGATIVE NEGATIVE Final    Comment: (NOTE) The Xpert Xpress SARS-CoV-2/FLU/RSV plus assay is intended as an aid in the diagnosis of influenza from Nasopharyngeal swab specimens and should not be used as a sole basis for treatment.  Nasal washings and aspirates are unacceptable for Xpert Xpress SARS-CoV-2/FLU/RSV testing.  Fact Sheet for Patients: EntrepreneurPulse.com.au  Fact Sheet for Healthcare Providers: IncredibleEmployment.be  This test is not yet approved or cleared by the [place] FDA and has been authorized for detection and/or diagnosis of SARS-CoV-2 by FDA under an Emergency Use Authorization (EUA). This  EUA will remain in effect (meaning this test can be used) for the duration of the COVID-19 declaration under Section 564(b)(1) of the Act, 21 U.S.C. section 360bbb-3(b)(1), unless the authorization is terminated or revoked.  Performed at [facility], [address] [place], [place] [address]   MRSA Next Gen by PCR, Nasal     Status: None   Collection Time: 06/16/21  3:14 AM   Specimen: Nasal Mucosa; Nasal Swab  Result Value Ref Range Status   MRSA by PCR Next Gen NOT DETECTED NOT DETECTED Final    Comment: (NOTE) The GeneXpert MRSA Assay (FDA approved for NASAL specimens only), is one component of a comprehensive MRSA colonization surveillance program. It is not intended to diagnose MRSA infection nor to guide or monitor treatment for MRSA infections. Test performance is not FDA approved in patients less than 36 years old. Performed at [facility], [place] [address], [place], [place] [address]      Labs: BNP (last 3 results) No results for input(s): BNP in the last 8760 hours. Basic Metabolic Panel: Recent Labs  Lab 06/15/21 1237 06/16/21 0611  NA 139 140  K 4.5 4.5  CL 106 109  CO2 24 19*  GLUCOSE 87 76  BUN 23 22  CREATININE 1.22* 1.20*  CALCIUM 9.1 9.3   Liver Function Tests: Recent Labs  Lab 06/15/21 1237  AST 21  ALT 13  ALKPHOS 49  BILITOT 0.4  PROT 7.5  ALBUMIN 4.0   No results for input(s): LIPASE, AMYLASE in the last 168 hours. No results for input(s): AMMONIA in the last 168 hours. CBC: Recent Labs  Lab 06/15/21 1237 06/15/21 1506 06/15/21 2236 06/16/21 0611 06/16/21 1026 06/16/21 1620 06/16/21 1843 06/17/21 0401  WBC 4.7  --  8.3 7.8  --   --   --   --   NEUTROABS 2.9  --   --   --   --   --   --   --   HGB 8.3*   < > 7.1* 9.7* 8.5* 7.2* 7.6* 11.8*  HCT 26.5*   < > 22.7* 30.1* 26.5* 22.2* 23.2* 35.0*  MCV 84.7  --  85.7 89.6  --   --   --   --   PLT 231  --  203 198  --   --   --   --    < > = values in  this interval not displayed.   Cardiac Enzymes: No results for input(s): CKTOTAL, CKMB, CKMBINDEX, TROPONINI in the last 168 hours. BNP: Invalid input(s): POCBNP CBG: Recent Labs  Lab 06/16/21 1922 06/16/21 2329 06/17/21 0400 06/17/21 0817 06/17/21 1204  GLUCAP 101* 84 99 190* 95   D-Dimer No results for input(s): DDIMER in the last 72 hours. Hgb A1c No results for input(s): HGBA1C in the last 72 hours. Lipid Profile No results for input(s): CHOL, HDL, LDLCALC, TRIG, CHOLHDL, LDLDIRECT in the last 72 hours. Thyroid function studies No results for input(s): TSH, T4TOTAL, T3FREE, THYROIDAB in the last 72 hours.  Invalid input(s): FREET3 Anemia work up No results for input(s): VITAMINB12, FOLATE, FERRITIN, TIBC, IRON, RETICCTPCT in  the last 72 hours. Urinalysis    Component Value Date/Time   COLORURINE STRAW (A) 03/10/2021 1432   APPEARANCEUR CLEAR 03/10/2021 1432   LABSPEC 1.009 03/10/2021 1432   PHURINE 5.0 03/10/2021 1432   GLUCOSEU NEGATIVE 03/10/2021 1432   HGBUR NEGATIVE 03/10/2021 1432   BILIRUBINUR NEGATIVE 03/10/2021 1432   KETONESUR NEGATIVE 03/10/2021 1432   PROTEINUR NEGATIVE 03/10/2021 1432   UROBILINOGEN 0.2 05/20/2009 1405   NITRITE NEGATIVE 03/10/2021 1432   LEUKOCYTESUR MODERATE (A) 03/10/2021 1432   Sepsis Labs Invalid input(s): PROCALCITONIN,  WBC,  LACTICIDVEN Microbiology Recent Results (from the past 240 hour(s))  Resp Panel by RT-PCR (Flu A&B, Covid) Nasopharyngeal Swab     Status: None   Collection Time: 06/15/21 12:37 PM   Specimen: Nasopharyngeal Swab; Nasopharyngeal(NP) swabs in vial transport medium  Result Value Ref Range Status   SARS Coronavirus 2 by RT PCR NEGATIVE NEGATIVE Final    Comment: (NOTE) SARS-CoV-2 target nucleic acids are NOT DETECTED.  The SARS-CoV-2 RNA is generally detectable in upper respiratory specimens during the acute phase of infection. The lowest concentration of SARS-CoV-2 viral copies this assay can detect  is 138 copies/mL. A negative result does not preclude SARS-Cov-2 infection and should not be used as the sole basis for treatment or other patient management decisions. A negative result may occur with  improper specimen collection/handling, submission of specimen other than nasopharyngeal swab, presence of viral mutation(s) within the areas targeted by this assay, and inadequate number of viral copies(<138 copies/mL). A negative result must be combined with clinical observations, patient history, and epidemiological information. The expected result is Negative.  Fact Sheet for Patients:  EntrepreneurPulse.com.au  Fact Sheet for Healthcare Providers:  IncredibleEmployment.be  This test is no t yet approved or cleared by the [place] FDA and  has been authorized for detection and/or diagnosis of SARS-CoV-2 by FDA under an Emergency Use Authorization (EUA). This EUA will remain  in effect (meaning this test can be used) for the duration of the COVID-19 declaration under Section 564(b)(1) of the Act, 21 U.S.C.section 360bbb-3(b)(1), unless the authorization is terminated  or revoked sooner.       Influenza A by PCR NEGATIVE NEGATIVE Final   Influenza B by PCR NEGATIVE NEGATIVE Final    Comment: (NOTE) The Xpert Xpress SARS-CoV-2/FLU/RSV plus assay is intended as an aid in the diagnosis of influenza from Nasopharyngeal swab specimens and should not be used as a sole basis for treatment. Nasal washings and aspirates are unacceptable for Xpert Xpress SARS-CoV-2/FLU/RSV testing.  Fact Sheet for Patients: EntrepreneurPulse.com.au  Fact Sheet for Healthcare Providers: IncredibleEmployment.be  This test is not yet approved or cleared by the [place] FDA and has been authorized for detection and/or diagnosis of SARS-CoV-2 by FDA under an Emergency Use Authorization (EUA). This EUA will remain in effect  (meaning this test can be used) for the duration of the COVID-19 declaration under Section 564(b)(1) of the Act, 21 U.S.C. section 360bbb-3(b)(1), unless the authorization is terminated or revoked.  Performed at [facility], [address] [place], [place] [address]   MRSA Next Gen by PCR, Nasal     Status: None   Collection Time: 06/16/21  3:14 AM   Specimen: Nasal Mucosa; Nasal Swab  Result Value Ref Range Status   MRSA by PCR Next Gen NOT DETECTED NOT DETECTED Final    Comment: (NOTE) The GeneXpert MRSA Assay (FDA approved for NASAL specimens only), is one component of a comprehensive MRSA colonization  surveillance program. It is not intended to diagnose MRSA infection nor to guide or monitor treatment for MRSA infections. Test performance is not FDA approved in patients less than 35 years old. Performed at [facility], [place] [address], [place], [place] [address]      Time coordinating discharge:  30 minutes  SIGNED:   Barb Merino, MD  [facility] 06/17/2021, 2:03 PM

## 2021-06-17 NOTE — Op Note (Signed)
[facility] Patient Name: Stephanie Barber Procedure Date: 06/17/2021 MRN: [id number] Attending MD: [name] , MD Date of Birth: 26-Mar-1941 CSN: [id number] Age: 80 Admit Type: Inpatient Procedure:                Upper GI endoscopy Indications:              Hematochezia Providers:                [name], MD, Carlyn Reichert, RN, Cherylynn Ridges, Technician, Dellie Catholic Referring MD:             hospital team Medicines:                Propofol per Anesthesia, Monitored Anesthesia Care Complications:            No immediate complications. Estimated Blood Loss:     Estimated blood loss: none. Procedure:                Pre-Anesthesia Assessment:                           - Prior to the procedure, a History and Physical                            was performed, and patient medications and                            allergies were reviewed. The patient's tolerance of                            previous anesthesia was also reviewed. The risks                            and benefits of the procedure and the sedation                            options and risks were discussed with the patient.                            All questions were answered, and informed consent                            was obtained. Prior Anticoagulants: The patient has                            taken Xarelto (rivaroxaban), last dose was 2 days                            prior to procedure. ASA Grade Assessment: III - A                            patient with severe systemic disease. After  reviewing the risks and benefits, the patient was                            deemed in satisfactory condition to undergo the                            procedure.                           After obtaining informed consent, the endoscope was                            passed under direct vision. Throughout the                            procedure,  the patient's blood pressure, pulse, and                            oxygen saturations were monitored continuously. The                            GIF-H190 ([id number]) Olympus endoscope was introduced                            through the mouth, and advanced to the second part                            of duodenum. The upper GI endoscopy was                            accomplished without difficulty. The patient                            tolerated the procedure. Scope In: Scope Out: Findings:      The examined esophagus was normal.      The Z-line was regular and was found 42 cm from the incisors.      Segmental mild inflammation characterized by congestion (edema) and       erythema was found in the gastric antrum.      The cardia and gastric fundus were normal on retroflexion.      The examined duodenum was normal. Impression:               - Normal esophagus.                           - Z-line regular, 42 cm from the incisors.                           - Acute gastritis.                           - Normal examined duodenum.                           - No specimens collected. Moderate Sedation:      N/A -  MAC procedure Recommendation:           - See other procedure note.                           - Observe patient's clinical course. Procedure Code(s):        --- Professional ---                           [phone], Esophagogastroduodenoscopy, flexible,                            transoral; diagnostic, including collection of                            specimen(s) by brushing or washing, when performed                            (separate procedure) Diagnosis Code(s):        --- Professional ---                           K92.1, Melena (includes Hematochezia)                           K29.00, Acute gastritis without bleeding CPT copyright 2019 [employer]. All rights reserved. The codes documented in this report are preliminary and upon coder review may  be revised  to meet current compliance requirements. [name], MD 06/17/2021 11:02:52 AM This report has been signed electronically. Number of Addenda: 0

## 2021-06-17 NOTE — Interval H&P Note (Signed)
History and Physical Interval Note:  06/17/2021 10:17 AM  Stephanie Barber  has presented today for surgery, with the diagnosis of GI Bleed.  The various methods of treatment have been discussed with the patient and family. After consideration of risks, benefits and other options for treatment, the patient has consented to  Procedure(s): COLONOSCOPY WITH PROPOFOL (N/A) ESOPHAGOGASTRODUODENOSCOPY (EGD) WITH PROPOFOL (N/A) as a surgical intervention.  The patient's history has been reviewed, patient examined, no change in status, stable for surgery.  I have reviewed the patient's chart and labs.  Questions were answered to the patient's satisfaction.     [name]

## 2021-06-17 NOTE — TOC Initial Note (Signed)
Transition of Care [facility]) - Initial/Assessment Note    Patient Details  Name: Stephanie Barber MRN: [id number] Date of Birth: Dec 21, 1940  [facility] CM/SW Contact:    Leeroy Cha, RN Phone Number: 06/17/2021, 8:10 AM  Clinical Narrative:                 Hx of gi bleed, hgb on arrival 7.6 received one unit of bld and now hgb is 11.8 am of [id number] at 0800. TOC PLAN OF CARE:to return to home with self care.  Expected Discharge Plan: Home/Self Care Barriers to Discharge: No Barriers Identified   Patient Goals and CMS Choice Patient states their goals for this hospitalization and ongoing recovery are:: to go home i am feeling better CMS Medicare.gov Compare Post Acute Care list provided to:: Patient    Expected Discharge Plan and Services Expected Discharge Plan: Home/Self Care   Discharge Planning Services: CM Consult   Living arrangements for the past 2 months: Single Family Home                                      Prior Living Arrangements/Services Living arrangements for the past 2 months: Single Family Home Lives with:: Spouse Patient language and need for interpreter reviewed:: Yes Do you feel safe going back to the place where you live?: Yes            Criminal Activity/Legal Involvement Pertinent to Current Situation/Hospitalization: No - Comment as needed  Activities of Daily Living Home Assistive Devices/Equipment: Dentures (specify type) (upper and lower partials) ADL Screening (condition at time of admission) Patient's cognitive ability adequate to safely complete daily activities?: Yes Is the patient deaf or have difficulty hearing?: No Does the patient have difficulty seeing, even when wearing glasses/contacts?: No Does the patient have difficulty concentrating, remembering, or making decisions?: Yes (some memory issues) Patient able to express need for assistance with ADLs?: Yes Does the patient have difficulty dressing or  bathing?: No Independently performs ADLs?: Yes (appropriate for developmental age) (pt states she is independent but she fell today) Does the patient have difficulty walking or climbing stairs?: No Weakness of Legs: Both Weakness of Arms/Hands: Both  Permission Sought/Granted                  Emotional Assessment Appearance:: Appears stated age Attitude/Demeanor/Rapport: Engaged Affect (typically observed): Calm Orientation: : Oriented to Place, Oriented to Self, Oriented to  Time, Oriented to Situation Alcohol / Substance Use: Not Applicable Psych Involvement: No (comment)  Admission diagnosis:  Hematochezia [K92.1] Acute GI bleeding [K92.2] Gastrointestinal hemorrhage, unspecified gastrointestinal hemorrhage type [K92.2] Patient Active Problem List   Diagnosis Date Noted   Malnutrition of moderate degree 06/16/2021   Hematochezia 06/15/2021   Acute GI bleeding 06/15/2021   Hyperlipidemia associated with type 2 diabetes mellitus ([place]) 06/15/2021   Left-sided headache 06/30/2019   Memory loss 06/30/2019   AKI (acute kidney injury) ([place]) 05/06/2017   Dehydration 05/06/2017   Chest pain 05/06/2017   Wound infection after surgery 01/30/2017   Palpitations 08/24/2016   Noncompliance with CPAP treatment 12/28/2015   Poor compliance with CPAP treatment 10/26/2014   CPAP use counseling 07/28/2014   OSA on CPAP 07/28/2014   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

## 2021-06-17 NOTE — Progress Notes (Signed)
PROGRESS NOTE    Stephanie Barber  [id number] DOB: [birth year] DOA: 06/15/2021 PCP: [name], MD    Brief Narrative:  80 year old female with history of DVT on Xarelto, type 2 diabetes on metformin, hypertension, hyperlipidemia and history of diverticulosis with previous colonoscopies presented to the [facility] with multiple episodes of fresh rectal bleeding.  Patient woke up 8/3 morning to go to dentist office, she had at least 3 bloody bowel movement, became diaphoretic and dizzy and sent to ER.  In the emergency room blood pressure stable.  Hemoglobin 8.3 with baseline hemoglobin of 10-11.  INR 2.3.  Last dose of Xarelto on 8/2 evening.  CT scan with some diverticulosis no active bleeding.  Admitted to hospital due to significant symptoms.   Assessment & Plan:   Principal Problem:   Acute GI bleeding Active Problems:   Type 2 diabetes mellitus ([place])   Hypertension associated with diabetes ([place])   Hematochezia   Hyperlipidemia associated with type 2 diabetes mellitus ([facility])   Malnutrition of moderate degree  Acute GI bleeding, symptomatic anemia.  Anemia of acute blood loss: Suspect lower GI bleeding given symptomatology.  Continue on maintenance IV fluids.  Currently hemodynamically stable. Hemoglobin dropped from 10-8.2-7.1.  Received total 3 units of PRBC in last 2 days with appropriate response.  Continue monitoring hemoglobin. Rectal bleeding is clearing up now. For EGD and colonoscopy today.  Remains on Protonix 40 mg twice a day.  Xarelto on hold. Suspect diverticular bleed.  Type 2 diabetes on metformin: Well-controlled.  Currently on sliding scale insulin.  Essential hypertension: At risk of hypotension.  Holding antihypertensives.  History of DVT on Xarelto: Discontinued due to significant bleeding. Patient tells me that she had DVT 3 to 4 months ago, I could not find any records.  We will talk to family.  If it was provoked DVT more than 6 months  ago, we may be able to come off the Xarelto.  She is no more taking aspirin.   DVT prophylaxis: SCDs Start: 06/15/21 2317   Code Status: Full code Family Communication: None at the bedside Disposition Plan: Status is: Observation  The patient will require care spanning > 2 midnights and should be moved to inpatient because: Inpatient level of care appropriate due to severity of illness Patient will stay in the hospital.  She may need more blood transfusions and monitoring.  Dispo: The patient is from: Home              Anticipated d/c is to: Home              Patient currently is not medically stable to d/c.   Difficult to place patient No         Consultants:  Gastroenterology  Procedures:  None  Antimicrobials:  None   Subjective: Patient seen and examined.  Overnight after bowel prep, she had clear bowel movement with no blood trace on it early morning today.  Denies any nausea vomiting.  Attempted mobility she became dizzy last night.  Waiting to go to procedure.  Objective: Vitals:   06/17/21 0700 06/17/21 0742 06/17/21 0800 06/17/21 1000  BP: (!) 149/56  (!) 183/60 (!) 151/77  Pulse: 84  (!) 116 (!) 109  Resp: 15  (!) 21 15  Temp:  98.4 F (36.9 C)    TempSrc:  Oral Oral   SpO2: 98%  98% 96%  Weight:    46.3 kg  Height:    '4\' 11"'$  (1.499  m)    Intake/Output Summary (Last 24 hours) at 06/17/2021 1007 Last data filed at 06/17/2021 0649 Gross per 24 hour  Intake 2216.31 ml  Output --  Net 2216.31 ml   Filed Weights   06/15/21 1129 06/16/21 0300 06/17/21 1000  Weight: 48.5 kg 46.3 kg 46.3 kg    Examination:  General: Looks comfortable.  Not in any distress. Cardiovascular: S1-S2 normal.  Regular rate rhythm Respiratory: Bilateral clear.  No added sounds Gastrointestinal: Soft and nontender.  Bowel sounds present Ext: No swelling or edema.  No cyanosis Neuro: No joint deformities.  No erythema.     Data Reviewed: I have personally reviewed  following labs and imaging studies  CBC: Recent Labs  Lab 06/15/21 1237 06/15/21 1506 06/15/21 2236 06/16/21 0611 06/16/21 1026 06/16/21 1620 06/16/21 1843 06/17/21 0401  WBC 4.7  --  8.3 7.8  --   --   --   --   NEUTROABS 2.9  --   --   --   --   --   --   --   HGB 8.3*   < > 7.1* 9.7* 8.5* 7.2* 7.6* 11.8*  HCT 26.5*   < > 22.7* 30.1* 26.5* 22.2* 23.2* 35.0*  MCV 84.7  --  85.7 89.6  --   --   --   --   PLT 231  --  203 198  --   --   --   --    < > = values in this interval not displayed.   Basic Metabolic Panel: Recent Labs  Lab 06/15/21 1237 06/16/21 0611  NA 139 140  K 4.5 4.5  CL 106 109  CO2 24 19*  GLUCOSE 87 76  BUN 23 22  CREATININE 1.22* 1.20*  CALCIUM 9.1 9.3   GFR: Estimated Creatinine Clearance: 25.1 mL/min (A) (by C-G formula based on SCr of 1.2 mg/dL (H)). Liver Function Tests: Recent Labs  Lab 06/15/21 1237  AST 21  ALT 13  ALKPHOS 49  BILITOT 0.4  PROT 7.5  ALBUMIN 4.0   No results for input(s): LIPASE, AMYLASE in the last 168 hours. No results for input(s): AMMONIA in the last 168 hours. Coagulation Profile: Recent Labs  Lab 06/15/21 1237 06/16/21 1620  INR 2.3* 1.2   Cardiac Enzymes: No results for input(s): CKTOTAL, CKMB, CKMBINDEX, TROPONINI in the last 168 hours. BNP (last 3 results) No results for input(s): PROBNP in the last 8760 hours. HbA1C: No results for input(s): HGBA1C in the last 72 hours. CBG: Recent Labs  Lab 06/16/21 1534 06/16/21 1922 06/16/21 2329 06/17/21 0400 06/17/21 0817  GLUCAP 292* 101* 84 99 190*   Lipid Profile: No results for input(s): CHOL, HDL, LDLCALC, TRIG, CHOLHDL, LDLDIRECT in the last 72 hours. Thyroid Function Tests: No results for input(s): TSH, T4TOTAL, FREET4, T3FREE, THYROIDAB in the last 72 hours. Anemia Panel: No results for input(s): VITAMINB12, FOLATE, FERRITIN, TIBC, IRON, RETICCTPCT in the last 72 hours. Sepsis Labs: No results for input(s): PROCALCITON, LATICACIDVEN in the  last 168 hours.  Recent Results (from the past 240 hour(s))  Resp Panel by RT-PCR (Flu A&B, Covid) Nasopharyngeal Swab     Status: None   Collection Time: 06/15/21 12:37 PM   Specimen: Nasopharyngeal Swab; Nasopharyngeal(NP) swabs in vial transport medium  Result Value Ref Range Status   SARS Coronavirus 2 by RT PCR NEGATIVE NEGATIVE Final    Comment: (NOTE) SARS-CoV-2 target nucleic acids are NOT DETECTED.  The SARS-CoV-2 RNA is generally detectable in  upper respiratory specimens during the acute phase of infection. The lowest concentration of SARS-CoV-2 viral copies this assay can detect is 138 copies/mL. A negative result does not preclude SARS-Cov-2 infection and should not be used as the sole basis for treatment or other patient management decisions. A negative result may occur with  improper specimen collection/handling, submission of specimen other than nasopharyngeal swab, presence of viral mutation(s) within the areas targeted by this assay, and inadequate number of viral copies(<138 copies/mL). A negative result must be combined with clinical observations, patient history, and epidemiological information. The expected result is Negative.  Fact Sheet for Patients:  EntrepreneurPulse.com.au  Fact Sheet for Healthcare Providers:  IncredibleEmployment.be  This test is no t yet approved or cleared by the [place] FDA and  has been authorized for detection and/or diagnosis of SARS-CoV-2 by FDA under an Emergency Use Authorization (EUA). This EUA will remain  in effect (meaning this test can be used) for the duration of the COVID-19 declaration under Section 564(b)(1) of the Act, 21 U.S.C.section 360bbb-3(b)(1), unless the authorization is terminated  or revoked sooner.       Influenza A by PCR NEGATIVE NEGATIVE Final   Influenza B by PCR NEGATIVE NEGATIVE Final    Comment: (NOTE) The Xpert Xpress SARS-CoV-2/FLU/RSV plus assay is  intended as an aid in the diagnosis of influenza from Nasopharyngeal swab specimens and should not be used as a sole basis for treatment. Nasal washings and aspirates are unacceptable for Xpert Xpress SARS-CoV-2/FLU/RSV testing.  Fact Sheet for Patients: EntrepreneurPulse.com.au  Fact Sheet for Healthcare Providers: IncredibleEmployment.be  This test is not yet approved or cleared by the [place] FDA and has been authorized for detection and/or diagnosis of SARS-CoV-2 by FDA under an Emergency Use Authorization (EUA). This EUA will remain in effect (meaning this test can be used) for the duration of the COVID-19 declaration under Section 564(b)(1) of the Act, 21 U.S.C. section 360bbb-3(b)(1), unless the authorization is terminated or revoked.  Performed at [facility], [address] [place], [place] [address]   MRSA Next Gen by PCR, Nasal     Status: None   Collection Time: 06/16/21  3:14 AM   Specimen: Nasal Mucosa; Nasal Swab  Result Value Ref Range Status   MRSA by PCR Next Gen NOT DETECTED NOT DETECTED Final    Comment: (NOTE) The GeneXpert MRSA Assay (FDA approved for NASAL specimens only), is one component of a comprehensive MRSA colonization surveillance program. It is not intended to diagnose MRSA infection nor to guide or monitor treatment for MRSA infections. Test performance is not FDA approved in patients less than 24 years old. Performed at [facility], [place] [address], [place], [place] [address]          Radiology Studies: CT HEAD WO CONTRAST (5MM)  Result Date: 06/15/2021 CLINICAL DATA:  Neck pain, acute, no red flags fall EXAM: CT HEAD WITHOUT CONTRAST CT CERVICAL SPINE WITHOUT CONTRAST TECHNIQUE: Multidetector CT imaging of the head and cervical spine was performed following the standard protocol without intravenous contrast. Multiplanar CT image reconstructions of the cervical spine  were also generated. COMPARISON:  MR head 03/10/2021, CT head 03/10/2021 FINDINGS: CT HEAD FINDINGS Brain: Patchy and confluent areas of decreased attenuation are noted throughout the deep and periventricular white matter of the cerebral hemispheres bilaterally, compatible with chronic microvascular ischemic disease. No evidence of large-territorial acute infarction. No parenchymal hemorrhage. No mass lesion. No extra-axial collection. No mass effect or midline  shift. No hydrocephalus. Basilar cisterns are patent. Vascular: No hyperdense vessel. Skull: No acute fracture or focal lesion. Sinuses/Orbits: Paranasal sinuses and mastoid air cells are clear. Bilateral lens replacement. Otherwise the orbits are unremarkable. Other: None. CT CERVICAL SPINE FINDINGS Alignment: Normal. Skull base and vertebrae: Multilevel degenerative changes of the spine. No acute fracture. No aggressive appearing focal osseous lesion or focal pathologic process. Soft tissues and spinal canal: No prevertebral fluid or swelling. No visible canal hematoma. Upper chest: Unremarkable. Other: Partially visualized hypodensity and calcification within the right thyroid gland. IMPRESSION: 1. No acute intracranial abnormality. 2. No acute displaced fracture or traumatic listhesis of the cervical spine. Electronically Signed   By: [name] M.D.   On: 06/15/2021 20:04   CT Cervical Spine Wo Contrast  Result Date: 06/15/2021 CLINICAL DATA:  Neck pain, acute, no red flags fall EXAM: CT HEAD WITHOUT CONTRAST CT CERVICAL SPINE WITHOUT CONTRAST TECHNIQUE: Multidetector CT imaging of the head and cervical spine was performed following the standard protocol without intravenous contrast. Multiplanar CT image reconstructions of the cervical spine were also generated. COMPARISON:  MR head 03/10/2021, CT head 03/10/2021 FINDINGS: CT HEAD FINDINGS Brain: Patchy and confluent areas of decreased attenuation are noted throughout the deep and periventricular  white matter of the cerebral hemispheres bilaterally, compatible with chronic microvascular ischemic disease. No evidence of large-territorial acute infarction. No parenchymal hemorrhage. No mass lesion. No extra-axial collection. No mass effect or midline shift. No hydrocephalus. Basilar cisterns are patent. Vascular: No hyperdense vessel. Skull: No acute fracture or focal lesion. Sinuses/Orbits: Paranasal sinuses and mastoid air cells are clear. Bilateral lens replacement. Otherwise the orbits are unremarkable. Other: None. CT CERVICAL SPINE FINDINGS Alignment: Normal. Skull base and vertebrae: Multilevel degenerative changes of the spine. No acute fracture. No aggressive appearing focal osseous lesion or focal pathologic process. Soft tissues and spinal canal: No prevertebral fluid or swelling. No visible canal hematoma. Upper chest: Unremarkable. Other: Partially visualized hypodensity and calcification within the right thyroid gland. IMPRESSION: 1. No acute intracranial abnormality. 2. No acute displaced fracture or traumatic listhesis of the cervical spine. Electronically Signed   By: [name] M.D.   On: 06/15/2021 20:04   CT Angio Abd/Pel w/ and/or w/o  Result Date: 06/15/2021 CLINICAL DATA:  80 y/o female having frank bloody stools.  GI bleed EXAM: CTA ABDOMEN AND PELVIS WITHOUT AND WITH CONTRAST TECHNIQUE: Multidetector CT imaging of the abdomen and pelvis was performed using the standard protocol during bolus administration of intravenous contrast. Multiplanar reconstructed images and MIPs were obtained and reviewed to evaluate the vascular anatomy. CONTRAST:  32m OMNIPAQUE IOHEXOL 350 MG/ML SOLN COMPARISON:  None. FINDINGS: VASCULAR Aorta: Moderate atherosclerotic plaque. Normal caliber aorta without aneurysm, dissection, vasculitis or significant stenosis. Celiac: Patent without evidence of aneurysm, dissection, vasculitis or significant stenosis. SMA: Patent without evidence of aneurysm,  dissection, vasculitis or significant stenosis. Renals: Mild to moderate atherosclerotic plaque. Both renal arteries are patent without evidence of aneurysm, dissection, vasculitis, fibromuscular dysplasia or significant stenosis. IMA: Mild atherosclerotic plaque. Patent without evidence of aneurysm, dissection, vasculitis or significant stenosis. Inflow: Patent without evidence of aneurysm, dissection, vasculitis or significant stenosis. Proximal Outflow: Bilateral common femoral and visualized portions of the superficial and profunda femoral arteries are patent without evidence of aneurysm, dissection, vasculitis or significant stenosis. Veins: No obvious venous abnormality within the limitations of this arterial phase study. Review of the MIP images confirms the above findings. NON-VASCULAR Lower chest: Bilateral lower lobe subsegmental atelectasis. Hepatobiliary: Subcentimeter hypodensity too  small to characterize. No focal liver abnormality. No gallstones, gallbladder wall thickening, or pericholecystic fluid. No biliary dilatation. Pancreas: No focal lesion. Normal pancreatic contour. No surrounding inflammatory changes. No main pancreatic ductal dilatation. Spleen: Normal in size without focal abnormality. Adrenals/Urinary Tract: No adrenal nodule bilaterally. Bilateral kidneys enhance symmetrically. Calcified stone 7 mm in the left kidney. Pericentimeter fluid density lesions within the kidneys likely represent simple renal cysts. No hydronephrosis. No hydroureter. The urinary bladder is unremarkable. Stomach/Bowel: Stomach is within normal limits. No evidence of bowel wall thickening or dilatation. No intravenous contrast extravasation into the lumen of the small or large bowel identified. Diffuse colonic diverticulosis. Appendix appears normal. Lymphatic: No lymphadenopathy. Reproductive: Status post hysterectomy. No adnexal masses. Other: No intraperitoneal free fluid. No intraperitoneal free gas. No  organized fluid collection. Musculoskeletal: No abdominal wall hernia or abnormality. No suspicious lytic or blastic osseous lesions. No acute displaced fracture. Multilevel degenerative changes of the spine. IMPRESSION: VASCULAR 1. No CT findings of active gastrointestinal hemorrhage. This is not exclude a bleed as this may be intermittent or too slow to be identified on CT. 2.  Aortic Atherosclerosis (ICD10-I70.0). NON-VASCULAR 1. Nonobstructive 7 mm left nephrolithiasis. 2. Scattered colonic diverticulosis with no acute diverticulitis. 3. Status post sigmoid resection. Electronically Signed   By: [name] M.D.   On: 06/15/2021 20:27        Scheduled Meds:  [MAR Hold] (feeding supplement) PROSource Plus  30 mL Oral BID BM   [MAR Hold] sodium chloride   Intravenous Once   [MAR Hold] chlorhexidine  15 mL Mouth Rinse BID   [MAR Hold] Chlorhexidine Gluconate Cloth  6 each Topical Daily   [MAR Hold] diazepam  1 mg Oral QHS   [MAR Hold] feeding supplement  1 Container Oral Q24H   [MAR Hold] insulin aspart  0-9 Units Subcutaneous Q4H   [MAR Hold] mouth rinse  15 mL Mouth Rinse q12n4p   [MAR Hold] multivitamin with minerals  1 tablet Oral Daily   [MAR Hold] pantoprazole (PROTONIX) IV  40 mg Intravenous Q12H   [MAR Hold] polyvinyl alcohol  1 drop Both Eyes Daily   [MAR Hold] sertraline  25 mg Oral QHS   [MAR Hold] sodium chloride flush  3 mL Intravenous Q12H   Continuous Infusions:  [MAR Hold] sodium chloride Stopped (06/16/21 0101)   sodium chloride Stopped (06/16/21 1411)   dextrose 5 % and 0.9% NaCl 50 mL/hr at 06/17/21 0649   lactated ringers Stopped (06/16/21 2027)   lactated ringers       LOS: 0 days    Time spent: 34 minutes    Barb Merino, MD [facility] Pager [phone]

## 2021-06-17 NOTE — Brief Op Note (Signed)
Suspect diverticular bleed that has resolved. See endopro notes for details. Soft diet. Resume Xarelto if deemed necessary by primary team. [facility] will sign off. Call if questions.

## 2021-06-17 NOTE — Anesthesia Preprocedure Evaluation (Addendum)
Anesthesia Evaluation  Patient identified by MRN, date of birth, ID band Patient awake    Reviewed: Allergy & Precautions, NPO status , Patient's Chart, lab work & pertinent test results, reviewed documented beta blocker date and time   Airway Mallampati: III  TM Distance: >3 FB Neck ROM: Full    Dental  (+) Missing, Dental Advisory Given, Poor Dentition,  Denies any loose teeth:   Pulmonary sleep apnea (non compliant w/ CPAP) , former smoker,    Pulmonary exam normal breath sounds clear to auscultation       Cardiovascular hypertension (151/77 preop), Pt. on medications and Pt. on home beta blockers + DVT (xarelto, LD 06/14/21)  Normal cardiovascular exam Rhythm:Regular Rate:Normal     Neuro/Psych  Headaches, negative psych ROS   GI/Hepatic Neg liver ROS, GERD  Controlled,GIB- hematochezia   Endo/Other  diabetes, Well Controlled, Type 2, Oral Hypoglycemic Agents  Renal/GU Renal InsufficiencyRenal diseaseCr 1.2  negative genitourinary   Musculoskeletal  (+) Arthritis , Osteoarthritis,    Abdominal   Peds  Hematology  (+) Blood dyscrasia, anemia , INR 2.3. Hgb 8.3 on presentation and decreased to 7.1. Transfusd 2 U PRBCs  Hb today 11.8   Anesthesia Other Findings Allergy to morphine- passes out Does not remember issue with novocaine  Reproductive/Obstetrics negative OB ROS                            Anesthesia Physical Anesthesia Plan  ASA: 3  Anesthesia Plan: MAC   Post-op Pain Management:    Induction:   PONV Risk Score and Plan: 2 and Propofol infusion and TIVA  Airway Management Planned: Natural Airway and Simple Face Mask  Additional Equipment: None  Intra-op Plan:   Post-operative Plan:   Informed Consent: I have reviewed the patients History and Physical, chart, labs and discussed the procedure including the risks, benefits and alternatives for the proposed anesthesia  with the patient or authorized representative who has indicated his/her understanding and acceptance.       Plan Discussed with: CRNA  Anesthesia Plan Comments:        Anesthesia Quick Evaluation

## 2021-06-20 ENCOUNTER — Observation Stay (HOSPITAL_COMMUNITY)
Admission: EM | Admit: 2021-06-20 | Discharge: 2021-06-23 | Disposition: A | Discharge status: Home / Self Care | Payer: Medicare Other | Attending: Internal Medicine | Admitting: Internal Medicine

## 2021-06-20 ENCOUNTER — Encounter (HOSPITAL_COMMUNITY): Payer: Self-pay | Admitting: *Deleted

## 2021-06-20 ENCOUNTER — Other Ambulatory Visit: Payer: Self-pay

## 2021-06-20 DIAGNOSIS — E1122 Type 2 diabetes mellitus with diabetic chronic kidney disease: Secondary | ICD-10-CM | POA: Insufficient documentation

## 2021-06-20 DIAGNOSIS — N183 Chronic kidney disease, stage 3 unspecified: Secondary | ICD-10-CM | POA: Diagnosis not present

## 2021-06-20 DIAGNOSIS — Z87891 Personal history of nicotine dependence: Secondary | ICD-10-CM | POA: Insufficient documentation

## 2021-06-20 DIAGNOSIS — I10 Essential (primary) hypertension: Secondary | ICD-10-CM | POA: Diagnosis present

## 2021-06-20 DIAGNOSIS — I129 Hypertensive chronic kidney disease with stage 1 through stage 4 chronic kidney disease, or unspecified chronic kidney disease: Secondary | ICD-10-CM | POA: Diagnosis not present

## 2021-06-20 DIAGNOSIS — R42 Dizziness and giddiness: Secondary | ICD-10-CM | POA: Insufficient documentation

## 2021-06-20 DIAGNOSIS — Z86718 Personal history of other venous thrombosis and embolism: Secondary | ICD-10-CM | POA: Diagnosis not present

## 2021-06-20 DIAGNOSIS — K625 Hemorrhage of anus and rectum: Secondary | ICD-10-CM | POA: Diagnosis present

## 2021-06-20 DIAGNOSIS — K922 Gastrointestinal hemorrhage, unspecified: Secondary | ICD-10-CM | POA: Diagnosis not present

## 2021-06-20 DIAGNOSIS — Z7984 Long term (current) use of oral hypoglycemic drugs: Secondary | ICD-10-CM | POA: Insufficient documentation

## 2021-06-20 DIAGNOSIS — E1169 Type 2 diabetes mellitus with other specified complication: Secondary | ICD-10-CM

## 2021-06-20 DIAGNOSIS — Z20822 Contact with and (suspected) exposure to covid-19: Secondary | ICD-10-CM | POA: Diagnosis not present

## 2021-06-20 DIAGNOSIS — I152 Hypertension secondary to endocrine disorders: Secondary | ICD-10-CM | POA: Diagnosis present

## 2021-06-20 DIAGNOSIS — E119 Type 2 diabetes mellitus without complications: Secondary | ICD-10-CM

## 2021-06-20 DIAGNOSIS — Z79899 Other long term (current) drug therapy: Secondary | ICD-10-CM | POA: Insufficient documentation

## 2021-06-20 DIAGNOSIS — D62 Acute posthemorrhagic anemia: Secondary | ICD-10-CM | POA: Diagnosis not present

## 2021-06-20 DIAGNOSIS — E1159 Type 2 diabetes mellitus with other circulatory complications: Secondary | ICD-10-CM | POA: Diagnosis present

## 2021-06-20 DIAGNOSIS — E43 Unspecified severe protein-calorie malnutrition: Secondary | ICD-10-CM | POA: Insufficient documentation

## 2021-06-20 LAB — TYPE AND SCREEN
ABO/RH(D): O POS
Antibody Screen: NEGATIVE

## 2021-06-20 LAB — PROTIME-INR
INR: 0.9 (ref 0.8–1.2)
Prothrombin Time: 12.6 seconds (ref 11.4–15.2)

## 2021-06-20 LAB — COMPREHENSIVE METABOLIC PANEL
ALT: 14 U/L (ref 0–44)
AST: 22 U/L (ref 15–41)
Albumin: 3.8 g/dL (ref 3.5–5.0)
Alkaline Phosphatase: 51 U/L (ref 38–126)
Anion gap: 8 (ref 5–15)
BUN: 34 mg/dL — ABNORMAL HIGH (ref 8–23)
CO2: 25 mmol/L (ref 22–32)
Calcium: 9.2 mg/dL (ref 8.9–10.3)
Chloride: 105 mmol/L (ref 98–111)
Creatinine, Ser: 1.56 mg/dL — ABNORMAL HIGH (ref 0.44–1.00)
GFR, Estimated: 33 mL/min — ABNORMAL LOW (ref >60)
Glucose, Bld: 101 mg/dL — ABNORMAL HIGH (ref 70–99)
Potassium: 4.9 mmol/L (ref 3.5–5.1)
Sodium: 138 mmol/L (ref 135–145)
Total Bilirubin: 0.5 mg/dL (ref 0.3–1.2)
Total Protein: 6.9 g/dL (ref 6.5–8.1)

## 2021-06-20 LAB — CBC WITH DIFFERENTIAL/PLATELET
Abs Immature Granulocytes: 0.01 10*3/uL (ref 0.00–0.07)
Basophils Absolute: 0 10*3/uL (ref 0.0–0.1)
Basophils Relative: 1 %
Eosinophils Absolute: 0.4 10*3/uL (ref 0.0–0.5)
Eosinophils Relative: 6 %
HCT: 33.1 % — ABNORMAL LOW (ref 36.0–46.0)
Hemoglobin: 10.7 g/dL — ABNORMAL LOW (ref 12.0–15.0)
Immature Granulocytes: 0 %
Lymphocytes Relative: 23 %
Lymphs Abs: 1.6 10*3/uL (ref 0.7–4.0)
MCH: 29.6 pg (ref 26.0–34.0)
MCHC: 32.3 g/dL (ref 30.0–36.0)
MCV: 91.7 fL (ref 80.0–100.0)
Monocytes Absolute: 0.4 10*3/uL (ref 0.1–1.0)
Monocytes Relative: 6 %
Neutro Abs: 4.3 10*3/uL (ref 1.7–7.7)
Neutrophils Relative %: 64 %
Platelets: 137 10*3/uL — ABNORMAL LOW (ref 150–400)
RBC: 3.61 MIL/uL — ABNORMAL LOW (ref 3.87–5.11)
RDW: 15 % (ref 11.5–15.5)
WBC: 6.8 10*3/uL (ref 4.0–10.5)
nRBC: 0 % (ref 0.0–0.2)

## 2021-06-20 LAB — SURGICAL PATHOLOGY

## 2021-06-20 NOTE — ED Triage Notes (Signed)
Pt with recent history of rectal bleeding and had colonoscopy on 06/17/21 that was inconclusive. Over the weekend, pt had some episodes of rectal bleeding and dizziness. Today the bleeding increased and she called her primary MD and was advised to go to the ED.

## 2021-06-20 NOTE — ED Provider Notes (Signed)
Emergency Medicine Provider Triage Evaluation Note  Stephanie Barber , a 80 y.o. female  was evaluated in triage.  Pt complains of rectal bleeding, had a colonoscopy on 06/17/2021 by Dr. Michail Sermon, reports she has had multiple episodes of bright to dark red blood after every bowel movement, blood located on the toilet bowl.  Prior history of anemia, endorses dizziness along with lightheadedness.  Previously taking Xarelto, however reports this medication has been discontinued for some time.  Review of Systems  Positive: Abdominal pain, rectal bleeding Negative: Fever, nausea, vomiting  Physical Exam  BP (!) 187/80   Pulse 67   Temp 98.1 F (36.7 C)   Resp 16   Ht '4\' 11"'$  (1.499 m)   Wt 47.6 kg   SpO2 96%   BMI 21.21 kg/m  Gen:   Awake, no distress   Resp:  Normal effort  MSK:   Moves extremities without difficulty  Other:    Medical Decision Making  Medically screening exam initiated at 8:20 PM.  Appropriate orders placed.  Stephanie Barber was informed that the remainder of the evaluation will be completed by another provider, this initial triage assessment does not replace that evaluation, and the importance of remaining in the ED until their evaluation is complete.  Prior history of GI bleeding, history of diabetes, currently not on Xarelto.  Recent colonoscopy by Dr. Michail Sermon approximately 3 days ago, here for rectal bleeding.  Type and screen have been ordered.   Stephanie Fitting, PA-C 06/20/21 2024    Stephanie Bo, MD 06/21/21 1026

## 2021-06-21 ENCOUNTER — Encounter (HOSPITAL_COMMUNITY): Payer: Self-pay | Admitting: Internal Medicine

## 2021-06-21 DIAGNOSIS — K922 Gastrointestinal hemorrhage, unspecified: Secondary | ICD-10-CM

## 2021-06-21 DIAGNOSIS — D62 Acute posthemorrhagic anemia: Secondary | ICD-10-CM | POA: Insufficient documentation

## 2021-06-21 DIAGNOSIS — E43 Unspecified severe protein-calorie malnutrition: Secondary | ICD-10-CM | POA: Insufficient documentation

## 2021-06-21 DIAGNOSIS — Z7901 Long term (current) use of anticoagulants: Secondary | ICD-10-CM | POA: Diagnosis not present

## 2021-06-21 DIAGNOSIS — K625 Hemorrhage of anus and rectum: Secondary | ICD-10-CM | POA: Diagnosis not present

## 2021-06-21 LAB — CBC
HCT: 32.3 % — ABNORMAL LOW (ref 36.0–46.0)
HCT: 31.2 % — ABNORMAL LOW (ref 36.0–46.0)
HCT: 33.6 % — ABNORMAL LOW (ref 36.0–46.0)
Hemoglobin: 10.3 g/dL — ABNORMAL LOW (ref 12.0–15.0)
Hemoglobin: 10.2 g/dL — ABNORMAL LOW (ref 12.0–15.0)
Hemoglobin: 11 g/dL — ABNORMAL LOW (ref 12.0–15.0)
MCH: 29.6 pg (ref 26.0–34.0)
MCH: 30.1 pg (ref 26.0–34.0)
MCH: 29.8 pg (ref 26.0–34.0)
MCHC: 31.9 g/dL (ref 30.0–36.0)
MCHC: 32.7 g/dL (ref 30.0–36.0)
MCHC: 32.7 g/dL (ref 30.0–36.0)
MCV: 92.8 fL (ref 80.0–100.0)
MCV: 92 fL (ref 80.0–100.0)
MCV: 91.1 fL (ref 80.0–100.0)
Platelets: 128 10*3/uL — ABNORMAL LOW (ref 150–400)
Platelets: 124 10*3/uL — ABNORMAL LOW (ref 150–400)
Platelets: 138 10*3/uL — ABNORMAL LOW (ref 150–400)
RBC: 3.48 MIL/uL — ABNORMAL LOW (ref 3.87–5.11)
RBC: 3.39 MIL/uL — ABNORMAL LOW (ref 3.87–5.11)
RBC: 3.69 MIL/uL — ABNORMAL LOW (ref 3.87–5.11)
RDW: 15.2 % (ref 11.5–15.5)
RDW: 15.2 % (ref 11.5–15.5)
RDW: 15 % (ref 11.5–15.5)
WBC: 5.5 10*3/uL (ref 4.0–10.5)
WBC: 5.3 10*3/uL (ref 4.0–10.5)
WBC: 5.7 10*3/uL (ref 4.0–10.5)
nRBC: 0 % (ref 0.0–0.2)
nRBC: 0 % (ref 0.0–0.2)
nRBC: 0 % (ref 0.0–0.2)

## 2021-06-21 LAB — GLUCOSE, CAPILLARY
Glucose-Capillary: 81 mg/dL (ref 70–99)
Glucose-Capillary: 119 mg/dL — ABNORMAL HIGH (ref 70–99)
Glucose-Capillary: 111 mg/dL — ABNORMAL HIGH (ref 70–99)
Glucose-Capillary: 114 mg/dL — ABNORMAL HIGH (ref 70–99)

## 2021-06-21 LAB — RESP PANEL BY RT-PCR (FLU A&B, COVID) ARPGX2
Influenza A by PCR: NEGATIVE
Influenza B by PCR: NEGATIVE
SARS Coronavirus 2 by RT PCR: NEGATIVE

## 2021-06-21 MED ORDER — MECLIZINE HCL 25 MG PO TABS
25.0000 mg | ORAL_TABLET | Freq: Every day | ORAL | Status: DC
  Administered 2021-06-21 – 2021-06-23 (×3): 25 mg via ORAL
  Filled 2021-06-21 (×3): qty 1

## 2021-06-21 MED ORDER — METOPROLOL SUCCINATE ER 50 MG PO TB24
50.0000 mg | ORAL_TABLET | Freq: Every day | ORAL | Status: DC
  Administered 2021-06-21 – 2021-06-23 (×3): 50 mg via ORAL
  Filled 2021-06-21 (×3): qty 1

## 2021-06-21 MED ORDER — HYDRALAZINE HCL 20 MG/ML IJ SOLN
10.0000 mg | INTRAMUSCULAR | Status: DC | PRN
  Filled 2021-06-21: qty 1

## 2021-06-21 MED ORDER — ENSURE ENLIVE PO LIQD: 237.0000 mL | Freq: Two times a day (BID) | ORAL | Status: DC

## 2021-06-21 MED ORDER — SERTRALINE HCL 25 MG PO TABS
25.0000 mg | ORAL_TABLET | Freq: Every day | ORAL | Status: DC
  Administered 2021-06-21 – 2021-06-22 (×2): 25 mg via ORAL
  Filled 2021-06-21 (×2): qty 1

## 2021-06-21 MED ORDER — INSULIN ASPART 100 UNIT/ML IJ SOLN
0.0000 [IU] | Freq: Three times a day (TID) | INTRAMUSCULAR | Status: DC
  Administered 2021-06-22: 2 [IU] via SUBCUTANEOUS
  Filled 2021-06-21: qty 0.09

## 2021-06-21 MED ORDER — OMEGA-3 FATTY ACIDS 1000 MG PO CAPS: 1.0000 g | ORAL_CAPSULE | Freq: Every day | ORAL | Status: DC

## 2021-06-21 MED ORDER — SODIUM CHLORIDE 0.9 % IV BOLUS
1000.0000 mL | Freq: Once | INTRAVENOUS | Status: AC
  Administered 2021-06-21: 1000 mL via INTRAVENOUS

## 2021-06-21 MED ORDER — ENSURE ENLIVE PO LIQD
237.0000 mL | Freq: Three times a day (TID) | ORAL | Status: DC
  Administered 2021-06-21 – 2021-06-23 (×4): 237 mL via ORAL

## 2021-06-21 MED ORDER — ACETAMINOPHEN 650 MG RE SUPP: 650.0000 mg | Freq: Four times a day (QID) | RECTAL | Status: DC | PRN

## 2021-06-21 MED ORDER — GABAPENTIN 400 MG PO CAPS
400.0000 mg | ORAL_CAPSULE | Freq: Every day | ORAL | Status: DC
  Administered 2021-06-21 – 2021-06-22 (×2): 400 mg via ORAL
  Filled 2021-06-21 (×2): qty 1

## 2021-06-21 MED ORDER — ACETAMINOPHEN 325 MG PO TABS: 650.0000 mg | ORAL_TABLET | Freq: Four times a day (QID) | ORAL | Status: DC | PRN

## 2021-06-21 MED ORDER — AMLODIPINE BESYLATE 10 MG PO TABS
10.0000 mg | ORAL_TABLET | Freq: Every day | ORAL | Status: DC
  Administered 2021-06-21 – 2021-06-23 (×3): 10 mg via ORAL
  Filled 2021-06-21 (×3): qty 1

## 2021-06-21 MED ORDER — ADULT MULTIVITAMIN W/MINERALS CH
1.0000 | ORAL_TABLET | Freq: Every day | ORAL | Status: DC
  Administered 2021-06-21 – 2021-06-23 (×3): 1 via ORAL
  Filled 2021-06-21 (×3): qty 1

## 2021-06-21 MED ORDER — OMEGA-3-ACID ETHYL ESTERS 1 G PO CAPS
1.0000 g | ORAL_CAPSULE | Freq: Every day | ORAL | Status: DC
  Administered 2021-06-21 – 2021-06-23 (×3): 1 g via ORAL
  Filled 2021-06-21 (×3): qty 1

## 2021-06-21 MED ORDER — HYDROCORTISONE ACETATE 25 MG RE SUPP
25.0000 mg | Freq: Every day | RECTAL | Status: DC
  Administered 2021-06-21 – 2021-06-22 (×2): 25 mg via RECTAL
  Filled 2021-06-21 (×2): qty 1

## 2021-06-21 MED ORDER — BOOST / RESOURCE BREEZE PO LIQD CUSTOM
1.0000 | Freq: Three times a day (TID) | ORAL | Status: DC
  Administered 2021-06-21: 1 via ORAL

## 2021-06-21 MED ORDER — PRAVASTATIN SODIUM 40 MG PO TABS
40.0000 mg | ORAL_TABLET | Freq: Every day | ORAL | Status: DC
  Administered 2021-06-21 – 2021-06-22 (×2): 40 mg via ORAL
  Filled 2021-06-21 (×2): qty 1

## 2021-06-21 MED ORDER — DIAZEPAM 2 MG PO TABS
1.0000 mg | ORAL_TABLET | Freq: Every day | ORAL | Status: DC
  Administered 2021-06-21 – 2021-06-22 (×2): 1 mg via ORAL
  Filled 2021-06-21 (×2): qty 1

## 2021-06-21 NOTE — ED Notes (Signed)
ED TO INPATIENT HANDOFF REPORT  Name/Age/Gender Stephanie Barber 80 y.o. female  Code Status    Code Status Orders  (From admission, onward)         Start     Ordered   06/21/21 0205  Full code  Continuous        06/21/21 0205        Code Status History    Date Active Date Inactive Code Status Order ID Comments User Context   06/15/2021 2318 06/17/2021 2108 Full Code [id number]  Lenore Cordia, MD ED   05/06/2017 1632 05/07/2017 1835 Full Code [id number]  Elwin Mocha, MD ED   01/30/2017 1320 02/02/2017 2139 Full Code [id number]  Ashok Pall, MD Inpatient    Advance Directive Documentation   Flowsheet Row Most Recent Value  Type of Advance Directive Living will, Healthcare Power of Attorney  Pre-existing out of facility DNR order (yellow form or pink MOST form) --  "MOST" Form in Place? --      Home/SNF/Other Home  Chief Complaint Acute GI bleeding [K92.2]  Level of Care/Admitting Diagnosis ED Disposition    ED Disposition  Admit   Condition  --   [place]: [facility] [[id number]]  Level of Care: Telemetry [5]  Admit to tele based on following criteria: Monitor QTC interval  May place patient in observation at [facility] or [facility] if equivalent level of care is available:: No  Covid Evaluation: Asymptomatic Screening Protocol (No Symptoms)  Diagnosis: Acute GI bleeding [id number]  Admitting Physician: Rise Patience [phone]  Attending Physician: Rise Patience [phone]         Medical History Past Medical History:  Diagnosis Date  . Arthritis   . Chronic headache   . Complication of anesthesia    allergy to Novocaine   . Diabetes mellitus   . GERD (gastroesophageal reflux disease)   . Heart murmur   . High cholesterol   . Hyperlipidemia   . Hypertension   . Intracranial atherosclerosis    per MRA  . Migraine   . Narcolepsy and cataplexy   . OSA on CPAP    states has not used CPAP in over a year  .  Rectal prolapse   . Trigger finger of all digits of right hand     Allergies Allergies  Allergen Reactions  . Morphine And Related Anaphylaxis    IV Location/Drains/Wounds Patient Lines/Drains/Airways Status    Active Line/Drains/Airways    Name Placement date Placement time Site Days   Peripheral IV 06/21/21 20 G Left Antecubital 06/21/21  0149  Antecubital  less than 1   External Urinary Catheter 06/16/21  0858  --  5   Incision (Closed) 04/30/18 Hand Right 04/30/18  1153  -- 1148          Labs/Imaging Results for orders placed or performed during the hospital encounter of 06/20/21 (from the past 48 hour(s))  CBC with Differential     Status: Abnormal   Collection Time: 06/20/21  8:39 PM  Result Value Ref Range   WBC 6.8 4.0 - 10.5 K/uL   RBC 3.61 (L) 3.87 - 5.11 MIL/uL   Hemoglobin 10.7 (L) 12.0 - 15.0 g/dL   HCT 33.1 (L) 36.0 - 46.0 %   MCV 91.7 80.0 - 100.0 fL   MCH 29.6 26.0 - 34.0 pg   MCHC 32.3 30.0 - 36.0 g/dL   RDW 15.0 11.5 - 15.5 %   Platelets 137 (  L) 150 - 400 K/uL   nRBC 0.0 0.0 - 0.2 %   Neutrophils Relative % 64 %   Neutro Abs 4.3 1.7 - 7.7 K/uL   Lymphocytes Relative 23 %   Lymphs Abs 1.6 0.7 - 4.0 K/uL   Monocytes Relative 6 %   Monocytes Absolute 0.4 0.1 - 1.0 K/uL   Eosinophils Relative 6 %   Eosinophils Absolute 0.4 0.0 - 0.5 K/uL   Basophils Relative 1 %   Basophils Absolute 0.0 0.0 - 0.1 K/uL   Immature Granulocytes 0 %   Abs Immature Granulocytes 0.01 0.00 - 0.07 K/uL    Comment: Performed at [facility], [place] [address] [place], [place] [address]  Comprehensive metabolic panel     Status: Abnormal   Collection Time: 06/20/21  8:39 PM  Result Value Ref Range   Sodium 138 135 - 145 mmol/L   Potassium 4.9 3.5 - 5.1 mmol/L   Chloride 105 98 - 111 mmol/L   CO2 25 22 - 32 mmol/L   Glucose, Bld 101 (H) 70 - 99 mg/dL    Comment: Glucose reference range applies only to samples taken after fasting for at least 8 hours.    BUN 34 (H) 8 - 23 mg/dL   Creatinine, Ser 1.56 (H) 0.44 - 1.00 mg/dL   Calcium 9.2 8.9 - 10.3 mg/dL   Total Protein 6.9 6.5 - 8.1 g/dL   Albumin 3.8 3.5 - 5.0 g/dL   AST 22 15 - 41 U/L   ALT 14 0 - 44 U/L   Alkaline Phosphatase 51 38 - 126 U/L   Total Bilirubin 0.5 0.3 - 1.2 mg/dL   GFR, Estimated 33 (L) >60 mL/min    Comment: (NOTE) Calculated using the CKD-EPI Creatinine Equation (2021)    Anion gap 8 5 - 15    Comment: Performed at [facility] [place] [address] [place], [place] [address]  Type and screen [place]     Status: None   Collection Time: 06/20/21  8:39 PM  Result Value Ref Range   ABO/RH(D) O POS    Antibody Screen NEG    Sample Expiration      06/23/2021,2359 Performed at [facility], [place] [address], [place], [place] [address]   Protime-INR     Status: None   Collection Time: 06/20/21  8:39 PM  Result Value Ref Range   Prothrombin Time 12.6 11.4 - 15.2 seconds   INR 0.9 0.8 - 1.2    Comment: (NOTE) INR goal varies based on device and disease states. Performed at [facility], [place] [address] [place], [place] [address]    No results found.  Pending Labs Unresulted Labs (From admission, onward)    Start     Ordered   06/21/21 0205  CBC  Now then every 4 hours,   R (with STAT occurrences)      06/21/21 0205   06/21/21 0107  Resp Panel by RT-PCR (Flu A&B, Covid) Nasopharyngeal Swab  (Tier 2 - Symptomatic/asymptomatic with Precautions )  Once,   STAT       Question Answer Comment  Is this test for diagnosis or screening Screening   Symptomatic for COVID-19 as defined by CDC No   Hospitalized for COVID-19 No   Admitted to ICU for COVID-19 No   Previously tested for COVID-19 Yes   Resident in a congregate (group) care setting Unknown   Employed in healthcare setting Unknown  Pregnant No   Has patient completed COVID vaccination(s) (2 doses of Pfizer/Moderna 1 dose of Johnson [employer]  Johnson) Unknown      06/21/21 0106          Vitals/Pain Today's Vitals   06/20/21 2006 06/20/21 2014 06/20/21 2250  BP: (!) 187/80  (!) 179/77  Pulse: 67  61  Resp: 16  16  Temp: 98.1 F (36.7 C)    SpO2: 96%  100%  Weight:  47.6 kg   Height:  '4\' 11"'$  (1.499 m)   PainSc:  0-No pain     Isolation Precautions No active isolations  Medications Medications  metoprolol succinate (TOPROL-XL) 24 hr tablet 50 mg (has no administration in time range)  pravastatin (PRAVACHOL) tablet 40 mg (has no administration in time range)  amLODipine (NORVASC) tablet 10 mg (has no administration in time range)  diazepam (VALIUM) tablet 1 mg (has no administration in time range)  sertraline (ZOLOFT) tablet 25 mg (has no administration in time range)  gabapentin (NEURONTIN) capsule 400 mg (has no administration in time range)  acetaminophen (TYLENOL) tablet 650 mg (has no administration in time range)    Or  acetaminophen (TYLENOL) suppository 650 mg (has no administration in time range)  insulin aspart (novoLOG) injection 0-9 Units (has no administration in time range)  omega-3 acid ethyl esters (LOVAZA) capsule 1 g (has no administration in time range)  sodium chloride 0.9 % bolus 1,000 mL (1,000 mLs Intravenous New Bag/Given 06/21/21 0149)    Mobility walks

## 2021-06-21 NOTE — Progress Notes (Signed)
Patient placed in observation after midnight, please see H&P.  Here with rectal bleeding.  Also, c/o dizziness.  Will get PT vestibular eval.  GI consult appreciated. Eulogio Bear DO

## 2021-06-21 NOTE — Progress Notes (Addendum)
Initial Nutrition Assessment  DOCUMENTATION CODES:  Severe malnutrition in context of chronic illness  INTERVENTION:  Continue to advance diet as medically able and as tolerated.  Discontinue Boost Breeze TID.  Increase Ensure Plus from BID to TID.  Add Magic cup TID with meals, each supplement provides 290 kcal and 9 grams of protein.  Add MVI with minerals daily.  NUTRITION DIAGNOSIS:  Severe Malnutrition related to chronic illness as evidenced by moderate fat depletion, moderate muscle depletion, severe muscle depletion.  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 Recent discharge with diverticulosis without bleeding on 8/5. Pt again with divericulosis but now with acute GI bleeding.  Pt re-admitted. Pt just seen by another RD during previous visit.  Per pt, nothing had really changed from previous interview with other RD. See previous RD note.  She reports UBW of 135 lb and that over the past 8-9 months she has been steadily losing weight. Weight today is 105 lb and weight on 4/28 was 110 lb. This indicates 5 lb weight loss (4.5% body weight) in ~3 months.  Conducted NFPE again to obtain all information. Pt with severe depletions in legs.  Recommend increasing Ensure Plus from BID to TID; adding Magic Cup TID and MVI with minerals daily. Also discontinue Boost Breeze TID.  Medications: reviewed; Valium, BB TID, EP BID, SSI, Lovaza  Labs: reviewed; CBG 81-190 HbA1c: 6.2% (04/2015)  NUTRITION - FOCUSED PHYSICAL EXAM: Flowsheet Row Most Recent Value  Orbital Region Mild depletion  Upper Arm Region Moderate depletion  Thoracic and Lumbar Region  Moderate depletion  Buccal Region Mild depletion  Temple Region Mild depletion  Clavicle Bone Region Moderate depletion  Clavicle and Acromion Bone Region Moderate depletion  Scapular Bone Region Moderate depletion  Dorsal Hand Mild depletion  Patellar Region Severe depletion  Anterior Thigh Region Severe depletion  Posterior Calf Region Severe depletion  Edema (RD Assessment) None  Hair Reviewed  Eyes Reviewed  Mouth Reviewed  Skin Reviewed  Nails Reviewed   Diet Order:   Diet Order             Diet full liquid Room service appropriate? Yes; Fluid consistency: Thin  Diet effective now                  EDUCATION NEEDS:  Education needs have been addressed  Skin:  Skin Assessment: Reviewed RN Assessment  Last BM:  06/21/21 - Type 6, brown/red, small  Height:  Ht Readings from Last 1 Encounters:  06/21/21 4' 11.02" (1.499 m)   Weight:  Wt Readings from Last 1 Encounters:  06/21/21 47.6 kg   BMI:  Body mass index is 21.18 kg/m.  Estimated Nutritional Needs:  Kcal:  1400-1600 Protein:  70-85 grams Fluid:  >1.4 L  Derrel Nip, RD, LDN (she/her/hers) Registered Dietitian I After-Hours/Weekend Pager # in [place]

## 2021-06-21 NOTE — ED Provider Notes (Signed)
[facility] Emergency Department Provider Note MRN:  [id number]  Arrival date & time: 06/21/21     Chief Complaint   Rectal Bleeding   History of Present Illness   SAMEEHA WICHMANN is a 80 y.o. year-old female with a history of DVT presenting to the ED with chief complaint of rectal bleed.  Multiple episodes of large-volume bright red blood per rectum yesterday and this evening.  Was just in the hospital admitted for gastrointestinal bleeding.  Was taken off of her blood thinner, last dose was Tuesday of last week.  Feeling lightheaded.  Denies any abdominal pain, no rectal pain, no chest pain, no shortness of breath, no other complaints.  Review of Systems  A complete 10 system review of systems was obtained and all systems are negative except as noted in the HPI and PMH.   Patient's Health History    Past Medical History:  Diagnosis Date   Arthritis    Chronic headache    Complication of anesthesia    allergy to Novocaine    Diabetes mellitus    GERD (gastroesophageal reflux disease)    Heart murmur    High cholesterol    Hyperlipidemia    Hypertension    Intracranial atherosclerosis    per MRA   Migraine    Narcolepsy and cataplexy    OSA on CPAP    states has not used CPAP in over a year   Rectal prolapse    Trigger finger of all digits of right hand     Past Surgical History:  Procedure Laterality Date   ABDOMINAL HYSTERECTOMY     BACK SURGERY     L4,L5 discectomy   CARDIAC CATHETERIZATION     CARPAL TUNNEL RELEASE Right 01/18/2017   KIDNEY SURGERY  1998   Right.  growth removed   rectal prolapse repair  12/05/11   ROTATOR CUFF REPAIR     Left   SHOULDER ARTHROSCOPY WITH SUBACROMIAL DECOMPRESSION, ROTATOR CUFF REPAIR AND BICEP TENDON REPAIR Left 05/25/2015   Procedure: SHOULDER ARTHROSCOPY WITH SUBACROMIAL DECOMPRESSION, ROTATOR CUFF REPAIR AND BICEP TENODESIS, DEBRIDEMENT. ;  Surgeon: Meredith Pel, MD;  Location: [place];   Service: Orthopedics;  Laterality: Left;  LEFT SHOULDER ROTATOR CUFF TEAR REPAIR, ARTHROSCOPY, DEBRIDEMENT, BICEPS TENODESIS, SUBACROMIAL DECOMPRESSION.   TOTAL ABDOMINAL HYSTERECTOMY     TRIGGER FINGER RELEASE Right 04/30/2018   Procedure: RELEASE TRIGGER FINGER/A-1 PULLEY RIGHT MIDDLE  AND SMALLn ;  Surgeon: Daryll Brod, MD;  Location: [place];  Service: Orthopedics;  Laterality: Right;  FAB, Bier block   TUBAL LIGATION      Family History  Problem Relation Age of Onset   Heart disease Mother    Throat cancer Father    Cancer Father        stomach   Kidney cancer Brother    Heart disease Brother     Social History   Socioeconomic History   Marital status: Married    Spouse name: [name]   Number of children: 4   Years of education: 14   Highest education level: Not on file  Occupational History   Occupation: retired. prev worked at [employer].  Tobacco Use   Smoking status: Former    Types: Cigarettes    Quit date: 11/13/1980    Years since quitting: 40.6   Smokeless tobacco: Never   Tobacco comments:    quit in 1982  Substance and Sexual Activity   Alcohol use: No  Alcohol/week: 0.0 standard drinks   Drug use: No   Sexual activity: Yes  Other Topics Concern   Not on file  Social History Narrative   21 st January 2014 , patient underwent PS and MSLT - MSLT had one  SREMs and an average time to fall asleep of  *.8 minutes , her Ewort is 20 points,: facit: this patient has severe hypersomnia and is at risk when driving. medication in form ogf nuvigil smaples had been dispensed to her but she ha not yet taken it. 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 In detail -discussion of diagnosis and treatment  takes place today , first with nuvigil and if insufficient, with XYREM. Her CPAP treats her OSA very  well, and she uses it 6 hours  or more each night,  residual AHI of 1.1  would not allow for OSA to be still explaining this degree of sleepiness. 2 downloads were reviewed. labs reviewed.    Patient is married [name]) and lives at home with her husband and grandchild.   Patient has four children   Patient is retired.   Patient has a college education.   Patient is right-handed.   Patient does not drink any caffeine.      Social Determinants of Health   Financial Resource Strain: Not on file  Food Insecurity: Not on file  Transportation Needs: Not on file  Physical Activity: Not on file  Stress: Not on file  Social Connections: Not on file  Intimate Partner Violence: Not on file     Physical Exam   Vitals:   06/20/21 2006 06/20/21 2250  BP: (!) 187/80 (!) 179/77  Pulse: 67 61  Resp: 16 16  Temp: 98.1 F (36.7 C)   SpO2: 96% 100%    CONSTITUTIONAL: Well-appearing, NAD NEURO:  Alert and oriented x 3, no focal deficits EYES:  eyes equal and reactive ENT/NECK:  no LAD, no JVD CARDIO: Regular rate, well-perfused, normal S1 and S2 PULM:  CTAB no wheezing or rhonchi GI/GU:  normal bowel sounds, non-distended, non-tender MSK/SPINE:  No gross deformities, no edema SKIN:  no rash, atraumatic PSYCH:  Appropriate speech and behavior  *Additional and/or pertinent findings included in MDM below  Diagnostic and Interventional Summary    EKG Interpretation  Date/Time:    Ventricular Rate:    PR Interval:    QRS Duration:   QT Interval:    QTC Calculation:   R Axis:     Text Interpretation:         Labs Reviewed  CBC WITH DIFFERENTIAL/PLATELET - Abnormal; Notable for the following components:      Result Value   RBC 3.61 (*)    Hemoglobin 10.7 (*)    HCT 33.1 (*)    Platelets 137 (*)    All other components within normal limits  COMPREHENSIVE METABOLIC PANEL - Abnormal; Notable for the following components:   Glucose, Bld 101 (*)    BUN 34 (*)    Creatinine, Ser  1.56 (*)    GFR, Estimated 33 (*)    All other components within normal limits  RESP PANEL BY RT-PCR (FLU A&B, COVID) [id number]  PROTIME-INR  POC OCCULT BLOOD, ED  TYPE AND SCREEN    No orders to display    Medications  sodium chloride 0.9 % bolus 1,000  mL (has no administration in time range)     Procedures  /  Critical Care Procedures  ED Course and Medical Decision Making  I have reviewed the triage vital signs, the nursing notes, and pertinent available records from the EMR.  Listed above are laboratory and imaging tests that I personally ordered, reviewed, and interpreted and then considered in my medical decision making (see below for details).  Suspect recurrence of diverticular bleed.  Patient is no longer on anticoagulation.  She is hemodynamically stable that she feels lightheaded.  Her hemoglobin has dropped one-point since 5 August.  Required 3 units of blood transfused during her last admission.  Also has AKI.  Providing fluids, will admit to hospitalist service.       Barth Kirks. Sedonia Small, [place] [email or web address]  Final Clinical Impressions(s) / ED Diagnoses     ICD-10-CM   1. Gastrointestinal hemorrhage, unspecified gastrointestinal hemorrhage type  K92.2     2. Acute blood loss anemia  D62       ED Discharge Orders     None        Discharge Instructions Discussed with and Provided to Patient:   Discharge Instructions   None       Maudie Flakes, MD 06/21/21 [phone]

## 2021-06-21 NOTE — Evaluation (Signed)
Physical Therapy Vestibular Evaluation Patient Details Name: Stephanie Barber MRN: [id number] DOB: 11/21/1940 Today's Date: 06/21/2021   History of Present Illness  80 y.o. female admitted 06/20/21 for acute GI bleeding.  PMHx of hypertension, diabetes mellitus type 2, chronic disease, sleep apnea and DVT of the lower extremity was recently admitted for GI bleed and underwent EGD and colonoscopy was discharged 4 days ago after receiving blood transfusions presents to the ER after patient experiencing 3 episodes of painless rectal bleed at home.  Patient has not taken Xarelto since last admission last week.  Clinical Impression  Pt admitted with above diagnosis. Pt currently with functional limitations due to the deficits listed below (see PT Problem List). Pt will benefit from skilled PT to increase their independence and safety with mobility to allow discharge to the venue listed below.  Pt reports being admitted one week ago for GI bleeding.  She states she had rectal bleeding in the bathroom on that admission and "passed out" falling forward off the toilet and hitting her head.  Pt also describes her symptoms to be spinning, short lived, and positional in nature.  [name] testing revealed bilateral rotary nystagmus lasting less than one minute indicating right and left posterior canal BPPV.  Pt required supine rest break after positional testing so will return to treat right side which pt reports is worst for symptoms.     Vestibular Assessment - 06/21/21 1502       Vestibular Assessment   General Observation Dizziness started one week ago, spontaneous onset.  Pt in [facility] a week for rectal bleeding and "passed out" and reports sitting on the toilet and passed out leanding forward.      Symptom Behavior   Type of Dizziness  Imbalance;Spinning    Frequency of Dizziness getting up or moving around    Duration of Dizziness only when moving    Symptom Nature Positional;Motion provoked     Aggravating Factors Activity in general;Turning body quickly;Turning head quickly;Sit to stand;Forward bending    Relieving Factors Lying supine;Head stationary;Closing eyes    Progression of Symptoms No change since onset    History of similar episodes none      Oculomotor Exam   Oculomotor Alignment Normal    Spontaneous Absent    Gaze-induced  Age appropriate nystagmus at end range    Head shaking Horizontal Absent    Smooth Pursuits Intact      Positional Testing   Dix-Hallpike Dix-Hallpike Right;Dix-Hallpike Left    Horizontal Canal Testing Horizontal Canal Right;Horizontal Canal Left      Dix-Hallpike Right   Dix-Hallpike Right Duration 10 sec    Dix-Hallpike Right Symptoms --   half circular rotatory nystagmus, pt reports worst side     Dix-Hallpike Left   Dix-Hallpike Left Duration 30 sec    Dix-Hallpike Left Symptoms Upbeat, right rotatory nystagmus      Horizontal Canal Right   Horizontal Canal Right Symptoms Normal      Horizontal Canal Left   Horizontal Canal Left Symptoms Normal                 Follow Up Recommendations Outpatient PT (vsetibular OPPT if symptoms persist)    Equipment Recommendations  None recommended by PT    Recommendations for Other Services       Precautions / Restrictions Precautions Precautions: Fall      Mobility  Bed Mobility Overal bed mobility: Modified Independent  Transfers Overall transfer level: Needs assistance Equipment used: None Transfers: Sit to/from Stand Sit to Stand: Supervision         General transfer comment: supervision for safety  Ambulation/Gait                Stairs            Wheelchair Mobility    Modified Rankin (Stroke Patients Only)       Balance                                             Pertinent Vitals/Pain Pain Assessment: No/denies pain    Home Living Family/patient expects to be discharged to:: Private  residence Living Arrangements: Spouse/significant other Available Help at Discharge: Family Type of Home: House       Home Layout: One level Home Equipment: None      Prior Function Level of Independence: Independent               Hand Dominance        Extremity/Trunk Assessment   Upper Extremity Assessment Upper Extremity Assessment: Overall WFL for tasks assessed    Lower Extremity Assessment Lower Extremity Assessment: Overall WFL for tasks assessed    Cervical / Trunk Assessment Cervical / Trunk Assessment: Normal  Communication   Communication: No difficulties  Cognition Arousal/Alertness: Awake/alert Behavior During Therapy: WFL for tasks assessed/performed Overall Cognitive Status: Within Functional Limits for tasks assessed                                        General Comments      Exercises     Assessment/Plan    PT Assessment Patient needs continued PT services  PT Problem List Decreased mobility;Decreased balance;Decreased knowledge of use of DME       PT Treatment Interventions Gait training;DME instruction;Therapeutic exercise;Functional mobility training;Therapeutic activities;Patient/family education    PT Goals (Current goals can be found in the Care Plan section)  Acute Rehab PT Goals Patient Stated Goal: stop feeling dizzy PT Goal Formulation: With patient Time For Goal Achievement: 07/05/21 Potential to Achieve Goals: Good    Frequency Min 4X/week   Barriers to discharge        Co-evaluation               AM-PAC PT "6 Clicks" Mobility  Outcome Measure Help needed turning from your back to your side while in a flat bed without using bedrails?: None Help needed moving from lying on your back to sitting on the side of a flat bed without using bedrails?: None Help needed moving to and from a bed to a chair (including a wheelchair)?: A Little Help needed standing up from a chair using your arms (e.g.,  wheelchair or bedside chair)?: A Little Help needed to walk in hospital room?: A Little Help needed climbing 3-5 steps with a railing? : A Little 6 Click Score: 20    End of Session Equipment Utilized During Treatment: Gait belt Activity Tolerance: Patient tolerated treatment well Patient left: in bed;with call bell/phone within reach;with family/visitor present   PT Visit Diagnosis: BPPV BPPV - Right/Left : Right;Left    Time: [id number] PT Time Calculation (min) (ACUTE ONLY): 21 min   Charges:   PT Evaluation $PT Eval  Moderate Complexity: 1 Mod        Kati PT, DPT Acute Rehabilitation Services Pager: [phone] Office: [phone]   Trena Platt 06/21/2021, 4:18 PM

## 2021-06-21 NOTE — H&P (Signed)
History and Physical    RAYE ERICKSEN [id number] DOB: Aug 23, 1941 DOA: 06/20/2021  PCP: [name], MD  Patient coming from: Home.  Chief Complaint: Rectal bleeding.  HPI: Stephanie Barber is a 80 y.o. female with history of hypertension, diabetes mellitus type 2, chronic disease, sleep apnea and DVT of the lower extremity was recently admitted for GI bleed and underwent EGD and colonoscopy was discharged 4 days ago after receiving blood transfusions presents to the ER after patient experiencing 3 episodes of painless rectal bleed at home.  Patient has not taken Xarelto since last admission last week.  Patient has been having chronic dizziness over the last 2 weeks now.  Denies any chest pain abdominal pain nausea vomiting.  ED Course: In the ER patient's hemoglobin was 1 g drop from the recent past, it was around 10.7 and 4 days ago it was 11.8.  Creatinine has worsened it is around 1.5 at discharge it was 1.2.  COVID test negative.  Patient admitted for further observation for rectal bleeding.  Review of Systems: As per HPI, rest all negative.   Past Medical History:  Diagnosis Date   Arthritis    Chronic headache    Complication of anesthesia    allergy to Novocaine    Diabetes mellitus    GERD (gastroesophageal reflux disease)    Heart murmur    High cholesterol    Hyperlipidemia    Hypertension    Intracranial atherosclerosis    per MRA   Migraine    Narcolepsy and cataplexy    OSA on CPAP    states has not used CPAP in over a year   Rectal prolapse    Trigger finger of all digits of right hand     Past Surgical History:  Procedure Laterality Date   ABDOMINAL HYSTERECTOMY     BACK SURGERY     L4,L5 discectomy   CARDIAC CATHETERIZATION     CARPAL TUNNEL RELEASE Right 01/18/2017   KIDNEY SURGERY  1998   Right.  growth removed   rectal prolapse repair  12/05/11   ROTATOR CUFF REPAIR     Left   SHOULDER ARTHROSCOPY WITH SUBACROMIAL DECOMPRESSION, ROTATOR  CUFF REPAIR AND BICEP TENDON REPAIR Left 05/25/2015   Procedure: SHOULDER ARTHROSCOPY WITH SUBACROMIAL DECOMPRESSION, ROTATOR CUFF REPAIR AND BICEP TENODESIS, DEBRIDEMENT. ;  Surgeon: Meredith Pel, MD;  Location: [facility];  Service: Orthopedics;  Laterality: Left;  LEFT SHOULDER ROTATOR CUFF TEAR REPAIR, ARTHROSCOPY, DEBRIDEMENT, BICEPS TENODESIS, SUBACROMIAL DECOMPRESSION.   TOTAL ABDOMINAL HYSTERECTOMY     TRIGGER FINGER RELEASE Right 04/30/2018   Procedure: RELEASE TRIGGER FINGER/A-1 PULLEY RIGHT MIDDLE  AND SMALLn ;  Surgeon: Daryll Brod, MD;  Location: [facility];  Service: Orthopedics;  Laterality: Right;  FAB, Bier block   TUBAL LIGATION       reports that she quit smoking about 40 years ago. Her smoking use included cigarettes. She has never used smokeless tobacco. She reports that she does not drink alcohol and does not use drugs.  Allergies  Allergen Reactions   Morphine And Related Anaphylaxis    Family History  Problem Relation Age of Onset   Heart disease Mother    Throat cancer Father    Cancer Father        stomach   Kidney cancer Brother    Heart disease Brother     Prior to Admission medications   Medication Sig Start Date End Date Taking? Authorizing Provider  acetaminophen (TYLENOL) 500  MG tablet Take 500 mg by mouth every 6 (six) hours as needed for moderate pain.   Yes [provider]  amLODipine (NORVASC) 10 MG tablet Take 10 mg by mouth daily after breakfast.   Yes [provider]  celecoxib (CELEBREX) 200 MG capsule Take 200 mg by mouth daily.   Yes [provider]  Cinnamon 500 MG capsule Take 500 mg by mouth daily.   Yes [provider]  diazepam (VALIUM) 2 MG tablet Take 1 mg by mouth at bedtime. 02/28/21  Yes [provider]  fish oil-omega-3 fatty acids 1000 MG capsule Take 1 g by mouth daily.   Yes [provider]  gabapentin (NEURONTIN) 400 MG capsule Take 400 mg by mouth at bedtime.    Yes [provider]  loperamide (IMODIUM A-D) 2 MG tablet Take 2 mg by mouth 4 (four) times daily as needed for diarrhea or loose stools.   Yes [provider]  losartan (COZAAR) 100 MG tablet Take 100 mg by mouth daily after breakfast.   Yes [provider]  metFORMIN (GLUCOPHAGE) 500 MG tablet Take 500 mg by mouth 2 (two) times daily with a meal.   Yes [provider]  metoprolol (TOPROL-XL) 50 MG 24 hr tablet Take 50 mg by mouth daily after breakfast.   Yes [provider]  Polyethyl Glycol-Propyl Glycol 0.4-0.3 % SOLN Place 1 drop into both eyes daily.   Yes [provider]  pravastatin (PRAVACHOL) 40 MG tablet Take 40 mg by mouth at bedtime.   Yes [provider]  sertraline (ZOLOFT) 25 MG tablet Take 25 mg by mouth at bedtime. 02/28/21  Yes [provider]  traMADol (ULTRAM) 50 MG tablet Take 50 mg by mouth daily as needed for moderate pain.   Yes [provider]    Physical Exam: Constitutional: Moderately built and nourished. Vitals:   06/20/21 2006 06/20/21 2014 06/20/21 2250  BP: (!) 187/80  (!) 179/77  Pulse: 67  61  Resp: 16  16  Temp: 98.1 F (36.7 C)    SpO2: 96%  100%  Weight:  47.6 kg   Height:  '4\' 11"'$  (1.499 m)    Eyes: Anicteric no pallor. ENMT: No discharge from the ears eyes nose or mouth. Neck: No mass felt.  No neck rigidity. Respiratory: No rhonchi or crepitations. Cardiovascular: S1-S2 heard. Abdomen: Soft nontender bowel sound present. Musculoskeletal: No edema. Skin: No rash. Neurologic: Alert awake oriented to time place and person.  Moves all extremities. Psychiatric: Appears normal.  Normal affect.   Labs on Admission: I have personally reviewed following labs and imaging studies  CBC: Recent Labs  Lab 06/15/21 1237 06/15/21 1506 06/15/21 2236 06/16/21 0611 06/16/21 1026 06/16/21 1620 06/16/21 1843 06/17/21 0401 06/20/21 2039  WBC 4.7  --  8.3 7.8  --   --    --   --  6.8  NEUTROABS 2.9  --   --   --   --   --   --   --  4.3  HGB 8.3*   < > 7.1* 9.7* 8.5* 7.2* 7.6* 11.8* 10.7*  HCT 26.5*   < > 22.7* 30.1* 26.5* 22.2* 23.2* 35.0* 33.1*  MCV 84.7  --  85.7 89.6  --   --   --   --  91.7  PLT 231  --  203 198  --   --   --   --  137*   < > = values in  this interval not displayed.   Basic Metabolic Panel: Recent Labs  Lab 06/15/21 1237 06/16/21 0611 06/20/21 2039  NA 139 140 138  K 4.5 4.5 4.9  CL 106 109 105  CO2 24 19* 25  GLUCOSE 87 76 101*  BUN 23 22 34*  CREATININE 1.22* 1.20* 1.56*  CALCIUM 9.1 9.3 9.2   GFR: Estimated Creatinine Clearance: 19.3 mL/min (A) (by C-G formula based on SCr of 1.56 mg/dL (H)). Liver Function Tests: Recent Labs  Lab 06/15/21 1237 06/20/21 2039  AST 21 22  ALT 13 14  ALKPHOS 49 51  BILITOT 0.4 0.5  PROT 7.5 6.9  ALBUMIN 4.0 3.8   No results for input(s): LIPASE, AMYLASE in the last 168 hours. No results for input(s): AMMONIA in the last 168 hours. Coagulation Profile: Recent Labs  Lab 06/15/21 1237 06/16/21 1620 06/20/21 2039  INR 2.3* 1.2 0.9   Cardiac Enzymes: No results for input(s): CKTOTAL, CKMB, CKMBINDEX, TROPONINI in the last 168 hours. BNP (last 3 results) No results for input(s): PROBNP in the last 8760 hours. HbA1C: No results for input(s): HGBA1C in the last 72 hours. CBG: Recent Labs  Lab 06/16/21 1922 06/16/21 2329 06/17/21 0400 06/17/21 0817 06/17/21 1204  GLUCAP 101* 84 99 190* 95   Lipid Profile: No results for input(s): CHOL, HDL, LDLCALC, TRIG, CHOLHDL, LDLDIRECT in the last 72 hours. Thyroid Function Tests: No results for input(s): TSH, T4TOTAL, FREET4, T3FREE, THYROIDAB in the last 72 hours. Anemia Panel: No results for input(s): VITAMINB12, FOLATE, FERRITIN, TIBC, IRON, RETICCTPCT in the last 72 hours. Urine analysis:    Component Value Date/Time   COLORURINE STRAW (A) 03/10/2021 1432   APPEARANCEUR CLEAR 03/10/2021 1432   LABSPEC 1.009 03/10/2021  1432   PHURINE 5.0 03/10/2021 1432   GLUCOSEU NEGATIVE 03/10/2021 1432   HGBUR NEGATIVE 03/10/2021 1432   BILIRUBINUR NEGATIVE 03/10/2021 1432   KETONESUR NEGATIVE 03/10/2021 1432   PROTEINUR NEGATIVE 03/10/2021 1432   UROBILINOGEN 0.2 05/20/2009 1405   NITRITE NEGATIVE 03/10/2021 1432   LEUKOCYTESUR MODERATE (A) 03/10/2021 1432   Sepsis Labs: '@LABRCNTIP'$ (procalcitonin:4,lacticidven:4) ) Recent Results (from the past 240 hour(s))  Resp Panel by RT-PCR (Flu A&B, Covid) Nasopharyngeal Swab     Status: None   Collection Time: 06/15/21 12:37 PM   Specimen: Nasopharyngeal Swab; Nasopharyngeal(NP) swabs in vial transport medium  Result Value Ref Range Status   SARS Coronavirus 2 by RT PCR NEGATIVE NEGATIVE Final    Comment: (NOTE) SARS-CoV-2 target nucleic acids are NOT DETECTED.  The SARS-CoV-2 RNA is generally detectable in upper respiratory specimens during the acute phase of infection. The lowest concentration of SARS-CoV-2 viral copies this assay can detect is 138 copies/mL. A negative result does not preclude SARS-Cov-2 infection and should not be used as the sole basis for treatment or other patient management decisions. A negative result may occur with  improper specimen collection/handling, submission of specimen other than nasopharyngeal swab, presence of viral mutation(s) within the areas targeted by this assay, and inadequate number of viral copies(<138 copies/mL). A negative result must be combined with clinical observations, patient history, and epidemiological information. The expected result is Negative.  Fact Sheet for Patients:  EntrepreneurPulse.com.au  Fact Sheet for Healthcare Providers:  IncredibleEmployment.be  This test is no t yet approved or cleared by the [place] FDA and  has been authorized for detection and/or diagnosis of SARS-CoV-2 by FDA under an Emergency Use Authorization (EUA). This EUA will remain  in  effect (meaning this test can be  used) for the duration of the COVID-19 declaration under Section 564(b)(1) of the Act, 21 U.S.C.section 360bbb-3(b)(1), unless the authorization is terminated  or revoked sooner.       Influenza A by PCR NEGATIVE NEGATIVE Final   Influenza B by PCR NEGATIVE NEGATIVE Final    Comment: (NOTE) The Xpert Xpress SARS-CoV-2/FLU/RSV plus assay is intended as an aid in the diagnosis of influenza from Nasopharyngeal swab specimens and should not be used as a sole basis for treatment. Nasal washings and aspirates are unacceptable for Xpert Xpress SARS-CoV-2/FLU/RSV testing.  Fact Sheet for Patients: EntrepreneurPulse.com.au  Fact Sheet for Healthcare Providers: IncredibleEmployment.be  This test is not yet approved or cleared by the [place] FDA and has been authorized for detection and/or diagnosis of SARS-CoV-2 by FDA under an Emergency Use Authorization (EUA). This EUA will remain in effect (meaning this test can be used) for the duration of the COVID-19 declaration under Section 564(b)(1) of the Act, 21 U.S.C. section 360bbb-3(b)(1), unless the authorization is terminated or revoked.  Performed at [facility], [address] [place], [place] [address]   MRSA Next Gen by PCR, Nasal     Status: None   Collection Time: 06/16/21  3:14 AM   Specimen: Nasal Mucosa; Nasal Swab  Result Value Ref Range Status   MRSA by PCR Next Gen NOT DETECTED NOT DETECTED Final    Comment: (NOTE) The GeneXpert MRSA Assay (FDA approved for NASAL specimens only), is one component of a comprehensive MRSA colonization surveillance program. It is not intended to diagnose MRSA infection nor to guide or monitor treatment for MRSA infections. Test performance is not FDA approved in patients less than 83 years old. Performed at [facility], [place] [address] [place], [place] [address]      Radiological  Exams on Admission: No results found.   Assessment/Plan Principal Problem:   Acute GI bleeding Active Problems:   Type 2 diabetes mellitus ([place])   Hypertension associated with diabetes ([place])    Acute GI bleeding likely lower GI given the frank rectal bleeding noticed by the patient.  Presently not on anticoagulation or antiplatelet agents.  Last week patient underwent EGD and colonoscopy.  Colonoscopy did show diverticulosis.  No active source of bleeding.  For now we will check serial CBCs and keep patient on clear liquid diet.  Consult [facility]. Acute blood loss anemia follow CBC serially. Acute on chronic kidney disease stage III likely worsened due to GI bleed.  Hold ARB.  Follow metabolic panel closely. Hypertension we will keep patient n.p.o. and IV hydralazine and continue amlodipine and metoprolol.  Holding ARB due to worsening renal function. Diabetes mellitus type 2 we will keep patient on sliding scale coverage. History of sleep apnea on CPAP. Previous history of DVT presently off Xarelto since last admission last week due to GI bleed.   DVT prophylaxis: SCDs. Code Status: Full code. Family Communication: Patient husband. Disposition Plan: Home. Consults called: We will consult GI. Admission status: Observation.   Rise Patience MD [facility] Pager [phone].  If 7PM-7AM, please contact night-coverage [email or web address] Password [email or web address]  06/21/2021, 2:05 AM

## 2021-06-21 NOTE — Progress Notes (Signed)
Physical Therapy Treatment Patient Details Name: Stephanie Barber MRN: [id number] DOB: 24-Apr-1941 Today's Date: 06/21/2021    History of Present Illness 80 y.o. female admitted 06/20/21 for acute GI bleeding.  PMHx of hypertension, diabetes mellitus type 2, chronic disease, sleep apnea and DVT of the lower extremity was recently admitted for GI bleed and underwent EGD and colonoscopy was discharged 4 days ago after receiving blood transfusions presents to the ER after patient experiencing 3 episodes of painless rectal bleed at home.  Patient has not taken Xarelto since last admission last week.    PT Comments    Pt educated on BPPV and positional testing.  Pt agreeable for Can[place]lith repositioning technique.  Started with Right side today as pt reports most symptomatic.  Pt tolerated well and was able to ambulate in hallway min/guard assist.  Will return to recheck both sides and treat left side tomorrow if still indicated.  Pt aware of plan and agreeable.    Follow Up Recommendations  Outpatient PT (vestibular OPPT if symptoms persist)     Equipment Recommendations  None recommended by PT    Recommendations for Other Services       Precautions / Restrictions Precautions Precautions: Fall    Mobility  Bed Mobility Overal bed mobility: Modified Independent                  Transfers Overall transfer level: Needs assistance Equipment used: None Transfers: Sit to/from Stand Sit to Stand: Supervision         General transfer comment: supervision for safety  Ambulation/Gait Ambulation/Gait assistance: Min guard Gait Distance (Feet): 100 Feet Assistive device: None Gait Pattern/deviations: Step-through pattern;Decreased stride length     General Gait Details: no unsteadiness or dizziness, stiff cautious gait, able to turn body as a unit and limit eye/head movement   Stairs             Wheelchair Mobility    Modified Rankin (Stroke Patients Only)        Balance                                            Cognition Arousal/Alertness: Awake/alert Behavior During Therapy: WFL for tasks assessed/performed Overall Cognitive Status: Within Functional Limits for tasks assessed                                        Exercises      General Comments        Pertinent Vitals/Pain Pain Assessment: No/denies pain    Home Living Family/patient expects to be discharged to:: Private residence Living Arrangements: Spouse/significant other Available Help at Discharge: Family Type of Home: House     Home Layout: One level Home Equipment: None      Prior Function Level of Independence: Independent          PT Goals (current goals can now be found in the care plan section) Acute Rehab PT Goals Patient Stated Goal: stop feeling dizzy PT Goal Formulation: With patient Time For Goal Achievement: 07/05/21 Potential to Achieve Goals: Good Progress towards PT goals: Progressing toward goals    Frequency    Min 4X/week      PT Plan Current plan remains appropriate    Co-evaluation  AM-PAC PT "6 Clicks" Mobility   Outcome Measure  Help needed turning from your back to your side while in a flat bed without using bedrails?: None Help needed moving from lying on your back to sitting on the side of a flat bed without using bedrails?: None Help needed moving to and from a bed to a chair (including a wheelchair)?: A Little Help needed standing up from a chair using your arms (e.g., wheelchair or bedside chair)?: A Little Help needed to walk in hospital room?: A Little Help needed climbing 3-5 steps with a railing? : A Little 6 Click Score: 20    End of Session Equipment Utilized During Treatment: Gait belt Activity Tolerance: Patient tolerated treatment well Patient left: in bed;with call bell/phone within reach;with bed alarm set;with family/visitor present Nurse Communication:  Mobility status PT Visit Diagnosis: BPPV BPPV - Right/Left : Right;Left     Time: [id number] PT Time Calculation (min) (ACUTE ONLY): 18 min  Charges:  $Canalith Rep Proc: 8-22 mins                     Jannette Spanner PT, DPT Acute Rehabilitation Services Pager: [phone] Office: [phone]    York Ram E 06/21/2021, 4:33 PM

## 2021-06-21 NOTE — Plan of Care (Signed)
VSS; ongoing

## 2021-06-21 NOTE — Progress Notes (Signed)
[facility] Gastroenterology Progress Note  VERSA Barber 80 y.o. 07/01/1941  CC: Rectal bleeding   HPI : Patient 80 year old patient who was recently admitted to the hospital with rectal bleeding presented again yesterday with rectal bleeding.  Please see recent GI consult note.  Patient with past medical history of DVT was on Xarelto.  She underwent colonoscopy on June 17, 2021 which showed small ileal diverticulum, patent end-to-end rectosigmoid colon anastomosis, diminutive sigmoid colon polyp, diverticulosis and hemorrhoids.  Xarelto was resumed and she was discharged same day.  Her hemoglobin on discharge was 11.8.  Hemoglobin on admission was 10.7.  Globin this morning is 10.2.  Normal INR  Patient seen and examined at bedside.  According the patient she continues to have dizziness which is related to change in position and head movement.  She also started noticing rectal bleeding few days ago.Denies any abdominal pain.  Occasional nausea.  Denies any vomiting   ROS : Afebrile, negative for chest pain   Objective: Vital signs in last 24 hours: Vitals:   06/21/21 0329 06/21/21 0718  BP: (!) 160/77 (!) 151/77  Pulse: 60 63  Resp: 16 15  Temp: 98.3 F (36.8 C) 98.3 F (36.8 C)  SpO2: 100% 99%    Physical Exam:  General:  Alert, cooperative, no distress, appears stated age  Head:  Normocephalic, without obvious abnormality, atraumatic  Eyes:  , EOM's intact,   Lungs:   No visible respiratory  Heart:  Regular rate and rhythm, S1, S2 normal  Abdomen:   Soft, non-tender, non distended, bowel sounds +   Extremities: Extremities normal, atraumatic, no  edema  Pulses: 2+ and symmetric    Lab Results: Recent Labs    06/20/21 2039  NA 138  K 4.9  CL 105  CO2 25  GLUCOSE 101*  BUN 34*  CREATININE 1.56*  CALCIUM 9.2   Recent Labs    06/20/21 2039  AST 22  ALT 14  ALKPHOS 51  BILITOT 0.5  PROT 6.9  ALBUMIN 3.8   Recent Labs    06/20/21 2039 06/21/21 0356  06/21/21 0545  WBC 6.8 5.5 5.3  NEUTROABS 4.3  --   --   HGB 10.7* 10.3* 10.2*  HCT 33.1* 32.3* 31.2*  MCV 91.7 92.8 92.0  PLT 137* 128* 124*   Recent Labs    06/20/21 2039  LABPROT 12.6  INR 0.9      Assessment/Plan: -Rectal bleeding.  Colonoscopy few days ago showed no evidence of active bleeding, diverticulosis, evidence of previous surgery and hemorrhoids. -Dizziness.  Positional.  Most likely related to vertigo   Recommendation ------------------------ -Start Anusol suppositories -Start meclizine 25 mg daily for possible vertigo -Monitor H&H -Advance diet to full liquid -Repeat CBC in the morning -GI will follow   Otis Brace MD, FACP 06/21/2021, 8:31 AM  Contact #  [phone]

## 2021-06-22 DIAGNOSIS — Z7901 Long term (current) use of anticoagulants: Secondary | ICD-10-CM | POA: Diagnosis not present

## 2021-06-22 DIAGNOSIS — K625 Hemorrhage of anus and rectum: Secondary | ICD-10-CM | POA: Diagnosis not present

## 2021-06-22 DIAGNOSIS — K922 Gastrointestinal hemorrhage, unspecified: Secondary | ICD-10-CM | POA: Diagnosis not present

## 2021-06-22 LAB — CBC
HCT: 31 % — ABNORMAL LOW (ref 36.0–46.0)
Hemoglobin: 10 g/dL — ABNORMAL LOW (ref 12.0–15.0)
MCH: 29.6 pg (ref 26.0–34.0)
MCHC: 32.3 g/dL (ref 30.0–36.0)
MCV: 91.7 fL (ref 80.0–100.0)
Platelets: 160 10*3/uL (ref 150–400)
RBC: 3.38 MIL/uL — ABNORMAL LOW (ref 3.87–5.11)
RDW: 15 % (ref 11.5–15.5)
WBC: 7.5 10*3/uL (ref 4.0–10.5)
nRBC: 0 % (ref 0.0–0.2)

## 2021-06-22 LAB — GLUCOSE, CAPILLARY
Glucose-Capillary: 96 mg/dL (ref 70–99)
Glucose-Capillary: 96 mg/dL (ref 70–99)
Glucose-Capillary: 161 mg/dL — ABNORMAL HIGH (ref 70–99)
Glucose-Capillary: 117 mg/dL — ABNORMAL HIGH (ref 70–99)

## 2021-06-22 LAB — HEMOGLOBIN AND HEMATOCRIT, BLOOD
HCT: 29.9 % — ABNORMAL LOW (ref 36.0–46.0)
Hemoglobin: 9.7 g/dL — ABNORMAL LOW (ref 12.0–15.0)

## 2021-06-22 MED ORDER — METHOCARBAMOL 500 MG PO TABS
500.0000 mg | ORAL_TABLET | Freq: Three times a day (TID) | ORAL | Status: DC | PRN
  Administered 2021-06-22: 500 mg via ORAL
  Filled 2021-06-22: qty 1

## 2021-06-22 NOTE — Progress Notes (Signed)
Physical Therapy Treatment Patient Details Name: Stephanie Barber MRN: [id number] DOB: 1941/08/03 Today's Date: 06/22/2021    History of Present Illness 80 y.o. female admitted 06/20/21 for acute GI bleeding.  PMHx of hypertension, diabetes mellitus type 2, chronic disease, sleep apnea and DVT of the lower extremity was recently admitted for GI bleed and underwent EGD and colonoscopy was discharged 4 days ago after receiving blood transfusions presents to the ER after patient experiencing 3 episodes of painless rectal bleed at home.  Patient has not taken Xarelto since last admission last week.    PT Comments    Pt retested with [name] and found to be negative on Right side today however Positive for rotary nystagmus on left again.  Pt treated with canalith repositioning technique.  Pt able to ambulate in hallway without symptoms end of session.  Will attempt (as schedule permits) to recheck [name] again prior to pt d/c.    06/22/21 0001  Positional Testing  Dix-Hallpike Dix-Hallpike Right;Dix-Hallpike Left  Dix-Hallpike Right  Dix-Hallpike Right Symptoms No nystagmus  Dix-Hallpike Left  Dix-Hallpike Left Duration 15 sec  Dix-Hallpike Left Symptoms Upbeat, right rotatory nystagmus      Follow Up Recommendations  Outpatient PT (vsetibular OPPT if symptoms persist)     Equipment Recommendations  None recommended by PT    Recommendations for Other Services       Precautions / Restrictions Precautions Precautions: Fall    Mobility  Bed Mobility Overal bed mobility: Modified Independent                  Transfers Overall transfer level: Needs assistance Equipment used: None Transfers: Sit to/from Stand Sit to Stand: Supervision         General transfer comment: supervision for safety  Ambulation/Gait Ambulation/Gait assistance: Min guard Gait Distance (Feet): 200 Feet Assistive device: None Gait Pattern/deviations: Step-through pattern;Decreased  stride length     General Gait Details: no unsteadiness or dizziness, stiff cautious gait, able to turn body as a unit and limit eye/head movement   Stairs             Wheelchair Mobility    Modified Rankin (Stroke Patients Only)       Balance                                            Cognition Arousal/Alertness: Awake/alert Behavior During Therapy: WFL for tasks assessed/performed Overall Cognitive Status: Within Functional Limits for tasks assessed                                        Exercises      General Comments        Pertinent Vitals/Pain Pain Assessment: No/denies pain    Home Living Family/patient expects to be discharged to:: Private residence Living Arrangements: Spouse/significant other                  Prior Function            PT Goals (current goals can now be found in the care plan section) Progress towards PT goals: Progressing toward goals    Frequency    Min 4X/week      PT Plan Current plan remains appropriate    Co-evaluation  AM-PAC PT "6 Clicks" Mobility   Outcome Measure  Help needed turning from your back to your side while in a flat bed without using bedrails?: None Help needed moving from lying on your back to sitting on the side of a flat bed without using bedrails?: None Help needed moving to and from a bed to a chair (including a wheelchair)?: A Little Help needed standing up from a chair using your arms (e.g., wheelchair or bedside chair)?: A Little Help needed to walk in hospital room?: A Little Help needed climbing 3-5 steps with a railing? : A Little 6 Click Score: 20    End of Session Equipment Utilized During Treatment: Gait belt Activity Tolerance: Patient tolerated treatment well Patient left: with call bell/phone within reach;with family/visitor present;in chair;with chair alarm set Nurse Communication: Mobility status PT Visit  Diagnosis: BPPV BPPV - Right/Left : Left     Time: [id number] PT Time Calculation (min) (ACUTE ONLY): 20 min  Charges:  $Canalith Rep Proc: 8-22 mins                    Jannette Spanner PT, DPT Acute Rehabilitation Services Pager: [phone] Office: [phone]    York Ram E 06/22/2021, 12:54 PM

## 2021-06-22 NOTE — Progress Notes (Signed)
[facility] Gastroenterology Progress Note  Stephanie Barber 80 y.o. 08-20-1941  CC: Rectal bleeding   Subjective: Patient seen and examined at bedside.  She continues to have rectal bleeding with each bowel movements.  Had 2 episodes since last night.  ROS : Afebrile, negative for chest pain   Objective: Vital signs in last 24 hours: Vitals:   06/22/21 0548 06/22/21 1009  BP: 140/60 129/74  Pulse: 65 65  Resp: 17 18  Temp: 98.1 F (36.7 C) 98.3 F (36.8 C)  SpO2: 100% 100%    Physical Exam:  General:  Alert, cooperative, no distress, appears stated age  Head:  Normocephalic, without obvious abnormality, atraumatic  Eyes:  , EOM's intact,   Lungs:   No visible respiratory distress  Heart:  Regular rate and rhythm, S1, S2 normal  Abdomen:   Soft, non-tender, nondistended, bowel sounds present, no peritoneal signs  Extremities: Extremities normal, atraumatic, no  edema  Pulses: 2+ and symmetric    Lab Results: Recent Labs    06/20/21 2039  NA 138  K 4.9  CL 105  CO2 25  GLUCOSE 101*  BUN 34*  CREATININE 1.56*  CALCIUM 9.2   Recent Labs    06/20/21 2039  AST 22  ALT 14  ALKPHOS 51  BILITOT 0.5  PROT 6.9  ALBUMIN 3.8   Recent Labs    06/20/21 2039 06/21/21 0356 06/21/21 0939 06/22/21 0411  WBC 6.8   < > 5.7 7.5  NEUTROABS 4.3  --   --   --   HGB 10.7*   < > 11.0* 10.0*  HCT 33.1*   < > 33.6* 31.0*  MCV 91.7   < > 91.1 91.7  PLT 137*   < > 138* 160   < > = values in this interval not displayed.   Recent Labs    06/20/21 2039  LABPROT 12.6  INR 0.9      Assessment/Plan: -Rectal bleeding. CT Angio negative for active bleeding on June 15, 2021.  Colonoscopy 08/05 howed no evidence of active bleeding, diverticulosis, evidence of previous surgery and hemorrhoids. -Dizziness.  Positional.  Most likely related to vertigo  -History of DVT.  Last dose of Xarelto prior to admission according to patient    Recommendation ------------------------ -Continues to have rectal bleeding.  Hemoglobin stable. -Continue Anusol suppositories -Continue supportive care, if continues to have bleeding, recommend bleeding scan. -Repeat labs in the morning -Okay to advance diet to soft -GI will follow  Otis Brace MD, FACP 06/22/2021, 10:29 AM  Contact #  [phone]

## 2021-06-22 NOTE — Progress Notes (Signed)
Physical Therapy Treatment Patient Details Name: Stephanie Barber MRN: [id number] DOB: 1941-02-04 Today's Date: 06/22/2021    History of Present Illness 80 y.o. female admitted 06/20/21 for acute GI bleeding.  PMHx of hypertension, diabetes mellitus type 2, chronic disease, sleep apnea and DVT of the lower extremity was recently admitted for GI bleed and underwent EGD and colonoscopy was discharged 4 days ago after receiving blood transfusions presents to the ER after patient experiencing 3 episodes of painless rectal bleed at home.  Patient has not taken Xarelto since last admission last week.    PT Comments    Pt in bed on arrival and reports LE muscle cramps so eager to ambulate.  First rechecked Left [name] and pt without symptoms and no nystagmus observed.  Pt ambulated good distance in hallway and then returned to bed.  Pt denies dizziness or spinning this session.   Recommend f/u OPPT vestibular only if symptoms return/persist.    Follow Up Recommendations  Outpatient PT (vestibular OPPT only if symptoms persist)     Equipment Recommendations  None recommended by PT    Recommendations for Other Services       Precautions / Restrictions Precautions Precautions: Fall    Mobility  Bed Mobility Overal bed mobility: Modified Independent                  Transfers Overall transfer level: Needs assistance Equipment used: None Transfers: Sit to/from Stand Sit to Stand: Supervision         General transfer comment: supervision for safety  Ambulation/Gait Ambulation/Gait assistance: Min guard;Supervision Gait Distance (Feet): 200 Feet Assistive device: None Gait Pattern/deviations: Step-through pattern;Decreased stride length     General Gait Details: no unsteadiness or dizziness, able to turn body as a unit and limit eye/head movement   Stairs             Wheelchair Mobility    Modified Rankin (Stroke Patients Only)       Balance                                             Cognition Arousal/Alertness: Awake/alert Behavior During Therapy: WFL for tasks assessed/performed Overall Cognitive Status: Within Functional Limits for tasks assessed                                        Exercises      General Comments        Pertinent Vitals/Pain Pain Assessment: No/denies pain    Home Living                      Prior Function            PT Goals (current goals can now be found in the care plan section) Progress towards PT goals: Progressing toward goals    Frequency    Min 3X/week      PT Plan Current plan remains appropriate    Co-evaluation              AM-PAC PT "6 Clicks" Mobility   Outcome Measure  Help needed turning from your back to your side while in a flat bed without using bedrails?: None Help needed moving from lying on your back to sitting on the  side of a flat bed without using bedrails?: None Help needed moving to and from a bed to a chair (including a wheelchair)?: A Little Help needed standing up from a chair using your arms (e.g., wheelchair or bedside chair)?: A Little Help needed to walk in hospital room?: A Little Help needed climbing 3-5 steps with a railing? : A Little 6 Click Score: 20    End of Session Equipment Utilized During Treatment: Gait belt Activity Tolerance: Patient tolerated treatment well Patient left: with call bell/phone within reach;in bed;with family/visitor present Nurse Communication: Mobility status PT Visit Diagnosis: BPPV BPPV - Right/Left : Left     Time: [id number] PT Time Calculation (min) (ACUTE ONLY): 13 min  Charges:  $Physical Performance Test: 8-22 mins                    Arlyce Dice, DPT Acute Rehabilitation Services Pager: [phone] Office: [place] 06/22/2021, 3:24 PM

## 2021-06-22 NOTE — Progress Notes (Signed)
Progress Note    Stephanie Barber  [id number] DOB: [birth year]  DOA: 06/20/2021 PCP: [name], MD    Brief Narrative:     Medical records reviewed and are as summarized below:  Stephanie Barber is an 80 y.o. female with history of hypertension, diabetes mellitus type 2, chronic disease, sleep apnea and DVT of the lower extremity was recently admitted for GI bleed and underwent EGD and colonoscopy was discharged 4 days ago after receiving blood transfusions presents to the ER after patient experiencing 3 episodes of painless rectal bleed at home.  Patient has not taken Xarelto since last admission last week.    Assessment/Plan:   Principal Problem:   Acute GI bleeding Active Problems:   Type 2 diabetes mellitus ([place])   Hypertension associated with diabetes ([place])   Protein-calorie malnutrition, severe    Acute GI bleeding likely lower GI given the frank rectal bleeding  -not on anticoagulation or antiplatelet agents.   -Last week patient underwent EGD and colonoscopy.  Colonoscopy did show diverticulosis.   -GI consult  BPPV? 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  Holding ARB due to worsening renal function.  Diabetes mellitus type 2  - sliding scale coverage.  History of sleep apnea - on CPAP.  Previous history of DVT  -off Xarelto since last admission last week due to GI bleed.   Family Communication/Anticipated D/C date and plan/Code Status   DVT prophylaxis: scd Code Status: Full Code.  Disposition Plan: Status is: Observation  The patient will require care spanning > 2 midnights and should be moved to inpatient because: Inpatient level of care appropriate due to severity of illness  Dispo: The patient is from: Home              Anticipated d/c is to: Home              Patient  currently is not medically stable to d/c.   Difficult to place patient No         Medical Consultants:   GI  Subjective:   Had at least 4 more episodes of bloody bowel movements since yesterday  Objective:    Vitals:   06/21/21 0718 06/21/21 1307 06/21/21 2020 06/22/21 0548  BP: (!) 151/77 131/67 (!) 152/71 140/60  Pulse: 63 61 64 65  Resp: '15 20 18 17  '$ Temp: 98.3 F (36.8 C) 98.5 F (36.9 C) 98.5 F (36.9 C) 98.1 F (36.7 C)  TempSrc: Oral Oral Oral   SpO2: 99% 100% 100% 100%  Weight:      Height:        Intake/Output Summary (Last 24 hours) at 06/22/2021 [id number] Last data filed at 06/22/2021 [id number] Gross per 24 hour  Intake 240 ml  Output 700 ml  Net -460 ml   Filed Weights   06/20/21 2014 06/21/21 0329  Weight: 47.6 kg 47.6 kg    Exam:  General: Appearance:    Well developed, well nourished female in no acute distress     Lungs:     Clear to auscultation bilaterally, respirations unlabored  Heart:    Normal heart rate. Normal rhythm. No murmurs, rubs, or gallops.    MS:   All extremities are intact.    Neurologic:   Awake, alert, oriented x 3. No apparent focal neurological  defect.      Data Reviewed:   I have personally reviewed following labs and imaging studies:  Labs: Labs show the following:   Basic Metabolic Panel: Recent Labs  Lab 06/15/21 1237 06/16/21 0611 06/20/21 2039  NA 139 140 138  K 4.5 4.5 4.9  CL 106 109 105  CO2 24 19* 25  GLUCOSE 87 76 101*  BUN 23 22 34*  CREATININE 1.22* 1.20* 1.56*  CALCIUM 9.1 9.3 9.2   GFR Estimated Creatinine Clearance: 19.3 mL/min (A) (by C-G formula based on SCr of 1.56 mg/dL (H)). Liver Function Tests: Recent Labs  Lab 06/15/21 1237 06/20/21 2039  AST 21 22  ALT 13 14  ALKPHOS 49 51  BILITOT 0.4 0.5  PROT 7.5 6.9  ALBUMIN 4.0 3.8   No results for input(s): LIPASE, AMYLASE in the last 168 hours. No results for input(s): AMMONIA in the last 168 hours. Coagulation  profile Recent Labs  Lab 06/15/21 1237 06/16/21 1620 06/20/21 2039  INR 2.3* 1.2 0.9    CBC: Recent Labs  Lab 06/15/21 1237 06/15/21 1506 06/20/21 2039 06/21/21 0356 06/21/21 0545 06/21/21 0939 06/22/21 0411  WBC 4.7   < > 6.8 5.5 5.3 5.7 7.5  NEUTROABS 2.9  --  4.3  --   --   --   --   HGB 8.3*   < > 10.7* 10.3* 10.2* 11.0* 10.0*  HCT 26.5*   < > 33.1* 32.3* 31.2* 33.6* 31.0*  MCV 84.7   < > 91.7 92.8 92.0 91.1 91.7  PLT 231   < > 137* 128* 124* 138* 160   < > = values in this interval not displayed.   Cardiac Enzymes: No results for input(s): CKTOTAL, CKMB, CKMBINDEX, TROPONINI in the last 168 hours. BNP (last 3 results) No results for input(s): PROBNP in the last 8760 hours. CBG: Recent Labs  Lab 06/21/21 0740 06/21/21 1145 06/21/21 1652 06/21/21 2021 06/22/21 0725  GLUCAP 81 119* 111* 114* 96   D-Dimer: No results for input(s): DDIMER in the last 72 hours. Hgb A1c: No results for input(s): HGBA1C in the last 72 hours. Lipid Profile: No results for input(s): CHOL, HDL, LDLCALC, TRIG, CHOLHDL, LDLDIRECT in the last 72 hours. Thyroid function studies: No results for input(s): TSH, T4TOTAL, T3FREE, THYROIDAB in the last 72 hours.  Invalid input(s): FREET3 Anemia work up: No results for input(s): VITAMINB12, FOLATE, FERRITIN, TIBC, IRON, RETICCTPCT in the last 72 hours. Sepsis Labs: Recent Labs  Lab 06/21/21 0356 06/21/21 0545 06/21/21 0939 06/22/21 0411  WBC 5.5 5.3 5.7 7.5    Microbiology Recent Results (from the past 240 hour(s))  Resp Panel by RT-PCR (Flu A&B, Covid) Nasopharyngeal Swab     Status: None   Collection Time: 06/15/21 12:37 PM   Specimen: Nasopharyngeal Swab; Nasopharyngeal(NP) swabs in vial transport medium  Result Value Ref Range Status   SARS Coronavirus 2 by RT PCR NEGATIVE NEGATIVE Final    Comment: (NOTE) SARS-CoV-2 target nucleic acids are NOT DETECTED.  The SARS-CoV-2 RNA is generally detectable in upper  respiratory specimens during the acute phase of infection. The lowest concentration of SARS-CoV-2 viral copies this assay can detect is 138 copies/mL. A negative result does not preclude SARS-Cov-2 infection and should not be used as the sole basis for treatment or other patient management decisions. A negative result may occur with  improper specimen collection/handling, submission of specimen other than nasopharyngeal swab, presence of viral mutation(s) within the areas targeted by this assay, and inadequate  number of viral copies(<138 copies/mL). A negative result must be combined with clinical observations, patient history, and epidemiological information. The expected result is Negative.  Fact Sheet for Patients:  EntrepreneurPulse.com.au  Fact Sheet for Healthcare Providers:  IncredibleEmployment.be  This test is no t yet approved or cleared by the [place] FDA and  has been authorized for detection and/or diagnosis of SARS-CoV-2 by FDA under an Emergency Use Authorization (EUA). This EUA will remain  in effect (meaning this test can be used) for the duration of the COVID-19 declaration under Section 564(b)(1) of the Act, 21 U.S.C.section 360bbb-3(b)(1), unless the authorization is terminated  or revoked sooner.       Influenza A by PCR NEGATIVE NEGATIVE Final   Influenza B by PCR NEGATIVE NEGATIVE Final    Comment: (NOTE) The Xpert Xpress SARS-CoV-2/FLU/RSV plus assay is intended as an aid in the diagnosis of influenza from Nasopharyngeal swab specimens and should not be used as a sole basis for treatment. Nasal washings and aspirates are unacceptable for Xpert Xpress SARS-CoV-2/FLU/RSV testing.  Fact Sheet for Patients: EntrepreneurPulse.com.au  Fact Sheet for Healthcare Providers: IncredibleEmployment.be  This test is not yet approved or cleared by the [place] FDA and has been  authorized for detection and/or diagnosis of SARS-CoV-2 by FDA under an Emergency Use Authorization (EUA). This EUA will remain in effect (meaning this test can be used) for the duration of the COVID-19 declaration under Section 564(b)(1) of the Act, 21 U.S.C. section 360bbb-3(b)(1), unless the authorization is terminated or revoked.  Performed at [facility], [address] [place], [place] [address]   MRSA Next Gen by PCR, Nasal     Status: None   Collection Time: 06/16/21  3:14 AM   Specimen: Nasal Mucosa; Nasal Swab  Result Value Ref Range Status   MRSA by PCR Next Gen NOT DETECTED NOT DETECTED Final    Comment: (NOTE) The GeneXpert MRSA Assay (FDA approved for NASAL specimens only), is one component of a comprehensive MRSA colonization surveillance program. It is not intended to diagnose MRSA infection nor to guide or monitor treatment for MRSA infections. Test performance is not FDA approved in patients less than 75 years old. Performed at [facility], [place] [address] [place], [place] [address]   Resp Panel by RT-PCR (Flu A&B, Covid) Nasopharyngeal Swab     Status: None   Collection Time: 06/21/21  1:51 AM   Specimen: Nasopharyngeal Swab; Nasopharyngeal(NP) swabs in vial transport medium  Result Value Ref Range Status   SARS Coronavirus 2 by RT PCR NEGATIVE NEGATIVE Final    Comment: (NOTE) SARS-CoV-2 target nucleic acids are NOT DETECTED.  The SARS-CoV-2 RNA is generally detectable in upper respiratory specimens during the acute phase of infection. The lowest concentration of SARS-CoV-2 viral copies this assay can detect is 138 copies/mL. A negative result does not preclude SARS-Cov-2 infection and should not be used as the sole basis for treatment or other patient management decisions. A negative result may occur with  improper specimen collection/handling, submission of specimen other than nasopharyngeal swab, presence of viral  mutation(s) within the areas targeted by this assay, and inadequate number of viral copies(<138 copies/mL). A negative result must be combined with clinical observations, patient history, and epidemiological information. The expected result is Negative.  Fact Sheet for Patients:  EntrepreneurPulse.com.au  Fact Sheet for Healthcare Providers:  IncredibleEmployment.be  This test is no t yet approved or cleared by the [place] FDA and  has  been authorized for detection and/or diagnosis of SARS-CoV-2 by FDA under an Emergency Use Authorization (EUA). This EUA will remain  in effect (meaning this test can be used) for the duration of the COVID-19 declaration under Section 564(b)(1) of the Act, 21 U.S.C.section 360bbb-3(b)(1), unless the authorization is terminated  or revoked sooner.       Influenza A by PCR NEGATIVE NEGATIVE Final   Influenza B by PCR NEGATIVE NEGATIVE Final    Comment: (NOTE) The Xpert Xpress SARS-CoV-2/FLU/RSV plus assay is intended as an aid in the diagnosis of influenza from Nasopharyngeal swab specimens and should not be used as a sole basis for treatment. Nasal washings and aspirates are unacceptable for Xpert Xpress SARS-CoV-2/FLU/RSV testing.  Fact Sheet for Patients: EntrepreneurPulse.com.au  Fact Sheet for Healthcare Providers: IncredibleEmployment.be  This test is not yet approved or cleared by the [place] FDA and has been authorized for detection and/or diagnosis of SARS-CoV-2 by FDA under an Emergency Use Authorization (EUA). This EUA will remain in effect (meaning this test can be used) for the duration of the COVID-19 declaration under Section 564(b)(1) of the Act, 21 U.S.C. section 360bbb-3(b)(1), unless the authorization is terminated or revoked.  Performed at [facility], [place] [address] [place], [place] [address]     Procedures and  diagnostic studies:  No results found.  Medications:    amLODipine  10 mg Oral Daily   diazepam  1 mg Oral QHS   feeding supplement  237 mL Oral TID BM   gabapentin  400 mg Oral QHS   hydrocortisone  25 mg Rectal QHS   insulin aspart  0-9 Units Subcutaneous TID WC   meclizine  25 mg Oral Daily   metoprolol succinate  50 mg Oral QPC breakfast   multivitamin with minerals  1 tablet Oral Daily   omega-3 acid ethyl esters  1 g Oral Daily   pravastatin  40 mg Oral QHS   sertraline  25 mg Oral QHS   Continuous Infusions:   LOS: 0 days   [name]  [facility]   How to contact the [facility] Attending or Consulting provider [place] or covering provider during after hours [place], for this patient?  Check the care team in [facility] and look for a) attending/consulting TR[facility] provider listed and b) the [facility] team listed Log into [email or web address] and use [place]'s universal password to access. If you do not have the password, please contact the hospital operator. Locate the [facility] provider you are looking for under [facility] and page to a number that you can be directly reached. If you still have difficulty reaching the provider, please page the [facility] (Director on Call) for the Hospitalists listed on amion for assistance.  06/22/2021, 9:07 AM

## 2021-06-22 NOTE — Progress Notes (Signed)
The patient complains of bilateral lower extremity spasms. Provider contacted, see orders.

## 2021-06-23 ENCOUNTER — Observation Stay (HOSPITAL_BASED_OUTPATIENT_CLINIC_OR_DEPARTMENT_OTHER): Payer: Medicare Other

## 2021-06-23 ENCOUNTER — Inpatient Hospital Stay: Payer: Medicare Other

## 2021-06-23 ENCOUNTER — Inpatient Hospital Stay: Payer: Medicare Other | Admitting: Hematology and Oncology

## 2021-06-23 DIAGNOSIS — M79669 Pain in unspecified lower leg: Secondary | ICD-10-CM | POA: Diagnosis not present

## 2021-06-23 DIAGNOSIS — Z7901 Long term (current) use of anticoagulants: Secondary | ICD-10-CM | POA: Diagnosis not present

## 2021-06-23 DIAGNOSIS — K922 Gastrointestinal hemorrhage, unspecified: Secondary | ICD-10-CM | POA: Diagnosis not present

## 2021-06-23 DIAGNOSIS — K625 Hemorrhage of anus and rectum: Secondary | ICD-10-CM | POA: Diagnosis not present

## 2021-06-23 LAB — BASIC METABOLIC PANEL
Anion gap: 8 (ref 5–15)
BUN: 35 mg/dL — ABNORMAL HIGH (ref 8–23)
CO2: 24 mmol/L (ref 22–32)
Calcium: 9.4 mg/dL (ref 8.9–10.3)
Chloride: 108 mmol/L (ref 98–111)
Creatinine, Ser: 1.43 mg/dL — ABNORMAL HIGH (ref 0.44–1.00)
GFR, Estimated: 37 mL/min — ABNORMAL LOW (ref >60)
Glucose, Bld: 101 mg/dL — ABNORMAL HIGH (ref 70–99)
Potassium: 4.4 mmol/L (ref 3.5–5.1)
Sodium: 140 mmol/L (ref 135–145)

## 2021-06-23 LAB — CBC
HCT: 28.8 % — ABNORMAL LOW (ref 36.0–46.0)
Hemoglobin: 9.3 g/dL — ABNORMAL LOW (ref 12.0–15.0)
MCH: 29.6 pg (ref 26.0–34.0)
MCHC: 32.3 g/dL (ref 30.0–36.0)
MCV: 91.7 fL (ref 80.0–100.0)
Platelets: 175 10*3/uL (ref 150–400)
RBC: 3.14 MIL/uL — ABNORMAL LOW (ref 3.87–5.11)
RDW: 14.9 % (ref 11.5–15.5)
WBC: 6.9 10*3/uL (ref 4.0–10.5)
nRBC: 0 % (ref 0.0–0.2)

## 2021-06-23 LAB — GLUCOSE, CAPILLARY
Glucose-Capillary: 80 mg/dL (ref 70–99)
Glucose-Capillary: 105 mg/dL — ABNORMAL HIGH (ref 70–99)

## 2021-06-23 MED ORDER — IRON 325 (65 FE) MG PO TABS: 325.0000 mg | ORAL_TABLET | Freq: Every day | ORAL | 0 refills | Status: DC

## 2021-06-23 MED ORDER — HYDROCORTISONE ACETATE 25 MG RE SUPP: 25.0000 mg | Freq: Every day | RECTAL | 0 refills | Status: DC

## 2021-06-23 MED ORDER — METHOCARBAMOL 500 MG PO TABS: 500.0000 mg | ORAL_TABLET | Freq: Three times a day (TID) | ORAL | 0 refills | Status: DC | PRN

## 2021-06-23 MED ORDER — MECLIZINE HCL 25 MG PO TABS: 25.0000 mg | ORAL_TABLET | Freq: Three times a day (TID) | ORAL | 0 refills | Status: DC | PRN

## 2021-06-23 NOTE — Care Management Obs Status (Signed)
[facility] NOTIFICATION   Patient Details  Name: KRISTEN FRANGELLA MRN: [id number] Date of Birth: 10/15/1941   Medicare Observation Status Notification Given:  Yes    Purcell Mouton, RN 06/23/2021, 1:42 PM

## 2021-06-23 NOTE — Progress Notes (Signed)
[facility] Gastroenterology Progress Note  Stephanie Barber 80 y.o. 31-Oct-1941  CC: Rectal bleeding   Subjective: Patient seen and examined at bedside.  Denies any further bleeding episodes.  He had a brown-colored stool last night.  No other GI issues.  ROS : Afebrile, negative for chest pain   Objective: Vital signs in last 24 hours: Vitals:   06/22/21 2316 06/23/21 0423  BP: 117/64 120/74  Pulse: 64 66  Resp:  18  Temp: 98.5 F (36.9 C) 97.8 F (36.6 C)  SpO2: 99% 99%    Physical Exam:  General:  Alert, cooperative, no distress, appears stated age  Head:  Normocephalic, without obvious abnormality, atraumatic  Eyes:  , EOM's intact,   Lungs:   No visible respiratory distress  Heart:  Regular rate and rhythm, S1, S2 normal  Abdomen:   Soft, non-tender, nondistended, bowel sounds present, no peritoneal signs  Extremities: Extremities normal, atraumatic, no  edema  Pulses: 2+ and symmetric    Lab Results: Recent Labs    06/20/21 2039 06/23/21 0406  NA 138 140  K 4.9 4.4  CL 105 108  CO2 25 24  GLUCOSE 101* 101*  BUN 34* 35*  CREATININE 1.56* 1.43*  CALCIUM 9.2 9.4   Recent Labs    06/20/21 2039  AST 22  ALT 14  ALKPHOS 51  BILITOT 0.5  PROT 6.9  ALBUMIN 3.8   Recent Labs    06/20/21 2039 06/21/21 0356 06/22/21 0411 06/22/21 1555 06/23/21 0406  WBC 6.8   < > 7.5  --  6.9  NEUTROABS 4.3  --   --   --   --   HGB 10.7*   < > 10.0* 9.7* 9.3*  HCT 33.1*   < > 31.0* 29.9* 28.8*  MCV 91.7   < > 91.7  --  91.7  PLT 137*   < > 160  --  175   < > = values in this interval not displayed.   Recent Labs    06/20/21 2039  LABPROT 12.6  INR 0.9      Assessment/Plan: -Rectal bleeding. CT Angio negative for active bleeding on June 15, 2021.  Colonoscopy 08/05 howed no evidence of active bleeding, diverticulosis, evidence of previous surgery and hemorrhoids. -Dizziness.  Positional.  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  Last  dose of Xarelto prior to admission according to patient   Recommendation ------------------------ -No further bleeding episodes. -Advance diet to heart healthy - Discussed with Dr. Eliseo Squires.  Patient with only 1 episode of DVT in July 2020.  She may not need anticoagulation moving forward. -Management of anticoagulation per hospital team. -If anticoagulation clinically indicated, okay to resume after 5 days -Okay to discharge from GI standpoint.   -Recommend repeat CBC with PCP in 1 week. -Recommend iron supplement -GI will sign off.  Call us back if needed  Otis Brace MD, [place] 06/23/2021, 9:25 AM  Contact #  [phone]

## 2021-06-23 NOTE — Progress Notes (Signed)
Discharge teaching complete. Meds, diet, activity, follow up appointments reviewed and all questions answered. Copy of instructions given to patient and prescriptions sent to pharmacy. Husband at bedside.

## 2021-06-23 NOTE — Discharge Summary (Signed)
Physician Discharge Summary  Stephanie Barber [id number] DOB: [birth year] DOA: 06/20/2021  PCP: [name], MD  Admit date: 06/20/2021 Discharge date: 06/23/2021  Admitted From: home Discharge disposition: home   Recommendations for Outpatient Follow-Up:   Xarelto d/c'd (only could find 1 DVT in past-- repeated duplex was normal) If she further issues with BPPV- refer to outpatient vestibuar PT CBC/BMP 1 week   Discharge Diagnosis:   Principal Problem:   Acute GI bleeding Active Problems:   Type 2 diabetes mellitus ([place])   Hypertension associated with diabetes ([place])   Protein-calorie malnutrition, severe    Discharge Condition: Improved.  Diet recommendation: Low sodium, heart healthy.  Carbohydrate-modified.   Wound care: None.  Code status: Full.   History of Present Illness:   Stephanie Barber is a 80 y.o. female with history of hypertension, diabetes mellitus type 2, chronic disease, sleep apnea and DVT of the lower extremity was recently admitted for GI bleed and underwent EGD and colonoscopy was discharged 4 days ago after receiving blood transfusions presents to the ER after patient experiencing 3 episodes of painless rectal bleed at home.  Patient has not taken Xarelto since last admission last week.  Patient has been having chronic dizziness over the last 2 weeks now.  Denies any chest pain abdominal pain nausea vomiting.   ED Course: In the ER patient's hemoglobin was 1 g drop from the recent past, it was around 10.7 and 4 days ago it was 11.8.  Creatinine has worsened it is around 1.5 at discharge it was 1.2.  COVID test negative.  Patient admitted for further observation for rectal bleeding.   Hospital Course by Problem:   Acute GI bleeding likely lower GI given the frank rectal bleeding  -not on anticoagulation or antiplatelet agents.   -Last week patient underwent EGD and colonoscopy.  Colonoscopy did show diverticulosis.   -normal BM  today -GI consult appreciated: -Recommend repeat CBC with PCP in 1 week. -Recommend iron supplement   BPPV? 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  Holding ARB due to worsening renal function. -resume outpatient as able   Diabetes mellitus type 2  -diet controlled   History of sleep apnea - on CPAP.   Previous history of DVT  -d/c xarelto-- see above    Medical Consultants:      Discharge Exam:   Vitals:   06/23/21 1205 06/23/21 1208  BP: (!) 114/57 (!) 114/57  Pulse: 62 62  Resp: 15 15  Temp: 98.2 F (36.8 C) 98.2 F (36.8 C)  SpO2: 98% 98%   Vitals:   06/22/21 2316 06/23/21 0423 06/23/21 1205 06/23/21 1208  BP: 117/64 120/74 (!) 114/57 (!) 114/57  Pulse: 64 66 62 62  Resp:  '18 15 15  '$ Temp: 98.5 F (36.9 C) 97.8 F (36.6 C) 98.2 F (36.8 C) 98.2 F (36.8 C)  TempSrc: Oral Oral Oral Oral  SpO2: 99% 99% 98% 98%  Weight:      Height:        General exam: Appears calm and comfortable.  The results of significant diagnostics from this hospitalization (including imaging, microbiology, ancillary and laboratory) are listed below for reference.     Procedures and Diagnostic Studies:   No results found.   Labs:   Basic Metabolic Panel: Recent Labs  Lab  06/20/21 2039 06/23/21 0406  NA 138 140  K 4.9 4.4  CL 105 108  CO2 25 24  GLUCOSE 101* 101*  BUN 34* 35*  CREATININE 1.56* 1.43*  CALCIUM 9.2 9.4   GFR Estimated Creatinine Clearance: 21 mL/min (A) (by C-G formula based on SCr of 1.43 mg/dL (H)). Liver Function Tests: Recent Labs  Lab 06/20/21 2039  AST 22  ALT 14  ALKPHOS 51  BILITOT 0.5  PROT 6.9  ALBUMIN 3.8   No results for input(s): LIPASE, AMYLASE in the last 168 hours. No results for input(s): AMMONIA in the last 168 hours. Coagulation profile Recent  Labs  Lab 06/16/21 1620 06/20/21 2039  INR 1.2 0.9    CBC: Recent Labs  Lab 06/20/21 2039 06/21/21 0356 06/21/21 0545 06/21/21 0939 06/22/21 0411 06/22/21 1555 06/23/21 0406  WBC 6.8 5.5 5.3 5.7 7.5  --  6.9  NEUTROABS 4.3  --   --   --   --   --   --   HGB 10.7* 10.3* 10.2* 11.0* 10.0* 9.7* 9.3*  HCT 33.1* 32.3* 31.2* 33.6* 31.0* 29.9* 28.8*  MCV 91.7 92.8 92.0 91.1 91.7  --  91.7  PLT 137* 128* 124* 138* 160  --  175   Cardiac Enzymes: No results for input(s): CKTOTAL, CKMB, CKMBINDEX, TROPONINI in the last 168 hours. BNP: Invalid input(s): POCBNP CBG: Recent Labs  Lab 06/22/21 1147 06/22/21 1744 06/22/21 2315 06/23/21 0743 06/23/21 1201  GLUCAP 96 161* 117* 80 105*   D-Dimer No results for input(s): DDIMER in the last 72 hours. Hgb A1c No results for input(s): HGBA1C in the last 72 hours. Lipid Profile No results for input(s): CHOL, HDL, LDLCALC, TRIG, CHOLHDL, LDLDIRECT in the last 72 hours. Thyroid function studies No results for input(s): TSH, T4TOTAL, T3FREE, THYROIDAB in the last 72 hours.  Invalid input(s): FREET3 Anemia work up No results for input(s): VITAMINB12, FOLATE, FERRITIN, TIBC, IRON, RETICCTPCT in the last 72 hours. Microbiology Recent Results (from the past 240 hour(s))  Resp Panel by RT-PCR (Flu A&B, Covid) Nasopharyngeal Swab     Status: None   Collection Time: 06/15/21 12:37 PM   Specimen: Nasopharyngeal Swab; Nasopharyngeal(NP) swabs in vial transport medium  Result Value Ref Range Status   SARS Coronavirus 2 by RT PCR NEGATIVE NEGATIVE Final    Comment: (NOTE) SARS-CoV-2 target nucleic acids are NOT DETECTED.  The SARS-CoV-2 RNA is generally detectable in upper respiratory specimens during the acute phase of infection. The lowest concentration of SARS-CoV-2 viral copies this assay can detect is 138 copies/mL. A negative result does not preclude SARS-Cov-2 infection and should not be used as the sole basis for treatment  or other patient management decisions. A negative result may occur with  improper specimen collection/handling, submission of specimen other than nasopharyngeal swab, presence of viral mutation(s) within the areas targeted by this assay, and inadequate number of viral copies(<138 copies/mL). A negative result must be combined with clinical observations, patient history, and epidemiological information. The expected result is Negative.  Fact Sheet for Patients:  EntrepreneurPulse.com.au  Fact Sheet for Healthcare Providers:  IncredibleEmployment.be  This test is no t yet approved or cleared by the [place] FDA and  has been authorized for detection and/or diagnosis of SARS-CoV-2 by FDA under an Emergency Use Authorization (EUA). This EUA will remain  in effect (meaning this test can be used) for the duration of the COVID-19 declaration under Section 564(b)(1) of the Act, 21 U.S.C.section 360bbb-3(b)(1), unless the authorization is  terminated  or revoked sooner.       Influenza A by PCR NEGATIVE NEGATIVE Final   Influenza B by PCR NEGATIVE NEGATIVE Final    Comment: (NOTE) The Xpert Xpress SARS-CoV-2/FLU/RSV plus assay is intended as an aid in the diagnosis of influenza from Nasopharyngeal swab specimens and should not be used as a sole basis for treatment. Nasal washings and aspirates are unacceptable for Xpert Xpress SARS-CoV-2/FLU/RSV testing.  Fact Sheet for Patients: EntrepreneurPulse.com.au  Fact Sheet for Healthcare Providers: IncredibleEmployment.be  This test is not yet approved or cleared by the [place] FDA and has been authorized for detection and/or diagnosis of SARS-CoV-2 by FDA under an Emergency Use Authorization (EUA). This EUA will remain in effect (meaning this test can be used) for the duration of the COVID-19 declaration under Section 564(b)(1) of the Act, 21 U.S.C. section  360bbb-3(b)(1), unless the authorization is terminated or revoked.  Performed at [facility], [address] [place], [place] [address]   MRSA Next Gen by PCR, Nasal     Status: None   Collection Time: 06/16/21  3:14 AM   Specimen: Nasal Mucosa; Nasal Swab  Result Value Ref Range Status   MRSA by PCR Next Gen NOT DETECTED NOT DETECTED Final    Comment: (NOTE) The GeneXpert MRSA Assay (FDA approved for NASAL specimens only), is one component of a comprehensive MRSA colonization surveillance program. It is not intended to diagnose MRSA infection nor to guide or monitor treatment for MRSA infections. Test performance is not FDA approved in patients less than 45 years old. Performed at [facility], [place] [address] [place], [place] [address]   Resp Panel by RT-PCR (Flu A&B, Covid) Nasopharyngeal Swab     Status: None   Collection Time: 06/21/21  1:51 AM   Specimen: Nasopharyngeal Swab; Nasopharyngeal(NP) swabs in vial transport medium  Result Value Ref Range Status   SARS Coronavirus 2 by RT PCR NEGATIVE NEGATIVE Final    Comment: (NOTE) SARS-CoV-2 target nucleic acids are NOT DETECTED.  The SARS-CoV-2 RNA is generally detectable in upper respiratory specimens during the acute phase of infection. The lowest concentration of SARS-CoV-2 viral copies this assay can detect is 138 copies/mL. A negative result does not preclude SARS-Cov-2 infection and should not be used as the sole basis for treatment or other patient management decisions. A negative result may occur with  improper specimen collection/handling, submission of specimen other than nasopharyngeal swab, presence of viral mutation(s) within the areas targeted by this assay, and inadequate number of viral copies(<138 copies/mL). A negative result must be combined with clinical observations, patient history, and epidemiological information. The expected result is Negative.  Fact Sheet for  Patients:  EntrepreneurPulse.com.au  Fact Sheet for Healthcare Providers:  IncredibleEmployment.be  This test is no t yet approved or cleared by the [place] FDA and  has been authorized for detection and/or diagnosis of SARS-CoV-2 by FDA under an Emergency Use Authorization (EUA). This EUA will remain  in effect (meaning this test can be used) for the duration of the COVID-19 declaration under Section 564(b)(1) of the Act, 21 U.S.C.section 360bbb-3(b)(1), unless the authorization is terminated  or revoked sooner.       Influenza A by PCR NEGATIVE NEGATIVE Final   Influenza B by PCR NEGATIVE NEGATIVE Final    Comment: (NOTE) The Xpert Xpress SARS-CoV-2/FLU/RSV plus assay is intended as an aid in the diagnosis of influenza from Nasopharyngeal swab specimens and should not be used  as a sole basis for treatment. Nasal washings and aspirates are unacceptable for Xpert Xpress SARS-CoV-2/FLU/RSV testing.  Fact Sheet for Patients: EntrepreneurPulse.com.au  Fact Sheet for Healthcare Providers: IncredibleEmployment.be  This test is not yet approved or cleared by the [place] FDA and has been authorized for detection and/or diagnosis of SARS-CoV-2 by FDA under an Emergency Use Authorization (EUA). This EUA will remain in effect (meaning this test can be used) for the duration of the COVID-19 declaration under Section 564(b)(1) of the Act, 21 U.S.C. section 360bbb-3(b)(1), unless the authorization is terminated or revoked.  Performed at [facility], [place] [address], [place], [place] [address]      Discharge Instructions:   Discharge Instructions     Diet - low sodium heart healthy   Complete by: As directed    Diet Carb Modified   Complete by: As directed    Discharge instructions   Complete by: As directed    Cbc/bmp 1 week Iron can be constipating, be sure to use bowel  regimen to avoid this   Increase activity slowly   Complete by: As directed       Allergies as of 06/23/2021       Reactions   Morphine And Related Anaphylaxis        Medication List     STOP taking these medications    losartan 100 MG tablet Commonly known as: COZAAR   traMADol 50 MG tablet Commonly known as: ULTRAM       TAKE these medications    acetaminophen 500 MG tablet Commonly known as: TYLENOL Take 500 mg by mouth every 6 (six) hours as needed for moderate pain.   amLODipine 10 MG tablet Commonly known as: NORVASC Take 10 mg by mouth daily after breakfast.   celecoxib 200 MG capsule Commonly known as: CELEBREX Take 200 mg by mouth daily.   Cinnamon 500 MG capsule Take 500 mg by mouth daily.   diazepam 2 MG tablet Commonly known as: VALIUM Take 1 mg by mouth at bedtime.   fish oil-omega-3 fatty acids 1000 MG capsule Take 1 g by mouth daily.   gabapentin 400 MG capsule Commonly known as: NEURONTIN Take 400 mg by mouth at bedtime.   hydrocortisone 25 MG suppository Commonly known as: ANUSOL-HC Place 1 suppository (25 mg total) rectally at bedtime.   Iron 325 (65 Fe) MG Tabs Take 1 tablet (325 mg total) by mouth daily.   loperamide 2 MG tablet Commonly known as: IMODIUM A-D Take 2 mg by mouth 4 (four) times daily as needed for diarrhea or loose stools.   meclizine 25 MG tablet Commonly known as: ANTIVERT Take 1 tablet (25 mg total) by mouth 3 (three) times daily as needed for dizziness.   metFORMIN 500 MG tablet Commonly known as: GLUCOPHAGE Take 500 mg by mouth 2 (two) times daily with a meal.   methocarbamol 500 MG tablet Commonly known as: ROBAXIN Take 1 tablet (500 mg total) by mouth every 8 (eight) hours as needed for muscle spasms.   metoprolol succinate 50 MG 24 hr tablet Commonly known as: TOPROL-XL Take 50 mg by mouth daily after breakfast.   Polyethyl Glycol-Propyl Glycol 0.4-0.3 % Soln Place 1 drop into both eyes  daily.   pravastatin 40 MG tablet Commonly known as: PRAVACHOL Take 40 mg by mouth at bedtime.   sertraline 25 MG tablet Commonly known as: ZOLOFT Take 25 mg by mouth at bedtime.  Time coordinating discharge: 35 min  Signed:  [name] DO  [facility] 06/23/2021, 12:21 PM

## 2021-06-23 NOTE — Progress Notes (Signed)
Patient associates her LE cramping with blood clot so is hesitant to come off xarelto.  Will get b/l LE duplexes.

## 2021-06-23 NOTE — Progress Notes (Signed)
Bilateral lower extremity venous duplex has been completed. Preliminary results can be found in CV Proc through chart review.   06/23/21 11:08 AM Stephanie Barber RVT

## 2021-06-24 ENCOUNTER — Telehealth: Payer: Self-pay | Admitting: Physician Assistant

## 2021-06-24 ENCOUNTER — Encounter: Payer: Medicare Other | Admitting: Physician Assistant

## 2021-06-24 ENCOUNTER — Other Ambulatory Visit: Payer: Medicare Other

## 2021-06-24 NOTE — Telephone Encounter (Signed)
Pt cld to reschedule her new hem appt on 8/24 at 11am to see Murray Hodgkins.

## 2021-06-27 DIAGNOSIS — K625 Hemorrhage of anus and rectum: Secondary | ICD-10-CM | POA: Diagnosis not present

## 2021-06-27 DIAGNOSIS — N289 Disorder of kidney and ureter, unspecified: Secondary | ICD-10-CM | POA: Diagnosis not present

## 2021-06-29 DIAGNOSIS — D500 Iron deficiency anemia secondary to blood loss (chronic): Secondary | ICD-10-CM | POA: Diagnosis not present

## 2021-06-29 DIAGNOSIS — Z86718 Personal history of other venous thrombosis and embolism: Secondary | ICD-10-CM | POA: Diagnosis not present

## 2021-06-29 DIAGNOSIS — K921 Melena: Secondary | ICD-10-CM | POA: Diagnosis not present

## 2021-06-29 DIAGNOSIS — R197 Diarrhea, unspecified: Secondary | ICD-10-CM | POA: Diagnosis not present

## 2021-06-29 DIAGNOSIS — K922 Gastrointestinal hemorrhage, unspecified: Secondary | ICD-10-CM | POA: Diagnosis not present

## 2021-06-29 DIAGNOSIS — N183 Chronic kidney disease, stage 3 unspecified: Secondary | ICD-10-CM | POA: Diagnosis not present

## 2021-07-01 DIAGNOSIS — R197 Diarrhea, unspecified: Secondary | ICD-10-CM | POA: Diagnosis not present

## 2021-07-05 NOTE — Progress Notes (Signed)
[place] [phone]) [phone]   Fax:([phone] [place] NOTE  Patient Care Team: [name], MD as PCP - General (Family Medicine) Ronald Lobo, MD as Consulting Physician (Gastroenterology) Clance, Armando Reichert, MD as Consulting Physician (Pulmonary Disease)  Hematological/Oncological History 1) 05/24/2021: Labs from PCP, Dr. [name] showed WBC 5.0, Hgb 8.7 (L), MCV 85.2, Plt 277, Ferritin 6.2 (L), Iron 24 (L), Transferrin 315, Iron saturation 6%, TIBC 441.   2)Admitted from 06/15/2021-06/17/2021 for multiple episodes of rectal bleeding. Hgb was 8.3, Fecal occult blood test was positive. Patient was taking Xarelto for history of DVT, last dose 06/14/2021. CT scan showed some diverticulosis with no active bleeding. Underwent EGD and colonoscopy with no source of bleeding. Felt to be diverticular bleeding exacerbated by use of anticoagulation. Xarelto was stopped and received 3 units of pRBC. Hgb was 11.8.  3) Readmitted from 06/20/2021-06/23/2021 for drop in Hgb from 11.8 to 10.7. Recommended to initiate ferrous sulfate 325 mg once daily and to follow up with PCP.   4) 07/06/2021: Establish care with Dede Query PA-C   CHIEF COMPLAINTS/PURPOSE OF CONSULTATION:  "Iron deficiency anemia"  HISTORY OF PRESENTING ILLNESS:  Gertie Exon 80 y.o. female with medical history significant for DVT, HTN, T2DM. Hyperlipidemia, OSA, migraines, GERD and rectal prolapse s/p repair.   On exam today, Ms. Montesdeoca reports ongoing fatigue that does affect her ability to complete her daily activities. She notes being quite active prior to recent hospitalization. She has a good appetite with 3-4 pound weight loss. She denies any dietary restrictions. Patient denies any nausea, vomiting or abdominal pain. She reports chronic post prandial loose stools for several years. She is currently awaiting results for stool studies for possible GI infection. Her stools are black but she  has started taking ferrous sulfate since most recent hospitalization. Patient denies any easy bruising or signs of bleeding. She denies any fevers, chills, night sweats, shortness of breath, chest pain or cough. She has no other complaints. Rest of the 10 point ROS is below.   MEDICAL HISTORY:  Past Medical History:  Diagnosis Date   Arthritis    Chronic headache    Complication of anesthesia    allergy to Novocaine    Diabetes mellitus    GERD (gastroesophageal reflux disease)    Heart murmur    High cholesterol    Hyperlipidemia    Hypertension    Intracranial atherosclerosis    per MRA   Migraine    Narcolepsy and cataplexy    OSA on CPAP    states has not used CPAP in over a year   Rectal prolapse    Trigger finger of all digits of right hand     SURGICAL HISTORY: Past Surgical History:  Procedure Laterality Date   ABDOMINAL HYSTERECTOMY     Fibroids   BACK SURGERY     L4,L5 discectomy   CARDIAC CATHETERIZATION     CARPAL TUNNEL RELEASE Right 01/18/2017   COLONOSCOPY WITH PROPOFOL N/A 06/17/2021   Procedure: COLONOSCOPY WITH PROPOFOL;  Surgeon: Wilford Corner, MD;  Location: [facility];  Service: Endoscopy;  Laterality: N/A;   ESOPHAGOGASTRODUODENOSCOPY (EGD) WITH PROPOFOL N/A 06/17/2021   Procedure: ESOPHAGOGASTRODUODENOSCOPY (EGD) WITH PROPOFOL;  Surgeon: Wilford Corner, MD;  Location: [facility];  Service: Endoscopy;  Laterality: N/A;   KIDNEY SURGERY  1998   Right.  growth removed   POLYPECTOMY  06/17/2021   Procedure: POLYPECTOMY;  Surgeon: Wilford Corner, MD;  Location: [facility];  Service: Endoscopy;;  rectal prolapse repair  12/05/2011   ROTATOR CUFF REPAIR     Left   SHOULDER ARTHROSCOPY WITH SUBACROMIAL DECOMPRESSION, ROTATOR CUFF REPAIR AND BICEP TENDON REPAIR Left 05/25/2015   Procedure: SHOULDER ARTHROSCOPY WITH SUBACROMIAL DECOMPRESSION, ROTATOR CUFF REPAIR AND BICEP TENODESIS, DEBRIDEMENT. ;  Surgeon: Meredith Pel, MD;  Location:  [place];  Service: Orthopedics;  Laterality: Left;  LEFT SHOULDER ROTATOR CUFF TEAR REPAIR, ARTHROSCOPY, DEBRIDEMENT, BICEPS TENODESIS, SUBACROMIAL DECOMPRESSION.   TOTAL ABDOMINAL HYSTERECTOMY     TRIGGER FINGER RELEASE Right 04/30/2018   Procedure: RELEASE TRIGGER FINGER/A-1 PULLEY RIGHT MIDDLE  AND SMALLn ;  Surgeon: Daryll Brod, MD;  Location: [place];  Service: Orthopedics;  Laterality: Right;  FAB, Bier block   TUBAL LIGATION      SOCIAL HISTORY: Social History   Socioeconomic History   Marital status: Married    Spouse name: [name]   Number of children: 4   Years of education: 14   Highest education level: Not on file  Occupational History   Occupation: retired. prev worked at [employer].  Tobacco Use   Smoking status: Former    Types: Cigarettes    Quit date: 11/13/1980    Years since quitting: 40.6   Smokeless tobacco: Never   Tobacco comments:    quit in 1982  Substance and Sexual Activity   Alcohol use: No    Alcohol/week: 0.0 standard drinks   Drug use: No   Sexual activity: Yes  Other Topics Concern   Not on file  Social History Narrative   21 st January 2014 , patient underwent PS and MSLT - MSLT had one  SREMs and an average time to fall asleep of  *.8 minutes , her Ewort is 20 points,: facit: this patient has severe hypersomnia and is at risk when driving. medication in form ogf nuvigil smaples had been dispensed to her but she ha not yet taken it. 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 In detail -discussion of diagnosis and treatment  takes place today , first with nuvigil and if insufficient, with XYREM. Her CPAP treats her OSA very well, and she uses it 6 hours  or more each night,  residual AHI of 1.1  would not allow for OSA to be still explaining this degree of sleepiness. 2 downloads  were reviewed. labs reviewed.    Patient is married [name]) and lives at home with her husband and grandchild.   Patient has four children   Patient is retired.   Patient has a college education.   Patient is right-handed.   Patient does not drink any caffeine.      Social Determinants of Health   Financial Resource Strain: Not on file  Food Insecurity: Not on file  Transportation Needs: Not on file  Physical Activity: Not on file  Stress: Not on file  Social Connections: Not on file  Intimate Partner Violence: Not on file    FAMILY HISTORY: Family History  Problem Relation Age of Onset   Heart disease Mother    Throat cancer Father    Cancer Father        stomach   Kidney cancer Brother    Heart disease Brother     ALLERGIES:  is allergic to morphine and related.  MEDICATIONS:  Current Outpatient Medications  Medication  Sig Dispense Refill   acetaminophen (TYLENOL) 500 MG tablet Take 500 mg by mouth every 6 (six) hours as needed for moderate pain.     amLODipine (NORVASC) 10 MG tablet Take 10 mg by mouth daily after breakfast.     celecoxib (CELEBREX) 200 MG capsule Take 200 mg by mouth daily.     Cinnamon 500 MG capsule Take 500 mg by mouth daily.     diazepam (VALIUM) 2 MG tablet Take 1 mg by mouth at bedtime.     Ferrous Sulfate (IRON) 325 (65 Fe) MG TABS Take 1 tablet (325 mg total) by mouth daily. 30 tablet 0   fish oil-omega-3 fatty acids 1000 MG capsule Take 1 g by mouth daily.     gabapentin (NEURONTIN) 400 MG capsule Take 400 mg by mouth at bedtime.     hydrocortisone (ANUSOL-HC) 25 MG suppository Place 1 suppository (25 mg total) rectally at bedtime. 12 suppository 0   loperamide (IMODIUM A-D) 2 MG tablet Take 2 mg by mouth 4 (four) times daily as needed for diarrhea or loose stools.     meclizine (ANTIVERT) 25 MG tablet Take 1 tablet (25 mg total) by mouth 3 (three) times daily as needed for dizziness. 30 tablet 0   metFORMIN (GLUCOPHAGE) 500 MG tablet Take  500 mg by mouth 2 (two) times daily with a meal.     metoprolol (TOPROL-XL) 50 MG 24 hr tablet Take 50 mg by mouth daily after breakfast.     Polyethyl Glycol-Propyl Glycol 0.4-0.3 % SOLN Place 1 drop into both eyes daily.     pravastatin (PRAVACHOL) 40 MG tablet Take 40 mg by mouth at bedtime.     sertraline (ZOLOFT) 25 MG tablet Take 25 mg by mouth at bedtime.     cyclobenzaprine (FLEXERIL) 10 MG tablet Take 5-10 mg by mouth at bedtime as needed.     methocarbamol (ROBAXIN) 500 MG tablet Take 1 tablet (500 mg total) by mouth every 8 (eight) hours as needed for muscle spasms. 10 tablet 0   No current facility-administered medications for this visit.    REVIEW OF SYSTEMS:   Constitutional: ( - ) fevers, ( - )  chills , ( - ) night sweats Eyes: ( - ) blurriness of vision, ( - ) double vision, ( - ) watery eyes Ears, nose, mouth, throat, and face: ( - ) mucositis, ( - ) sore throat Respiratory: ( - ) cough, ( - ) dyspnea, ( - ) wheezes Cardiovascular: ( - ) palpitation, ( - ) chest discomfort, ( - ) lower extremity swelling Gastrointestinal:  ( - ) nausea, ( - ) heartburn, ( - ) change in bowel habits Skin: ( - ) abnormal skin rashes Lymphatics: ( - ) new lymphadenopathy, ( - ) easy bruising Neurological: ( - ) numbness, ( - ) tingling, ( - ) new weaknesses Behavioral/Psych: ( - ) mood change, ( - ) new changes  All other systems were reviewed with the patient and are negative.  PHYSICAL EXAMINATION: ECOG PERFORMANCE STATUS: 1 - Symptomatic but completely ambulatory  Vitals:   07/06/21 1056  BP: (!) 134/56  Pulse: 77  Resp: 16  Temp: 98 F (36.7 C)  SpO2: 100%   Filed Weights   07/06/21 1056  Weight: 107 lb 12.8 oz (48.9 kg)    GENERAL: well appearing female in NAD  SKIN: skin color, texture, turgor are normal, no rashes or significant lesions EYES: conjunctiva are pink and non-injected, sclera clear OROPHARYNX: no  exudate, no erythema; lips, buccal mucosa, and tongue normal   LYMPH:  no palpable lymphadenopathy in the cervical or supraclavicular lymph nodes.  LUNGS: clear to auscultation and percussion with normal breathing effort HEART: regular rate & rhythm and no murmurs and no lower extremity edema ABDOMEN: soft, non-tender, non-distended, normal bowel sounds Musculoskeletal: no cyanosis of digits and no clubbing  PSYCH: alert & oriented x 3, fluent speech NEURO: no focal motor/sensory deficits  LABORATORY DATA:  I have reviewed the data as listed CBC Latest Ref Rng & Units 07/06/2021 06/23/2021 06/22/2021  WBC 4.0 - 10.5 K/uL 6.3 6.9 -  Hemoglobin 12.0 - 15.0 g/dL 9.4(L) 9.3(L) 9.7(L)  Hematocrit 36.0 - 46.0 % 28.8(L) 28.8(L) 29.9(L)  Platelets 150 - 400 K/uL 459(H) 175 -    CMP Latest Ref Rng & Units 07/06/2021 06/23/2021 06/20/2021  Glucose 70 - 99 mg/dL 103(H) 101(H) 101(H)  BUN 8 - 23 mg/dL 24(H) 35(H) 34(H)  Creatinine 0.44 - 1.00 mg/dL 1.30(H) 1.43(H) 1.56(H)  Sodium 135 - 145 mmol/L 142 140 138  Potassium 3.5 - 5.1 mmol/L 4.3 4.4 4.9  Chloride 98 - 111 mmol/L 111 108 105  CO2 22 - 32 mmol/L '22 24 25  '$ Calcium 8.9 - 10.3 mg/dL 9.6 9.4 9.2  Total Protein 6.5 - 8.1 g/dL 7.0 - 6.9  Total Bilirubin 0.3 - 1.2 mg/dL 0.3 - 0.5  Alkaline Phos 38 - 126 U/L 53 - 51  AST 15 - 41 U/L 19 - 22  ALT 0 - 44 U/L 13 - 14    RADIOGRAPHIC STUDIES: I have personally reviewed the radiological images as listed and agreed with the findings in the report. CT HEAD WO CONTRAST (5MM)  Result Date: 06/15/2021 CLINICAL DATA:  Neck pain, acute, no red flags fall EXAM: CT HEAD WITHOUT CONTRAST CT CERVICAL SPINE WITHOUT CONTRAST TECHNIQUE: Multidetector CT imaging of the head and cervical spine was performed following the standard protocol without intravenous contrast. Multiplanar CT image reconstructions of the cervical spine were also generated. COMPARISON:  MR head 03/10/2021, CT head 03/10/2021 FINDINGS: CT HEAD FINDINGS Brain: Patchy and confluent areas of decreased  attenuation are noted throughout the deep and periventricular white matter of the cerebral hemispheres bilaterally, compatible with chronic microvascular ischemic disease. No evidence of large-territorial acute infarction. No parenchymal hemorrhage. No mass lesion. No extra-axial collection. No mass effect or midline shift. No hydrocephalus. Basilar cisterns are patent. Vascular: No hyperdense vessel. Skull: No acute fracture or focal lesion. Sinuses/Orbits: Paranasal sinuses and mastoid air cells are clear. Bilateral lens replacement. Otherwise the orbits are unremarkable. Other: None. CT CERVICAL SPINE FINDINGS Alignment: Normal. Skull base and vertebrae: Multilevel degenerative changes of the spine. No acute fracture. No aggressive appearing focal osseous lesion or focal pathologic process. Soft tissues and spinal canal: No prevertebral fluid or swelling. No visible canal hematoma. Upper chest: Unremarkable. Other: Partially visualized hypodensity and calcification within the right thyroid gland. IMPRESSION: 1. No acute intracranial abnormality. 2. No acute displaced fracture or traumatic listhesis of the cervical spine. Electronically Signed   By: [name] M.D.   On: 06/15/2021 20:04   CT Cervical Spine Wo Contrast  Result Date: 06/15/2021 CLINICAL DATA:  Neck pain, acute, no red flags fall EXAM: CT HEAD WITHOUT CONTRAST CT CERVICAL SPINE WITHOUT CONTRAST TECHNIQUE: Multidetector CT imaging of the head and cervical spine was performed following the standard protocol without intravenous contrast. Multiplanar CT image reconstructions of the cervical spine were also generated. COMPARISON:  MR head 03/10/2021, CT head  03/10/2021 FINDINGS: CT HEAD FINDINGS Brain: Patchy and confluent areas of decreased attenuation are noted throughout the deep and periventricular white matter of the cerebral hemispheres bilaterally, compatible with chronic microvascular ischemic disease. No evidence of large-territorial  acute infarction. No parenchymal hemorrhage. No mass lesion. No extra-axial collection. No mass effect or midline shift. No hydrocephalus. Basilar cisterns are patent. Vascular: No hyperdense vessel. Skull: No acute fracture or focal lesion. Sinuses/Orbits: Paranasal sinuses and mastoid air cells are clear. Bilateral lens replacement. Otherwise the orbits are unremarkable. Other: None. CT CERVICAL SPINE FINDINGS Alignment: Normal. Skull base and vertebrae: Multilevel degenerative changes of the spine. No acute fracture. No aggressive appearing focal osseous lesion or focal pathologic process. Soft tissues and spinal canal: No prevertebral fluid or swelling. No visible canal hematoma. Upper chest: Unremarkable. Other: Partially visualized hypodensity and calcification within the right thyroid gland. IMPRESSION: 1. No acute intracranial abnormality. 2. No acute displaced fracture or traumatic listhesis of the cervical spine. Electronically Signed   By: [name] M.D.   On: 06/15/2021 20:04   VAS [place] LOWER EXTREMITY VENOUS (DVT)  Result Date: 06/23/2021  Lower Venous DVT Study Patient Name:  DAYANNE ELFERING [facility]  Date of Exam:   06/23/2021 Medical Rec #: [id number]           Accession #:    [id number] Date of Birth: 1941-06-08            Patient Gender: F Patient Age:   75 years Exam Location:  [facility] Procedure:      VAS [place] LOWER EXTREMITY VENOUS (DVT) Referring Phys: Eulogio Bear --------------------------------------------------------------------------------  Indications: Pain.  Risk Factors: None identified. Comparison Study: No prior studies. Performing Technologist: Oliver Hum RVT  Examination Guidelines: A complete evaluation includes B-mode imaging, spectral Doppler, color Doppler, and power Doppler as needed of all accessible portions of each vessel. Bilateral testing is considered an integral part of a complete examination. Limited examinations for reoccurring indications may be performed  as noted. The reflux portion of the exam is performed with the patient in reverse Trendelenburg.  +---------+---------------+---------+-----------+----------+--------------+ RIGHT    CompressibilityPhasicitySpontaneityPropertiesThrombus Aging +---------+---------------+---------+-----------+----------+--------------+ CFV      Full           Yes      Yes                                 +---------+---------------+---------+-----------+----------+--------------+ SFJ      Full                                                        +---------+---------------+---------+-----------+----------+--------------+ FV Prox  Full                                                        +---------+---------------+---------+-----------+----------+--------------+ FV Mid   Full                                                        +---------+---------------+---------+-----------+----------+--------------+  FV DistalFull                                                        +---------+---------------+---------+-----------+----------+--------------+ PFV      Full                                                        +---------+---------------+---------+-----------+----------+--------------+ POP      Full           Yes      Yes                                 +---------+---------------+---------+-----------+----------+--------------+ PTV      Full                                                        +---------+---------------+---------+-----------+----------+--------------+ PERO     Full                                                        +---------+---------------+---------+-----------+----------+--------------+   +---------+---------------+---------+-----------+----------+--------------+ LEFT     CompressibilityPhasicitySpontaneityPropertiesThrombus Aging +---------+---------------+---------+-----------+----------+--------------+ CFV      Full            Yes      Yes                                 +---------+---------------+---------+-----------+----------+--------------+ SFJ      Full                                                        +---------+---------------+---------+-----------+----------+--------------+ FV Prox  Full                                                        +---------+---------------+---------+-----------+----------+--------------+ FV Mid   Full                                                        +---------+---------------+---------+-----------+----------+--------------+ FV DistalFull                                                        +---------+---------------+---------+-----------+----------+--------------+  PFV      Full                                                        +---------+---------------+---------+-----------+----------+--------------+ POP      Full           Yes      Yes                                 +---------+---------------+---------+-----------+----------+--------------+ PTV      Full                                                        +---------+---------------+---------+-----------+----------+--------------+ PERO     Full                                                        +---------+---------------+---------+-----------+----------+--------------+     Summary: RIGHT: - There is no evidence of deep vein thrombosis in the lower extremity.  - No cystic structure found in the popliteal fossa.  LEFT: - There is no evidence of deep vein thrombosis in the lower extremity.  - No cystic structure found in the popliteal fossa.  *See table(s) above for measurements and observations. Electronically signed by Monica Martinez MD on 06/23/2021 at 2:09:45 PM.    Final    CT Angio Abd/Pel w/ and/or w/o  Result Date: 06/15/2021 CLINICAL DATA:  80 y/o female having frank bloody stools.  GI bleed EXAM: CTA ABDOMEN AND PELVIS WITHOUT AND WITH CONTRAST  TECHNIQUE: Multidetector CT imaging of the abdomen and pelvis was performed using the standard protocol during bolus administration of intravenous contrast. Multiplanar reconstructed images and MIPs were obtained and reviewed to evaluate the vascular anatomy. CONTRAST:  40m OMNIPAQUE IOHEXOL 350 MG/ML SOLN COMPARISON:  None. FINDINGS: VASCULAR Aorta: Moderate atherosclerotic plaque. Normal caliber aorta without aneurysm, dissection, vasculitis or significant stenosis. Celiac: Patent without evidence of aneurysm, dissection, vasculitis or significant stenosis. SMA: Patent without evidence of aneurysm, dissection, vasculitis or significant stenosis. Renals: Mild to moderate atherosclerotic plaque. Both renal arteries are patent without evidence of aneurysm, dissection, vasculitis, fibromuscular dysplasia or significant stenosis. IMA: Mild atherosclerotic plaque. Patent without evidence of aneurysm, dissection, vasculitis or significant stenosis. Inflow: Patent without evidence of aneurysm, dissection, vasculitis or significant stenosis. Proximal Outflow: Bilateral common femoral and visualized portions of the superficial and profunda femoral arteries are patent without evidence of aneurysm, dissection, vasculitis or significant stenosis. Veins: No obvious venous abnormality within the limitations of this arterial phase study. Review of the MIP images confirms the above findings. NON-VASCULAR Lower chest: Bilateral lower lobe subsegmental atelectasis. Hepatobiliary: Subcentimeter hypodensity too small to characterize. No focal liver abnormality. No gallstones, gallbladder wall thickening, or pericholecystic fluid. No biliary dilatation. Pancreas: No focal lesion. Normal pancreatic contour. No surrounding inflammatory changes. No main pancreatic ductal dilatation. Spleen: Normal in size without focal abnormality. Adrenals/Urinary Tract: No adrenal nodule bilaterally. Bilateral kidneys enhance symmetrically.  Calcified  stone 7 mm in the left kidney. Pericentimeter fluid density lesions within the kidneys likely represent simple renal cysts. No hydronephrosis. No hydroureter. The urinary bladder is unremarkable. Stomach/Bowel: Stomach is within normal limits. No evidence of bowel wall thickening or dilatation. No intravenous contrast extravasation into the lumen of the small or large bowel identified. Diffuse colonic diverticulosis. Appendix appears normal. Lymphatic: No lymphadenopathy. Reproductive: Status post hysterectomy. No adnexal masses. Other: No intraperitoneal free fluid. No intraperitoneal free gas. No organized fluid collection. Musculoskeletal: No abdominal wall hernia or abnormality. No suspicious lytic or blastic osseous lesions. No acute displaced fracture. Multilevel degenerative changes of the spine. IMPRESSION: VASCULAR 1. No CT findings of active gastrointestinal hemorrhage. This is not exclude a bleed as this may be intermittent or too slow to be identified on CT. 2.  Aortic Atherosclerosis (ICD10-I70.0). NON-VASCULAR 1. Nonobstructive 7 mm left nephrolithiasis. 2. Scattered colonic diverticulosis with no acute diverticulitis. 3. Status post sigmoid resection. Electronically Signed   By: [name] M.D.   On: 06/15/2021 20:27    ASSESSMENT & PLAN NAIJA NERIO is a 80 y.o. female who presents to the clinic after recent hospitalization for iron deficiency anemia due to rectal bleeding. Patient is currently on ferrous sulfate 325 mg once daily with good tolerance. Most recent EGD and colonoscopy from 06/17/2021 with no signs of active bleeding. Felt to be secondary to diverticular bleeding exacerbated by use of anticoagulation. Xarelto was stopped for this reason.   I recommend for patient to proceed with repeat labs today to check CBC, CMP, Iron and TIBC, Ferritin and Retic Panel. If anemia has not improved with oral supplementation, then I recommend IV venofer.  #Iron deficiency anemia 2/2 GI  bleeding: --Currently on ferrous sulfate 325 mg once daily. Advised to continue with a source of vitamin C. --Under the care of [facility] with Deliah Goody PA-C. Most recent EGD and colonoscopy from 06/17/2021 with no signs of active bleeding.  --Gave list of iron rich foods to incorporate into diet.  --Labs today to check CBC, CMP, Iron and TIBC, ferritin and retic panel --Recommend IV venofer infusions if there is persistent anemia since patient has persistent fatigue.   #DVT of right femoral vein: --Diagnosed in 06/03/2019. CTA negative for PE. Started on Xarelto.  --Discontinued with recent hospitalization for GI bleed.   Orders Placed This Encounter  Procedures   CBC with Differential ([place] Only)    Standing Status:   Future    Number of Occurrences:   1    Standing Expiration Date:   07/06/2022   CMP ([place] only)    Standing Status:   Future    Number of Occurrences:   1    Standing Expiration Date:   07/06/2022   Iron and TIBC    Standing Status:   Future    Number of Occurrences:   1    Standing Expiration Date:   07/06/2022   Retic Panel    Standing Status:   Future    Number of Occurrences:   1    Standing Expiration Date:   07/06/2022   Ferritin    Standing Status:   Future    Number of Occurrences:   1    Standing Expiration Date:   07/06/2022    All questions were answered. The patient knows to call the clinic with any problems, questions or concerns.  I have spent a total of 60 minutes minutes of face-to-face and non-face-to-face time,  preparing to see the patient, obtaining and/or reviewing separately obtained history, performing a medically appropriate examination, counseling and educating the patient, ordering tests, documenting clinical information in the electronic health record, and care coordination.   Dede Query, PA-C Department of Hematology/Oncology [place] at [facility] Phone: [phone]

## 2021-07-06 ENCOUNTER — Inpatient Hospital Stay: Payer: Medicare Other

## 2021-07-06 ENCOUNTER — Other Ambulatory Visit: Payer: Self-pay

## 2021-07-06 ENCOUNTER — Inpatient Hospital Stay: Payer: Medicare Other | Attending: Hematology and Oncology | Admitting: Physician Assistant

## 2021-07-06 ENCOUNTER — Telehealth: Payer: Self-pay | Admitting: Physician Assistant

## 2021-07-06 ENCOUNTER — Encounter: Payer: Self-pay | Admitting: Physician Assistant

## 2021-07-06 VITALS — BP 134/56 | HR 77 | Temp 98.0°F | Resp 16 | Ht 59.0 in | Wt 107.8 lb

## 2021-07-06 DIAGNOSIS — Z87891 Personal history of nicotine dependence: Secondary | ICD-10-CM | POA: Diagnosis not present

## 2021-07-06 DIAGNOSIS — Z8051 Family history of malignant neoplasm of kidney: Secondary | ICD-10-CM | POA: Diagnosis not present

## 2021-07-06 DIAGNOSIS — Z800 Family history of malignant neoplasm of digestive organs: Secondary | ICD-10-CM | POA: Diagnosis not present

## 2021-07-06 DIAGNOSIS — D500 Iron deficiency anemia secondary to blood loss (chronic): Secondary | ICD-10-CM | POA: Diagnosis not present

## 2021-07-06 DIAGNOSIS — Z86718 Personal history of other venous thrombosis and embolism: Secondary | ICD-10-CM

## 2021-07-06 DIAGNOSIS — K922 Gastrointestinal hemorrhage, unspecified: Secondary | ICD-10-CM

## 2021-07-06 DIAGNOSIS — Z808 Family history of malignant neoplasm of other organs or systems: Secondary | ICD-10-CM

## 2021-07-06 DIAGNOSIS — D509 Iron deficiency anemia, unspecified: Secondary | ICD-10-CM | POA: Insufficient documentation

## 2021-07-06 LAB — RETIC PANEL
Immature Retic Fract: 9.1 % (ref 2.3–15.9)
RBC.: 3.13 MIL/uL — ABNORMAL LOW (ref 3.87–5.11)
Retic Count, Absolute: 49.8 10*3/uL (ref 19.0–186.0)
Retic Ct Pct: 1.6 % (ref 0.4–3.1)
Reticulocyte Hemoglobin: 32.1 pg (ref >27.9)

## 2021-07-06 LAB — CMP (CANCER CENTER ONLY)
ALT: 13 U/L (ref 0–44)
AST: 19 U/L (ref 15–41)
Albumin: 3.7 g/dL (ref 3.5–5.0)
Alkaline Phosphatase: 53 U/L (ref 38–126)
Anion gap: 9 (ref 5–15)
BUN: 24 mg/dL — ABNORMAL HIGH (ref 8–23)
CO2: 22 mmol/L (ref 22–32)
Calcium: 9.6 mg/dL (ref 8.9–10.3)
Chloride: 111 mmol/L (ref 98–111)
Creatinine: 1.3 mg/dL — ABNORMAL HIGH (ref 0.44–1.00)
GFR, Estimated: 41 mL/min — ABNORMAL LOW (ref >60)
Glucose, Bld: 103 mg/dL — ABNORMAL HIGH (ref 70–99)
Potassium: 4.3 mmol/L (ref 3.5–5.1)
Sodium: 142 mmol/L (ref 135–145)
Total Bilirubin: 0.3 mg/dL (ref 0.3–1.2)
Total Protein: 7 g/dL (ref 6.5–8.1)

## 2021-07-06 LAB — CBC WITH DIFFERENTIAL (CANCER CENTER ONLY)
Abs Immature Granulocytes: 0.01 10*3/uL (ref 0.00–0.07)
Basophils Absolute: 0 10*3/uL (ref 0.0–0.1)
Basophils Relative: 1 %
Eosinophils Absolute: 0.2 10*3/uL (ref 0.0–0.5)
Eosinophils Relative: 4 %
HCT: 28.8 % — ABNORMAL LOW (ref 36.0–46.0)
Hemoglobin: 9.4 g/dL — ABNORMAL LOW (ref 12.0–15.0)
Immature Granulocytes: 0 %
Lymphocytes Relative: 21 %
Lymphs Abs: 1.3 10*3/uL (ref 0.7–4.0)
MCH: 29.7 pg (ref 26.0–34.0)
MCHC: 32.6 g/dL (ref 30.0–36.0)
MCV: 91.1 fL (ref 80.0–100.0)
Monocytes Absolute: 0.4 10*3/uL (ref 0.1–1.0)
Monocytes Relative: 6 %
Neutro Abs: 4.3 10*3/uL (ref 1.7–7.7)
Neutrophils Relative %: 68 %
Platelet Count: 459 10*3/uL — ABNORMAL HIGH (ref 150–400)
RBC: 3.16 MIL/uL — ABNORMAL LOW (ref 3.87–5.11)
RDW: 15.5 % (ref 11.5–15.5)
WBC Count: 6.3 10*3/uL (ref 4.0–10.5)
nRBC: 0 % (ref 0.0–0.2)

## 2021-07-06 LAB — IRON AND TIBC
Iron: 79 ug/dL (ref 41–142)
Saturation Ratios: 21 % (ref 21–57)
TIBC: 370 ug/dL (ref 236–444)
UIBC: 291 ug/dL (ref 120–384)

## 2021-07-06 LAB — FERRITIN: Ferritin: 20 ng/mL (ref 11–307)

## 2021-07-06 NOTE — Patient Instructions (Signed)
Thank you for choosing [facility] to provide your care.   Should you have questions after your visit to the [facility] ([facility]), please contact this office at [phone] between 8:30 AM and 4:30 PM.  Voice mails left after 4:00 PM may not be returned until the following business day.  Calls received after 4:30 PM will be answered by an off-site Nurse Triage Line.    Prescription Refills:  Please have your pharmacy contact us directly for most prescription requests.  Contact the office directly for refills of narcotics (pain medications). Allow 48-72 hours for refills.  Appointments: Please contact the [facility] scheduling department [phone] for questions regarding [facility] appointment scheduling.  Contact the schedulers with any scheduling changes so that your appointment can be rescheduled in a timely manner.   Central Scheduling for [place] [phone] - Call to schedule procedures such as PET scans, CT scans, MRI, Ultrasound, etc.  To afford each patient quality time with our providers, please arrive 30 minutes before your scheduled appointment time.  If you arrive late for your appointment, you may be asked to reschedule.  We strive to give you quality time with our providers, and arriving late affects you and other patients whose appointments are after yours. If you are a no show for multiple scheduled visits, you may be dismissed from the clinic at the providers discretion.     Resources: [facility] Social Workers [phone] for additional information on assistance programs or assistance connecting with community support programs   [place] DSS  [phone]: Information regarding food stamps, Medicaid, and utility assistance [place] Access [place] [phone]   [place] [employer]'s shared-ride transportation service for eligible riders who have a disability that prevents them from riding the fixed route bus.   Medicare Rights Center 800-333-4114  Helps people with Medicare understand their rights and benefits, navigate the Medicare system, and secure the quality healthcare they deserve [employer] 800-227-2345 Assists patients locate various types of support and financial assistance Cancer Care: 1-800-813-HOPE (4673) Provides financial assistance, online support groups, medication/co-pay assistance.   Transportation Assistance for appointments at [facility]: Transportation Coordinator [phone]  Again, thank you for choosing [facility] for your care.       

## 2021-07-06 NOTE — Telephone Encounter (Signed)
I called and spoke to [name] to review the labs from today. There is persistent anemia with Hgb of 9.4 and mild thrombocytopenia with platelet of 459K. Iron panel showed iron saturation improved to 21% and ferritin improved to 20. Since patient is still quite fatigued and Hgb hasn't changed, we recommend IV venofer x 5 doses. I will see the patient back in 6 weeks after completion of IV iron.   Patient expressed understanding and satisfaction with the plan provided.

## 2021-07-14 DIAGNOSIS — R197 Diarrhea, unspecified: Secondary | ICD-10-CM | POA: Diagnosis not present

## 2021-07-20 DIAGNOSIS — N1832 Chronic kidney disease, stage 3b: Secondary | ICD-10-CM | POA: Diagnosis not present

## 2021-07-20 DIAGNOSIS — K921 Melena: Secondary | ICD-10-CM | POA: Diagnosis not present

## 2021-07-20 DIAGNOSIS — D500 Iron deficiency anemia secondary to blood loss (chronic): Secondary | ICD-10-CM | POA: Diagnosis not present

## 2021-07-20 DIAGNOSIS — D509 Iron deficiency anemia, unspecified: Secondary | ICD-10-CM | POA: Diagnosis not present

## 2021-07-20 DIAGNOSIS — K8681 Exocrine pancreatic insufficiency: Secondary | ICD-10-CM | POA: Diagnosis not present

## 2021-07-20 DIAGNOSIS — R197 Diarrhea, unspecified: Secondary | ICD-10-CM | POA: Diagnosis not present

## 2021-07-27 ENCOUNTER — Other Ambulatory Visit: Payer: Self-pay

## 2021-07-27 ENCOUNTER — Inpatient Hospital Stay: Payer: Medicare Other | Attending: Hematology and Oncology

## 2021-07-27 VITALS — BP 136/58 | HR 93 | Temp 97.6°F | Resp 16

## 2021-07-27 DIAGNOSIS — K922 Gastrointestinal hemorrhage, unspecified: Secondary | ICD-10-CM | POA: Insufficient documentation

## 2021-07-27 DIAGNOSIS — D500 Iron deficiency anemia secondary to blood loss (chronic): Secondary | ICD-10-CM | POA: Diagnosis not present

## 2021-07-27 DIAGNOSIS — D508 Other iron deficiency anemias: Secondary | ICD-10-CM

## 2021-07-27 MED ORDER — SODIUM CHLORIDE 0.9 % IV SOLN
200.0000 mg | Freq: Once | INTRAVENOUS | Status: AC
  Administered 2021-07-27: 200 mg via INTRAVENOUS
  Filled 2021-07-27: qty 200

## 2021-07-27 MED ORDER — SODIUM CHLORIDE 0.9 % IV SOLN: Freq: Once | INTRAVENOUS | Status: AC

## 2021-07-27 NOTE — Patient Instructions (Signed)

## 2021-07-29 ENCOUNTER — Telehealth: Payer: Self-pay | Admitting: Physician Assistant

## 2021-07-29 NOTE — Telephone Encounter (Signed)
Sch per 8/24 los,pt aware

## 2021-08-01 ENCOUNTER — Other Ambulatory Visit: Payer: Self-pay

## 2021-08-01 ENCOUNTER — Inpatient Hospital Stay: Payer: Medicare Other

## 2021-08-01 VITALS — BP 136/64 | HR 80 | Temp 97.8°F | Resp 18

## 2021-08-01 DIAGNOSIS — K922 Gastrointestinal hemorrhage, unspecified: Secondary | ICD-10-CM | POA: Diagnosis not present

## 2021-08-01 DIAGNOSIS — D508 Other iron deficiency anemias: Secondary | ICD-10-CM

## 2021-08-01 DIAGNOSIS — D500 Iron deficiency anemia secondary to blood loss (chronic): Secondary | ICD-10-CM | POA: Diagnosis not present

## 2021-08-01 MED ORDER — SODIUM CHLORIDE 0.9 % IV SOLN: Freq: Once | INTRAVENOUS | Status: AC

## 2021-08-01 MED ORDER — SODIUM CHLORIDE 0.9 % IV SOLN
200.0000 mg | Freq: Once | INTRAVENOUS | Status: AC
  Administered 2021-08-01: 200 mg via INTRAVENOUS
  Filled 2021-08-01: qty 200

## 2021-08-01 NOTE — Patient Instructions (Signed)

## 2021-08-08 ENCOUNTER — Other Ambulatory Visit: Payer: Self-pay

## 2021-08-08 ENCOUNTER — Inpatient Hospital Stay: Payer: Medicare Other

## 2021-08-08 VITALS — BP 148/73 | HR 80 | Temp 98.4°F | Resp 17

## 2021-08-08 DIAGNOSIS — K922 Gastrointestinal hemorrhage, unspecified: Secondary | ICD-10-CM | POA: Diagnosis not present

## 2021-08-08 DIAGNOSIS — D500 Iron deficiency anemia secondary to blood loss (chronic): Secondary | ICD-10-CM | POA: Diagnosis not present

## 2021-08-08 DIAGNOSIS — D508 Other iron deficiency anemias: Secondary | ICD-10-CM

## 2021-08-08 MED ORDER — SODIUM CHLORIDE 0.9 % IV SOLN: Freq: Once | INTRAVENOUS | Status: AC

## 2021-08-08 MED ORDER — SODIUM CHLORIDE 0.9 % IV SOLN
200.0000 mg | Freq: Once | INTRAVENOUS | Status: AC
  Administered 2021-08-08: 200 mg via INTRAVENOUS
  Filled 2021-08-08: qty 200

## 2021-08-08 NOTE — Patient Instructions (Signed)

## 2021-08-09 ENCOUNTER — Other Ambulatory Visit: Payer: Self-pay | Admitting: Family Medicine

## 2021-08-09 ENCOUNTER — Ambulatory Visit
Admission: RE | Admit: 2021-08-09 | Discharge: 2021-08-09 | Disposition: A | Discharge status: Home / Self Care | Payer: Medicare Other | Source: Ambulatory Visit | Attending: Family Medicine | Admitting: Family Medicine

## 2021-08-09 DIAGNOSIS — D500 Iron deficiency anemia secondary to blood loss (chronic): Secondary | ICD-10-CM | POA: Diagnosis not present

## 2021-08-09 DIAGNOSIS — M25551 Pain in right hip: Secondary | ICD-10-CM

## 2021-08-09 DIAGNOSIS — M79604 Pain in right leg: Secondary | ICD-10-CM | POA: Diagnosis not present

## 2021-08-09 DIAGNOSIS — M545 Low back pain, unspecified: Secondary | ICD-10-CM

## 2021-08-09 DIAGNOSIS — N1832 Chronic kidney disease, stage 3b: Secondary | ICD-10-CM | POA: Diagnosis not present

## 2021-08-15 ENCOUNTER — Other Ambulatory Visit: Payer: Self-pay

## 2021-08-15 ENCOUNTER — Inpatient Hospital Stay: Payer: Medicare Other | Attending: Hematology and Oncology

## 2021-08-15 VITALS — BP 132/61 | HR 73 | Temp 98.6°F | Resp 18 | Ht 59.0 in | Wt 107.5 lb

## 2021-08-15 DIAGNOSIS — D500 Iron deficiency anemia secondary to blood loss (chronic): Secondary | ICD-10-CM | POA: Diagnosis not present

## 2021-08-15 DIAGNOSIS — K922 Gastrointestinal hemorrhage, unspecified: Secondary | ICD-10-CM | POA: Insufficient documentation

## 2021-08-15 DIAGNOSIS — D508 Other iron deficiency anemias: Secondary | ICD-10-CM

## 2021-08-15 MED ORDER — SODIUM CHLORIDE 0.9 % IV SOLN: Freq: Once | INTRAVENOUS | Status: AC

## 2021-08-15 MED ORDER — SODIUM CHLORIDE 0.9 % IV SOLN
200.0000 mg | Freq: Once | INTRAVENOUS | Status: AC
  Administered 2021-08-15: 200 mg via INTRAVENOUS
  Filled 2021-08-15: qty 200

## 2021-08-15 NOTE — Patient Instructions (Signed)

## 2021-08-19 DIAGNOSIS — D500 Iron deficiency anemia secondary to blood loss (chronic): Secondary | ICD-10-CM | POA: Diagnosis not present

## 2021-08-22 ENCOUNTER — Ambulatory Visit: Payer: Medicare Other

## 2021-08-24 DIAGNOSIS — K8681 Exocrine pancreatic insufficiency: Secondary | ICD-10-CM | POA: Diagnosis not present

## 2021-08-24 DIAGNOSIS — D509 Iron deficiency anemia, unspecified: Secondary | ICD-10-CM | POA: Diagnosis not present

## 2021-08-25 ENCOUNTER — Ambulatory Visit (INDEPENDENT_AMBULATORY_CARE_PROVIDER_SITE_OTHER): Payer: Medicare Other | Admitting: Surgery

## 2021-08-25 ENCOUNTER — Ambulatory Visit: Payer: Self-pay

## 2021-08-25 ENCOUNTER — Encounter: Payer: Self-pay | Admitting: Surgery

## 2021-08-25 ENCOUNTER — Other Ambulatory Visit: Payer: Self-pay

## 2021-08-25 VITALS — BP 169/69 | HR 65 | Ht 59.0 in | Wt 107.5 lb

## 2021-08-25 DIAGNOSIS — M5441 Lumbago with sciatica, right side: Secondary | ICD-10-CM

## 2021-08-26 ENCOUNTER — Other Ambulatory Visit: Payer: Self-pay | Admitting: Pharmacist

## 2021-08-26 MED FILL — Iron Sucrose Inj 20 MG/ML (Fe Equiv): INTRAVENOUS | Qty: 10 | Status: AC

## 2021-08-27 ENCOUNTER — Inpatient Hospital Stay: Payer: Medicare Other

## 2021-08-27 ENCOUNTER — Other Ambulatory Visit: Payer: Self-pay

## 2021-08-27 VITALS — BP 152/66 | HR 71 | Temp 97.0°F | Resp 18

## 2021-08-27 DIAGNOSIS — K922 Gastrointestinal hemorrhage, unspecified: Secondary | ICD-10-CM | POA: Diagnosis not present

## 2021-08-27 DIAGNOSIS — D500 Iron deficiency anemia secondary to blood loss (chronic): Secondary | ICD-10-CM | POA: Diagnosis not present

## 2021-08-27 DIAGNOSIS — D508 Other iron deficiency anemias: Secondary | ICD-10-CM

## 2021-08-27 MED ORDER — SODIUM CHLORIDE 0.9 % IV SOLN
200.0000 mg | Freq: Once | INTRAVENOUS | Status: AC
  Administered 2021-08-27: 200 mg via INTRAVENOUS
  Filled 2021-08-27: qty 200

## 2021-08-27 MED ORDER — SODIUM CHLORIDE 0.9 % IV SOLN: Freq: Once | INTRAVENOUS | Status: AC

## 2021-08-27 NOTE — Patient Instructions (Signed)

## 2021-08-29 ENCOUNTER — Ambulatory Visit (INDEPENDENT_AMBULATORY_CARE_PROVIDER_SITE_OTHER): Payer: Medicare Other

## 2021-08-29 ENCOUNTER — Other Ambulatory Visit: Payer: Self-pay

## 2021-08-29 DIAGNOSIS — M5441 Lumbago with sciatica, right side: Secondary | ICD-10-CM

## 2021-08-29 DIAGNOSIS — M48061 Spinal stenosis, lumbar region without neurogenic claudication: Secondary | ICD-10-CM | POA: Diagnosis not present

## 2021-09-07 ENCOUNTER — Inpatient Hospital Stay (HOSPITAL_BASED_OUTPATIENT_CLINIC_OR_DEPARTMENT_OTHER): Payer: Medicare Other | Admitting: Physician Assistant

## 2021-09-07 ENCOUNTER — Other Ambulatory Visit: Payer: Self-pay

## 2021-09-07 ENCOUNTER — Inpatient Hospital Stay: Payer: Medicare Other

## 2021-09-07 ENCOUNTER — Other Ambulatory Visit: Payer: Self-pay | Admitting: Physician Assistant

## 2021-09-07 VITALS — BP 157/64 | HR 68 | Temp 98.5°F | Resp 17 | Wt 108.4 lb

## 2021-09-07 DIAGNOSIS — D508 Other iron deficiency anemias: Secondary | ICD-10-CM

## 2021-09-07 DIAGNOSIS — D500 Iron deficiency anemia secondary to blood loss (chronic): Secondary | ICD-10-CM | POA: Diagnosis not present

## 2021-09-07 DIAGNOSIS — K922 Gastrointestinal hemorrhage, unspecified: Secondary | ICD-10-CM | POA: Diagnosis not present

## 2021-09-07 LAB — CBC WITH DIFFERENTIAL (CANCER CENTER ONLY)
Abs Immature Granulocytes: 0.01 10*3/uL (ref 0.00–0.07)
Basophils Absolute: 0 10*3/uL (ref 0.0–0.1)
Basophils Relative: 1 %
Eosinophils Absolute: 0.2 10*3/uL (ref 0.0–0.5)
Eosinophils Relative: 4 %
HCT: 35.5 % — ABNORMAL LOW (ref 36.0–46.0)
Hemoglobin: 11.3 g/dL — ABNORMAL LOW (ref 12.0–15.0)
Immature Granulocytes: 0 %
Lymphocytes Relative: 24 %
Lymphs Abs: 1.1 10*3/uL (ref 0.7–4.0)
MCH: 29.6 pg (ref 26.0–34.0)
MCHC: 31.8 g/dL (ref 30.0–36.0)
MCV: 92.9 fL (ref 80.0–100.0)
Monocytes Absolute: 0.4 10*3/uL (ref 0.1–1.0)
Monocytes Relative: 8 %
Neutro Abs: 2.8 10*3/uL (ref 1.7–7.7)
Neutrophils Relative %: 63 %
Platelet Count: 284 10*3/uL (ref 150–400)
RBC: 3.82 MIL/uL — ABNORMAL LOW (ref 3.87–5.11)
RDW: 13.3 % (ref 11.5–15.5)
WBC Count: 4.5 10*3/uL (ref 4.0–10.5)
nRBC: 0 % (ref 0.0–0.2)

## 2021-09-07 LAB — IRON AND TIBC
Iron: 86 ug/dL (ref 41–142)
Saturation Ratios: 39 % (ref 21–57)
TIBC: 221 ug/dL — ABNORMAL LOW (ref 236–444)
UIBC: 135 ug/dL (ref 120–384)

## 2021-09-07 LAB — FERRITIN: Ferritin: 332 ng/mL — ABNORMAL HIGH (ref 11–307)

## 2021-09-07 LAB — RETIC PANEL
Immature Retic Fract: 9.9 % (ref 2.3–15.9)
RBC.: 3.83 MIL/uL — ABNORMAL LOW (ref 3.87–5.11)
Retic Count, Absolute: 27.6 10*3/uL (ref 19.0–186.0)
Retic Ct Pct: 0.7 % (ref 0.4–3.1)
Reticulocyte Hemoglobin: 32.8 pg (ref >27.9)

## 2021-09-07 NOTE — Progress Notes (Signed)
[place] Telephone:(336) [phone]   Fax:([phone]) [place] NOTE  Patient Care Team: [name], MD as PCP - General (Family Medicine) Ronald Lobo, MD as Consulting Physician (Gastroenterology) Clance, Armando Reichert, MD as Consulting Physician (Pulmonary Disease)  Hematological/Oncological History 1) 05/24/2021: Labs from PCP, Dr. [name] showed WBC 5.0, Hgb 8.7 (L), MCV 85.2, Plt 277, Ferritin 6.2 (L), Iron 24 (L), Transferrin 315, Iron saturation 6%, TIBC 441.   2)Admitted from 06/15/2021-06/17/2021 for multiple episodes of rectal bleeding. Hgb was 8.3, Fecal occult blood test was positive. Patient was taking Xarelto for history of DVT, last dose 06/14/2021. CT scan showed some diverticulosis with no active bleeding. Underwent EGD and colonoscopy with no source of bleeding. Felt to be diverticular bleeding exacerbated by use of anticoagulation. Xarelto was stopped and received 3 units of pRBC. Hgb was 11.8.  3) Readmitted from 06/20/2021-06/23/2021 for drop in Hgb from 11.8 to 10.7. Recommended to initiate ferrous sulfate 325 mg once daily and to follow up with PCP.   4) 07/06/2021: Establish care with Dede Query PA-C  5) 07/27/2021-08/27/2021: Received IV venofer x 5 doses   CHIEF COMPLAINTS/PURPOSE OF CONSULTATION:  "Iron deficiency anemia"  HISTORY OF PRESENTING ILLNESS:  Stephanie Barber 80 y.o. female returns to the clinic for follow-up for iron deficiency anemia.  Since the last visit on 07/06/2021, patient received IV Venofer x 5 doses.  On exam today, Stephanie Barber reports that her energy levels have improved after receiving IV iron infusions. She is able to complete her daily activities on her own.  Her appetite is fairly stable without any weight changes.  She continues to have chronic postprandial loose stools that has been present for several years.  She adds that her bowel habits are based on foods that she eats.  She adds that she has sensitivity  to dairy and has to limit that in her diet.  She does take Creon and other supplements to help regulate her bowel movements.  Patient adds that her stools are black but contributes that to taking oral iron tablets.  She denies any other signs of bleeding.  Patient denies any fevers, chills, night sweats, shortness of breath, chest pain or cough.  Has no other complaints.  Rest of the 10 point ROS is below.  MEDICAL HISTORY:  Past Medical History:  Diagnosis Date   Arthritis    Chronic headache    Complication of anesthesia    allergy to Novocaine    Diabetes mellitus    GERD (gastroesophageal reflux disease)    Heart murmur    High cholesterol    Hyperlipidemia    Hypertension    Intracranial atherosclerosis    per MRA   Migraine    Narcolepsy and cataplexy    OSA on CPAP    states has not used CPAP in over a year   Rectal prolapse    Trigger finger of all digits of right hand     SURGICAL HISTORY: Past Surgical History:  Procedure Laterality Date   ABDOMINAL HYSTERECTOMY     Fibroids   BACK SURGERY     L4,L5 discectomy   CARDIAC CATHETERIZATION     CARPAL TUNNEL RELEASE Right 01/18/2017   COLONOSCOPY WITH PROPOFOL N/A 06/17/2021   Procedure: COLONOSCOPY WITH PROPOFOL;  Surgeon: Wilford Corner, MD;  Location: [facility];  Service: Endoscopy;  Laterality: N/A;   ESOPHAGOGASTRODUODENOSCOPY (EGD) WITH PROPOFOL N/A 06/17/2021   Procedure: ESOPHAGOGASTRODUODENOSCOPY (EGD) WITH PROPOFOL;  Surgeon: Wilford Corner, MD;  Location: [facility];  Service: Endoscopy;  Laterality: N/A;   KIDNEY SURGERY  1998   Right.  growth removed   POLYPECTOMY  06/17/2021   Procedure: POLYPECTOMY;  Surgeon: Wilford Corner, MD;  Location: [facility];  Service: Endoscopy;;   rectal prolapse repair  12/05/2011   ROTATOR CUFF REPAIR     Left   SHOULDER ARTHROSCOPY WITH SUBACROMIAL DECOMPRESSION, ROTATOR CUFF REPAIR AND BICEP TENDON REPAIR Left 05/25/2015   Procedure: SHOULDER ARTHROSCOPY  WITH SUBACROMIAL DECOMPRESSION, ROTATOR CUFF REPAIR AND BICEP TENODESIS, DEBRIDEMENT. ;  Surgeon: Meredith Pel, MD;  Location: [place];  Service: Orthopedics;  Laterality: Left;  LEFT SHOULDER ROTATOR CUFF TEAR REPAIR, ARTHROSCOPY, DEBRIDEMENT, BICEPS TENODESIS, SUBACROMIAL DECOMPRESSION.   TOTAL ABDOMINAL HYSTERECTOMY     TRIGGER FINGER RELEASE Right 04/30/2018   Procedure: RELEASE TRIGGER FINGER/A-1 PULLEY RIGHT MIDDLE  AND SMALLn ;  Surgeon: Daryll Brod, MD;  Location: [place];  Service: Orthopedics;  Laterality: Right;  FAB, Bier block   TUBAL LIGATION      SOCIAL HISTORY: Social History   Socioeconomic History   Marital status: Married    Spouse name: Stephanie Barber   Number of children: 4   Years of education: 14   Highest education level: Not on file  Occupational History   Occupation: retired. prev worked at [employer].  Tobacco Use   Smoking status: Former    Types: Cigarettes    Quit date: 11/13/1980    Years since quitting: 40.8   Smokeless tobacco: Never   Tobacco comments:    quit in 1982  Substance and Sexual Activity   Alcohol use: No    Alcohol/week: 0.0 standard drinks   Drug use: No   Sexual activity: Yes  Other Topics Concern   Not on file  Social History Narrative   21 st January 2014 , patient underwent PS and MSLT - MSLT had one  SREMs and an average time to fall asleep of  *.8 minutes , her Ewort is 20 points,: facit: this patient has severe hypersomnia and is at risk when driving. medication in form ogf nuvigil smaples had been dispensed to her but she ha not yet taken it. 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 In detail -discussion of diagnosis and treatment  takes place today , first with nuvigil and if insufficient, with XYREM. Her CPAP treats her OSA very well, and she uses it 6  hours  or more each night,  residual AHI of 1.1  would not allow for OSA to be still explaining this degree of sleepiness. 2 downloads were reviewed. labs reviewed.    Patient is married Stephanie Barber) and lives at home with her husband and grandchild.   Patient has four children   Patient is retired.   Patient has a college education.   Patient is right-handed.   Patient does not drink any caffeine.      Social Determinants of Health   Financial Resource Strain: Not on file  Food Insecurity: Not on file  Transportation Needs: Not on file  Physical Activity: Not on file  Stress: Not on file  Social Connections: Not on file  Intimate Partner Violence: Not on file    FAMILY HISTORY: Family History  Problem Relation Age of Onset   Heart disease Mother    Throat cancer Father  Cancer Father        stomach   Kidney cancer Brother    Heart disease Brother     ALLERGIES:  is allergic to morphine and related.  MEDICATIONS:  Current Outpatient Medications  Medication Sig Dispense Refill   acetaminophen (TYLENOL) 500 MG tablet Take 500 mg by mouth every 6 (six) hours as needed for moderate pain.     amLODipine (NORVASC) 10 MG tablet Take 10 mg by mouth daily after breakfast.     celecoxib (CELEBREX) 200 MG capsule Take 200 mg by mouth daily.     Cinnamon 500 MG capsule Take 500 mg by mouth daily.     colestipol (COLESTID) 1 g tablet Take 2 g by mouth daily.     CREON 36000-114000 units CPEP capsule Take by mouth.     cyclobenzaprine (FLEXERIL) 10 MG tablet Take 5-10 mg by mouth at bedtime as needed.     diazepam (VALIUM) 2 MG tablet Take 1 mg by mouth at bedtime.     Ferrous Sulfate (IRON) 325 (65 Fe) MG TABS Take 1 tablet (325 mg total) by mouth daily. 30 tablet 0   fish oil-omega-3 fatty acids 1000 MG capsule Take 1 g by mouth daily.     gabapentin (NEURONTIN) 400 MG capsule Take 400 mg by mouth at bedtime.     hydrocortisone (ANUSOL-HC) 25 MG suppository Place 1 suppository (25 mg  total) rectally at bedtime. 12 suppository 0   loperamide (IMODIUM A-D) 2 MG tablet Take 2 mg by mouth 4 (four) times daily as needed for diarrhea or loose stools.     meclizine (ANTIVERT) 25 MG tablet Take 1 tablet (25 mg total) by mouth 3 (three) times daily as needed for dizziness. 30 tablet 0   metFORMIN (GLUCOPHAGE) 500 MG tablet Take 500 mg by mouth 2 (two) times daily with a meal.     methocarbamol (ROBAXIN) 500 MG tablet Take 1 tablet (500 mg total) by mouth every 8 (eight) hours as needed for muscle spasms. 10 tablet 0   metoprolol (TOPROL-XL) 50 MG 24 hr tablet Take 50 mg by mouth daily after breakfast.     Polyethyl Glycol-Propyl Glycol 0.4-0.3 % SOLN Place 1 drop into both eyes daily.     pravastatin (PRAVACHOL) 40 MG tablet Take 40 mg by mouth at bedtime.     sertraline (ZOLOFT) 25 MG tablet Take 25 mg by mouth at bedtime.     No current facility-administered medications for this visit.    REVIEW OF SYSTEMS:   Constitutional: ( - ) fevers, ( - )  chills , ( - ) night sweats Eyes: ( - ) blurriness of vision, ( - ) double vision, ( - ) watery eyes Ears, nose, mouth, throat, and face: ( - ) mucositis, ( - ) sore throat Respiratory: ( - ) cough, ( - ) dyspnea, ( - ) wheezes Cardiovascular: ( - ) palpitation, ( - ) chest discomfort, ( - ) lower extremity swelling Gastrointestinal:  ( - ) nausea, ( - ) heartburn, ( - ) change in bowel habits Skin: ( - ) abnormal skin rashes Lymphatics: ( - ) new lymphadenopathy, ( - ) easy bruising Neurological: ( - ) numbness, ( - ) tingling, ( - ) new weaknesses Behavioral/Psych: ( - ) mood change, ( - ) new changes  All other systems were reviewed with the patient and are negative.  PHYSICAL EXAMINATION: ECOG PERFORMANCE STATUS: 1 - Symptomatic but completely ambulatory  Vitals:   09/07/21  1433  BP: (!) 157/64  Pulse: 68  Resp: 17  Temp: 98.5 F (36.9 C)  SpO2: 100%   Filed Weights   09/07/21 1433  Weight: 108 lb 6.4 oz (49.2 kg)     GENERAL: well appearing female in NAD  SKIN: skin color, texture, turgor are normal, no rashes or significant lesions EYES: conjunctiva are pink and non-injected, sclera clear OROPHARYNX: no exudate, no erythema; lips, buccal mucosa, and tongue normal  LYMPH:  no palpable lymphadenopathy in the cervical or supraclavicular lymph nodes.  LUNGS: clear to auscultation and percussion with normal breathing effort HEART: regular rate & rhythm and no murmurs and no lower extremity edema ABDOMEN: soft, non-tender, non-distended, normal bowel sounds Musculoskeletal: no cyanosis of digits and no clubbing  PSYCH: alert & oriented x 3, fluent speech NEURO: no focal motor/sensory deficits  LABORATORY DATA:  I have reviewed the data as listed CBC Latest Ref Rng & Units 09/07/2021 07/06/2021 06/23/2021  WBC 4.0 - 10.5 K/uL 4.5 6.3 6.9  Hemoglobin 12.0 - 15.0 g/dL 11.3(L) 9.4(L) 9.3(L)  Hematocrit 36.0 - 46.0 % 35.5(L) 28.8(L) 28.8(L)  Platelets 150 - 400 K/uL 284 459(H) 175    CMP Latest Ref Rng & Units 07/06/2021 06/23/2021 06/20/2021  Glucose 70 - 99 mg/dL 103(H) 101(H) 101(H)  BUN 8 - 23 mg/dL 24(H) 35(H) 34(H)  Creatinine 0.44 - 1.00 mg/dL 1.30(H) 1.43(H) 1.56(H)  Sodium 135 - 145 mmol/L 142 140 138  Potassium 3.5 - 5.1 mmol/L 4.3 4.4 4.9  Chloride 98 - 111 mmol/L 111 108 105  CO2 22 - 32 mmol/L 22 24 25   Calcium 8.9 - 10.3 mg/dL 9.6 9.4 9.2  Total Protein 6.5 - 8.1 g/dL 7.0 - 6.9  Total Bilirubin 0.3 - 1.2 mg/dL 0.3 - 0.5  Alkaline Phos 38 - 126 U/L 53 - 51  AST 15 - 41 U/L 19 - 22  ALT 0 - 44 U/L 13 - 14    RADIOGRAPHIC STUDIES: I have personally reviewed the radiological images as listed and agreed with the findings in the report. DG Lumbar Spine Complete  Result Date: 08/10/2021 CLINICAL DATA:  Low back pain without sciatica. Pain worsening and beginning to radiate down into back of right leg EXAM: DG HIP (WITH OR WITHOUT PELVIS) 2-3V RIGHT; LUMBAR SPINE - COMPLETE 4+ VIEW  COMPARISON:  X-ray lumbar spine 05/25/2009, CT angiography abdomen pelvis 06/15/2021 FINDINGS: Five non-rib-bearing lumbar vertebral bodies are noted. Status post L4 laminectomy. Persistent grade 1 anterolisthesis of L3 on L4. Multilevel degenerative changes of the spine with intervertebral disc space narrowing and posterior disc osteophyte formation at the L4-L5 level. Osteophyte formation facet arthropathy is also noted. At least moderate osseous neural foraminal stenosis at the L4-L5 and L5-S1 level. No acute displaced lumbar fracture. There is no evidence of hip fracture or dislocation of the right hip. No acute displaced fracture or diastasis of the bones of the pelvis. Frontal view of the left hip is grossly unremarkable with no dislocation or acute displaced fracture. There is no evidence of arthropathy or other focal bone abnormality. Old healed sacral fracture. Surgical tacks and anastomotic sutures noted overlying the pelvis. Atherosclerotic plaque. Calcifications overlying the left renal shadow may represent nephrolithiasis, these measure up to approximately 6 mm. IMPRESSION: 1. No acute displaced fracture or traumatic listhesis of the lumbar spine in a patient status post L4 laminectomy. Degenerative changes as described above. 2. No acute displaced fracture or dislocation of the right hip. 3. No acute displaced fracture diastasis of  the pelvic bone. 4. Left nephrolithiasis measuring up to proximally 6 mm. Electronically Signed   By: [name] M.D.   On: 08/10/2021 23:54   MR Lumbar Spine w/o contrast  Result Date: 08/30/2021 CLINICAL DATA:  Low back pain, worsening, right lower extremity radiculopathy EXAM: MRI LUMBAR SPINE WITHOUT CONTRAST TECHNIQUE: Multiplanar, multisequence MR imaging of the lumbar spine was performed. No intravenous contrast was administered. COMPARISON:  04/19/2009 lumbar spine MRI FINDINGS: Segmentation:  Standard. Alignment:  Physiologic. Vertebrae: No acute osseous  abnormality. No fracture, evidence of discitis, or suspicious osseous lesion. Endplate degenerative changes most prominently at L4-L5. sequela prior L4-L5 discectomy. Conus medullaris and cauda equina: Conus extends to the L1 level. Conus and cauda equina appear normal. Paraspinal and other soft tissues: Multiple T2 hyperintense lesions in the kidneys, most likely renal cysts. Fatty atrophy of the inferior paraspinous muscles. Postoperative changes inferior back soft tissues. Disc levels: T12-L1: No significant disc bulge. No spinal canal stenosis or neural foraminal narrowing. L1-L2: No significant disc bulge. Mild facet arthropathy. No spinal canal stenosis or neural foraminal narrowing. L2-L3: Disc bulge with left greater than right foraminal protrusions. Moderate facet arthropathy with ligamentum flavum thickening. No spinal canal stenosis. Narrowing of the lateral recesses. No significant neural foraminal narrowing. L3-L4: Broad-based disc bulge. Moderate facet arthropathy. Ligamentum flavum hypertrophy. Mild spinal canal stenosis, which has progressed from the prior exam. Narrowing of the lateral recesses. Mild bilateral neural foraminal narrowing. L4-L5: Postsurgical changes. Status post severe disc height loss with disc osteophyte complex, which has progressed from prior exam, and broad-based disc bulge with superimposed right foraminal protrusion. Moderate facet arthropathy. Narrowing of the lateral recesses. No spinal canal stenosis. Moderate left and mild right neural foraminal narrowing. L5-S1: Broad-based disc bulge. Moderate facet arthropathy. No spinal canal stenosis. No neural foraminal narrowing. IMPRESSION: 1. L3-L4 mild spinal canal stenosis and mild bilateral neural foraminal narrowing. 2. L4-L5 moderate left and mild right neural foraminal narrowing. 3. Narrowing of the lateral recesses at L2-L3, L3-L4, and L4-L5 could impact the descending L3, L4, and L5 nerve roots, respectively.  Electronically Signed   By: Merilyn Baba M.D.   On: 08/30/2021 19:45   DG HIP UNILAT WITH PELVIS 2-3 VIEWS RIGHT  Result Date: 08/10/2021 CLINICAL DATA:  Low back pain without sciatica. Pain worsening and beginning to radiate down into back of right leg EXAM: DG HIP (WITH OR WITHOUT PELVIS) 2-3V RIGHT; LUMBAR SPINE - COMPLETE 4+ VIEW COMPARISON:  X-ray lumbar spine 05/25/2009, CT angiography abdomen pelvis 06/15/2021 FINDINGS: Five non-rib-bearing lumbar vertebral bodies are noted. Status post L4 laminectomy. Persistent grade 1 anterolisthesis of L3 on L4. Multilevel degenerative changes of the spine with intervertebral disc space narrowing and posterior disc osteophyte formation at the L4-L5 level. Osteophyte formation facet arthropathy is also noted. At least moderate osseous neural foraminal stenosis at the L4-L5 and L5-S1 level. No acute displaced lumbar fracture. There is no evidence of hip fracture or dislocation of the right hip. No acute displaced fracture or diastasis of the bones of the pelvis. Frontal view of the left hip is grossly unremarkable with no dislocation or acute displaced fracture. There is no evidence of arthropathy or other focal bone abnormality. Old healed sacral fracture. Surgical tacks and anastomotic sutures noted overlying the pelvis. Atherosclerotic plaque. Calcifications overlying the left renal shadow may represent nephrolithiasis, these measure up to approximately 6 mm. IMPRESSION: 1. No acute displaced fracture or traumatic listhesis of the lumbar spine in a patient status post L4 laminectomy. Degenerative  changes as described above. 2. No acute displaced fracture or dislocation of the right hip. 3. No acute displaced fracture diastasis of the pelvic bone. 4. Left nephrolithiasis measuring up to proximally 6 mm. Electronically Signed   By: [name] M.D.   On: 08/10/2021 23:54    ASSESSMENT & PLAN RUQAYA STRAUSS is a 80 y.o. female returns for a follow up for  iron deficiency anemia.   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 Most recent EGD and colonoscopy from 06/17/2021 with no signs of active bleeding.  --Recommend to continue to incorporate iron rich foods into diet.  --Received IV venofer x 5 doses from 07/27/2021-08/27/2021.  --Labs today shows improved of anemia with Hgb of 11.3, serum iron 89, iron saturation 39% and ferritin 332.  --Currently on ferrous sulfate 325 mg once daily. Recommend to continue. --RTC in 3 months with repeat labs.   #DVT of right femoral vein: --Diagnosed in 06/03/2019. CTA negative for PE. Started on Xarelto.  --Discontinued with recent hospitalization for GI bleed.   Orders Placed This Encounter  Procedures   CBC with Differential ([place] Only)    Standing Status:   Future    Standing Expiration Date:   09/07/2022   Ferritin    Standing Status:   Future    Standing Expiration Date:   09/07/2022   Iron and TIBC    Standing Status:   Future    Standing Expiration Date:   09/07/2022   Retic Panel    Standing Status:   Future    Standing Expiration Date:   09/07/2022     All questions were answered. The patient knows to call the clinic with any problems, questions or concerns.  I have spent a total of 25 minutes minutes of face-to-face and non-face-to-face time, preparing to see the patient, performing a medically appropriate examination, counseling and educating the patient, documenting clinical information in the electronic health record, and care coordination.    Dede Query, PA-C Department of Hematology/Oncology [place] at [facility] Phone: [phone]

## 2021-09-14 NOTE — Progress Notes (Signed)
Office Visit Note   Patient: Stephanie Barber           Date of Birth: Mar 22, 1941           MRN: [id number] Visit Date: 08/25/2021              Requested by: [name], MD [place],  [place] [id number] PCP: [name], MD   Assessment & Plan: Visit Diagnoses:  1. Acute right-sided low back pain with right-sided sciatica     Plan: With patient's ongoing symptoms that have failed conservative treatment I recommend getting a lumbar MRI to rule out HNP/stenosis.  Patient will follow-up with Dr. [name] in a few weeks to review the study and discuss treatment options.  Follow-Up Instructions: Return in about 3 weeks (around 09/15/2021) for WITH DR [name] TO REVIEW LUMBAR MRI SCAN.   Orders:  Orders Placed This Encounter  Procedures   XR Lumbar Spine 2-3 Views   MR Lumbar Spine w/o contrast   No orders of the defined types were placed in this encounter.     Procedures: No procedures performed   Clinical Data: No additional findings.   Subjective: Chief Complaint  Patient presents with   Lower Back - Pain    HPI 80 year old female comes in with complaints of worsening low back pain and right lower extremity radiculopathy.  Patient's had problems off and on for several years but this is been worse over the last couple months.  She has seen primary care provider Dr. Dierdre Barber for this pain and she had x-rays August 10, 2021 that showed:  CLINICAL DATA:  Low back pain without sciatica. Pain worsening and beginning to radiate down into back of right leg   EXAM: DG HIP (WITH OR WITHOUT PELVIS) 2-3V RIGHT; LUMBAR SPINE - COMPLETE 4+ VIEW   COMPARISON:  X-ray lumbar spine 05/25/2009, CT angiography abdomen pelvis 06/15/2021   FINDINGS: Five non-rib-bearing lumbar vertebral bodies are noted. Status post L4 laminectomy. Persistent grade 1 anterolisthesis of L3 on L4. Multilevel degenerative changes of the spine with intervertebral disc  space narrowing and posterior disc osteophyte formation at the L4-L5 level. Osteophyte formation facet arthropathy is also noted. At least moderate osseous neural foraminal stenosis at the L4-L5 and L5-S1 level. No acute displaced lumbar fracture.   There is no evidence of hip fracture or dislocation of the right hip. No acute displaced fracture or diastasis of the bones of the pelvis. Frontal view of the left hip is grossly unremarkable with no dislocation or acute displaced fracture. There is no evidence of arthropathy or other focal bone abnormality.   Old healed sacral fracture.   Surgical tacks and anastomotic sutures noted overlying the pelvis.   Atherosclerotic plaque. Calcifications overlying the left renal shadow may represent nephrolithiasis, these measure up to approximately 6 mm.   IMPRESSION: 1. No acute displaced fracture or traumatic listhesis of the lumbar spine in a patient status post L4 laminectomy. Degenerative changes as described above. 2. No acute displaced fracture or dislocation of the right hip. 3. No acute displaced fracture diastasis of the pelvic bone. 4. Left nephrolithiasis measuring up to proximally 6 mm.     Electronically Signed   By: [name] M.D.   On: 08/10/2021 23:54 Pain when she is ambulating, sitting, standing.  Describes some neurogenic claudication symptoms. Patient had recent hospital admission for GI bleed.      Objective: Vital Signs: BP (!) 169/69   Pulse 65  Ht 4\' 11"  (1.499 m)   Wt 107 lb 8 oz (48.8 kg)   BMI 21.71 kg/m   Physical Exam HENT:     Head: Normocephalic.  Eyes:     Extraocular Movements: Extraocular movements intact.  Pulmonary:     Effort: No respiratory distress.  Musculoskeletal:     Comments: Gait is somewhat antalgic.  Bilateral lumbar paraspinal tenderness.  Positive sciatic notch tenderness.  Negative logroll.  Negative straight leg raise.  No focal motor deficits.  Neurological:      General: No focal deficit present.     Mental Status: She is alert and oriented to person, place, and time.  Psychiatric:        Mood and Affect: Mood normal.    Ortho Exam  Specialty Comments:  No specialty comments available.  Imaging: No results found.   PMFS History: Patient Active Problem List   Diagnosis Date Noted   IDA (iron deficiency anemia) 07/06/2021   Protein-calorie malnutrition, severe 06/21/2021   Acute blood loss anemia    Malnutrition of moderate degree 06/16/2021   Hematochezia 06/15/2021   Acute GI bleeding 06/15/2021   Hyperlipidemia associated with type 2 diabetes mellitus ([place]) 06/15/2021   Left-sided headache 06/30/2019   Memory loss 06/30/2019   AKI (acute kidney injury) ([place]) 05/06/2017   Dehydration 05/06/2017   Chest pain 05/06/2017   Wound infection after surgery 01/30/2017   Palpitations 08/24/2016   Noncompliance with CPAP treatment 12/28/2015   Poor compliance with CPAP treatment 10/26/2014   CPAP use counseling 07/28/2014   OSA on CPAP 07/28/2014   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   Hypersomnolent 04/02/2014   Obstructive sleep apnea 04/02/2014   Non-compliance with treatment 03/05/2014   Type 2 diabetes mellitus ([place]) 12/05/2011   Hypertension associated with diabetes ([place]) 12/05/2011   GERD (gastroesophageal reflux disease)    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   Chronic cough 07/26/2011   Past Medical History:  Diagnosis Date   Arthritis    Chronic headache    Complication of anesthesia    allergy to Novocaine    Diabetes mellitus    GERD (gastroesophageal reflux disease)    Heart murmur    High cholesterol    Hyperlipidemia    Hypertension    Intracranial atherosclerosis    per MRA   Migraine    Narcolepsy and cataplexy    OSA on CPAP    states has not used CPAP in over a year   Rectal prolapse    Trigger finger of all digits of right hand     Family History  Problem Relation Age of Onset   Heart  disease Mother    Throat cancer Father    Cancer Father        stomach   Kidney cancer Brother    Heart disease Brother     Past Surgical History:  Procedure Laterality Date   ABDOMINAL HYSTERECTOMY     Fibroids   BACK SURGERY     L4,L5 discectomy   CARDIAC CATHETERIZATION     CARPAL TUNNEL RELEASE Right 01/18/2017   COLONOSCOPY WITH PROPOFOL N/A 06/17/2021   Procedure: COLONOSCOPY WITH PROPOFOL;  Surgeon: Wilford Corner, MD;  Location: [facility];  Service: Endoscopy;  Laterality: N/A;   ESOPHAGOGASTRODUODENOSCOPY (EGD) WITH PROPOFOL N/A 06/17/2021   Procedure: ESOPHAGOGASTRODUODENOSCOPY (EGD) WITH PROPOFOL;  Surgeon: Wilford Corner, MD;  Location: [facility];  Service: Endoscopy;  Laterality: N/A;   KIDNEY SURGERY  1998   Right.  growth removed  POLYPECTOMY  06/17/2021   Procedure: POLYPECTOMY;  Surgeon: Wilford Corner, MD;  Location: [facility];  Service: Endoscopy;;   rectal prolapse repair  12/05/2011   ROTATOR CUFF REPAIR     Left   SHOULDER ARTHROSCOPY WITH SUBACROMIAL DECOMPRESSION, ROTATOR CUFF REPAIR AND BICEP TENDON REPAIR Left 05/25/2015   Procedure: SHOULDER ARTHROSCOPY WITH SUBACROMIAL DECOMPRESSION, ROTATOR CUFF REPAIR AND BICEP TENODESIS, DEBRIDEMENT. ;  Surgeon: Meredith Pel, MD;  Location: [facility];  Service: Orthopedics;  Laterality: Left;  LEFT SHOULDER ROTATOR CUFF TEAR REPAIR, ARTHROSCOPY, DEBRIDEMENT, BICEPS TENODESIS, SUBACROMIAL DECOMPRESSION.   TOTAL ABDOMINAL HYSTERECTOMY     TRIGGER FINGER RELEASE Right 04/30/2018   Procedure: RELEASE TRIGGER FINGER/A-1 PULLEY RIGHT MIDDLE  AND SMALLn ;  Surgeon: Daryll Brod, MD;  Location: [facility];  Service: Orthopedics;  Laterality: Right;  FAB, Bier block   TUBAL LIGATION     Social History   Occupational History   Occupation: retired. prev worked at [employer].  Tobacco Use   Smoking status: Former    Types: Cigarettes    Quit date: 11/13/1980    Years since quitting:  40.8   Smokeless tobacco: Never   Tobacco comments:    quit in 1982  Substance and Sexual Activity   Alcohol use: No    Alcohol/week: 0.0 standard drinks   Drug use: No   Sexual activity: Yes

## 2021-09-16 ENCOUNTER — Ambulatory Visit (INDEPENDENT_AMBULATORY_CARE_PROVIDER_SITE_OTHER): Payer: Medicare Other | Admitting: Orthopaedic Surgery

## 2021-09-16 ENCOUNTER — Encounter: Payer: Self-pay | Admitting: Orthopaedic Surgery

## 2021-09-16 ENCOUNTER — Other Ambulatory Visit: Payer: Self-pay

## 2021-09-16 VITALS — BP 186/75 | HR 58 | Ht 59.0 in | Wt 106.0 lb

## 2021-09-16 DIAGNOSIS — M4726 Other spondylosis with radiculopathy, lumbar region: Secondary | ICD-10-CM

## 2021-09-16 DIAGNOSIS — M5441 Lumbago with sciatica, right side: Secondary | ICD-10-CM

## 2021-09-16 NOTE — Progress Notes (Signed)
Office Visit Note   Patient: Stephanie Barber           Date of Birth: 04/21/1941           MRN: [id number] Visit Date: 09/16/2021              Requested by: [name], MD [address],  [place] [id number] PCP: [name], MD   Assessment & Plan: Visit Diagnoses:  1. Acute right-sided low back pain with right-sided sciatica     Plan: We will set patient up for an epidural injection for persistent significant pain.  She can follow-up with me after the epidural.  Follow-Up Instructions: No follow-ups on file.   Orders:  Orders Placed This Encounter  Procedures   Ambulatory referral to Physical Medicine Rehab   No orders of the defined types were placed in this encounter.     Procedures: No procedures performed   Clinical Data: No additional findings.   Subjective: Chief Complaint  Patient presents with   Lower Back - Pain, Follow-up    MRI lumbar review    HPI 80 year old female returns with ongoing trouble with back pain and right leg pain.  States is gradually getting worse difficulty sleeping.  MRI 08/30/2021 showed L3-4 mild spinal stenosis with mild bilateral neural foraminal narrowing.  L4-5 shows moderate left and mild right foraminal narrowing.  Some lateral recess narrowing at L2-3, L3-4 and L4-5.  Patient states she has had to use her walker.  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  She did have problems with a GI bleed while she is on the Xarelto.  She has sleep apnea has been noncompliant with CPAP.  History of GERD.  She is off Xarelto.  Review of Systems all other systems noncontributory to HPI.   Objective: Vital Signs: BP (!) 186/75   Pulse (!) 58   Ht 4\' 11"  (1.499 m)   Wt 106 lb (48.1 kg)   BMI 21.41 kg/m   Physical Exam Constitutional:      Appearance: She is well-developed.  HENT:     Head: Normocephalic.     Right Ear: External ear normal.     Left Ear: External ear normal. There is no  impacted cerumen.  Eyes:     Pupils: Pupils are equal, round, and reactive to light.  Neck:     Thyroid: No thyromegaly.     Trachea: No tracheal deviation.  Cardiovascular:     Rate and Rhythm: Normal rate.  Pulmonary:     Effort: Pulmonary effort is normal.  Abdominal:     Palpations: Abdomen is soft.  Musculoskeletal:     Cervical back: No rigidity.  Skin:    General: Skin is warm and dry.  Neurological:     Mental Status: She is alert and oriented to person, place, and time.  Psychiatric:        Behavior: Behavior normal.    Ortho Exam patient has some sciatic notch tenderness on the right anterior tib EHL is intact she is amatory with a walker.  Pedal pulse palpable.  Good quad strength negative logroll hips.  Specialty Comments:  No specialty comments available.  Imaging: CLINICAL DATA:  Low back pain, worsening, right lower extremity radiculopathy   EXAM: MRI LUMBAR SPINE WITHOUT CONTRAST   TECHNIQUE: Multiplanar, multisequence MR imaging of the lumbar spine was performed. No intravenous contrast was administered.   COMPARISON:  04/19/2009 lumbar spine MRI  FINDINGS: Segmentation:  Standard.   Alignment:  Physiologic.   Vertebrae: No acute osseous abnormality. No fracture, evidence of discitis, or suspicious osseous lesion. Endplate degenerative changes most prominently at L4-L5. sequela prior L4-L5 discectomy.   Conus medullaris and cauda equina: Conus extends to the L1 level. Conus and cauda equina appear normal.   Paraspinal and other soft tissues: Multiple T2 hyperintense lesions in the kidneys, most likely renal cysts. Fatty atrophy of the inferior paraspinous muscles. Postoperative changes inferior back soft tissues.   Disc levels:   T12-L1: No significant disc bulge. No spinal canal stenosis or neural foraminal narrowing.   L1-L2: No significant disc bulge. Mild facet arthropathy. No spinal canal stenosis or neural foraminal narrowing.    L2-L3: Disc bulge with left greater than right foraminal protrusions. Moderate facet arthropathy with ligamentum flavum thickening. No spinal canal stenosis. Narrowing of the lateral recesses. No significant neural foraminal narrowing.   L3-L4: Broad-based disc bulge. Moderate facet arthropathy. Ligamentum flavum hypertrophy. Mild spinal canal stenosis, which has progressed from the prior exam. Narrowing of the lateral recesses. Mild bilateral neural foraminal narrowing.   L4-L5: Postsurgical changes. Status post severe disc height loss with disc osteophyte complex, which has progressed from prior exam, and broad-based disc bulge with superimposed right foraminal protrusion. Moderate facet arthropathy. Narrowing of the lateral recesses. No spinal canal stenosis. Moderate left and mild right neural foraminal narrowing.   L5-S1: Broad-based disc bulge. Moderate facet arthropathy. No spinal canal stenosis. No neural foraminal narrowing.   IMPRESSION: 1. L3-L4 mild spinal canal stenosis and mild bilateral neural foraminal narrowing. 2. L4-L5 moderate left and mild right neural foraminal narrowing. 3. Narrowing of the lateral recesses at L2-L3, L3-L4, and L4-L5 could impact the descending L3, L4, and L5 nerve roots, respectively.     Electronically Signed   By: Merilyn Baba M.D.   On: 08/30/2021 19:45   PMFS History: Patient Active Problem List   Diagnosis Date Noted   IDA (iron deficiency anemia) 07/06/2021   Protein-calorie malnutrition, severe 06/21/2021   Acute blood loss anemia    Malnutrition of moderate degree 06/16/2021   Hematochezia 06/15/2021   Acute GI bleeding 06/15/2021   Hyperlipidemia associated with type 2 diabetes mellitus ([place]) 06/15/2021   Left-sided headache 06/30/2019   Memory loss 06/30/2019   AKI (acute kidney injury) ([place]) 05/06/2017   Dehydration 05/06/2017   Chest pain 05/06/2017   Wound infection after surgery 01/30/2017   Palpitations  08/24/2016   Noncompliance with CPAP treatment 12/28/2015   Poor compliance with CPAP treatment 10/26/2014   CPAP use counseling 07/28/2014   OSA on CPAP 07/28/2014   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   Hypersomnolent 04/02/2014   Obstructive sleep apnea 04/02/2014   Non-compliance with treatment 03/05/2014   Type 2 diabetes mellitus ([place]) 12/05/2011   Hypertension associated with diabetes ([place]) 12/05/2011   GERD (gastroesophageal reflux disease)    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   Chronic cough 07/26/2011   Past Medical History:  Diagnosis Date   Arthritis    Chronic headache    Complication of anesthesia    allergy to Novocaine    Diabetes mellitus    GERD (gastroesophageal reflux disease)    Heart murmur    High cholesterol    Hyperlipidemia    Hypertension    Intracranial atherosclerosis    per MRA   Migraine    Narcolepsy and cataplexy    OSA on CPAP    states has not used CPAP in over a year  Rectal prolapse    Trigger finger of all digits of right hand     Family History  Problem Relation Age of Onset   Heart disease Mother    Throat cancer Father    Cancer Father        stomach   Kidney cancer Brother    Heart disease Brother     Past Surgical History:  Procedure Laterality Date   ABDOMINAL HYSTERECTOMY     Fibroids   BACK SURGERY     L4,L5 discectomy   CARDIAC CATHETERIZATION     CARPAL TUNNEL RELEASE Right 01/18/2017   COLONOSCOPY WITH PROPOFOL N/A 06/17/2021   Procedure: COLONOSCOPY WITH PROPOFOL;  Surgeon: Wilford Corner, MD;  [facility];  Service: Endoscopy;  Laterality: N/A;   ESOPHAGOGASTRODUODENOSCOPY (EGD) WITH PROPOFOL N/A 06/17/2021   Procedure: ESOPHAGOGASTRODUODENOSCOPY (EGD) WITH PROPOFOL;  Surgeon: Wilford Corner, MD;  [facility];  Service: Endoscopy;  Laterality: N/A;   KIDNEY SURGERY  1998   Right.  growth removed   POLYPECTOMY  06/17/2021   Procedure: POLYPECTOMY;  Surgeon: Wilford Corner, MD;  Location: [facility];  Service: Endoscopy;;   rectal prolapse repair  12/05/2011   ROTATOR CUFF REPAIR     Left   SHOULDER ARTHROSCOPY WITH SUBACROMIAL DECOMPRESSION, ROTATOR CUFF REPAIR AND BICEP TENDON REPAIR Left 05/25/2015   Procedure: SHOULDER ARTHROSCOPY WITH SUBACROMIAL DECOMPRESSION, ROTATOR CUFF REPAIR AND BICEP TENODESIS, DEBRIDEMENT. ;  Surgeon: Meredith Pel, MD;  Location: [facility];  Service: Orthopedics;  Laterality: Left;  LEFT SHOULDER ROTATOR CUFF TEAR REPAIR, ARTHROSCOPY, DEBRIDEMENT, BICEPS TENODESIS, SUBACROMIAL DECOMPRESSION.   TOTAL ABDOMINAL HYSTERECTOMY     TRIGGER FINGER RELEASE Right 04/30/2018   Procedure: RELEASE TRIGGER FINGER/A-1 PULLEY RIGHT MIDDLE  AND SMALLn ;  Surgeon: Daryll Brod, MD;  Location: [facility];  Service: Orthopedics;  Laterality: Right;  FAB, Bier block   TUBAL LIGATION     Social History   Occupational History   Occupation: retired. prev worked at [employer].  Tobacco Use   Smoking status: Former    Types: Cigarettes    Quit date: 11/13/1980    Years since quitting: 40.8   Smokeless tobacco: Never   Tobacco comments:    quit in 1982  Substance and Sexual Activity   Alcohol use: No    Alcohol/week: 0.0 standard drinks   Drug use: No   Sexual activity: Yes

## 2021-09-19 ENCOUNTER — Telehealth: Payer: Self-pay | Admitting: Physical Medicine and Rehabilitation

## 2021-09-19 DIAGNOSIS — M4726 Other spondylosis with radiculopathy, lumbar region: Secondary | ICD-10-CM | POA: Insufficient documentation

## 2021-09-19 NOTE — Telephone Encounter (Signed)
Patient states hip pain is increasing. Wanted to check status of injection referral

## 2021-09-21 ENCOUNTER — Telehealth: Payer: Self-pay

## 2021-09-21 ENCOUNTER — Other Ambulatory Visit: Payer: Self-pay | Admitting: Orthopaedic Surgery

## 2021-09-21 NOTE — Telephone Encounter (Signed)
I will add her to the wait list, but we do not have anything before then. We opened up that morning to try to get more people in before the holiday.

## 2021-09-21 NOTE — Telephone Encounter (Signed)
Could you please advise? Patient would like to know what she can do for pain until appointment with Dr. Ernestina Patches. Loma Sousa has put her on wait list. Appointment is scheduled for 10/04/2021.

## 2021-09-21 NOTE — Telephone Encounter (Signed)
noted 

## 2021-09-21 NOTE — Telephone Encounter (Signed)
Patient called stated she wanted to see if Dr Ernestina Patches could get her in sooner than 11/22 for an appointment and if not what could she do to help with the pain in the meantime?

## 2021-09-22 DIAGNOSIS — R42 Dizziness and giddiness: Secondary | ICD-10-CM | POA: Diagnosis not present

## 2021-09-22 DIAGNOSIS — R030 Elevated blood-pressure reading, without diagnosis of hypertension: Secondary | ICD-10-CM | POA: Diagnosis not present

## 2021-09-22 DIAGNOSIS — D649 Anemia, unspecified: Secondary | ICD-10-CM | POA: Diagnosis not present

## 2021-09-23 ENCOUNTER — Encounter (HOSPITAL_COMMUNITY): Payer: Self-pay | Admitting: Emergency Medicine

## 2021-09-23 ENCOUNTER — Emergency Department (HOSPITAL_COMMUNITY)
Admission: EM | Admit: 2021-09-23 | Discharge: 2021-09-23 | Disposition: A | Discharge status: Home / Self Care | Payer: Medicare Other | Attending: Emergency Medicine | Admitting: Emergency Medicine

## 2021-09-23 ENCOUNTER — Other Ambulatory Visit: Payer: Self-pay

## 2021-09-23 DIAGNOSIS — M79604 Pain in right leg: Secondary | ICD-10-CM | POA: Diagnosis not present

## 2021-09-23 DIAGNOSIS — Z7984 Long term (current) use of oral hypoglycemic drugs: Secondary | ICD-10-CM | POA: Insufficient documentation

## 2021-09-23 DIAGNOSIS — M5441 Lumbago with sciatica, right side: Secondary | ICD-10-CM | POA: Diagnosis not present

## 2021-09-23 DIAGNOSIS — Z87891 Personal history of nicotine dependence: Secondary | ICD-10-CM | POA: Diagnosis not present

## 2021-09-23 DIAGNOSIS — Z79899 Other long term (current) drug therapy: Secondary | ICD-10-CM | POA: Insufficient documentation

## 2021-09-23 DIAGNOSIS — I10 Essential (primary) hypertension: Secondary | ICD-10-CM | POA: Diagnosis not present

## 2021-09-23 DIAGNOSIS — E119 Type 2 diabetes mellitus without complications: Secondary | ICD-10-CM | POA: Insufficient documentation

## 2021-09-23 DIAGNOSIS — M5431 Sciatica, right side: Secondary | ICD-10-CM | POA: Insufficient documentation

## 2021-09-23 DIAGNOSIS — R030 Elevated blood-pressure reading, without diagnosis of hypertension: Secondary | ICD-10-CM | POA: Diagnosis not present

## 2021-09-23 DIAGNOSIS — D649 Anemia, unspecified: Secondary | ICD-10-CM | POA: Diagnosis not present

## 2021-09-23 HISTORY — DX: Gastrointestinal hemorrhage, unspecified: K92.2

## 2021-09-23 MED ORDER — METHYLPREDNISOLONE SODIUM SUCC 125 MG IJ SOLR
125.0000 mg | Freq: Once | INTRAMUSCULAR | Status: AC
  Administered 2021-09-23: 125 mg via INTRAMUSCULAR
  Filled 2021-09-23: qty 2

## 2021-09-23 MED ORDER — KETOROLAC TROMETHAMINE 60 MG/2ML IM SOLN
60.0000 mg | Freq: Once | INTRAMUSCULAR | Status: AC
  Administered 2021-09-23: 60 mg via INTRAMUSCULAR
  Filled 2021-09-23: qty 2

## 2021-09-23 NOTE — Discharge Instructions (Signed)
Take Hydrocodone one tablet every 6 hours.  Call your Physician on Monday to discuss pain management

## 2021-09-23 NOTE — ED Triage Notes (Signed)
Patient complains of R leg pain, had a fall about 1.5 months ago, complains of R leg and R hip pain. Husband states that she is waiting on a shot for pain because she has bulging disc. Hydrocodone w/o relief.

## 2021-09-23 NOTE — ED Provider Notes (Signed)
[facility] DEPT Provider Note   CSN: [id number] Arrival date & time: 09/23/21  1626     History Chief Complaint  Patient presents with   Leg Pain   Hip Pain    Stephanie Barber is a 80 y.o. female.  Pt complains of pain in her right leg and her low back.  Pt is scheduled for an epidural steroid injection on 11/22.  [name] reports he tried to get earlier but no openings. Pt taking hydrocodone twice a day.  Pt has had an MRi   The history is provided by the patient. No language interpreter was used.  Leg Pain Location:  Leg Time since incident:  1 month Injury: no   Leg location:  R leg Pain details:    Quality:  Aching   Radiates to:  Does not radiate   Severity:  Moderate   Onset quality:  Gradual   Duration:  1 month   Timing:  Constant   Progression:  Worsening Chronicity:  Chronic Associated symptoms: back pain   Hip Pain      Past Medical History:  Diagnosis Date   Arthritis    Chronic headache    Complication of anesthesia    allergy to Novocaine    Diabetes mellitus    GERD (gastroesophageal reflux disease)    GI bleed    Heart murmur    High cholesterol    Hyperlipidemia    Hypertension    Intracranial atherosclerosis    per MRA   Migraine    Narcolepsy and cataplexy    OSA on CPAP    states has not used CPAP in over a year   Rectal prolapse    Trigger finger of all digits of right hand     Patient Active Problem List   Diagnosis Date Noted   Other spondylosis with radiculopathy, lumbar region 09/19/2021   IDA (iron deficiency anemia) 07/06/2021   Protein-calorie malnutrition, severe 06/21/2021   Acute blood loss anemia    Malnutrition of moderate degree 06/16/2021   Hematochezia 06/15/2021   Acute GI bleeding 06/15/2021   Hyperlipidemia associated with type 2 diabetes mellitus ([place]) 06/15/2021   Left-sided headache 06/30/2019   Memory loss 06/30/2019   AKI (acute kidney injury) ([place]) 05/06/2017    Dehydration 05/06/2017   Chest pain 05/06/2017   Wound infection after surgery 01/30/2017   Palpitations 08/24/2016   Noncompliance with CPAP treatment 12/28/2015   Poor compliance with CPAP treatment 10/26/2014   CPAP use counseling 07/28/2014   OSA on CPAP 07/28/2014   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   Hypersomnolent 04/02/2014   Obstructive sleep apnea 04/02/2014   Non-compliance with treatment 03/05/2014   Type 2 diabetes mellitus ([place]) 12/05/2011   Hypertension associated with diabetes ([place]) 12/05/2011   GERD (gastroesophageal reflux disease)    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   Chronic cough 07/26/2011    Past Surgical History:  Procedure Laterality Date   ABDOMINAL HYSTERECTOMY     Fibroids   BACK SURGERY     L4,L5 discectomy   CARDIAC CATHETERIZATION     CARPAL TUNNEL RELEASE Right 01/18/2017   COLONOSCOPY WITH PROPOFOL N/A 06/17/2021   Procedure: COLONOSCOPY WITH PROPOFOL;  Surgeon: Wilford Corner, MD;  Location: [facility];  Service: Endoscopy;  Laterality: N/A;   ESOPHAGOGASTRODUODENOSCOPY (EGD) WITH PROPOFOL N/A 06/17/2021   Procedure: ESOPHAGOGASTRODUODENOSCOPY (EGD) WITH PROPOFOL;  Surgeon: Wilford Corner, MD;  [facility]: [facility];  Service: Endoscopy;  Laterality: N/A;   KIDNEY SURGERY  1998   Right.  growth removed   POLYPECTOMY  06/17/2021   Procedure: POLYPECTOMY;  Surgeon: Wilford Corner, MD;  [facility];  Service: Endoscopy;;   rectal prolapse repair  12/05/2011   ROTATOR CUFF REPAIR     Left   SHOULDER ARTHROSCOPY WITH SUBACROMIAL DECOMPRESSION, ROTATOR CUFF REPAIR AND BICEP TENDON REPAIR Left 05/25/2015   Procedure: SHOULDER ARTHROSCOPY WITH SUBACROMIAL DECOMPRESSION, ROTATOR CUFF REPAIR AND BICEP TENODESIS, DEBRIDEMENT. ;  Surgeon: Meredith Pel, MD;  Location: [facility];  Service: Orthopedics;  Laterality: Left;  LEFT SHOULDER ROTATOR CUFF TEAR REPAIR, ARTHROSCOPY, DEBRIDEMENT, BICEPS TENODESIS, SUBACROMIAL  DECOMPRESSION.   TOTAL ABDOMINAL HYSTERECTOMY     TRIGGER FINGER RELEASE Right 04/30/2018   Procedure: RELEASE TRIGGER FINGER/A-1 PULLEY RIGHT MIDDLE  AND SMALLn ;  Surgeon: Daryll Brod, MD;  Location: [facility];  Service: Orthopedics;  Laterality: Right;  FAB, Bier block   TUBAL LIGATION       OB History   No obstetric history on file.     Family History  Problem Relation Age of Onset   Heart disease Mother    Throat cancer Father    Cancer Father        stomach   Kidney cancer Brother    Heart disease Brother     Social History   Tobacco Use   Smoking status: Former    Types: Cigarettes    Quit date: 11/13/1980    Years since quitting: 40.8   Smokeless tobacco: Never   Tobacco comments:    quit in 1982  Substance Use Topics   Alcohol use: No    Alcohol/week: 0.0 standard drinks   Drug use: No    Home Medications Prior to Admission medications   Medication Sig Start Date End Date Taking? Authorizing Provider  acetaminophen (TYLENOL) 500 MG tablet Take 500 mg by mouth every 6 (six) hours as needed for moderate pain.    [provider]  amLODipine (NORVASC) 10 MG tablet Take 10 mg by mouth daily after breakfast.    [provider]  celecoxib (CELEBREX) 200 MG capsule Take 200 mg by mouth daily.    [provider]  Cinnamon 500 MG capsule Take 500 mg by mouth daily.    [provider]  colestipol (COLESTID) 1 g tablet Take 2 g by mouth daily. 07/11/21   [provider]  CREON 22979-892119 units CPEP capsule Take by mouth. 07/12/21   [provider]  cyclobenzaprine (FLEXERIL) 10 MG tablet Take 5-10 mg by mouth at bedtime as needed. 06/27/21   [provider]  diazepam (VALIUM) 2 MG tablet Take 1 mg by mouth at bedtime. 02/28/21   [provider]  Ferrous Sulfate (IRON) 325 (65 Fe) MG TABS Take 1 tablet (325 mg total) by mouth daily. 06/23/21   [name], DO  fish oil-omega-3 fatty  acids 1000 MG capsule Take 1 g by mouth daily.    [provider]  gabapentin (NEURONTIN) 400 MG capsule Take 400 mg by mouth at bedtime.    [provider]  hydrocortisone (ANUSOL-HC) 25 MG suppository Place 1 suppository (25 mg total) rectally at bedtime. 06/23/21   [name], DO  loperamide (IMODIUM A-D) 2 MG tablet Take 2 mg by mouth 4 (four) times daily as needed for diarrhea or loose stools.    [provider]  meclizine (ANTIVERT) 25 MG tablet Take 1 tablet (25 mg total) by mouth 3 (three) times daily as needed  for dizziness. 06/23/21   [name], DO  metFORMIN (GLUCOPHAGE) 500 MG tablet Take 500 mg by mouth 2 (two) times daily with a meal.    [provider]  methocarbamol (ROBAXIN) 500 MG tablet Take 1 tablet (500 mg total) by mouth every 8 (eight) hours as needed for muscle spasms. 06/23/21   [name], DO  metoprolol (TOPROL-XL) 50 MG 24 hr tablet Take 50 mg by mouth daily after breakfast.    [provider]  Polyethyl Glycol-Propyl Glycol 0.4-0.3 % SOLN Place 1 drop into both eyes daily.    [provider]  pravastatin (PRAVACHOL) 40 MG tablet Take 40 mg by mouth at bedtime.    [provider]  sertraline (ZOLOFT) 25 MG tablet Take 25 mg by mouth at bedtime. 02/28/21   [provider]    Allergies    Morphine and related  Review of Systems   Review of Systems  Musculoskeletal:  Positive for back pain.  All other systems reviewed and are negative.  Physical Exam Updated Vital Signs BP (!) 158/67 (BP Location: Left Arm)   Pulse 72   Temp 98.6 F (37 C) (Oral)   Resp 16   Ht 4\' 11"  (1.499 m)   Wt 48 kg   SpO2 98%   BMI 21.37 kg/m   Physical Exam Vitals and nursing note reviewed.  Constitutional:      Appearance: She is well-developed.  HENT:     Head: Normocephalic.  Cardiovascular:     Rate and Rhythm: Normal rate and regular rhythm.  Pulmonary:     Effort: Pulmonary effort is  normal.  Abdominal:     General: There is no distension.  Musculoskeletal:        General: Normal range of motion.     Cervical back: Normal range of motion.  Skin:    General: Skin is warm.  Neurological:     General: No focal deficit present.     Mental Status: She is alert and oriented to person, place, and time.    ED Results / Procedures / Treatments   Labs (all labs ordered are listed, but only abnormal results are displayed) Labs Reviewed - No data to display  EKG None  Radiology No results found.  Procedures Procedures   Medications Ordered in ED Medications - No data to display  ED Course  I have reviewed the triage vital signs and the nursing notes.  Pertinent labs & imaging results that were available during my care of the patient were reviewed by me and considered in my medical decision making (see chart for details).    MDM Rules/Calculators/A&P                           MDM:  Pt given toradol and solumedrol  IM  I advised call primary MD to discuss pain mangement on Monday Pt advised she can take one hydrocdone every 6 hours instead of every 4  OTc lidocaine patches  Final Clinical Impression(s) / ED Diagnoses Final diagnoses:  Right leg pain  Sciatica of right side    Rx / DC Orders ED Discharge Orders     None        Sidney Ace 09/23/21 2006    Lajean Saver, MD 09/26/21 1627

## 2021-10-03 ENCOUNTER — Telehealth: Payer: Self-pay | Admitting: Physical Medicine and Rehabilitation

## 2021-10-03 NOTE — Telephone Encounter (Signed)
Patient called needing to cancel her appointment and reschedule. Patient is leaving down this afternoon.   The number to contact patient is [phone]

## 2021-10-04 ENCOUNTER — Ambulatory Visit: Payer: Medicare Other | Admitting: Physical Medicine and Rehabilitation

## 2021-10-15 NOTE — Progress Notes (Signed)
Chart reviewed, agree above plan ?

## 2021-10-18 DIAGNOSIS — I10 Essential (primary) hypertension: Secondary | ICD-10-CM | POA: Diagnosis not present

## 2021-10-18 DIAGNOSIS — E7800 Pure hypercholesterolemia, unspecified: Secondary | ICD-10-CM | POA: Diagnosis not present

## 2021-10-18 DIAGNOSIS — M545 Low back pain, unspecified: Secondary | ICD-10-CM | POA: Diagnosis not present

## 2021-10-18 DIAGNOSIS — G5600 Carpal tunnel syndrome, unspecified upper limb: Secondary | ICD-10-CM | POA: Diagnosis not present

## 2021-10-18 DIAGNOSIS — E1169 Type 2 diabetes mellitus with other specified complication: Secondary | ICD-10-CM | POA: Diagnosis not present

## 2021-10-18 DIAGNOSIS — N1832 Chronic kidney disease, stage 3b: Secondary | ICD-10-CM | POA: Diagnosis not present

## 2021-10-18 DIAGNOSIS — R202 Paresthesia of skin: Secondary | ICD-10-CM | POA: Diagnosis not present

## 2021-10-18 DIAGNOSIS — E46 Unspecified protein-calorie malnutrition: Secondary | ICD-10-CM | POA: Diagnosis not present

## 2021-10-18 DIAGNOSIS — D509 Iron deficiency anemia, unspecified: Secondary | ICD-10-CM | POA: Diagnosis not present

## 2021-10-24 ENCOUNTER — Ambulatory Visit: Payer: Medicare Other | Admitting: Physical Medicine and Rehabilitation

## 2021-10-24 ENCOUNTER — Telehealth: Payer: Self-pay | Admitting: Physical Medicine and Rehabilitation

## 2021-10-24 NOTE — Telephone Encounter (Signed)
Pt missed her appt and would like to get resch'd for an inj. The best call back number is [phone].

## 2021-10-25 ENCOUNTER — Other Ambulatory Visit: Payer: Self-pay | Admitting: Family Medicine

## 2021-10-25 DIAGNOSIS — E2839 Other primary ovarian failure: Secondary | ICD-10-CM

## 2021-11-01 ENCOUNTER — Ambulatory Visit: Payer: Medicare Other | Admitting: Orthopaedic Surgery

## 2021-11-24 DIAGNOSIS — D509 Iron deficiency anemia, unspecified: Secondary | ICD-10-CM | POA: Diagnosis not present

## 2021-11-24 DIAGNOSIS — K219 Gastro-esophageal reflux disease without esophagitis: Secondary | ICD-10-CM | POA: Diagnosis not present

## 2021-11-24 DIAGNOSIS — K8681 Exocrine pancreatic insufficiency: Secondary | ICD-10-CM | POA: Diagnosis not present

## 2021-11-30 DIAGNOSIS — I10 Essential (primary) hypertension: Secondary | ICD-10-CM | POA: Diagnosis not present

## 2021-11-30 DIAGNOSIS — G629 Polyneuropathy, unspecified: Secondary | ICD-10-CM | POA: Diagnosis not present

## 2021-11-30 DIAGNOSIS — E1149 Type 2 diabetes mellitus with other diabetic neurological complication: Secondary | ICD-10-CM | POA: Diagnosis not present

## 2021-11-30 DIAGNOSIS — E7800 Pure hypercholesterolemia, unspecified: Secondary | ICD-10-CM | POA: Diagnosis not present

## 2021-12-07 ENCOUNTER — Inpatient Hospital Stay (HOSPITAL_BASED_OUTPATIENT_CLINIC_OR_DEPARTMENT_OTHER): Payer: Medicare Other | Admitting: Physician Assistant

## 2021-12-07 ENCOUNTER — Other Ambulatory Visit: Payer: Self-pay

## 2021-12-07 ENCOUNTER — Inpatient Hospital Stay: Payer: Medicare Other | Attending: Physician Assistant | Admitting: Physician Assistant

## 2021-12-07 VITALS — BP 143/65 | HR 66 | Temp 97.3°F | Resp 16 | Wt 106.0 lb

## 2021-12-07 DIAGNOSIS — Z634 Disappearance and death of family member: Secondary | ICD-10-CM | POA: Insufficient documentation

## 2021-12-07 DIAGNOSIS — Z86711 Personal history of pulmonary embolism: Secondary | ICD-10-CM

## 2021-12-07 DIAGNOSIS — D500 Iron deficiency anemia secondary to blood loss (chronic): Secondary | ICD-10-CM | POA: Insufficient documentation

## 2021-12-07 DIAGNOSIS — Z79899 Other long term (current) drug therapy: Secondary | ICD-10-CM | POA: Insufficient documentation

## 2021-12-07 DIAGNOSIS — D508 Other iron deficiency anemias: Secondary | ICD-10-CM

## 2021-12-07 DIAGNOSIS — R630 Anorexia: Secondary | ICD-10-CM | POA: Diagnosis not present

## 2021-12-07 DIAGNOSIS — Z7901 Long term (current) use of anticoagulants: Secondary | ICD-10-CM

## 2021-12-07 DIAGNOSIS — K922 Gastrointestinal hemorrhage, unspecified: Secondary | ICD-10-CM | POA: Diagnosis not present

## 2021-12-07 DIAGNOSIS — Z86718 Personal history of other venous thrombosis and embolism: Secondary | ICD-10-CM | POA: Diagnosis not present

## 2021-12-07 DIAGNOSIS — Z87891 Personal history of nicotine dependence: Secondary | ICD-10-CM | POA: Insufficient documentation

## 2021-12-07 LAB — CBC WITH DIFFERENTIAL (CANCER CENTER ONLY)
Abs Immature Granulocytes: 0.01 10*3/uL (ref 0.00–0.07)
Basophils Absolute: 0 10*3/uL (ref 0.0–0.1)
Basophils Relative: 1 %
Eosinophils Absolute: 0.1 10*3/uL (ref 0.0–0.5)
Eosinophils Relative: 3 %
HCT: 37.5 % (ref 36.0–46.0)
Hemoglobin: 12 g/dL (ref 12.0–15.0)
Immature Granulocytes: 0 %
Lymphocytes Relative: 38 %
Lymphs Abs: 1.6 10*3/uL (ref 0.7–4.0)
MCH: 29.2 pg (ref 26.0–34.0)
MCHC: 32 g/dL (ref 30.0–36.0)
MCV: 91.2 fL (ref 80.0–100.0)
Monocytes Absolute: 0.4 10*3/uL (ref 0.1–1.0)
Monocytes Relative: 9 %
Neutro Abs: 2.1 10*3/uL (ref 1.7–7.7)
Neutrophils Relative %: 49 %
Platelet Count: 334 10*3/uL (ref 150–400)
RBC: 4.11 MIL/uL (ref 3.87–5.11)
RDW: 12.3 % (ref 11.5–15.5)
WBC Count: 4.3 10*3/uL (ref 4.0–10.5)
nRBC: 0 % (ref 0.0–0.2)

## 2021-12-07 LAB — RETIC PANEL
Immature Retic Fract: 6 % (ref 2.3–15.9)
RBC.: 4.03 MIL/uL (ref 3.87–5.11)
Retic Count, Absolute: 25 10*3/uL (ref 19.0–186.0)
Retic Ct Pct: 0.6 % (ref 0.4–3.1)
Reticulocyte Hemoglobin: 33 pg (ref >27.9)

## 2021-12-08 DIAGNOSIS — E441 Mild protein-calorie malnutrition: Secondary | ICD-10-CM | POA: Diagnosis not present

## 2021-12-08 LAB — FERRITIN: Ferritin: 366 ng/mL — ABNORMAL HIGH (ref 11–307)

## 2021-12-08 NOTE — Progress Notes (Signed)
Patient called and labs discussed.  Appointments made for 6 months. Verbalized understanding.

## 2021-12-08 NOTE — Progress Notes (Signed)
[facility] [phone]   Fax:([phone]  PROGRESS NOTE  Patient Care Team: [name], MD as PCP - General (Family Medicine) Ronald Lobo, MD as Consulting Physician (Gastroenterology) Clance, Armando Reichert, MD as Consulting Physician (Pulmonary Disease)  Hematological/Oncological History 1) 05/24/2021: Labs from PCP, Dr. [name] showed WBC 5.0, Hgb 8.7 (L), MCV 85.2, Plt 277, Ferritin 6.2 (L), Iron 24 (L), Transferrin 315, Iron saturation 6%, TIBC 441.   2)Admitted from 06/15/2021-06/17/2021 for multiple episodes of rectal bleeding. Hgb was 8.3, Fecal occult blood test was positive. Patient was taking Xarelto for history of DVT, last dose 06/14/2021. CT scan showed some diverticulosis with no active bleeding. Underwent EGD and colonoscopy with no source of bleeding. Felt to be diverticular bleeding exacerbated by use of anticoagulation. Xarelto was stopped and received 3 units of pRBC. Hgb was 11.8.  3) Readmitted from 06/20/2021-06/23/2021 for drop in Hgb from 11.8 to 10.7. Recommended to initiate ferrous sulfate 325 mg once daily and to follow up with PCP.   4) 07/06/2021: Establish care with Dede Query PA-C  5) 07/27/2021-08/27/2021: Received IV venofer x 5 doses   CHIEF COMPLAINTS/PURPOSE OF CONSULTATION:  "Iron deficiency anemia"  HISTORY OF PRESENTING ILLNESS:  Stephanie Barber 81 y.o. female returns to the clinic for follow-up for iron deficiency anemia.  She was last seen on 09/07/2021. She denies any changes to her health since then. Stephanie Barber shared that her older sister recently passed away. She has been grieving and lost her appetite. She is more lethargic and resting more frequently. She denies any nausea, vomiting or abdominal pain.  She denies any other signs of bleeding.  Patient denies any fevers, chills, night sweats, shortness of breath, chest pain or cough.  Has no other complaints.  Rest of the 10 point ROS is below.  MEDICAL  HISTORY:  Past Medical History:  Diagnosis Date   Arthritis    Chronic headache    Complication of anesthesia    allergy to Novocaine    Diabetes mellitus    GERD (gastroesophageal reflux disease)    GI bleed    Heart murmur    High cholesterol    Hyperlipidemia    Hypertension    Intracranial atherosclerosis    per MRA   Migraine    Narcolepsy and cataplexy    OSA on CPAP    states has not used CPAP in over a year   Rectal prolapse    Trigger finger of all digits of right hand     SURGICAL HISTORY: Past Surgical History:  Procedure Laterality Date   ABDOMINAL HYSTERECTOMY     Fibroids   BACK SURGERY     L4,L5 discectomy   CARDIAC CATHETERIZATION     CARPAL TUNNEL RELEASE Right 01/18/2017   COLONOSCOPY WITH PROPOFOL N/A 06/17/2021   Procedure: COLONOSCOPY WITH PROPOFOL;  Surgeon: Wilford Corner, MD;  [facility];  Service: Endoscopy;  Laterality: N/A;   ESOPHAGOGASTRODUODENOSCOPY (EGD) WITH PROPOFOL N/A 06/17/2021   Procedure: ESOPHAGOGASTRODUODENOSCOPY (EGD) WITH PROPOFOL;  Surgeon: Wilford Corner, MD;  [facility];  Service: Endoscopy;  Laterality: N/A;   KIDNEY SURGERY  1998   Right.  growth removed   POLYPECTOMY  06/17/2021   Procedure: POLYPECTOMY;  Surgeon: Wilford Corner, MD;  [facility];  Service: Endoscopy;;   rectal prolapse repair  12/05/2011   ROTATOR CUFF REPAIR     Left   SHOULDER ARTHROSCOPY WITH SUBACROMIAL DECOMPRESSION, ROTATOR CUFF REPAIR AND BICEP TENDON REPAIR Left 05/25/2015  Procedure: SHOULDER ARTHROSCOPY WITH SUBACROMIAL DECOMPRESSION, ROTATOR CUFF REPAIR AND BICEP TENODESIS, DEBRIDEMENT. ;  Surgeon: Meredith Pel, MD;  Location: [facility];  Service: Orthopedics;  Laterality: Left;  LEFT SHOULDER ROTATOR CUFF TEAR REPAIR, ARTHROSCOPY, DEBRIDEMENT, BICEPS TENODESIS, SUBACROMIAL DECOMPRESSION.   TOTAL ABDOMINAL HYSTERECTOMY     TRIGGER FINGER RELEASE Right 04/30/2018   Procedure: RELEASE TRIGGER  FINGER/A-1 PULLEY RIGHT MIDDLE  AND SMALLn ;  Surgeon: Daryll Brod, MD;  Location: [facility];  Service: Orthopedics;  Laterality: Right;  FAB, Bier block   TUBAL LIGATION      SOCIAL HISTORY: Social History   Socioeconomic History   Marital status: Married    Spouse name: [name]   Number of children: 4   Years of education: 14   Highest education level: Not on file  Occupational History   Occupation: retired. prev worked at [employer].  Tobacco Use   Smoking status: Former    Types: Cigarettes    Quit date: 11/13/1980    Years since quitting: 41.1   Smokeless tobacco: Never   Tobacco comments:    quit in 1982  Substance and Sexual Activity   Alcohol use: No    Alcohol/week: 0.0 standard drinks   Drug use: No   Sexual activity: Yes  Other Topics Concern   Not on file  Social History Narrative   21 st January 2014 , patient underwent PS and MSLT - MSLT had one  SREMs and an average time to fall asleep of  *.8 minutes , her Ewort is 20 points,: facit: this patient has severe hypersomnia and is at risk when driving. medication in form ogf nuvigil smaples had been dispensed to her but she ha not yet taken it. 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 In detail -discussion of diagnosis and treatment  takes place today , first with nuvigil and if insufficient, with XYREM. Her CPAP treats her OSA very well, and she uses it 6 hours  or more each night,  residual AHI of 1.1  would not allow for OSA to be still explaining this degree of sleepiness. 2 downloads were reviewed. labs reviewed.    Patient is married [name]) and lives at home with her husband and grandchild.   Patient has four children   Patient is retired.   Patient has a college education.   Patient is right-handed.   Patient does not drink any caffeine.       Social Determinants of Health   Financial Resource Strain: Not on file  Food Insecurity: Not on file  Transportation Needs: Not on file  Physical Activity: Not on file  Stress: Not on file  Social Connections: Not on file  Intimate Partner Violence: Not on file    FAMILY HISTORY: Family History  Problem Relation Age of Onset   Heart disease Mother    Throat cancer Father    Cancer Father        stomach   Kidney cancer Brother    Heart disease Brother     ALLERGIES:  is allergic to morphine and related.  MEDICATIONS:  Current Outpatient Medications  Medication Sig Dispense Refill   acetaminophen (TYLENOL) 500 MG tablet Take 500 mg by mouth every 6 (six) hours as needed for moderate pain.     amLODipine (NORVASC) 10 MG tablet  Take 10 mg by mouth daily after breakfast.     celecoxib (CELEBREX) 200 MG capsule Take 200 mg by mouth daily.     Cinnamon 500 MG capsule Take 500 mg by mouth daily.     colestipol (COLESTID) 1 g tablet Take 2 g by mouth daily.     CREON 36000-114000 units CPEP capsule Take by mouth.     cyclobenzaprine (FLEXERIL) 10 MG tablet Take 5-10 mg by mouth at bedtime as needed.     diazepam (VALIUM) 2 MG tablet Take 1 mg by mouth at bedtime.     Ferrous Sulfate (IRON) 325 (65 Fe) MG TABS Take 1 tablet (325 mg total) by mouth daily. 30 tablet 0   fish oil-omega-3 fatty acids 1000 MG capsule Take 1 g by mouth daily.     gabapentin (NEURONTIN) 400 MG capsule Take 400 mg by mouth at bedtime.     hydrocortisone (ANUSOL-HC) 25 MG suppository Place 1 suppository (25 mg total) rectally at bedtime. 12 suppository 0   loperamide (IMODIUM A-D) 2 MG tablet Take 2 mg by mouth 4 (four) times daily as needed for diarrhea or loose stools.     meclizine (ANTIVERT) 25 MG tablet Take 1 tablet (25 mg total) by mouth 3 (three) times daily as needed for dizziness. 30 tablet 0   metFORMIN (GLUCOPHAGE) 500 MG tablet Take 500 mg by mouth 2 (two) times daily with a meal.      methocarbamol (ROBAXIN) 500 MG tablet Take 1 tablet (500 mg total) by mouth every 8 (eight) hours as needed for muscle spasms. 10 tablet 0   metoprolol (TOPROL-XL) 50 MG 24 hr tablet Take 50 mg by mouth daily after breakfast.     Polyethyl Glycol-Propyl Glycol 0.4-0.3 % SOLN Place 1 drop into both eyes daily.     pravastatin (PRAVACHOL) 40 MG tablet Take 40 mg by mouth at bedtime.     sertraline (ZOLOFT) 25 MG tablet Take 25 mg by mouth at bedtime.     No current facility-administered medications for this visit.    REVIEW OF SYSTEMS:   Constitutional: ( - ) fevers, ( - )  chills , ( - ) night sweats Eyes: ( - ) blurriness of vision, ( - ) double vision, ( - ) watery eyes Ears, nose, mouth, throat, and face: ( - ) mucositis, ( - ) sore throat Respiratory: ( - ) cough, ( - ) dyspnea, ( - ) wheezes Cardiovascular: ( - ) palpitation, ( - ) chest discomfort, ( - ) lower extremity swelling Gastrointestinal:  ( - ) nausea, ( - ) heartburn, ( - ) change in bowel habits Skin: ( - ) abnormal skin rashes Lymphatics: ( - ) new lymphadenopathy, ( - ) easy bruising Neurological: ( - ) numbness, ( - ) tingling, ( - ) new weaknesses Behavioral/Psych: ( - ) mood change, ( - ) new changes  All other systems were reviewed with the patient and are negative.  PHYSICAL EXAMINATION: ECOG PERFORMANCE STATUS: 1 - Symptomatic but completely ambulatory  Vitals:   12/07/21 1511  BP: (!) 143/65  Pulse: 66  Resp: 16  Temp: (!) 97.3 F (36.3 C)  SpO2: 100%   Filed Weights   12/07/21 1511  Weight: 106 lb (48.1 kg)    GENERAL: well appearing female in NAD  SKIN: skin color, texture, turgor are normal, no rashes or significant lesions EYES: conjunctiva are pink and non-injected, sclera clear OROPHARYNX: no exudate, no erythema; lips, buccal mucosa, and  tongue normal  LYMPH:  no palpable lymphadenopathy in the cervical or supraclavicular lymph nodes.  LUNGS: clear to auscultation and percussion with normal  breathing effort HEART: regular rate & rhythm and no murmurs and no lower extremity edema ABDOMEN: soft, non-tender, non-distended, normal bowel sounds Musculoskeletal: no cyanosis of digits and no clubbing  PSYCH: alert & oriented x 3, fluent speech NEURO: no focal motor/sensory deficits  LABORATORY DATA:  I have reviewed the data as listed CBC Latest Ref Rng & Units 12/07/2021 09/07/2021 07/06/2021  WBC 4.0 - 10.5 K/uL 4.3 4.5 6.3  Hemoglobin 12.0 - 15.0 g/dL 12.0 11.3(L) 9.4(L)  Hematocrit 36.0 - 46.0 % 37.5 35.5(L) 28.8(L)  Platelets 150 - 400 K/uL 334 284 459(H)    CMP Latest Ref Rng & Units 07/06/2021 06/23/2021 06/20/2021  Glucose 70 - 99 mg/dL 103(H) 101(H) 101(H)  BUN 8 - 23 mg/dL 24(H) 35(H) 34(H)  Creatinine 0.44 - 1.00 mg/dL 1.30(H) 1.43(H) 1.56(H)  Sodium 135 - 145 mmol/L 142 140 138  Potassium 3.5 - 5.1 mmol/L 4.3 4.4 4.9  Chloride 98 - 111 mmol/L 111 108 105  CO2 22 - 32 mmol/L 22 24 25   Calcium 8.9 - 10.3 mg/dL 9.6 9.4 9.2  Total Protein 6.5 - 8.1 g/dL 7.0 - 6.9  Total Bilirubin 0.3 - 1.2 mg/dL 0.3 - 0.5  Alkaline Phos 38 - 126 U/L 53 - 51  AST 15 - 41 U/L 19 - 22  ALT 0 - 44 U/L 13 - 14    RADIOGRAPHIC STUDIES: I have personally reviewed the radiological images as listed and agreed with the findings in the report. No results found.  ASSESSMENT & PLAN NADALYN DERINGER is a 81 y.o. female returns for a follow up for iron deficiency anemia.   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 Most recent EGD and colonoscopy from 06/17/2021 with no signs of active bleeding.  --Recommend to continue to incorporate iron rich foods into diet.  --Received IV venofer x 5 doses from 07/27/2021-08/27/2021.  --Labs today shows anemia has resolved with Hgb of 12.0. Ferritin level is 366.  --Currently on ferrous sulfate 325 mg once daily. Okay to continue.  --RTC in 6 months with repeat labs.   #DVT of right femoral  vein: --Diagnosed in 06/03/2019. CTA negative for PE. Started on Xarelto.  --Discontinued with during  hospitalization for GI bleed.   #Appetite loss/lethargy: --Likely secondary to grieving of patient's sister --Encouraged patient to keep appt with her PCP tomorrow to rule out other causes.   No orders of the defined types were placed in this encounter.    All questions were answered. The patient knows to call the clinic with any problems, questions or concerns.  I have spent a total of 25 minutes minutes of face-to-face and non-face-to-face time, preparing to see the patient, performing a medically appropriate examination, counseling and educating the patient, documenting clinical information in the electronic health record, and care coordination.    Dede Query, PA-C Department of Hematology/Oncology [place] at [facility] Phone: [phone]

## 2021-12-09 ENCOUNTER — Encounter: Payer: Self-pay | Admitting: Physician Assistant

## 2021-12-11 ENCOUNTER — Encounter: Payer: Self-pay | Admitting: Physician Assistant

## 2022-01-09 DIAGNOSIS — E46 Unspecified protein-calorie malnutrition: Secondary | ICD-10-CM | POA: Diagnosis not present

## 2022-01-10 DIAGNOSIS — Z6821 Body mass index (BMI) 21.0-21.9, adult: Secondary | ICD-10-CM | POA: Diagnosis not present

## 2022-01-10 DIAGNOSIS — E1169 Type 2 diabetes mellitus with other specified complication: Secondary | ICD-10-CM | POA: Diagnosis not present

## 2022-01-10 DIAGNOSIS — I10 Essential (primary) hypertension: Secondary | ICD-10-CM | POA: Diagnosis not present

## 2022-01-10 DIAGNOSIS — E1149 Type 2 diabetes mellitus with other diabetic neurological complication: Secondary | ICD-10-CM | POA: Diagnosis not present

## 2022-01-10 DIAGNOSIS — F5101 Primary insomnia: Secondary | ICD-10-CM | POA: Diagnosis not present

## 2022-02-15 DIAGNOSIS — H26493 Other secondary cataract, bilateral: Secondary | ICD-10-CM | POA: Diagnosis not present

## 2022-02-15 DIAGNOSIS — E119 Type 2 diabetes mellitus without complications: Secondary | ICD-10-CM | POA: Diagnosis not present

## 2022-02-15 DIAGNOSIS — Z961 Presence of intraocular lens: Secondary | ICD-10-CM | POA: Diagnosis not present

## 2022-02-15 DIAGNOSIS — H0288B Meibomian gland dysfunction left eye, upper and lower eyelids: Secondary | ICD-10-CM | POA: Diagnosis not present

## 2022-02-15 DIAGNOSIS — H1045 Other chronic allergic conjunctivitis: Secondary | ICD-10-CM | POA: Diagnosis not present

## 2022-02-15 DIAGNOSIS — H0288A Meibomian gland dysfunction right eye, upper and lower eyelids: Secondary | ICD-10-CM | POA: Diagnosis not present

## 2022-03-20 DIAGNOSIS — M180 Bilateral primary osteoarthritis of first carpometacarpal joints: Secondary | ICD-10-CM | POA: Diagnosis not present

## 2022-03-20 DIAGNOSIS — M79641 Pain in right hand: Secondary | ICD-10-CM | POA: Diagnosis not present

## 2022-03-20 DIAGNOSIS — M79642 Pain in left hand: Secondary | ICD-10-CM | POA: Diagnosis not present

## 2022-03-27 DIAGNOSIS — M180 Bilateral primary osteoarthritis of first carpometacarpal joints: Secondary | ICD-10-CM | POA: Diagnosis not present

## 2022-03-27 DIAGNOSIS — M79641 Pain in right hand: Secondary | ICD-10-CM | POA: Diagnosis not present

## 2022-03-27 DIAGNOSIS — M79642 Pain in left hand: Secondary | ICD-10-CM | POA: Diagnosis not present

## 2022-04-07 ENCOUNTER — Other Ambulatory Visit: Payer: Self-pay

## 2022-04-07 ENCOUNTER — Emergency Department (HOSPITAL_COMMUNITY)
Admission: EM | Admit: 2022-04-07 | Discharge: 2022-04-07 | Disposition: A | Discharge status: Home / Self Care | Payer: Medicare Other | Attending: Emergency Medicine | Admitting: Emergency Medicine

## 2022-04-07 ENCOUNTER — Encounter (HOSPITAL_COMMUNITY): Payer: Self-pay

## 2022-04-07 DIAGNOSIS — Z86718 Personal history of other venous thrombosis and embolism: Secondary | ICD-10-CM | POA: Diagnosis not present

## 2022-04-07 DIAGNOSIS — E119 Type 2 diabetes mellitus without complications: Secondary | ICD-10-CM | POA: Diagnosis not present

## 2022-04-07 DIAGNOSIS — Z79899 Other long term (current) drug therapy: Secondary | ICD-10-CM | POA: Diagnosis not present

## 2022-04-07 DIAGNOSIS — I10 Essential (primary) hypertension: Secondary | ICD-10-CM | POA: Insufficient documentation

## 2022-04-07 DIAGNOSIS — K625 Hemorrhage of anus and rectum: Secondary | ICD-10-CM | POA: Insufficient documentation

## 2022-04-07 DIAGNOSIS — R0981 Nasal congestion: Secondary | ICD-10-CM | POA: Diagnosis not present

## 2022-04-07 DIAGNOSIS — Z7901 Long term (current) use of anticoagulants: Secondary | ICD-10-CM | POA: Diagnosis not present

## 2022-04-07 LAB — COMPREHENSIVE METABOLIC PANEL
ALT: 12 U/L (ref 0–44)
AST: 18 U/L (ref 15–41)
Albumin: 4.2 g/dL (ref 3.5–5.0)
Alkaline Phosphatase: 53 U/L (ref 38–126)
Anion gap: 4 — ABNORMAL LOW (ref 5–15)
BUN: 26 mg/dL — ABNORMAL HIGH (ref 8–23)
CO2: 22 mmol/L (ref 22–32)
Calcium: 9.1 mg/dL (ref 8.9–10.3)
Chloride: 114 mmol/L — ABNORMAL HIGH (ref 98–111)
Creatinine, Ser: 1.67 mg/dL — ABNORMAL HIGH (ref 0.44–1.00)
GFR, Estimated: 30 mL/min — ABNORMAL LOW (ref >60)
Glucose, Bld: 95 mg/dL (ref 70–99)
Potassium: 4.7 mmol/L (ref 3.5–5.1)
Sodium: 140 mmol/L (ref 135–145)
Total Bilirubin: 0.5 mg/dL (ref 0.3–1.2)
Total Protein: 7.6 g/dL (ref 6.5–8.1)

## 2022-04-07 LAB — CBC
HCT: 35.2 % — ABNORMAL LOW (ref 36.0–46.0)
Hemoglobin: 11.4 g/dL — ABNORMAL LOW (ref 12.0–15.0)
MCH: 30.5 pg (ref 26.0–34.0)
MCHC: 32.4 g/dL (ref 30.0–36.0)
MCV: 94.1 fL (ref 80.0–100.0)
Platelets: 239 10*3/uL (ref 150–400)
RBC: 3.74 MIL/uL — ABNORMAL LOW (ref 3.87–5.11)
RDW: 12.8 % (ref 11.5–15.5)
WBC: 5 10*3/uL (ref 4.0–10.5)
nRBC: 0 % (ref 0.0–0.2)

## 2022-04-07 LAB — TYPE AND SCREEN
ABO/RH(D): O POS
Antibody Screen: NEGATIVE

## 2022-04-07 LAB — POC OCCULT BLOOD, ED: Fecal Occult Bld: POSITIVE — AB

## 2022-04-07 NOTE — Discharge Instructions (Signed)
Today stick with a clear liquid diet till tomorrow and if there is no more bleeding you can go back to your regular diet.  If you have anymore blood stools like you did last night you need to return to the ER immediately

## 2022-04-07 NOTE — ED Provider Notes (Signed)
[facility] DEPT Provider Note   CSN: [id number] Arrival date & time: 04/07/22  1405     History  Chief Complaint  Patient presents with   Blood In Stools   Nasal Congestion   Fatigue    Stephanie Barber is a 81 y.o. female.   Pt is a 81y/o female with hx of hypertension, diabetes mellitus type 2, chronic disease, DVT initially on xarelto but due to GI bleeding in 8/22 was d/ced and underwent EGD and colonoscopy which showed diverticulosis and hemorrhoids.  Pt has been doing well at home.  After GI bleeding episode in August that she did follow-up with hematology and after receiving blood in the hospital did receive an iron transfusion but followed up with them in January 2023 and at that time had a stable hemoglobin of 12.  Patient reports she has been in her normal state of health and then at 2 AM this morning she woke up and had a large bowel movement and reports there was a significant amount of bright red blood in her stool.  That occurred at 2 AM and then around 7 AM she went to urinate and noticed there was a little blood dripping out of her rectum.  Since that time she has had no further bowel movements and has not had any further bleeding however today she reports she just kind of feels tired and not as energetic as usual.  She does report starting iron in the last few weeks and noticing her stool has been dark since starting that medication.  Patient has not been on anticoagulation since August.  She denies any abdominal pain.  No shortness of breath with exertion or chest pain today.  No near syncope.  The history is provided by the patient.      Home Medications Prior to Admission medications   Medication Sig Start Date End Date Taking? Authorizing Provider  acetaminophen (TYLENOL) 500 MG tablet Take 500 mg by mouth every 6 (six) hours as needed for moderate pain.   Yes [provider]  Cinnamon 500 MG capsule Take 500 mg by mouth  daily.   Yes [provider]  donepezil (ARICEPT) 10 MG tablet Take 10 mg by mouth at bedtime. 03/22/22  Yes [provider]  Ferrous Sulfate (IRON) 325 (65 Fe) MG TABS Take 1 tablet (325 mg total) by mouth daily. 06/23/21  Yes Vann, Jessica U, DO  losartan (COZAAR) 100 MG tablet Take 100 mg by mouth every morning. 11/30/21  Yes [provider]  Polyethyl Glycol-Propyl Glycol 0.4-0.3 % SOLN Place 1 drop into both eyes daily.   Yes [provider]  pravastatin (PRAVACHOL) 40 MG tablet Take 40 mg by mouth at bedtime.   Yes [provider]  hydrocortisone (ANUSOL-HC) 25 MG suppository Place 1 suppository (25 mg total) rectally at bedtime. Patient not taking: Reported on 04/07/2022 06/23/21   [name], DO  meclizine (ANTIVERT) 25 MG tablet Take 1 tablet (25 mg total) by mouth 3 (three) times daily as needed for dizziness. Patient not taking: Reported on 04/07/2022 06/23/21   [name], DO  methocarbamol (ROBAXIN) 500 MG tablet Take 1 tablet (500 mg total) by mouth every 8 (eight) hours as needed for muscle spasms. Patient not taking: Reported on 04/07/2022 06/23/21   [name], DO      Allergies    Morphine and related    Review of Systems   Review of Systems  Physical Exam  Updated Vital Signs BP (!) 162/70   Pulse 63   Temp 98.1 F (36.7 C) (Oral)   Resp 15   Ht '4\' 11"'$  (1.499 m)   Wt 50.8 kg   SpO2 100%   BMI 22.62 kg/m  Physical Exam Vitals and nursing note reviewed.  Constitutional:      General: She is not in acute distress.    Appearance: She is well-developed.  HENT:     Head: Normocephalic and atraumatic.  Eyes:     Conjunctiva/sclera: Conjunctivae normal.     Pupils: Pupils are equal, round, and reactive to light.  Cardiovascular:     Rate and Rhythm: Normal rate and regular rhythm.     Heart sounds: No murmur heard. Pulmonary:     Effort: Pulmonary effort is normal. No respiratory distress.     Breath sounds:  Normal breath sounds. No wheezing or rales.  Abdominal:     General: There is no distension.     Palpations: Abdomen is soft.     Tenderness: There is no guarding or rebound.     Comments: Minimal suprapubic tenderness  Genitourinary:    Comments: Rectum appears normal without obvious external hemorrhoids.  On digital rectal exam there is mild streaks of bright red blood in stool appears dark Musculoskeletal:        General: No tenderness. Normal range of motion.     Cervical back: Normal range of motion and neck supple.  Skin:    General: Skin is warm and dry.     Findings: No erythema or rash.  Neurological:     Mental Status: She is alert and oriented to person, place, and time. Mental status is at baseline.  Psychiatric:        Mood and Affect: Mood normal.        Behavior: Behavior normal.    ED Results / Procedures / Treatments   Labs (all labs ordered are listed, but only abnormal results are displayed) Labs Reviewed  COMPREHENSIVE METABOLIC PANEL - Abnormal; Notable for the following components:      Result Value   Chloride 114 (*)    BUN 26 (*)    Creatinine, Ser 1.67 (*)    GFR, Estimated 30 (*)    Anion gap 4 (*)    All other components within normal limits  CBC - Abnormal; Notable for the following components:   RBC 3.74 (*)    Hemoglobin 11.4 (*)    HCT 35.2 (*)    All other components within normal limits  POC OCCULT BLOOD, ED - Abnormal; Notable for the following components:   Fecal Occult Bld POSITIVE (*)    All other components within normal limits  POC OCCULT BLOOD, ED  TYPE AND SCREEN    EKG None  Radiology No results found.  Procedures Procedures    Medications Ordered in ED Medications - No data to display  ED Course/ Medical Decision Making/ A&P                           Medical Decision Making Amount and/or Complexity of Data Reviewed External Data Reviewed: notes.    Details: gi and prior hospitalization Labs: ordered.  Decision-making details documented in ED Course.   Pt with multiple medical problems and comorbidities and presenting today with a complaint that caries a high risk for morbidity and mortality.  Presenting today with rectal bleeding.  She reports a large episode at 2  AM this morning when she had a large bowel movement.  Then she noticed some blood dripping from her rectum this morning when she went to urinate but has had none since.  She has had no further bowel movements.  Patient does have a significant history of GI bleeding in the past which was thought to be exacerbated by Xarelto and most likely from a diverticular bleed.  Possibility for that today but she also has a history of internal hemorrhoids.  Patient is well-appearing on exam with reassuring vital signs.  Will check hemoglobin as she followed up with hematology and last hemoglobin was 12 in January.  Pt follows with [facility].  4:38 PM I independently interpreted pt's labs and hemoccult positive and CBC with minimal change of Hb to 11.4 from 12 in January.  CMP with unchanged BUN and Cr.  Will discuss with [facility] for further management.  4:39 PM Spoke with Dr. Watt Climes from [facility] and he felt that patient if reasonable to go home and return if she has any recurrent episodes of bleeding where it would also be reasonable to observe him overnight.  Patient is not taking any anti-inflammatories aspirin or Xarelto at this time.  Findings discussed with the patient and her husband and informed decision making with the family and they choose to go home.  They report they live close to the hospital if she has any further episodes of bloody stool she will return immediately.  Because she is no longer on any anticoagulants feel that this is reasonable and she is well-appearing.  Did encourage them to follow-up with GI next week as well due to the recurrent bleed.  Also she will do a clear liquid diet for the rest of this evening but then return to her  normal diet tomorrow if she has no further bleeding.           Final Clinical Impression(s) / ED Diagnoses Final diagnoses:  Rectal bleeding    Rx / DC Orders ED Discharge Orders     None         Blanchie Dessert, MD 04/07/22 1641

## 2022-04-07 NOTE — ED Triage Notes (Signed)
Patient states she had a black stool , but had a "drink bottle amount " of bright red blood in color at 0200 today. Patient states she does take iron pills. Patient denies any abdominal or rectal pain.  Patient c/o feeling tired.

## 2022-04-08 ENCOUNTER — Other Ambulatory Visit: Payer: Self-pay

## 2022-04-08 ENCOUNTER — Encounter (HOSPITAL_COMMUNITY): Payer: Self-pay

## 2022-04-08 ENCOUNTER — Emergency Department (HOSPITAL_COMMUNITY)
Admission: EM | Admit: 2022-04-08 | Discharge: 2022-04-08 | Disposition: A | Discharge status: Home / Self Care | Payer: Medicare Other | Attending: Emergency Medicine | Admitting: Emergency Medicine

## 2022-04-08 DIAGNOSIS — I10 Essential (primary) hypertension: Secondary | ICD-10-CM | POA: Diagnosis not present

## 2022-04-08 DIAGNOSIS — K625 Hemorrhage of anus and rectum: Secondary | ICD-10-CM | POA: Diagnosis not present

## 2022-04-08 DIAGNOSIS — D649 Anemia, unspecified: Secondary | ICD-10-CM | POA: Diagnosis not present

## 2022-04-08 DIAGNOSIS — Z79899 Other long term (current) drug therapy: Secondary | ICD-10-CM | POA: Diagnosis not present

## 2022-04-08 DIAGNOSIS — E119 Type 2 diabetes mellitus without complications: Secondary | ICD-10-CM | POA: Diagnosis not present

## 2022-04-08 DIAGNOSIS — R5383 Other fatigue: Secondary | ICD-10-CM | POA: Insufficient documentation

## 2022-04-08 DIAGNOSIS — R7989 Other specified abnormal findings of blood chemistry: Secondary | ICD-10-CM | POA: Insufficient documentation

## 2022-04-08 LAB — URINALYSIS, ROUTINE W REFLEX MICROSCOPIC
Bacteria, UA: NONE SEEN
Bilirubin Urine: NEGATIVE
Glucose, UA: NEGATIVE mg/dL
Hgb urine dipstick: NEGATIVE
Ketones, ur: NEGATIVE mg/dL
Nitrite: NEGATIVE
Protein, ur: NEGATIVE mg/dL
Specific Gravity, Urine: 1.016 (ref 1.005–1.030)
pH: 5 (ref 5.0–8.0)

## 2022-04-08 LAB — CBC WITH DIFFERENTIAL/PLATELET
Abs Immature Granulocytes: 0.01 10*3/uL (ref 0.00–0.07)
Basophils Absolute: 0 10*3/uL (ref 0.0–0.1)
Basophils Relative: 1 %
Eosinophils Absolute: 0.2 10*3/uL (ref 0.0–0.5)
Eosinophils Relative: 4 %
HCT: 34.3 % — ABNORMAL LOW (ref 36.0–46.0)
Hemoglobin: 11.3 g/dL — ABNORMAL LOW (ref 12.0–15.0)
Immature Granulocytes: 0 %
Lymphocytes Relative: 25 %
Lymphs Abs: 1.3 10*3/uL (ref 0.7–4.0)
MCH: 30.4 pg (ref 26.0–34.0)
MCHC: 32.9 g/dL (ref 30.0–36.0)
MCV: 92.2 fL (ref 80.0–100.0)
Monocytes Absolute: 0.4 10*3/uL (ref 0.1–1.0)
Monocytes Relative: 9 %
Neutro Abs: 3.1 10*3/uL (ref 1.7–7.7)
Neutrophils Relative %: 61 %
Platelets: 241 10*3/uL (ref 150–400)
RBC: 3.72 MIL/uL — ABNORMAL LOW (ref 3.87–5.11)
RDW: 12.8 % (ref 11.5–15.5)
WBC: 5 10*3/uL (ref 4.0–10.5)
nRBC: 0 % (ref 0.0–0.2)

## 2022-04-08 LAB — COMPREHENSIVE METABOLIC PANEL
ALT: 12 U/L (ref 0–44)
AST: 22 U/L (ref 15–41)
Albumin: 4.1 g/dL (ref 3.5–5.0)
Alkaline Phosphatase: 49 U/L (ref 38–126)
Anion gap: 6 (ref 5–15)
BUN: 24 mg/dL — ABNORMAL HIGH (ref 8–23)
CO2: 20 mmol/L — ABNORMAL LOW (ref 22–32)
Calcium: 9.5 mg/dL (ref 8.9–10.3)
Chloride: 113 mmol/L — ABNORMAL HIGH (ref 98–111)
Creatinine, Ser: 1.66 mg/dL — ABNORMAL HIGH (ref 0.44–1.00)
GFR, Estimated: 31 mL/min — ABNORMAL LOW (ref >60)
Glucose, Bld: 104 mg/dL — ABNORMAL HIGH (ref 70–99)
Potassium: 4.2 mmol/L (ref 3.5–5.1)
Sodium: 139 mmol/L (ref 135–145)
Total Bilirubin: 0.6 mg/dL (ref 0.3–1.2)
Total Protein: 7.6 g/dL (ref 6.5–8.1)

## 2022-04-08 LAB — TYPE AND SCREEN
ABO/RH(D): O POS
Antibody Screen: NEGATIVE

## 2022-04-08 LAB — POC OCCULT BLOOD, ED: Fecal Occult Bld: POSITIVE — AB

## 2022-04-08 LAB — LIPASE, BLOOD: Lipase: 33 U/L (ref 11–51)

## 2022-04-08 NOTE — ED Provider Notes (Signed)
[facility] DEPT Provider Note   CSN: [id number] Arrival date & time: 04/08/22  1349     History Chief Complaint  Patient presents with   Rectal Bleeding    Stephanie Barber is a 81 y.o. female with h/o arthritis, GI bleed, hyperlipidemia, hypertension, diabetes presents the emergency department for evaluation of rectal bleeding.  Patient was seen in the emergency department yesterday and declined admission.  She was told to come back if she had a repeat episode of rectal bleeding.  Patient reports that she had clear liquid diet last night, however this morning she ate some grits as well as sausage and a piece of toast.  She reports that around 1300 today, she had a small episode of dark stool with blood.  She still denies any nausea or vomiting.  Denies any fever.  She reports that she has felt fatigued over the past few weeks.  She mentions that she had approximately 1 to 2 seconds of left lower quadrant abdominal pain earlier, but has not had none since.   Rectal Bleeding Associated symptoms: no abdominal pain, no fever, no light-headedness and no vomiting       Home Medications Prior to Admission medications   Medication Sig Start Date End Date Taking? Authorizing Provider  acetaminophen (TYLENOL) 500 MG tablet Take 500 mg by mouth every 6 (six) hours as needed for moderate pain.    [provider]  Cinnamon 500 MG capsule Take 500 mg by mouth daily.    [provider]  donepezil (ARICEPT) 10 MG tablet Take 10 mg by mouth at bedtime. 03/22/22   [provider]  Ferrous Sulfate (IRON) 325 (65 Fe) MG TABS Take 1 tablet (325 mg total) by mouth daily. 06/23/21   [name], DO  hydrocortisone (ANUSOL-HC) 25 MG suppository Place 1 suppository (25 mg total) rectally at bedtime. Patient not taking: Reported on 04/07/2022 06/23/21   [name], DO  losartan (COZAAR) 100 MG tablet Take 100 mg by mouth every morning. 11/30/21    [provider]  meclizine (ANTIVERT) 25 MG tablet Take 1 tablet (25 mg total) by mouth 3 (three) times daily as needed for dizziness. Patient not taking: Reported on 04/07/2022 06/23/21   [name], DO  methocarbamol (ROBAXIN) 500 MG tablet Take 1 tablet (500 mg total) by mouth every 8 (eight) hours as needed for muscle spasms. Patient not taking: Reported on 04/07/2022 06/23/21   [name], DO  Polyethyl Glycol-Propyl Glycol 0.4-0.3 % SOLN Place 1 drop into both eyes daily.    [provider]  pravastatin (PRAVACHOL) 40 MG tablet Take 40 mg by mouth at bedtime.    [provider]      Allergies    Morphine and related    Review of Systems   Review of Systems  Constitutional:  Positive for fatigue. Negative for chills and fever.  Respiratory:  Negative for shortness of breath.   Cardiovascular:  Negative for chest pain.  Gastrointestinal:  Positive for blood in stool and hematochezia. Negative for abdominal pain, constipation, diarrhea, nausea and vomiting.  Genitourinary:  Negative for dysuria and hematuria.  Musculoskeletal:  Negative for joint swelling.  Neurological:  Negative for light-headedness and headaches.   Physical Exam Updated Vital Signs BP 140/60   Pulse 62   Temp 98 F (36.7 C) (Oral)   Resp 16   SpO2 100%  Physical Exam Vitals and nursing note reviewed. Exam conducted with a chaperone  present Judson Roch, RN).  Constitutional:      Appearance: Normal appearance.  HENT:     Head: Normocephalic and atraumatic.     Mouth/Throat:     Mouth: Mucous membranes are moist.  Eyes:     General: No scleral icterus. Cardiovascular:     Rate and Rhythm: Normal rate and regular rhythm.  Pulmonary:     Effort: Pulmonary effort is normal. No respiratory distress.     Breath sounds: Normal breath sounds.  Abdominal:     General: Abdomen is flat. Bowel sounds are normal.     Palpations: Abdomen is soft.     Tenderness: There is no abdominal  tenderness. There is no guarding or rebound.  Genitourinary:    Comments: No hemorrhoid visible or palpated.  Good rectal tone.  Brown stool without any blood noted.  Patient was Hemoccult positive. Musculoskeletal:        General: No deformity.     Cervical back: Normal range of motion.  Skin:    General: Skin is warm and dry.  Neurological:     General: No focal deficit present.     Mental Status: She is alert. Mental status is at baseline.    ED Results / Procedures / Treatments   Labs (all labs ordered are listed, but only abnormal results are displayed) Labs Reviewed  CBC WITH DIFFERENTIAL/PLATELET - Abnormal; Notable for the following components:      Result Value   RBC 3.72 (*)    Hemoglobin 11.3 (*)    HCT 34.3 (*)    All other components within normal limits  COMPREHENSIVE METABOLIC PANEL - Abnormal; Notable for the following components:   Chloride 113 (*)    CO2 20 (*)    Glucose, Bld 104 (*)    BUN 24 (*)    Creatinine, Ser 1.66 (*)    GFR, Estimated 31 (*)    All other components within normal limits  URINALYSIS, ROUTINE W REFLEX MICROSCOPIC - Abnormal; Notable for the following components:   Leukocytes,Ua TRACE (*)    All other components within normal limits  LIPASE, BLOOD  TYPE AND SCREEN    EKG None  Radiology No results found.  Procedures Procedures   Medications Ordered in ED Medications - No data to display  ED Course/ Medical Decision Making/ A&P                           Medical Decision Making Amount and/or Complexity of Data Reviewed Labs: ordered.   81 year old female presents for evaluation of episode of rectal bleeding.  Differential diagnosis includes but limited to hemorrhoids, GI bleed, diverticular, colitis upper GI bleed.  Vital signs show mild elevated blood pressure 162/76 otherwise afebrile, normal pulse rate, satting well on room air without any increased work of breathing.  Physical exam is overall unremarkable.  Patient is  well-appearing in no acute distress.  She has normal active bowel sounds.  Her abdomen is soft and nontender without any guarding or rebound.  Rectal exam did not show any hemorrhoids on visualization or palpation.  Good rectal tone.  There was brown stool without any blood noted, however the patient was Hemoccult positive.  On prior chart investigation, the patient was seen here yesterday evening for rectal bleeding.  She was offered admission but declined.  Her return precautions were if she had any additional episodes of bleeding to come back.  She does have a history of GI bleeding,  however is not on any anti-inflammatories, aspirins, or blood thinners.  I independently reviewed and interpreted the patient's labs.  CBC shows mild anemia with a hemoglobin 11.3, was 11.4 yesterday.  No leukocytosis.  CMP shows mild elevated chloride at 113, CO2 at 20, mild elevated glucose at 104, creatinine at 1.66 with slightly elevated BUN 24.  These appear to be congruent with her labs yesterday.  Normal lipase.  Urinalysis shows trace leukocytes however there is no bacteria or white blood cells seen.  Hemoccult positive.  Given that patient only had 1 to 2 seconds of left lower quadrant pain today and does not have any tenderness on palpation, I do not think any additional imaging is warranted.  Given her labs are congruent with the labs from yesterday practically, will consult GI again.  I called and spoke with Dr.Magod with [facility] who was consulted yesterday for the same patient.  He is not impressed with the patient's repeat labs.  He suggest a clear liquid diet and he has scheduled the patient for an appointment at [facility] at 9 AM on Tuesday for repeat labs and a reevaluation.    I discussed his plan with the patient and the member at bedside.  We discussed liquid diet and I provided them information in the discharge paperwork about it.  We discussed strict return precautions and red flag symptoms.  They  verbalized understanding and agreed to plan.  Patient is stable being discharged home in good condition.   Final Clinical Impression(s) / ED Diagnoses Final diagnoses:  Rectal bleeding    Rx / DC Orders ED Discharge Orders     None         Sherrell Puller, Hershal Coria 04/08/22 2216    Tegeler, Gwenyth Allegra, MD 04/08/22 2241

## 2022-04-08 NOTE — ED Provider Triage Note (Signed)
Emergency Medicine Provider Triage Evaluation Note  Stephanie Barber , a 81 y.o. female  was evaluated in triage.  Pt complains of bleeding with bowel movements. The patient was seen here yesterday and was offered admission but she declined. She has returned today because she had a repeat episode of dark stool with blood around 1300 today. She reports some fatigue, but no nausea or vomiting. Denies any fevers. Reports some LLQ pain that lasted a few seconds but resolved earlier today.  Review of Systems  Positive:  Negative:   Physical Exam  BP 138/72 (BP Location: Left Arm)   Pulse 63   Temp 98 F (36.7 C) (Oral)   Resp 18   SpO2 98%  Gen:   Awake, no distress   Resp:  Normal effort  MSK:   Moves extremities without difficulty  Other:    Medical Decision Making  Medically screening exam initiated at 2:07 PM.  Appropriate orders placed.  Stephanie Barber was informed that the remainder of the evaluation will be completed by another provider, this initial triage assessment does not replace that evaluation, and the importance of remaining in the ED until their evaluation is complete.  Will repeat lab work. Defer any imaging until the patient is further evaluated in the back.    Sherrell Puller, PA-C 04/08/22 1410

## 2022-04-08 NOTE — Discharge Instructions (Addendum)
You were seen in the emergency department for evaluation of your rectal bleeding. Your labs are relatively similar to your labs yesterday. I have spoken with [facility] and they have arranged an appointment with you on Tuesday at 9:00AM. Remember, you will need to adhere to a CLEAR LIQUID DIET until you can be seen by GI. I have included more information on this as well as rectal bleeding to this discharge paperwork. Please go to the office at that time for evaluation and repeat labs. If you have any abdominal pain, worsening bleeding, lightheadedness, fever, chest pain, or shortness of breath, please return to the ER for evaluation.   Contact a health care provider if you: Have pain or tenderness in your abdomen. Have a fever. Have weakness. Have nausea. Cannot have a bowel movement. Get help right away if you have: New or increased rectal bleeding. Black or dark red stools. Vomit with blood or something that looks like coffee grounds. A fainting episode. Severe pain in your rectum.

## 2022-04-08 NOTE — ED Triage Notes (Signed)
Pt reports she was here yesterday for black stool and decided that she wanted to go home yesterday because she was feeling better. Pt had another episode of black stool today so she decided to come back. Pt denies abdominal pain.

## 2022-04-11 DIAGNOSIS — D509 Iron deficiency anemia, unspecified: Secondary | ICD-10-CM | POA: Diagnosis not present

## 2022-04-11 DIAGNOSIS — K625 Hemorrhage of anus and rectum: Secondary | ICD-10-CM | POA: Diagnosis not present

## 2022-04-12 DIAGNOSIS — N1832 Chronic kidney disease, stage 3b: Secondary | ICD-10-CM | POA: Diagnosis not present

## 2022-04-12 DIAGNOSIS — E1169 Type 2 diabetes mellitus with other specified complication: Secondary | ICD-10-CM | POA: Diagnosis not present

## 2022-04-12 DIAGNOSIS — E7800 Pure hypercholesterolemia, unspecified: Secondary | ICD-10-CM | POA: Diagnosis not present

## 2022-04-12 DIAGNOSIS — I10 Essential (primary) hypertension: Secondary | ICD-10-CM | POA: Diagnosis not present

## 2022-04-12 DIAGNOSIS — D509 Iron deficiency anemia, unspecified: Secondary | ICD-10-CM | POA: Diagnosis not present

## 2022-04-12 DIAGNOSIS — R202 Paresthesia of skin: Secondary | ICD-10-CM | POA: Diagnosis not present

## 2022-04-12 DIAGNOSIS — E46 Unspecified protein-calorie malnutrition: Secondary | ICD-10-CM | POA: Diagnosis not present

## 2022-04-12 DIAGNOSIS — Z0000 Encounter for general adult medical examination without abnormal findings: Secondary | ICD-10-CM | POA: Diagnosis not present

## 2022-04-12 DIAGNOSIS — G5600 Carpal tunnel syndrome, unspecified upper limb: Secondary | ICD-10-CM | POA: Diagnosis not present

## 2022-04-18 ENCOUNTER — Other Ambulatory Visit: Payer: Medicare Other

## 2022-05-03 DIAGNOSIS — D509 Iron deficiency anemia, unspecified: Secondary | ICD-10-CM | POA: Diagnosis not present

## 2022-06-07 ENCOUNTER — Inpatient Hospital Stay: Payer: Medicare Other | Attending: Physician Assistant

## 2022-06-07 ENCOUNTER — Inpatient Hospital Stay (HOSPITAL_BASED_OUTPATIENT_CLINIC_OR_DEPARTMENT_OTHER): Payer: Medicare Other | Admitting: Physician Assistant

## 2022-06-07 ENCOUNTER — Other Ambulatory Visit: Payer: Self-pay

## 2022-06-07 VITALS — BP 176/67 | HR 63 | Temp 97.7°F | Resp 15 | Wt 119.8 lb

## 2022-06-07 DIAGNOSIS — Z86718 Personal history of other venous thrombosis and embolism: Secondary | ICD-10-CM | POA: Diagnosis not present

## 2022-06-07 DIAGNOSIS — K922 Gastrointestinal hemorrhage, unspecified: Secondary | ICD-10-CM | POA: Diagnosis not present

## 2022-06-07 DIAGNOSIS — Z79899 Other long term (current) drug therapy: Secondary | ICD-10-CM | POA: Diagnosis not present

## 2022-06-07 DIAGNOSIS — D508 Other iron deficiency anemias: Secondary | ICD-10-CM | POA: Diagnosis not present

## 2022-06-07 DIAGNOSIS — D500 Iron deficiency anemia secondary to blood loss (chronic): Secondary | ICD-10-CM | POA: Insufficient documentation

## 2022-06-07 LAB — CBC WITH DIFFERENTIAL (CANCER CENTER ONLY)
Abs Immature Granulocytes: 0 10*3/uL (ref 0.00–0.07)
Basophils Absolute: 0 10*3/uL (ref 0.0–0.1)
Basophils Relative: 1 %
Eosinophils Absolute: 0.2 10*3/uL (ref 0.0–0.5)
Eosinophils Relative: 6 %
HCT: 34.1 % — ABNORMAL LOW (ref 36.0–46.0)
Hemoglobin: 11.2 g/dL — ABNORMAL LOW (ref 12.0–15.0)
Immature Granulocytes: 0 %
Lymphocytes Relative: 31 %
Lymphs Abs: 1.2 10*3/uL (ref 0.7–4.0)
MCH: 31.5 pg (ref 26.0–34.0)
MCHC: 32.8 g/dL (ref 30.0–36.0)
MCV: 95.8 fL (ref 80.0–100.0)
Monocytes Absolute: 0.4 10*3/uL (ref 0.1–1.0)
Monocytes Relative: 9 %
Neutro Abs: 2.1 10*3/uL (ref 1.7–7.7)
Neutrophils Relative %: 53 %
Platelet Count: 227 10*3/uL (ref 150–400)
RBC: 3.56 MIL/uL — ABNORMAL LOW (ref 3.87–5.11)
RDW: 13.3 % (ref 11.5–15.5)
WBC Count: 4 10*3/uL (ref 4.0–10.5)
nRBC: 0 % (ref 0.0–0.2)

## 2022-06-07 LAB — IRON AND IRON BINDING CAPACITY (CC-WL,HP ONLY)
Iron: 76 ug/dL (ref 28–170)
Saturation Ratios: 27 % (ref 10.4–31.8)
TIBC: 287 ug/dL (ref 250–450)
UIBC: 211 ug/dL

## 2022-06-07 NOTE — Progress Notes (Unsigned)
[place] [phone]   [phone]  PROGRESS NOTE  Patient Care Team: [name], MD as PCP - General (Family Medicine) Ronald Lobo, MD as Consulting Physician (Gastroenterology) Clance, Armando Reichert, MD as Consulting Physician (Pulmonary Disease)  Hematological/Oncological History 1) 05/24/2021: Labs from PCP, Dr. [name] showed WBC 5.0, Hgb 8.7 (L), MCV 85.2, Plt 277, Ferritin 6.2 (L), Iron 24 (L), Transferrin 315, Iron saturation 6%, TIBC 441.   2)Admitted from 06/15/2021-06/17/2021 for multiple episodes of rectal bleeding. Hgb was 8.3, Fecal occult blood test was positive. Patient was taking Xarelto for history of DVT, last dose 06/14/2021. CT scan showed some diverticulosis with no active bleeding. Underwent EGD and colonoscopy with no source of bleeding. Felt to be diverticular bleeding exacerbated by use of anticoagulation. Xarelto was stopped and received 3 units of pRBC. Hgb was 11.8.  3) Readmitted from 06/20/2021-06/23/2021 for drop in Hgb from 11.8 to 10.7. Recommended to initiate ferrous sulfate 325 mg once daily and to follow up with PCP.   4) 07/06/2021: Establish care with Dede Query PA-C  5) 07/27/2021-08/27/2021: Received IV venofer x 5 doses   CHIEF COMPLAINTS/PURPOSE OF CONSULTATION:  "Iron deficiency anemia"  HISTORY OF PRESENTING ILLNESS:  Stephanie Barber 81 y.o. female returns to the clinic for follow-up for iron deficiency anemia. She was last seen in the office on 12/07/21. Since then patient was recently seen in the ED on 04/07/2022 for rectal bleeding.   Ms. Pitter reports that she hasn't had repeat episodes of rectal bleeding since ED visit. She is not taking iron supplementation at this time. She reports her energy levels are fairly stable. She has been gaining weight over the last several months. She denies nausea, vomiting or abdominal pain. Her bowel habits are unchanged without any recurrent episodes of diarrhea or  constipation. She denies fevers, chills, night sweats, shortness of breath, chest pain or cough. She has no other complaints. Rest of the 10 point ROS  is below.    MEDICAL HISTORY:  Past Medical History:  Diagnosis Date   Arthritis    Chronic headache    Complication of anesthesia    allergy to Novocaine    Diabetes mellitus    GERD (gastroesophageal reflux disease)    GI bleed    Heart murmur    High cholesterol    Hyperlipidemia    Hypertension    Intracranial atherosclerosis    per MRA   Migraine    Narcolepsy and cataplexy    OSA on CPAP    states has not used CPAP in over a year   Rectal prolapse    Trigger finger of all digits of right hand     SURGICAL HISTORY: Past Surgical History:  Procedure Laterality Date   ABDOMINAL HYSTERECTOMY     Fibroids   BACK SURGERY     L4,L5 discectomy   CARDIAC CATHETERIZATION     CARPAL TUNNEL RELEASE Right 01/18/2017   COLONOSCOPY WITH PROPOFOL N/A 06/17/2021   Procedure: COLONOSCOPY WITH PROPOFOL;  Surgeon: Wilford Corner, MD;  Location: [facility];  Service: Endoscopy;  Laterality: N/A;   ESOPHAGOGASTRODUODENOSCOPY (EGD) WITH PROPOFOL N/A 06/17/2021   Procedure: ESOPHAGOGASTRODUODENOSCOPY (EGD) WITH PROPOFOL;  Surgeon: Wilford Corner, MD;  Location: [facility];  Service: Endoscopy;  Laterality: N/A;   KIDNEY SURGERY  1998   Right.  growth removed   POLYPECTOMY  06/17/2021   Procedure: POLYPECTOMY;  Surgeon: Wilford Corner, MD;  Location: [facility];  Service: Endoscopy;;   rectal prolapse  repair  12/05/2011   ROTATOR CUFF REPAIR     Left   SHOULDER ARTHROSCOPY WITH SUBACROMIAL DECOMPRESSION, ROTATOR CUFF REPAIR AND BICEP TENDON REPAIR Left 05/25/2015   Procedure: SHOULDER ARTHROSCOPY WITH SUBACROMIAL DECOMPRESSION, ROTATOR CUFF REPAIR AND BICEP TENODESIS, DEBRIDEMENT. ;  Surgeon: Meredith Pel, MD;  Location: [facility];  Service: Orthopedics;  Laterality: Left;  LEFT SHOULDER ROTATOR CUFF TEAR REPAIR,  ARTHROSCOPY, DEBRIDEMENT, BICEPS TENODESIS, SUBACROMIAL DECOMPRESSION.   TOTAL ABDOMINAL HYSTERECTOMY     TRIGGER FINGER RELEASE Right 04/30/2018   Procedure: RELEASE TRIGGER FINGER/A-1 PULLEY RIGHT MIDDLE  AND SMALLn ;  Surgeon: Daryll Brod, MD;  Location: [place];  Service: Orthopedics;  Laterality: Right;  FAB, Bier block   TUBAL LIGATION      SOCIAL HISTORY: Social History   Socioeconomic History   Marital status: Married    Spouse name: [name]   Number of children: 4   Years of education: 14   Highest education level: Not on file  Occupational History   Occupation: retired. prev worked at [employer].  Tobacco Use   Smoking status: Former    Types: Cigarettes    Quit date: 11/13/1980    Years since quitting: 41.5   Smokeless tobacco: Never   Tobacco comments:    quit in 1982  Vaping Use   Vaping Use: Never used  Substance and Sexual Activity   Alcohol use: No    Alcohol/week: 0.0 standard drinks of alcohol   Drug use: No   Sexual activity: Yes  Other Topics Concern   Not on file  Social History Narrative   21 st January 2014 , patient underwent PS and MSLT - MSLT had one  SREMs and an average time to fall asleep of  *.8 minutes , her Ewort is 20 points,: facit: this patient has severe hypersomnia and is at risk when driving. medication in form ogf nuvigil smaples had been dispensed to her but she ha not yet taken it. 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 In detail -discussion of diagnosis and treatment  takes place today , first with nuvigil and if insufficient, with XYREM. Her CPAP treats her OSA very well, and she uses it 6 hours  or more each night,  residual AHI of 1.1  would not allow for OSA to be still explaining this degree of sleepiness. 2 downloads were reviewed. labs reviewed.    Patient  is married [name]) and lives at home with her husband and grandchild.   Patient has four children   Patient is retired.   Patient has a college education.   Patient is right-handed.   Patient does not drink any caffeine.      Social Determinants of Health   Financial Resource Strain: Not on file  Food Insecurity: Not on file  Transportation Needs: Not on file  Physical Activity: Not on file  Stress: Not on file  Social Connections: Not on file  Intimate Partner Violence: Not on file    FAMILY HISTORY: Family History  Problem Relation Age of Onset   Heart disease Mother    Throat cancer Father    Cancer Father        stomach   Kidney cancer Brother    Heart disease Brother     ALLERGIES:  is allergic to morphine and  related.  MEDICATIONS:  Current Outpatient Medications  Medication Sig Dispense Refill   acetaminophen (TYLENOL) 500 MG tablet Take 500 mg by mouth every 6 (six) hours as needed for moderate pain.     Cinnamon 500 MG capsule Take 500 mg by mouth daily.     donepezil (ARICEPT) 10 MG tablet Take 10 mg by mouth at bedtime.     Ferrous Sulfate (IRON) 325 (65 Fe) MG TABS Take 1 tablet (325 mg total) by mouth daily. 30 tablet 0   losartan (COZAAR) 100 MG tablet Take 100 mg by mouth every morning.     methocarbamol (ROBAXIN) 500 MG tablet Take 1 tablet (500 mg total) by mouth every 8 (eight) hours as needed for muscle spasms. 10 tablet 0   Polyethyl Glycol-Propyl Glycol 0.4-0.3 % SOLN Place 1 drop into both eyes daily.     pravastatin (PRAVACHOL) 40 MG tablet Take 40 mg by mouth at bedtime.     hydrocortisone (ANUSOL-HC) 25 MG suppository Place 1 suppository (25 mg total) rectally at bedtime. (Patient not taking: Reported on 04/07/2022) 12 suppository 0   meclizine (ANTIVERT) 25 MG tablet Take 1 tablet (25 mg total) by mouth 3 (three) times daily as needed for dizziness. (Patient not taking: Reported on 04/07/2022) 30 tablet 0   No current facility-administered  medications for this visit.    REVIEW OF SYSTEMS:   Constitutional: ( - ) fevers, ( - )  chills , ( - ) night sweats Eyes: ( - ) blurriness of vision, ( - ) double vision, ( - ) watery eyes Ears, nose, mouth, throat, and face: ( - ) mucositis, ( - ) sore throat Respiratory: ( - ) cough, ( - ) dyspnea, ( - ) wheezes Cardiovascular: ( - ) palpitation, ( - ) chest discomfort, ( - ) lower extremity swelling Gastrointestinal:  ( - ) nausea, ( - ) heartburn, ( - ) change in bowel habits Skin: ( - ) abnormal skin rashes Lymphatics: ( - ) new lymphadenopathy, ( - ) easy bruising Neurological: ( - ) numbness, ( - ) tingling, ( - ) new weaknesses Behavioral/Psych: ( - ) mood change, ( - ) new changes  All other systems were reviewed with the patient and are negative.  PHYSICAL EXAMINATION: ECOG PERFORMANCE STATUS: 0 - Asymptomatic  Vitals:   06/07/22 1525  BP: (!) 176/67  Pulse: 63  Resp: 15  Temp: 97.7 F (36.5 C)  SpO2: 98%   Filed Weights   06/07/22 1525  Weight: 119 lb 12.8 oz (54.3 kg)    GENERAL: well appearing female in NAD  SKIN: skin color, texture, turgor are normal, no rashes or significant lesions EYES: conjunctiva are pink and non-injected, sclera clear.  LUNGS: clear to auscultation and percussion with normal breathing effort HEART: regular rate & rhythm and no murmurs and no lower extremity edema Musculoskeletal: no cyanosis of digits and no clubbing  PSYCH: alert & oriented x 3, fluent speech NEURO: no focal motor/sensory deficits  LABORATORY DATA:  I have reviewed the data as listed    Latest Ref Rng & Units 06/07/2022    1:57 PM 04/08/2022    2:11 PM 04/07/2022    2:49 PM  CBC  WBC 4.0 - 10.5 K/uL 4.0  5.0  5.0   Hemoglobin 12.0 - 15.0 g/dL 11.2  11.3  11.4   Hematocrit 36.0 - 46.0 % 34.1  34.3  35.2   Platelets 150 - 400 K/uL 227  241  239  Latest Ref Rng & Units 04/08/2022    2:11 PM 04/07/2022    2:49 PM 07/06/2021   12:22 PM  CMP  Glucose 70 -  99 mg/dL 104  95  103   BUN 8 - 23 mg/dL '24  26  24   '$ Creatinine 0.44 - 1.00 mg/dL 1.66  1.67  1.30   Sodium 135 - 145 mmol/L 139  140  142   Potassium 3.5 - 5.1 mmol/L 4.2  4.7  4.3   Chloride 98 - 111 mmol/L 113  114  111   CO2 22 - 32 mmol/L '20  22  22   '$ Calcium 8.9 - 10.3 mg/dL 9.5  9.1  9.6   Total Protein 6.5 - 8.1 g/dL 7.6  7.6  7.0   Total Bilirubin 0.3 - 1.2 mg/dL 0.6  0.5  0.3   Alkaline Phos 38 - 126 U/L 49  53  53   AST 15 - 41 U/L '22  18  19   '$ ALT 0 - 44 U/L '12  12  13     '$ RADIOGRAPHIC STUDIES: I have personally reviewed the radiological images as listed and agreed with the findings in the report. No results found.  ASSESSMENT & PLAN TIMIKO OFFUTT is a 81 y.o. female returns for a follow up for iron deficiency anemia.   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 Most recent EGD and colonoscopy from 06/17/2021 with no signs of active bleeding. Requested follow up due to recent episode of rectal bleeding in May 2023.  --Recommend to continue to incorporate iron rich foods into diet.  --Received IV venofer x 5 doses from 07/27/2021-08/27/2021.  --Labs today shows mild anemia with Hgb 11.2, MCV 95.8. Iron panel shows serum iron 76, saturation 27%, Ferritin is 134 --Not taking iron supplementation at this time.  With periodic episodes of rectal bleeding. Recommend to resume ferrous sulfate 325 mg once daily with a source of vitamin C --RTC in 6 months with repeat labs.   #DVT of right femoral vein: --Diagnosed in 06/03/2019. CTA negative for PE. Started on Xarelto.  --Discontinued with during  hospitalization for GI bleed.   #Appetite loss/lethargy: --Improved with noted weight gain from 106 lbs to 119 lbs.  --Continue to monitor.   No orders of the defined types were placed in this encounter.   All questions were answered. The patient knows to call the clinic with any problems, questions or concerns.  I have spent  a total of 25 minutes minutes of face-to-face and non-face-to-face time, preparing to see the patient, performing a medically appropriate examination, counseling and educating the patient, documenting clinical information in the electronic health record, and care coordination.    Dede Query, PA-C Department of Hematology/Oncology [place] [facility] Phone: [phone]

## 2022-06-08 ENCOUNTER — Encounter: Payer: Self-pay | Admitting: Physician Assistant

## 2022-06-08 ENCOUNTER — Telehealth: Payer: Self-pay

## 2022-06-08 ENCOUNTER — Telehealth: Payer: Self-pay | Admitting: Physician Assistant

## 2022-06-08 LAB — FERRITIN: Ferritin: 134 ng/mL (ref 11–307)

## 2022-06-08 NOTE — Telephone Encounter (Signed)
Pt advised with VU 

## 2022-06-08 NOTE — Telephone Encounter (Signed)
-----   Message from Lincoln Brigham, PA-C sent at 06/08/2022 12:18 PM EDT ----- Please notify patient that iron levels are stable. No need for IV iron. Due to periodic episodes of rectal bleeding, recommend to take iron pills once a day as before.

## 2022-06-08 NOTE — Telephone Encounter (Signed)
Scheduled per 7/26 los, pt has been called and confirmed

## 2022-06-14 DIAGNOSIS — D649 Anemia, unspecified: Secondary | ICD-10-CM | POA: Diagnosis not present

## 2022-06-14 DIAGNOSIS — N1832 Chronic kidney disease, stage 3b: Secondary | ICD-10-CM | POA: Diagnosis not present

## 2022-07-03 DIAGNOSIS — D509 Iron deficiency anemia, unspecified: Secondary | ICD-10-CM | POA: Diagnosis not present

## 2022-07-03 DIAGNOSIS — I10 Essential (primary) hypertension: Secondary | ICD-10-CM | POA: Diagnosis not present

## 2022-07-07 DIAGNOSIS — E875 Hyperkalemia: Secondary | ICD-10-CM | POA: Diagnosis not present

## 2022-07-23 ENCOUNTER — Other Ambulatory Visit: Payer: Self-pay

## 2022-07-23 ENCOUNTER — Observation Stay (HOSPITAL_COMMUNITY): Payer: Medicare Other

## 2022-07-23 ENCOUNTER — Encounter (HOSPITAL_COMMUNITY): Payer: Self-pay

## 2022-07-23 ENCOUNTER — Inpatient Hospital Stay (HOSPITAL_COMMUNITY)
Admission: EM | Admit: 2022-07-23 | Discharge: 2022-07-29 | DRG: 378 | Disposition: A | Discharge status: Home Health | Payer: Medicare Other | Attending: Internal Medicine | Admitting: Internal Medicine

## 2022-07-23 DIAGNOSIS — G47411 Narcolepsy with cataplexy: Secondary | ICD-10-CM | POA: Diagnosis present

## 2022-07-23 DIAGNOSIS — I10 Essential (primary) hypertension: Secondary | ICD-10-CM

## 2022-07-23 DIAGNOSIS — Z885 Allergy status to narcotic agent status: Secondary | ICD-10-CM

## 2022-07-23 DIAGNOSIS — K649 Unspecified hemorrhoids: Secondary | ICD-10-CM | POA: Diagnosis present

## 2022-07-23 DIAGNOSIS — K5731 Diverticulosis of large intestine without perforation or abscess with bleeding: Secondary | ICD-10-CM | POA: Diagnosis not present

## 2022-07-23 DIAGNOSIS — M1990 Unspecified osteoarthritis, unspecified site: Secondary | ICD-10-CM | POA: Diagnosis not present

## 2022-07-23 DIAGNOSIS — I129 Hypertensive chronic kidney disease with stage 1 through stage 4 chronic kidney disease, or unspecified chronic kidney disease: Secondary | ICD-10-CM | POA: Diagnosis not present

## 2022-07-23 DIAGNOSIS — K5791 Diverticulosis of intestine, part unspecified, without perforation or abscess with bleeding: Secondary | ICD-10-CM | POA: Diagnosis not present

## 2022-07-23 DIAGNOSIS — I672 Cerebral atherosclerosis: Secondary | ICD-10-CM | POA: Diagnosis present

## 2022-07-23 DIAGNOSIS — E1122 Type 2 diabetes mellitus with diabetic chronic kidney disease: Secondary | ICD-10-CM | POA: Diagnosis not present

## 2022-07-23 DIAGNOSIS — G4733 Obstructive sleep apnea (adult) (pediatric): Secondary | ICD-10-CM

## 2022-07-23 DIAGNOSIS — D509 Iron deficiency anemia, unspecified: Secondary | ICD-10-CM | POA: Diagnosis present

## 2022-07-23 DIAGNOSIS — K625 Hemorrhage of anus and rectum: Secondary | ICD-10-CM | POA: Diagnosis not present

## 2022-07-23 DIAGNOSIS — K5750 Diverticulosis of both small and large intestine without perforation or abscess without bleeding: Secondary | ICD-10-CM | POA: Diagnosis not present

## 2022-07-23 DIAGNOSIS — D124 Benign neoplasm of descending colon: Secondary | ICD-10-CM | POA: Diagnosis not present

## 2022-07-23 DIAGNOSIS — I959 Hypotension, unspecified: Secondary | ICD-10-CM | POA: Diagnosis not present

## 2022-07-23 DIAGNOSIS — D62 Acute posthemorrhagic anemia: Secondary | ICD-10-CM | POA: Diagnosis not present

## 2022-07-23 DIAGNOSIS — N1832 Chronic kidney disease, stage 3b: Secondary | ICD-10-CM | POA: Diagnosis not present

## 2022-07-23 DIAGNOSIS — K922 Gastrointestinal hemorrhage, unspecified: Principal | ICD-10-CM | POA: Diagnosis present

## 2022-07-23 DIAGNOSIS — R001 Bradycardia, unspecified: Secondary | ICD-10-CM | POA: Diagnosis not present

## 2022-07-23 DIAGNOSIS — Z9989 Dependence on other enabling machines and devices: Secondary | ICD-10-CM | POA: Diagnosis not present

## 2022-07-23 DIAGNOSIS — Z8249 Family history of ischemic heart disease and other diseases of the circulatory system: Secondary | ICD-10-CM

## 2022-07-23 DIAGNOSIS — Z980 Intestinal bypass and anastomosis status: Secondary | ICD-10-CM | POA: Diagnosis not present

## 2022-07-23 DIAGNOSIS — R55 Syncope and collapse: Secondary | ICD-10-CM | POA: Diagnosis not present

## 2022-07-23 DIAGNOSIS — Z79899 Other long term (current) drug therapy: Secondary | ICD-10-CM

## 2022-07-23 DIAGNOSIS — K635 Polyp of colon: Secondary | ICD-10-CM | POA: Diagnosis not present

## 2022-07-23 DIAGNOSIS — N200 Calculus of kidney: Secondary | ICD-10-CM | POA: Diagnosis not present

## 2022-07-23 DIAGNOSIS — Q600 Renal agenesis, unilateral: Secondary | ICD-10-CM | POA: Diagnosis not present

## 2022-07-23 DIAGNOSIS — E7800 Pure hypercholesterolemia, unspecified: Secondary | ICD-10-CM | POA: Diagnosis not present

## 2022-07-23 DIAGNOSIS — Z90710 Acquired absence of both cervix and uterus: Secondary | ICD-10-CM

## 2022-07-23 DIAGNOSIS — Z8719 Personal history of other diseases of the digestive system: Secondary | ICD-10-CM | POA: Diagnosis not present

## 2022-07-23 DIAGNOSIS — K921 Melena: Secondary | ICD-10-CM | POA: Diagnosis not present

## 2022-07-23 DIAGNOSIS — Z86718 Personal history of other venous thrombosis and embolism: Secondary | ICD-10-CM | POA: Diagnosis not present

## 2022-07-23 DIAGNOSIS — Z87891 Personal history of nicotine dependence: Secondary | ICD-10-CM | POA: Diagnosis not present

## 2022-07-23 DIAGNOSIS — K219 Gastro-esophageal reflux disease without esophagitis: Secondary | ICD-10-CM | POA: Diagnosis not present

## 2022-07-23 DIAGNOSIS — D500 Iron deficiency anemia secondary to blood loss (chronic): Secondary | ICD-10-CM | POA: Diagnosis not present

## 2022-07-23 DIAGNOSIS — Z91011 Allergy to milk products: Secondary | ICD-10-CM

## 2022-07-23 DIAGNOSIS — K5730 Diverticulosis of large intestine without perforation or abscess without bleeding: Secondary | ICD-10-CM | POA: Diagnosis not present

## 2022-07-23 HISTORY — DX: Chronic kidney disease, stage 3b: N18.32

## 2022-07-23 LAB — BASIC METABOLIC PANEL
Anion gap: 7 (ref 5–15)
BUN: 21 mg/dL (ref 8–23)
CO2: 18 mmol/L — ABNORMAL LOW (ref 22–32)
Calcium: 8.9 mg/dL (ref 8.9–10.3)
Chloride: 118 mmol/L — ABNORMAL HIGH (ref 98–111)
Creatinine, Ser: 1.43 mg/dL — ABNORMAL HIGH (ref 0.44–1.00)
GFR, Estimated: 37 mL/min — ABNORMAL LOW (ref >60)
Glucose, Bld: 127 mg/dL — ABNORMAL HIGH (ref 70–99)
Potassium: 4.6 mmol/L (ref 3.5–5.1)
Sodium: 143 mmol/L (ref 135–145)

## 2022-07-23 LAB — COMPREHENSIVE METABOLIC PANEL
ALT: 10 U/L (ref 0–44)
AST: 17 U/L (ref 15–41)
Albumin: 4.3 g/dL (ref 3.5–5.0)
Alkaline Phosphatase: 63 U/L (ref 38–126)
Anion gap: 6 (ref 5–15)
BUN: 30 mg/dL — ABNORMAL HIGH (ref 8–23)
CO2: 22 mmol/L (ref 22–32)
Calcium: 9.6 mg/dL (ref 8.9–10.3)
Chloride: 115 mmol/L — ABNORMAL HIGH (ref 98–111)
Creatinine, Ser: 1.85 mg/dL — ABNORMAL HIGH (ref 0.44–1.00)
GFR, Estimated: 27 mL/min — ABNORMAL LOW (ref >60)
Glucose, Bld: 137 mg/dL — ABNORMAL HIGH (ref 70–99)
Potassium: 4.8 mmol/L (ref 3.5–5.1)
Sodium: 143 mmol/L (ref 135–145)
Total Bilirubin: 0.4 mg/dL (ref 0.3–1.2)
Total Protein: 7.7 g/dL (ref 6.5–8.1)

## 2022-07-23 LAB — APTT: aPTT: 29 seconds (ref 24–36)

## 2022-07-23 LAB — PROTIME-INR
INR: 1.3 — ABNORMAL HIGH (ref 0.8–1.2)
Prothrombin Time: 15.6 seconds — ABNORMAL HIGH (ref 11.4–15.2)

## 2022-07-23 LAB — CBC
HCT: 32 % — ABNORMAL LOW (ref 36.0–46.0)
Hemoglobin: 10.2 g/dL — ABNORMAL LOW (ref 12.0–15.0)
MCH: 30.4 pg (ref 26.0–34.0)
MCHC: 31.9 g/dL (ref 30.0–36.0)
MCV: 95.5 fL (ref 80.0–100.0)
Platelets: 257 10*3/uL (ref 150–400)
RBC: 3.35 MIL/uL — ABNORMAL LOW (ref 3.87–5.11)
RDW: 12 % (ref 11.5–15.5)
WBC: 7 10*3/uL (ref 4.0–10.5)
nRBC: 0 % (ref 0.0–0.2)

## 2022-07-23 LAB — HEMOGLOBIN
Hemoglobin: 8.5 g/dL — ABNORMAL LOW (ref 12.0–15.0)
Hemoglobin: 7.9 g/dL — ABNORMAL LOW (ref 12.0–15.0)

## 2022-07-23 LAB — CBG MONITORING, ED: Glucose-Capillary: 143 mg/dL — ABNORMAL HIGH (ref 70–99)

## 2022-07-23 LAB — PREPARE RBC (CROSSMATCH)

## 2022-07-23 LAB — HEMATOCRIT
HCT: 26.6 % — ABNORMAL LOW (ref 36.0–46.0)
HCT: 24.5 % — ABNORMAL LOW (ref 36.0–46.0)

## 2022-07-23 LAB — GLUCOSE, CAPILLARY: Glucose-Capillary: 109 mg/dL — ABNORMAL HIGH (ref 70–99)

## 2022-07-23 MED ORDER — PSYLLIUM 95 % PO PACK
1.0000 | PACK | Freq: Two times a day (BID) | ORAL | Status: DC
  Administered 2022-07-23 – 2022-07-26 (×3): 1 via ORAL
  Filled 2022-07-23 (×7): qty 1

## 2022-07-23 MED ORDER — SODIUM CHLORIDE 0.9 % IV BOLUS
1000.0000 mL | Freq: Once | INTRAVENOUS | Status: AC
  Administered 2022-07-23: 1000 mL via INTRAVENOUS

## 2022-07-23 MED ORDER — ONDANSETRON HCL 4 MG/2ML IJ SOLN: 4.0000 mg | Freq: Four times a day (QID) | INTRAMUSCULAR | Status: DC | PRN

## 2022-07-23 MED ORDER — SODIUM CHLORIDE 0.9 % IV SOLN: INTRAVENOUS | Status: AC

## 2022-07-23 MED ORDER — ACETAMINOPHEN 650 MG RE SUPP: 650.0000 mg | Freq: Four times a day (QID) | RECTAL | Status: DC | PRN

## 2022-07-23 MED ORDER — PANTOPRAZOLE INFUSION (NEW) - SIMPLE MED
8.0000 mg/h | INTRAVENOUS | Status: DC
  Administered 2022-07-23: 8 mg/h via INTRAVENOUS
  Filled 2022-07-23: qty 100
  Filled 2022-07-23: qty 80

## 2022-07-23 MED ORDER — PRAVASTATIN SODIUM 20 MG PO TABS
40.0000 mg | ORAL_TABLET | Freq: Every day | ORAL | Status: DC
  Administered 2022-07-24: 40 mg via ORAL
  Filled 2022-07-23: qty 2

## 2022-07-23 MED ORDER — PANTOPRAZOLE 80MG IVPB - SIMPLE MED
80.0000 mg | Freq: Once | INTRAVENOUS | Status: AC
  Administered 2022-07-23: 80 mg via INTRAVENOUS
  Filled 2022-07-23: qty 80

## 2022-07-23 MED ORDER — ONDANSETRON HCL 4 MG PO TABS: 4.0000 mg | ORAL_TABLET | Freq: Four times a day (QID) | ORAL | Status: DC | PRN

## 2022-07-23 MED ORDER — IOHEXOL 350 MG/ML SOLN
75.0000 mL | Freq: Once | INTRAVENOUS | Status: AC | PRN
Start: 2022-07-23 — End: 2022-07-23
  Administered 2022-07-23: 75 mL via INTRAVENOUS

## 2022-07-23 MED ORDER — ACETAMINOPHEN 325 MG PO TABS
650.0000 mg | ORAL_TABLET | Freq: Four times a day (QID) | ORAL | Status: DC | PRN
  Administered 2022-07-25: 650 mg via ORAL
  Filled 2022-07-23: qty 2

## 2022-07-23 MED ORDER — PANTOPRAZOLE SODIUM 40 MG IV SOLR
40.0000 mg | Freq: Every day | INTRAVENOUS | Status: DC
  Administered 2022-07-23 – 2022-07-24 (×2): 40 mg via INTRAVENOUS
  Filled 2022-07-23 (×2): qty 10

## 2022-07-23 MED ORDER — DONEPEZIL HCL 10 MG PO TABS: 10.0000 mg | ORAL_TABLET | Freq: Every day | ORAL | Status: DC

## 2022-07-23 MED ORDER — SODIUM CHLORIDE 0.9 % IV SOLN
INTRAVENOUS | Status: DC
Start: 2022-07-23 — End: 2022-07-29

## 2022-07-23 MED ORDER — SODIUM CHLORIDE (PF) 0.9 % IJ SOLN
INTRAMUSCULAR | Status: AC
  Filled 2022-07-23: qty 50

## 2022-07-23 MED ORDER — PANTOPRAZOLE SODIUM 40 MG IV SOLR: 40.0000 mg | Freq: Two times a day (BID) | INTRAVENOUS | Status: DC

## 2022-07-23 MED ORDER — POLYVINYL ALCOHOL 1.4 % OP SOLN
1.0000 [drp] | Freq: Every day | OPHTHALMIC | Status: DC
Start: 2022-07-23 — End: 2022-07-29
  Administered 2022-07-23 – 2022-07-29 (×7): 1 [drp] via OPHTHALMIC
  Filled 2022-07-23 (×2): qty 15

## 2022-07-23 MED ORDER — METHOCARBAMOL 500 MG PO TABS: 500.0000 mg | ORAL_TABLET | Freq: Three times a day (TID) | ORAL | Status: DC | PRN

## 2022-07-23 MED ORDER — SODIUM CHLORIDE 0.9% FLUSH
3.0000 mL | Freq: Two times a day (BID) | INTRAVENOUS | Status: DC
  Administered 2022-07-23 – 2022-07-29 (×13): 3 mL via INTRAVENOUS

## 2022-07-23 MED ORDER — SODIUM CHLORIDE 0.9% IV SOLUTION: Freq: Once | INTRAVENOUS | Status: AC

## 2022-07-23 NOTE — ED Provider Notes (Signed)
[facility] DEPT Provider Note   CSN: [id number] Arrival date & time: 07/23/22  0006     History  Chief Complaint  Patient presents with   Rectal Bleeding    Stephanie Barber is a 81 y.o. female.  The history is provided by the patient and medical records.  Rectal Bleeding  81 year old female with history of iron deficiency anemia, GERD, hyperlipidemia, hypertension, sleep apnea, diabetes, presenting to the ED with bloody stools.  States this began on Thursday, called PCP on Friday, however was not in the office and could not give her an appointment.  States seem to worsen today.  Stools are very dark and almost black in color.  She does report some passage of clots.  Just prior to arrival she began feeling lightheaded as if she might pass out.  She does have history of GI bleeding in the past.  She was admitted August 2022 for same, underwent EGD and colonoscopy with Dr. Michail Sermon found to have colonic polyps and diverticula throughout colon.  She reports she was previously on eliquis but has been off of this since last GI bleeding episode.   Home Medications Prior to Admission medications   Medication Sig Start Date End Date Taking? Authorizing Provider  acetaminophen (TYLENOL) 500 MG tablet Take 500 mg by mouth every 6 (six) hours as needed for moderate pain.    [provider]  Cinnamon 500 MG capsule Take 500 mg by mouth daily.    [provider]  donepezil (ARICEPT) 10 MG tablet Take 10 mg by mouth at bedtime. 03/22/22   [provider]  Ferrous Sulfate (IRON) 325 (65 Fe) MG TABS Take 1 tablet (325 mg total) by mouth daily. 06/23/21   [name], DO  losartan (COZAAR) 100 MG tablet Take 100 mg by mouth every morning. 11/30/21   [provider]  methocarbamol (ROBAXIN) 500 MG tablet Take 1 tablet (500 mg total) by mouth every 8 (eight) hours as needed for muscle spasms. 06/23/21   [name], DO  Polyethyl  Glycol-Propyl Glycol 0.4-0.3 % SOLN Place 1 drop into both eyes daily.    [provider]  pravastatin (PRAVACHOL) 40 MG tablet Take 40 mg by mouth at bedtime.    [provider]      Allergies    Morphine and related    Review of Systems   Review of Systems  Gastrointestinal:  Positive for blood in stool and hematochezia.  All other systems reviewed and are negative.   Physical Exam Updated Vital Signs BP 136/62   Pulse (!) 51   Temp 97.6 F (36.4 C) (Oral)   Resp 12   SpO2 98%   Physical Exam Vitals and nursing note reviewed.  Constitutional:      Appearance: She is well-developed.  HENT:     Head: Normocephalic and atraumatic.  Eyes:     Conjunctiva/sclera: Conjunctivae normal.     Pupils: Pupils are equal, round, and reactive to light.  Cardiovascular:     Rate and Rhythm: Normal rate and regular rhythm.     Heart sounds: Normal heart sounds.  Pulmonary:     Effort: Pulmonary effort is normal.     Breath sounds: Normal breath sounds.  Abdominal:     General: Bowel sounds are normal.     Palpations: Abdomen is soft.     Tenderness: There is no abdominal tenderness.     Comments: Soft, non-tender  Genitourinary:    Comments:  RN and NT at bedside for chaperoned exam Pilar Plate melena present around rectum and in adult brief Musculoskeletal:        General: Normal range of motion.     Cervical back: Normal range of motion.  Skin:    General: Skin is warm and dry.  Neurological:     Mental Status: She is alert and oriented to person, place, and time.     ED Results / Procedures / Treatments   Labs (all labs ordered are listed, but only abnormal results are displayed) Labs Reviewed  COMPREHENSIVE METABOLIC PANEL - Abnormal; Notable for the following components:      Result Value   Chloride 115 (*)    Glucose, Bld 137 (*)    BUN 30 (*)    Creatinine, Ser 1.85 (*)    GFR, Estimated 27 (*)    All other components within normal limits  CBC -  Abnormal; Notable for the following components:   RBC 3.35 (*)    Hemoglobin 10.2 (*)    HCT 32.0 (*)    All other components within normal limits  PROTIME-INR - Abnormal; Notable for the following components:   Prothrombin Time 15.6 (*)    INR 1.3 (*)    All other components within normal limits  CBG MONITORING, ED - Abnormal; Notable for the following components:   Glucose-Capillary 143 (*)    All other components within normal limits  APTT  POC OCCULT BLOOD, ED  TYPE AND SCREEN    EKG None  Radiology No results found.  Procedures Procedures    CRITICAL CARE Performed by: Larene Pickett   Total critical care time: 45 minutes  Critical care time was exclusive of separately billable procedures and treating other patients.  Critical care was necessary to treat or prevent imminent or life-threatening deterioration.  Critical care was time spent personally by me on the following activities: development of treatment plan with patient and/or surrogate as well as nursing, discussions with consultants, evaluation of patient's response to treatment, examination of patient, obtaining history from patient or surrogate, ordering and performing treatments and interventions, ordering and review of laboratory studies, ordering and review of radiographic studies, pulse oximetry and re-evaluation of patient's condition.   Medications Ordered in ED Medications  pantoprozole (PROTONIX) 80 mg /NS 100 mL infusion (8 mg/hr Intravenous New Bag/Given 07/23/22 0132)  pantoprazole (PROTONIX) injection 40 mg (has no administration in time range)  sodium chloride 0.9 % bolus 1,000 mL (1,000 mLs Intravenous New Bag/Given 07/23/22 0108)  pantoprazole (PROTONIX) 80 mg /NS 100 mL IVPB (0 mg Intravenous Stopped 07/23/22 0128)    ED Course/ Medical Decision Making/ A&P                           Medical Decision Making Amount and/or Complexity of Data Reviewed Labs: ordered. ECG/medicine tests:  ordered and independent interpretation performed.  Risk Prescription drug management. Decision regarding hospitalization.   81 year old female presenting to the ED with bloody stools.  This began on Thursday, called PCP yesterday but unable to be seen in office.  States today she started feeling a little weak and lightheaded.  No loss of consciousness. Reports longstanding hx of GI bleeding in the past, no longer on anticoagulation.   She is awake, alert, oriented.  She does have frank melena on exam but abdomen is soft and nontender.  Labs obtained--hemoglobin is 10.2 (baseline around 11- 11.5 based on prior values).  BUN  elevated consistent with GI bleeding.  Started IVF, protonix drip.  She will require admission.  Discussed with Dr. Myna Hidalgo-- will admit for ongoing care. Secure message sent to on call for [facility] (Dr. Therisa Doyne) for AM consultation.  Final Clinical Impression(s) / ED Diagnoses Final diagnoses:  Acute GI bleeding    Rx / DC Orders ED Discharge Orders     None         Larene Pickett, PA-C 07/23/22 0155    Maudie Flakes, MD 07/23/22 [phone]

## 2022-07-23 NOTE — Progress Notes (Signed)
Pt non-compliant with CPAP.

## 2022-07-23 NOTE — ED Triage Notes (Addendum)
Patient said she has had dark bloody stools since Thursday. History of GI bleeds in the past. Been feeling weak and dizzy.

## 2022-07-23 NOTE — Hospital Course (Addendum)
81 y.o.f with PMH of  HTN/HLD, OSA, history of DVT no longer anticoagulated,and history of GI bleeding  Presented to the ED with rectal bleeding large-volume reddish maroonish stool with few clots then following day another episode. She attributed the dark stool to being back on iron supplementation recently.  Had multiple similar episodes in the past. In VP:[id number] and saturating well on RA.BUN is 30 and creatinine 1.85.  Hemoglobin is 10.2,given a liter of normal saline, 80 mg IV Protonix>Protonix infusion, [facility] consulted and admitted. 9/10:Seen by GI placed on liquid diet but patient had large bowel movement, + has also dropped 10.2 to 8.5 g twice daily scan was ordered> changed to CT angio:VASCULAR No CT findings for active gastrointestinal hemorrhage.  Overnight patient had syncopal episode with bowel movement transferred to stepdown  9/11-12: 3 syncopal episodes with 3 bowel movement.  9/11 EXTR 2 units transfused

## 2022-07-23 NOTE — Consult Note (Signed)
[facility] Consult  Referring Provider: [facility] Primary Care Physician:  [name], MD Primary Gastroenterologist: [facility]  Reason for Consultation: Rectal bleeding  HPI: Stephanie Barber is a 81 y.o. female was in her usual state of health until Thursday, 4 days ago, when she woke up from sleep with fecal urgency and had a large bloody, dark, maroon bowel movement with clots which failed the entire toilet bowl.  Patient had an additional similar bowel movement but smaller in amount.  The rest of the day she does not have any bowel movement until the next night, Friday, 3 days ago when she had recurrence of bloody, maroon, dark stool close to midnight which prompted her to come to the ER. Patient used to be on Xarelto for blood clots, but has not been on antiplatelet, anticoagulation or NSAIDs because of history of GI bleed. She was started on ferrous sulfate because of anemia and recently took 2 pills instead of 1. She denies difficulty swallowing, pain on swallowing but has history of significant unintentional weight loss, she used to be 130 pounds and dropped her weight to 98 pounds, has regained most of it and weight 123.5 pounds on admission. She complains of early satiety, lack of appetite. She denies abdominal pain, nausea or vomiting. Since she came to the floor, she has not had any additional bowel movement. She has not required blood transfusion. She remains hemodynamically stable.  Prior GI work-up: Colonoscopy 06/27/2021 performed for hematochezia showed pandiverticulosis, anastomosis at rectosigmoid, tubular adenoma removed from sigmoid EGD 06/17/2021: Unremarkable Colonoscopy 2018, Dr. Cristina Gong: Tubular adenoma removed from cecum, pandiverticulosis Colonoscopy 2012: Tubular adenoma removed from sigmoid EGD 2012 performed for iron deficiency anemia: Unremarkable except 2 cm hiatal hernia   Past Medical History:  Diagnosis Date    Arthritis    Chronic headache    Complication of anesthesia    allergy to Novocaine    Diabetes mellitus    GERD (gastroesophageal reflux disease)    GI bleed    Heart murmur    High cholesterol    Hyperlipidemia    Hypertension    Intracranial atherosclerosis    per MRA   Migraine    Narcolepsy and cataplexy    OSA on CPAP    states has not used CPAP in over a year   Rectal prolapse    Stage 3b chronic kidney disease (CKD) (HCC)    Trigger finger of all digits of right hand     Past Surgical History:  Procedure Laterality Date   ABDOMINAL HYSTERECTOMY     Fibroids   BACK SURGERY     L4,L5 discectomy   CARDIAC CATHETERIZATION     CARPAL TUNNEL RELEASE Right 01/18/2017   COLONOSCOPY WITH PROPOFOL N/A 06/17/2021   Procedure: COLONOSCOPY WITH PROPOFOL;  Surgeon: Wilford Corner, MD;  Location: [facility];  Service: Endoscopy;  Laterality: N/A;   ESOPHAGOGASTRODUODENOSCOPY (EGD) WITH PROPOFOL N/A 06/17/2021   Procedure: ESOPHAGOGASTRODUODENOSCOPY (EGD) WITH PROPOFOL;  Surgeon: Wilford Corner, MD;  Location: [facility];  Service: Endoscopy;  Laterality: N/A;   KIDNEY SURGERY  1998   Right.  growth removed   POLYPECTOMY  06/17/2021   Procedure: POLYPECTOMY;  Surgeon: Wilford Corner, MD;  Location: [facility];  Service: Endoscopy;;   rectal prolapse repair  12/05/2011   ROTATOR CUFF REPAIR     Left   SHOULDER ARTHROSCOPY WITH SUBACROMIAL DECOMPRESSION, ROTATOR CUFF REPAIR AND BICEP TENDON REPAIR Left 05/25/2015   Procedure: SHOULDER ARTHROSCOPY WITH SUBACROMIAL DECOMPRESSION, ROTATOR CUFF  REPAIR AND BICEP TENODESIS, DEBRIDEMENT. ;  Surgeon: Meredith Pel, MD;  Location: [place];  Service: Orthopedics;  Laterality: Left;  LEFT SHOULDER ROTATOR CUFF TEAR REPAIR, ARTHROSCOPY, DEBRIDEMENT, BICEPS TENODESIS, SUBACROMIAL DECOMPRESSION.   TOTAL ABDOMINAL HYSTERECTOMY     TRIGGER FINGER RELEASE Right 04/30/2018   Procedure: RELEASE TRIGGER FINGER/A-1 PULLEY RIGHT MIDDLE   AND SMALLn ;  Surgeon: Daryll Brod, MD;  Location: [facility];  Service: Orthopedics;  Laterality: Right;  FAB, Bier block   TUBAL LIGATION      Prior to Admission medications   Medication Sig Start Date End Date Taking? Authorizing Provider  acetaminophen (TYLENOL) 500 MG tablet Take 500 mg by mouth every 6 (six) hours as needed for moderate pain.    [provider]  Cinnamon 500 MG capsule Take 500 mg by mouth daily.    [provider]  donepezil (ARICEPT) 10 MG tablet Take 10 mg by mouth at bedtime. 03/22/22   [provider]  Ferrous Sulfate (IRON) 325 (65 Fe) MG TABS Take 1 tablet (325 mg total) by mouth daily. 06/23/21   [name], DO  losartan (COZAAR) 100 MG tablet Take 100 mg by mouth every morning. 11/30/21   [provider]  meloxicam (MOBIC) 15 MG tablet Take 15 mg by mouth daily. 07/11/22   [provider]  methocarbamol (ROBAXIN) 500 MG tablet Take 1 tablet (500 mg total) by mouth every 8 (eight) hours as needed for muscle spasms. 06/23/21   [name], DO  Polyethyl Glycol-Propyl Glycol 0.4-0.3 % SOLN Place 1 drop into both eyes daily.    [provider]  pravastatin (PRAVACHOL) 40 MG tablet Take 40 mg by mouth at bedtime.    [provider]    Current Facility-Administered Medications  Medication Dose Route Frequency Provider Last Rate Last Admin   0.9 %  sodium chloride infusion   Intravenous Continuous Opyd, Ilene Qua, MD 75 mL/hr at 07/23/22 0328 Infusion Verify at 07/23/22 3016   acetaminophen (TYLENOL) tablet 650 mg  650 mg Oral Q6H PRN Opyd, Ilene Qua, MD       Or   acetaminophen (TYLENOL) suppository 650 mg  650 mg Rectal Q6H PRN Opyd, Ilene Qua, MD       donepezil (ARICEPT) tablet 10 mg  10 mg Oral QHS Kc, Ramesh, MD       methocarbamol (ROBAXIN) tablet 500 mg  500 mg Oral Q8H PRN Kc, Ramesh, MD       ondansetron (ZOFRAN) tablet 4 mg  4 mg Oral Q6H PRN Opyd, Ilene Qua, MD       Or    ondansetron (ZOFRAN) injection 4 mg  4 mg Intravenous Q6H PRN Opyd, Ilene Qua, MD       pantoprazole (PROTONIX) injection 40 mg  40 mg Intravenous Q24H Ronnette Juniper, MD       polyvinyl alcohol (LIQUIFILM TEARS) 1.4 % ophthalmic solution 1 drop  1 drop Both Eyes Daily Kc, Ramesh, MD       pravastatin (PRAVACHOL) tablet 40 mg  40 mg Oral QHS Kc, Ramesh, MD       sodium chloride flush (NS) 0.9 % injection 3 mL  3 mL Intravenous Q12H Opyd, Ilene Qua, MD   3 mL at 07/23/22 0310    Allergies as of 07/23/2022 - Review Complete 07/23/2022  Allergen Reaction Noted   Morphine and related Anaphylaxis 07/25/2011    Family History  Problem Relation Age of Onset   Heart disease  Mother    Throat cancer Father    Cancer Father        stomach   Kidney cancer Brother    Heart disease Brother     Social History   Socioeconomic History   Marital status: Married    Spouse name: [name]   Number of children: 4   Years of education: 14   Highest education level: Not on file  Occupational History   Occupation: retired. prev worked at [employer].  Tobacco Use   Smoking status: Former    Types: Cigarettes    Quit date: 11/13/1980    Years since quitting: 41.7   Smokeless tobacco: Never   Tobacco comments:    quit in 1982  Vaping Use   Vaping Use: Never used  Substance and Sexual Activity   Alcohol use: No    Alcohol/week: 0.0 standard drinks of alcohol   Drug use: No   Sexual activity: Yes  Other Topics Concern   Not on file  Social History Narrative   21 st January 2014 , patient underwent PS and MSLT - MSLT had one  SREMs and an average time to fall asleep of  *.8 minutes , her Ewort is 20 points,: facit: this patient has severe hypersomnia and is at risk when driving. medication in form ogf nuvigil smaples had been dispensed to her but she ha not yet taken it. Her  since SREM onset is raising the suspecion  of narcolepsy with her clinical symptoms of her EDS , score  and vivid  dreams,  sleep hallucinations and dream intrusion,  and reported cataplexy . In detail -discussion of diagnosis and treatment  takes place today , first with nuvigil and if insufficient, with XYREM. Her CPAP treats her OSA very well, and she uses it 6 hours  or more each night,  residual AHI of 1.1  would not allow for OSA to be still explaining this degree of sleepiness. 2 downloads were reviewed. labs reviewed.    Patient is married [name]) and lives at home with her husband and grandchild.   Patient has four children   Patient is retired.   Patient has a college education.   Patient is right-handed.   Patient does not drink any caffeine.      Social Determinants of Health   Financial Resource Strain: Not on file  Food Insecurity: Not on file  Transportation Needs: Not on file  Physical Activity: Not on file  Stress: Not on file  Social Connections: Not on file  Intimate Partner Violence: Not on file    Review of Systems: Positive for: GI: Described in detail in HPI.    Gen:  Involuntary weight loss, denies any fever, chills, rigors, night sweats, anorexia, fatigue, weakness, malaise and sleep disorder CV: Denies chest pain, angina, palpitations, syncope, orthopnea, PND, peripheral edema, and claudication. Resp: Denies dyspnea, cough, sputum, wheezing, coughing up blood. GU : Denies urinary burning, blood in urine, urinary frequency, urinary hesitancy, nocturnal urination, and urinary incontinence. MS: Denies joint pain or swelling.  Denies muscle weakness, cramps, atrophy.  Derm: Denies rash, itching, oral ulcerations, hives, unhealing ulcers.  Psych: Denies depression, anxiety, memory loss, suicidal ideation, hallucinations,  and confusion. Heme: Denies bruising, bleeding, and enlarged lymph nodes. Neuro:  Denies any headaches, dizziness, paresthesias. Endo:  Denies any problems with DM, thyroid, adrenal function.  Physical Exam: Vital signs in last 24 hours: Temp:  [97.6 F (36.4  C)-98 F (36.7 C)] 97.8 F (36.6 C) (  09/10 0713) Pulse Rate:  [51-71] 71 (09/10 0713) Resp:  [12-25] 18 (09/10 0713) BP: (118-155)/(53-70) 118/64 (09/10 0713) SpO2:  [96 %-100 %] 100 % (09/10 0713) Weight:  [54.4 kg] 54.4 kg (09/10 0247) Last BM Date : 07/23/22  General:   Alert,  Well-developed, well-nourished, pleasant and cooperative in NAD Head:  Normocephalic and atraumatic. Eyes:  Sclera clear, no icterus.   Prominent pallor  Ears:  Normal auditory acuity. Nose:  No deformity, discharge,  or lesions. Mouth:  No deformity or lesions.  Oropharynx pink & moist. Neck:  Supple; no masses or thyromegaly. Lungs:  Clear throughout to auscultation.   No wheezes, crackles, or rhonchi. No acute distress. Heart:  Regular rate and rhythm; no murmurs, clicks, rubs,  or gallops. Extremities:  Without clubbing or edema. Neurologic:  Alert and  oriented x4;  grossly normal neurologically. Skin:  Intact without significant lesions or rashes. Psych:  Alert and cooperative. Normal mood and affect. Abdomen:  Soft, nontender and nondistended. No masses, hepatosplenomegaly or hernias noted. Normal bowel sounds, without guarding, and without rebound.      Rectal exam (performed in presence of patient's nurse [facility]): No external hemorrhoids, decreased anal sphincter tone, gloved finger stained with maroon blood   Lab Results: Recent Labs    07/23/22 0035  WBC 7.0  HGB 10.2*  HCT 32.0*  PLT 257   BMET Recent Labs    07/23/22 0035  NA 143  K 4.8  CL 115*  CO2 22  GLUCOSE 137*  BUN 30*  CREATININE 1.85*  CALCIUM 9.6   LFT Recent Labs    07/23/22 0035  PROT 7.7  ALBUMIN 4.3  AST 17  ALT 10  ALKPHOS 63  BILITOT 0.4   PT/INR Recent Labs    07/23/22 0107  LABPROT 15.6*  INR 1.3*    Studies/Results: No results found.  Impression: Painless hematochezia with history of pandiverticulosis, highly suspicious for diverticular bleed  Hemoglobin 11.2 in 06/07/2022 and 10.2 on  admission No further bowel movements since being admitted to the floor Hemodynamically stable  BUN 30/creatinine 1.85/GFR 27 compatible with chronic kidney disease Iron profile from 06/07/2022 unremarkable CT angio from 8/22: Diffuse colonic diverticulosis   Plan: We will start patient on clear liquid diet We will start patient on Metamucil 1 packet twice a day, patient will likely have passage of dark/maroon stools. However, if she has bright red blood or large hematochezia or hemodynamic instability, recommend stat NM GI blood loss scan and if positive, recommend CT angio of the abdomen pelvis and IR evaluation for possible embolization. We will discontinue Protonix IV drip and change to Protonix once a day as suspicion for upper GI bleed is extremely low.   LOS: 0 days   Ronnette Juniper, MD  07/23/2022, 8:44 AM

## 2022-07-23 NOTE — H&P (Signed)
History and Physical    Stephanie Barber KXF:[id number] DOB: Aug 28, 1941 DOA: 07/23/2022  PCP: [name], MD   Patient coming from: Home   Chief Complaint: Rectal bleeding   HPI: Stephanie Barber is a pleasant 81 y.o. 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  Patient states that she was in her usual state of health on 07/20/2022 when she experienced an urgent need to move her bowels and passed what she describes as a large volume of reddish-maroon stool.  She passed a few clots the following day but seemed to be doing better until she had another episode yesterday where she felt the need to get to the toilet immediately to move her bowels and passed some dark stool and blood.    She attributes the dark stool to being back on iron supplementation recently.  She states that the symptoms are very similar to what she has had multiple times in the past.  She does not take any antiplatelet, anticoagulation, or NSAID.  She denies any abdominal pain, nausea, or vomiting.  ED Course: Upon arrival to the ED, patient is found to be afebrile and saturating well on room air with normal heart rate and blood pressure.  BUN is 30 and creatinine 1.85.  Hemoglobin is 10.2.  Patient was given a liter of normal saline, 80 mg IV Protonix, and she was started on Protonix infusion.   Review of Systems:  All other systems reviewed and apart from HPI, are negative.  Past Medical History:  Diagnosis Date   Arthritis    Chronic headache    Complication of anesthesia    allergy to Novocaine    Diabetes mellitus    GERD (gastroesophageal reflux disease)    GI bleed    Heart murmur    High cholesterol    Hyperlipidemia    Hypertension    Intracranial atherosclerosis    per MRA   Migraine    Narcolepsy and cataplexy    OSA on CPAP     states has not used CPAP in over a year   Rectal prolapse    Stage 3b chronic kidney disease (CKD) (HCC)    Trigger finger of all digits of right hand     Past Surgical History:  Procedure Laterality Date   ABDOMINAL HYSTERECTOMY     Fibroids   BACK SURGERY     L4,L5 discectomy   CARDIAC CATHETERIZATION     CARPAL TUNNEL RELEASE Right 01/18/2017   COLONOSCOPY WITH PROPOFOL N/A 06/17/2021   Procedure: COLONOSCOPY WITH PROPOFOL;  Surgeon: Wilford Corner, MD;  [facility];  Service: Endoscopy;  Laterality: N/A;   ESOPHAGOGASTRODUODENOSCOPY (EGD) WITH PROPOFOL N/A 06/17/2021   Procedure: ESOPHAGOGASTRODUODENOSCOPY (EGD) WITH PROPOFOL;  Surgeon: Wilford Corner, MD;  [facility];  Service: Endoscopy;  Laterality: N/A;   KIDNEY SURGERY  1998   Right.  growth removed   POLYPECTOMY  06/17/2021   Procedure: POLYPECTOMY;  Surgeon: Wilford Corner, MD;  [facility];  Service: Endoscopy;;   rectal prolapse repair  12/05/2011   ROTATOR CUFF REPAIR     Left   SHOULDER ARTHROSCOPY WITH SUBACROMIAL DECOMPRESSION, ROTATOR CUFF REPAIR AND BICEP TENDON REPAIR Left 05/25/2015   Procedure: SHOULDER ARTHROSCOPY WITH SUBACROMIAL DECOMPRESSION, ROTATOR CUFF REPAIR AND BICEP TENODESIS, DEBRIDEMENT. ;  Surgeon: Meredith Pel, MD;  Location: [place];  Service: Orthopedics;  Laterality: Left;  LEFT SHOULDER ROTATOR CUFF TEAR REPAIR, ARTHROSCOPY, DEBRIDEMENT, BICEPS TENODESIS, SUBACROMIAL DECOMPRESSION.   TOTAL ABDOMINAL HYSTERECTOMY     TRIGGER FINGER RELEASE Right 04/30/2018   Procedure: RELEASE TRIGGER FINGER/A-1 PULLEY RIGHT MIDDLE  AND SMALLn ;  Surgeon: Daryll Brod, MD;  Location: [facility];  Service: Orthopedics;  Laterality: Right;  FAB, Bier block   TUBAL LIGATION      Social History:   reports that she quit smoking about 41 years ago. Her smoking use included cigarettes. She has never used smokeless tobacco. She reports that she does not drink  alcohol and does not use drugs.  Allergies  Allergen Reactions   Morphine And Related Anaphylaxis    Family History  Problem Relation Age of Onset   Heart disease Mother    Throat cancer Father    Cancer Father        stomach   Kidney cancer Brother    Heart disease Brother      Prior to Admission medications   Medication Sig Start Date End Date Taking? Authorizing Provider  acetaminophen (TYLENOL) 500 MG tablet Take 500 mg by mouth every 6 (six) hours as needed for moderate pain.    [provider]  Cinnamon 500 MG capsule Take 500 mg by mouth daily.    [provider]  donepezil (ARICEPT) 10 MG tablet Take 10 mg by mouth at bedtime. 03/22/22   [provider]  Ferrous Sulfate (IRON) 325 (65 Fe) MG TABS Take 1 tablet (325 mg total) by mouth daily. 06/23/21   [name], DO  losartan (COZAAR) 100 MG tablet Take 100 mg by mouth every morning. 11/30/21   [provider]  methocarbamol (ROBAXIN) 500 MG tablet Take 1 tablet (500 mg total) by mouth every 8 (eight) hours as needed for muscle spasms. 06/23/21   [name], DO  Polyethyl Glycol-Propyl Glycol 0.4-0.3 % SOLN Place 1 drop into both eyes daily.    [provider]  pravastatin (PRAVACHOL) 40 MG tablet Take 40 mg by mouth at bedtime.    [provider]    Physical Exam: Vitals:   07/23/22 0100 07/23/22 0130 07/23/22 0145 07/23/22 0200  BP: 137/70 136/62 (!) 135/57 (!) 138/56  Pulse: 60 (!) 51 (!) 56 (!) 58  Resp: (!) '25 12 15 12  '$ Temp: 97.6 F (36.4 C)   97.6 F (36.4 C)  TempSrc: Oral   Oral  SpO2: 96% 98% 97% 97%    Constitutional: NAD, calm  Eyes: PERTLA, lids and conjunctivae normal ENMT: Mucous membranes are moist. Posterior pharynx clear of any exudate or lesions.   Neck: supple, no masses  Respiratory: no wheezing, no crackles. No accessory muscle use.  Cardiovascular: S1 & S2 heard, regular rate and rhythm. No extremity edema.   Abdomen: No  distension, no tenderness, soft. Bowel sounds active.  Musculoskeletal: no clubbing / cyanosis. No joint deformity upper and lower extremities.   Skin: no significant rashes, lesions, ulcers. Warm, dry, well-perfused. Neurologic: CN 2-12 grossly intact. Moving all extremities. Alert and oriented.  Psychiatric: Pleasant. Cooperative.    Labs and Imaging on Admission: I have personally reviewed following labs and imaging studies  CBC: Recent Labs  Lab 07/23/22 0035  WBC 7.0  HGB 10.2*  HCT 32.0*  MCV 95.5  PLT 916   Basic Metabolic Panel: Recent Labs  Lab 07/23/22 0035  NA 143  K 4.8  CL 115*  CO2 22  GLUCOSE 137*  BUN  30*  CREATININE 1.85*  CALCIUM 9.6   GFR: CrCl cannot be calculated (Unknown ideal weight.). Liver Function Tests: Recent Labs  Lab 07/23/22 0035  AST 17  ALT 10  ALKPHOS 63  BILITOT 0.4  PROT 7.7  ALBUMIN 4.3   No results for input(s): "LIPASE", "AMYLASE" in the last 168 hours. No results for input(s): "AMMONIA" in the last 168 hours. Coagulation Profile: Recent Labs  Lab 07/23/22 0107  INR 1.3*   Cardiac Enzymes: No results for input(s): "CKTOTAL", "CKMB", "CKMBINDEX", "TROPONINI" in the last 168 hours. BNP (last 3 results) No results for input(s): "PROBNP" in the last 8760 hours. HbA1C: No results for input(s): "HGBA1C" in the last 72 hours. CBG: Recent Labs  Lab 07/23/22 0052  GLUCAP 143*   Lipid Profile: No results for input(s): "CHOL", "HDL", "LDLCALC", "TRIG", "CHOLHDL", "LDLDIRECT" in the last 72 hours. Thyroid Function Tests: No results for input(s): "TSH", "T4TOTAL", "FREET4", "T3FREE", "THYROIDAB" in the last 72 hours. Anemia Panel: No results for input(s): "VITAMINB12", "FOLATE", "FERRITIN", "TIBC", "IRON", "RETICCTPCT" in the last 72 hours. Urine analysis:    Component Value Date/Time   COLORURINE YELLOW 04/08/2022 1411   APPEARANCEUR CLEAR 04/08/2022 1411   LABSPEC 1.016 04/08/2022 1411   PHURINE 5.0 04/08/2022  1411   GLUCOSEU NEGATIVE 04/08/2022 1411   [place] 04/08/2022 1411   BILIRUBINUR NEGATIVE 04/08/2022 1411   KETONESUR NEGATIVE 04/08/2022 1411   PROTEINUR NEGATIVE 04/08/2022 1411   UROBILINOGEN 0.2 05/20/2009 1405   NITRITE NEGATIVE 04/08/2022 1411   LEUKOCYTESUR TRACE (A) 04/08/2022 1411   Sepsis Labs: '@LABRCNTIP'$ (procalcitonin:4,lacticidven:4) )No results found for this or any previous visit (from the past 240 hour(s)).   Radiological Exams on Admission: No results found.  Assessment/Plan  1. Acute GI bleeding  - Patient describes having multiple episodes of painless hematochezia; ED PA reports finding melanotic stool on DRE  - She is hemodynamically stable with initial Hgb 10.2 (11.2 in late July 2023)  - IV PPI was started in ED  - Continue IV PPI for now, trend H&H, transfuse if needed, discuss with GI in am    2. Hypertension  - She has acute GIB, will treat hypertension as-needed only for now   3. CKD IIIb  - SCr is 1.85 on admission, up from 1.66 in May 2023  - Hold ARB for now, renally-dose medications, monitor    4. OSA  - CPAP qHS    DVT prophylaxis: SCDs  Code Status: Full  Level of Care: Level of care: Telemetry Family Communication: Husband at bedside  Disposition Plan:  Patient is from: home  Anticipated d/c is to: home  Anticipated d/c date is: Possibly as early as 07/24/22  Patient currently: pending stable H&H, GI consultation  Consults called: ED PA sent message to Dr Therisa Doyne of [facility] with request for routine consult Admission status: Observation     Vianne Bulls, MD [facility]  07/23/2022, 2:36 AM

## 2022-07-23 NOTE — Progress Notes (Signed)
Pt currently non-compliant with CPAP QHS.

## 2022-07-23 NOTE — Progress Notes (Signed)
Per Dr. Therisa Doyne, "Please fu on her CTA result. Dr. Watt Climes is on call for GI after 7 pm and needs to be notified if result is positive for active bleeding and IR consult stat will be needed at that time." HS RN will be notified of this in report

## 2022-07-23 NOTE — Progress Notes (Addendum)
Pt had an large amount of medium to dark bloody stool. While ambulating back to bed from restroom, pt became notably weak, & stated feeling slightly dizzy. H & H has decreased from 10.2 & 32 since midnight to 8.5 & 26.6. Notified Dr. Therisa Doyne. Who ordered an NM of stool. Also notified Dr. Maren Beach, attending.

## 2022-07-23 NOTE — Progress Notes (Signed)
Patient seen and examined personally, I reviewed the chart, history and physical and admission note, done by admitting physician this morning and agree with the same with following addendum.  Please refer to the morning admission note for more detailed plan of care.  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 She attributed the dark stool to being back on iron supplementation recently.  Had multiple similar episodes in the past. In BS:[id number] and saturating well on RA.BUN is 30 and creatinine 1.85.  Hemoglobin is 10.2,given a liter of normal saline, 80 mg IV Protonix>Protonix infusion, [facility] consulted and admitted   issues Acute GI bleeding Hematochezia/melanotic stool: Acute on chronic normocytic anemia-suspect acute blood loss anemia: Baseline hemoglobin 11 to 12 g in July.  monitor serial h/h, avoid NSAIDs (on Mobic at home) /aspirin/anticoagulant.  Appreciate GI input starting clear liquid diet, Metamucil if has bright red blood or large hematochezia or hemodynamic instability will need to start [place] and if NM is positive will need CT angio of the abdomen and pelvis and IR evaluation for possible embolization.  Discontinue tPA infusion given low suspicion for upper GI bleeding.  HTN HLD: Hold antihypertensives-losartan.  Continue pravastatin  CKD stage IIIb baseline creatinine around 1.6 monitor holding losartan OSA:- CPAP qHS Cont her Aricept  POC discussed with patient and husband at the bedside.

## 2022-07-23 NOTE — Progress Notes (Signed)
Third large bloody stool, mostly blood with significant clots.

## 2022-07-23 NOTE — Progress Notes (Signed)
Called Nuclear Med Tech, [place] on call for pt's STAT order for NM blood loss. Stated he cannot do the scan until morning, even though ordered STAT. Informed him pt's has lost a significant amount of blood. Notified Dr. Maren Beach, attending, & Dr. Therisa Doyne GI ordering physician.

## 2022-07-23 NOTE — Progress Notes (Signed)
Patient continues to have BRBPR and Hgb is down to 7.9 from 10.2 on admission.   She is hemodynamically stable but feeling more weak and lightheaded.   She just had CTA with no active bleeding identified.   Plan to transfuse 1 unit RBC now and continue close monitoring.

## 2022-07-23 NOTE — Progress Notes (Addendum)
Stephanie Barber and Dr Antionette Char made aware of the following situation:  Patient complaining of dizziness and weakness. PT currently shaking. A&O. Current vitals HR 77 BP 144/72 RR 16. Per day shift report patient had three large bloody Bms during the day. PT had an additional large Bloody BM at 1945. PT currently on the bedpan for additional BM. HGB trending downward. Current HGB 7.9 and previous 8.5. Patient placed on MX monitor to closer monitoring. Dr also made aware of CT scan results  Per [facility] one unit PRBCs ordered

## 2022-07-24 ENCOUNTER — Observation Stay (HOSPITAL_COMMUNITY): Payer: Medicare Other

## 2022-07-24 DIAGNOSIS — D62 Acute posthemorrhagic anemia: Secondary | ICD-10-CM

## 2022-07-24 DIAGNOSIS — Z8719 Personal history of other diseases of the digestive system: Secondary | ICD-10-CM | POA: Diagnosis not present

## 2022-07-24 DIAGNOSIS — K922 Gastrointestinal hemorrhage, unspecified: Secondary | ICD-10-CM | POA: Diagnosis not present

## 2022-07-24 LAB — GLUCOSE, CAPILLARY
Glucose-Capillary: 169 mg/dL — ABNORMAL HIGH (ref 70–99)
Glucose-Capillary: 106 mg/dL — ABNORMAL HIGH (ref 70–99)
Glucose-Capillary: 74 mg/dL (ref 70–99)
Glucose-Capillary: 152 mg/dL — ABNORMAL HIGH (ref 70–99)
Glucose-Capillary: 84 mg/dL (ref 70–99)

## 2022-07-24 LAB — MRSA NEXT GEN BY PCR, NASAL: MRSA by PCR Next Gen: NOT DETECTED

## 2022-07-24 LAB — BASIC METABOLIC PANEL
Anion gap: 5 (ref 5–15)
BUN: 16 mg/dL (ref 8–23)
CO2: 16 mmol/L — ABNORMAL LOW (ref 22–32)
Calcium: 8.3 mg/dL — ABNORMAL LOW (ref 8.9–10.3)
Chloride: 119 mmol/L — ABNORMAL HIGH (ref 98–111)
Creatinine, Ser: 1.44 mg/dL — ABNORMAL HIGH (ref 0.44–1.00)
GFR, Estimated: 36 mL/min — ABNORMAL LOW (ref >60)
Glucose, Bld: 203 mg/dL — ABNORMAL HIGH (ref 70–99)
Potassium: 4.1 mmol/L (ref 3.5–5.1)
Sodium: 140 mmol/L (ref 135–145)

## 2022-07-24 LAB — HEMOGLOBIN AND HEMATOCRIT, BLOOD
HCT: 24.8 % — ABNORMAL LOW (ref 36.0–46.0)
HCT: 24.6 % — ABNORMAL LOW (ref 36.0–46.0)
HCT: 20.7 % — ABNORMAL LOW (ref 36.0–46.0)
Hemoglobin: 7.8 g/dL — ABNORMAL LOW (ref 12.0–15.0)
Hemoglobin: 8 g/dL — ABNORMAL LOW (ref 12.0–15.0)
Hemoglobin: 6.5 g/dL — CL (ref 12.0–15.0)

## 2022-07-24 LAB — PREPARE RBC (CROSSMATCH)

## 2022-07-24 MED ORDER — SODIUM CHLORIDE 0.9% IV SOLUTION: Freq: Once | INTRAVENOUS | Status: AC

## 2022-07-24 MED ORDER — ORAL CARE MOUTH RINSE
15.0000 mL | OROMUCOSAL | Status: DC
  Administered 2022-07-24 – 2022-07-26 (×8): 15 mL via OROMUCOSAL

## 2022-07-24 MED ORDER — ORAL CARE MOUTH RINSE: 15.0000 mL | OROMUCOSAL | Status: DC | PRN

## 2022-07-24 MED ORDER — CHLORHEXIDINE GLUCONATE CLOTH 2 % EX PADS
6.0000 | MEDICATED_PAD | Freq: Every day | CUTANEOUS | Status: DC
  Administered 2022-07-24 – 2022-07-28 (×5): 6 via TOPICAL

## 2022-07-24 MED ORDER — TECHNETIUM TC 99M-LABELED RED BLOOD CELLS IV KIT
22.6000 | PACK | Freq: Once | INTRAVENOUS | Status: AC | PRN
  Administered 2022-07-24: 22.6 via INTRAVENOUS

## 2022-07-24 MED ORDER — FUROSEMIDE 10 MG/ML IJ SOLN: 20.0000 mg | INTRAMUSCULAR | Status: DC | PRN

## 2022-07-24 NOTE — Plan of Care (Signed)

## 2022-07-24 NOTE — Progress Notes (Signed)
Pt transfer to ICU; got report from previous RN. Pt alert and able to follow command; oriented to room, husband at bed side.

## 2022-07-24 NOTE — Progress Notes (Signed)
0300: PT had a large bloody BM with clots. After removing bedpan and pulling patient up in bed PT stated " I feel like I am going to pass out" PT became unresponsive for approximately 30-45 seconds. HR and respirations maintained. Charge RN called by NT while RN stayed at bedside. PT regained conciseness  and was oriented x4. PT states " I dont feel good" BP cycled and BP 133/67 HR 71.  Blood sugar 169. Dr Myna Hidalgo contacted via [facility] and discussed the situation with Dr Myna Hidalgo. Rapid response also called and ICU RN at bedside. Nasal cannula at 2 liters/min placed for comfort. STAT HBG ordered and patient transferred to ICU.

## 2022-07-24 NOTE — TOC Initial Note (Signed)
Transition of Care [facility]) - Initial/Assessment Note    Patient Details  Name: Stephanie Barber MRN: [id number] Date of Birth: [birth year]  Transition of Care [facility]) CM/SW Contact:    Leeroy Cha, RN Phone Number: 07/24/2022, 8:33 AM  Clinical Narrative:                  Transition of Care [facility]) Screening Note   Patient Details  Name: Stephanie Barber Date of Birth: Apr 25, 1941   Transition of Care [facility]) CM/SW Contact:    Leeroy Cha, RN Phone Number: 07/24/2022, 8:33 AM    Transition of Care Department [facility]) has reviewed patient and no TOC needs have been identified at this time. We will continue to monitor patient advancement through interdisciplinary progression rounds. If new patient transition needs arise, please place a TOC consult.    Expected Discharge Plan: Home/Self Care Barriers to Discharge: Continued Medical Work up   Patient Goals and CMS Choice   CMS Medicare.gov Compare Post Acute Care list provided to:: Patient    Expected Discharge Plan and Services Expected Discharge Plan: Home/Self Care   Discharge Planning Services: CM Consult   Living arrangements for the past 2 months: Single Family Home                                      Prior Living Arrangements/Services Living arrangements for the past 2 months: Single Family Home Lives with:: Spouse Patient language and need for interpreter reviewed:: Yes Do you feel safe going back to the place where you live?: Yes            Criminal Activity/Legal Involvement Pertinent to Current Situation/Hospitalization: No - Comment as needed  Activities of Daily Living Home Assistive Devices/Equipment: Eyeglasses ADL Screening (condition at time of admission) Patient's cognitive ability adequate to safely complete daily activities?: Yes Is the patient deaf or have difficulty hearing?: No Does the patient have difficulty seeing, even when wearing glasses/contacts?: No Does the  patient have difficulty concentrating, remembering, or making decisions?: No Patient able to express need for assistance with ADLs?: Yes Does the patient have difficulty dressing or bathing?: No Independently performs ADLs?: Yes (appropriate for developmental age) Does the patient have difficulty walking or climbing stairs?: No Weakness of Legs: Both Weakness of Arms/Hands: None  Permission Sought/Granted                  Emotional Assessment Appearance:: Appears stated age Attitude/Demeanor/Rapport: Engaged Affect (typically observed): Calm Orientation: : Oriented to Self, Oriented to Place, Oriented to  Time, Oriented to Situation Alcohol / Substance Use: Tobacco Use (quit 41 years ago no alcohol and no drug) Psych Involvement: No (comment)  Admission diagnosis:  GI bleeding [K92.2] Acute GI bleeding [K92.2] Patient Active Problem List   Diagnosis Date Noted   GI bleeding 07/23/2022   Stage 3b chronic kidney disease (CKD) ([place])    Other spondylosis with radiculopathy, lumbar region 09/19/2021   IDA (iron deficiency anemia) 07/06/2021   Protein-calorie malnutrition, severe 06/21/2021   ABLA (acute blood loss anemia)    Malnutrition of moderate degree 06/16/2021   Hematochezia 06/15/2021   Acute GI bleeding 06/15/2021   Hyperlipidemia associated with type 2 diabetes mellitus ([place]) 06/15/2021   Left-sided headache 06/30/2019   Memory loss 06/30/2019   AKI (acute kidney injury) ([place]) 05/06/2017   Dehydration 05/06/2017   Chest pain 05/06/2017   Wound infection  after surgery 01/30/2017   Palpitations 08/24/2016   Noncompliance with CPAP treatment 12/28/2015   Poor compliance with CPAP treatment 10/26/2014   CPAP use counseling 07/28/2014   OSA on CPAP 07/28/2014   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   Hypersomnolent 04/02/2014   Obstructive sleep apnea 04/02/2014   Non-compliance with treatment 03/05/2014   Type 2 diabetes mellitus ([place]) 12/05/2011   Hypertension  12/05/2011   GERD (gastroesophageal reflux disease)    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   Chronic cough 07/26/2011   PCP:  [name], MD Pharmacy:  No Pharmacies Listed    Social Determinants of Health (SDOH) Interventions    Readmission Risk Interventions   No data to display

## 2022-07-24 NOTE — Progress Notes (Signed)
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 She attributed the dark stool to being back on iron supplementation recently.  Had multiple similar episodes in the past. In IO:[id number] and saturating well on RA.BUN is 30 and creatinine 1.85.  Hemoglobin is 10.2,given a liter of normal saline, 80 mg IV Protonix>Protonix infusion, [facility] consulted and admitted.  9/10:Seen by GI placed on liquid diet but patient had large bowel movement, + has also dropped 10.2 to 8.5 g twice daily scan was ordered> changed to CT angio:VASCULAR No CT findings for active gastrointestinal hemorrhage     Subjective: Seen and examined this morning currently feels well alert awake.  Husband at the bedside Overnight ongoing bleeding-large BM, episode of unresponsiveness 30-45 seconds>transferred to stepdown Vitals stable-afebrile overnight H&H this morning 8.0 Somewhat upset about Metamucil   Assessment and Plan: Principal Problem:   GI bleeding Active Problems:   Hypertension   OSA on CPAP   ABLA (acute blood loss anemia)   Stage 3b chronic kidney disease (CKD) (H[facility])   Acute GI bleeding-Hematochezia/melanotic stool: Acute on chronic normocytic anemia Acute blood loss anemia: Baseline hemoglobin 11 to 12 g in July.Most likely lower GI bleeding. S/p 1 unit PRBC 9/10.  Has been holding in 8 g range.  CT angio overnight no active bleeding noted> transferred to stepdown.  Remains hemodynamically stable.  Has had multiple bowel movements. Continue to monitor in stepdown, monitor serial H&H.  Await further GI plan. NM scan pending this am Recent Labs  Lab 07/23/22 0035 07/23/22 1424  07/23/22 1935 07/24/22 0340 07/24/22 0627  HGB 10.2* 8.5* 7.9* 7.8* 8.0*  HCT 32.0* 26.6* 24.5* 24.8* 24.6*    HTN HLD: Hold antihypertensives-losartan.Continue pravastatin   CKD stage IIIb baseline creatinine around 1.4-1.6.stable.  Continue to monitor.  Recent Labs  Lab 07/23/22 0035 07/23/22 1424 07/24/22 0340  BUN 30* 21 16  CREATININE 1.85* 1.43* 1.44*  OSA:- CPAP qhs Memory issues:cont her Aricept  DVT prophylaxis: SCDs Start: 07/23/22 0236 Code Status:   Code Status: Full Code Family Communication: plan of care discussed with patient/husband at bedside.  Patient status is: inpatient  because of active GI bleeding Level of care: Stepdown  Dispo: The patient is from: home with husband            Anticipated disposition: TBD  Mobility Assessment (last 72 hours)     Mobility Assessment     Row Name 07/23/22 2000 07/23/22 0720 07/23/22 0250       Does patient have an order for bedrest or is patient medically unstable No - Continue assessment Yes- Bedfast (Level 1) - Complete No - Continue assessment     What is the highest level of mobility based on the progressive mobility assessment? Level 4 (Walks with assist in room) - Balance while marching in place and cannot step forward and back - Complete Level 4 (Walks with assist in room) - Balance while marching in place and cannot step forward and back - Complete Level 4 (Walks with assist in room) - Balance while marching in place and cannot step forward and back - Complete               Objective: Vitals last  24 hrs: Vitals:   07/24/22 0900 07/24/22 0930 07/24/22 1000 07/24/22 1100  BP: (!) 133/57 (!) 128/45 (!) 141/56 (!) 138/45  Pulse: 67 65 76 64  Resp: 19 (!) '21 17 10  '$ Temp:      TempSrc:      SpO2: 100% 100% 100% 100%  Weight:      Height:       Weight change: -1.5 kg  Physical Examination: General exam: alert awake,older than stated age, weak appearing. HEENT:Oral mucosa moist, Ear/Nose WNL  grossly, dentition normal. Respiratory system: bilaterally clear BS, no use of accessory muscle Cardiovascular system: S1 & S2 +, No JVD. Gastrointestinal system: Abdomen soft,NT,ND, BS+ Nervous System:Alert, awake, moving extremities and grossly nonfocal Extremities: LE edema neg,distal peripheral pulses palpable.  Skin: No rashes,no icterus. MSK: Normal muscle bulk,tone, power  Medications reviewed:  Scheduled Meds:  Chlorhexidine Gluconate Cloth  6 each Topical Daily   mouth rinse  15 mL Mouth Rinse 4 times per day   pantoprazole  40 mg Intravenous Daily   polyvinyl alcohol  1 drop Both Eyes Daily   pravastatin  40 mg Oral QHS   psyllium  1 packet Oral BID   sodium chloride flush  3 mL Intravenous Q12H   Continuous Infusions:  sodium chloride 100 mL/hr at 07/24/22 0841      Diet Order             Diet clear liquid Room service appropriate? Yes; Fluid consistency: Thin  Diet effective now                  Unresulted Labs (From admission, onward)     Start     Ordered   07/24/22 0000  Hemoglobin and hematocrit, blood  Now then every 6 hours,   R (with TIMED occurrences)      07/23/22 2034   07/23/22 9509  Basic metabolic panel  Daily at 5am,   R      07/23/22 0236          Data Reviewed: I have personally reviewed following labs and imaging studies CBC: Recent Labs  Lab 07/23/22 0035 07/23/22 1424 07/23/22 1935 07/24/22 0340 07/24/22 0627  WBC 7.0  --   --   --   --   HGB 10.2* 8.5* 7.9* 7.8* 8.0*  HCT 32.0* 26.6* 24.5* 24.8* 24.6*  MCV 95.5  --   --   --   --   PLT 257  --   --   --   --    Basic Metabolic Panel: Recent Labs  Lab 07/23/22 0035 07/23/22 1424 07/24/22 0340  NA 143 143 140  K 4.8 4.6 4.1  CL 115* 118* 119*  CO2 22 18* 16*  GLUCOSE 137* 127* 203*  BUN 30* 21 16  CREATININE 1.85* 1.43* 1.44*  CALCIUM 9.6 8.9 8.3*   GFR: Estimated Creatinine Clearance: 22.4 mL/min (A) (by C-G formula based on SCr of 1.44 mg/dL (H)). Liver  Function Tests: Recent Labs  Lab 07/23/22 0035  AST 17  ALT 10  ALKPHOS 63  BILITOT 0.4  PROT 7.7  ALBUMIN 4.3   No results for input(s): "LIPASE", "AMYLASE" in the last 168 hours. No results for input(s): "AMMONIA" in the last 168 hours. Coagulation Profile: Recent Labs  Lab 07/23/22 0107  INR 1.3*   BNP (last 3 results) No results for input(s): "PROBNP" in the last 8760 hours. HbA1C: No results for input(s): "HGBA1C" in the last 72 hours. CBG: Recent Labs  Lab 07/23/22 0052 07/23/22 2210 07/24/22 0305 07/24/22 0751  GLUCAP 143* 109* 169* 106*   Lipid Profile: No results for input(s): "CHOL", "HDL", "LDLCALC", "TRIG", "CHOLHDL", "LDLDIRECT" in the last 72 hours. Thyroid Function Tests: No results for input(s): "TSH", "T4TOTAL", "FREET4", "T3FREE", "THYROIDAB" in the last 72 hours. Sepsis Labs: No results for input(s): "PROCALCITON", "LATICACIDVEN" in the last 168 hours.  Recent Results (from the past 240 hour(s))  MRSA Next Gen by PCR, Nasal     Status: None   Collection Time: 07/24/22  3:49 AM   Specimen: Nasal Mucosa; Nasal Swab  Result Value Ref Range Status   MRSA by PCR Next Gen NOT DETECTED NOT DETECTED Final    Comment: (NOTE) The GeneXpert MRSA Assay (FDA approved for NASAL specimens only), is one component of a comprehensive MRSA colonization surveillance program. It is not intended to diagnose MRSA infection nor to guide or monitor treatment for MRSA infections. Test performance is not FDA approved in patients less than 74 years old. Performed at [facility], [place] [address], [place], [place] [address]     Antimicrobials: Anti-infectives (From admission, onward)    None      Culture/Microbiology    Component Value Date/Time   SDES URINE, CATHETERIZED 01/30/2017 1319   SPECREQUEST NONE 01/30/2017 1319   CULT (A) 01/30/2017 1319    20,000 COLONIES/mL PROTEUS MIRABILIS 60,000 COLONIES/mL ENTEROCOCCUS FAECALIS     REPTSTATUS 02/03/2017 FINAL 01/30/2017 1319    Radiology Studies: CT Angio Abd/Pel w/ and/or w/o  Result Date: 07/23/2022 CLINICAL DATA:  Hematochezia EXAM: CTA ABDOMEN AND PELVIS WITHOUT AND WITH CONTRAST TECHNIQUE: Multidetector CT imaging of the abdomen and pelvis was performed using the standard protocol during bolus administration of intravenous contrast. Multiplanar reconstructed images and MIPs were obtained and reviewed to evaluate the vascular anatomy. RADIATION DOSE REDUCTION: This exam was performed according to the departmental dose-optimization program which includes automated exposure control, adjustment of the mA and/or kV according to patient size and/or use of iterative reconstruction technique. CONTRAST:  32m OMNIPAQUE IOHEXOL 350 MG/ML SOLN COMPARISON:  CT 06/15/2021 FINDINGS: VASCULAR Aorta: Moderate atherosclerotic plaque.  No aneurysm or dissection. Celiac: Patent.  No aneurysm or dissection. SMA: Patent.  No aneurysm or dissection. Renals: Mild narrowing of the proximal right renal artery secondary to calcified atherosclerotic plaque. The left renal artery is patent. IMA: Patent. Inflow: Mild plaque without significant narrowing. No aneurysm or dissection. Proximal Outflow: Patent.  No aneurysm or dissection. Veins: No obvious venous abnormality within the limitations of this arterial phase study. Review of the MIP images confirms the above findings. NON-VASCULAR Lower chest: No acute abnormality. Hepatobiliary: No focal liver abnormality is seen. No gallstones, gallbladder wall thickening, or biliary dilatation. Pancreas: Unremarkable. No pancreatic ductal dilatation or surrounding inflammatory changes. Spleen: Normal in size without focal abnormality. Adrenals/Urinary Tract: Adrenal glands are unremarkable. Bilateral cortical renal scarring. Nonobstructing stones in both kidneys with the largest in the upper pole of the left kidney measuring 7 mm. Low-attenuation lesions in the kidneys  are statistically likely to represent cysts. No follow-up is required. No hydronephrosis. Unremarkable bladder for degree of distention. Stomach/Bowel: Stomach is unremarkable. Normal caliber small bowel and colon. Postoperative changes about the distal sigmoid colon/rectum. Colonic diverticulosis without diverticulitis. Appendix is unremarkable. No evidence of active contrast extravasation into the lumen of the large or small bowel. Lymphatic: No suspicious lymphadenopathy. Reproductive: Unremarkable. Other: No free intraperitoneal fluid or air. Musculoskeletal: Demineralization.  No acute fracture. IMPRESSION: VASCULAR No CT findings  for active gastrointestinal hemorrhage. Aortic Atherosclerosis (ICD10-I70.0). NON-VASCULAR No acute findings in the abdomen or pelvis. Colonic diverticulosis without acute diverticulitis. Electronically Signed   By: Placido Sou M.D.   On: 07/23/2022 19:38     LOS: 0 days   Antonieta Pert, MD [facility]  07/24/2022, 12:36 PM

## 2022-07-24 NOTE — Progress Notes (Addendum)
[facility] Gastroenterology Progress Note  Stephanie Barber 81 y.o. [birth year]  Subjective: Overnight she had multiple episodes of dark red blood in stool. Hgb dropped from 10.2 to 7.9.   She remained hemodynamically stable overnight but had an episode of syncope lasting 30-40 seconds, rapid response was called and she was transferred to the ICU. CTA overnight with no acute GI bleed. 1 unit of pRBC transfused overnight. This morning she is feeling a little better than last night. She last noticed bloody BM at 3 AM.   ROS : Review of Systems  Gastrointestinal:  Positive for blood in stool. Negative for abdominal pain, constipation, diarrhea, heartburn, melena, nausea and vomiting.  Genitourinary:  Negative for dysuria and urgency.     Objective: Vital signs in last 24 hours: Vitals:   07/24/22 0400 07/24/22 0700  BP: 110/79 (!) 156/54  Pulse: 84 87  Resp: (!) 22 17  Temp: 98 F (36.7 C)   SpO2: 98% 100%    Physical Exam:  General:  Alert, cooperative, no distress, appears stated age  Head:  Normocephalic, without obvious abnormality, atraumatic  Eyes:  Anicteric sclera, EOM's intact  Lungs:   Clear to auscultation bilaterally, respirations unlabored  Heart:  Regular rate and rhythm, S1, S2 normal  Abdomen:   Soft, non-tender, bowel sounds active all four quadrants,  no masses,   Extremities: Extremities normal, atraumatic, no  edema  Pulses: 2+ and symmetric    Lab Results: Recent Labs    07/23/22 1424 07/24/22 0340  NA 143 140  K 4.6 4.1  CL 118* 119*  CO2 18* 16*  GLUCOSE 127* 203*  BUN 21 16  CREATININE 1.43* 1.44*  CALCIUM 8.9 8.3*   Recent Labs    07/23/22 0035  AST 17  ALT 10  ALKPHOS 63  BILITOT 0.4  PROT 7.7  ALBUMIN 4.3   Recent Labs    07/23/22 0035 07/23/22 1424 07/24/22 0340 07/24/22 0627  WBC 7.0  --   --   --   HGB 10.2*   < > 7.8* 8.0*  HCT 32.0*   < > 24.8* 24.6*  MCV 95.5  --   --   --   PLT 257  --   --   --    < > = values in this  interval not displayed.   Recent Labs    07/23/22 0107  LABPROT 15.6*  INR 1.3*      Assessment Possible diverticular bleed  07/24/2022 hemoglobin 8.0(7.8), on 9/10 Hgb was 10.2.  CTA abdomen and pelvis 07/23/2022 No acute findings in the abdomen or pelvis. No CT findings for active gastrointestinal hemorrhage. Colonic diverticulosis without acute diverticulitis.  Patient with history of pandiverticulosis.  Continues to have dark red blood and clots in bowel movement.  No findings of active GI bleed on CT scan.  Possible dark red blood and clots or old blood diverticular bleed.   Plan: Continue to monitor hemoglobin transfuse if less than 7. Continue clear liquid diet We will follow results of nuclear medicine GI bleeding scan Continue pantoprazole 40 mg IV daily. Continue Metamucil 1 packet twice daily. [facility] will follow  Charlott Rakes PA-C 07/24/2022, 8:06 AM  Contact #  [phone]

## 2022-07-25 ENCOUNTER — Inpatient Hospital Stay (HOSPITAL_COMMUNITY): Payer: Medicare Other

## 2022-07-25 ENCOUNTER — Encounter (HOSPITAL_COMMUNITY): Payer: Self-pay | Admitting: Internal Medicine

## 2022-07-25 DIAGNOSIS — G4733 Obstructive sleep apnea (adult) (pediatric): Secondary | ICD-10-CM | POA: Diagnosis present

## 2022-07-25 DIAGNOSIS — Z8249 Family history of ischemic heart disease and other diseases of the circulatory system: Secondary | ICD-10-CM | POA: Diagnosis not present

## 2022-07-25 DIAGNOSIS — E7800 Pure hypercholesterolemia, unspecified: Secondary | ICD-10-CM | POA: Diagnosis present

## 2022-07-25 DIAGNOSIS — R001 Bradycardia, unspecified: Secondary | ICD-10-CM | POA: Diagnosis not present

## 2022-07-25 DIAGNOSIS — K922 Gastrointestinal hemorrhage, unspecified: Secondary | ICD-10-CM | POA: Diagnosis present

## 2022-07-25 DIAGNOSIS — Z87891 Personal history of nicotine dependence: Secondary | ICD-10-CM | POA: Diagnosis not present

## 2022-07-25 DIAGNOSIS — Z90710 Acquired absence of both cervix and uterus: Secondary | ICD-10-CM | POA: Diagnosis not present

## 2022-07-25 DIAGNOSIS — Z885 Allergy status to narcotic agent status: Secondary | ICD-10-CM | POA: Diagnosis not present

## 2022-07-25 DIAGNOSIS — K635 Polyp of colon: Secondary | ICD-10-CM | POA: Diagnosis present

## 2022-07-25 DIAGNOSIS — I10 Essential (primary) hypertension: Secondary | ICD-10-CM | POA: Diagnosis not present

## 2022-07-25 DIAGNOSIS — K5791 Diverticulosis of intestine, part unspecified, without perforation or abscess with bleeding: Secondary | ICD-10-CM | POA: Diagnosis present

## 2022-07-25 DIAGNOSIS — D62 Acute posthemorrhagic anemia: Secondary | ICD-10-CM | POA: Diagnosis present

## 2022-07-25 DIAGNOSIS — E1122 Type 2 diabetes mellitus with diabetic chronic kidney disease: Secondary | ICD-10-CM | POA: Diagnosis present

## 2022-07-25 DIAGNOSIS — D509 Iron deficiency anemia, unspecified: Secondary | ICD-10-CM | POA: Diagnosis present

## 2022-07-25 DIAGNOSIS — R55 Syncope and collapse: Secondary | ICD-10-CM | POA: Diagnosis not present

## 2022-07-25 DIAGNOSIS — K649 Unspecified hemorrhoids: Secondary | ICD-10-CM | POA: Diagnosis present

## 2022-07-25 DIAGNOSIS — I672 Cerebral atherosclerosis: Secondary | ICD-10-CM | POA: Diagnosis present

## 2022-07-25 DIAGNOSIS — I129 Hypertensive chronic kidney disease with stage 1 through stage 4 chronic kidney disease, or unspecified chronic kidney disease: Secondary | ICD-10-CM | POA: Diagnosis present

## 2022-07-25 DIAGNOSIS — G47411 Narcolepsy with cataplexy: Secondary | ICD-10-CM | POA: Diagnosis present

## 2022-07-25 DIAGNOSIS — I959 Hypotension, unspecified: Secondary | ICD-10-CM | POA: Diagnosis not present

## 2022-07-25 DIAGNOSIS — Z86718 Personal history of other venous thrombosis and embolism: Secondary | ICD-10-CM | POA: Diagnosis not present

## 2022-07-25 DIAGNOSIS — K5731 Diverticulosis of large intestine without perforation or abscess with bleeding: Secondary | ICD-10-CM | POA: Diagnosis not present

## 2022-07-25 DIAGNOSIS — M1990 Unspecified osteoarthritis, unspecified site: Secondary | ICD-10-CM | POA: Diagnosis present

## 2022-07-25 DIAGNOSIS — Z79899 Other long term (current) drug therapy: Secondary | ICD-10-CM | POA: Diagnosis not present

## 2022-07-25 DIAGNOSIS — K219 Gastro-esophageal reflux disease without esophagitis: Secondary | ICD-10-CM | POA: Diagnosis present

## 2022-07-25 DIAGNOSIS — N1832 Chronic kidney disease, stage 3b: Secondary | ICD-10-CM | POA: Diagnosis present

## 2022-07-25 LAB — HEMOGLOBIN AND HEMATOCRIT, BLOOD
HCT: 25.7 % — ABNORMAL LOW (ref 36.0–46.0)
HCT: 30.9 % — ABNORMAL LOW (ref 36.0–46.0)
HCT: 31.3 % — ABNORMAL LOW (ref 36.0–46.0)
Hemoglobin: 10 g/dL — ABNORMAL LOW (ref 12.0–15.0)
Hemoglobin: 10.5 g/dL — ABNORMAL LOW (ref 12.0–15.0)
Hemoglobin: 8.5 g/dL — ABNORMAL LOW (ref 12.0–15.0)

## 2022-07-25 LAB — BASIC METABOLIC PANEL
Anion gap: 4 — ABNORMAL LOW (ref 5–15)
BUN: 11 mg/dL (ref 8–23)
CO2: 18 mmol/L — ABNORMAL LOW (ref 22–32)
Calcium: 8.5 mg/dL — ABNORMAL LOW (ref 8.9–10.3)
Chloride: 119 mmol/L — ABNORMAL HIGH (ref 98–111)
Creatinine, Ser: 1.24 mg/dL — ABNORMAL HIGH (ref 0.44–1.00)
GFR, Estimated: 43 mL/min — ABNORMAL LOW (ref >60)
Glucose, Bld: 120 mg/dL — ABNORMAL HIGH (ref 70–99)
Potassium: 3.8 mmol/L (ref 3.5–5.1)
Sodium: 141 mmol/L (ref 135–145)

## 2022-07-25 LAB — GLUCOSE, CAPILLARY
Glucose-Capillary: 130 mg/dL — ABNORMAL HIGH (ref 70–99)
Glucose-Capillary: 121 mg/dL — ABNORMAL HIGH (ref 70–99)
Glucose-Capillary: 88 mg/dL (ref 70–99)
Glucose-Capillary: 101 mg/dL — ABNORMAL HIGH (ref 70–99)
Glucose-Capillary: 87 mg/dL (ref 70–99)

## 2022-07-25 LAB — MAGNESIUM: Magnesium: 1.5 mg/dL — ABNORMAL LOW (ref 1.7–2.4)

## 2022-07-25 MED ORDER — MAGNESIUM SULFATE 4 GM/100ML IV SOLN
4.0000 g | Freq: Once | INTRAVENOUS | Status: AC
  Administered 2022-07-25: 4 g via INTRAVENOUS
  Filled 2022-07-25: qty 100

## 2022-07-25 MED ORDER — IOHEXOL 350 MG/ML SOLN
75.0000 mL | Freq: Once | INTRAVENOUS | Status: AC | PRN
  Administered 2022-07-25: 75 mL via INTRAVENOUS

## 2022-07-25 MED ORDER — PANTOPRAZOLE INFUSION (NEW) - SIMPLE MED
8.0000 mg/h | INTRAVENOUS | Status: DC
  Administered 2022-07-25 – 2022-07-27 (×6): 8 mg/h via INTRAVENOUS
  Filled 2022-07-25 (×4): qty 80
  Filled 2022-07-25 (×2): qty 100
  Filled 2022-07-25: qty 80

## 2022-07-25 MED ORDER — SODIUM CHLORIDE (PF) 0.9 % IJ SOLN
INTRAMUSCULAR | Status: AC
  Filled 2022-07-25: qty 50

## 2022-07-25 MED ORDER — PANTOPRAZOLE SODIUM 40 MG IV SOLR
40.0000 mg | Freq: Once | INTRAVENOUS | Status: AC
  Administered 2022-07-25: 40 mg via INTRAVENOUS
  Filled 2022-07-25: qty 10

## 2022-07-25 NOTE — Progress Notes (Signed)
    OVERNIGHT PROGRESS REPORT  Notified by RN for , Blood in stool and syncope with a brady rhythm lasting approximately 30 +/- seconds. On arrival to bedside from other side of ICU , patient was awake and oriented with RR 14, BP 150s SBP, HR 60s NSR and O2 at 100%  Patient was able to alert family member and has alerted staff of it's onset. H&H and BMP/Mag STAT.  Nursing also alerted GI on call, recommendation was for Protonix drip and they will see patient on first rounds this AM (07/25/22)  At this time  Labs are pending with the exception of H&H which is     Hgb 10 /HCT 30.9  Patient is awake and oriented and in no obvious or stated distress   Gershon Cull MSNA MSN [place]

## 2022-07-25 NOTE — Progress Notes (Signed)
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 She attributed the dark stool to being back on iron supplementation recently.  Had multiple similar episodes in the past. In TG:GYIRSWNI and saturating well on RA.BUN is 30 and creatinine 1.85.  Hemoglobin is 10.2,given a liter of normal saline, 80 mg IV Protonix>Protonix infusion, [facility] consulted and admitted. 9/10:Seen by GI placed on liquid diet but patient had large bowel movement, + has also dropped 10.2 to 8.5 g twice daily scan was ordered> changed to CT angio:VASCULAR No CT findings for active gastrointestinal hemorrhage.  Overnight patient had syncopal episode with bowel movement transferred to stepdown  9/11-12: 3 syncopal episodes with 3 bowel movement.  9/11 EXTR 2 units transfused     Subjective:  Overnight 9/11-12: 3 syncopal episodes with 3 bowel movement-also hypotensive and bradycardic during the episode. 9/11 EXTR 2 units transfused- HB has improved.  BP stable this morning. Patient resting on bedpan, has no new complaints mild abdominal discomfort.  Assessment and Plan: Principal Problem:   GI bleeding Active Problems:   Hypertension   OSA on CPAP   ABLA (acute blood loss anemia)   Stage 3b chronic kidney disease (CKD) (HCC)   Acute GI bleeding-Hematochezia/melanotic stool: Acute on chronic normocytic anemia Acute blood loss anemia: Baseline hemoglobin 11 to 12 g in July.continues to have frequent bowel movement with each episode having [place] with bradycardia hypotension and syncope.H&H this morning 10 g so far had 3 units PRBC.  She already had CT  bleeding scan 9/10 neg, NM bleeding scan 9/11 neg.  placed back on Protonix drip overnight, GI has been notified await further inputs.  Continue IV fluid hydration, avoid antiplatelets or anticoagulant keep n.p.o.  Monitor serial H&H. I again sent message to Dr. Watt Climes this morning as husband is interested to see him as soon as possible. GI has ordered CT angio and it resulted-I reviewed and informed GI, I have also consulted PCCM Recent Labs  Lab 07/24/22 0340 07/24/22 0627 07/24/22 1457 07/25/22 0007 07/25/22 0443  HGB 7.8* 8.0* 6.5* 10.5* 10.0*  HCT 24.8* 24.6* 20.7* 31.3* 30.9*   Syncopal episodes:during reclat bleeding: In the setting of #1. BP stable. Cont to monitor in tele.  HTN HLD: Hold antihypertensives-losartan and pravastatin  Hypomagnesemia: will repelte   CKD stage IIIb baseline creatinine around 1.4-1.6.stable.  Continue to monitor.  Recent Labs  Lab 07/23/22 0035 07/23/22 1424 07/24/22 0340 07/25/22 0443  BUN 30* '21 16 11  '$ CREATININE 1.85* 1.43* 1.44* 1.24*   OSA:- CPAP qhs Memory issues: hold Aricept.  DVT prophylaxis: SCDs Start: 07/23/22 0236 Code Status:   Code Status: Full Code Family Communication: plan of care discussed with patient/husband at bedside.  Patient status is: inpatient  because of active GI bleeding Level of care: Stepdown  Dispo: The patient is from: home with husband            Anticipated disposition: TBD  Mobility Assessment (last 72 hours)     Mobility Assessment     Row Name 07/23/22 2000 07/23/22 0720 07/23/22 0250       Does patient have an order for bedrest or is patient medically unstable No - Continue assessment Yes- Bedfast (Level 1) -  Complete No - Continue assessment     What is the highest level of mobility based on the progressive mobility assessment? Level 4 (Walks with assist in room) - Balance while marching in place and cannot step forward and back - Complete Level 4 (Walks with assist in room) - Balance while marching in place and cannot step forward and back -  Complete Level 4 (Walks with assist in room) - Balance while marching in place and cannot step forward and back - Complete               Objective: Vitals last 24 hrs: Vitals:   07/25/22 0600 07/25/22 0630 07/25/22 0700 07/25/22 0738  BP: (!) 142/49 132/65 137/60   Pulse: 65 71 86   Resp: '14 12 13   '$ Temp:    98.4 F (36.9 C)  TempSrc:    Oral  SpO2: 100% 100% 100%   Weight:      Height:       Weight change: 1.9 kg  Physical Examination: General exam: AAox3, older than stated age, weak appearing. HEENT:Oral mucosa moist, Ear/Nose WNL grossly, dentition normal. Respiratory system: bilaterally clear, no use of accessory muscle Cardiovascular system: S1 & S2 +, No JVD,. Gastrointestinal system: Abdomen soft, mild generalized tenderness present,ND,BS+ Nervous System:Alert, awake, moving extremities and grossly nonfocal Extremities: LE ankle edema neg, distal peripheral pulses palpable.  Skin: No rashes,no icterus. MSK: Normal muscle bulk,tone, power   Medications reviewed:  Scheduled Meds:  Chlorhexidine Gluconate Cloth  6 each Topical Daily   mouth rinse  15 mL Mouth Rinse 4 times per day   polyvinyl alcohol  1 drop Both Eyes Daily   psyllium  1 packet Oral BID   sodium chloride flush  3 mL Intravenous Q12H   Continuous Infusions:  sodium chloride 30 mL/hr at 07/25/22 0650   magnesium sulfate bolus IVPB     pantoprazole 8 mg/hr (07/25/22 0650)      Diet Order             Diet NPO time specified  Diet effective now                  Unresulted Labs (From admission, onward)     Start     Ordered   07/24/22 0000  Hemoglobin and hematocrit, blood  Now then every 6 hours,   R      07/23/22 2034          Data Reviewed: I have personally reviewed following labs and imaging studies CBC: Recent Labs  Lab 07/23/22 0035 07/23/22 1424 07/24/22 0340 07/24/22 0627 07/24/22 1457 07/25/22 0007 07/25/22 0443  WBC 7.0  --   --   --   --   --   --   HGB  10.2*   < > 7.8* 8.0* 6.5* 10.5* 10.0*  HCT 32.0*   < > 24.8* 24.6* 20.7* 31.3* 30.9*  MCV 95.5  --   --   --   --   --   --   PLT 257  --   --   --   --   --   --    < > = values in this interval not displayed.   Basic Metabolic Panel: Recent Labs  Lab 07/23/22 0035 07/23/22 1424 07/24/22 0340 07/25/22 0443  NA 143 143 140 141  K 4.8 4.6 4.1 3.8  CL 115* 118* 119* 119*  CO2 22 18* 16* 18*  GLUCOSE 137* 127* 203* 120*  BUN 30* '21 16 11  '$ CREATININE 1.85* 1.43* 1.44* 1.24*  CALCIUM 9.6 8.9 8.3* 8.5*  MG  --   --   --  1.5*   GFR: Estimated Creatinine Clearance: 26.4 mL/min (A) (by C-G formula based on SCr of 1.24 mg/dL (H)). Liver Function Tests: Recent Labs  Lab 07/23/22 0035  AST 17  ALT 10  ALKPHOS 63  BILITOT 0.4  PROT 7.7  ALBUMIN 4.3   No results for input(s): "LIPASE", "AMYLASE" in the last 168 hours. No results for input(s): "AMMONIA" in the last 168 hours. Coagulation Profile: Recent Labs  Lab 07/23/22 0107  INR 1.3*   BNP (last 3 results) No results for input(s): "PROBNP" in the last 8760 hours. HbA1C: No results for input(s): "HGBA1C" in the last 72 hours. CBG: Recent Labs  Lab 07/24/22 0751 07/24/22 1350 07/24/22 1643 07/24/22 2126 07/25/22 0501  GLUCAP 106* 74 152* 84 130*   Lipid Profile: No results for input(s): "CHOL", "HDL", "LDLCALC", "TRIG", "CHOLHDL", "LDLDIRECT" in the last 72 hours. Thyroid Function Tests: No results for input(s): "TSH", "T4TOTAL", "FREET4", "T3FREE", "THYROIDAB" in the last 72 hours. Sepsis Labs: No results for input(s): "PROCALCITON", "LATICACIDVEN" in the last 168 hours.  Recent Results (from the past 240 hour(s))  MRSA Next Gen by PCR, Nasal     Status: None   Collection Time: 07/24/22  3:49 AM   Specimen: Nasal Mucosa; Nasal Swab  Result Value Ref Range Status   MRSA by PCR Next Gen NOT DETECTED NOT DETECTED Final    Comment: (NOTE) The GeneXpert MRSA Assay (FDA approved for NASAL specimens only), is  one component of a comprehensive MRSA colonization surveillance program. It is not intended to diagnose MRSA infection nor to guide or monitor treatment for MRSA infections. Test performance is not FDA approved in patients less than 7 years old. Performed at [facility], [place] [address] [place], [place] [address]     Antimicrobials: Anti-infectives (From admission, onward)    None      Culture/Microbiology    Component Value Date/Time   SDES URINE, CATHETERIZED 01/30/2017 1319   SPECREQUEST NONE 01/30/2017 1319   CULT (A) 01/30/2017 1319    20,000 COLONIES/mL PROTEUS MIRABILIS 60,000 COLONIES/mL ENTEROCOCCUS FAECALIS    REPTSTATUS 02/03/2017 FINAL 01/30/2017 1319    Radiology Studies: NM GI Blood Loss  Result Date: 07/24/2022 CLINICAL DATA:  Lower GI bleeding, multiple episodes of dark red blood per rectum EXAM: NUCLEAR MEDICINE GASTROINTESTINAL BLEEDING SCAN TECHNIQUE: Sequential abdominal images were obtained following intravenous administration of Tc-62mlabeled red blood cells. RADIOPHARMACEUTICALS:  22.6 mCi Tc-954mertechnetate in-vitro labeled red cells. COMPARISON:  None Available. FINDINGS: Normal, expected blood pool, urinary tract, and soft tissue radiotracer activity. No accumulating or peristalsing radiotracer activity to localize nidus of GI bleeding. IMPRESSION: Negative scintigraphic nuclear GI bleed examination. Electronically Signed   By: AlDelanna Ahmadi.D.   On: 07/24/2022 13:51   CT Angio Abd/Pel w/ and/or w/o  Result Date: 07/23/2022 CLINICAL DATA:  Hematochezia EXAM: CTA ABDOMEN AND PELVIS WITHOUT AND WITH CONTRAST TECHNIQUE: Multidetector CT imaging of the abdomen and pelvis was performed using the standard protocol during bolus administration of intravenous contrast. Multiplanar reconstructed images and MIPs were obtained and reviewed to evaluate the vascular anatomy. RADIATION DOSE REDUCTION: This exam was performed according to the  departmental dose-optimization program which includes automated exposure control, adjustment of the mA and/or kV according to patient size and/or use of iterative reconstruction technique. CONTRAST:  7578mMNIPAQUE IOHEXOL  350 MG/ML SOLN COMPARISON:  CT 06/15/2021 FINDINGS: VASCULAR Aorta: Moderate atherosclerotic plaque.  No aneurysm or dissection. Celiac: Patent.  No aneurysm or dissection. SMA: Patent.  No aneurysm or dissection. Renals: Mild narrowing of the proximal right renal artery secondary to calcified atherosclerotic plaque. The left renal artery is patent. IMA: Patent. Inflow: Mild plaque without significant narrowing. No aneurysm or dissection. Proximal Outflow: Patent.  No aneurysm or dissection. Veins: No obvious venous abnormality within the limitations of this arterial phase study. Review of the MIP images confirms the above findings. NON-VASCULAR Lower chest: No acute abnormality. Hepatobiliary: No focal liver abnormality is seen. No gallstones, gallbladder wall thickening, or biliary dilatation. Pancreas: Unremarkable. No pancreatic ductal dilatation or surrounding inflammatory changes. Spleen: Normal in size without focal abnormality. Adrenals/Urinary Tract: Adrenal glands are unremarkable. Bilateral cortical renal scarring. Nonobstructing stones in both kidneys with the largest in the upper pole of the left kidney measuring 7 mm. Low-attenuation lesions in the kidneys are statistically likely to represent cysts. No follow-up is required. No hydronephrosis. Unremarkable bladder for degree of distention. Stomach/Bowel: Stomach is unremarkable. Normal caliber small bowel and colon. Postoperative changes about the distal sigmoid colon/rectum. Colonic diverticulosis without diverticulitis. Appendix is unremarkable. No evidence of active contrast extravasation into the lumen of the large or small bowel. Lymphatic: No suspicious lymphadenopathy. Reproductive: Unremarkable. Other: No free  intraperitoneal fluid or air. Musculoskeletal: Demineralization.  No acute fracture. IMPRESSION: VASCULAR No CT findings for active gastrointestinal hemorrhage. Aortic Atherosclerosis (ICD10-I70.0). NON-VASCULAR No acute findings in the abdomen or pelvis. Colonic diverticulosis without acute diverticulitis. Electronically Signed   By: Placido Sou M.D.   On: 07/23/2022 19:38     LOS: 0 days   Antonieta Pert, MD [facility]  07/25/2022, 8:01 AM

## 2022-07-25 NOTE — Progress Notes (Signed)
NAME:  Stephanie Barber, MRN:  [id number], DOB:  Jun 08, 1941, LOS: 0 ADMISSION DATE:  07/23/2022, CONSULTATION DATE:  07/25/2022 REFERRING MD:  Antonieta Pert, CHIEF COMPLAINT: GI bleed  History of Present Illness:  81 year old lady who came in with lower GI bleeding Maroon-colored stool on 9/7-passed large volume maroon stool.  By next day passed a few clots, recurrence on the day of admission.  Since she has been in the unit she did have a large volume movement and became lightheaded and according to her may have passed out about 3 times  Currently feels relatively well  She had a polyp resected and cauterized about 2 years ago, colonoscopy in the past revealed pan diverticulitis  Pertinent  Medical History   Past Medical History:  Diagnosis Date   Arthritis    Chronic headache    Complication of anesthesia    allergy to Novocaine    Diabetes mellitus    GERD (gastroesophageal reflux disease)    GI bleed    Heart murmur    High cholesterol    Hyperlipidemia    Hypertension    Intracranial atherosclerosis    per MRA   Migraine    Narcolepsy and cataplexy    OSA on CPAP    states has not used CPAP in over a year   Rectal prolapse    Stage 3b chronic kidney disease (CKD) (HCC)    Trigger finger of all digits of right hand    Significant Hospital Events: Including procedures, antibiotic start and stop dates in addition to other pertinent events   9/10-admitted for GI bleed 9/12-PCCM consult following syncopal episodes overnight  Interim History / Subjective:  She is awake alert interactive Denies any abdominal pain or discomfort Has not had another large bowel motion since the one last evening  Objective   Blood pressure 137/78, pulse 73, temperature 98.4 F (36.9 C), temperature source Oral, resp. rate 10, height '4\' 11"'$  (1.499 m), weight 54.8 kg, SpO2 100 %.        Intake/Output Summary (Last 24 hours) at 07/25/2022 0919 Last data filed at 07/25/2022 0906 Gross per 24  hour  Intake 2209.09 ml  Output 700 ml  Net 1509.09 ml   Filed Weights   07/23/22 0247 07/24/22 0358 07/25/22 0337  Weight: 54.4 kg 52.9 kg 54.8 kg    Examination: General: Elderly, does appear comfortable HENT: Moist oral mucosa Lungs: Clear breath sounds Cardiovascular: S1-S2 appreciated Abdomen: Bowel sounds appreciated Extremities: No clubbing, no edema Neuro: Alert and oriented x3 GU:   Resolved Hospital Problem list     Assessment & Plan:  Lower GI bleed -History of pan diverticulitis -History of polyp that was cauterized -Enough bleeding to make hemodynamically unstable, not on pressors at present -Has been transfused  Nuclear imaging scan ordered Seen by GI already She does appear hemodynamically stable at the present time  Further plans will depend on nuclear medicine scanning, consideration for embolization if CT angio is needed and shows active bleeding  Hypertension -Stable -Hold antihypertensives  Hyperlipidemia  History of obstructive sleep apnea -Got away from using CPAP many years ago and no longer desires to use 1  Chronic kidney disease stage IIIb -Stable present  Best Practice (right click and "Reselect all SmartList Selections" daily)   Diet/type: NPO DVT prophylaxis: SCD GI prophylaxis: PPI Lines: N/A Foley:  N/A Code Status:  full code Last date of multidisciplinary goals of care discussion [pending]  Labs   CBC: Recent Labs  Lab  07/23/22 0035 07/23/22 1424 07/24/22 0340 07/24/22 0627 07/24/22 1457 07/25/22 0007 07/25/22 0443  WBC 7.0  --   --   --   --   --   --   HGB 10.2*   < > 7.8* 8.0* 6.5* 10.5* 10.0*  HCT 32.0*   < > 24.8* 24.6* 20.7* 31.3* 30.9*  MCV 95.5  --   --   --   --   --   --   PLT 257  --   --   --   --   --   --    < > = values in this interval not displayed.    Basic Metabolic Panel: Recent Labs  Lab 07/23/22 0035 07/23/22 1424 07/24/22 0340 07/25/22 0443  NA 143 143 140 141  K 4.8 4.6 4.1  3.8  CL 115* 118* 119* 119*  CO2 22 18* 16* 18*  GLUCOSE 137* 127* 203* 120*  BUN 30* '21 16 11  '$ CREATININE 1.85* 1.43* 1.44* 1.24*  CALCIUM 9.6 8.9 8.3* 8.5*  MG  --   --   --  1.5*   GFR: Estimated Creatinine Clearance: 26.4 mL/min (A) (by C-G formula based on SCr of 1.24 mg/dL (H)). Recent Labs  Lab 07/23/22 0035  WBC 7.0    Liver Function Tests: Recent Labs  Lab 07/23/22 0035  AST 17  ALT 10  ALKPHOS 63  BILITOT 0.4  PROT 7.7  ALBUMIN 4.3   No results for input(s): "LIPASE", "AMYLASE" in the last 168 hours. No results for input(s): "AMMONIA" in the last 168 hours.  ABG    Component Value Date/Time   TCO2 22 03/10/2021 1506     Coagulation Profile: Recent Labs  Lab 07/23/22 0107  INR 1.3*    Cardiac Enzymes: No results for input(s): "CKTOTAL", "CKMB", "CKMBINDEX", "TROPONINI" in the last 168 hours.  HbA1C: Hgb A1c MFr Bld  Date/Time Value Ref Range Status  05/13/2015 03:32 PM 6.2 (H) 4.8 - 5.6 % Final    Comment:    (NOTE)         Pre-diabetes: 5.7 - 6.4         Diabetes: >6.4         Glycemic control for adults with diabetes: <7.0     CBG: Recent Labs  Lab 07/24/22 1350 07/24/22 1643 07/24/22 2126 07/25/22 0501 07/25/22 0848  GLUCAP 74 152* 84 130* 121*    Review of Systems:   Denies any pain or discomfort, no abdominal pain  Past Medical History:  She,  has a past medical history of Arthritis, Chronic headache, Complication of anesthesia, Diabetes mellitus, GERD (gastroesophageal reflux disease), GI bleed, Heart murmur, High cholesterol, Hyperlipidemia, Hypertension, Intracranial atherosclerosis, Migraine, Narcolepsy and cataplexy, OSA on CPAP, Rectal prolapse, Stage 3b chronic kidney disease (CKD) ([place]), and Trigger finger of all digits of right hand.   Surgical History:   Past Surgical History:  Procedure Laterality Date   ABDOMINAL HYSTERECTOMY     Fibroids   BACK SURGERY     L4,L5 discectomy   CARDIAC CATHETERIZATION      CARPAL TUNNEL RELEASE Right 01/18/2017   COLONOSCOPY WITH PROPOFOL N/A 06/17/2021   Procedure: COLONOSCOPY WITH PROPOFOL;  Surgeon: Wilford Corner, MD;  Location: [facility];  Service: Endoscopy;  Laterality: N/A;   ESOPHAGOGASTRODUODENOSCOPY (EGD) WITH PROPOFOL N/A 06/17/2021   Procedure: ESOPHAGOGASTRODUODENOSCOPY (EGD) WITH PROPOFOL;  Surgeon: Wilford Corner, MD;  Location: [facility];  Service: Endoscopy;  Laterality: N/A;   [place]  Right.  growth removed   POLYPECTOMY  06/17/2021   Procedure: POLYPECTOMY;  Surgeon: Wilford Corner, MD;  Location: [facility];  Service: Endoscopy;;   rectal prolapse repair  12/05/2011   ROTATOR CUFF REPAIR     Left   SHOULDER ARTHROSCOPY WITH SUBACROMIAL DECOMPRESSION, ROTATOR CUFF REPAIR AND BICEP TENDON REPAIR Left 05/25/2015   Procedure: SHOULDER ARTHROSCOPY WITH SUBACROMIAL DECOMPRESSION, ROTATOR CUFF REPAIR AND BICEP TENODESIS, DEBRIDEMENT. ;  Surgeon: Meredith Pel, MD;  Location: [facility];  Service: Orthopedics;  Laterality: Left;  LEFT SHOULDER ROTATOR CUFF TEAR REPAIR, ARTHROSCOPY, DEBRIDEMENT, BICEPS TENODESIS, SUBACROMIAL DECOMPRESSION.   TOTAL ABDOMINAL HYSTERECTOMY     TRIGGER FINGER RELEASE Right 04/30/2018   Procedure: RELEASE TRIGGER FINGER/A-1 PULLEY RIGHT MIDDLE  AND SMALLn ;  Surgeon: Daryll Brod, MD;  Location: [facility];  Service: Orthopedics;  Laterality: Right;  FAB, Bier block   TUBAL LIGATION       Social History:   reports that she quit smoking about 41 years ago. Her smoking use included cigarettes. She has never used smokeless tobacco. She reports that she does not drink alcohol and does not use drugs.   Family History:  Her family history includes Cancer in her father; Heart disease in her brother and mother; Kidney cancer in her brother; Throat cancer in her father.   Allergies Allergies  Allergen Reactions   Morphine And Related Anaphylaxis   Lactose Intolerance (Gi)  Diarrhea and Nausea Only    Sherrilyn Rist, MD [place] [facility] Pager: See Shea Evans

## 2022-07-25 NOTE — Progress Notes (Signed)
[facility] Progress Note  Stephanie Barber 81 y.o. November 08, 1941  CC:  hematochezia   Subjective: Patient had 3 bowel movements consisting of dark red blood and clots overnight, reported 3 syncopal episodes in which she became hypotensive and bradycardic.  She was transfused with 2 units of packed red blood cells.  This time vital signs stable.  Patient is responsive.  ROS : Review of Systems  Gastrointestinal:  Positive for blood in stool. Negative for abdominal pain, constipation, heartburn, melena, nausea and vomiting.  Genitourinary:  Negative for dysuria and urgency.     Objective: Vital signs in last 24 hours: Vitals:   07/25/22 0800 07/25/22 0900  BP: (!) 128/53 137/78  Pulse: (!) 45 73  Resp: 14 10  Temp:    SpO2: 100% 100%    Physical Exam:  General:  Alert, cooperative, no distress, appears stated age  Head:  Normocephalic, without obvious abnormality, atraumatic  Eyes:  Anicteric sclera, EOM's intact  Lungs:   Clear to auscultation bilaterally, respirations unlabored  Heart:  Regular rate and rhythm, S1, S2 normal  Abdomen:   Soft, non-tender, bowel sounds active all four quadrants,  no masses,   Extremities: Extremities normal, atraumatic, no  edema  Pulses: 2+ and symmetric    Lab Results: Recent Labs    07/24/22 0340 07/25/22 0443  NA 140 141  K 4.1 3.8  CL 119* 119*  CO2 16* 18*  GLUCOSE 203* 120*  BUN 16 11  CREATININE 1.44* 1.24*  CALCIUM 8.3* 8.5*  MG  --  1.5*   Recent Labs    07/23/22 0035  AST 17  ALT 10  ALKPHOS 63  BILITOT 0.4  PROT 7.7  ALBUMIN 4.3   Recent Labs    07/23/22 0035 07/23/22 1424 07/25/22 0007 07/25/22 0443  WBC 7.0  --   --   --   HGB 10.2*   < > 10.5* 10.0*  HCT 32.0*   < > 31.3* 30.9*  MCV 95.5  --   --   --   PLT 257  --   --   --    < > = values in this interval not displayed.   Recent Labs    07/23/22 0107  LABPROT 15.6*  INR 1.3*      Assessment Possible diverticular bleed    Hemoglobin dropped to 6.5 overnight, she was transfused 2 units packed red blood cells hemoglobin now stable at 10.  We will continue to monitor hemoglobin.  Last episode rectal bleeding noticed around 6 AM.  Vital signs stable when evaluated this morning.   Nuclear medicine GI bleeding scan 07/24/2022 Negative  CTA abdomen and pelvis 07/23/2022 No acute findings in the abdomen or pelvis. No CT findings for active gastrointestinal hemorrhage. Colonic diverticulosis without acute diverticulitis.   Patient with history of pandiverticulosis.  Continues to have dark red blood and clots in bowel movement.  No findings of active GI bleed on CT scan or NM bleeding scan.  Given drop in hemoglobin, syncopal episode and continued rectal bleeding will repeat CT angio for evaluation of GI bleed.  Plan: Follow results of CT Angio for GI bleed Continue to monitor hemoglobin closely, transfuse if less than 7.  Continue pantoprazole drip Continue Metamucil 1 packet daily [facility] will follow  Charlott Rakes PA-C 07/25/2022, 10:51 AM  Contact #  [phone]

## 2022-07-25 NOTE — Progress Notes (Addendum)
Pt has had 3 bloody clot filled Bms starting at 0400.   Bms have been associated with syncope x3, >30 point drop in BP, irregular HR w/ brady down to 20s  Code pads placed on pt chest and code cart outside the door  PCCM, [facility] on call, and [facility] providers aware

## 2022-07-26 DIAGNOSIS — K922 Gastrointestinal hemorrhage, unspecified: Secondary | ICD-10-CM | POA: Diagnosis not present

## 2022-07-26 DIAGNOSIS — G4733 Obstructive sleep apnea (adult) (pediatric): Secondary | ICD-10-CM | POA: Diagnosis not present

## 2022-07-26 DIAGNOSIS — I10 Essential (primary) hypertension: Secondary | ICD-10-CM | POA: Diagnosis not present

## 2022-07-26 DIAGNOSIS — N1832 Chronic kidney disease, stage 3b: Secondary | ICD-10-CM | POA: Diagnosis not present

## 2022-07-26 DIAGNOSIS — D62 Acute posthemorrhagic anemia: Secondary | ICD-10-CM | POA: Diagnosis not present

## 2022-07-26 LAB — CBC
HCT: 22.3 % — ABNORMAL LOW (ref 36.0–46.0)
HCT: 26.9 % — ABNORMAL LOW (ref 36.0–46.0)
Hemoglobin: 7.3 g/dL — ABNORMAL LOW (ref 12.0–15.0)
Hemoglobin: 9.1 g/dL — ABNORMAL LOW (ref 12.0–15.0)
MCH: 30.4 pg (ref 26.0–34.0)
MCH: 30.8 pg (ref 26.0–34.0)
MCHC: 32.7 g/dL (ref 30.0–36.0)
MCHC: 33.8 g/dL (ref 30.0–36.0)
MCV: 92.9 fL (ref 80.0–100.0)
MCV: 91.2 fL (ref 80.0–100.0)
Platelets: 145 10*3/uL — ABNORMAL LOW (ref 150–400)
Platelets: 152 10*3/uL (ref 150–400)
RBC: 2.4 MIL/uL — ABNORMAL LOW (ref 3.87–5.11)
RBC: 2.95 MIL/uL — ABNORMAL LOW (ref 3.87–5.11)
RDW: 14 % (ref 11.5–15.5)
RDW: 13.1 % (ref 11.5–15.5)
WBC: 7.6 10*3/uL (ref 4.0–10.5)
WBC: 8.9 10*3/uL (ref 4.0–10.5)
nRBC: 0 % (ref 0.0–0.2)
nRBC: 0.2 % (ref 0.0–0.2)

## 2022-07-26 LAB — BASIC METABOLIC PANEL
Anion gap: 6 (ref 5–15)
BUN: 9 mg/dL (ref 8–23)
CO2: 17 mmol/L — ABNORMAL LOW (ref 22–32)
Calcium: 8.2 mg/dL — ABNORMAL LOW (ref 8.9–10.3)
Chloride: 117 mmol/L — ABNORMAL HIGH (ref 98–111)
Creatinine, Ser: 1.35 mg/dL — ABNORMAL HIGH (ref 0.44–1.00)
GFR, Estimated: 39 mL/min — ABNORMAL LOW (ref >60)
Glucose, Bld: 91 mg/dL (ref 70–99)
Potassium: 3.8 mmol/L (ref 3.5–5.1)
Sodium: 140 mmol/L (ref 135–145)

## 2022-07-26 LAB — PREPARE RBC (CROSSMATCH)

## 2022-07-26 LAB — GLUCOSE, CAPILLARY
Glucose-Capillary: 132 mg/dL — ABNORMAL HIGH (ref 70–99)
Glucose-Capillary: 60 mg/dL — ABNORMAL LOW (ref 70–99)
Glucose-Capillary: 71 mg/dL (ref 70–99)
Glucose-Capillary: 79 mg/dL (ref 70–99)
Glucose-Capillary: 97 mg/dL (ref 70–99)
Glucose-Capillary: 99 mg/dL (ref 70–99)

## 2022-07-26 LAB — HEMOGLOBIN AND HEMATOCRIT, BLOOD
HCT: 29.4 % — ABNORMAL LOW (ref 36.0–46.0)
Hemoglobin: 9.8 g/dL — ABNORMAL LOW (ref 12.0–15.0)

## 2022-07-26 MED ORDER — MELATONIN 3 MG PO TABS
3.0000 mg | ORAL_TABLET | Freq: Once | ORAL | Status: AC
  Administered 2022-07-26: 3 mg via ORAL
  Filled 2022-07-26: qty 1

## 2022-07-26 MED ORDER — MELATONIN 3 MG PO TABS
3.0000 mg | ORAL_TABLET | Freq: Once | ORAL | Status: DC
  Administered 2022-07-26: 3 mg via ORAL
  Filled 2022-07-26: qty 1

## 2022-07-26 MED ORDER — SODIUM CHLORIDE 0.9% IV SOLUTION: Freq: Once | INTRAVENOUS | Status: AC

## 2022-07-26 NOTE — Progress Notes (Signed)
NAME:  Stephanie Barber, MRN:  [id number], DOB:  04-Feb-1941, LOS: 1 ADMISSION DATE:  07/23/2022, CONSULTATION DATE:  07/25/2022 REFERRING MD:  Antonieta Pert, CHIEF COMPLAINT: GI bleed  History of Present Illness:  81 year old lady who came in with lower GI bleeding Maroon-colored stool on 9/7-passed large volume maroon stool.  By next day passed a few clots, recurrence on the day of admission.  Since she has been in the unit she did have a large volume movement and became lightheaded and according to her may have passed out about 3 times  Currently feels relatively well  She had a polyp resected and cauterized about 2 years ago, colonoscopy in the past revealed pan diverticulitis  Pertinent  Medical History   Past Medical History:  Diagnosis Date   Arthritis    Chronic headache    Complication of anesthesia    allergy to Novocaine    Diabetes mellitus    GERD (gastroesophageal reflux disease)    GI bleed    Heart murmur    High cholesterol    Hyperlipidemia    Hypertension    Intracranial atherosclerosis    per MRA   Migraine    Narcolepsy and cataplexy    OSA on CPAP    states has not used CPAP in over a year   Rectal prolapse    Stage 3b chronic kidney disease (CKD) (HCC)    Trigger finger of all digits of right hand    Significant Hospital Events: Including procedures, antibiotic start and stop dates in addition to other pertinent events   9/10-admitted for GI bleed 9/12-PCCM consult following syncopal episodes overnight  Interim History / Subjective:  She is awake alert interactive Less lightheaded Has not had any significant abdominal pain or discomfort No significant bowel motions as well No syncopal episodes Not on pressors  Objective   Blood pressure (!) 152/45, pulse 78, temperature 97.8 F (36.6 C), temperature source Oral, resp. rate 10, height '4\' 11"'$  (1.499 m), weight 53.1 kg, SpO2 97 %.        Intake/Output Summary (Last 24 hours) at 07/26/2022  0852 Last data filed at 07/26/2022 0600 Gross per 24 hour  Intake 1794.81 ml  Output 850 ml  Net 944.81 ml   Filed Weights   07/24/22 0358 07/25/22 0337 07/26/22 0336  Weight: 52.9 kg 54.8 kg 53.1 kg    Examination: General: Elderly, appears comfortable HENT: Moist oral mucosa Lungs: Clear breath sounds Cardiovascular: S1-S2 appreciated Abdomen: Bowel sounds appreciated, soft, nontender Extremities: No clubbing, no edema Neuro: Alert and oriented x3 GU:   Resolved Hospital Problem list     Assessment & Plan:   Lower GI bleed -History of pan diverticulitis -History of polyps that was cauterized about 2 years ago -Post transfusion and appears to be stable  Nuclear imaging study negative  Hypertension -Appears stable -Home antihypertensives on hold  History of obstructive sleep apnea -Has not used CPAP in years and no longer desires to use it  Chronic kidney disease stage IIIb -Stable at present  Will sign off at present Call as needed  Best Practice (right click and "Reselect all SmartList Selections" daily)   Diet/type: NPO DVT prophylaxis: SCD GI prophylaxis: PPI Lines: N/A Foley:  N/A Code Status:  full code Last date of multidisciplinary goals of care discussion [pending]  Labs   CBC: Recent Labs  Lab 07/23/22 0035 07/23/22 1424 07/24/22 1457 07/25/22 0007 07/25/22 0443 07/25/22 1028 07/26/22 0249  WBC 7.0  --   --   --   --   --  7.6  HGB 10.2*   < > 6.5* 10.5* 10.0* 8.5* 7.3*  HCT 32.0*   < > 20.7* 31.3* 30.9* 25.7* 22.3*  MCV 95.5  --   --   --   --   --  92.9  PLT 257  --   --   --   --   --  145*   < > = values in this interval not displayed.    Basic Metabolic Panel: Recent Labs  Lab 07/23/22 0035 07/23/22 1424 07/24/22 0340 07/25/22 0443 07/26/22 0249  NA 143 143 140 141 140  K 4.8 4.6 4.1 3.8 3.8  CL 115* 118* 119* 119* 117*  CO2 22 18* 16* 18* 17*  GLUCOSE 137* 127* 203* 120* 91  BUN 30* '21 16 11 9  '$ CREATININE 1.85*  1.43* 1.44* 1.24* 1.35*  CALCIUM 9.6 8.9 8.3* 8.5* 8.2*  MG  --   --   --  1.5*  --    GFR: Estimated Creatinine Clearance: 23.9 mL/min (A) (by C-G formula based on SCr of 1.35 mg/dL (H)). Recent Labs  Lab 07/23/22 0035 07/26/22 0249  WBC 7.0 7.6    Liver Function Tests: Recent Labs  Lab 07/23/22 0035  AST 17  ALT 10  ALKPHOS 63  BILITOT 0.4  PROT 7.7  ALBUMIN 4.3   No results for input(s): "LIPASE", "AMYLASE" in the last 168 hours. No results for input(s): "AMMONIA" in the last 168 hours.  ABG    Component Value Date/Time   TCO2 22 03/10/2021 1506     Coagulation Profile: Recent Labs  Lab 07/23/22 0107  INR 1.3*    Cardiac Enzymes: No results for input(s): "CKTOTAL", "CKMB", "CKMBINDEX", "TROPONINI" in the last 168 hours.  HbA1C: Hgb A1c MFr Bld  Date/Time Value Ref Range Status  05/13/2015 03:32 PM 6.2 (H) 4.8 - 5.6 % Final    Comment:    (NOTE)         Pre-diabetes: 5.7 - 6.4         Diabetes: >6.4         Glycemic control for adults with diabetes: <7.0     CBG: Recent Labs  Lab 07/25/22 0848 07/25/22 1244 07/25/22 1700 07/25/22 2122 07/26/22 0729  GLUCAP 121* 88 101* 87 79    Review of Systems:   Denies any pain or discomfort, no abdominal pain  Past Medical History:  She,  has a past medical history of Arthritis, Chronic headache, Complication of anesthesia, Diabetes mellitus, GERD (gastroesophageal reflux disease), GI bleed, Heart murmur, High cholesterol, Hyperlipidemia, Hypertension, Intracranial atherosclerosis, Migraine, Narcolepsy and cataplexy, OSA on CPAP, Rectal prolapse, Stage 3b chronic kidney disease (CKD) ([place]), and Trigger finger of all digits of right hand.   Surgical History:   Past Surgical History:  Procedure Laterality Date   ABDOMINAL HYSTERECTOMY     Fibroids   BACK SURGERY     L4,L5 discectomy   CARDIAC CATHETERIZATION     CARPAL TUNNEL RELEASE Right 01/18/2017   COLONOSCOPY WITH PROPOFOL N/A 06/17/2021    Procedure: COLONOSCOPY WITH PROPOFOL;  Surgeon: Wilford Corner, MD;  Location: [facility];  Service: Endoscopy;  Laterality: N/A;   ESOPHAGOGASTRODUODENOSCOPY (EGD) WITH PROPOFOL N/A 06/17/2021   Procedure: ESOPHAGOGASTRODUODENOSCOPY (EGD) WITH PROPOFOL;  Surgeon: Wilford Corner, MD;  Location: [facility];  Service: Endoscopy;  Laterality: N/A;   KIDNEY SURGERY  1998   Right.  growth removed   POLYPECTOMY  06/17/2021   Procedure: POLYPECTOMY;  Surgeon: Wilford Corner, MD;  Location: [facility];  Service: Endoscopy;;   rectal prolapse repair  12/05/2011   ROTATOR CUFF REPAIR     Left   SHOULDER ARTHROSCOPY WITH SUBACROMIAL DECOMPRESSION, ROTATOR CUFF REPAIR AND BICEP TENDON REPAIR Left 05/25/2015   Procedure: SHOULDER ARTHROSCOPY WITH SUBACROMIAL DECOMPRESSION, ROTATOR CUFF REPAIR AND BICEP TENODESIS, DEBRIDEMENT. ;  Surgeon: Meredith Pel, MD;  Location: [place] [facility];  Service: Orthopedics;  Laterality: Left;  LEFT SHOULDER ROTATOR CUFF TEAR REPAIR, ARTHROSCOPY, DEBRIDEMENT, BICEPS TENODESIS, SUBACROMIAL DECOMPRESSION.   TOTAL ABDOMINAL HYSTERECTOMY     TRIGGER FINGER RELEASE Right 04/30/2018   Procedure: RELEASE TRIGGER FINGER/A-1 PULLEY RIGHT MIDDLE  AND SMALLn ;  Surgeon: Daryll Brod, MD;  Location: [facility];  Service: Orthopedics;  Laterality: Right;  FAB, Bier block   TUBAL LIGATION       Social History:   reports that she quit smoking about 41 years ago. Her smoking use included cigarettes. She has never used smokeless tobacco. She reports that she does not drink alcohol and does not use drugs.   Family History:  Her family history includes Cancer in her father; Heart disease in her brother and mother; Kidney cancer in her brother; Throat cancer in her father.   Allergies Allergies  Allergen Reactions   Morphine And Related Anaphylaxis   Lactose Intolerance (Gi) Diarrhea and Nausea Only    Sherrilyn Rist, MD [facility] Pager: See Shea Evans

## 2022-07-26 NOTE — Progress Notes (Signed)
[facility] Gastroenterology Progress Note  Stephanie Barber 81 y.o. 1941/03/18  Subjective: Patient seen and examined laying in bed comfortably.  No bowel movement in the last 24 hours.  No further episodes of bleeding in the last 24 hours.  Hemoglobin 7.2 this morning, planned for 1 unit packed red blood cell transfer.   ROS : Review of Systems  Gastrointestinal:  Negative for abdominal pain, blood in stool, constipation, diarrhea, heartburn, melena, nausea and vomiting.  Genitourinary:  Negative for dysuria and urgency.      Objective: Vital signs in last 24 hours: Vitals:   07/26/22 0926 07/26/22 0941  BP:  (!) 144/49  Pulse: 80 76  Resp: 16 17  Temp:  98.3 F (36.8 C)  SpO2: 99% 98%    Physical Exam:  General:  Alert, cooperative, no distress, appears stated age, pallor  Head:  Normocephalic, without obvious abnormality, atraumatic  Eyes:  Anicteric sclera, EOM's intact, conjunctival pallor  Lungs:   Clear to auscultation bilaterally, respirations unlabored  Heart:  Regular rate and rhythm, S1, S2 normal  Abdomen:   Soft, non-tender, bowel sounds active all four quadrants,  no masses,   Extremities: Extremities normal, atraumatic, no  edema  Pulses: 2+ and symmetric    Lab Results: Recent Labs    07/25/22 0443 07/26/22 0249  NA 141 140  K 3.8 3.8  CL 119* 117*  CO2 18* 17*  GLUCOSE 120* 91  BUN 11 9  CREATININE 1.24* 1.35*  CALCIUM 8.5* 8.2*  MG 1.5*  --    No results for input(s): "AST", "ALT", "ALKPHOS", "BILITOT", "PROT", "ALBUMIN" in the last 72 hours. Recent Labs    07/25/22 1028 07/26/22 0249  WBC  --  7.6  HGB 8.5* 7.3*  HCT 25.7* 22.3*  MCV  --  92.9  PLT  --  145*   No results for input(s): "LABPROT", "INR" in the last 72 hours.    Assessment Diverticular bleed   Hemoglobin dropped to 6.5 overnight, she is starting 4th unit packed red blood cells transfer this morning.  We will continue to monitor hemoglobin.  Hemoglobin this morning 7.3,  no episodes of hematochezia in the last 24 hours.  Vital signs stable when evaluated this morning.   Nuclear medicine GI bleeding scan 07/24/2022 Negative   CTA abdomen and pelvis 07/23/2022 No acute findings in the abdomen or pelvis. No CT findings for active gastrointestinal hemorrhage. Colonic diverticulosis without acute diverticulitis.  CTA abdomen and pelvis 07/25/2022 No CT findings for active gastrointestinal hemorrhage.   Patient with history of pandiverticulosis.  No findings of active GI bleed on CT scan x2 or NM bleeding scan.     Plan: Need to monitor hemoglobin, transfuse if less than 7 Continue metamucil daily Start full liquid diet, advance as tolerated [facility] will follow  Charlott Rakes PA-C 07/26/2022, 10:51 AM  Contact #  [phone]

## 2022-07-26 NOTE — Progress Notes (Signed)
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 She attributed the dark stool to being back on iron supplementation recently.  Had multiple similar episodes in the past.  In OE:[id number] and saturating well on RA.BUN is 30 and creatinine 1.85.  Hemoglobin is 10.2,given a liter of normal saline, 80 mg IV Protonix>Protonix infusion, [facility] consulted and admitted.  9/10:Seen by GI placed on liquid diet but patient had large bowel movement, + has also dropped 10.2 to 8.5 g. CT bleeding scan no active gastrointestinal hemorrhage.  Overnight patient had syncopal episode with bowel movement transferred to stepdown  9/11-12: 3 syncopal episodes with 3 bowel movement.  2 units PRBC  9/13: clinically improving      Subjective:  Patient seen and examined.  No overnight events.  Denies any complaints.  Last 24 hours has been uneventful but no bowel movements also.  She declined to take Metamucil.  Taking some clears. Hemoglobin 7.3.  Assessment and Plan: Principal Problem:   GI bleeding Active Problems:   Hypertension   OSA on CPAP   Acute GI bleeding   ABLA (acute blood loss anemia)   Stage 3b chronic kidney disease (CKD) (HCC)   Acute GI bleeding-Hematochezia/melanotic stool: Acute on chronic normocytic anemia Acute blood loss anemia: Reported baseline hemoglobin 11-12. Hemoglobin dropped to 6.5-received total 3 units of PRBC with improvement to 10-now back to 7.3. 1 additional PRBC today.  Continue maintenance IV fluid. Likely diverticular bleed.  GI following.  Advance diet to full liquid diet and challenge today. May need follow-up colonoscopy in future.   Syncopal  episodes:during reclat bleeding: In the setting of #1. BP stable. Cont to monitor in tele.  HTN HLD: Hold antihypertensives-losartan and pravastatin  Hypomagnesemia: replaced.  CKD stage IIIb baseline creatinine around 1.4-1.6.stable.  Continue to monitor.  Recent Labs  Lab 07/23/22 0035 07/23/22 1424 07/24/22 0340 07/25/22 0443 07/26/22 0249  BUN 30* '21 16 11 9  '$ CREATININE 1.85* 1.43* 1.44* 1.24* 1.35*    OSA:- CPAP qhs  Memory issues: hold Aricept.  DVT prophylaxis: SCDs Start: 07/23/22 0236 Code Status:   Code Status: Full Code Family Communication: plan of care discussed with patient/husband at bedside.  Patient status is: inpatient  because of active GI bleeding Level of care: Stepdown  Dispo: The patient is from: home with husband            Anticipated disposition: TBD, start mobilizing.  Mobility Assessment (last 72 hours)     Mobility Assessment     Row Name 07/23/22 2000           Does patient have an order for bedrest or is patient medically unstable No - Continue assessment       What is the highest level of mobility based on the progressive mobility assessment? Level 4 (Walks with assist in room) - Balance while marching in place and cannot step forward and back - Complete                 Objective: Vitals last 24 hrs: Vitals:   07/26/22 0336 07/26/22 0400 07/26/22 0500 07/26/22 0720  BP:  (!) 151/65 (!) 152/45   Pulse: 72 74 78   Resp: 13 12  10   Temp: 98.2 F (36.8 C)   97.8 F (36.6 C)  TempSrc: Oral   Oral  SpO2: 100% 100% 97%   Weight: 53.1 kg     Height:       Weight change: -1.7 kg  Physical Examination:  General: Looks comfortable.  Talkative. Cardiovascular: 1 S2 normal. Respiratory: No added sounds. Gastrointestinal: Soft.  Nontender.  Bowel sounds present. Ext: No swelling or cyanosis.  No edema. Neuro: Intact. Musculoskeletal: No deformities.   Medications reviewed:  Scheduled Meds:  sodium chloride    Intravenous Once   Chlorhexidine Gluconate Cloth  6 each Topical Daily   mouth rinse  15 mL Mouth Rinse 4 times per day   polyvinyl alcohol  1 drop Both Eyes Daily   psyllium  1 packet Oral BID   sodium chloride flush  3 mL Intravenous Q12H   Continuous Infusions:  sodium chloride 75 mL/hr at 07/26/22 0600   pantoprazole 8 mg/hr (07/26/22 0600)      Diet Order             Diet full liquid Room service appropriate? Yes; Fluid consistency: Thin  Diet effective now                  Unresulted Labs (From admission, onward)     Start     Ordered   07/26/22 0732  Prepare RBC (crossmatch)  (Adult Blood Administration - Red Blood Cells)  Once,   R       Question Answer Comment  # of Units 1 unit   Transfusion Indications Actively Bleeding / GI Bleed   Number of Units to Keep Ahead NO units ahead   Instructions: Transfuse   If emergent release call blood bank Not emergent release      07/26/22 0731   07/26/22 8676  Basic metabolic panel  Daily at 5am,   R     Question:  Specimen collection method  Answer:  Lab=Lab collect   07/25/22 0845   07/26/22 0500  CBC  Daily at 5am,   R     Question:  Specimen collection method  Answer:  Lab=Lab collect   07/25/22 0845          Data Reviewed: I have personally reviewed following labs and imaging studies CBC: Recent Labs  Lab 07/23/22 0035 07/23/22 1424 07/24/22 1457 07/25/22 0007 07/25/22 0443 07/25/22 1028 07/26/22 0249  WBC 7.0  --   --   --   --   --  7.6  HGB 10.2*   < > 6.5* 10.5* 10.0* 8.5* 7.3*  HCT 32.0*   < > 20.7* 31.3* 30.9* 25.7* 22.3*  MCV 95.5  --   --   --   --   --  92.9  PLT 257  --   --   --   --   --  145*   < > = values in this interval not displayed.    Basic Metabolic Panel: Recent Labs  Lab 07/23/22 0035 07/23/22 1424 07/24/22 0340 07/25/22 0443 07/26/22 0249  NA 143 143 140 141 140  K 4.8 4.6 4.1 3.8 3.8  CL 115* 118* 119* 119* 117*  CO2 22 18* 16* 18* 17*  GLUCOSE 137* 127* 203* 120*  91  BUN 30* '21 16 11 9  '$ CREATININE 1.85* 1.43* 1.44* 1.24* 1.35*  CALCIUM 9.6 8.9 8.3* 8.5* 8.2*  MG  --   --   --  1.5*  --  GFR: Estimated Creatinine Clearance: 23.9 mL/min (A) (by C-G formula based on SCr of 1.35 mg/dL (H)). Liver Function Tests: Recent Labs  Lab 07/23/22 0035  AST 17  ALT 10  ALKPHOS 63  BILITOT 0.4  PROT 7.7  ALBUMIN 4.3    No results for input(s): "LIPASE", "AMYLASE" in the last 168 hours. No results for input(s): "AMMONIA" in the last 168 hours. Coagulation Profile: Recent Labs  Lab 07/23/22 0107  INR 1.3*    BNP (last 3 results) No results for input(s): "PROBNP" in the last 8760 hours. HbA1C: No results for input(s): "HGBA1C" in the last 72 hours. CBG: Recent Labs  Lab 07/25/22 0848 07/25/22 1244 07/25/22 1700 07/25/22 2122 07/26/22 0729  GLUCAP 121* 88 101* 87 79    Lipid Profile: No results for input(s): "CHOL", "HDL", "LDLCALC", "TRIG", "CHOLHDL", "LDLDIRECT" in the last 72 hours. Thyroid Function Tests: No results for input(s): "TSH", "T4TOTAL", "FREET4", "T3FREE", "THYROIDAB" in the last 72 hours. Sepsis Labs: No results for input(s): "PROCALCITON", "LATICACIDVEN" in the last 168 hours.  Recent Results (from the past 240 hour(s))  MRSA Next Gen by PCR, Nasal     Status: None   Collection Time: 07/24/22  3:49 AM   Specimen: Nasal Mucosa; Nasal Swab  Result Value Ref Range Status   MRSA by PCR Next Gen NOT DETECTED NOT DETECTED Final    Comment: (NOTE) The GeneXpert MRSA Assay (FDA approved for NASAL specimens only), is one component of a comprehensive MRSA colonization surveillance program. It is not intended to diagnose MRSA infection nor to guide or monitor treatment for MRSA infections. Test performance is not FDA approved in patients less than 58 years old. Performed at [facility], [place] [address] [place], [place] [address]     Antimicrobials: Anti-infectives (From admission, onward)     None      Culture/Microbiology    Component Value Date/Time   SDES URINE, CATHETERIZED 01/30/2017 1319   SPECREQUEST NONE 01/30/2017 1319   CULT (A) 01/30/2017 1319    20,000 COLONIES/mL PROTEUS MIRABILIS 60,000 COLONIES/mL ENTEROCOCCUS FAECALIS    REPTSTATUS 02/03/2017 FINAL 01/30/2017 1319    Radiology Studies: CT ANGIO GI BLEED  Result Date: 07/25/2022 CLINICAL DATA:  Hematochezia, possible diverticular bleed, status post transfusion EXAM: CTA ABDOMEN AND PELVIS WITHOUT AND WITH CONTRAST TECHNIQUE: Multidetector CT imaging of the abdomen and pelvis was performed using the standard protocol during bolus administration of intravenous contrast. Multiplanar reconstructed images and MIPs were obtained and reviewed to evaluate the vascular anatomy. RADIATION DOSE REDUCTION: This exam was performed according to the departmental dose-optimization program which includes automated exposure control, adjustment of the mA and/or kV according to patient size and/or use of iterative reconstruction technique. CONTRAST:  38m OMNIPAQUE IOHEXOL 350 MG/ML SOLN COMPARISON:  07/23/2022 FINDINGS: VASCULAR Normal contour and caliber of the abdominal aorta. No evidence of aneurysm, dissection, or other acute aortic pathology. Standard branching pattern of the abdominal aorta with solitary bilateral renal arteries. Moderate mixed calcific atherosclerosis. Branch vessel origins are patent. Review of the MIP images confirms the above findings. NON-VASCULAR Lower chest: No acute abnormality. Hepatobiliary: No solid liver abnormality is seen. Excreted contrast in the gallbladder. No gallstones, gallbladder wall thickening, or biliary dilatation. Pancreas: Unremarkable. No pancreatic ductal dilatation or surrounding inflammatory changes. Spleen: Normal in size without significant abnormality. Adrenals/Urinary Tract: Adrenal glands are unremarkable. Small nonobstructive bilateral renal calculi. No ureteral calculi or  hydronephrosis. Bladder is unremarkable. Stomach/Bowel: Stomach is within normal limits. Appendix appears normal. Status  post sigmoid colon resection and reanastomosis. Pancolonic diverticulosis. High attenuation ingested material present in the terminal ileum (series 3, image 60), ascending and transverse colon (series 3, image 42, 33) on precontrast phase, without contrast blush or other evidence of contrast extravasation on arterial and portal venous phases. Lymphatic: No enlarged abdominal or pelvic lymph nodes. Reproductive: No mass or other significant abnormality. Other: No abdominal wall hernia or abnormality. No ascites. Musculoskeletal: No acute or significant osseous findings. IMPRESSION: 1. Pancolonic diverticula. Status post sigmoid colon resection and reanastomosis. High attenuation ingested material scattered in the terminal ileum and colon, without evidence of contrast blush or intraluminal contrast extravasation to localize GI bleed. 2. Normal contour and caliber of the abdominal aorta. No evidence of aneurysm, dissection, or other acute aortic pathology. Moderate mixed calcific atherosclerosis. 3. Nonobstructive bilateral nephrolithiasis. Electronically Signed   By: Delanna Ahmadi M.D.   On: 07/25/2022 12:44   NM GI Blood Loss  Result Date: 07/24/2022 CLINICAL DATA:  Lower GI bleeding, multiple episodes of dark red blood per rectum EXAM: NUCLEAR MEDICINE GASTROINTESTINAL BLEEDING SCAN TECHNIQUE: Sequential abdominal images were obtained following intravenous administration of Tc-39mlabeled red blood cells. RADIOPHARMACEUTICALS:  22.6 mCi [id number] in-vitro labeled red cells. COMPARISON:  None Available. FINDINGS: Normal, expected blood pool, urinary tract, and soft tissue radiotracer activity. No accumulating or peristalsing radiotracer activity to localize nidus of GI bleeding. IMPRESSION: Negative scintigraphic nuclear GI bleed examination. Electronically Signed   By: AlDelanna AhmadiM.D.   On: 07/24/2022 13:51     LOS: 1 day   KuBarb MerinoMD [facility]  07/26/2022, 7:32 AM

## 2022-07-27 ENCOUNTER — Encounter (HOSPITAL_COMMUNITY): Payer: Self-pay

## 2022-07-27 DIAGNOSIS — D62 Acute posthemorrhagic anemia: Secondary | ICD-10-CM | POA: Diagnosis not present

## 2022-07-27 DIAGNOSIS — N1832 Chronic kidney disease, stage 3b: Secondary | ICD-10-CM | POA: Diagnosis not present

## 2022-07-27 DIAGNOSIS — K922 Gastrointestinal hemorrhage, unspecified: Secondary | ICD-10-CM | POA: Diagnosis not present

## 2022-07-27 DIAGNOSIS — G4733 Obstructive sleep apnea (adult) (pediatric): Secondary | ICD-10-CM | POA: Diagnosis not present

## 2022-07-27 LAB — GLUCOSE, CAPILLARY
Glucose-Capillary: 91 mg/dL (ref 70–99)
Glucose-Capillary: 71 mg/dL (ref 70–99)
Glucose-Capillary: 93 mg/dL (ref 70–99)

## 2022-07-27 LAB — HEMOGLOBIN AND HEMATOCRIT, BLOOD
HCT: 27.8 % — ABNORMAL LOW (ref 36.0–46.0)
HCT: 27.2 % — ABNORMAL LOW (ref 36.0–46.0)
Hemoglobin: 9.6 g/dL — ABNORMAL LOW (ref 12.0–15.0)
Hemoglobin: 9.2 g/dL — ABNORMAL LOW (ref 12.0–15.0)

## 2022-07-27 LAB — BPAM RBC
Blood Product Expiration Date: 202310112359
Blood Product Expiration Date: 202310112359
Blood Product Expiration Date: 202310112359
Blood Product Expiration Date: 202310162359
ISSUE DATE / TIME: 202309102224
ISSUE DATE / TIME: 202309111632
ISSUE DATE / TIME: 202309111835
ISSUE DATE / TIME: 202309130920
Unit Type and Rh: 5100
Unit Type and Rh: 5100
Unit Type and Rh: 5100
Unit Type and Rh: 5100

## 2022-07-27 LAB — TYPE AND SCREEN
ABO/RH(D): O POS
Antibody Screen: NEGATIVE
Unit division: 0
Unit division: 0
Unit division: 0
Unit division: 0

## 2022-07-27 LAB — BASIC METABOLIC PANEL
Anion gap: 4 — ABNORMAL LOW (ref 5–15)
BUN: 8 mg/dL (ref 8–23)
CO2: 17 mmol/L — ABNORMAL LOW (ref 22–32)
Calcium: 8.2 mg/dL — ABNORMAL LOW (ref 8.9–10.3)
Chloride: 119 mmol/L — ABNORMAL HIGH (ref 98–111)
Creatinine, Ser: 1.31 mg/dL — ABNORMAL HIGH (ref 0.44–1.00)
GFR, Estimated: 41 mL/min — ABNORMAL LOW (ref >60)
Glucose, Bld: 118 mg/dL — ABNORMAL HIGH (ref 70–99)
Potassium: 3.9 mmol/L (ref 3.5–5.1)
Sodium: 140 mmol/L (ref 135–145)

## 2022-07-27 LAB — CBC
HCT: 22.9 % — ABNORMAL LOW (ref 36.0–46.0)
Hemoglobin: 7.5 g/dL — ABNORMAL LOW (ref 12.0–15.0)
MCH: 30.1 pg (ref 26.0–34.0)
MCHC: 32.8 g/dL (ref 30.0–36.0)
MCV: 92 fL (ref 80.0–100.0)
Platelets: 131 10*3/uL — ABNORMAL LOW (ref 150–400)
RBC: 2.49 MIL/uL — ABNORMAL LOW (ref 3.87–5.11)
RDW: 13.2 % (ref 11.5–15.5)
WBC: 9.4 10*3/uL (ref 4.0–10.5)
nRBC: 0 % (ref 0.0–0.2)

## 2022-07-27 LAB — PREPARE RBC (CROSSMATCH)

## 2022-07-27 MED ORDER — ORAL CARE MOUTH RINSE: 15.0000 mL | OROMUCOSAL | Status: DC | PRN

## 2022-07-27 MED ORDER — PEG 3350-KCL-NA BICARB-NACL 420 G PO SOLR
4000.0000 mL | Freq: Once | ORAL | Status: AC
  Administered 2022-07-27: 4000 mL via ORAL
  Filled 2022-07-27: qty 4000

## 2022-07-27 MED ORDER — SODIUM CHLORIDE 0.9% IV SOLUTION: Freq: Once | INTRAVENOUS | Status: AC

## 2022-07-27 MED ORDER — PEG 3350-KCL-NA BICARB-NACL 420 G PO SOLR: 4000.0000 mL | Freq: Once | ORAL | Status: DC

## 2022-07-27 MED ORDER — PANTOPRAZOLE SODIUM 40 MG IV SOLR
40.0000 mg | Freq: Two times a day (BID) | INTRAVENOUS | Status: DC
  Administered 2022-07-27 – 2022-07-29 (×5): 40 mg via INTRAVENOUS
  Filled 2022-07-27 (×4): qty 10

## 2022-07-27 NOTE — Progress Notes (Signed)
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 She attributed the dark stool to being back on iron supplementation recently.  Had multiple similar episodes in the past.  In EL:FYBOFBPZ and saturating well on RA.BUN is 30 and creatinine 1.85.  Hemoglobin is 10.2,given a liter of normal saline, 80 mg IV Protonix>Protonix infusion, [facility] consulted and admitted.  9/10:Seen by GI placed on liquid diet but patient had large bowel movement, + has also dropped 10.2 to 8.5 g. CT bleeding scan no active gastrointestinal hemorrhage.  Overnight patient had syncopal episode with bowel movement transferred to stepdown  9/11-12: 3 syncopal episodes with 3 bowel movement.  2 units PRBC  9/13: clinically improving  9/14: Overnight tachycardia and syncopal episode at rest, 5 large bloody bowel movements and hemoglobin down to 7.5     Subjective:  Patient seen and examined.  Husband and daughter at the bedside.  Overnight events noted.  Had brisk lower GI bleeding and hemoglobin drifted down from 9.8-9.1-7.5.  Currently denies any complaints but anxious.  Multiple bloody bowel movements.  1 unit PRBC transfusion in process. Discussed in detail with patient and family, explained probable diverticular nature of bleeding.  May need colonoscopy.  Assessment and Plan: Principal Problem:   GI bleeding Active Problems:   Hypertension   OSA on CPAP   Acute GI bleeding   ABLA (acute blood loss anemia)   Stage 3b chronic kidney disease (CKD) (HCC)   Acute GI bleeding-Hematochezia/melanotic stool: Acute on chronic normocytic anemia Acute blood loss anemia: Reported baseline hemoglobin  11-12. Patient on fifth PRBC transfusion today.  Continue maintenance IV fluid. Likely diverticular bleed.  GI following.  Repeated bleeding scans were negative.  Anticipate colonoscopy tomorrow. We will continue to monitor hemoglobin every 12 hours.  We will transfuse to keep more than 8 due to ongoing bleeding. Recent Labs  Lab 07/25/22 1028 07/26/22 0249 07/26/22 1636 07/26/22 2243 07/27/22 0304  HGB 8.5* 7.3* 9.8* 9.1* 7.5*  HCT 25.7* 22.3* 29.4* 26.9* 22.9*    Syncopal episodes:during reclat bleeding: In the setting of #1. BP stable. Cont to monitor in tele.  HTN HLD: Hold antihypertensives-losartan and pravastatin  Hypomagnesemia: replaced.  CKD stage IIIb baseline creatinine around 1.4-1.6.stable.  Continue to monitor.  Recent Labs  Lab 07/23/22 1424 07/24/22 0340 07/25/22 0443 07/26/22 0249 07/27/22 0304  BUN '21 16 11 9 8  '$ CREATININE 1.43* 1.44* 1.24* 1.35* 1.31*    OSA:- CPAP qhs  Memory issues: hold Aricept.  DVT prophylaxis: SCDs Start: 07/23/22 0236 Code Status:   Code Status: Full Code Family Communication: plan of care discussed with patient/husband and daughter at the bedside.  Patient status is: inpatient  because of active GI bleeding Level of care: Stepdown  Dispo: The patient is from: home with husband            Anticipated disposition: TBD, start mobilizing.  Mobility Assessment (last 72 hours)     Mobility Assessment   No documentation.            Objective: Vitals last 24 hrs: Vitals:   07/27/22 0700 07/27/22 0800 07/27/22 0827 07/27/22 0849  BP: (!) 145/52 (!) 134/44 (!) 140/48 (!) 136/45  Pulse: 75 67  79 73  Resp: 12 12 (!) 4 12  Temp:  98.8 F (37.1 C) 98.1 F (36.7 C) 98.4 F (36.9 C)  TempSrc:  Oral Axillary   SpO2: 100% 100% 100%   Weight:      Height:       Weight change: 1.1 kg  Physical Examination:  General: Looks comfortable.  Slightly anxious. Cardiovascular: S1-S2 normal.  Regular rate  rhythm. Respiratory: Bilateral clear.  No added sounds. Gastrointestinal: Soft.  Nontender. Ext: No swelling or edema.  No cyanosis. Neuro: Alert and oriented.  No focal deficits. Musculoskeletal: No deformities.    Medications reviewed:  Scheduled Meds:  Chlorhexidine Gluconate Cloth  6 each Topical Daily   melatonin  3 mg Oral Once   pantoprazole (PROTONIX) IV  40 mg Intravenous Q12H   polyethylene glycol-electrolytes  4,000 mL Oral Once   polyvinyl alcohol  1 drop Both Eyes Daily   sodium chloride flush  3 mL Intravenous Q12H   Continuous Infusions:  sodium chloride 75 mL/hr at 07/27/22 0555      Diet Order             Diet NPO time specified  Diet effective midnight           Diet clear liquid Room service appropriate? Yes; Fluid consistency: Thin  Diet effective now                  Unresulted Labs (From admission, onward)     Start     Ordered   07/27/22 0230  CBC  Daily at 5am,   R     Question:  Specimen collection method  Answer:  Lab=Lab collect   07/27/22 0038   07/26/22 9509  Basic metabolic panel  Daily at 5am,   R     Question:  Specimen collection method  Answer:  Lab=Lab collect   07/25/22 0845          Data Reviewed: I have personally reviewed following labs and imaging studies CBC: Recent Labs  Lab 07/23/22 0035 07/23/22 1424 07/25/22 1028 07/26/22 0249 07/26/22 1636 07/26/22 2243 07/27/22 0304  WBC 7.0  --   --  7.6  --  8.9 9.4  HGB 10.2*   < > 8.5* 7.3* 9.8* 9.1* 7.5*  HCT 32.0*   < > 25.7* 22.3* 29.4* 26.9* 22.9*  MCV 95.5  --   --  92.9  --  91.2 92.0  PLT 257  --   --  145*  --  152 131*   < > = values in this interval not displayed.    Basic Metabolic Panel: Recent Labs  Lab 07/23/22 1424 07/24/22 0340 07/25/22 0443 07/26/22 0249 07/27/22 0304  NA 143 140 141 140 140  K 4.6 4.1 3.8 3.8 3.9  CL 118* 119* 119* 117* 119*  CO2 18* 16* 18* 17* 17*  GLUCOSE 127* 203* 120* 91 118*  BUN '21 16 11 9 8  '$ CREATININE 1.43*  1.44* 1.24* 1.35* 1.31*  CALCIUM 8.9 8.3* 8.5* 8.2* 8.2*  MG  --   --  1.5*  --   --     GFR: Estimated Creatinine Clearance: 24.9 mL/min (A) (by C-G formula based on SCr of 1.31 mg/dL (H)). Liver Function Tests: Recent Labs  Lab 07/23/22 0035  AST 17  ALT 10  ALKPHOS 63  BILITOT 0.4  PROT 7.7  ALBUMIN 4.3    No results for input(s): "LIPASE", "AMYLASE" in the last 168 hours. No results for  input(s): "AMMONIA" in the last 168 hours. Coagulation Profile: Recent Labs  Lab 07/23/22 0107  INR 1.3*    BNP (last 3 results) No results for input(s): "PROBNP" in the last 8760 hours. HbA1C: No results for input(s): "HGBA1C" in the last 72 hours. CBG: Recent Labs  Lab 07/26/22 1635 07/26/22 2112 07/26/22 2138 07/26/22 2352 07/27/22 0759  GLUCAP 97 60* 99 132* 91    Lipid Profile: No results for input(s): "CHOL", "HDL", "LDLCALC", "TRIG", "CHOLHDL", "LDLDIRECT" in the last 72 hours. Thyroid Function Tests: No results for input(s): "TSH", "T4TOTAL", "FREET4", "T3FREE", "THYROIDAB" in the last 72 hours. Sepsis Labs: No results for input(s): "PROCALCITON", "LATICACIDVEN" in the last 168 hours.  Recent Results (from the past 240 hour(s))  MRSA Next Gen by PCR, Nasal     Status: None   Collection Time: 07/24/22  3:49 AM   Specimen: Nasal Mucosa; Nasal Swab  Result Value Ref Range Status   MRSA by PCR Next Gen NOT DETECTED NOT DETECTED Final    Comment: (NOTE) The GeneXpert MRSA Assay (FDA approved for NASAL specimens only), is one component of a comprehensive MRSA colonization surveillance program. It is not intended to diagnose MRSA infection nor to guide or monitor treatment for MRSA infections. Test performance is not FDA approved in patients less than 89 years old. Performed at [facility], [place] [address] [place], [place] [address]     Antimicrobials: Anti-infectives (From admission, onward)    None      Culture/Microbiology     Component Value Date/Time   SDES URINE, CATHETERIZED 01/30/2017 1319   SPECREQUEST NONE 01/30/2017 1319   CULT (A) 01/30/2017 1319    20,000 COLONIES/mL PROTEUS MIRABILIS 60,000 COLONIES/mL ENTEROCOCCUS FAECALIS    REPTSTATUS 02/03/2017 FINAL 01/30/2017 1319    Radiology Studies: CT ANGIO GI BLEED  Result Date: 07/25/2022 CLINICAL DATA:  Hematochezia, possible diverticular bleed, status post transfusion EXAM: CTA ABDOMEN AND PELVIS WITHOUT AND WITH CONTRAST TECHNIQUE: Multidetector CT imaging of the abdomen and pelvis was performed using the standard protocol during bolus administration of intravenous contrast. Multiplanar reconstructed images and MIPs were obtained and reviewed to evaluate the vascular anatomy. RADIATION DOSE REDUCTION: This exam was performed according to the departmental dose-optimization program which includes automated exposure control, adjustment of the mA and/or kV according to patient size and/or use of iterative reconstruction technique. CONTRAST:  45m OMNIPAQUE IOHEXOL 350 MG/ML SOLN COMPARISON:  07/23/2022 FINDINGS: VASCULAR Normal contour and caliber of the abdominal aorta. No evidence of aneurysm, dissection, or other acute aortic pathology. Standard branching pattern of the abdominal aorta with solitary bilateral renal arteries. Moderate mixed calcific atherosclerosis. Branch vessel origins are patent. Review of the MIP images confirms the above findings. NON-VASCULAR Lower chest: No acute abnormality. Hepatobiliary: No solid liver abnormality is seen. Excreted contrast in the gallbladder. No gallstones, gallbladder wall thickening, or biliary dilatation. Pancreas: Unremarkable. No pancreatic ductal dilatation or surrounding inflammatory changes. Spleen: Normal in size without significant abnormality. Adrenals/Urinary Tract: Adrenal glands are unremarkable. Small nonobstructive bilateral renal calculi. No ureteral calculi or hydronephrosis. Bladder is unremarkable.  Stomach/Bowel: Stomach is within normal limits. Appendix appears normal. Status post sigmoid colon resection and reanastomosis. Pancolonic diverticulosis. High attenuation ingested material present in the terminal ileum (series 3, image 60), ascending and transverse colon (series 3, image 42, 33) on precontrast phase, without contrast blush or other evidence of contrast extravasation on arterial and portal venous phases. Lymphatic: No enlarged abdominal or pelvic lymph nodes. Reproductive: No mass or other  significant abnormality. Other: No abdominal wall hernia or abnormality. No ascites. Musculoskeletal: No acute or significant osseous findings. IMPRESSION: 1. Pancolonic diverticula. Status post sigmoid colon resection and reanastomosis. High attenuation ingested material scattered in the terminal ileum and colon, without evidence of contrast blush or intraluminal contrast extravasation to localize GI bleed. 2. Normal contour and caliber of the abdominal aorta. No evidence of aneurysm, dissection, or other acute aortic pathology. Moderate mixed calcific atherosclerosis. 3. Nonobstructive bilateral nephrolithiasis. Electronically Signed   By: Delanna Ahmadi M.D.   On: 07/25/2022 12:44     LOS: 2 days   Barb Merino, MD [facility]  07/27/2022, 10:30 AM

## 2022-07-27 NOTE — Progress Notes (Signed)
Pt refused CPAP. Pt not in any distress.

## 2022-07-27 NOTE — Progress Notes (Signed)
    OVERNIGHT PROGRESS REPORT   Notified by RN for syncope with Bradycardia during stool as has happened earlier in admission. CBC was ordered and resulted as 9.1 g/dL at 2230 Hrs.  Update   Patient had 2 additional stools total without syncope or bradycardia. Blood noted diluted in urine.(Unmeasurable)   Update 0406 hrs The follow up CBC resulted as 7.5 g/dL. There has been no additional stool or noted blood since 0000 hrs (07/27/22).   Additional unit of PRBC ordered due to drop in hgb in relation to previous GI approach at  7.2 g/dL.and considering the drop and appearance during the prior episode.   Gershon Cull MSNA MSN ACNPC-AG Acute Care Nurse Practitioner [place]

## 2022-07-27 NOTE — Progress Notes (Signed)
[facility] Gastroenterology Progress Note  Stephanie Barber 81 y.o. 21-Dec-1940   Subjective: Per nursing report 5 large bloody bowel movements yesterday.  Patient had episodes of tachycardia to 140, syncope, bradycardia to the 30s.  On exam this morning patient had no further bowel movements since the night, vital signs stable.  She denies abdominal pain.  Family at bedside.  ROS : Review of Systems  Gastrointestinal:  Positive for blood in stool. Negative for abdominal pain, constipation, diarrhea, heartburn, melena, nausea and vomiting.  Genitourinary:  Negative for dysuria and urgency.      Objective: Vital signs in last 24 hours: Vitals:   07/27/22 0827 07/27/22 0849  BP: (!) 140/48 (!) 136/45  Pulse: 79 73  Resp: (!) 4 12  Temp: 98.1 F (36.7 C) 98.4 F (36.9 C)  SpO2: 100%     Physical Exam:  General:  Alert, cooperative, no distress, appears stated age  Head:  Normocephalic, without obvious abnormality, atraumatic  Eyes:  Anicteric sclera, EOM's intact  Lungs:   Clear to auscultation bilaterally, respirations unlabored  Heart:  Regular rate and rhythm, S1, S2 normal  Abdomen:   Soft, non-tender, bowel sounds active all four quadrants,  no masses,   Extremities: Extremities normal, atraumatic, no  edema  Pulses: 2+ and symmetric    Lab Results: Recent Labs    07/25/22 0443 07/26/22 0249 07/27/22 0304  NA 141 140 140  K 3.8 3.8 3.9  CL 119* 117* 119*  CO2 18* 17* 17*  GLUCOSE 120* 91 118*  BUN '11 9 8  '$ CREATININE 1.24* 1.35* 1.31*  CALCIUM 8.5* 8.2* 8.2*  MG 1.5*  --   --    No results for input(s): "AST", "ALT", "ALKPHOS", "BILITOT", "PROT", "ALBUMIN" in the last 72 hours. Recent Labs    07/26/22 2243 07/27/22 0304  WBC 8.9 9.4  HGB 9.1* 7.5*  HCT 26.9* 22.9*  MCV 91.2 92.0  PLT 152 131*   No results for input(s): "LABPROT", "INR" in the last 72 hours.    Assessment Diverticular bleed   Hemoglobin decreased from 9.1-7.5 overnight.  She is  starting 5th unit packed red blood cells transfer this morning.  We will continue to monitor hemoglobin.  Hemoglobin this morning 7.5, she had several episodes of hematochezia overnight.  Vital signs stable when evaluated this morning.   Nuclear medicine GI bleeding scan 07/24/2022 Negative   CTA abdomen and pelvis 07/23/2022 No acute findings in the abdomen or pelvis. No CT findings for active gastrointestinal hemorrhage. Colonic diverticulosis without acute diverticulitis.   CTA abdomen and pelvis 07/25/2022 No CT findings for active gastrointestinal hemorrhage.     Patient with history of pandiverticulosis.  No findings of active GI bleed on CT scan x2 or NM bleeding scan.      Plan: Plan for Colonoscopy tentatively today or tomorrow. I thoroughly discussed the procedures to include nature, alternatives, benefits, and risks including but not limited to bleeding, perforation, infection, anesthesia/cardiac and pulmonary complications. Patient provides understanding and gave verbal consent to proceed. Continue Protonix 40 mg IV BID. Nulytely prep, clear liquid diet, NPO at midnight or 4 hours prior to procedure.  Continue daily CBC with transfusion as needed to maintain Hgb >7.  [facility] will follow.     Arvella Nigh Lene Mckay PA-C 07/27/2022, 10:18 AM  Contact #  [phone]

## 2022-07-27 NOTE — Progress Notes (Signed)
From 9pm to midnight, patient had total of 5 large bowels- dark red to red with clots approx 100-22m per bowel. Patient was tachycardic, HR=140 during bowel movement & turning. At 23:50 she had another syncope episode, HR drop to 27-34, BP also drop 115/37 during her last BM but went back to her baseline after. NP was aware and at bedside during the event. Hg redrawn.

## 2022-07-27 NOTE — Progress Notes (Signed)
Pt refused CPAP

## 2022-07-28 ENCOUNTER — Inpatient Hospital Stay (HOSPITAL_COMMUNITY): Payer: Medicare Other | Admitting: Certified Registered"

## 2022-07-28 ENCOUNTER — Encounter (HOSPITAL_COMMUNITY)
Admission: EM | Disposition: A | Discharge status: Home Health | Payer: Self-pay | Source: Home / Self Care | Attending: Internal Medicine

## 2022-07-28 ENCOUNTER — Encounter (HOSPITAL_COMMUNITY): Payer: Self-pay | Admitting: Internal Medicine

## 2022-07-28 DIAGNOSIS — D62 Acute posthemorrhagic anemia: Secondary | ICD-10-CM | POA: Diagnosis not present

## 2022-07-28 DIAGNOSIS — I10 Essential (primary) hypertension: Secondary | ICD-10-CM

## 2022-07-28 DIAGNOSIS — K5731 Diverticulosis of large intestine without perforation or abscess with bleeding: Secondary | ICD-10-CM

## 2022-07-28 DIAGNOSIS — G4733 Obstructive sleep apnea (adult) (pediatric): Secondary | ICD-10-CM | POA: Diagnosis not present

## 2022-07-28 DIAGNOSIS — K635 Polyp of colon: Secondary | ICD-10-CM

## 2022-07-28 DIAGNOSIS — K922 Gastrointestinal hemorrhage, unspecified: Secondary | ICD-10-CM | POA: Diagnosis not present

## 2022-07-28 DIAGNOSIS — N1832 Chronic kidney disease, stage 3b: Secondary | ICD-10-CM | POA: Diagnosis not present

## 2022-07-28 DIAGNOSIS — Z87891 Personal history of nicotine dependence: Secondary | ICD-10-CM

## 2022-07-28 HISTORY — PX: COLONOSCOPY: SHX5424

## 2022-07-28 HISTORY — PX: POLYPECTOMY: SHX5525

## 2022-07-28 LAB — TYPE AND SCREEN
ABO/RH(D): O POS
Antibody Screen: NEGATIVE
Unit division: 0

## 2022-07-28 LAB — GLUCOSE, CAPILLARY
Glucose-Capillary: 84 mg/dL (ref 70–99)
Glucose-Capillary: 79 mg/dL (ref 70–99)
Glucose-Capillary: 115 mg/dL — ABNORMAL HIGH (ref 70–99)
Glucose-Capillary: 111 mg/dL — ABNORMAL HIGH (ref 70–99)

## 2022-07-28 LAB — BASIC METABOLIC PANEL
Anion gap: 6 (ref 5–15)
BUN: 5 mg/dL — ABNORMAL LOW (ref 8–23)
CO2: 19 mmol/L — ABNORMAL LOW (ref 22–32)
Calcium: 8.7 mg/dL — ABNORMAL LOW (ref 8.9–10.3)
Chloride: 117 mmol/L — ABNORMAL HIGH (ref 98–111)
Creatinine, Ser: 1.18 mg/dL — ABNORMAL HIGH (ref 0.44–1.00)
GFR, Estimated: 46 mL/min — ABNORMAL LOW (ref >60)
Glucose, Bld: 84 mg/dL (ref 70–99)
Potassium: 3.6 mmol/L (ref 3.5–5.1)
Sodium: 142 mmol/L (ref 135–145)

## 2022-07-28 LAB — HEMOGLOBIN AND HEMATOCRIT, BLOOD
HCT: 30.2 % — ABNORMAL LOW (ref 36.0–46.0)
HCT: 26.6 % — ABNORMAL LOW (ref 36.0–46.0)
Hemoglobin: 10.4 g/dL — ABNORMAL LOW (ref 12.0–15.0)
Hemoglobin: 9.3 g/dL — ABNORMAL LOW (ref 12.0–15.0)

## 2022-07-28 LAB — BPAM RBC
Blood Product Expiration Date: 202310112359
ISSUE DATE / TIME: 202309140819
Unit Type and Rh: 5100

## 2022-07-28 SURGERY — COLONOSCOPY
Anesthesia: Monitor Anesthesia Care

## 2022-07-28 MED ORDER — SODIUM CHLORIDE 0.9 % IV SOLN: INTRAVENOUS | Status: DC

## 2022-07-28 MED ORDER — PROPOFOL 500 MG/50ML IV EMUL
INTRAVENOUS | Status: DC | PRN
  Administered 2022-07-28: 100 ug/kg/min via INTRAVENOUS

## 2022-07-28 MED ORDER — LIDOCAINE 2% (20 MG/ML) 5 ML SYRINGE
INTRAMUSCULAR | Status: DC | PRN
  Administered 2022-07-28: 40 mg via INTRAVENOUS

## 2022-07-28 MED ORDER — PROPOFOL 10 MG/ML IV BOLUS
INTRAVENOUS | Status: DC | PRN
  Administered 2022-07-28: 10 mg via INTRAVENOUS
  Administered 2022-07-28 (×2): 20 mg via INTRAVENOUS
  Administered 2022-07-28: 10 mg via INTRAVENOUS
  Administered 2022-07-28: 20 mg via INTRAVENOUS

## 2022-07-28 MED ORDER — LACTATED RINGERS IV SOLN: INTRAVENOUS | Status: DC

## 2022-07-28 MED ORDER — PROPOFOL 500 MG/50ML IV EMUL
INTRAVENOUS | Status: AC
  Filled 2022-07-28: qty 50

## 2022-07-28 MED ORDER — MELATONIN 3 MG PO TABS
3.0000 mg | ORAL_TABLET | Freq: Every evening | ORAL | Status: DC | PRN
  Administered 2022-07-28: 3 mg via ORAL
  Filled 2022-07-28: qty 1

## 2022-07-28 MED ORDER — LACTATED RINGERS IV SOLN: INTRAVENOUS | Status: DC | PRN

## 2022-07-28 MED ORDER — PROPOFOL 10 MG/ML IV BOLUS
INTRAVENOUS | Status: AC
  Filled 2022-07-28: qty 20

## 2022-07-28 NOTE — Op Note (Signed)
[facility] Patient Name: Stephanie Barber Procedure Date: 07/28/2022 MRN: [id number] Attending MD: Clarene Essex , MD Date of Birth: 10/24/1941 CSN: [id number] Age: 81 Admit Type: Inpatient Procedure:                Colonoscopy Indications:              Hematochezia, Acute post hemorrhagic anemia Providers:                Clarene Essex, MD, William Dalton, Technician, Carmie End, RN Referring MD:              Medicines:                Monitored Anesthesia Care Complications:            No immediate complications. Estimated Blood Loss:     Estimated blood loss: none. Procedure:                Pre-Anesthesia Assessment:                           - Prior to the procedure, a History and Physical                            was performed, and patient medications and                            allergies were reviewed. The patient's tolerance of                            previous anesthesia was also reviewed. The risks                            and benefits of the procedure and the sedation                            options and risks were discussed with the patient.                            All questions were answered, and informed consent                            was obtained. Prior Anticoagulants: The patient has                            taken no previous anticoagulant or antiplatelet                            agents except for NSAID medication. ASA Grade                            Assessment: III - A patient with severe systemic                            disease.  After reviewing the risks and benefits,                            the patient was deemed in satisfactory condition to                            undergo the procedure.                           After obtaining informed consent, the colonoscope                            was passed under direct vision. Throughout the                            procedure, the patient's blood  pressure, pulse, and                            oxygen saturations were monitored continuously. The                            [id number]-HQ190L ([id number]) Olympus colonoscope was                            introduced through the anus and advanced to the the                            terminal ileum. The terminal ileum, ileocecal                            valve, appendiceal orifice, and rectum were                            photographed. The colonoscopy was performed without                            difficulty. The patient tolerated the procedure                            well. The quality of the bowel preparation was                            adequate. Scope In: 1:25:04 PM Scope Out: 1:40:21 PM Scope Withdrawal Time: 0 hours 12 minutes 11 seconds  Total Procedure Duration: 0 hours 15 minutes 17 seconds  Findings:      Hemorrhoids were found on perianal exam.      There was evidence of a prior end-to-side colo-colonic anastomosis in       the recto-sigmoid colon. This was patent and was characterized by       healthy appearing mucosa. The anastomosis was traversed.      Scattered small-mouthed diverticula were found in the sigmoid colon,       descending colon, transverse colon, ascending colon and cecum.      The terminal ileum appeared normal.      A diminutive polyp was found  in the transverse colon. The polyp was       semi-sessile. It was not biopsied due to increased spasm and unable to       find again once spasm resolved      A small polyp was found in the descending colon. The polyp was       semi-sessile. Biopsies were taken with a cold forceps for histology.      The exam was otherwise without abnormality on direct and retroflexion       views. Impression:               - Hemorrhoids found on perianal exam.                           - Patent end-to-side colo-colonic anastomosis,                            characterized by healthy appearing mucosa.                           -  Diverticulosis in the sigmoid colon, in the                            descending colon, in the transverse colon, in the                            ascending colon and in the cecum.                           - The examined portion of the ileum was normal. No                            blood was seen coming from above                           - One diminutive polyp in the transverse colon.                           - One small polyp in the descending colon. Biopsied.                           - The examination was otherwise normal on direct                            and retroflexion views. No blood was seen                            throughout the procedure Moderate Sedation:      Not Applicable - Patient had care per Anesthesia. Recommendation:           - Soft diet today.                           - Continue present medications.                           -  Await pathology results.                           - Repeat colonoscopy PRN for surveillance based on                            pathology results.                           - Return to GI office PRN.                           - Telephone GI clinic for pathology results in 1                            week.                           - Telephone GI clinic if symptomatic PRN. Please                            call me this weekend if I can be of any further                            assistance with this hospital stay otherwise                            hopefully she will have no further bleeding                            overnight and can go home soon Procedure Code(s):        --- Professional ---                           [id number], Colonoscopy, flexible; with biopsy, single                            or multiple Diagnosis Code(s):        --- Professional ---                           Z98.0, Intestinal bypass and anastomosis status                           K63.5, Polyp of colon                           K92.1, Melena (includes  Hematochezia)                           D62, Acute posthemorrhagic anemia                           K57.30, Diverticulosis of large intestine without  perforation or abscess without bleeding CPT copyright 2019 [employer]. All rights reserved. The codes documented in this report are preliminary and upon coder review may  be revised to meet current compliance requirements. Clarene Essex, MD 07/28/2022 1:51:21 PM This report has been signed electronically. Number of Addenda: 0

## 2022-07-28 NOTE — Progress Notes (Signed)
Stephanie Barber 1:05 PM  Subjective: Patient doing well without any further bleeding and did well with the prep and has no new complaints  Objective: Vital signs stable afebrile no acute distress exam please see preassessment evaluation BUN and creatinine normal hemoglobin increased to 10.4  Assessment: Presumed seemingly stopped diverticular bleeding  Plan: I asked her if she would still like to proceed with a colonoscopy and she would and so we will proceed with anesthesia assistance with further work-up and plans pending those findings  [facility]  office [phone] After 5PM or if no answer call [phone]

## 2022-07-28 NOTE — Progress Notes (Signed)
Patient picked up/transported by endo RN, vital signs stable, patient alert/oriented.

## 2022-07-28 NOTE — Anesthesia Procedure Notes (Signed)
Procedure Name: MAC Date/Time: 07/28/2022 1:20 PM  Performed by: Eben Burow, CRNAPre-anesthesia Checklist: Patient identified, Emergency Drugs available, Suction available, Patient being monitored and Timeout performed Oxygen Delivery Method: Simple face mask Placement Confirmation: positive ETCO2

## 2022-07-28 NOTE — Transfer of Care (Signed)
Immediate Anesthesia Transfer of Care Note  Patient: Stephanie Barber  Procedure(s) Performed: COLONOSCOPY POLYPECTOMY  Patient Location: PACU and Endoscopy Unit  Anesthesia Type:MAC  Level of Consciousness: drowsy and responds to stimulation  Airway & Oxygen Therapy: Patient Spontanous Breathing and Patient connected to face mask oxygen  Post-op Assessment: Report given to RN and Post -op Vital signs reviewed and stable  Post vital signs: Reviewed and stable  Last Vitals:  Vitals Value Taken Time  BP    Temp    Pulse 83 07/28/22 1349  Resp 19 07/28/22 1349  SpO2 100 % 07/28/22 1349  Vitals shown include unvalidated device data.  Last Pain:  Vitals:   07/28/22 1304  TempSrc:   PainSc: 0-No pain         Complications: No notable events documented.

## 2022-07-28 NOTE — Anesthesia Preprocedure Evaluation (Addendum)
Anesthesia Evaluation  Patient identified by MRN, date of birth, ID band Patient awake    Reviewed: Allergy & Precautions, NPO status , Patient's Chart, lab work & pertinent test results, reviewed documented beta blocker date and time   History of Anesthesia Complications (+) history of anesthetic complications  Airway Mallampati: III  TM Distance: >3 FB Neck ROM: Full    Dental  (+) Missing, Dental Advisory Given, Poor Dentition,  Denies any loose teeth:   Pulmonary sleep apnea (non compliant w/ CPAP) and Continuous Positive Airway Pressure Ventilation , former smoker,    Pulmonary exam normal breath sounds clear to auscultation       Cardiovascular hypertension, Pt. on medications and Pt. on home beta blockers + DVT (xarelto, LD 06/14/21)  Normal cardiovascular exam Rhythm:Regular Rate:Normal     Neuro/Psych  Headaches,  Neuromuscular disease negative psych ROS   GI/Hepatic Neg liver ROS, GERD  Controlled,  Endo/Other  diabetes, Well Controlled, Type 2, Oral Hypoglycemic Agents  Renal/GU Renal InsufficiencyRenal disease  negative genitourinary   Musculoskeletal  (+) Arthritis , Osteoarthritis,    Abdominal   Peds  Hematology  (+) Blood dyscrasia, anemia ,   Anesthesia Other Findings   Reproductive/Obstetrics negative OB ROS                             Anesthesia Physical  Anesthesia Plan  ASA: 3  Anesthesia Plan: MAC   Post-op Pain Management: Minimal or no pain anticipated   Induction: Intravenous  PONV Risk Score and Plan: 2 and Propofol infusion  Airway Management Planned: Natural Airway and Simple Face Mask  Additional Equipment: None  Intra-op Plan:   Post-operative Plan:   Informed Consent: I have reviewed the patients History and Physical, chart, labs and discussed the procedure including the risks, benefits and alternatives for the proposed anesthesia with the  patient or authorized representative who has indicated his/her understanding and acceptance.     Dental advisory given  Plan Discussed with: CRNA and Anesthesiologist  Anesthesia Plan Comments:        Anesthesia Quick Evaluation

## 2022-07-28 NOTE — Anesthesia Postprocedure Evaluation (Signed)
Anesthesia Post Note  Patient: Stephanie Barber  Procedure(s) Performed: COLONOSCOPY POLYPECTOMY     Patient location during evaluation: Endoscopy Anesthesia Type: MAC Level of consciousness: awake and alert Pain management: pain level controlled Vital Signs Assessment: post-procedure vital signs reviewed and stable Respiratory status: spontaneous breathing, nonlabored ventilation, respiratory function stable and patient connected to nasal cannula oxygen Cardiovascular status: blood pressure returned to baseline and stable Postop Assessment: no apparent nausea or vomiting Anesthetic complications: no   No notable events documented.  Last Vitals:  Vitals:   07/28/22 1355 07/28/22 1400  BP:  (!) 148/48  Pulse: 74 69  Resp: 15 (!) 26  Temp:    SpO2: 100% 97%    Last Pain:  Vitals:   07/28/22 1355  TempSrc:   PainSc: 0-No pain                 Evert Wenrich DANIEL

## 2022-07-28 NOTE — Progress Notes (Signed)
PROGRESS NOTE Stephanie Barber  GEZ:[id number] DOB: 1941/03/25 DOA: 07/23/2022 PCP: [name], MD   Brief Narrative/Hospital Course: 81 y.o.f with PMH of  HTN/HLD, OSA, history of DVT no longer anticoagulated,and history of GI bleeding  Presented to the ED with rectal bleeding large-volume reddish maroonish stool with few clots then following day another episode. She attributed the dark stool to being back on iron supplementation recently.  Had multiple similar episodes in the past.  In YT:[id number] and saturating well on RA.BUN is 30 and creatinine 1.85.  Hemoglobin is 10.2,given a liter of normal saline, 80 mg IV Protonix>Protonix infusion, [facility] consulted and admitted.  9/10:Seen by GI placed on liquid diet but patient had large bowel movement, + has also dropped 10.2 to 8.5 g. CT bleeding scan no active gastrointestinal hemorrhage.  Overnight patient had syncopal episode with bowel movement transferred to stepdown  9/11-12: 3 syncopal episodes with 3 bowel movement.  2 units PRBC  9/13: clinically improving  9/14: Overnight tachycardia and syncopal episode at rest, 5 large bloody bowel movements and hemoglobin down to 7.5-appropriately responded to 1 unit PRBC.  No overnight events.  Preparing for colonoscopy today.     Subjective:  Patient seen and examined.  Husband at the bedside.  No overnight events.  No more palpitations or syncopal episodes.  Multiple clear bowel movements after bowel prep.  No evidence of blood since yesterday morning.  Assessment and Plan: Principal Problem:   GI bleeding Active Problems:   Hypertension   OSA on CPAP   Acute GI bleeding   ABLA (acute blood loss anemia)   Stage 3b chronic kidney disease (CKD) (HCC)   Acute GI bleeding-Hematochezia/melanotic stool: Acute on chronic normocytic anemia Acute blood loss anemia: Reported baseline hemoglobin 11-12. Received total 5 units of PRBC.  Continue maintenance IV fluid. Likely diverticular bleed.   Intermittently bleeding.  Repeated bleeding scans were negative.  Prepare for colonoscopy today. We will continue to monitor hemoglobin every 12 hours.  We will transfuse to keep more than 8 due to ongoing bleeding. Recent Labs  Lab 07/26/22 2243 07/27/22 0304 07/27/22 1453 07/27/22 2012 07/28/22 0737  HGB 9.1* 7.5* 9.6* 9.2* 10.4*  HCT 26.9* 22.9* 27.8* 27.2* 30.2*   Syncopal episodes:during reclat bleeding: In the setting of #1. BP stable. Cont to monitor in tele.  HTN HLD: Hold antihypertensives-losartan and pravastatin  Hypomagnesemia: replaced.  Adequate.  CKD stage IIIb baseline creatinine around 1.4-1.6.stable.  Continue to monitor.  Adequate. Recent Labs  Lab 07/24/22 0340 07/25/22 0443 07/26/22 0249 07/27/22 0304 07/28/22 0737  BUN '16 11 9 8 '$ 5*  CREATININE 1.44* 1.24* 1.35* 1.31* 1.18*   OSA:- CPAP qhs  Memory issues: hold Aricept.  DVT prophylaxis: SCDs Start: 07/23/22 0236 Code Status:   Code Status: Full Code Family Communication: Husband at the bedside.  Patient status is: inpatient  because of active GI bleeding Level of care: Stepdown  Dispo: The patient is from: home with husband            Anticipated disposition: TBD, not medically stable yet.  Mobility Assessment (last 72 hours)     Mobility Assessment   No documentation.            Objective: Vitals last 24 hrs: Vitals:   07/28/22 0400 07/28/22 0500 07/28/22 0600 07/28/22 0814  BP: (!) 163/60 (!) 162/73 (!) 143/58   Pulse: 61 61 68   Resp: 10 (!) 6 (!) 9   Temp:    98.7 F (  37.1 C)  TempSrc:    Oral  SpO2: 100% 98% 97%   Weight: 54.2 kg     Height:       Weight change: 0 kg  Physical Examination:  General: Frail and debilitated.  Looks comfortable today.  Pleasant and interactive. Cardiovascular: S1-S2 normal.  Regular rate rhythm. Respiratory: Bilateral clear.  No added sounds. Gastrointestinal: Soft.  Nontender.  Bowel sound present. Ext: No swelling or edema.  No  cyanosis. Neuro: Alert and awake.  No focal deficits.    Medications reviewed:  Scheduled Meds:  Chlorhexidine Gluconate Cloth  6 each Topical Daily   melatonin  3 mg Oral Once   pantoprazole (PROTONIX) IV  40 mg Intravenous Q12H   polyvinyl alcohol  1 drop Both Eyes Daily   sodium chloride flush  3 mL Intravenous Q12H   Continuous Infusions:  sodium chloride 75 mL/hr at 07/28/22 0700      Diet Order             Diet NPO time specified  Diet effective midnight                  Unresulted Labs (From admission, onward)     Start     Ordered   07/27/22 2000  Hemoglobin and hematocrit, blood  5A & 5P,   R     Question:  Specimen collection method  Answer:  Lab=Lab collect   07/27/22 1553   07/26/22 2979  Basic metabolic panel  Daily at 5am,   R     Question:  Specimen collection method  Answer:  Lab=Lab collect   07/25/22 0845          Data Reviewed: I have personally reviewed following labs and imaging studies CBC: Recent Labs  Lab 07/23/22 0035 07/23/22 1424 07/26/22 0249 07/26/22 1636 07/26/22 2243 07/27/22 0304 07/27/22 1453 07/27/22 2012 07/28/22 0737  WBC 7.0  --  7.6  --  8.9 9.4  --   --   --   HGB 10.2*   < > 7.3*   < > 9.1* 7.5* 9.6* 9.2* 10.4*  HCT 32.0*   < > 22.3*   < > 26.9* 22.9* 27.8* 27.2* 30.2*  MCV 95.5  --  92.9  --  91.2 92.0  --   --   --   PLT 257  --  145*  --  152 131*  --   --   --    < > = values in this interval not displayed.   Basic Metabolic Panel: Recent Labs  Lab 07/24/22 0340 07/25/22 0443 07/26/22 0249 07/27/22 0304 07/28/22 0737  NA 140 141 140 140 142  K 4.1 3.8 3.8 3.9 3.6  CL 119* 119* 117* 119* 117*  CO2 16* 18* 17* 17* 19*  GLUCOSE 203* 120* 91 118* 84  BUN '16 11 9 8 '$ 5*  CREATININE 1.44* 1.24* 1.35* 1.31* 1.18*  CALCIUM 8.3* 8.5* 8.2* 8.2* 8.7*  MG  --  1.5*  --   --   --    GFR: Estimated Creatinine Clearance: 27.6 mL/min (A) (by C-G formula based on SCr of 1.18 mg/dL (H)). Liver Function  Tests: Recent Labs  Lab 07/23/22 0035  AST 17  ALT 10  ALKPHOS 63  BILITOT 0.4  PROT 7.7  ALBUMIN 4.3   No results for input(s): "LIPASE", "AMYLASE" in the last 168 hours. No results for input(s): "AMMONIA" in the last 168 hours. Coagulation Profile: Recent Labs  Lab 07/23/22 0107  INR 1.3*   BNP (last 3 results) No results for input(s): "PROBNP" in the last 8760 hours. HbA1C: No results for input(s): "HGBA1C" in the last 72 hours. CBG: Recent Labs  Lab 07/26/22 2352 07/27/22 0759 07/27/22 1733 07/27/22 2135 07/28/22 0815  GLUCAP 132* 91 71 93 84   Lipid Profile: No results for input(s): "CHOL", "HDL", "LDLCALC", "TRIG", "CHOLHDL", "LDLDIRECT" in the last 72 hours. Thyroid Function Tests: No results for input(s): "TSH", "T4TOTAL", "FREET4", "T3FREE", "THYROIDAB" in the last 72 hours. Sepsis Labs: No results for input(s): "PROCALCITON", "LATICACIDVEN" in the last 168 hours.  Recent Results (from the past 240 hour(s))  MRSA Next Gen by PCR, Nasal     Status: None   Collection Time: 07/24/22  3:49 AM   Specimen: Nasal Mucosa; Nasal Swab  Result Value Ref Range Status   MRSA by PCR Next Gen NOT DETECTED NOT DETECTED Final    Comment: (NOTE) The GeneXpert MRSA Assay (FDA approved for NASAL specimens only), is one component of a comprehensive MRSA colonization surveillance program. It is not intended to diagnose MRSA infection nor to guide or monitor treatment for MRSA infections. Test performance is not FDA approved in patients less than 7 years old. Performed at [facility], [place] [address] [place], [place] [address]     Antimicrobials: Anti-infectives (From admission, onward)    None      Culture/Microbiology    Component Value Date/Time   SDES URINE, CATHETERIZED 01/30/2017 1319   SPECREQUEST NONE 01/30/2017 1319   CULT (A) 01/30/2017 1319    20,000 COLONIES/mL PROTEUS MIRABILIS 60,000 COLONIES/mL ENTEROCOCCUS FAECALIS     REPTSTATUS 02/03/2017 FINAL 01/30/2017 1319    Radiology Studies: No results found.   LOS: 3 days   Barb Merino, MD [facility]  07/28/2022, 10:59 AM

## 2022-07-29 DIAGNOSIS — N1832 Chronic kidney disease, stage 3b: Secondary | ICD-10-CM | POA: Diagnosis not present

## 2022-07-29 DIAGNOSIS — D62 Acute posthemorrhagic anemia: Secondary | ICD-10-CM | POA: Diagnosis not present

## 2022-07-29 DIAGNOSIS — K922 Gastrointestinal hemorrhage, unspecified: Secondary | ICD-10-CM | POA: Diagnosis not present

## 2022-07-29 LAB — BASIC METABOLIC PANEL
Anion gap: 5 (ref 5–15)
BUN: 8 mg/dL (ref 8–23)
CO2: 22 mmol/L (ref 22–32)
Calcium: 8.2 mg/dL — ABNORMAL LOW (ref 8.9–10.3)
Chloride: 115 mmol/L — ABNORMAL HIGH (ref 98–111)
Creatinine, Ser: 1.52 mg/dL — ABNORMAL HIGH (ref 0.44–1.00)
GFR, Estimated: 34 mL/min — ABNORMAL LOW (ref >60)
Glucose, Bld: 101 mg/dL — ABNORMAL HIGH (ref 70–99)
Potassium: 3.4 mmol/L — ABNORMAL LOW (ref 3.5–5.1)
Sodium: 142 mmol/L (ref 135–145)

## 2022-07-29 LAB — GLUCOSE, CAPILLARY
Glucose-Capillary: 93 mg/dL (ref 70–99)
Glucose-Capillary: 83 mg/dL (ref 70–99)

## 2022-07-29 LAB — HEMOGLOBIN AND HEMATOCRIT, BLOOD
HCT: 25.5 % — ABNORMAL LOW (ref 36.0–46.0)
Hemoglobin: 8.7 g/dL — ABNORMAL LOW (ref 12.0–15.0)

## 2022-07-29 MED ORDER — POTASSIUM CHLORIDE CRYS ER 20 MEQ PO TBCR
20.0000 meq | EXTENDED_RELEASE_TABLET | Freq: Two times a day (BID) | ORAL | Status: DC
  Administered 2022-07-29: 20 meq via ORAL
  Filled 2022-07-29: qty 1

## 2022-07-29 MED ORDER — AMLODIPINE BESYLATE 5 MG PO TABS
5.0000 mg | ORAL_TABLET | Freq: Every day | ORAL | Status: DC
  Administered 2022-07-29: 5 mg via ORAL
  Filled 2022-07-29: qty 1

## 2022-07-29 MED ORDER — PANTOPRAZOLE SODIUM 40 MG PO TBEC: 40.0000 mg | DELAYED_RELEASE_TABLET | Freq: Every day | ORAL | 11 refills | Status: DC

## 2022-07-29 NOTE — Progress Notes (Signed)
Stephanie Barber 8:00 AM  Subjective: Patient doing well without a post colonoscopy problem and no signs of bleeding and case discussed with her husband and we answered all their questions and no new complaints  Objective: Vital signs stable afebrile no acute distress abdomen is soft nontender slight increase creatinine hemoglobin fairly stable  Assessment: Resolved diverticular bleeding  Plan: Okay with me to go home but will leave care to primary team and she can follow-up in a few weeks or as needed with my partner Dr. Michail Sermon  [facility]  office [phone] After 5PM or if no answer call [phone]

## 2022-07-29 NOTE — Discharge Summary (Signed)
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  In the emergency room initial hemoglobin was 10.2, she had subsequent multiple intermittent lower GI bleed and hemoglobin dropped less than 6.  Required total 5 units of PRBC.  She had 2 bleeding scans that were negative for active bleeding.  Was treated at stepdown unit with IV fluids and blood transfusions.  Ultimately underwent colonoscopy on 9/15 with diverticulosis but no active bleeding.  Since last 48 hours, she has no evidence of clinical bleeding, stool without blood and hemoglobin has remained stable since then.  Massive lower GI bleeding, likely diverticular bleeding self-limiting: Hemodynamically stabilized.  Active bleeding has stabilized now. Able to go home today.  Will need very close follow-up.  Will advise repeat hemoglobin check in 1 week. Blood pressure stabilized, advised to resume amlodipine and metoprolol but to hold losartan hydrochlorothiazide given AKI and risk of hypotension. She can resume statin. Kidney functions at about baseline. Will avoid NSAIDs including meloxicam. Due to prolonged hospitalization, patient is deconditioned and she will benefit with home health PT  OT.   Discharge Diagnoses:  Principal Problem:   GI bleeding Active Problems:   Hypertension   OSA on CPAP   Acute GI bleeding   ABLA (acute blood loss anemia)   Stage 3b chronic kidney disease (CKD) ([place])    Discharge Instructions  Discharge Instructions     Diet - low sodium heart healthy   Complete by: As directed    Increase activity slowly   Complete by: As directed       Allergies as of 07/29/2022       Reactions   Morphine And Related Anaphylaxis   Lactose Intolerance (gi) Diarrhea, Nausea Only        Medication List     STOP taking these medications    losartan 100 MG tablet Commonly known as: COZAAR   meloxicam 15 MG tablet Commonly known as: MOBIC       TAKE these medications    acetaminophen 650 MG CR tablet Commonly known as: TYLENOL Take 650 mg by mouth in the morning.   acetaminophen 500 MG tablet Commonly known as: TYLENOL Take 500 mg by mouth every evening.   amLODipine 10 MG tablet Commonly known as: NORVASC Take 10 mg by mouth in the morning.   CINNAMON PO Take 1 capsule by mouth daily.   Digestive Enzymes Caps Take 1 capsule by mouth 2 (two) times daily.   donepezil 10 MG tablet Commonly known as: ARICEPT Take 10 mg by mouth at bedtime.   Iron 325 (65 Fe) MG Tabs Take 1 tablet (325 mg total) by mouth daily. What changed: when to take this   lipase/protease/amylase 36000 UNITS Cpep capsule Commonly known as: CREON Take 36,000-72,000 Units by mouth See admin instructions. 2 capsules (72000 units) 3 times daily with meals, 1 capsule (36000 units) with  snacks   loperamide 2 MG capsule Commonly known as: IMODIUM Take 2 mg by mouth 4 (four) times daily as needed for diarrhea or loose stools.   metoprolol succinate 50 MG 24 hr tablet Commonly known as: TOPROL-XL Take 50 mg by mouth in the morning.   OVER THE COUNTER MEDICATION Take 1-2 capsules by mouth at bedtime. Relaxium Sleep supplement   pantoprazole 40 MG  tablet Commonly known as: Protonix Take 1 tablet (40 mg total) by mouth daily.   Polyethyl Glycol-Propyl Glycol 0.4-0.3 % Soln Place 1 drop into both eyes daily.   pravastatin 40 MG tablet Commonly known as: PRAVACHOL Take 40 mg by mouth at bedtime.        Allergies  Allergen Reactions   Morphine And Related Anaphylaxis   Lactose Intolerance (Gi) Diarrhea and Nausea Only    Consultations: Gastroenterology   Procedures/Studies: CT ANGIO GI BLEED  Result Date: 07/25/2022 CLINICAL DATA:  Hematochezia, possible diverticular bleed, status post transfusion EXAM: CTA ABDOMEN AND PELVIS WITHOUT AND WITH CONTRAST TECHNIQUE: Multidetector CT imaging of the abdomen and pelvis was performed using the standard protocol during bolus administration of intravenous contrast. Multiplanar reconstructed images and MIPs were obtained and reviewed to evaluate the vascular anatomy. RADIATION DOSE REDUCTION: This exam was performed according to the departmental dose-optimization program which includes automated exposure control, adjustment of the mA and/or kV according to patient size and/or use of iterative reconstruction technique. CONTRAST:  21m OMNIPAQUE IOHEXOL 350 MG/ML SOLN COMPARISON:  07/23/2022 FINDINGS: VASCULAR Normal contour and caliber of the abdominal aorta. No evidence of aneurysm, dissection, or other acute aortic pathology. Standard branching pattern of the abdominal aorta with solitary bilateral renal arteries. Moderate mixed calcific atherosclerosis. Branch vessel origins are patent. Review of the MIP images confirms the above findings. NON-VASCULAR Lower chest: No acute abnormality. Hepatobiliary: No solid liver abnormality is seen. Excreted contrast in the gallbladder. No gallstones, gallbladder wall thickening, or biliary dilatation. Pancreas: Unremarkable. No pancreatic ductal dilatation or surrounding inflammatory changes. Spleen: Normal in size without significant abnormality.  Adrenals/Urinary Tract: Adrenal glands are unremarkable. Small nonobstructive bilateral renal calculi. No ureteral calculi or hydronephrosis. Bladder is unremarkable. Stomach/Bowel: Stomach is within normal limits. Appendix appears normal. Status post sigmoid colon resection and reanastomosis. Pancolonic diverticulosis. High attenuation ingested material present in the terminal ileum (series 3, image 60), ascending and transverse colon (series 3, image 42, 33) on precontrast phase, without contrast blush or other evidence of contrast extravasation on arterial and portal venous phases. Lymphatic: No enlarged abdominal or pelvic lymph nodes. Reproductive: No mass or other significant abnormality. Other: No abdominal wall hernia or abnormality. No ascites. Musculoskeletal: No acute or significant osseous findings. IMPRESSION: 1. Pancolonic diverticula. Status post sigmoid colon resection and reanastomosis. High attenuation ingested material scattered in the terminal ileum and colon, without evidence of contrast blush or intraluminal contrast extravasation to localize GI bleed. 2. Normal contour and caliber of the abdominal aorta. No evidence of aneurysm, dissection, or other acute aortic pathology. Moderate mixed calcific atherosclerosis. 3. Nonobstructive bilateral nephrolithiasis. Electronically Signed   By: ADelanna AhmadiM.D.   On: 07/25/2022 12:44   NM GI Blood Loss  Result Date: 07/24/2022 CLINICAL DATA:  Lower GI bleeding, multiple episodes of dark red blood per rectum EXAM: NUCLEAR MEDICINE GASTROINTESTINAL BLEEDING SCAN TECHNIQUE: Sequential abdominal images were obtained following intravenous administration of Tc-957mabeled red blood cells. RADIOPHARMACEUTICALS:  22.6 mCi Tc-9954mrtechnetate in-vitro labeled red cells. COMPARISON:  None Available. FINDINGS: Normal, expected blood pool, urinary  tract, and soft tissue radiotracer activity. No accumulating or peristalsing radiotracer activity to localize  nidus of GI bleeding. IMPRESSION: Negative scintigraphic nuclear GI bleed examination. Electronically Signed   By: Delanna Ahmadi M.D.   On: 07/24/2022 13:51   CT Angio Abd/Pel w/ and/or w/o  Result Date: 07/23/2022 CLINICAL DATA:  Hematochezia EXAM: CTA ABDOMEN AND PELVIS WITHOUT AND WITH CONTRAST TECHNIQUE: Multidetector CT imaging of the abdomen and pelvis was performed using the standard protocol during bolus administration of intravenous contrast. Multiplanar reconstructed images and MIPs were obtained and reviewed to evaluate the vascular anatomy. RADIATION DOSE REDUCTION: This exam was performed according to the departmental dose-optimization program which includes automated exposure control, adjustment of the mA and/or kV according to patient size and/or use of iterative reconstruction technique. CONTRAST:  42m OMNIPAQUE IOHEXOL 350 MG/ML SOLN COMPARISON:  CT 06/15/2021 FINDINGS: VASCULAR Aorta: Moderate atherosclerotic plaque.  No aneurysm or dissection. Celiac: Patent.  No aneurysm or dissection. SMA: Patent.  No aneurysm or dissection. Renals: Mild narrowing of the proximal right renal artery secondary to calcified atherosclerotic plaque. The left renal artery is patent. IMA: Patent. Inflow: Mild plaque without significant narrowing. No aneurysm or dissection. Proximal Outflow: Patent.  No aneurysm or dissection. Veins: No obvious venous abnormality within the limitations of this arterial phase study. Review of the MIP images confirms the above findings. NON-VASCULAR Lower chest: No acute abnormality. Hepatobiliary: No focal liver abnormality is seen. No gallstones, gallbladder wall thickening, or biliary dilatation. Pancreas: Unremarkable. No pancreatic ductal dilatation or surrounding inflammatory changes. Spleen: Normal in size without focal abnormality. Adrenals/Urinary Tract: Adrenal glands are unremarkable. Bilateral cortical renal scarring. Nonobstructing stones in both kidneys with the largest  in the upper pole of the left kidney measuring 7 mm. Low-attenuation lesions in the kidneys are statistically likely to represent cysts. No follow-up is required. No hydronephrosis. Unremarkable bladder for degree of distention. Stomach/Bowel: Stomach is unremarkable. Normal caliber small bowel and colon. Postoperative changes about the distal sigmoid colon/rectum. Colonic diverticulosis without diverticulitis. Appendix is unremarkable. No evidence of active contrast extravasation into the lumen of the large or small bowel. Lymphatic: No suspicious lymphadenopathy. Reproductive: Unremarkable. Other: No free intraperitoneal fluid or air. Musculoskeletal: Demineralization.  No acute fracture. IMPRESSION: VASCULAR No CT findings for active gastrointestinal hemorrhage. Aortic Atherosclerosis (ICD10-I70.0). NON-VASCULAR No acute findings in the abdomen or pelvis. Colonic diverticulosis without acute diverticulitis. Electronically Signed   By: TPlacido SouM.D.   On: 07/23/2022 19:38   (Echo, Carotid, EGD, Colonoscopy, ERCP)    Subjective: Patient seen in the morning rounds.  Up about eating breakfast.  Eager to go home.  Husband at the bedside.  She had 1 small loose bowel movement after colonoscopy yesterday.  Denies any nausea vomiting, abdominal pain or cramping.   Discharge Exam: Vitals:   07/29/22 0800 07/29/22 1041  BP: (!) 162/75 (!) 148/62  Pulse: 79   Resp: 18   Temp:    SpO2: 100%    Vitals:   07/29/22 0600 07/29/22 0700 07/29/22 0800 07/29/22 1041  BP: (!) 183/71 (!) 162/74 (!) 162/75 (!) 148/62  Pulse: 71 78 79   Resp: '17 17 18   '$ Temp:  98.4 F (36.9 C)    TempSrc:  Oral    SpO2: 100% 100% 100%   Weight:      Height:        General: Pt is alert, awake, not in acute distress Cardiovascular: RRR, S1/S2 +, no rubs, no gallops Respiratory: CTA bilaterally, no wheezing,  no rhonchi Abdominal: Soft, NT, ND, bowel sounds + Extremities: no edema, no cyanosis    The results of  significant diagnostics from this hospitalization (including imaging, microbiology, ancillary and laboratory) are listed below for reference.     Microbiology: Recent Results (from the past 240 hour(s))  MRSA Next Gen by PCR, Nasal     Status: None   Collection Time: 07/24/22  3:49 AM   Specimen: Nasal Mucosa; Nasal Swab  Result Value Ref Range Status   MRSA by PCR Next Gen NOT DETECTED NOT DETECTED Final    Comment: (NOTE) The GeneXpert MRSA Assay (FDA approved for NASAL specimens only), is one component of a comprehensive MRSA colonization surveillance program. It is not intended to diagnose MRSA infection nor to guide or monitor treatment for MRSA infections. Test performance is not FDA approved in patients less than 71 years old. Performed at [facility], [place] [address] [place], [place] [address]      Labs: BNP (last 3 results) No results for input(s): "BNP" in the last 8760 hours. Basic Metabolic Panel: Recent Labs  Lab 07/25/22 0443 07/26/22 0249 07/27/22 0304 07/28/22 0737 07/29/22 0510  NA 141 140 140 142 142  K 3.8 3.8 3.9 3.6 3.4*  CL 119* 117* 119* 117* 115*  CO2 18* 17* 17* 19* 22  GLUCOSE 120* 91 118* 84 101*  BUN '11 9 8 '$ 5* 8  CREATININE 1.24* 1.35* 1.31* 1.18* 1.52*  CALCIUM 8.5* 8.2* 8.2* 8.7* 8.2*  MG 1.5*  --   --   --   --    Liver Function Tests: Recent Labs  Lab 07/23/22 0035  AST 17  ALT 10  ALKPHOS 63  BILITOT 0.4  PROT 7.7  ALBUMIN 4.3   No results for input(s): "LIPASE", "AMYLASE" in the last 168 hours. No results for input(s): "AMMONIA" in the last 168 hours. CBC: Recent Labs  Lab 07/23/22 0035 07/23/22 1424 07/26/22 0249 07/26/22 1636 07/26/22 2243 07/27/22 0304 07/27/22 1453 07/27/22 2012 07/28/22 0737 07/28/22 1649 07/29/22 0510  WBC 7.0  --  7.6  --  8.9 9.4  --   --   --   --   --   HGB 10.2*   < > 7.3*   < > 9.1* 7.5* 9.6* 9.2* 10.4* 9.3* 8.7*  HCT 32.0*   < > 22.3*   < > 26.9* 22.9* 27.8* 27.2*  30.2* 26.6* 25.5*  MCV 95.5  --  92.9  --  91.2 92.0  --   --   --   --   --   PLT 257  --  145*  --  152 131*  --   --   --   --   --    < > = values in this interval not displayed.   Cardiac Enzymes: No results for input(s): "CKTOTAL", "CKMB", "CKMBINDEX", "TROPONINI" in the last 168 hours. BNP: Invalid input(s): "POCBNP" CBG: Recent Labs  Lab 07/28/22 0815 07/28/22 1138 07/28/22 1648 07/28/22 2224 07/29/22 0809  GLUCAP 84 79 115* 111* 93   D-Dimer No results for input(s): "DDIMER" in the last 72 hours. Hgb A1c No results for input(s): "HGBA1C" in the last 72 hours. Lipid Profile No results for input(s): "CHOL", "HDL", "LDLCALC", "TRIG", "CHOLHDL", "LDLDIRECT" in the last 72 hours. Thyroid function studies No results for input(s): "TSH", "T4TOTAL", "T3FREE", "THYROIDAB" in the last 72 hours.  Invalid input(s): "FREET3" Anemia work up No results for input(s): "VITAMINB12", "FOLATE", "FERRITIN", "TIBC", "IRON", "RETICCTPCT" in  the last 72 hours. Urinalysis    Component Value Date/Time   COLORURINE YELLOW 04/08/2022 1411   APPEARANCEUR CLEAR 04/08/2022 1411   LABSPEC 1.016 04/08/2022 1411   PHURINE 5.0 04/08/2022 1411   GLUCOSEU NEGATIVE 04/08/2022 1411   Regino Ramirez 04/08/2022 1411   BILIRUBINUR NEGATIVE 04/08/2022 1411   KETONESUR NEGATIVE 04/08/2022 1411   PROTEINUR NEGATIVE 04/08/2022 1411   UROBILINOGEN 0.2 05/20/2009 1405   NITRITE NEGATIVE 04/08/2022 1411   LEUKOCYTESUR TRACE (A) 04/08/2022 1411   Sepsis Labs Recent Labs  Lab 07/23/22 0035 07/26/22 0249 07/26/22 2243 07/27/22 0304  WBC 7.0 7.6 8.9 9.4   Microbiology Recent Results (from the past 240 hour(s))  MRSA Next Gen by PCR, Nasal     Status: None   Collection Time: 07/24/22  3:49 AM   Specimen: Nasal Mucosa; Nasal Swab  Result Value Ref Range Status   MRSA by PCR Next Gen NOT DETECTED NOT DETECTED Final    Comment: (NOTE) The GeneXpert MRSA Assay (FDA approved for NASAL specimens  only), is one component of a comprehensive MRSA colonization surveillance program. It is not intended to diagnose MRSA infection nor to guide or monitor treatment for MRSA infections. Test performance is not FDA approved in patients less than 52 years old. Performed at [facility], [place] [address], [place], [place] [address]      Time coordinating discharge: 40 minutes  SIGNED:   Barb Merino, MD  [facility] 07/29/2022, 11:12 AM

## 2022-07-29 NOTE — Progress Notes (Signed)
Pt refused CPAP

## 2022-07-30 ENCOUNTER — Encounter (HOSPITAL_COMMUNITY): Payer: Self-pay | Admitting: Gastroenterology

## 2022-08-02 LAB — SURGICAL PATHOLOGY

## 2022-08-04 DIAGNOSIS — Z09 Encounter for follow-up examination after completed treatment for conditions other than malignant neoplasm: Secondary | ICD-10-CM | POA: Diagnosis not present

## 2022-08-04 DIAGNOSIS — D649 Anemia, unspecified: Secondary | ICD-10-CM | POA: Diagnosis not present

## 2022-08-04 DIAGNOSIS — I700 Atherosclerosis of aorta: Secondary | ICD-10-CM | POA: Diagnosis not present

## 2022-08-04 DIAGNOSIS — I10 Essential (primary) hypertension: Secondary | ICD-10-CM | POA: Diagnosis not present

## 2022-08-14 DIAGNOSIS — D509 Iron deficiency anemia, unspecified: Secondary | ICD-10-CM | POA: Diagnosis not present

## 2022-08-15 DIAGNOSIS — K922 Gastrointestinal hemorrhage, unspecified: Secondary | ICD-10-CM | POA: Diagnosis not present

## 2022-08-15 DIAGNOSIS — D509 Iron deficiency anemia, unspecified: Secondary | ICD-10-CM | POA: Diagnosis not present

## 2022-08-15 DIAGNOSIS — D500 Iron deficiency anemia secondary to blood loss (chronic): Secondary | ICD-10-CM | POA: Diagnosis not present

## 2022-09-04 DIAGNOSIS — R350 Frequency of micturition: Secondary | ICD-10-CM | POA: Diagnosis not present

## 2022-09-04 DIAGNOSIS — R0602 Shortness of breath: Secondary | ICD-10-CM | POA: Diagnosis not present

## 2022-09-04 DIAGNOSIS — Z23 Encounter for immunization: Secondary | ICD-10-CM | POA: Diagnosis not present

## 2022-09-04 DIAGNOSIS — I10 Essential (primary) hypertension: Secondary | ICD-10-CM | POA: Diagnosis not present

## 2022-09-04 DIAGNOSIS — D649 Anemia, unspecified: Secondary | ICD-10-CM | POA: Diagnosis not present

## 2022-09-04 DIAGNOSIS — M25512 Pain in left shoulder: Secondary | ICD-10-CM | POA: Diagnosis not present

## 2022-09-04 DIAGNOSIS — N3281 Overactive bladder: Secondary | ICD-10-CM | POA: Diagnosis not present

## 2022-09-13 ENCOUNTER — Ambulatory Visit (INDEPENDENT_AMBULATORY_CARE_PROVIDER_SITE_OTHER): Payer: Medicare Other

## 2022-09-13 ENCOUNTER — Encounter: Payer: Self-pay | Admitting: Orthopaedic Surgery

## 2022-09-13 ENCOUNTER — Ambulatory Visit (INDEPENDENT_AMBULATORY_CARE_PROVIDER_SITE_OTHER): Payer: Medicare Other | Admitting: Orthopaedic Surgery

## 2022-09-13 VITALS — BP 162/69 | HR 61 | Ht 59.0 in | Wt 114.0 lb

## 2022-09-13 DIAGNOSIS — M542 Cervicalgia: Secondary | ICD-10-CM

## 2022-09-13 DIAGNOSIS — M25512 Pain in left shoulder: Secondary | ICD-10-CM

## 2022-09-13 DIAGNOSIS — M25532 Pain in left wrist: Secondary | ICD-10-CM

## 2022-09-13 MED ORDER — BUPIVACAINE HCL 0.25 % IJ SOLN
4.0000 mL | INTRAMUSCULAR | Status: AC | PRN
  Administered 2022-09-13: 4 mL via INTRA_ARTICULAR

## 2022-09-13 MED ORDER — METHYLPREDNISOLONE ACETATE 40 MG/ML IJ SUSP
40.0000 mg | INTRAMUSCULAR | Status: AC | PRN
  Administered 2022-09-13: 40 mg via INTRA_ARTICULAR

## 2022-09-13 MED ORDER — LIDOCAINE HCL 1 % IJ SOLN
0.5000 mL | INTRAMUSCULAR | Status: AC | PRN
  Administered 2022-09-13: .5 mL

## 2022-09-13 NOTE — Progress Notes (Signed)
Office Visit Note   Patient: Stephanie Barber           Date of Birth: [birth year]           MRN: [id number] Visit Date: 09/13/2022              Requested by: [name], MD [address],  [place] [id number] PCP: [name], MD   Assessment & Plan: Visit Diagnoses:  1. Pain in left wrist   2. Neck pain   3. Acute pain of left shoulder     Plan: We will proceed with subacromial injection and recheck her in 4 weeks.  She may require cervical MRI imaging.  We discussed some portion of the shoulder injection may give her some neck improvement.  Patient has stage III kidney disease and has some borderline elevated glucose levels less than 120.  Follow-Up Instructions: No follow-ups on file.   Orders:  Orders Placed This Encounter  Procedures   XR Cervical Spine 2 or 3 views   XR Shoulder Left   XR Wrist Complete Left   No orders of the defined types were placed in this encounter.     Procedures: Large Joint Inj: L subacromial bursa on 09/13/2022 2:49 PM Indications: pain Details: 22 G 1.5 in needle  Arthrogram: No  Medications: 4 mL bupivacaine 0.25 %; 40 mg methylPREDNISolone acetate 40 MG/ML; 0.5 mL lidocaine 1 % Outcome: tolerated well, no immediate complications Procedure, treatment alternatives, risks and benefits explained, specific risks discussed. Consent was given by the patient. Immediately prior to procedure a time out was called to verify the correct patient, procedure, equipment, support staff and site/side marked as required. Patient was prepped and draped in the usual sterile fashion.       Clinical Data: No additional findings.   Subjective: Chief Complaint  Patient presents with   Neck - Pain   Left Shoulder - Pain   Left Wrist - Pain    HPI 81 year old female with several months of progressive neck pain difficulty turning right and left with sharp pain in the left side of her neck radiates into her shoulder.  She has  pain with outstretched reaching overhead activities left side only also pain at the base of her thumb bothers her with gripping and squeezing. She denies myelopathic problems. Review of Systems all systems noncontributory.   Objective: Vital Signs: BP (!) 162/69   Pulse 61   Ht '4\' 11"'$  (1.499 m)   Wt 114 lb (51.7 kg)   BMI 23.03 kg/m   Physical Exam Constitutional:      Appearance: She is well-developed.  HENT:     Head: Normocephalic.     Right Ear: External ear normal.     Left Ear: External ear normal. There is no impacted cerumen.  Eyes:     Pupils: Pupils are equal, round, and reactive to light.  Neck:     Thyroid: No thyromegaly.     Trachea: No tracheal deviation.  Cardiovascular:     Rate and Rhythm: Normal rate.  Pulmonary:     Effort: Pulmonary effort is normal.  Abdominal:     Palpations: Abdomen is soft.  Musculoskeletal:     Cervical back: No rigidity.  Skin:    General: Skin is warm and dry.  Neurological:     Mental Status: She is alert and oriented to person, place, and time.  Psychiatric:        Behavior: Behavior normal.  Ortho Exam negative Finkelstein test positive CMC tenderness left only.  Positive CMC grind test.  Dorsal compartments otherwise normal sensation C6 distribution normal.  Extreme brachial plexus tenderness positive Spurling on the left negative on the right.  Positive impingement left shoulder.  Pain with drop arm test but she does have some resistance.  Negative liftoff test.  Specialty Comments:  No specialty comments available.  Imaging: No results found.   PMFS History: Patient Active Problem List   Diagnosis Date Noted   GI bleeding 07/23/2022   Stage 3b chronic kidney disease (CKD) ([place])    Other spondylosis with radiculopathy, lumbar region 09/19/2021   IDA (iron deficiency anemia) 07/06/2021   Protein-calorie malnutrition, severe 06/21/2021   ABLA (acute blood loss anemia)    Malnutrition of moderate degree  06/16/2021   Hematochezia 06/15/2021   Acute GI bleeding 06/15/2021   Hyperlipidemia associated with type 2 diabetes mellitus ([place]) 06/15/2021   Left-sided headache 06/30/2019   Memory loss 06/30/2019   AKI (acute kidney injury) ([place]) 05/06/2017   Dehydration 05/06/2017   Chest pain 05/06/2017   Wound infection after surgery 01/30/2017   Palpitations 08/24/2016   Noncompliance with CPAP treatment 12/28/2015   Poor compliance with CPAP treatment 10/26/2014   CPAP use counseling 07/28/2014   OSA on CPAP 07/28/2014   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   Hypersomnolent 04/02/2014   Obstructive sleep apnea 04/02/2014   Non-compliance with treatment 03/05/2014   Type 2 diabetes mellitus ([place]) 12/05/2011   Hypertension 12/05/2011   GERD (gastroesophageal reflux disease)    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   Chronic cough 07/26/2011   Past Medical History:  Diagnosis Date   Arthritis    Chronic headache    Complication of anesthesia    allergy to Novocaine    Diabetes mellitus    GERD (gastroesophageal reflux disease)    GI bleed    Heart murmur    High cholesterol    Hyperlipidemia    Hypertension    Intracranial atherosclerosis    per MRA   Migraine    Narcolepsy and cataplexy    OSA on CPAP    states has not used CPAP in over a year   Rectal prolapse    Stage 3b chronic kidney disease (CKD) ([place])    Trigger finger of all digits of right hand     Family History  Problem Relation Age of Onset   Heart disease Mother    Throat cancer Father    Cancer Father        stomach   Kidney cancer Brother    Heart disease Brother     Past Surgical History:  Procedure Laterality Date   ABDOMINAL HYSTERECTOMY     Fibroids   BACK SURGERY     L4,L5 discectomy   CARDIAC CATHETERIZATION     CARPAL TUNNEL RELEASE Right 01/18/2017   COLONOSCOPY N/A 07/28/2022   Procedure: COLONOSCOPY;  Surgeon: Clarene Essex, MD;  Location: [facility];  Service: Gastroenterology;   Laterality: N/A;   COLONOSCOPY WITH PROPOFOL N/A 06/17/2021   Procedure: COLONOSCOPY WITH PROPOFOL;  Surgeon: Wilford Corner, MD;  Location: [facility];  Service: Endoscopy;  Laterality: N/A;   ESOPHAGOGASTRODUODENOSCOPY (EGD) WITH PROPOFOL N/A 06/17/2021   Procedure: ESOPHAGOGASTRODUODENOSCOPY (EGD) WITH PROPOFOL;  Surgeon: Wilford Corner, MD;  Location: [facility];  Service: Endoscopy;  Laterality: N/A;   KIDNEY SURGERY  1998   Right.  growth removed   POLYPECTOMY  06/17/2021   Procedure: POLYPECTOMY;  Surgeon: Wilford Corner, MD;  Location: [facility];  Service: Endoscopy;;   POLYPECTOMY  07/28/2022   Procedure: POLYPECTOMY;  Surgeon: Clarene Essex, MD;  Location: [facility];  Service: Gastroenterology;;   rectal prolapse repair  12/05/2011   ROTATOR CUFF REPAIR     Left   SHOULDER ARTHROSCOPY WITH SUBACROMIAL DECOMPRESSION, ROTATOR CUFF REPAIR AND BICEP TENDON REPAIR Left 05/25/2015   Procedure: SHOULDER ARTHROSCOPY WITH SUBACROMIAL DECOMPRESSION, ROTATOR CUFF REPAIR AND BICEP TENODESIS, DEBRIDEMENT. ;  Surgeon: Meredith Pel, MD;  Location: [facility];  Service: Orthopedics;  Laterality: Left;  LEFT SHOULDER ROTATOR CUFF TEAR REPAIR, ARTHROSCOPY, DEBRIDEMENT, BICEPS TENODESIS, SUBACROMIAL DECOMPRESSION.   TOTAL ABDOMINAL HYSTERECTOMY     TRIGGER FINGER RELEASE Right 04/30/2018   Procedure: RELEASE TRIGGER FINGER/A-1 PULLEY RIGHT MIDDLE  AND SMALLn ;  Surgeon: Daryll Brod, MD;  Location: [facility];  Service: Orthopedics;  Laterality: Right;  FAB, Bier block   TUBAL LIGATION     Social History   Occupational History   Occupation: retired. prev worked at [employer].  Tobacco Use   Smoking status: Former    Types: Cigarettes    Quit date: 11/13/1980    Years since quitting: 41.8   Smokeless tobacco: Never   Tobacco comments:    quit in 1982  Vaping Use   Vaping Use: Never used  Substance and Sexual Activity   Alcohol use: No    Alcohol/week:  0.0 standard drinks of alcohol   Drug use: No   Sexual activity: Yes

## 2022-09-20 ENCOUNTER — Inpatient Hospital Stay (HOSPITAL_COMMUNITY)
Admission: EM | Admit: 2022-09-20 | Discharge: 2022-09-23 | DRG: 378 | Disposition: A | Discharge status: Home / Self Care | Payer: Medicare Other | Attending: Internal Medicine | Admitting: Internal Medicine

## 2022-09-20 ENCOUNTER — Encounter (HOSPITAL_COMMUNITY): Payer: Self-pay | Admitting: Internal Medicine

## 2022-09-20 ENCOUNTER — Other Ambulatory Visit: Payer: Self-pay

## 2022-09-20 DIAGNOSIS — K922 Gastrointestinal hemorrhage, unspecified: Secondary | ICD-10-CM | POA: Diagnosis not present

## 2022-09-20 DIAGNOSIS — K8689 Other specified diseases of pancreas: Secondary | ICD-10-CM | POA: Diagnosis not present

## 2022-09-20 DIAGNOSIS — Z79899 Other long term (current) drug therapy: Secondary | ICD-10-CM

## 2022-09-20 DIAGNOSIS — E7800 Pure hypercholesterolemia, unspecified: Secondary | ICD-10-CM | POA: Diagnosis not present

## 2022-09-20 DIAGNOSIS — E119 Type 2 diabetes mellitus without complications: Secondary | ICD-10-CM

## 2022-09-20 DIAGNOSIS — G4733 Obstructive sleep apnea (adult) (pediatric): Secondary | ICD-10-CM | POA: Diagnosis not present

## 2022-09-20 DIAGNOSIS — Z6823 Body mass index (BMI) 23.0-23.9, adult: Secondary | ICD-10-CM | POA: Diagnosis not present

## 2022-09-20 DIAGNOSIS — E739 Lactose intolerance, unspecified: Secondary | ICD-10-CM | POA: Diagnosis present

## 2022-09-20 DIAGNOSIS — Z90710 Acquired absence of both cervix and uterus: Secondary | ICD-10-CM

## 2022-09-20 DIAGNOSIS — Z9049 Acquired absence of other specified parts of digestive tract: Secondary | ICD-10-CM

## 2022-09-20 DIAGNOSIS — D649 Anemia, unspecified: Secondary | ICD-10-CM | POA: Diagnosis not present

## 2022-09-20 DIAGNOSIS — E440 Moderate protein-calorie malnutrition: Secondary | ICD-10-CM | POA: Diagnosis present

## 2022-09-20 DIAGNOSIS — Z87891 Personal history of nicotine dependence: Secondary | ICD-10-CM | POA: Diagnosis not present

## 2022-09-20 DIAGNOSIS — Z8051 Family history of malignant neoplasm of kidney: Secondary | ICD-10-CM

## 2022-09-20 DIAGNOSIS — Z8249 Family history of ischemic heart disease and other diseases of the circulatory system: Secondary | ICD-10-CM

## 2022-09-20 DIAGNOSIS — N1832 Chronic kidney disease, stage 3b: Secondary | ICD-10-CM | POA: Diagnosis present

## 2022-09-20 DIAGNOSIS — I129 Hypertensive chronic kidney disease with stage 1 through stage 4 chronic kidney disease, or unspecified chronic kidney disease: Secondary | ICD-10-CM | POA: Diagnosis not present

## 2022-09-20 DIAGNOSIS — K5750 Diverticulosis of both small and large intestine without perforation or abscess without bleeding: Secondary | ICD-10-CM | POA: Diagnosis not present

## 2022-09-20 DIAGNOSIS — I10 Essential (primary) hypertension: Secondary | ICD-10-CM | POA: Diagnosis present

## 2022-09-20 DIAGNOSIS — E1122 Type 2 diabetes mellitus with diabetic chronic kidney disease: Secondary | ICD-10-CM | POA: Diagnosis not present

## 2022-09-20 DIAGNOSIS — K219 Gastro-esophageal reflux disease without esophagitis: Secondary | ICD-10-CM | POA: Diagnosis not present

## 2022-09-20 DIAGNOSIS — Z808 Family history of malignant neoplasm of other organs or systems: Secondary | ICD-10-CM | POA: Diagnosis not present

## 2022-09-20 DIAGNOSIS — K5731 Diverticulosis of large intestine without perforation or abscess with bleeding: Secondary | ICD-10-CM | POA: Diagnosis not present

## 2022-09-20 DIAGNOSIS — K625 Hemorrhage of anus and rectum: Secondary | ICD-10-CM | POA: Diagnosis present

## 2022-09-20 LAB — CBC WITH DIFFERENTIAL/PLATELET
Abs Immature Granulocytes: 0.02 10*3/uL (ref 0.00–0.07)
Basophils Absolute: 0 10*3/uL (ref 0.0–0.1)
Basophils Relative: 0 %
Eosinophils Absolute: 0.3 10*3/uL (ref 0.0–0.5)
Eosinophils Relative: 4 %
HCT: 37 % (ref 36.0–46.0)
Hemoglobin: 12 g/dL (ref 12.0–15.0)
Immature Granulocytes: 0 %
Lymphocytes Relative: 19 %
Lymphs Abs: 1.5 10*3/uL (ref 0.7–4.0)
MCH: 30.4 pg (ref 26.0–34.0)
MCHC: 32.4 g/dL (ref 30.0–36.0)
MCV: 93.7 fL (ref 80.0–100.0)
Monocytes Absolute: 0.5 10*3/uL (ref 0.1–1.0)
Monocytes Relative: 6 %
Neutro Abs: 5.6 10*3/uL (ref 1.7–7.7)
Neutrophils Relative %: 71 %
Platelets: 281 10*3/uL (ref 150–400)
RBC: 3.95 MIL/uL (ref 3.87–5.11)
RDW: 13.3 % (ref 11.5–15.5)
WBC: 7.9 10*3/uL (ref 4.0–10.5)
nRBC: 0 % (ref 0.0–0.2)

## 2022-09-20 LAB — HEMOGLOBIN AND HEMATOCRIT, BLOOD
HCT: 35.4 % — ABNORMAL LOW (ref 36.0–46.0)
HCT: 33.5 % — ABNORMAL LOW (ref 36.0–46.0)
Hemoglobin: 11.5 g/dL — ABNORMAL LOW (ref 12.0–15.0)
Hemoglobin: 10.9 g/dL — ABNORMAL LOW (ref 12.0–15.0)

## 2022-09-20 LAB — URINALYSIS, ROUTINE W REFLEX MICROSCOPIC
Bilirubin Urine: NEGATIVE
Glucose, UA: NEGATIVE mg/dL
Hgb urine dipstick: NEGATIVE
Ketones, ur: NEGATIVE mg/dL
Leukocytes,Ua: NEGATIVE
Nitrite: NEGATIVE
Protein, ur: NEGATIVE mg/dL
Specific Gravity, Urine: 1.014 (ref 1.005–1.030)
pH: 5 (ref 5.0–8.0)

## 2022-09-20 LAB — COMPREHENSIVE METABOLIC PANEL
ALT: 13 U/L (ref 0–44)
AST: 17 U/L (ref 15–41)
Albumin: 3.8 g/dL (ref 3.5–5.0)
Alkaline Phosphatase: 61 U/L (ref 38–126)
Anion gap: 7 (ref 5–15)
BUN: 20 mg/dL (ref 8–23)
CO2: 20 mmol/L — ABNORMAL LOW (ref 22–32)
Calcium: 9.2 mg/dL (ref 8.9–10.3)
Chloride: 117 mmol/L — ABNORMAL HIGH (ref 98–111)
Creatinine, Ser: 1.26 mg/dL — ABNORMAL HIGH (ref 0.44–1.00)
GFR, Estimated: 43 mL/min — ABNORMAL LOW (ref >60)
Glucose, Bld: 83 mg/dL (ref 70–99)
Potassium: 4.7 mmol/L (ref 3.5–5.1)
Sodium: 144 mmol/L (ref 135–145)
Total Bilirubin: 0.5 mg/dL (ref 0.3–1.2)
Total Protein: 7.1 g/dL (ref 6.5–8.1)

## 2022-09-20 LAB — PROTIME-INR
INR: 1.1 (ref 0.8–1.2)
Prothrombin Time: 14.1 seconds (ref 11.4–15.2)

## 2022-09-20 LAB — TYPE AND SCREEN
ABO/RH(D): O POS
Antibody Screen: NEGATIVE

## 2022-09-20 MED ORDER — AMLODIPINE BESYLATE 10 MG PO TABS
10.0000 mg | ORAL_TABLET | Freq: Every day | ORAL | Status: DC
  Administered 2022-09-20 – 2022-09-23 (×4): 10 mg via ORAL
  Filled 2022-09-20 (×4): qty 1

## 2022-09-20 MED ORDER — PANTOPRAZOLE SODIUM 40 MG PO TBEC
40.0000 mg | DELAYED_RELEASE_TABLET | Freq: Every day | ORAL | Status: DC
  Administered 2022-09-20 – 2022-09-23 (×4): 40 mg via ORAL
  Filled 2022-09-20 (×4): qty 1

## 2022-09-20 MED ORDER — METOPROLOL SUCCINATE ER 50 MG PO TB24
50.0000 mg | ORAL_TABLET | Freq: Every day | ORAL | Status: DC
  Administered 2022-09-20 – 2022-09-23 (×4): 50 mg via ORAL
  Filled 2022-09-20 (×4): qty 1

## 2022-09-20 MED ORDER — SODIUM CHLORIDE 0.9 % IV SOLN: INTRAVENOUS | Status: DC

## 2022-09-20 MED ORDER — PANCRELIPASE (LIP-PROT-AMYL) 36000-114000 UNITS PO CPEP
36000.0000 [IU] | ORAL_CAPSULE | Freq: Two times a day (BID) | ORAL | Status: DC
  Administered 2022-09-21 – 2022-09-23 (×5): 36000 [IU] via ORAL
  Filled 2022-09-20 (×6): qty 1

## 2022-09-20 MED ORDER — SODIUM CHLORIDE 0.9 % IV BOLUS
500.0000 mL | Freq: Once | INTRAVENOUS | Status: AC
  Administered 2022-09-20: 500 mL via INTRAVENOUS

## 2022-09-20 NOTE — ED Provider Notes (Signed)
[facility] DEPT Provider Note   CSN: [id number] Arrival date & time: 09/20/22  7209     History  Chief Complaint  Patient presents with   Rectal Bleeding    Stephanie Barber is a 81 y.o. female.   Rectal Bleeding Patient presents with rectal bleeding.  Recent admission about 2 months ago for the same.  Required 5 units at times.  Thought to be potentially diverticular.  Bleeding scan did not show clear cause.  I reviewed discharge note.  However had an episode on Monday with bright red blood rectally again.  And again today on Wednesday she has had 2 more episodes.  States she is feel lightheaded for a while now but worse the last couple days.  No chest pain.  No trouble breathing.  Not on blood thinners.    Past Medical History:  Diagnosis Date   Arthritis    Chronic headache    Complication of anesthesia    allergy to Novocaine    Diabetes mellitus    GERD (gastroesophageal reflux disease)    GI bleed    Heart murmur    High cholesterol    Hyperlipidemia    Hypertension    Intracranial atherosclerosis    per MRA   Migraine    Narcolepsy and cataplexy    OSA on CPAP    states has not used CPAP in over a year   Rectal prolapse    Stage 3b chronic kidney disease (CKD) (HCC)    Trigger finger of all digits of right hand     Home Medications Prior to Admission medications   Medication Sig Start Date End Date Taking? Authorizing Provider  acetaminophen (TYLENOL) 500 MG tablet Take 500 mg by mouth every evening.    [provider]  acetaminophen (TYLENOL) 650 MG CR tablet Take 650 mg by mouth in the morning.    [provider]  amLODipine (NORVASC) 10 MG tablet Take 10 mg by mouth in the morning.    [provider]  CINNAMON PO Take 1 capsule by mouth daily.    [provider]  Digestive Enzymes CAPS Take 1 capsule by mouth 2 (two) times daily.    [provider]  donepezil (ARICEPT) 10 MG  tablet Take 10 mg by mouth at bedtime. 03/22/22   [provider]  Ferrous Sulfate (IRON) 325 (65 Fe) MG TABS Take 1 tablet (325 mg total) by mouth daily. Patient taking differently: Take 325 mg by mouth in the morning. 06/23/21   [name], DO  lipase/protease/amylase (CREON) 36000 UNITS CPEP capsule Take 36,000-72,000 Units by mouth See admin instructions. 2 capsules (72000 units) 3 times daily with meals, 1 capsule (36000 units) with snacks    [provider]  loperamide (IMODIUM) 2 MG capsule Take 2 mg by mouth 4 (four) times daily as needed for diarrhea or loose stools.    [provider]  metoprolol succinate (TOPROL-XL) 50 MG 24 hr tablet Take 50 mg by mouth in the morning.    [provider]  OVER THE COUNTER MEDICATION Take 1-2 capsules by mouth at bedtime. Relaxium Sleep supplement    [provider]  pantoprazole (PROTONIX) 40 MG tablet Take 1 tablet (40 mg total) by mouth daily. 07/29/22 07/29/23  Barb Merino, MD  Polyethyl Glycol-Propyl Glycol 0.4-0.3 % SOLN Place 1 drop into both eyes daily.    [provider]  pravastatin (PRAVACHOL) 40 MG tablet Take 40 mg by mouth  at bedtime.    [provider]      Allergies    Morphine and related and Lactose intolerance (gi)    Review of Systems   Review of Systems  Gastrointestinal:  Positive for hematochezia.    Physical Exam Updated Vital Signs BP (!) 108/94   Pulse 68   Temp 98.5 F (36.9 C) (Oral)   Resp 15   SpO2 100%  Physical Exam Vitals and nursing note reviewed.  Cardiovascular:     Rate and Rhythm: Regular rhythm.  Pulmonary:     Breath sounds: No wheezing.  Abdominal:     Tenderness: There is no abdominal tenderness.  Musculoskeletal:        General: No tenderness.     Cervical back: Neck supple.  Skin:    General: Skin is warm.     Capillary Refill: Capillary refill takes less than 2 seconds.  Neurological:     Mental Status: She is alert.      ED Results / Procedures / Treatments   Labs (all labs ordered are listed, but only abnormal results are displayed) Labs Reviewed  URINALYSIS, ROUTINE W REFLEX MICROSCOPIC - Abnormal; Notable for the following components:      Result Value   Color, Urine STRAW (*)    All other components within normal limits  COMPREHENSIVE METABOLIC PANEL - Abnormal; Notable for the following components:   Chloride 117 (*)    CO2 20 (*)    Creatinine, Ser 1.26 (*)    GFR, Estimated 43 (*)    All other components within normal limits  HEMOGLOBIN AND HEMATOCRIT, BLOOD - Abnormal; Notable for the following components:   Hemoglobin 11.5 (*)    HCT 35.4 (*)    All other components within normal limits  URINE CULTURE  CBC WITH DIFFERENTIAL/PLATELET  PROTIME-INR  TYPE AND SCREEN    EKG None  Radiology No results found.  Procedures Procedures    Medications Ordered in ED Medications  sodium chloride 0.9 % bolus 500 mL (0 mLs Intravenous Stopped 09/20/22 1158)    ED Course/ Medical Decision Making/ A&P                           Medical Decision Making Amount and/or Complexity of Data Reviewed Labs: ordered.  Risk Decision regarding hospitalization.   Patient with rectal bleeding.  History of same.  Unknown initial source with previous visit.  States feels lightheaded and dizzy.  Has had 2 episodes today and one episode on Monday.  Recent admission that required 5 units.  Initial hemoglobin reassuring.  Will check more basic blood work and kidney function.  Potentially will require admission to the hospital for monitoring.  There was delay in getting the CMP back.  About a 4-hour delta hemoglobin has come back at 11.5 down from 12.  Discussed with Dr. Michail Sermon from gastroenterology.  He and I feel that with the severe recent bleeding requiring 5 units of blood patient benefit from admission to the hospital for monitoring.  Will discuss with hospitalist. Patient did have another bowel  movement here that was not bloody.        Final Clinical Impression(s) / ED Diagnoses Final diagnoses:  Lower GI bleed    Rx / DC Orders ED Discharge Orders     None         Davonna Belling, MD 09/20/22 1418

## 2022-09-20 NOTE — ED Provider Triage Note (Signed)
Emergency Medicine Provider Triage Evaluation Note  CHASE KNEBEL , a 81 y.o. female  was evaluated in triage.  Pt complains of return of dark red GI bleeding with some associated weakness. No dysuria. Began having bleeding early this AM.   Review of Systems  Positive: GI bleeding Negative: Abdominal pain  Physical Exam  BP (!) 158/77   Pulse 61   Temp 98.2 F (36.8 C)   Resp 20   SpO2 100%  Gen:   Awake, no distress   Resp:  Normal effort  MSK:   Moves extremities without difficulty  Other:  No abdominal tenderness.   Medical Decision Making  Medically screening exam initiated at 7:49 AM.  Appropriate orders placed.  SHIRI HODAPP was informed that the remainder of the evaluation will be completed by another provider, this initial triage assessment does not replace that evaluation, and the importance of remaining in the ED until their evaluation is complete.    Margette Fast, MD 09/20/22 [phone]

## 2022-09-20 NOTE — Progress Notes (Signed)
Patient and family decline nocturnal CPAP. They also deny CPAP use at home or the need for one. Order changed to prn.

## 2022-09-20 NOTE — Plan of Care (Signed)
  Problem: Education: Goal: Knowledge of General Education information will improve Description: Including pain rating scale, medication(s)/side effects and non-pharmacologic comfort measures Outcome: Progressing   Problem: Activity: Goal: Risk for activity intolerance will decrease Outcome: Progressing   Problem: Nutrition: Goal: Adequate nutrition will be maintained Outcome: Progressing   

## 2022-09-20 NOTE — Consult Note (Signed)
Referring Provider: Dr. Alvino Chapel Primary Care Physician:  [name], MD Primary Gastroenterologist:  Dr. Michail Sermon  Reason for Consultation:  Rectal bleeding  HPI: Stephanie Barber is a 81 y.o. female with history of diverticular bleed last in September and had a colonoscopy that showed diffuse diverticulosis and normal appearing colocolonic anastomosis in the rectosigmoid. No blood seen during colonoscopy. Previous sigmoid colectomy reportedly due to rectal prolapse. Had acute onset of red blood with clots this past Monday (one time). No bleeding Tuesday but had profuse blood per rectum today that happened twice. Denies abdominal pain/N/V/dizziness.   Past Medical History:  Diagnosis Date   Arthritis    Chronic headache    Complication of anesthesia    allergy to Novocaine    Diabetes mellitus    GERD (gastroesophageal reflux disease)    GI bleed    Heart murmur    High cholesterol    Hyperlipidemia    Hypertension    Intracranial atherosclerosis    per MRA   Migraine    Narcolepsy and cataplexy    OSA on CPAP    states has not used CPAP in over a year   Rectal prolapse    Stage 3b chronic kidney disease (CKD) (HCC)    Trigger finger of all digits of right hand     Past Surgical History:  Procedure Laterality Date   ABDOMINAL HYSTERECTOMY     Fibroids   BACK SURGERY     L4,L5 discectomy   CARDIAC CATHETERIZATION     CARPAL TUNNEL RELEASE Right 01/18/2017   COLONOSCOPY N/A 07/28/2022   Procedure: COLONOSCOPY;  Surgeon: Clarene Essex, MD;  Location: [facility];  Service: Gastroenterology;  Laterality: N/A;   COLONOSCOPY WITH PROPOFOL N/A 06/17/2021   Procedure: COLONOSCOPY WITH PROPOFOL;  Surgeon: Wilford Corner, MD;  Location: [facility];  Service: Endoscopy;  Laterality: N/A;   ESOPHAGOGASTRODUODENOSCOPY (EGD) WITH PROPOFOL N/A 06/17/2021   Procedure: ESOPHAGOGASTRODUODENOSCOPY (EGD) WITH PROPOFOL;  Surgeon: Wilford Corner, MD;  Location: [facility];   Service: Endoscopy;  Laterality: N/A;   KIDNEY SURGERY  1998   Right.  growth removed   POLYPECTOMY  06/17/2021   Procedure: POLYPECTOMY;  Surgeon: Wilford Corner, MD;  Location: [facility];  Service: Endoscopy;;   POLYPECTOMY  07/28/2022   Procedure: POLYPECTOMY;  Surgeon: Clarene Essex, MD;  Location: [facility];  Service: Gastroenterology;;   rectal prolapse repair  12/05/2011   ROTATOR CUFF REPAIR     Left   SHOULDER ARTHROSCOPY WITH SUBACROMIAL DECOMPRESSION, ROTATOR CUFF REPAIR AND BICEP TENDON REPAIR Left 05/25/2015   Procedure: SHOULDER ARTHROSCOPY WITH SUBACROMIAL DECOMPRESSION, ROTATOR CUFF REPAIR AND BICEP TENODESIS, DEBRIDEMENT. ;  Surgeon: Meredith Pel, MD;  Location: [facility];  Service: Orthopedics;  Laterality: Left;  LEFT SHOULDER ROTATOR CUFF TEAR REPAIR, ARTHROSCOPY, DEBRIDEMENT, BICEPS TENODESIS, SUBACROMIAL DECOMPRESSION.   TOTAL ABDOMINAL HYSTERECTOMY     TRIGGER FINGER RELEASE Right 04/30/2018   Procedure: RELEASE TRIGGER FINGER/A-1 PULLEY RIGHT MIDDLE  AND SMALLn ;  Surgeon: Daryll Brod, MD;  Location: [facility];  Service: Orthopedics;  Laterality: Right;  FAB, Bier block   TUBAL LIGATION      Prior to Admission medications   Medication Sig Start Date End Date Taking? Authorizing Provider  acetaminophen (TYLENOL) 500 MG tablet Take 500 mg by mouth every evening.    [provider]  acetaminophen (TYLENOL) 650 MG CR tablet Take 650 mg by mouth in the morning.    [provider]  amLODipine (NORVASC) 10 MG tablet  Take 10 mg by mouth in the morning.    [provider]  CINNAMON PO Take 1 capsule by mouth daily.    [provider]  Digestive Enzymes CAPS Take 1 capsule by mouth 2 (two) times daily.    [provider]  donepezil (ARICEPT) 10 MG tablet Take 10 mg by mouth at bedtime. 03/22/22   [provider]  Ferrous Sulfate (IRON) 325 (65 Fe) MG TABS Take 1 tablet (325 mg total) by mouth  daily. Patient taking differently: Take 325 mg by mouth in the morning. 06/23/21   [name], DO  lipase/protease/amylase (CREON) 36000 UNITS CPEP capsule Take 36,000-72,000 Units by mouth See admin instructions. 2 capsules (72000 units) 3 times daily with meals, 1 capsule (36000 units) with snacks    [provider]  loperamide (IMODIUM) 2 MG capsule Take 2 mg by mouth 4 (four) times daily as needed for diarrhea or loose stools.    [provider]  metoprolol succinate (TOPROL-XL) 50 MG 24 hr tablet Take 50 mg by mouth in the morning.    [provider]  OVER THE COUNTER MEDICATION Take 1-2 capsules by mouth at bedtime. Relaxium Sleep supplement    [provider]  pantoprazole (PROTONIX) 40 MG tablet Take 1 tablet (40 mg total) by mouth daily. 07/29/22 07/29/23  Barb Merino, MD  Polyethyl Glycol-Propyl Glycol 0.4-0.3 % SOLN Place 1 drop into both eyes daily.    [provider]  pravastatin (PRAVACHOL) 40 MG tablet Take 40 mg by mouth at bedtime.    [provider]    Scheduled Meds: Continuous Infusions: PRN Meds:.  Allergies as of 09/20/2022 - Review Complete 09/20/2022  Allergen Reaction Noted   Morphine and related Anaphylaxis 07/25/2011   Lactose intolerance (gi) Diarrhea and Nausea Only     Family History  Problem Relation Age of Onset   Heart disease Mother    Throat cancer Father    Cancer Father        stomach   Kidney cancer Brother    Heart disease Brother     Social History   Socioeconomic History   Marital status: Married    Spouse name: [name]   Number of children: 4   Years of education: 14   Highest education level: Not on file  Occupational History   Occupation: retired. prev worked at [employer].  Tobacco Use   Smoking status: Former    Types: Cigarettes    Quit date: 11/13/1980    Years since quitting: 41.8   Smokeless tobacco: Never   Tobacco comments:    quit in 1982  Vaping Use    Vaping Use: Never used  Substance and Sexual Activity   Alcohol use: No    Alcohol/week: 0.0 standard drinks of alcohol   Drug use: No   Sexual activity: Yes  Other Topics Concern   Not on file  Social History Narrative   21 st January 2014 , patient underwent PS and MSLT - MSLT had one  SREMs and an average time to fall asleep of  *.8 minutes , her Ewort is 20 points,: facit: this patient has severe hypersomnia and is at risk when driving. medication in form ogf nuvigil smaples had been dispensed to her but she ha not yet taken it. 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 In detail -discussion of diagnosis and treatment  takes place today , first with nuvigil and if insufficient, with XYREM. Her CPAP treats her OSA very well, and she uses it 6 hours  or more each night,  residual AHI of 1.1  would not allow for OSA to be still explaining this degree of sleepiness. 2 downloads were reviewed. labs reviewed.    Patient is married [name]) and lives at home with her husband and grandchild.   Patient has four children   Patient is retired.   Patient has a college education.   Patient is right-handed.   Patient does not drink any caffeine.      Social Determinants of Health   Financial Resource Strain: Not on file  Food Insecurity: No Food Insecurity (07/29/2022)   Hunger Vital Sign    Worried About Running Out of Food in the Last Year: Never true    Ran Out of Food in the Last Year: Never true  Transportation Needs: No Transportation Needs (07/29/2022)   PRAPARE - Hydrologist (Medical): No    Lack of Transportation (Non-Medical): No  Physical Activity: Not on file  Stress: Not on file  Social Connections: Not on file  Intimate Partner Violence: Not At Risk (07/29/2022)   Humiliation, Afraid, Rape, and Kick  questionnaire    Fear of Current or Ex-Partner: No    Emotionally Abused: No    Physically Abused: No    Sexually Abused: No    Review of Systems: All negative except as stated above in HPI.  Physical Exam: Vital signs: Vitals:   09/20/22 1345 09/20/22 1400  BP: 137/67 (!) 108/94  Pulse: 71 68  Resp: 18 15  Temp:    SpO2: 100% 100%  T 98.5   General:   Lethargic, thin, elderly, no acute distress, pleasant Head: normocephalic, atraumatic Eyes: anicteric sclera ENT: oropharynx clear Neck: supple, nontender Lungs:  Clear throughout to auscultation.   No wheezes, crackles, or rhonchi. No acute distress. Heart:  Regular rate and rhythm; no murmurs, clicks, rubs,  or gallops. Abdomen: diffusely tender with guarding, soft, nondistended, +BS  Rectal:  Deferred Ext: no edema  GI:  Lab Results: Recent Labs    09/20/22 0815 09/20/22 1209  WBC 7.9  --   HGB 12.0 11.5*  HCT 37.0 35.4*  PLT 281  --    BMET Recent Labs    09/20/22 1327  NA 144  K 4.7  CL 117*  CO2 20*  GLUCOSE 83  BUN 20  CREATININE 1.26*  CALCIUM 9.2   LFT Recent Labs    09/20/22 1327  PROT 7.1  ALBUMIN 3.8  AST 17  ALT 13  ALKPHOS 61  BILITOT 0.5   PT/INR Recent Labs    09/20/22 0815  LABPROT 14.1  INR 1.1     Studies/Results: No results found.  Impression/Plan: Lower GI bleed likely diverticular. Hgb 11.5 (8.7 in 07/29/22). Manage conservatively. If bleeding persists then needs a CT angiogram with embolization by IR if positive. I do not think a repeat colonoscopy is needed. Clear liquid diet. Supportive care. Will f/u.    LOS: 0 days   [name]  09/20/2022, 2:40 PM  Questions please call [phone]

## 2022-09-20 NOTE — H&P (Signed)
History and Physical    Patient: Stephanie Barber TDV:[id number] DOB: 04-04-1941 DOA: 09/20/2022 DOS: the patient was seen and examined on 09/20/2022 PCP: [name], MD  Patient coming from: Home  Chief Complaint:  Chief Complaint  Patient presents with   Rectal Bleeding   HPI: Stephanie Barber is a 81 y.o. female with medical history significant of HTN, chronic pancreatic insuffiencey, OSA, DVT. Presenting with rectal bleeding. She was in her normal state of health until 2 days ago. At that time, she had two episodes of dark stools w/ some BRBPR as well. She didn't have any abdominal pain. She did not have have any N/V or fevers. She did not have any lightheadedness or dizziness. She reports normal stools yesterday. However, this morning her dark stools returned. She decided at that point to come to the ED for evaluation. She reports that she had an admission a couple of months ago for the same. During that admission, she required 5 units of blood. She denies any other aggravating or alleviating factors.   Review of Systems: As mentioned in the history of present illness. All other systems reviewed and are negative. Past Medical History:  Diagnosis Date   Arthritis    Chronic headache    Complication of anesthesia    allergy to Novocaine    Diabetes mellitus    GERD (gastroesophageal reflux disease)    GI bleed    Heart murmur    High cholesterol    Hyperlipidemia    Hypertension    Intracranial atherosclerosis    per MRA   Migraine    Narcolepsy and cataplexy    OSA on CPAP    states has not used CPAP in over a year   Rectal prolapse    Stage 3b chronic kidney disease (CKD) (HCC)    Trigger finger of all digits of right hand    Past Surgical History:  Procedure Laterality Date   ABDOMINAL HYSTERECTOMY     Fibroids   BACK SURGERY     L4,L5 discectomy   CARDIAC CATHETERIZATION     CARPAL TUNNEL RELEASE Right 01/18/2017   COLONOSCOPY N/A 07/28/2022   Procedure:  COLONOSCOPY;  Surgeon: Clarene Essex, MD;  Location: [facility];  Service: Gastroenterology;  Laterality: N/A;   COLONOSCOPY WITH PROPOFOL N/A 06/17/2021   Procedure: COLONOSCOPY WITH PROPOFOL;  Surgeon: Wilford Corner, MD;  Location: [facility];  Service: Endoscopy;  Laterality: N/A;   ESOPHAGOGASTRODUODENOSCOPY (EGD) WITH PROPOFOL N/A 06/17/2021   Procedure: ESOPHAGOGASTRODUODENOSCOPY (EGD) WITH PROPOFOL;  Surgeon: Wilford Corner, MD;  Location: [facility];  Service: Endoscopy;  Laterality: N/A;   KIDNEY SURGERY  1998   Right.  growth removed   POLYPECTOMY  06/17/2021   Procedure: POLYPECTOMY;  Surgeon: Wilford Corner, MD;  Location: [facility];  Service: Endoscopy;;   POLYPECTOMY  07/28/2022   Procedure: POLYPECTOMY;  Surgeon: Clarene Essex, MD;  Location: [facility];  Service: Gastroenterology;;   rectal prolapse repair  12/05/2011   ROTATOR CUFF REPAIR     Left   SHOULDER ARTHROSCOPY WITH SUBACROMIAL DECOMPRESSION, ROTATOR CUFF REPAIR AND BICEP TENDON REPAIR Left 05/25/2015   Procedure: SHOULDER ARTHROSCOPY WITH SUBACROMIAL DECOMPRESSION, ROTATOR CUFF REPAIR AND BICEP TENODESIS, DEBRIDEMENT. ;  Surgeon: Meredith Pel, MD;  Location: [place];  Service: Orthopedics;  Laterality: Left;  LEFT SHOULDER ROTATOR CUFF TEAR REPAIR, ARTHROSCOPY, DEBRIDEMENT, BICEPS TENODESIS, SUBACROMIAL DECOMPRESSION.   TOTAL ABDOMINAL HYSTERECTOMY     TRIGGER FINGER RELEASE Right 04/30/2018   Procedure: RELEASE TRIGGER FINGER/A-1 PULLEY  RIGHT MIDDLE  AND SMALLn ;  Surgeon: Daryll Brod, MD;  Location: [place];  Service: Orthopedics;  Laterality: Right;  FAB, Bier block   TUBAL LIGATION     Social History:  reports that she quit smoking about 41 years ago. Her smoking use included cigarettes. She has never used smokeless tobacco. She reports that she does not drink alcohol and does not use drugs.  Allergies  Allergen Reactions   Morphine And Related Anaphylaxis   Lactose  Intolerance (Gi) Diarrhea and Nausea Only    Family History  Problem Relation Age of Onset   Heart disease Mother    Throat cancer Father    Cancer Father        stomach   Kidney cancer Brother    Heart disease Brother     Prior to Admission medications   Medication Sig Start Date End Date Taking? Authorizing Provider  acetaminophen (TYLENOL) 500 MG tablet Take 500 mg by mouth every evening.    [provider]  acetaminophen (TYLENOL) 650 MG CR tablet Take 650 mg by mouth in the morning.    [provider]  amLODipine (NORVASC) 10 MG tablet Take 10 mg by mouth in the morning.    [provider]  CINNAMON PO Take 1 capsule by mouth daily.    [provider]  Digestive Enzymes CAPS Take 1 capsule by mouth 2 (two) times daily.    [provider]  donepezil (ARICEPT) 10 MG tablet Take 10 mg by mouth at bedtime. 03/22/22   [provider]  Ferrous Sulfate (IRON) 325 (65 Fe) MG TABS Take 1 tablet (325 mg total) by mouth daily. Patient taking differently: Take 325 mg by mouth in the morning. 06/23/21   [name], DO  lipase/protease/amylase (CREON) 36000 UNITS CPEP capsule Take 36,000-72,000 Units by mouth See admin instructions. 2 capsules (72000 units) 3 times daily with meals, 1 capsule (36000 units) with snacks    [provider]  loperamide (IMODIUM) 2 MG capsule Take 2 mg by mouth 4 (four) times daily as needed for diarrhea or loose stools.    [provider]  metoprolol succinate (TOPROL-XL) 50 MG 24 hr tablet Take 50 mg by mouth in the morning.    [provider]  OVER THE COUNTER MEDICATION Take 1-2 capsules by mouth at bedtime. Relaxium Sleep supplement    [provider]  pantoprazole (PROTONIX) 40 MG tablet Take 1 tablet (40 mg total) by mouth daily. 07/29/22 07/29/23  Barb Merino, MD  Polyethyl Glycol-Propyl Glycol 0.4-0.3 % SOLN Place 1 drop into both eyes daily.    [provider]  pravastatin (PRAVACHOL) 40 MG tablet Take 40 mg by mouth at bedtime.    [provider]    Physical Exam: Vitals:   09/20/22 1315 09/20/22 1330 09/20/22 1345 09/20/22 1400  BP: 134/67 (!) 167/60 137/67 (!) 108/94  Pulse: 64 83 71 68  Resp: '13 17 18 15  '$ Temp:      TempSrc:      SpO2: 100% 100% 100% 100%   General: 81 y.o. female resting in bed in NAD Eyes: PERRL, normal sclera ENMT: Nares patent w/o discharge, orophaynx clear, dentition normal, ears w/o discharge/lesions/ulcers Neck: Supple, trachea midline Cardiovascular: RRR, +S1, S2, no m/g/r, equal pulses throughout Respiratory: CTABL, no w/r/r, normal WOB GI: BS+, NDNT, no masses noted, no organomegaly noted MSK: No e/c/c Neuro: A&O x 3, no focal deficits Psyc: Appropriate interaction and affect, calm/cooperative  Data Reviewed:  Results for orders placed or performed during the hospital encounter of 09/20/22 (from the past 24 hour(s))  CBC with Differential     Status: None   Collection Time: 09/20/22  8:15 AM  Result Value Ref Range   WBC 7.9 4.0 - 10.5 K/uL   RBC 3.95 3.87 - 5.11 MIL/uL   Hemoglobin 12.0 12.0 - 15.0 g/dL   HCT 37.0 36.0 - 46.0 %   MCV 93.7 80.0 - 100.0 fL   MCH 30.4 26.0 - 34.0 pg   MCHC 32.4 30.0 - 36.0 g/dL   RDW 13.3 11.5 - 15.5 %   Platelets 281 150 - 400 K/uL   nRBC 0.0 0.0 - 0.2 %   Neutrophils Relative % 71 %   Neutro Abs 5.6 1.7 - 7.7 K/uL   Lymphocytes Relative 19 %   Lymphs Abs 1.5 0.7 - 4.0 K/uL   Monocytes Relative 6 %   Monocytes Absolute 0.5 0.1 - 1.0 K/uL   Eosinophils Relative 4 %   Eosinophils Absolute 0.3 0.0 - 0.5 K/uL   Basophils Relative 0 %   Basophils Absolute 0.0 0.0 - 0.1 K/uL   Immature Granulocytes 0 %   Abs Immature Granulocytes 0.02 0.00 - 0.07 K/uL  Protime-INR     Status: None   Collection Time: 09/20/22  8:15 AM  Result Value Ref Range   Prothrombin Time 14.1 11.4 - 15.2 seconds   INR 1.1 0.8 - 1.2  Urinalysis, Routine w reflex microscopic  Urine, Clean Catch     Status: Abnormal   Collection Time: 09/20/22  8:15 AM  Result Value Ref Range   Color, Urine STRAW (A) YELLOW   APPearance CLEAR CLEAR   Specific Gravity, Urine 1.014 1.005 - 1.030   pH 5.0 5.0 - 8.0   Glucose, UA NEGATIVE NEGATIVE mg/dL   Hgb urine dipstick NEGATIVE NEGATIVE   Bilirubin Urine NEGATIVE NEGATIVE   Ketones, ur NEGATIVE NEGATIVE mg/dL   Protein, ur NEGATIVE NEGATIVE mg/dL   Nitrite NEGATIVE NEGATIVE   Leukocytes,Ua NEGATIVE NEGATIVE  Type and screen [place]     Status: None   Collection Time: 09/20/22  8:27 AM  Result Value Ref Range   ABO/RH(D) O POS    Antibody Screen NEG    Sample Expiration      09/23/2022,2359 Performed at [facility], [place] [address] [place], [place] [address]   Hemoglobin and hematocrit, blood     Status: Abnormal   Collection Time: 09/20/22 12:09 PM  Result Value Ref Range   Hemoglobin 11.5 (L) 12.0 - 15.0 g/dL   HCT 35.4 (L) 36.0 - 46.0 %  Comprehensive metabolic panel     Status: Abnormal   Collection Time: 09/20/22  1:27 PM  Result Value Ref Range   Sodium 144 135 - 145 mmol/L   Potassium 4.7 3.5 - 5.1 mmol/L   Chloride 117 (H) 98 - 111 mmol/L   CO2 20 (L) 22 - 32 mmol/L   Glucose, Bld 83 70 - 99 mg/dL   BUN 20 8 - 23 mg/dL   Creatinine, Ser 1.26 (H) 0.44 - 1.00 mg/dL   Calcium 9.2 8.9 - 10.3 mg/dL   Total Protein 7.1 6.5 - 8.1 g/dL   Albumin 3.8 3.5 - 5.0 g/dL   AST 17 15 - 41 U/L   ALT 13 0 - 44 U/L   Alkaline Phosphatase 61 38 - 126 U/L   Total Bilirubin 0.5 0.3 - 1.2 mg/dL  GFR, Estimated 43 (L) >60 mL/min   Anion gap 7 5 - 15   Assessment and Plan: GIB     - place in obs, tele     - trend H&H, transfuse for Hgb < 7     - can have CLD for now     - protonix     - [facility] onboard, appreciate assistance     - hold NSAIDs  HTN     - continue home regimen when confirmed    OSA on CPAP     - CPAP qHS  CKD 3b     - at baseline; monitor  Chronic  pancreatic insufficiency     - continue home regimen when confirmed  DM2     - not on medication     - trend glucose  Advance Care Planning:   Code Status: FULL  Consults: [facility]  Family Communication: w/ husband at bedside  Severity of Illness: The appropriate patient status for this patient is OBSERVATION. Observation status is judged to be reasonable and necessary in order to provide the required intensity of service to ensure the patient's safety. The patient's presenting symptoms, physical exam findings, and initial radiographic and laboratory data in the context of their medical condition is felt to place them at decreased risk for further clinical deterioration. Furthermore, it is anticipated that the patient will be medically stable for discharge from the hospital within 2 midnights of admission.   Author: Jonnie Finner, DO 09/20/2022 2:36 PM  For on call review [email or web address].

## 2022-09-20 NOTE — ED Triage Notes (Signed)
Pt reports rectal bleeding x2 days. Pt reports total of 2 episodes.  Pt on eliquis  Pt reports weakness and fatigue.  Hx of GI bleed with recent admission and colonoscopy 9/23 with total of 5 units of PRBC

## 2022-09-20 NOTE — ED Notes (Signed)
Pt ambulated w/ stand by assist to bathroom.  Pt denies dizziness however continues to endorse feeling weak.

## 2022-09-21 DIAGNOSIS — Z90710 Acquired absence of both cervix and uterus: Secondary | ICD-10-CM | POA: Diagnosis not present

## 2022-09-21 DIAGNOSIS — Z79899 Other long term (current) drug therapy: Secondary | ICD-10-CM | POA: Diagnosis not present

## 2022-09-21 DIAGNOSIS — E1122 Type 2 diabetes mellitus with diabetic chronic kidney disease: Secondary | ICD-10-CM | POA: Diagnosis present

## 2022-09-21 DIAGNOSIS — K625 Hemorrhage of anus and rectum: Secondary | ICD-10-CM | POA: Diagnosis present

## 2022-09-21 DIAGNOSIS — K922 Gastrointestinal hemorrhage, unspecified: Secondary | ICD-10-CM | POA: Diagnosis not present

## 2022-09-21 DIAGNOSIS — E440 Moderate protein-calorie malnutrition: Secondary | ICD-10-CM | POA: Diagnosis present

## 2022-09-21 DIAGNOSIS — I10 Essential (primary) hypertension: Secondary | ICD-10-CM | POA: Diagnosis not present

## 2022-09-21 DIAGNOSIS — E7800 Pure hypercholesterolemia, unspecified: Secondary | ICD-10-CM | POA: Diagnosis present

## 2022-09-21 DIAGNOSIS — K219 Gastro-esophageal reflux disease without esophagitis: Secondary | ICD-10-CM | POA: Diagnosis present

## 2022-09-21 DIAGNOSIS — K5731 Diverticulosis of large intestine without perforation or abscess with bleeding: Secondary | ICD-10-CM | POA: Diagnosis present

## 2022-09-21 DIAGNOSIS — Z808 Family history of malignant neoplasm of other organs or systems: Secondary | ICD-10-CM | POA: Diagnosis not present

## 2022-09-21 DIAGNOSIS — Z8249 Family history of ischemic heart disease and other diseases of the circulatory system: Secondary | ICD-10-CM | POA: Diagnosis not present

## 2022-09-21 DIAGNOSIS — G4733 Obstructive sleep apnea (adult) (pediatric): Secondary | ICD-10-CM | POA: Diagnosis present

## 2022-09-21 DIAGNOSIS — N1832 Chronic kidney disease, stage 3b: Secondary | ICD-10-CM | POA: Diagnosis present

## 2022-09-21 DIAGNOSIS — E739 Lactose intolerance, unspecified: Secondary | ICD-10-CM | POA: Diagnosis present

## 2022-09-21 DIAGNOSIS — Z6823 Body mass index (BMI) 23.0-23.9, adult: Secondary | ICD-10-CM | POA: Diagnosis not present

## 2022-09-21 DIAGNOSIS — Z9049 Acquired absence of other specified parts of digestive tract: Secondary | ICD-10-CM | POA: Diagnosis not present

## 2022-09-21 DIAGNOSIS — K8689 Other specified diseases of pancreas: Secondary | ICD-10-CM | POA: Diagnosis present

## 2022-09-21 DIAGNOSIS — I129 Hypertensive chronic kidney disease with stage 1 through stage 4 chronic kidney disease, or unspecified chronic kidney disease: Secondary | ICD-10-CM | POA: Diagnosis present

## 2022-09-21 DIAGNOSIS — Z87891 Personal history of nicotine dependence: Secondary | ICD-10-CM | POA: Diagnosis not present

## 2022-09-21 DIAGNOSIS — Z8051 Family history of malignant neoplasm of kidney: Secondary | ICD-10-CM | POA: Diagnosis not present

## 2022-09-21 LAB — CBC
HCT: 30.1 % — ABNORMAL LOW (ref 36.0–46.0)
HCT: 31.3 % — ABNORMAL LOW (ref 36.0–46.0)
Hemoglobin: 10 g/dL — ABNORMAL LOW (ref 12.0–15.0)
Hemoglobin: 9.9 g/dL — ABNORMAL LOW (ref 12.0–15.0)
MCH: 30.3 pg (ref 26.0–34.0)
MCH: 30.4 pg (ref 26.0–34.0)
MCHC: 31.6 g/dL (ref 30.0–36.0)
MCHC: 33.2 g/dL (ref 30.0–36.0)
MCV: 91.2 fL (ref 80.0–100.0)
MCV: 96 fL (ref 80.0–100.0)
Platelets: 221 10*3/uL (ref 150–400)
Platelets: 245 10*3/uL (ref 150–400)
RBC: 3.26 MIL/uL — ABNORMAL LOW (ref 3.87–5.11)
RBC: 3.3 MIL/uL — ABNORMAL LOW (ref 3.87–5.11)
RDW: 13.2 % (ref 11.5–15.5)
RDW: 13.3 % (ref 11.5–15.5)
WBC: 6.4 10*3/uL (ref 4.0–10.5)
WBC: 7.1 10*3/uL (ref 4.0–10.5)
nRBC: 0 % (ref 0.0–0.2)
nRBC: 0 % (ref 0.0–0.2)

## 2022-09-21 LAB — COMPREHENSIVE METABOLIC PANEL
ALT: 12 U/L (ref 0–44)
AST: 16 U/L (ref 15–41)
Albumin: 3.6 g/dL (ref 3.5–5.0)
Alkaline Phosphatase: 61 U/L (ref 38–126)
Anion gap: 3 — ABNORMAL LOW (ref 5–15)
BUN: 17 mg/dL (ref 8–23)
CO2: 20 mmol/L — ABNORMAL LOW (ref 22–32)
Calcium: 8.8 mg/dL — ABNORMAL LOW (ref 8.9–10.3)
Chloride: 116 mmol/L — ABNORMAL HIGH (ref 98–111)
Creatinine, Ser: 1.34 mg/dL — ABNORMAL HIGH (ref 0.44–1.00)
GFR, Estimated: 40 mL/min — ABNORMAL LOW (ref >60)
Glucose, Bld: 86 mg/dL (ref 70–99)
Potassium: 5.2 mmol/L — ABNORMAL HIGH (ref 3.5–5.1)
Sodium: 139 mmol/L (ref 135–145)
Total Bilirubin: 0.7 mg/dL (ref 0.3–1.2)
Total Protein: 7.1 g/dL (ref 6.5–8.1)

## 2022-09-21 LAB — BASIC METABOLIC PANEL
Anion gap: 7 (ref 5–15)
BUN: 14 mg/dL (ref 8–23)
CO2: 21 mmol/L — ABNORMAL LOW (ref 22–32)
Calcium: 9.2 mg/dL (ref 8.9–10.3)
Chloride: 117 mmol/L — ABNORMAL HIGH (ref 98–111)
Creatinine, Ser: 1.14 mg/dL — ABNORMAL HIGH (ref 0.44–1.00)
GFR, Estimated: 48 mL/min — ABNORMAL LOW (ref >60)
Glucose, Bld: 86 mg/dL (ref 70–99)
Potassium: 5.2 mmol/L — ABNORMAL HIGH (ref 3.5–5.1)
Sodium: 145 mmol/L (ref 135–145)

## 2022-09-21 LAB — HEMOGLOBIN AND HEMATOCRIT, BLOOD
HCT: 30.4 % — ABNORMAL LOW (ref 36.0–46.0)
Hemoglobin: 9.9 g/dL — ABNORMAL LOW (ref 12.0–15.0)

## 2022-09-21 LAB — CBC WITH DIFFERENTIAL/PLATELET
Abs Immature Granulocytes: 0.02 10*3/uL (ref 0.00–0.07)
Basophils Absolute: 0 10*3/uL (ref 0.0–0.1)
Basophils Relative: 1 %
Eosinophils Absolute: 0.2 10*3/uL (ref 0.0–0.5)
Eosinophils Relative: 4 %
HCT: 30.5 % — ABNORMAL LOW (ref 36.0–46.0)
Hemoglobin: 10.1 g/dL — ABNORMAL LOW (ref 12.0–15.0)
Immature Granulocytes: 0 %
Lymphocytes Relative: 19 %
Lymphs Abs: 1.2 10*3/uL (ref 0.7–4.0)
MCH: 30.6 pg (ref 26.0–34.0)
MCHC: 33.1 g/dL (ref 30.0–36.0)
MCV: 92.4 fL (ref 80.0–100.0)
Monocytes Absolute: 0.4 10*3/uL (ref 0.1–1.0)
Monocytes Relative: 7 %
Neutro Abs: 4.3 10*3/uL (ref 1.7–7.7)
Neutrophils Relative %: 69 %
Platelets: 214 10*3/uL (ref 150–400)
RBC: 3.3 MIL/uL — ABNORMAL LOW (ref 3.87–5.11)
RDW: 13.3 % (ref 11.5–15.5)
WBC: 6.1 10*3/uL (ref 4.0–10.5)
nRBC: 0 % (ref 0.0–0.2)

## 2022-09-21 LAB — URINE CULTURE: Culture: <10000 — AB

## 2022-09-21 NOTE — Progress Notes (Signed)
[facility] Gastroenterology Progress Note  Stephanie Barber 81 y.o. 02-21-1941  Subjective: Patient seen and examined at bedside, husband in room.  Son present via phone.  Patient states she had a bowel movement this morning that was mostly formed stool but had some red streaks in it.  Confirmed by nursing documentation.  Reports she has a little left-sided abdominal soreness today but otherwise denies abdominal pain.  Denies nausea, vomiting, fever, chills, chest pain/shortness of breath.  Tolerating liquids.  ROS : Review of Systems  Constitutional:  Negative for chills and fever.  Gastrointestinal:  Positive for abdominal pain and blood in stool. Negative for constipation, diarrhea, heartburn, melena, nausea and vomiting.      Objective: Vital signs in last 24 hours: Vitals:   09/21/22 0618 09/21/22 0951  BP: 135/73 133/66  Pulse: (!) 57 (!) 59  Resp: 16 17  Temp: 98.1 F (36.7 C) 98.1 F (36.7 C)  SpO2: 97% 100%    Physical Exam:  General:  Alert, cooperative, no distress, appears stated age  Head:  Normocephalic, without obvious abnormality, atraumatic  Eyes:  Anicteric sclera, EOM's intact  Lungs:   Clear to auscultation bilaterally, respirations unlabored  Heart:  Regular rate and rhythm, S1, S2 normal  Abdomen:   Soft, mild tenderness to palpation of left abdomen, bowel sounds active all four quadrants  Extremities: Extremities normal, atraumatic, no  edema  Pulses: 2+ and symmetric    Lab Results: Recent Labs    09/20/22 2357 09/21/22 0812  NA 139 145  K 5.2* 5.2*  CL 116* 117*  CO2 20* 21*  GLUCOSE 86 86  BUN 17 14  CREATININE 1.34* 1.14*  CALCIUM 8.8* 9.2   Recent Labs    09/20/22 1327 09/20/22 2357  AST 17 16  ALT 13 12  ALKPHOS 61 61  BILITOT 0.5 0.7  PROT 7.1 7.1  ALBUMIN 3.8 3.6   Recent Labs    09/20/22 0815 09/20/22 1209 09/20/22 2357 09/20/22 2359 09/21/22 0812  WBC 7.9  --  6.4  --  6.1  NEUTROABS 5.6  --   --   --  4.3  HGB 12.0    < > 9.9* 9.9* 10.1*  HCT 37.0   < > 31.3* 30.4* 30.5*  MCV 93.7  --  96.0  --  92.4  PLT 281  --  245  --  214   < > = values in this interval not displayed.   Recent Labs    09/20/22 0815  LABPROT 14.1  INR 1.1    Assessment Lower GI bleed, likely diverticular - History of admission for diverticular bleeding September 2023.  Had colonoscopy at that time by Dr. Watt Climes which revealed hemorrhoids, patent end-to-side colocolonic anastomosis, diverticulosis, 2 small polyps.  She had 2 negative CT angiograms and GI bleeding scan during admission. - Hemoglobin stable today at 10.1 - 1 bowel movement so far today, type 4, brown, red streaks.  Plan: If worsening/continued bleeding today and/or drop in hemoglobin would order CT angiogram with embolization by IR if positive.  Continue clear liquids. Continue supportive care.  GI will follow.  Angelique Holm PA-C 09/21/2022, 10:59 AM  Contact #  [phone]

## 2022-09-21 NOTE — Progress Notes (Signed)
PROGRESS NOTE    Stephanie Barber  LZJ:[id number] DOB: 04/11/1941 DOA: 09/20/2022 PCP: [name], MD    Chief Complaint  Patient presents with   Rectal Bleeding    Brief Narrative:  Patient is a pleasant 81 year old female, history of diverticulosis, diverticular bleed in September 2023, prior colonoscopy that showed diffuse diverticulosis and normal-appearing colocolonic anastomosis in the rectosigmoid region, prior sigmoid colectomy due to rectal prolapse, diabetes, hypertension, hyperlipidemia presenting to the ED with bright red blood per rectum which had become more profuse on the day of admission.  GI consulted and following.   Assessment & Plan:   Principal Problem:   GIB (gastrointestinal bleeding) Active Problems:   Type 2 diabetes mellitus (HCC)   Hypertension   OSA on CPAP   Stage 3b chronic kidney disease (CKD) ([place])   Pancreatic insufficiency  #1 lower GI bleed likely diverticular bleed -Patient noted with a hemoglobin of 11.5 (09/20/2022), presenting with bright red blood per rectum initially with no abdominal pain now with lower abdominal discomfort. -Patient states had bloody bowel movement overnight and this morning. -Hemoglobin currently stable at 10.1. -Tolerating clears which we will continue for now. -Repeat CBC this afternoon. -Hydrate with IV fluids. -GI consulted and per GI if ongoing severe bleeding with drop in hemoglobin I recommended CT angiogram of the abdomen and pelvis with evaluation by IR for possible embolization. -Continue PPI. -Per GI.  2.  Hypertension -Continue current regimen of Norvasc 10 mg daily, Toprol-XL 50 mg daily.  3.  OSA on CPAP -CPAP nightly.  4.  CKD stage IIIb -Stable.  5.  Chronic pancreatic insufficiency -Continue pancreatic enzymes.  6.  Diabetes mellitus type 2 -Not on any medications prior to admission. -Hemoglobin A1c noted at 6.2 05/13/2015. -Repeat hemoglobin A1c. -CBG 93 this  morning. -Follow-up.   DVT prophylaxis: SCDs Code Status: Full Family Communication: Updated patient and husband at bedside. Disposition: Likely home when clinically improved, resolution of GI bleed, when cleared by gastroenterology.  Status is: Inpatient The patient will require care spanning > 2 midnights and should be moved to inpatient because: Severity of illness   Consultants:  Gastroenterology: Dr. Michail Sermon 09/20/2022  Procedures:  None  Antimicrobials:  None   Subjective: Sitting up in chair.  Husband at bedside trying to figure out how to put on the ground.  Patient stated had some bloody bowel movements last night and this morning but not as much as prior to admission.  Complaining of lower abdominal pain/discomfort.  No nausea or vomiting.  No chest pain.  No shortness of breath.  Objective: Vitals:   09/20/22 2227 09/21/22 0215 09/21/22 0618 09/21/22 0951  BP: (!) 140/76 136/69 135/73 133/66  Pulse: 60 60 (!) 57 (!) 59  Resp: '17 17 16 17  '$ Temp: 98.6 F (37 C) 98.3 F (36.8 C) 98.1 F (36.7 C) 98.1 F (36.7 C)  TempSrc: Oral Oral Oral Oral  SpO2: 98% 97% 97% 100%  Weight:      Height:        Intake/Output Summary (Last 24 hours) at 09/21/2022 1141 Last data filed at 09/21/2022 0240 Gross per 24 hour  Intake 2613.41 ml  Output 925 ml  Net 1688.41 ml   Filed Weights   09/20/22 1752  Weight: 51.7 kg    Examination:  General exam: NAD Respiratory system: Lungs clear to auscultation bilaterally.  No wheezes, no crackles, no rhonchi.  Fair air movement.  Speaking in full sentences.   Cardiovascular system: Regular rate rhythm  no murmurs rubs or gallops.  No JVD.  No lower extremity edema.   Gastrointestinal system: Abdomen is nondistended, soft and some tenderness to palpation in the lower abdominal region.  Positive bowel sounds.  No rebound.  No guarding.  Central nervous system: Alert and oriented. No focal neurological deficits. Extremities:  Symmetric 5 x 5 power. Skin: No rashes, lesions or ulcers Psychiatry: Judgement and insight appear normal. Mood & affect appropriate.     Data Reviewed: I have personally reviewed following labs and imaging studies  CBC: Recent Labs  Lab 09/20/22 0815 09/20/22 1209 09/20/22 1808 09/20/22 2357 09/20/22 2359 09/21/22 0812  WBC 7.9  --   --  6.4  --  6.1  NEUTROABS 5.6  --   --   --   --  4.3  HGB 12.0 11.5* 10.9* 9.9* 9.9* 10.1*  HCT 37.0 35.4* 33.5* 31.3* 30.4* 30.5*  MCV 93.7  --   --  96.0  --  92.4  PLT 281  --   --  245  --  704    Basic Metabolic Panel: Recent Labs  Lab 09/20/22 1327 09/20/22 2357 09/21/22 0812  NA 144 139 145  K 4.7 5.2* 5.2*  CL 117* 116* 117*  CO2 20* 20* 21*  GLUCOSE 83 86 86  BUN '20 17 14  '$ CREATININE 1.26* 1.34* 1.14*  CALCIUM 9.2 8.8* 9.2    GFR: Estimated Creatinine Clearance: 25.9 mL/min (A) (by C-G formula based on SCr of 1.14 mg/dL (H)).  Liver Function Tests: Recent Labs  Lab 09/20/22 1327 09/20/22 2357  AST 17 16  ALT 13 12  ALKPHOS 61 61  BILITOT 0.5 0.7  PROT 7.1 7.1  ALBUMIN 3.8 3.6    CBG: No results for input(s): "GLUCAP" in the last 168 hours.   Recent Results (from the past 240 hour(s))  Urine Culture     Status: Abnormal   Collection Time: 09/20/22  8:15 AM   Specimen: Urine, Clean Catch  Result Value Ref Range Status   Specimen Description   Final    URINE, CLEAN CATCH Performed at [facility], [place] [address], [place], [place] [address]    Special Requests   Final    NONE Performed at [facility], [place] [address] [place], [place] [address]    Culture (A)  Final    <10,000 COLONIES/mL INSIGNIFICANT GROWTH Performed at [place] [address], [place], [place] [address]    Report Status 09/21/2022 FINAL  Final         Radiology Studies: No results found.      Scheduled Meds:  amLODipine  10 mg Oral Daily   lipase/protease/amylase   36,000 Units Oral BID WC   metoprolol succinate  50 mg Oral Daily   pantoprazole  40 mg Oral Daily   Continuous Infusions:  sodium chloride 100 mL/hr at 09/21/22 0404     LOS: 0 days    Time spent: 35 minutes    Irine Seal, MD [facility]   To contact the attending provider between 7A-7P or the covering provider during after hours 7P-7A, please log into the web site [email or web address] and access using universal [place] password for that web site. If you do not have the password, please call the hospital operator.  09/21/2022, 11:41 AM

## 2022-09-22 LAB — CBC WITH DIFFERENTIAL/PLATELET
Abs Immature Granulocytes: 0.02 10*3/uL (ref 0.00–0.07)
Basophils Absolute: 0 10*3/uL (ref 0.0–0.1)
Basophils Relative: 1 %
Eosinophils Absolute: 0.3 10*3/uL (ref 0.0–0.5)
Eosinophils Relative: 5 %
HCT: 27.8 % — ABNORMAL LOW (ref 36.0–46.0)
Hemoglobin: 9.1 g/dL — ABNORMAL LOW (ref 12.0–15.0)
Immature Granulocytes: 0 %
Lymphocytes Relative: 25 %
Lymphs Abs: 1.5 10*3/uL (ref 0.7–4.0)
MCH: 30.8 pg (ref 26.0–34.0)
MCHC: 32.7 g/dL (ref 30.0–36.0)
MCV: 94.2 fL (ref 80.0–100.0)
Monocytes Absolute: 0.6 10*3/uL (ref 0.1–1.0)
Monocytes Relative: 10 %
Neutro Abs: 3.5 10*3/uL (ref 1.7–7.7)
Neutrophils Relative %: 59 %
Platelets: 217 10*3/uL (ref 150–400)
RBC: 2.95 MIL/uL — ABNORMAL LOW (ref 3.87–5.11)
RDW: 13.2 % (ref 11.5–15.5)
WBC: 6 10*3/uL (ref 4.0–10.5)
nRBC: 0 % (ref 0.0–0.2)

## 2022-09-22 LAB — BASIC METABOLIC PANEL
Anion gap: 5 (ref 5–15)
BUN: 12 mg/dL (ref 8–23)
CO2: 19 mmol/L — ABNORMAL LOW (ref 22–32)
Calcium: 8.9 mg/dL (ref 8.9–10.3)
Chloride: 117 mmol/L — ABNORMAL HIGH (ref 98–111)
Creatinine, Ser: 1.05 mg/dL — ABNORMAL HIGH (ref 0.44–1.00)
GFR, Estimated: 53 mL/min — ABNORMAL LOW (ref >60)
Glucose, Bld: 86 mg/dL (ref 70–99)
Potassium: 4.2 mmol/L (ref 3.5–5.1)
Sodium: 141 mmol/L (ref 135–145)

## 2022-09-22 LAB — CBC
HCT: 34.8 % — ABNORMAL LOW (ref 36.0–46.0)
Hemoglobin: 11.2 g/dL — ABNORMAL LOW (ref 12.0–15.0)
MCH: 30.5 pg (ref 26.0–34.0)
MCHC: 32.2 g/dL (ref 30.0–36.0)
MCV: 94.8 fL (ref 80.0–100.0)
Platelets: 256 10*3/uL (ref 150–400)
RBC: 3.67 MIL/uL — ABNORMAL LOW (ref 3.87–5.11)
RDW: 13.2 % (ref 11.5–15.5)
WBC: 7 10*3/uL (ref 4.0–10.5)
nRBC: 0 % (ref 0.0–0.2)

## 2022-09-22 LAB — MAGNESIUM: Magnesium: 1.4 mg/dL — ABNORMAL LOW (ref 1.7–2.4)

## 2022-09-22 LAB — HEMOGLOBIN A1C
Hgb A1c MFr Bld: 5.8 % — ABNORMAL HIGH (ref 4.8–5.6)
Mean Plasma Glucose: 119.76 mg/dL

## 2022-09-22 MED ORDER — MAGNESIUM SULFATE 4 GM/100ML IV SOLN
4.0000 g | Freq: Once | INTRAVENOUS | Status: AC
  Administered 2022-09-22: 4 g via INTRAVENOUS
  Filled 2022-09-22: qty 100

## 2022-09-22 MED ORDER — ENSURE ENLIVE PO LIQD
237.0000 mL | Freq: Two times a day (BID) | ORAL | Status: DC
  Administered 2022-09-22 – 2022-09-23 (×3): 237 mL via ORAL

## 2022-09-22 MED ORDER — ADULT MULTIVITAMIN W/MINERALS CH
1.0000 | ORAL_TABLET | Freq: Every day | ORAL | Status: DC
  Administered 2022-09-22 – 2022-09-23 (×2): 1 via ORAL
  Filled 2022-09-22 (×2): qty 1

## 2022-09-22 NOTE — Plan of Care (Signed)
  Problem: Education: Goal: Knowledge of General Education information will improve Description: Including pain rating scale, medication(s)/side effects and non-pharmacologic comfort measures Outcome: Progressing   Problem: Health Behavior/Discharge Planning: Goal: Ability to manage health-related needs will improve Outcome: Progressing   Problem: Clinical Measurements: Goal: Ability to maintain clinical measurements within normal limits will improve Outcome: Progressing Goal: Will remain free from infection Outcome: Progressing Goal: Diagnostic test results will improve Outcome: Progressing Goal: Respiratory complications will improve Outcome: Progressing Goal: Cardiovascular complication will be avoided Outcome: Progressing   Problem: Activity: Goal: Risk for activity intolerance will decrease Outcome: Progressing   Problem: Nutrition: Goal: Adequate nutrition will be maintained Outcome: Progressing   Problem: Coping: Goal: Level of anxiety will decrease Outcome: Progressing   Problem: Elimination: Goal: Will not experience complications related to urinary retention Outcome: Progressing   Problem: Pain Managment: Goal: General experience of comfort will improve Outcome: Progressing

## 2022-09-22 NOTE — Progress Notes (Signed)
[facility] Gastroenterology Progress Note  Stephanie Barber 81 y.o. September 18, 1941   Subjective: Patient seen and examined sitting at bedside, playing cards with husband.  Was able to ambulate this morning and denies chest pain, shortness of breath, dizziness.  Reports she had 2-3 bowel movements this morning which were bloody, reports blood is less than it was yesterday, is concerned that she is still bleeding.  Having some abdominal soreness same as yesterday.  Denies nausea, vomiting, fever, chills.  ROS : Review of Systems  Constitutional:  Negative for chills and fever.  Respiratory:  Negative for shortness of breath.   Cardiovascular:  Negative for chest pain.  Gastrointestinal:  Positive for abdominal pain, blood in stool and diarrhea. Negative for constipation, heartburn, melena, nausea and vomiting.  Genitourinary:  Negative for dysuria.  Neurological:  Negative for dizziness.      Objective: Vital signs in last 24 hours: Vitals:   09/22/22 0540 09/22/22 0819  BP: (!) 123/59 138/68  Pulse: 60 60  Resp: 17 16  Temp: 98.4 F (36.9 C) 98.9 F (37.2 C)  SpO2: 98% 98%    Physical Exam:  General:  Alert, cooperative, no distress, appears stated age  Head:  Normocephalic, without obvious abnormality, atraumatic  Eyes:  Anicteric sclera, EOM's intact  Lungs:   Clear to auscultation bilaterally, respirations unlabored  Heart:  Regular rate and rhythm, S1, S2 normal  Abdomen:   Soft, non-tender, bowel sounds active all four quadrants  Extremities: Extremities normal, atraumatic, no  edema  Pulses: 2+ and symmetric    Lab Results: Recent Labs    09/21/22 0812 09/22/22 0319  NA 145 141  K 5.2* 4.2  CL 117* 117*  CO2 21* 19*  GLUCOSE 86 86  BUN 14 12  CREATININE 1.14* 1.05*  CALCIUM 9.2 8.9  MG  --  1.4*   Recent Labs    09/20/22 1327 09/20/22 2357  AST 17 16  ALT 13 12  ALKPHOS 61 61  BILITOT 0.5 0.7  PROT 7.1 7.1  ALBUMIN 3.8 3.6   Recent Labs     09/21/22 0812 09/21/22 1512 09/22/22 0319  WBC 6.1 7.1 6.0  NEUTROABS 4.3  --  3.5  HGB 10.1* 10.0* 9.1*  HCT 30.5* 30.1* 27.8*  MCV 92.4 91.2 94.2  PLT 214 221 217   Recent Labs    09/20/22 0815  LABPROT 14.1  INR 1.1    Assessment Lower GI bleed, likely diverticular - History of admission for diverticular bleeding September 2023.  Had colonoscopy at that time by Dr. Watt Climes which revealed hemorrhoids, patent end-to-side colocolonic anastomosis, diverticulosis, 2 small polyps.  She had 2 negative CT angiograms and GI bleeding scan during admission. - Hemoglobin 9.1 (10.0) - 2-3 bowel movements this morning, bloody, decreased amount of blood per patient  Plan: Bleeding continues today, lesser amount per patient. Hgb 9.1. If bleeding worsens or hemoglobin continues to drop can order CT angiogram.  Monitor bowel movements. Continue daily CBC and transfuse as needed to maintain HGB > 7  Continue supportive care. GI will follow.  Angelique Holm PA-C 09/22/2022, 10:03 AM  Contact #  [phone]

## 2022-09-22 NOTE — Progress Notes (Addendum)
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  GI consulted and following.   Assessment & Plan:   Principal Problem:   GIB (gastrointestinal bleeding) Active Problems:   Type 2 diabetes mellitus (HCC)   Hypertension   OSA on CPAP   Stage 3b chronic kidney disease (CKD) ([place])   Pancreatic insufficiency  #1 lower GI bleed likely diverticular bleed -Patient noted with a hemoglobin of 11.5 (09/20/2022), presenting with bright red blood per rectum initially with no abdominal pain now with lower abdominal discomfort which is slowly improving.. -Patient states still with bloody bowel movements.  -Hemoglobin currently stable at 9.1 this morning. -Was on clears, diet advanced to full liquid diet which she tolerated and has been advanced to a soft diet per GI today. -Repeat CBC this afternoon. -Saline lock IV fluids. -GI consulted and per GI if ongoing severe bleeding with drop in hemoglobin I recommended CT angiogram of the abdomen and pelvis with evaluation by IR for possible embolization. -Continue PPI. -Per GI.  2.  Hypertension -Controlled on home regimen of Norvasc, Toprol-XL.   3.  OSA on CPAP -CPAP nightly.  4.  CKD stage IIIb -Stable.  5.  Chronic pancreatic insufficiency -Continue pancreatic enzymes.  6.  Diabetes mellitus type 2 -Not on any medications prior to admission. -Hemoglobin A1c noted at 6.2  05/13/2015. -Repeat hemoglobin A1c 5.8. -Blood glucose of 86 on labs this morning.   -Outpatient follow-up.  7.  Hypomagnesemia -Magnesium at 1.4. -Magnesium sulfate 4 g IV x1. -Repeat labs in the AM.   DVT prophylaxis: SCDs Code Status: Full Family Communication: Updated patient and husband at bedside. Disposition: Likely home when clinically improved, resolution of GI bleed, when cleared by gastroenterology hopefully in the next 1 to 2 days.  Status is: Inpatient The patient will require care spanning > 2 midnights and should be moved to inpatient because: Severity of illness   Consultants:  Gastroenterology: Dr. Michail Sermon 09/20/2022  Procedures:  None  Antimicrobials:  None   Subjective: Still with some maroon colored stools per patient. No CP, no shortness of breath.  No abdominal pain.  Husband at bedside.   Objective: Vitals:   09/21/22 1351 09/21/22 2148 09/22/22 0540 09/22/22 0819  BP: (!) 145/66 134/68 (!) 123/59 138/68  Pulse: 61 63 60 60  Resp: '17 16 17 16  '$ Temp: 98.1 F (36.7 C) 98.7 F (37.1 C) 98.4 F (36.9 C) 98.9 F (37.2 C)  TempSrc: Oral  Oral Oral  SpO2: 100% 99% 98% 98%  Weight:      Height:        Intake/Output Summary (Last 24 hours) at 09/22/2022 1309 Last data filed at 09/22/2022 1000 Gross per 24 hour  Intake 3512.51 ml  Output 950 ml  Net 2562.51 ml    Filed Weights   09/20/22 1752  Weight: 51.7 kg    Examination:  General exam: NAD Respiratory system: CTA B.  No wheezes, no crackles, no rhonchi.  Fair air movement.  Speaking  in full sentences.  Cardiovascular system: RRR no murmurs rubs or gallops.  No JVD.  No lower extremity edema.  Gastrointestinal system: Abdomen soft, decreased tenderness to palpation lower abdominal region, positive bowel sounds, no rebound, no guarding.  Central nervous system: Alert and oriented. No focal neurological deficits. Extremities: Symmetric 5 x 5 power. Skin: No rashes, lesions or  ulcers Psychiatry: Judgement and insight appear normal. Mood & affect appropriate.     Data Reviewed: I have personally reviewed following labs and imaging studies  CBC: Recent Labs  Lab 09/20/22 0815 09/20/22 1209 09/20/22 2357 09/20/22 2359 09/21/22 0812 09/21/22 1512 09/22/22 0319  WBC 7.9  --  6.4  --  6.1 7.1 6.0  NEUTROABS 5.6  --   --   --  4.3  --  3.5  HGB 12.0   < > 9.9* 9.9* 10.1* 10.0* 9.1*  HCT 37.0   < > 31.3* 30.4* 30.5* 30.1* 27.8*  MCV 93.7  --  96.0  --  92.4 91.2 94.2  PLT 281  --  245  --  214 221 217   < > = values in this interval not displayed.     Basic Metabolic Panel: Recent Labs  Lab 09/20/22 1327 09/20/22 2357 09/21/22 0812 09/22/22 0319  NA 144 139 145 141  K 4.7 5.2* 5.2* 4.2  CL 117* 116* 117* 117*  CO2 20* 20* 21* 19*  GLUCOSE 83 86 86 86  BUN '20 17 14 12  '$ CREATININE 1.26* 1.34* 1.14* 1.05*  CALCIUM 9.2 8.8* 9.2 8.9  MG  --   --   --  1.4*     GFR: Estimated Creatinine Clearance: 28.2 mL/min (A) (by C-G formula based on SCr of 1.05 mg/dL (H)).  Liver Function Tests: Recent Labs  Lab 09/20/22 1327 09/20/22 2357  AST 17 16  ALT 13 12  ALKPHOS 61 61  BILITOT 0.5 0.7  PROT 7.1 7.1  ALBUMIN 3.8 3.6     CBG: No results for input(s): "GLUCAP" in the last 168 hours.   Recent Results (from the past 240 hour(s))  Urine Culture     Status: Abnormal   Collection Time: 09/20/22  8:15 AM   Specimen: Urine, Clean Catch  Result Value Ref Range Status   Specimen Description   Final    URINE, CLEAN CATCH Performed at [facility], [place] [address], [place], [place] [address]    Special Requests   Final    NONE Performed at [facility], [place] [address] [place], [place] [address]    Culture (A)  Final    <10,000 COLONIES/mL INSIGNIFICANT GROWTH Performed at [place] [address] [place],  [address]    Report Status 09/21/2022 FINAL  Final         Radiology  Studies: No results found.      Scheduled Meds:  amLODipine  10 mg Oral Daily   feeding supplement  237 mL Oral BID BM   lipase/protease/amylase  36,000 Units Oral BID WC   metoprolol succinate  50 mg Oral Daily   multivitamin with minerals  1 tablet Oral Daily   pantoprazole  40 mg Oral Daily   Continuous Infusions:     LOS: 1 day    Time spent: 35 minutes    Irine Seal, MD [facility]   To contact the attending provider between 7A-7P or the covering provider during after hours 7P-7A, please log into the web site [email or web address] and  access using universal [place] password for that web site. If you do not have the password, please call the hospital operator.  09/22/2022, 1:09 PM

## 2022-09-22 NOTE — Progress Notes (Signed)
Initial Nutrition Assessment  DOCUMENTATION CODES:   Non-severe (moderate) malnutrition in context of chronic illness  INTERVENTION:   -Ensure Plus High Protein po BID, each supplement provides 350 kcal and 20 grams of protein.   -Multivitamin with minerals daily  NUTRITION DIAGNOSIS:   Moderate Malnutrition related to chronic illness as evidenced by moderate fat depletion, mild muscle depletion, energy intake < or equal to 75% for > or equal to 1 month.  GOAL:   Patient will meet greater than or equal to 90% of their needs  MONITOR:   PO intake, Supplement acceptance, Weight trends, I & O's, Labs  REASON FOR ASSESSMENT:   Malnutrition Screening Tool    ASSESSMENT:   81 year old female, history of diverticulosis, diverticular bleed in September 2023, prior colonoscopy that showed diffuse diverticulosis and normal-appearing colocolonic anastomosis in the rectosigmoid region, prior sigmoid colectomy due to rectal prolapse, diabetes, hypertension, hyperlipidemia presenting to the ED with bright red blood per rectum which had become more profuse on the day of admission.  Patient in room with husband at bedside. They are playing a card game. Pt reports she doesn't eat much at home. States she may eat twice a day, consumes items like oatmeal, grits, cereal, Lactaid milk, raisin toast, or egg whites. For dinner she may have soups, 1 chicken wing or leg, 1/2 chicken breast. Pt drinks Pedialyte and Ensure -tolerates as these are lactose free.  Pt reports she takes Vitamin D and a MVI at home.  Pt agreeable to receiving Ensure and daily MVI. Noted that diet was advanced to soft by GI.  Per patient, she weighed ~133 lbs in September 2023. Weight was as low as 98 lbs. She gained back up to 119 lbs. Now her weight is 113 lbs.  Medications: Creon, IV Mg sulfate  Labs reviewed: Low Mg   NUTRITION - FOCUSED PHYSICAL EXAM:  Flowsheet Row Most Recent Value  Orbital Region Mild depletion   Upper Arm Region Moderate depletion  Thoracic and Lumbar Region Moderate depletion  Buccal Region Mild depletion  Temple Region Mild depletion  Clavicle Bone Region No depletion  Clavicle and Acromion Bone Region No depletion  Scapular Bone Region No depletion  Dorsal Hand Moderate depletion  Patellar Region Unable to assess  Anterior Thigh Region Unable to assess  Posterior Calf Region Unable to assess  Edema (RD Assessment) None  Hair Reviewed  Eyes Reviewed  Mouth Reviewed  Skin Reviewed       Diet Order:   Diet Order             DIET SOFT Room service appropriate? Yes; Fluid consistency: Thin  Diet effective now                   EDUCATION NEEDS:   No education needs have been identified at this time  Skin:  Skin Assessment: Reviewed RN Assessment  Last BM:  11/9- type 5  Height:   Ht Readings from Last 1 Encounters:  09/20/22 '4\' 11"'$  (1.499 m)    Weight:   Wt Readings from Last 1 Encounters:  09/20/22 51.7 kg    BMI:  Body mass index is 23.02 kg/m.  Estimated Nutritional Needs:   Kcal:  1550-1750  Protein:  75-85g  Fluid:  1.7L/day   Clayton Bibles, MS, RD, LDN Inpatient Clinical Dietitian Contact information available via Amion

## 2022-09-22 NOTE — Progress Notes (Signed)
  Transition of Care [facility]) Screening Note   Patient Details  Name: Stephanie Barber Date of Birth: 1941/03/12   Transition of Care [facility]) CM/SW Contact:    Lennart Pall, LCSW Phone Number: 09/22/2022, 2:46 PM    Transition of Care Department [facility]) has reviewed patient and no TOC needs have been identified at this time. We will continue to monitor patient advancement through interdisciplinary progression rounds. If new patient transition needs arise, please place a TOC consult.

## 2022-09-23 DIAGNOSIS — K8689 Other specified diseases of pancreas: Secondary | ICD-10-CM

## 2022-09-23 DIAGNOSIS — G4733 Obstructive sleep apnea (adult) (pediatric): Secondary | ICD-10-CM

## 2022-09-23 LAB — BASIC METABOLIC PANEL
Anion gap: 4 — ABNORMAL LOW (ref 5–15)
BUN: 18 mg/dL (ref 8–23)
CO2: 21 mmol/L — ABNORMAL LOW (ref 22–32)
Calcium: 9.3 mg/dL (ref 8.9–10.3)
Chloride: 116 mmol/L — ABNORMAL HIGH (ref 98–111)
Creatinine, Ser: 1.23 mg/dL — ABNORMAL HIGH (ref 0.44–1.00)
GFR, Estimated: 44 mL/min — ABNORMAL LOW (ref >60)
Glucose, Bld: 90 mg/dL (ref 70–99)
Potassium: 4.4 mmol/L (ref 3.5–5.1)
Sodium: 141 mmol/L (ref 135–145)

## 2022-09-23 LAB — CBC
HCT: 28.6 % — ABNORMAL LOW (ref 36.0–46.0)
Hemoglobin: 9.1 g/dL — ABNORMAL LOW (ref 12.0–15.0)
MCH: 29.7 pg (ref 26.0–34.0)
MCHC: 31.8 g/dL (ref 30.0–36.0)
MCV: 93.5 fL (ref 80.0–100.0)
Platelets: 225 10*3/uL (ref 150–400)
RBC: 3.06 MIL/uL — ABNORMAL LOW (ref 3.87–5.11)
RDW: 13.3 % (ref 11.5–15.5)
WBC: 5.8 10*3/uL (ref 4.0–10.5)
nRBC: 0 % (ref 0.0–0.2)

## 2022-09-23 LAB — MAGNESIUM: Magnesium: 2.3 mg/dL (ref 1.7–2.4)

## 2022-09-23 NOTE — Discharge Summary (Signed)
Physician Discharge Summary  Stephanie Barber KGY:[id number] DOB: 05-08-1941 DOA: 09/20/2022  PCP: [name], MD  Admit date: 09/20/2022 Discharge date: 09/23/2022  Time spent: 55 minutes  Recommendations for Outpatient Follow-up:  Follow-up with [name], MD in 2 weeks.  On follow-up patient will need a CBC done to follow-up on hemoglobin.  Patient also need a basic metabolic profile and magnesium level checked to follow-up on electrolytes and renal function. Follow-up with Dr. Michail Sermon, gastroenterology in 6 to 8 weeks.   Discharge Diagnoses:  Principal Problem:   GIB (gastrointestinal bleeding) Active Problems:   Type 2 diabetes mellitus (HCC)   Hypertension   OSA on CPAP   Stage 3b chronic kidney disease (CKD) ([place])   Pancreatic insufficiency   Discharge Condition: Stable and improved.  Diet recommendation: Soft diet and then high-fiber diet in December per GI recommendations.  Filed Weights   09/20/22 1752  Weight: 51.7 kg    History of present illness:  HPI per Dr. Elmer Ramp Stephanie Barber is a 81 y.o. female with medical history significant of HTN, chronic pancreatic insuffiencey, OSA, DVT. Presenting with rectal bleeding. She was in her normal state of health until 2 days ago. At that time, she had two episodes of dark stools w/ some BRBPR as well. She didn't have any abdominal pain. She did not have have any N/V or fevers. She did not have any lightheadedness or dizziness. She reports normal stools yesterday. However, this morning her dark stools returned. She decided at that point to come to the ED for evaluation. She reports that she had an admission a couple of months ago for the same. During that admission, she required 5 units of blood. She denies any other aggravating or alleviating factors.    Hospital Course:  #1 lower GI bleed likely diverticular bleed -Patient noted with a hemoglobin of 11.5 (09/20/2022), presented with bright red blood per rectum  initially with no abdominal pain however developed some lower abdominal discomfort early on during the hospitalization that improved during her stay.  -During the hospitalization patient stated was still having some bloody bowel movements however volume had decreased, per staff patient was noted to have some red streaks on her stool. -Hemoglobin stabilized at 9.1 by day of discharge. -Patient initially placed on clears diet advanced to full liquid diet and subsequently a soft diet which she tolerated. -GI was consulted and followed the patient throughout the hospitalization. -As hemoglobin had stabilized, decreased bloody bowel movements, patient cleared by GI for discharge with outpatient follow-up in 6 to 8 weeks. -Patient will be discharged in stable and improved condition.   2.  Hypertension -Controlled on home regimen of Norvasc, Toprol-XL.    3.  OSA on CPAP -CPAP nightly.   4.  CKD stage IIIb -Remained stable.   5.  Chronic pancreatic insufficiency -Patient maintained on home regimen pancreatic enzymes.   6.  Diabetes mellitus type 2 -Not on any medications prior to admission. -Hemoglobin A1c noted at 6.2 05/13/2015. -Repeat hemoglobin A1c 5.8. -Blood glucose levels remain controlled during the hospitalization.   7.  Hypomagnesemia -Magnesium at 1.4. -Patient given magnesium sulfate 4 g IV x1 during the hospitalization and repeat magnesium level at 2.3 by day of discharge. -Outpatient follow-up with PCP.    Procedures: None  Consultations: Gastroenterology: Dr. Michail Sermon 09/20/2022    Discharge Exam: Vitals:   09/22/22 2102 09/23/22 0531  BP: 130/61 137/60  Pulse: 65 66  Resp: 18 18  Temp: 98.4 F (36.9  C) 98.6 F (37 C)  SpO2: 100% 99%    General: NAD Cardiovascular: Regular rate and rhythm no murmurs rubs or gallops.  No JVD.  No lower extremity edema. Respiratory: Lungs clear to auscultation bilaterally.  No wheezes, no crackles, no rhonchi.  Fair air  movement.  Discharge Instructions   Discharge Instructions     Diet general   Complete by: As directed    Soft diet and then high-fiber diet starting in December.   Increase activity slowly   Complete by: As directed       Allergies as of 09/23/2022       Reactions   Morphine And Related Anaphylaxis   Lactose Intolerance (gi) Diarrhea, Nausea Only        Medication List     TAKE these medications    acetaminophen 650 MG CR tablet Commonly known as: TYLENOL Take 650 mg by mouth in the morning and at bedtime.   acetaminophen 500 MG tablet Commonly known as: TYLENOL Take 500 mg by mouth in the morning and at bedtime.   amLODipine 10 MG tablet Commonly known as: NORVASC Take 10 mg by mouth in the morning.   cyanocobalamin 1000 MCG tablet Commonly known as: VITAMIN B12 Take 1,000 mcg by mouth daily.   lipase/protease/amylase 36000 UNITS Cpep capsule Commonly known as: CREON Take 36,000 Units by mouth in the morning and at bedtime. Original Sig:  2 capsules (72000 units) 3 times daily with meals, 1 capsule (36000 units) with snacks   meloxicam 15 MG tablet Commonly known as: MOBIC Take 15 mg by mouth daily.   metoprolol succinate 50 MG 24 hr tablet Commonly known as: TOPROL-XL Take 50 mg by mouth in the morning.   pantoprazole 40 MG tablet Commonly known as: Protonix Take 1 tablet (40 mg total) by mouth daily.       Allergies  Allergen Reactions   Morphine And Related Anaphylaxis   Lactose Intolerance (Gi) Diarrhea and Nausea Only    Follow-up Information     [name], MD. Schedule an appointment as soon as possible for a visit in 2 week(s).   Specialty: Family Medicine Contact information: [place] [address] [phone]         Wilford Corner, MD. Schedule an appointment as soon as possible for a visit in 6 week(s).   Specialty: Gastroenterology Why: Follow-up in 6 to 8 weeks. Contact  information: [address] [place] [address] [phone]                  The results of significant diagnostics from this hospitalization (including imaging, microbiology, ancillary and laboratory) are listed below for reference.    Significant Diagnostic Studies: XR Cervical Spine 2 or 3 views  Result Date: 09/13/2022 AP lateral cervical spine shows multilevel disc space narrowing except at C2-3 with endplate spurring and some straightening of the cervical spine and uncovertebral changes. 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  XR Wrist Complete Left  Result Date: 09/13/2022 Three-view x-rays left wrist demonstrates mild subluxation and spurring first [facility] joint.  Scaphoid is normal.  Normal wrist joint and distal RU joint. Impression: Mild first CMC joint degenerative changes.  XR Shoulder Left  Result Date: 09/13/2022 3 views left shoulder obtained and reviewed this shows normal glenohumeral joint minimal AC degenerative changes. Impression: Left shoulder negative for acute changes.   Microbiology: Recent Results (from the past 240 hour(s))  Urine Culture  Status: Abnormal   Collection Time: 09/20/22  8:15 AM   Specimen: Urine, Clean Catch  Result Value Ref Range Status   Specimen Description   Final    URINE, CLEAN CATCH Performed at [facility], [place] [address], [place], [place] [address]    Special Requests   Final    NONE Performed at [facility], [place] [address], [place], [place] [address]    Culture (A)  Final    <10,000 COLONIES/mL INSIGNIFICANT GROWTH Performed at [place] [address] [place], [place] [address]    Report Status 09/21/2022 FINAL  Final     Labs: Basic Metabolic Panel: Recent Labs  Lab 09/20/22 1327 09/20/22 2357 09/21/22 0812 09/22/22 0319 09/23/22 0405  NA 144 139 145 141 141  K 4.7 5.2* 5.2* 4.2 4.4  CL 117* 116* 117* 117* 116*   CO2 20* 20* 21* 19* 21*  GLUCOSE 83 86 86 86 90  BUN '20 17 14 12 18  '$ CREATININE 1.26* 1.34* 1.14* 1.05* 1.23*  CALCIUM 9.2 8.8* 9.2 8.9 9.3  MG  --   --   --  1.4* 2.3   Liver Function Tests: Recent Labs  Lab 09/20/22 1327 09/20/22 2357  AST 17 16  ALT 13 12  ALKPHOS 61 61  BILITOT 0.5 0.7  PROT 7.1 7.1  ALBUMIN 3.8 3.6   No results for input(s): "LIPASE", "AMYLASE" in the last 168 hours. No results for input(s): "AMMONIA" in the last 168 hours. CBC: Recent Labs  Lab 09/20/22 0815 09/20/22 1209 09/21/22 0812 09/21/22 1512 09/22/22 0319 09/22/22 1353 09/23/22 0405  WBC 7.9   < > 6.1 7.1 6.0 7.0 5.8  NEUTROABS 5.6  --  4.3  --  3.5  --   --   HGB 12.0   < > 10.1* 10.0* 9.1* 11.2* 9.1*  HCT 37.0   < > 30.5* 30.1* 27.8* 34.8* 28.6*  MCV 93.7   < > 92.4 91.2 94.2 94.8 93.5  PLT 281   < > 214 221 217 256 225   < > = values in this interval not displayed.   Cardiac Enzymes: No results for input(s): "CKTOTAL", "CKMB", "CKMBINDEX", "TROPONINI" in the last 168 hours. BNP: BNP (last 3 results) No results for input(s): "BNP" in the last 8760 hours.  ProBNP (last 3 results) No results for input(s): "PROBNP" in the last 8760 hours.  CBG: No results for input(s): "GLUCAP" in the last 168 hours.     Signed:  Irine Seal MD.  [facility] 09/23/2022, 2:34 PM

## 2022-09-23 NOTE — Discharge Instructions (Addendum)
High fiber diet to start beginning of December.

## 2022-09-23 NOTE — Progress Notes (Signed)
[facility] Gastroenterology Progress Note  Stephanie Barber 81 y.o. [birth year]   Subjective: Bleeding subsiding. Denies abdominal pain. Feels good. Soft food tray was cold when she received it.  Objective: Vital signs: Vitals:   09/22/22 2102 09/23/22 0531  BP: 130/61 137/60  Pulse: 65 66  Resp: 18 18  Temp: 98.4 F (36.9 C) 98.6 F (37 C)  SpO2: 100% 99%    Physical Exam: Gen: alert, no acute distress, elderly, pleasant HEENT: anicteric sclera CV: RRR Chest: CTA B Abd: soft, nontender, nondistended, +BS Ext: no edema  Lab Results: Recent Labs    09/22/22 0319 09/23/22 0405  NA 141 141  K 4.2 4.4  CL 117* 116*  CO2 19* 21*  GLUCOSE 86 90  BUN 12 18  CREATININE 1.05* 1.23*  CALCIUM 8.9 9.3  MG 1.4* 2.3   Recent Labs    09/20/22 1327 09/20/22 2357  AST 17 16  ALT 13 12  ALKPHOS 61 61  BILITOT 0.5 0.7  PROT 7.1 7.1  ALBUMIN 3.8 3.6   Recent Labs    09/21/22 0812 09/21/22 1512 09/22/22 0319 09/22/22 1353 09/23/22 0405  WBC 6.1   < > 6.0 7.0 5.8  NEUTROABS 4.3  --  3.5  --   --   HGB 10.1*   < > 9.1* 11.2* 9.1*  HCT 30.5*   < > 27.8* 34.8* 28.6*  MCV 92.4   < > 94.2 94.8 93.5  PLT 214   < > 217 256 225   < > = values in this interval not displayed.      Assessment/Plan: Painless hematochezia - likely diverticular that is resolving. Hgb 9.1. Ok to go home today and f/u with me in 6-8 weeks. Start back on high fiber diet in December. D/W Dr. [place] Silos.   [name] 09/23/2022, 11:17 AM  Questions please call [phone] ID: Stephanie Barber, female   DOB: 1941/01/09, 81 y.o.   MRN: [id number]

## 2022-09-27 DIAGNOSIS — R350 Frequency of micturition: Secondary | ICD-10-CM | POA: Diagnosis not present

## 2022-09-28 ENCOUNTER — Emergency Department (HOSPITAL_COMMUNITY): Payer: Medicare Other

## 2022-09-28 ENCOUNTER — Emergency Department (HOSPITAL_COMMUNITY)
Admission: EM | Admit: 2022-09-28 | Discharge: 2022-09-28 | Disposition: A | Discharge status: Home / Self Care | Payer: Medicare Other | Attending: Emergency Medicine | Admitting: Emergency Medicine

## 2022-09-28 DIAGNOSIS — R001 Bradycardia, unspecified: Secondary | ICD-10-CM | POA: Diagnosis not present

## 2022-09-28 DIAGNOSIS — Z79899 Other long term (current) drug therapy: Secondary | ICD-10-CM | POA: Diagnosis not present

## 2022-09-28 DIAGNOSIS — I959 Hypotension, unspecified: Secondary | ICD-10-CM | POA: Diagnosis not present

## 2022-09-28 DIAGNOSIS — R197 Diarrhea, unspecified: Secondary | ICD-10-CM | POA: Diagnosis not present

## 2022-09-28 DIAGNOSIS — N183 Chronic kidney disease, stage 3 unspecified: Secondary | ICD-10-CM | POA: Diagnosis not present

## 2022-09-28 DIAGNOSIS — E119 Type 2 diabetes mellitus without complications: Secondary | ICD-10-CM | POA: Insufficient documentation

## 2022-09-28 DIAGNOSIS — R195 Other fecal abnormalities: Secondary | ICD-10-CM

## 2022-09-28 DIAGNOSIS — K921 Melena: Secondary | ICD-10-CM | POA: Insufficient documentation

## 2022-09-28 DIAGNOSIS — R531 Weakness: Secondary | ICD-10-CM | POA: Diagnosis not present

## 2022-09-28 DIAGNOSIS — R55 Syncope and collapse: Secondary | ICD-10-CM | POA: Diagnosis not present

## 2022-09-28 DIAGNOSIS — I129 Hypertensive chronic kidney disease with stage 1 through stage 4 chronic kidney disease, or unspecified chronic kidney disease: Secondary | ICD-10-CM | POA: Diagnosis not present

## 2022-09-28 DIAGNOSIS — R58 Hemorrhage, not elsewhere classified: Secondary | ICD-10-CM | POA: Diagnosis not present

## 2022-09-28 DIAGNOSIS — R112 Nausea with vomiting, unspecified: Secondary | ICD-10-CM | POA: Diagnosis not present

## 2022-09-28 DIAGNOSIS — R42 Dizziness and giddiness: Secondary | ICD-10-CM | POA: Diagnosis not present

## 2022-09-28 LAB — CBC WITH DIFFERENTIAL/PLATELET
Abs Immature Granulocytes: 0.02 10*3/uL (ref 0.00–0.07)
Basophils Absolute: 0 10*3/uL (ref 0.0–0.1)
Basophils Relative: 1 %
Eosinophils Absolute: 0.2 10*3/uL (ref 0.0–0.5)
Eosinophils Relative: 2 %
HCT: 27.6 % — ABNORMAL LOW (ref 36.0–46.0)
Hemoglobin: 9 g/dL — ABNORMAL LOW (ref 12.0–15.0)
Immature Granulocytes: 0 %
Lymphocytes Relative: 13 %
Lymphs Abs: 0.9 10*3/uL (ref 0.7–4.0)
MCH: 30.5 pg (ref 26.0–34.0)
MCHC: 32.6 g/dL (ref 30.0–36.0)
MCV: 93.6 fL (ref 80.0–100.0)
Monocytes Absolute: 0.4 10*3/uL (ref 0.1–1.0)
Monocytes Relative: 6 %
Neutro Abs: 5.2 10*3/uL (ref 1.7–7.7)
Neutrophils Relative %: 78 %
Platelets: 251 10*3/uL (ref 150–400)
RBC: 2.95 MIL/uL — ABNORMAL LOW (ref 3.87–5.11)
RDW: 13.4 % (ref 11.5–15.5)
WBC: 6.7 10*3/uL (ref 4.0–10.5)
nRBC: 0 % (ref 0.0–0.2)

## 2022-09-28 LAB — URINALYSIS, ROUTINE W REFLEX MICROSCOPIC
Bilirubin Urine: NEGATIVE
Glucose, UA: NEGATIVE mg/dL
Hgb urine dipstick: NEGATIVE
Ketones, ur: NEGATIVE mg/dL
Leukocytes,Ua: NEGATIVE
Nitrite: NEGATIVE
Protein, ur: NEGATIVE mg/dL
Specific Gravity, Urine: 1.013 (ref 1.005–1.030)
pH: 5 (ref 5.0–8.0)

## 2022-09-28 LAB — TYPE AND SCREEN
ABO/RH(D): O POS
Antibody Screen: NEGATIVE

## 2022-09-28 LAB — COMPREHENSIVE METABOLIC PANEL
ALT: 17 U/L (ref 0–44)
AST: 22 U/L (ref 15–41)
Albumin: 3.9 g/dL (ref 3.5–5.0)
Alkaline Phosphatase: 60 U/L (ref 38–126)
Anion gap: 6 (ref 5–15)
BUN: 27 mg/dL — ABNORMAL HIGH (ref 8–23)
CO2: 19 mmol/L — ABNORMAL LOW (ref 22–32)
Calcium: 9.2 mg/dL (ref 8.9–10.3)
Chloride: 116 mmol/L — ABNORMAL HIGH (ref 98–111)
Creatinine, Ser: 1.75 mg/dL — ABNORMAL HIGH (ref 0.44–1.00)
GFR, Estimated: 29 mL/min — ABNORMAL LOW (ref >60)
Glucose, Bld: 128 mg/dL — ABNORMAL HIGH (ref 70–99)
Potassium: 4.2 mmol/L (ref 3.5–5.1)
Sodium: 141 mmol/L (ref 135–145)
Total Bilirubin: 0.5 mg/dL (ref 0.3–1.2)
Total Protein: 6.8 g/dL (ref 6.5–8.1)

## 2022-09-28 LAB — POC OCCULT BLOOD, ED: Fecal Occult Bld: NEGATIVE

## 2022-09-28 LAB — CBG MONITORING, ED: Glucose-Capillary: 102 mg/dL — ABNORMAL HIGH (ref 70–99)

## 2022-09-28 LAB — HEMOGLOBIN AND HEMATOCRIT, BLOOD
HCT: 28.5 % — ABNORMAL LOW (ref 36.0–46.0)
Hemoglobin: 9.3 g/dL — ABNORMAL LOW (ref 12.0–15.0)

## 2022-09-28 LAB — PROTIME-INR
INR: 1.1 (ref 0.8–1.2)
Prothrombin Time: 13.9 seconds (ref 11.4–15.2)

## 2022-09-28 MED ORDER — SODIUM CHLORIDE 0.9 % IV BOLUS
500.0000 mL | Freq: Once | INTRAVENOUS | Status: AC
  Administered 2022-09-28: 500 mL via INTRAVENOUS

## 2022-09-28 NOTE — ED Provider Notes (Signed)
[facility] DEPT Provider Note   CSN: [id number] Arrival date & time: 09/28/22  7793     History Medical history of hypertension, stage III CKD, hyperlipidemia, diabetes, OSA, history of GI bleed Chief Complaint  Patient presents with   GI Bleeding    Stephanie Barber is a 81 y.o. female.  Patient presents with complaints of dark stools.  She was recently admitted on November 8 through 11 for suspected lower GI bleed suspected diverticular etiology.  She had originally presented with bright red rectal bleeding.  Hemoglobin stabilized at 9.1 by day of discharge and she did not require any blood transfusions.  She did not require any endoscopy/colonoscopy during her admission. She was schedule to have repeat CBC at GI follow up appointment which she has not had yet.   Per husband, yesterday she was seen by Urology for increased urinating at night with incontinence. She was prescribed some medication, but husband and patient are unsure what this was. Per patient, last night she woke up and was feeling nauseas. She started having cramps in her lower legs. When she stood up to walk to the kitchen she started feeling clammy and diaphoretic and felt like she thought she was going to pass out. She went to the bathroom and had diarrhea and dark tarry stools. She states her stools have never looked like this before. She is on iron supplements, but she does not think that has ever made her stool look like this. She has not had any pepto bismol. She still remains feeling generally weak even at rest.  She denies any abdominal pain, vomiting, hematemesis, chest pain, shortness of breath, hematuria, dysuria, flank pain.    HPI     Home Medications Prior to Admission medications   Medication Sig Start Date End Date Taking? Authorizing Provider  acetaminophen (TYLENOL) 500 MG tablet Take 500 mg by mouth in the morning and at bedtime.    [provider]   acetaminophen (TYLENOL) 650 MG CR tablet Take 650 mg by mouth in the morning and at bedtime.    [provider]  amLODipine (NORVASC) 10 MG tablet Take 10 mg by mouth in the morning.    [provider]  cyanocobalamin (VITAMIN B12) 1000 MCG tablet Take 1,000 mcg by mouth daily.    [provider]  lipase/protease/amylase (CREON) 36000 UNITS CPEP capsule Take 36,000 Units by mouth in the morning and at bedtime. Original Sig:  2 capsules (72000 units) 3 times daily with meals, 1 capsule (36000 units) with snacks    [provider]  meloxicam (MOBIC) 15 MG tablet Take 15 mg by mouth daily. 09/04/22   [provider]  metoprolol succinate (TOPROL-XL) 50 MG 24 hr tablet Take 50 mg by mouth in the morning.    [provider]  pantoprazole (PROTONIX) 40 MG tablet Take 1 tablet (40 mg total) by mouth daily. 07/29/22 07/29/23  Barb Merino, MD      Allergies    Morphine and related and Lactose intolerance (gi)    Review of Systems   Review of Systems  Constitutional:  Positive for diaphoresis.  Cardiovascular:        Pre syncope  Gastrointestinal:  Positive for diarrhea and nausea.       Dark tarry stools  Musculoskeletal:        Leg cramps    Physical Exam Updated Vital Signs BP (!) 126/57   Pulse 66   Temp 98.1 F (36.7 C) (Oral)  Resp 17   SpO2 100%  Physical Exam Vitals and nursing note reviewed.  Constitutional:      General: She is not in acute distress.    Appearance: Normal appearance. She is not ill-appearing, toxic-appearing or diaphoretic.  HENT:     Head: Normocephalic and atraumatic.     Nose: No nasal deformity.     Mouth/Throat:     Lips: Pink. No lesions.     Mouth: Mucous membranes are moist. No injury, lacerations, oral lesions or angioedema.     Pharynx: Oropharynx is clear. Uvula midline. No pharyngeal swelling, oropharyngeal exudate, posterior oropharyngeal erythema or uvula swelling.  Eyes:     General:  Gaze aligned appropriately. No scleral icterus.       Right eye: No discharge.        Left eye: No discharge.     Conjunctiva/sclera: Conjunctivae normal.     Right eye: Right conjunctiva is not injected. No exudate or hemorrhage.    Left eye: Left conjunctiva is not injected. No exudate or hemorrhage. Cardiovascular:     Rate and Rhythm: Normal rate and regular rhythm.     Pulses: Normal pulses.          Radial pulses are 2+ on the right side and 2+ on the left side.       Dorsalis pedis pulses are 2+ on the right side and 2+ on the left side.     Heart sounds: Normal heart sounds, S1 normal and S2 normal. Heart sounds not distant. No murmur heard.    No friction rub. No gallop. No S3 or S4 sounds.  Pulmonary:     Effort: Pulmonary effort is normal. No accessory muscle usage or respiratory distress.     Breath sounds: Normal breath sounds. No stridor. No wheezing, rhonchi or rales.  Chest:     Chest wall: No tenderness.  Abdominal:     General: Abdomen is flat. There is no distension.     Palpations: Abdomen is soft. There is no mass or pulsatile mass.     Tenderness: There is no abdominal tenderness. There is no right CVA tenderness, left CVA tenderness, guarding or rebound.  Musculoskeletal:     Right lower leg: No edema.     Left lower leg: No edema.  Skin:    General: Skin is warm and dry.     Coloration: Skin is not jaundiced or pale.     Findings: No bruising, erythema, lesion or rash.  Neurological:     General: No focal deficit present.     Mental Status: She is alert and oriented to person, place, and time.     GCS: GCS eye subscore is 4. GCS verbal subscore is 5. GCS motor subscore is 6.  Psychiatric:        Mood and Affect: Mood normal.        Behavior: Behavior normal. Behavior is cooperative.     ED Results / Procedures / Treatments   Labs (all labs ordered are listed, but only abnormal results are displayed) Labs Reviewed  COMPREHENSIVE METABOLIC PANEL -  Abnormal; Notable for the following components:      Result Value   Chloride 116 (*)    CO2 19 (*)    Glucose, Bld 128 (*)    BUN 27 (*)    Creatinine, Ser 1.75 (*)    GFR, Estimated 29 (*)    All other components within normal limits  CBC WITH DIFFERENTIAL/PLATELET - Abnormal; Notable for  the following components:   RBC 2.95 (*)    Hemoglobin 9.0 (*)    HCT 27.6 (*)    All other components within normal limits  URINALYSIS, ROUTINE W REFLEX MICROSCOPIC - Abnormal; Notable for the following components:   Color, Urine STRAW (*)    All other components within normal limits  HEMOGLOBIN AND HEMATOCRIT, BLOOD - Abnormal; Notable for the following components:   Hemoglobin 9.3 (*)    HCT 28.5 (*)    All other components within normal limits  CBG MONITORING, ED - Abnormal; Notable for the following components:   Glucose-Capillary 102 (*)    All other components within normal limits  PROTIME-INR  POC OCCULT BLOOD, ED  TYPE AND SCREEN    EKG None  Radiology DG Chest Portable 1 View  Result Date: 09/28/2022 CLINICAL DATA:  Near syncope EXAM: PORTABLE CHEST 1 VIEW COMPARISON:  05/06/2017 FINDINGS: Artifact from EKG leads. Normal heart size and mediastinal contours. No acute infiltrate or edema. No effusion or pneumothorax. No acute osseous findings. IMPRESSION: No active disease. Electronically Signed   By: Jorje Guild M.D.   On: 09/28/2022 07:32    Procedures Procedures  This patient was on telemetry or cardiac monitoring during their time in the ED.    Medications Ordered in ED Medications  sodium chloride 0.9 % bolus 500 mL (500 mLs Intravenous New Bag/Given 09/28/22 0902)    ED Course/ Medical Decision Making/ A&P Clinical Course as of 09/28/22 1327  Thu Sep 28, 2022  0727 Hgb 9.1 < 9.0 over the past 5 days.  [GL]  [id number] Rectal exam with dark stools [GL]  0811 Hem negative stool [GL]  0833 Mild AKI noted on labs. Will give 500 cc IVF. [GL]  0840 New medication is  called Mirabegron for overactive bladder. She is supposed to take this 50 mg daily. First dose was last night. Reviewed medication on Mirabegron. She should be on dose reduction with new renal impairment. It also can cause diarrhea. No report in change in stool color. [GL]  0908 Plan to repeat hgb/hct 4 hours after first. Will monitor for BMs as well and possible recollect hem occult. [GL]  9983 I reassessed patient and she has had no further episodes of dark stools since being here.  [GL]    Clinical Course User Index [GL] Sherre Poot Adora Fridge, PA-C                           Medical Decision Making Amount and/or Complexity of Data Reviewed Labs: ordered. Radiology: ordered.    MDM  This is a 81 y.o. female who presents to the ED with presyncope, diarrhea, and dark stools The differential of this patient includes but is not limited to GI bleed, diarrhea illness, anemia, hypovolemia, orthostatic syncope, vasovagal, etc.   Initial Impression  Well appearing at rest. Vitals are HDS, afebrile, O2 99% on RA Exam overall unremarkable  I personally ordered, reviewed, and interpreted all laboratory work and imaging and agree with radiologist interpretation. Results interpreted below:  CBC reveals hgb of 9.0, most recent 5 days ago was 9.1. repeat hgb 4 hours later is 9.3. No leukocytosis CMP reveals mild AKI with Creat of 1.75 and BUN of 27. CO2 19 (chronic), electrolytes are okay. Liver function normal.  PT-INR normal Hem-Occult negative UA without any LE or nitrites  CXR without acute infiltrate or other abnormality EKG NSR, nonischemic, no arrhythmia, Qtc normal.  Assessment/Plan:  Initially,  I was concerned about a GI bleed given history of lower GI bleeds, however with negative hemoccult and stable hgb over several hours of being here as well as no repeat dark stools while here, this is less likely.  She had no signs of infection such as UTI, PNA, or elevated WBC.  EKG is  nonischemic and without any chest pain equivalent, low suspicion for ACS.  She does have a mild AKI likely prerenal due to diarrhea today and possible the Mirabegron that she started yesterday. She received 500 cc of fluids with some improvement in symptoms. I reviewed side effect profile and this medication has been known to cause diarrhea, renal dysfunction, and dizziness. Patient is concerned that the medication is causing her symptoms, so I have recommended to stop taking this to see if symptoms improve. She is told to follow up with urology regarding this. She has been able to ambulate here without significant ambulatory dysfunction. She has tolerated PO here. No admission needs at this time, but I have provided patient with very strict return precautions with worsening symptoms.     Charting Requirements Additional history is obtained from:  Independent historian External Records from outside source obtained and reviewed including: Recent admission notes from prior GI bleed, recent hgb value Social Determinants of Health:  none Pertinant PMH that complicates patient's illness: hx of GI bleeds, Stage III CKD  Patient Care Problems that were addressed during this visit: - Dark stools: Acute illness with complication - Generalized Weakness: Acute illness with systemic symptoms This patient was maintained on a cardiac monitor/telemetry. I personally viewed and interpreted the cardiac monitor which reveals an underlying rhythm of NSR Medications given in ED: 500 cc IVF Reevaluation of the patient after these medicines showed that the patient improved I have reviewed home medications and made changes accordingly.  Critical Care Interventions: n/a Consultations: n/a Disposition: discharge  This is a supervised visit with my attending physician, Dr. Billy Fischer. We have discussed this patient and they have altered the plan as needed.  Portions of this note were generated with Theatre manager. Dictation errors may occur despite best attempts at proofreading.    Final Clinical Impression(s) / ED Diagnoses Final diagnoses:  Dark stools  Generalized weakness    Rx / DC Orders ED Discharge Orders     None         Adolphus Birchwood, PA-C 09/28/22 1327    Gareth Morgan, MD 09/29/22 2312

## 2022-09-28 NOTE — Discharge Instructions (Addendum)
You were seen in the ED today for weakness and dark stools. Your hemoglobin level was checked twice here and has remained stable. Your stool tested negative for blood as well. This is unlikely to be another GI bleed, but if you go home and start noticing frank blood in your stools, please return here.   I recommend discontinuing Mirabegron that you started last night. If this is is what is causing your symptoms, you should start noticing improvement by tomorrow afternoon. Please contact your urologist for alternative medication options if needed.  Please call and schedule a follow up appointment with your PCP.  If you start experiencing worsening symptoms such as inability to tolerate fluids, inability to get around your house safely due to severe weakness, or any fevers.

## 2022-09-28 NOTE — ED Triage Notes (Signed)
Patient arrived with complaints of black tarry stools, NV and weakness over the last three days. Recently admitted for a GI bleed.

## 2022-09-29 DIAGNOSIS — K922 Gastrointestinal hemorrhage, unspecified: Secondary | ICD-10-CM | POA: Diagnosis not present

## 2022-10-02 DIAGNOSIS — N1832 Chronic kidney disease, stage 3b: Secondary | ICD-10-CM | POA: Diagnosis not present

## 2022-10-02 DIAGNOSIS — D509 Iron deficiency anemia, unspecified: Secondary | ICD-10-CM | POA: Diagnosis not present

## 2022-10-02 DIAGNOSIS — K922 Gastrointestinal hemorrhage, unspecified: Secondary | ICD-10-CM | POA: Diagnosis not present

## 2022-10-02 DIAGNOSIS — K921 Melena: Secondary | ICD-10-CM | POA: Diagnosis not present

## 2022-10-11 ENCOUNTER — Ambulatory Visit (INDEPENDENT_AMBULATORY_CARE_PROVIDER_SITE_OTHER): Payer: Medicare Other | Admitting: Orthopaedic Surgery

## 2022-10-11 ENCOUNTER — Encounter: Payer: Self-pay | Admitting: Orthopaedic Surgery

## 2022-10-11 VITALS — BP 141/73 | HR 64 | Ht 59.0 in | Wt 114.0 lb

## 2022-10-11 DIAGNOSIS — M4722 Other spondylosis with radiculopathy, cervical region: Secondary | ICD-10-CM

## 2022-10-11 DIAGNOSIS — M542 Cervicalgia: Secondary | ICD-10-CM | POA: Diagnosis not present

## 2022-10-11 NOTE — Progress Notes (Unsigned)
Office Visit Note   Patient: Stephanie Barber           Date of Birth: Nov 06, 1941           MRN: [id number] Visit Date: 10/11/2022              Requested by: [name], MD [address],  [place] [id number] PCP: [name], MD   Assessment & Plan: Visit Diagnoses: No diagnosis found.  Plan: ***  Follow-Up Instructions: No follow-ups on file.   Orders:  No orders of the defined types were placed in this encounter.  No orders of the defined types were placed in this encounter.     Procedures: No procedures performed   Clinical Data: No additional findings.   Subjective: Chief Complaint  Patient presents with   Left Shoulder - Follow-up   Left Hand - Follow-up   Neck - Follow-up    HPI  Review of Systems   Objective: Vital Signs: BP (!) 141/73   Pulse 64   Ht '4\' 11"'$  (1.499 m)   Wt 113 lb 15.7 oz (51.7 kg)   BMI 23.02 kg/m   Physical Exam  Ortho Exam  Specialty Comments:  No specialty comments available.  Imaging: No results found.   PMFS History: Patient Active Problem List   Diagnosis Date Noted   GIB (gastrointestinal bleeding) 09/20/2022   Pancreatic insufficiency 09/20/2022   GI bleeding 07/23/2022   Stage 3b chronic kidney disease (CKD) (HCC)    Other spondylosis with radiculopathy, lumbar region 09/19/2021   IDA (iron deficiency anemia) 07/06/2021   Protein-calorie malnutrition, severe 06/21/2021   ABLA (acute blood loss anemia)    Malnutrition of moderate degree 06/16/2021   Hematochezia 06/15/2021   Acute GI bleeding 06/15/2021   Hyperlipidemia associated with type 2 diabetes mellitus ([place]) 06/15/2021   Left-sided headache 06/30/2019   Memory loss 06/30/2019   AKI (acute kidney injury) ([place]) 05/06/2017   Dehydration 05/06/2017   Chest pain 05/06/2017   Wound infection after surgery 01/30/2017   Palpitations 08/24/2016   Noncompliance with CPAP treatment 12/28/2015   Poor compliance with CPAP  treatment 10/26/2014   CPAP use counseling 07/28/2014   OSA on CPAP 07/28/2014   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   Hypersomnolent 04/02/2014   Obstructive sleep apnea 04/02/2014   Non-compliance with treatment 03/05/2014   Type 2 diabetes mellitus ([facility]) 12/05/2011   Hypertension 12/05/2011   GERD (gastroesophageal reflux disease)    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   Chronic cough 07/26/2011   Past Medical History:  Diagnosis Date   Arthritis    Chronic headache    Complication of anesthesia    allergy to Novocaine    Diabetes mellitus    GERD (gastroesophageal reflux disease)    GI bleed    Heart murmur    High cholesterol    Hyperlipidemia    Hypertension    Intracranial atherosclerosis    per MRA   Migraine    Narcolepsy and cataplexy    OSA on CPAP    states has not used CPAP in over a year   Rectal prolapse    Stage 3b chronic kidney disease (CKD) ([place])    Trigger finger of all digits of right hand     Family History  Problem Relation Age of Onset   Heart disease Mother    Throat cancer Father    Cancer Father        stomach   Kidney cancer  Brother    Heart disease Brother     Past Surgical History:  Procedure Laterality Date   ABDOMINAL HYSTERECTOMY     Fibroids   BACK SURGERY     L4,L5 discectomy   CARDIAC CATHETERIZATION     CARPAL TUNNEL RELEASE Right 01/18/2017   COLONOSCOPY N/A 07/28/2022   Procedure: COLONOSCOPY;  Surgeon: Clarene Essex, MD;  Location: [facility];  Service: Gastroenterology;  Laterality: N/A;   COLONOSCOPY WITH PROPOFOL N/A 06/17/2021   Procedure: COLONOSCOPY WITH PROPOFOL;  Surgeon: Wilford Corner, MD;  Location: [facility];  Service: Endoscopy;  Laterality: N/A;   ESOPHAGOGASTRODUODENOSCOPY (EGD) WITH PROPOFOL N/A 06/17/2021   Procedure: ESOPHAGOGASTRODUODENOSCOPY (EGD) WITH PROPOFOL;  Surgeon: Wilford Corner, MD;  Location: [facility];  Service: Endoscopy;  Laterality: N/A;   KIDNEY SURGERY  1998    Right.  growth removed   POLYPECTOMY  06/17/2021   Procedure: POLYPECTOMY;  Surgeon: Wilford Corner, MD;  Location: [facility];  Service: Endoscopy;;   POLYPECTOMY  07/28/2022   Procedure: POLYPECTOMY;  Surgeon: Clarene Essex, MD;  Location: [facility];  Service: Gastroenterology;;   rectal prolapse repair  12/05/2011   ROTATOR CUFF REPAIR     Left   SHOULDER ARTHROSCOPY WITH SUBACROMIAL DECOMPRESSION, ROTATOR CUFF REPAIR AND BICEP TENDON REPAIR Left 05/25/2015   Procedure: SHOULDER ARTHROSCOPY WITH SUBACROMIAL DECOMPRESSION, ROTATOR CUFF REPAIR AND BICEP TENODESIS, DEBRIDEMENT. ;  Surgeon: Meredith Pel, MD;  Location: [facility];  Service: Orthopedics;  Laterality: Left;  LEFT SHOULDER ROTATOR CUFF TEAR REPAIR, ARTHROSCOPY, DEBRIDEMENT, BICEPS TENODESIS, SUBACROMIAL DECOMPRESSION.   TOTAL ABDOMINAL HYSTERECTOMY     TRIGGER FINGER RELEASE Right 04/30/2018   Procedure: RELEASE TRIGGER FINGER/A-1 PULLEY RIGHT MIDDLE  AND SMALLn ;  Surgeon: Daryll Brod, MD;  Location: [facility];  Service: Orthopedics;  Laterality: Right;  FAB, Bier block   TUBAL LIGATION     Social History   Occupational History   Occupation: retired. prev worked at [employer].  Tobacco Use   Smoking status: Former    Types: Cigarettes    Quit date: 11/13/1980    Years since quitting: 41.9   Smokeless tobacco: Never   Tobacco comments:    quit in 1982  Vaping Use   Vaping Use: Never used  Substance and Sexual Activity   Alcohol use: No    Alcohol/week: 0.0 standard drinks of alcohol   Drug use: No   Sexual activity: Yes

## 2022-10-12 DIAGNOSIS — M4722 Other spondylosis with radiculopathy, cervical region: Secondary | ICD-10-CM | POA: Insufficient documentation

## 2022-10-16 DIAGNOSIS — M25512 Pain in left shoulder: Secondary | ICD-10-CM | POA: Diagnosis not present

## 2022-10-16 DIAGNOSIS — I10 Essential (primary) hypertension: Secondary | ICD-10-CM | POA: Diagnosis not present

## 2022-10-16 DIAGNOSIS — D509 Iron deficiency anemia, unspecified: Secondary | ICD-10-CM | POA: Diagnosis not present

## 2022-10-16 DIAGNOSIS — E7800 Pure hypercholesterolemia, unspecified: Secondary | ICD-10-CM | POA: Diagnosis not present

## 2022-10-16 DIAGNOSIS — E46 Unspecified protein-calorie malnutrition: Secondary | ICD-10-CM | POA: Diagnosis not present

## 2022-10-16 DIAGNOSIS — R252 Cramp and spasm: Secondary | ICD-10-CM | POA: Diagnosis not present

## 2022-10-16 DIAGNOSIS — E1149 Type 2 diabetes mellitus with other diabetic neurological complication: Secondary | ICD-10-CM | POA: Diagnosis not present

## 2022-10-23 ENCOUNTER — Telehealth: Payer: Self-pay | Admitting: Hematology and Oncology

## 2022-10-23 ENCOUNTER — Telehealth: Payer: Self-pay | Admitting: Orthopaedic Surgery

## 2022-10-23 NOTE — Telephone Encounter (Signed)
Called patient to r/s January appointment due to provider PAL. Patient notified.

## 2022-10-23 NOTE — Telephone Encounter (Signed)
Patient is having pain and problems with her left shoulder and states that dr. [name] was going to get surgery scheduled . Please advise..[phone]

## 2022-10-26 DIAGNOSIS — N1832 Chronic kidney disease, stage 3b: Secondary | ICD-10-CM | POA: Diagnosis not present

## 2022-10-26 DIAGNOSIS — E1149 Type 2 diabetes mellitus with other diabetic neurological complication: Secondary | ICD-10-CM | POA: Diagnosis not present

## 2022-10-26 DIAGNOSIS — D500 Iron deficiency anemia secondary to blood loss (chronic): Secondary | ICD-10-CM | POA: Diagnosis not present

## 2022-10-26 DIAGNOSIS — E7800 Pure hypercholesterolemia, unspecified: Secondary | ICD-10-CM | POA: Diagnosis not present

## 2022-10-31 ENCOUNTER — Encounter: Payer: Self-pay | Admitting: Orthopaedic Surgery

## 2022-10-31 ENCOUNTER — Ambulatory Visit (INDEPENDENT_AMBULATORY_CARE_PROVIDER_SITE_OTHER): Payer: Medicare Other | Admitting: Orthopaedic Surgery

## 2022-10-31 VITALS — BP 133/81 | HR 80 | Ht 59.0 in | Wt 113.0 lb

## 2022-10-31 DIAGNOSIS — Z8719 Personal history of other diseases of the digestive system: Secondary | ICD-10-CM | POA: Diagnosis not present

## 2022-10-31 DIAGNOSIS — D500 Iron deficiency anemia secondary to blood loss (chronic): Secondary | ICD-10-CM | POA: Diagnosis not present

## 2022-10-31 DIAGNOSIS — D509 Iron deficiency anemia, unspecified: Secondary | ICD-10-CM | POA: Diagnosis not present

## 2022-10-31 DIAGNOSIS — M4722 Other spondylosis with radiculopathy, cervical region: Secondary | ICD-10-CM

## 2022-10-31 DIAGNOSIS — M7542 Impingement syndrome of left shoulder: Secondary | ICD-10-CM | POA: Diagnosis not present

## 2022-10-31 DIAGNOSIS — E538 Deficiency of other specified B group vitamins: Secondary | ICD-10-CM | POA: Diagnosis not present

## 2022-10-31 MED ORDER — BUPIVACAINE HCL 0.25 % IJ SOLN
4.0000 mL | INTRAMUSCULAR | Status: AC | PRN
  Administered 2022-10-31: 4 mL via INTRA_ARTICULAR

## 2022-10-31 MED ORDER — LIDOCAINE HCL 1 % IJ SOLN
0.5000 mL | INTRAMUSCULAR | Status: AC | PRN
  Administered 2022-10-31: .5 mL

## 2022-10-31 MED ORDER — METHYLPREDNISOLONE ACETATE 40 MG/ML IJ SUSP
40.0000 mg | INTRAMUSCULAR | Status: AC | PRN
  Administered 2022-10-31: 40 mg via INTRA_ARTICULAR

## 2022-10-31 NOTE — Progress Notes (Signed)
Office Visit Note   Patient: Stephanie Barber           Date of Birth: Sep 09, 1941           MRN: [id number] Visit Date: 10/31/2022              Requested by: [name], MD [address],  [place] [id number] PCP: [name], MD   Assessment & Plan: Visit Diagnoses:  1. Other spondylosis with radiculopathy, cervical region     Plan: She can follow-up after cervical MRI as planned.  MRIs to evaluate her for cervical stenosis with her bilateral lower extremity weakness and gait problems.  Follow-Up Instructions: No follow-ups on file.   Orders:  No orders of the defined types were placed in this encounter.  No orders of the defined types were placed in this encounter.     Procedures: Large Joint Inj: L subacromial bursa on 10/31/2022 3:47 PM Indications: pain Details: 22 G 1.5 in needle  Arthrogram: No  Medications: 4 mL bupivacaine 0.25 %; 40 mg methylPREDNISolone acetate 40 MG/ML; 0.5 mL lidocaine 1 % Outcome: tolerated well, no immediate complications Procedure, treatment alternatives, risks and benefits explained, specific risks discussed. Consent was given by the patient. Immediately prior to procedure a time out was called to verify the correct patient, procedure, equipment, support staff and site/side marked as required. Patient was prepped and draped in the usual sterile fashion.       Clinical Data: No additional findings.   Subjective: Chief Complaint  Patient presents with   Left Shoulder - Pain    HPI 81 year old female continued to have problems with shoulder pain with range of motion.  Previous subacromial injection 5 weeks ago gave her good relief but she has had recurrent symptoms last week and came back in for repeat injection.  She has weakness in her legs having use a walker for ambulation.  She has an MRI scheduled for cervical spine 10/30/2022.  Review of Systems updated unchanged   Objective: Vital Signs: BP  133/81   Pulse 80   Ht '4\' 11"'$  (1.499 m)   Wt 113 lb (51.3 kg)   BMI 22.82 kg/m   Physical Exam Constitutional:      Appearance: She is well-developed.  HENT:     Head: Normocephalic.     Right Ear: External ear normal.     Left Ear: External ear normal. There is no impacted cerumen.  Eyes:     Pupils: Pupils are equal, round, and reactive to light.  Neck:     Thyroid: No thyromegaly.     Trachea: No tracheal deviation.  Cardiovascular:     Rate and Rhythm: Normal rate.  Pulmonary:     Effort: Pulmonary effort is normal.  Abdominal:     Palpations: Abdomen is soft.  Musculoskeletal:     Cervical back: No rigidity.  Skin:    General: Skin is warm and dry.  Neurological:     Mental Status: She is alert and oriented to person, place, and time.  Psychiatric:        Behavior: Behavior normal.     Ortho Exam bilateral lower extremity weakness no clonus.  Weakness is symmetrical with quads anterior tib gastrocsoleus hip flexors.  Positive impingement left shoulder.  Specialty Comments:  No specialty comments available.  Imaging: No results found.   PMFS History: Patient Active Problem List   Diagnosis Date Noted   Other spondylosis with radiculopathy, cervical region  10/12/2022   GIB (gastrointestinal bleeding) 09/20/2022   Pancreatic insufficiency 09/20/2022   GI bleeding 07/23/2022   Stage 3b chronic kidney disease (CKD) ([place])    Other spondylosis with radiculopathy, lumbar region 09/19/2021   IDA (iron deficiency anemia) 07/06/2021   Protein-calorie malnutrition, severe 06/21/2021   ABLA (acute blood loss anemia)    Malnutrition of moderate degree 06/16/2021   Hematochezia 06/15/2021   Acute GI bleeding 06/15/2021   Hyperlipidemia associated with type 2 diabetes mellitus ([place]) 06/15/2021   Left-sided headache 06/30/2019   Memory loss 06/30/2019   AKI (acute kidney injury) ([place]) 05/06/2017   Dehydration 05/06/2017   Chest pain 05/06/2017   Wound infection  after surgery 01/30/2017   Palpitations 08/24/2016   Noncompliance with CPAP treatment 12/28/2015   Poor compliance with CPAP treatment 10/26/2014   CPAP use counseling 07/28/2014   OSA on CPAP 07/28/2014   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   Hypersomnolent 04/02/2014   Obstructive sleep apnea 04/02/2014   Non-compliance with treatment 03/05/2014   Type 2 diabetes mellitus ([place]) 12/05/2011   Hypertension 12/05/2011   GERD (gastroesophageal reflux disease)    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   Chronic cough 07/26/2011   Past Medical History:  Diagnosis Date   Arthritis    Chronic headache    Complication of anesthesia    allergy to Novocaine    Diabetes mellitus    GERD (gastroesophageal reflux disease)    GI bleed    Heart murmur    High cholesterol    Hyperlipidemia    Hypertension    Intracranial atherosclerosis    per MRA   Migraine    Narcolepsy and cataplexy    OSA on CPAP    states has not used CPAP in over a year   Rectal prolapse    Stage 3b chronic kidney disease (CKD) ([place])    Trigger finger of all digits of right hand     Family History  Problem Relation Age of Onset   Heart disease Mother    Throat cancer Father    Cancer Father        stomach   Kidney cancer Brother    Heart disease Brother     Past Surgical History:  Procedure Laterality Date   ABDOMINAL HYSTERECTOMY     Fibroids   BACK SURGERY     L4,L5 discectomy   CARDIAC CATHETERIZATION     CARPAL TUNNEL RELEASE Right 01/18/2017   COLONOSCOPY N/A 07/28/2022   Procedure: COLONOSCOPY;  Surgeon: Clarene Essex, MD;  Location: [facility];  Service: Gastroenterology;  Laterality: N/A;   COLONOSCOPY WITH PROPOFOL N/A 06/17/2021   Procedure: COLONOSCOPY WITH PROPOFOL;  Surgeon: Wilford Corner, MD;  Location: [facility];  Service: Endoscopy;  Laterality: N/A;   ESOPHAGOGASTRODUODENOSCOPY (EGD) WITH PROPOFOL N/A 06/17/2021   Procedure: ESOPHAGOGASTRODUODENOSCOPY (EGD) WITH  PROPOFOL;  Surgeon: Wilford Corner, MD;  Location: [facility];  Service: Endoscopy;  Laterality: N/A;   KIDNEY SURGERY  1998   Right.  growth removed   POLYPECTOMY  06/17/2021   Procedure: POLYPECTOMY;  Surgeon: Wilford Corner, MD;  Location: [facility];  Service: Endoscopy;;   POLYPECTOMY  07/28/2022   Procedure: POLYPECTOMY;  Surgeon: Clarene Essex, MD;  Location: [facility];  Service: Gastroenterology;;   rectal prolapse repair  12/05/2011   ROTATOR CUFF REPAIR     Left   SHOULDER ARTHROSCOPY WITH SUBACROMIAL DECOMPRESSION, ROTATOR CUFF REPAIR AND BICEP TENDON REPAIR Left 05/25/2015   Procedure: SHOULDER ARTHROSCOPY WITH SUBACROMIAL DECOMPRESSION, ROTATOR CUFF REPAIR AND  BICEP TENODESIS, DEBRIDEMENT. ;  Surgeon: Meredith Pel, MD;  Location: [facility];  Service: Orthopedics;  Laterality: Left;  LEFT SHOULDER ROTATOR CUFF TEAR REPAIR, ARTHROSCOPY, DEBRIDEMENT, BICEPS TENODESIS, SUBACROMIAL DECOMPRESSION.   TOTAL ABDOMINAL HYSTERECTOMY     TRIGGER FINGER RELEASE Right 04/30/2018   Procedure: RELEASE TRIGGER FINGER/A-1 PULLEY RIGHT MIDDLE  AND SMALLn ;  Surgeon: Daryll Brod, MD;  Location: [facility];  Service: Orthopedics;  Laterality: Right;  FAB, Bier block   TUBAL LIGATION     Social History   Occupational History   Occupation: retired. prev worked at [employer].  Tobacco Use   Smoking status: Former    Types: Cigarettes    Quit date: 11/13/1980    Years since quitting: 41.9   Smokeless tobacco: Never   Tobacco comments:    quit in 1982  Vaping Use   Vaping Use: Never used  Substance and Sexual Activity   Alcohol use: No    Alcohol/week: 0.0 standard drinks of alcohol   Drug use: No   Sexual activity: Yes

## 2022-11-08 DIAGNOSIS — R350 Frequency of micturition: Secondary | ICD-10-CM | POA: Diagnosis not present

## 2022-11-09 ENCOUNTER — Ambulatory Visit
Admission: RE | Admit: 2022-11-09 | Discharge: 2022-11-09 | Disposition: A | Discharge status: Home / Self Care | Payer: Medicare Other | Source: Ambulatory Visit | Attending: Orthopaedic Surgery | Admitting: Orthopaedic Surgery

## 2022-11-09 DIAGNOSIS — M542 Cervicalgia: Secondary | ICD-10-CM | POA: Diagnosis not present

## 2022-11-09 DIAGNOSIS — M4802 Spinal stenosis, cervical region: Secondary | ICD-10-CM | POA: Diagnosis not present

## 2022-11-21 ENCOUNTER — Encounter: Payer: Self-pay | Admitting: Orthopaedic Surgery

## 2022-11-21 ENCOUNTER — Ambulatory Visit (INDEPENDENT_AMBULATORY_CARE_PROVIDER_SITE_OTHER): Payer: Medicare Other | Admitting: Orthopaedic Surgery

## 2022-11-21 VITALS — BP 147/76 | HR 57 | Ht 59.0 in | Wt 113.0 lb

## 2022-11-21 DIAGNOSIS — M7542 Impingement syndrome of left shoulder: Secondary | ICD-10-CM

## 2022-11-21 DIAGNOSIS — M4722 Other spondylosis with radiculopathy, cervical region: Secondary | ICD-10-CM | POA: Diagnosis not present

## 2022-11-21 NOTE — Progress Notes (Signed)
Office Visit Note   Patient: Stephanie Barber           Date of Birth: 1941-03-20           MRN: [id number] Visit Date: 11/21/2022              Requested by: [name], MD [address],  [place] [address] PCP: [name], MD (Inactive)   Assessment & Plan: Visit Diagnoses:  1. Impingement syndrome of left shoulder   2. Other spondylosis with radiculopathy, cervical region     Plan: MRI scan is reviewed she does not have any significant cord compression that would be consistent with her lower extremity weakness.  Continues to the walker.  We discussed therapy as an option.  She is got improvement in her left shoulder with the injection.  She can follow-up if she has any progression of her symptoms.  Follow-Up Instructions: Return if symptoms worsen or fail to improve.   Orders:  No orders of the defined types were placed in this encounter.  No orders of the defined types were placed in this encounter.     Procedures: No procedures performed   Clinical Data: No additional findings.   Subjective: Chief Complaint  Patient presents with   Neck - Pain, Follow-up    MRI cervical spine review    HPI 82 year old female with cervical spondylosis seen for follow-up after MRI scan.  She has had previous subacromial injection states her shoulder is actually better.  Still has pain radiates from her neck down the arm to her wrist.  She is amatory with a rolling walker.  She states she has had some weakness in her legs.  Review of Systems   Objective: Vital Signs: BP (!) 147/76   Pulse (!) 57   Ht '4\' 11"'$  (1.499 m)   Wt 113 lb (51.3 kg)   BMI 22.82 kg/m   Physical Exam Constitutional:      Appearance: She is well-developed.  HENT:     Head: Normocephalic.     Right Ear: External ear normal.     Left Ear: External ear normal. There is no impacted cerumen.  Eyes:     Pupils: Pupils are equal, round, and reactive to light.  Neck:      Thyroid: No thyromegaly.     Trachea: No tracheal deviation.  Cardiovascular:     Rate and Rhythm: Normal rate.  Pulmonary:     Effort: Pulmonary effort is normal.  Abdominal:     Palpations: Abdomen is soft.  Musculoskeletal:     Cervical back: No rigidity.  Skin:    General: Skin is warm and dry.  Neurological:     Mental Status: She is alert and oriented to person, place, and time.  Psychiatric:        Behavior: Behavior normal.     Ortho Exam symmetrical lower extremity mild weakness.  No clonus.  Reflexes are intact.  Positive impingement left shoulder.  Specialty Comments:  No specialty comments available.  Imaging: Narrative & Impression  CLINICAL DATA:  Chronic neck pain   EXAM: MRI CERVICAL SPINE WITHOUT CONTRAST   TECHNIQUE: Multiplanar, multisequence MR imaging of the cervical spine was performed. No intravenous contrast was administered.   COMPARISON:  11/15/2016   FINDINGS: Alignment: Physiologic.   Vertebrae: No fracture, evidence of discitis, or bone lesion.   Cord: Normal signal and morphology.   Posterior Fossa, vertebral arteries, paraspinal tissues: Negative.   Disc levels:  C1-2: Unremarkable.   C2-3: Small central disc protrusion. There is no spinal canal stenosis. No neural foraminal stenosis.   C3-4: Small disc bulge with uncovertebral hypertrophy. Unchanged mild spinal canal stenosis. Mild bilateral neural foraminal stenosis.   C4-5: Small disc bulge with uncovertebral hypertrophy, slightly worsened. Worsened mild spinal canal stenosis. Mild bilateral neural foraminal stenosis.   C5-6: Small central disc protrusion with bilateral uncovertebral hypertrophy. Mild spinal canal stenosis. Unchanged moderate left neural foraminal stenosis.   C6-7: Unchanged small disc bulge. There is no spinal canal stenosis. No neural foraminal stenosis.   C7-T1: Unchanged left facet hypertrophy. There is no spinal canal stenosis. Unchanged  moderate left neural foraminal stenosis.   IMPRESSION: 1. Slight worsening of mild spinal canal stenosis at C4-5. 2. Unchanged mild spinal canal stenosis at C3-4 and C5-6. 3. Unchanged moderate left C5-6 and C7-T1 neural foraminal stenosis. 4. Otherwise mild multilevel foraminal stenosis, unchanged.     Electronically Signed   By: Ulyses Jarred M.D.   On: 11/13/2022 03:58     PMFS History: Patient Active Problem List   Diagnosis Date Noted   Impingement syndrome of left shoulder 10/31/2022   Other spondylosis with radiculopathy, cervical region 10/12/2022   GIB (gastrointestinal bleeding) 09/20/2022   Pancreatic insufficiency 09/20/2022   GI bleeding 07/23/2022   Stage 3b chronic kidney disease (CKD) ([place])    Other spondylosis with radiculopathy, lumbar region 09/19/2021   IDA (iron deficiency anemia) 07/06/2021   Protein-calorie malnutrition, severe 06/21/2021   ABLA (acute blood loss anemia)    Malnutrition of moderate degree 06/16/2021   Hematochezia 06/15/2021   Acute GI bleeding 06/15/2021   Hyperlipidemia associated with type 2 diabetes mellitus ([place]) 06/15/2021   Left-sided headache 06/30/2019   Memory loss 06/30/2019   AKI (acute kidney injury) ([place]) 05/06/2017   Dehydration 05/06/2017   Chest pain 05/06/2017   Wound infection after surgery 01/30/2017   Palpitations 08/24/2016   Noncompliance with CPAP treatment 12/28/2015   Poor compliance with CPAP treatment 10/26/2014   CPAP use counseling 07/28/2014   OSA on CPAP 07/28/2014   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   Hypersomnolent 04/02/2014   Obstructive sleep apnea 04/02/2014   Non-compliance with treatment 03/05/2014   Type 2 diabetes mellitus ([place]) 12/05/2011   Hypertension 12/05/2011   GERD (gastroesophageal reflux disease)    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   Chronic cough 07/26/2011   Past Medical History:  Diagnosis Date   Arthritis    Chronic headache    Complication of  anesthesia    allergy to Novocaine    Diabetes mellitus    GERD (gastroesophageal reflux disease)    GI bleed    Heart murmur    High cholesterol    Hyperlipidemia    Hypertension    Intracranial atherosclerosis    per MRA   Migraine    Narcolepsy and cataplexy    OSA on CPAP    states has not used CPAP in over a year   Rectal prolapse    Stage 3b chronic kidney disease (CKD) ([place])    Trigger finger of all digits of right hand     Family History  Problem Relation Age of Onset   Heart disease Mother    Throat cancer Father    Cancer Father        stomach   Kidney cancer Brother    Heart disease Brother     Past Surgical History:  Procedure Laterality Date   ABDOMINAL HYSTERECTOMY  Fibroids   BACK SURGERY     L4,L5 discectomy   CARDIAC CATHETERIZATION     CARPAL TUNNEL RELEASE Right 01/18/2017   COLONOSCOPY N/A 07/28/2022   Procedure: COLONOSCOPY;  Surgeon: Clarene Essex, MD;  Location: [facility];  Service: Gastroenterology;  Laterality: N/A;   COLONOSCOPY WITH PROPOFOL N/A 06/17/2021   Procedure: COLONOSCOPY WITH PROPOFOL;  Surgeon: Wilford Corner, MD;  Location: [facility];  Service: Endoscopy;  Laterality: N/A;   ESOPHAGOGASTRODUODENOSCOPY (EGD) WITH PROPOFOL N/A 06/17/2021   Procedure: ESOPHAGOGASTRODUODENOSCOPY (EGD) WITH PROPOFOL;  Surgeon: Wilford Corner, MD;  Location: [facility];  Service: Endoscopy;  Laterality: N/A;   KIDNEY SURGERY  1998   Right.  growth removed   POLYPECTOMY  06/17/2021   Procedure: POLYPECTOMY;  Surgeon: Wilford Corner, MD;  Location: [facility];  Service: Endoscopy;;   POLYPECTOMY  07/28/2022   Procedure: POLYPECTOMY;  Surgeon: Clarene Essex, MD;  Location: [facility];  Service: Gastroenterology;;   rectal prolapse repair  12/05/2011   ROTATOR CUFF REPAIR     Left   SHOULDER ARTHROSCOPY WITH SUBACROMIAL DECOMPRESSION, ROTATOR CUFF REPAIR AND BICEP TENDON REPAIR Left 05/25/2015   Procedure: SHOULDER ARTHROSCOPY WITH  SUBACROMIAL DECOMPRESSION, ROTATOR CUFF REPAIR AND BICEP TENODESIS, DEBRIDEMENT. ;  Surgeon: Meredith Pel, MD;  Location: [facility];  Service: Orthopedics;  Laterality: Left;  LEFT SHOULDER ROTATOR CUFF TEAR REPAIR, ARTHROSCOPY, DEBRIDEMENT, BICEPS TENODESIS, SUBACROMIAL DECOMPRESSION.   TOTAL ABDOMINAL HYSTERECTOMY     TRIGGER FINGER RELEASE Right 04/30/2018   Procedure: RELEASE TRIGGER FINGER/A-1 PULLEY RIGHT MIDDLE  AND SMALLn ;  Surgeon: Daryll Brod, MD;  Location: [facility];  Service: Orthopedics;  Laterality: Right;  FAB, Bier block   TUBAL LIGATION     Social History   Occupational History   Occupation: retired. prev worked at [employer].  Tobacco Use   Smoking status: Former    Types: Cigarettes    Quit date: 11/13/1980    Years since quitting: 42.0   Smokeless tobacco: Never   Tobacco comments:    quit in 1982  Vaping Use   Vaping Use: Never used  Substance and Sexual Activity   Alcohol use: No    Alcohol/week: 0.0 standard drinks of alcohol   Drug use: No   Sexual activity: Yes

## 2022-12-07 DIAGNOSIS — D509 Iron deficiency anemia, unspecified: Secondary | ICD-10-CM | POA: Diagnosis not present

## 2022-12-08 ENCOUNTER — Other Ambulatory Visit: Payer: Medicare Other

## 2022-12-08 ENCOUNTER — Ambulatory Visit: Payer: Medicare Other | Admitting: Hematology and Oncology

## 2022-12-12 ENCOUNTER — Other Ambulatory Visit: Payer: Self-pay | Admitting: *Deleted

## 2022-12-12 DIAGNOSIS — D508 Other iron deficiency anemias: Secondary | ICD-10-CM

## 2022-12-13 ENCOUNTER — Inpatient Hospital Stay (HOSPITAL_BASED_OUTPATIENT_CLINIC_OR_DEPARTMENT_OTHER): Payer: Medicare Other | Admitting: Hematology and Oncology

## 2022-12-13 ENCOUNTER — Other Ambulatory Visit: Payer: Self-pay

## 2022-12-13 ENCOUNTER — Inpatient Hospital Stay: Payer: Medicare Other | Attending: Hematology and Oncology

## 2022-12-13 VITALS — BP 137/71 | HR 55 | Temp 98.1°F | Resp 14 | Wt 112.6 lb

## 2022-12-13 DIAGNOSIS — D500 Iron deficiency anemia secondary to blood loss (chronic): Secondary | ICD-10-CM

## 2022-12-13 DIAGNOSIS — K922 Gastrointestinal hemorrhage, unspecified: Secondary | ICD-10-CM | POA: Diagnosis not present

## 2022-12-13 DIAGNOSIS — R197 Diarrhea, unspecified: Secondary | ICD-10-CM

## 2022-12-13 DIAGNOSIS — D508 Other iron deficiency anemias: Secondary | ICD-10-CM | POA: Diagnosis not present

## 2022-12-13 DIAGNOSIS — Z79899 Other long term (current) drug therapy: Secondary | ICD-10-CM | POA: Diagnosis not present

## 2022-12-13 DIAGNOSIS — Z87891 Personal history of nicotine dependence: Secondary | ICD-10-CM | POA: Insufficient documentation

## 2022-12-13 LAB — CBC WITH DIFFERENTIAL (CANCER CENTER ONLY)
Abs Immature Granulocytes: 0.01 10*3/uL (ref 0.00–0.07)
Basophils Absolute: 0 10*3/uL (ref 0.0–0.1)
Basophils Relative: 1 %
Eosinophils Absolute: 0.1 10*3/uL (ref 0.0–0.5)
Eosinophils Relative: 4 %
HCT: 36.6 % (ref 36.0–46.0)
Hemoglobin: 12 g/dL (ref 12.0–15.0)
Immature Granulocytes: 0 %
Lymphocytes Relative: 39 %
Lymphs Abs: 1.3 10*3/uL (ref 0.7–4.0)
MCH: 29.6 pg (ref 26.0–34.0)
MCHC: 32.8 g/dL (ref 30.0–36.0)
MCV: 90.4 fL (ref 80.0–100.0)
Monocytes Absolute: 0.4 10*3/uL (ref 0.1–1.0)
Monocytes Relative: 11 %
Neutro Abs: 1.6 10*3/uL — ABNORMAL LOW (ref 1.7–7.7)
Neutrophils Relative %: 45 %
Platelet Count: 203 10*3/uL (ref 150–400)
RBC: 4.05 MIL/uL (ref 3.87–5.11)
RDW: 13.2 % (ref 11.5–15.5)
WBC Count: 3.4 10*3/uL — ABNORMAL LOW (ref 4.0–10.5)
nRBC: 0 % (ref 0.0–0.2)

## 2022-12-13 LAB — CMP (CANCER CENTER ONLY)
ALT: 28 U/L (ref 0–44)
AST: 29 U/L (ref 15–41)
Albumin: 4 g/dL (ref 3.5–5.0)
Alkaline Phosphatase: 56 U/L (ref 38–126)
Anion gap: 10 (ref 5–15)
BUN: 28 mg/dL — ABNORMAL HIGH (ref 8–23)
CO2: 24 mmol/L (ref 22–32)
Calcium: 9.3 mg/dL (ref 8.9–10.3)
Chloride: 108 mmol/L (ref 98–111)
Creatinine: 1.72 mg/dL — ABNORMAL HIGH (ref 0.44–1.00)
GFR, Estimated: 29 mL/min — ABNORMAL LOW (ref >60)
Glucose, Bld: 105 mg/dL — ABNORMAL HIGH (ref 70–99)
Potassium: 4.4 mmol/L (ref 3.5–5.1)
Sodium: 142 mmol/L (ref 135–145)
Total Bilirubin: 0.4 mg/dL (ref 0.3–1.2)
Total Protein: 8.2 g/dL — ABNORMAL HIGH (ref 6.5–8.1)

## 2022-12-13 LAB — IRON AND IRON BINDING CAPACITY (CC-WL,HP ONLY)
Iron: 83 ug/dL (ref 28–170)
Saturation Ratios: 25 % (ref 10.4–31.8)
TIBC: 329 ug/dL (ref 250–450)
UIBC: 246 ug/dL

## 2022-12-13 LAB — FERRITIN: Ferritin: 100 ng/mL (ref 11–307)

## 2022-12-13 LAB — MAGNESIUM: Magnesium: 2 mg/dL (ref 1.7–2.4)

## 2022-12-13 NOTE — Progress Notes (Signed)
[place] [phone]   [phone]  PROGRESS NOTE  Patient Care Team: [name], MD (Inactive) as PCP - General (Family Medicine) Ronald Lobo, MD as Consulting Physician (Gastroenterology) Clance, Armando Reichert, MD as Consulting Physician (Pulmonary Disease)  Hematological/Oncological History  #Iron Deficiency Anemia of GI Origin 1) 05/24/2021: Labs from PCP, Dr. [name] showed WBC 5.0, Hgb 8.7 (L), MCV 85.2, Plt 277, Ferritin 6.2 (L), Iron 24 (L), Transferrin 315, Iron saturation 6%, TIBC 441.   2)Admitted from 06/15/2021-06/17/2021 for multiple episodes of rectal bleeding. Hgb was 8.3, Fecal occult blood test was positive. Patient was taking Xarelto for history of DVT, last dose 06/14/2021. CT scan showed some diverticulosis with no active bleeding. Underwent EGD and colonoscopy with no source of bleeding. Felt to be diverticular bleeding exacerbated by use of anticoagulation. Xarelto was stopped and received 3 units of pRBC. Hgb was 11.8.  3) Readmitted from 06/20/2021-06/23/2021 for drop in Hgb from 11.8 to 10.7. Recommended to initiate ferrous sulfate 325 mg once daily and to follow up with PCP.   4) 07/06/2021: Establish care with Dede Query PA-C  5) 07/27/2021-08/27/2021: Received IV venofer x 5 doses  HISTORY OF PRESENTING ILLNESS:  Stephanie Barber 82 y.o. female returns to the clinic for follow-up for iron deficiency anemia. She was last seen in the office on 12/07/21. Since then patient was recently seen in the ED on 04/07/2022 for rectal bleeding.   Stephanie Barber reports she continues to have severe bouts of diarrhea.  She has diarrhea approximately 5 times per day.  She notes she does feel better after receiving the IV iron infusions but is mostly focused on her urinary and bowel issues.  She notes that she takes magnesium pills and would like to be able stop these.  She thinks this may be contributing.  She notes she is not having any  bleeding or dark stools.  She notes that if she eats anything she has an immediate bowel movement.  She is not having any shortness of breath, lightheadedness, or dizziness.  She reports that she eats a regular diet and is "never been a bad eater".  She does enjoy chicken and occasional beef.  She denies fevers, chills, night sweats, shortness of breath, chest pain or cough. She has no other complaints. Rest of the 10 point ROS  is below.    MEDICAL HISTORY:  Past Medical History:  Diagnosis Date   Arthritis    Chronic headache    Complication of anesthesia    allergy to Novocaine    Diabetes mellitus    GERD (gastroesophageal reflux disease)    GI bleed    Heart murmur    High cholesterol    Hyperlipidemia    Hypertension    Intracranial atherosclerosis    per MRA   Migraine    Narcolepsy and cataplexy    OSA on CPAP    states has not used CPAP in over a year   Rectal prolapse    Stage 3b chronic kidney disease (CKD) (HCC)    Trigger finger of all digits of right hand     SURGICAL HISTORY: Past Surgical History:  Procedure Laterality Date   ABDOMINAL HYSTERECTOMY     Fibroids   BACK SURGERY     L4,L5 discectomy   CARDIAC CATHETERIZATION     CARPAL TUNNEL RELEASE Right 01/18/2017   COLONOSCOPY N/A 07/28/2022   Procedure: COLONOSCOPY;  Surgeon: Clarene Essex, MD;  Location: [facility];  Service:  Gastroenterology;  Laterality: N/A;   COLONOSCOPY WITH PROPOFOL N/A 06/17/2021   Procedure: COLONOSCOPY WITH PROPOFOL;  Surgeon: Wilford Corner, MD;  Location: [facility];  Service: Endoscopy;  Laterality: N/A;   ESOPHAGOGASTRODUODENOSCOPY (EGD) WITH PROPOFOL N/A 06/17/2021   Procedure: ESOPHAGOGASTRODUODENOSCOPY (EGD) WITH PROPOFOL;  Surgeon: Wilford Corner, MD;  Location: [facility];  Service: Endoscopy;  Laterality: N/A;   KIDNEY SURGERY  1998   Right.  growth removed   POLYPECTOMY  06/17/2021   Procedure: POLYPECTOMY;  Surgeon: Wilford Corner, MD;  Location: [facility];  Service: Endoscopy;;   POLYPECTOMY  07/28/2022   Procedure: POLYPECTOMY;  Surgeon: Clarene Essex, MD;  Location: [facility];  Service: Gastroenterology;;   rectal prolapse repair  12/05/2011   ROTATOR CUFF REPAIR     Left   SHOULDER ARTHROSCOPY WITH SUBACROMIAL DECOMPRESSION, ROTATOR CUFF REPAIR AND BICEP TENDON REPAIR Left 05/25/2015   Procedure: SHOULDER ARTHROSCOPY WITH SUBACROMIAL DECOMPRESSION, ROTATOR CUFF REPAIR AND BICEP TENODESIS, DEBRIDEMENT. ;  Surgeon: Meredith Pel, MD;  Location: [place];  Service: Orthopedics;  Laterality: Left;  LEFT SHOULDER ROTATOR CUFF TEAR REPAIR, ARTHROSCOPY, DEBRIDEMENT, BICEPS TENODESIS, SUBACROMIAL DECOMPRESSION.   TOTAL ABDOMINAL HYSTERECTOMY     TRIGGER FINGER RELEASE Right 04/30/2018   Procedure: RELEASE TRIGGER FINGER/A-1 PULLEY RIGHT MIDDLE  AND SMALLn ;  Surgeon: Daryll Brod, MD;  Location: [place];  Service: Orthopedics;  Laterality: Right;  FAB, Bier block   TUBAL LIGATION      SOCIAL HISTORY: Social History   Socioeconomic History   Marital status: Married    Spouse name: [name]   Number of children: 4   Years of education: 14   Highest education level: Not on file  Occupational History   Occupation: retired. prev worked at [employer].  Tobacco Use   Smoking status: Former    Types: Cigarettes    Quit date: 11/13/1980    Years since quitting: 42.1   Smokeless tobacco: Never   Tobacco comments:    quit in 1982  Vaping Use   Vaping Use: Never used  Substance and Sexual Activity   Alcohol use: No    Alcohol/week: 0.0 standard drinks of alcohol   Drug use: No   Sexual activity: Yes  Other Topics Concern   Not on file  Social History Narrative   21 st January 2014 , patient underwent PS and MSLT - MSLT had one  SREMs and an average time to fall asleep of  *.8 minutes , her Ewort is 20 points,: facit: this patient has severe hypersomnia and is at risk when driving. medication in form ogf  nuvigil smaples had been dispensed to her but she ha not yet taken it. 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 In detail -discussion of diagnosis and treatment  takes place today , first with nuvigil and if insufficient, with XYREM. Her CPAP treats her OSA very well, and she uses it 6 hours  or more each night,  residual AHI of 1.1  would not allow for OSA to be still explaining this degree of sleepiness. 2 downloads were reviewed. labs reviewed.    Patient is married [name]) and lives at home with her husband and grandchild.   Patient has four children   Patient is retired.   Patient has a college education.   Patient is right-handed.  Patient does not drink any caffeine.      Social Determinants of Health   Financial Resource Strain: Not on file  Food Insecurity: No Food Insecurity (09/20/2022)   Hunger Vital Sign    Worried About Running Out of Food in the Last Year: Never true    Ran Out of Food in the Last Year: Never true  Transportation Needs: No Transportation Needs (09/20/2022)   PRAPARE - Hydrologist (Medical): No    Lack of Transportation (Non-Medical): No  Physical Activity: Not on file  Stress: Not on file  Social Connections: Not on file  Intimate Partner Violence: Not At Risk (09/20/2022)   Humiliation, Afraid, Rape, and Kick questionnaire    Fear of Current or Ex-Partner: No    Emotionally Abused: No    Physically Abused: No    Sexually Abused: No    FAMILY HISTORY: Family History  Problem Relation Age of Onset   Heart disease Mother    Throat cancer Father    Cancer Father        stomach   Kidney cancer Brother    Heart disease Brother     ALLERGIES:  is allergic to morphine and related and lactose intolerance (gi).  MEDICATIONS:  Current Outpatient Medications  Medication  Sig Dispense Refill   acetaminophen (TYLENOL) 500 MG tablet Take 500 mg by mouth in the morning and at bedtime.     acetaminophen (TYLENOL) 650 MG CR tablet Take 650 mg by mouth in the morning and at bedtime.     amLODipine (NORVASC) 10 MG tablet Take 10 mg by mouth in the morning.     cyanocobalamin (VITAMIN B12) 1000 MCG tablet Take 1,000 mcg by mouth daily.     lipase/protease/amylase (CREON) 36000 UNITS CPEP capsule Take 36,000 Units by mouth in the morning and at bedtime. Original Sig:  2 capsules (72000 units) 3 times daily with meals, 1 capsule (36000 units) with snacks     meloxicam (MOBIC) 15 MG tablet Take 15 mg by mouth daily.     metoprolol succinate (TOPROL-XL) 50 MG 24 hr tablet Take 50 mg by mouth in the morning.     pantoprazole (PROTONIX) 40 MG tablet Take 1 tablet (40 mg total) by mouth daily. 30 tablet 11   No current facility-administered medications for this visit.    REVIEW OF SYSTEMS:   Constitutional: ( - ) fevers, ( - )  chills , ( - ) night sweats Eyes: ( - ) blurriness of vision, ( - ) double vision, ( - ) watery eyes Ears, nose, mouth, throat, and face: ( - ) mucositis, ( - ) sore throat Respiratory: ( - ) cough, ( - ) dyspnea, ( - ) wheezes Cardiovascular: ( - ) palpitation, ( - ) chest discomfort, ( - ) lower extremity swelling Gastrointestinal:  ( - ) nausea, ( - ) heartburn, ( - ) change in bowel habits Skin: ( - ) abnormal skin rashes Lymphatics: ( - ) new lymphadenopathy, ( - ) easy bruising Neurological: ( - ) numbness, ( - ) tingling, ( - ) new weaknesses Behavioral/Psych: ( - ) mood change, ( - ) new changes  All other systems were reviewed with the patient and are negative.  PHYSICAL EXAMINATION: ECOG PERFORMANCE STATUS: 0 - Asymptomatic  Vitals:   12/13/22 1507  BP: 137/71  Pulse: (!) 55  Resp: 14  Temp: 98.1 F (36.7 C)  SpO2: 100%   Filed Weights  12/13/22 1507  Weight: 112 lb 9.6 oz (51.1 kg)    GENERAL: well appearing female in  NAD  SKIN: skin color, texture, turgor are normal, no rashes or significant lesions EYES: conjunctiva are pink and non-injected, sclera clear.  LUNGS: clear to auscultation and percussion with normal breathing effort HEART: regular rate & rhythm and no murmurs and no lower extremity edema Musculoskeletal: no cyanosis of digits and no clubbing  PSYCH: alert & oriented x 3, fluent speech NEURO: no focal motor/sensory deficits  LABORATORY DATA:  I have reviewed the data as listed    Latest Ref Rng & Units 12/13/2022    2:37 PM 09/28/2022   12:46 PM 09/28/2022    7:05 AM  CBC  WBC 4.0 - 10.5 K/uL 3.4   6.7   Hemoglobin 12.0 - 15.0 g/dL 12.0  9.3  9.0   Hematocrit 36.0 - 46.0 % 36.6  28.5  27.6   Platelets 150 - 400 K/uL 203   251        Latest Ref Rng & Units 12/13/2022    2:37 PM 09/28/2022    7:05 AM 09/23/2022    4:05 AM  CMP  Glucose 70 - 99 mg/dL 105  128  90   BUN 8 - 23 mg/dL '28  27  18   '$ Creatinine 0.44 - 1.00 mg/dL 1.72  1.75  1.23   Sodium 135 - 145 mmol/L 142  141  141   Potassium 3.5 - 5.1 mmol/L 4.4  4.2  4.4   Chloride 98 - 111 mmol/L 108  116  116   CO2 22 - 32 mmol/L '24  19  21   '$ Calcium 8.9 - 10.3 mg/dL 9.3  9.2  9.3   Total Protein 6.5 - 8.1 g/dL 8.2  6.8    Total Bilirubin 0.3 - 1.2 mg/dL 0.4  0.5    Alkaline Phos 38 - 126 U/L 56  60    AST 15 - 41 U/L 29  22    ALT 0 - 44 U/L 28  17      RADIOGRAPHIC STUDIES: I have personally reviewed the radiological images as listed and agreed with the findings in the report. No results found.  ASSESSMENT & PLAN Stephanie Barber is a 82 y.o. female returns for a follow up for iron deficiency anemia.   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 Most recent EGD and colonoscopy from 06/17/2021 with no signs of active bleeding. Requested follow up due to recent episode of rectal bleeding in May 2023.  --Recommend to continue to incorporate iron rich foods into diet.   --Received IV venofer x 5 doses from 07/27/2021-08/27/2021.  --Labs today shows Hgb 12.0, MCV 90.4, Plt 203, WBC 3.4 --continue ferrous sulfate 325 mg once daily with a source of vitamin C --RTC in 6 months with repeat labs.   #DVT of right femoral vein: --Diagnosed in 06/03/2019. CTA negative for PE. Started on Xarelto.  --Discontinued with during  hospitalization for GI bleed.   #Appetite loss/lethargy: --weight stable at 112 lbs.  --Continue to monitor.   Orders Placed This Encounter  Procedures   Magnesium    Standing Status:   Future    Number of Occurrences:   1    Standing Expiration Date:   12/13/2023    All questions were answered. The patient knows to call the clinic with any problems, questions or concerns.  I have spent a  total of 30 minutes minutes of face-to-face and non-face-to-face time, preparing to see the patient, performing a medically appropriate examination, counseling and educating the patient, documenting clinical information in the electronic health record, and care coordination.    Ledell Peoples, MD Department of Hematology/Oncology [place] [facility] Phone: [phone] Pager: [phone] Email: [email or web address]

## 2022-12-15 DIAGNOSIS — R350 Frequency of micturition: Secondary | ICD-10-CM | POA: Diagnosis not present

## 2022-12-19 ENCOUNTER — Other Ambulatory Visit: Payer: Self-pay | Admitting: Urology

## 2022-12-20 NOTE — Patient Instructions (Signed)
DUE TO COVID-19 ONLY TWO VISITORS  (aged 83 and older)  ARE ALLOWED TO COME WITH YOU AND STAY IN THE WAITING ROOM ONLY DURING PRE OP AND PROCEDURE.   **NO VISITORS ARE ALLOWED IN THE SHORT STAY AREA OR RECOVERY ROOM!!**  IF YOU WILL BE ADMITTED INTO THE HOSPITAL YOU ARE ALLOWED ONLY FOUR SUPPORT PEOPLE DURING VISITATION HOURS ONLY (7 AM -8PM)   The support person(s) must pass our screening, gel in and out, and wear a mask at all times, including in the patient's room. Patients must also wear a mask when staff or their support person are in the room. Visitors GUEST BADGE MUST BE WORN VISIBLY  One adult visitor may remain with you overnight and MUST be in the room by 8 P.M.     Your procedure is scheduled on: 01/02/23   Report to [facility] Main Entrance    Report to admitting at : 10:15 AM   Call this number if you have problems the morning of surgery [phone]   Do not eat food :After Midnight.   After Midnight you may have the following liquids until: 9:30 AM DAY OF SURGERY  Water Black Coffee (sugar ok, NO MILK/CREAM OR CREAMERS)  Tea (sugar ok, NO MILK/CREAM OR CREAMERS) regular and decaf                             Plain Jell-O (NO RED)                                           Fruit ices (not with fruit pulp, NO RED)                                     Popsicles (NO RED)                                                                  Juice: apple, WHITE grape, WHITE cranberry Sports drinks like Gatorade (NO RED)              Oral Hygiene is also important to reduce your risk of infection.                                    Remember - BRUSH YOUR TEETH THE MORNING OF SURGERY WITH YOUR REGULAR TOOTHPASTE  DENTURES WILL BE REMOVED PRIOR TO SURGERY PLEASE DO NOT APPLY "Poly grip" OR ADHESIVES!!!   Do NOT smoke after Midnight   Take these medicines the morning of surgery with A SIP OF WATER: metoprolol,amlodipine.  DO NOT TAKE ANY ORAL DIABETIC MEDICATIONS DAY OF  YOUR SURGERY  Bring CPAP mask and tubing day of surgery.                              You may not have any metal on your body including hair pins, jewelry, and body piercing  Do not wear make-up, lotions, powders, perfumes/cologne, or deodorant  Do not wear nail polish including gel and S&S, artificial/acrylic nails, or any other type of covering on natural nails including finger and toenails. If you have artificial nails, gel coating, etc. that needs to be removed by a nail salon please have this removed prior to surgery or surgery may need to be canceled/ delayed if the surgeon/ anesthesia feels like they are unable to be safely monitored.   Do not shave  48 hours prior to surgery.    Do not bring valuables to the hospital. [place].   Contacts, glasses, or bridgework may not be worn into surgery.   Bring small overnight bag day of surgery.   DO NOT [place]. PHARMACY WILL DISPENSE MEDICATIONS LISTED ON YOUR MEDICATION LIST TO YOU DURING YOUR ADMISSION [place]!    Patients discharged on the day of surgery will not be allowed to drive home.  Someone NEEDS to stay with you for the first 24 hours after anesthesia.   Special Instructions: Bring a copy of your healthcare power of attorney and living will documents         the day of surgery if you haven't scanned them before.              Please read over the following fact sheets you were given: IF YOU HAVE QUESTIONS ABOUT YOUR PRE-OP INSTRUCTIONS PLEASE CALL [phone]    [facility] - Preparing for Surgery Before surgery, you can play an important role.  Because skin is not sterile, your skin needs to be as free of germs as possible.  You can reduce the number of germs on your skin by washing with CHG (chlorahexidine gluconate) soap before surgery.  CHG is an antiseptic cleaner which kills germs and bonds with the skin to continue  killing germs even after washing. Please DO NOT use if you have an allergy to CHG or antibacterial soaps.  If your skin becomes reddened/irritated stop using the CHG and inform your nurse when you arrive at Short Stay. Do not shave (including legs and underarms) for at least 48 hours prior to the first CHG shower.  You may shave your face/neck. Please follow these instructions carefully:  1.  Shower with CHG Soap the night before surgery and the  morning of Surgery.  2.  If you choose to wash your hair, wash your hair first as usual with your  normal  shampoo.  3.  After you shampoo, rinse your hair and body thoroughly to remove the  shampoo.                           4.  Use CHG as you would any other liquid soap.  You can apply chg directly  to the skin and wash                       Gently with a scrungie or clean washcloth.  5.  Apply the CHG Soap to your body ONLY FROM THE NECK DOWN.   Do not use on face/ open                           Wound or open sores. Avoid contact with eyes,  ears mouth and genitals (private parts).                       Wash face,  Genitals (private parts) with your normal soap.             6.  Wash thoroughly, paying special attention to the area where your surgery  will be performed.  7.  Thoroughly rinse your body with warm water from the neck down.  8.  DO NOT shower/wash with your normal soap after using and rinsing off  the CHG Soap.                9.  Pat yourself dry with a clean towel.            10.  Wear clean pajamas.            11.  Place clean sheets on your bed the night of your first shower and do not  sleep with pets. Day of Surgery : Do not apply any lotions/deodorants the morning of surgery.  Please wear clean clothes to the hospital/surgery center.  FAILURE TO FOLLOW THESE INSTRUCTIONS MAY RESULT IN THE CANCELLATION OF YOUR SURGERY PATIENT SIGNATURE_________________________________  NURSE  SIGNATURE__________________________________  ________________________________________________________________________

## 2022-12-21 ENCOUNTER — Encounter (HOSPITAL_COMMUNITY): Payer: Self-pay

## 2022-12-21 ENCOUNTER — Other Ambulatory Visit: Payer: Self-pay

## 2022-12-21 ENCOUNTER — Encounter (HOSPITAL_COMMUNITY)
Admission: RE | Admit: 2022-12-21 | Discharge: 2022-12-21 | Disposition: A | Discharge status: Home / Self Care | Payer: Medicare Other | Source: Ambulatory Visit | Attending: Urology | Admitting: Urology

## 2022-12-21 VITALS — BP 162/78 | HR 58 | Temp 98.1°F | Ht 59.0 in | Wt 113.0 lb

## 2022-12-21 DIAGNOSIS — N1832 Chronic kidney disease, stage 3b: Secondary | ICD-10-CM | POA: Diagnosis not present

## 2022-12-21 DIAGNOSIS — E1122 Type 2 diabetes mellitus with diabetic chronic kidney disease: Secondary | ICD-10-CM | POA: Diagnosis not present

## 2022-12-21 DIAGNOSIS — Z01812 Encounter for preprocedural laboratory examination: Secondary | ICD-10-CM | POA: Diagnosis not present

## 2022-12-21 HISTORY — DX: Peripheral vascular disease, unspecified: I73.9

## 2022-12-21 LAB — HEMOGLOBIN A1C
Hgb A1c MFr Bld: 5.8 % — ABNORMAL HIGH (ref 4.8–5.6)
Mean Plasma Glucose: 119.76 mg/dL

## 2022-12-21 LAB — GLUCOSE, CAPILLARY: Glucose-Capillary: 85 mg/dL (ref 70–99)

## 2022-12-21 NOTE — Progress Notes (Addendum)
For Short Stay: [place] appointment date:  Bowel Prep reminder:   For Anesthesia: PCP - Engineer, structural at [employer]. Cardiologist -  N/A  Chest x-ray - 09/28/22 EKG - 09/28/22 Stress Test -  ECHO - 05/07/17 Cardiac Cath -  Pacemaker/ICD device last checked: Pacemaker orders received: Device Rep notified:  Spinal Cord Stimulator:  Sleep Study - Yes CPAP - NO  Fasting Blood Sugar - N/A Checks Blood Sugar ___0__ times a day Date and result of last Hgb A1c-5.8: 09/22/22  Last dose of GLP1 agonist-  GLP1 instructions:   Last dose of SGLT-2 inhibitors-  SGLT-2 instructions:   Blood Thinner Instructions: Aspirin Instructions: Last Dose:  Activity level: Can go up a flight of stairs and activities of daily living without stopping and without chest pain and/or shortness of breath   Able to exercise without chest pain and/or shortness of breath  Anesthesia review: Hx: HTN,DIA,Heart murmur,OSA(CPAP),CKD III  Patient denies shortness of breath, fever, cough and chest pain at PAT appointment   Patient verbalized understanding of instructions that were given to them at the PAT appointment. Patient was also instructed that they will need to review over the PAT instructions again at home before surgery.

## 2022-12-29 NOTE — H&P (Signed)
CC/HPI: cc: Urinary urgency, frequency, nocturia   09/27/2022: 82 year old woman comes in with longstanding history of urinary frequency every 1 hour and nocturia up to 8 times at night. She has urinary urgency and incontinence and wears depends. She states that accidents mostly happen at night when she sleeping by the time she wakes up she is already wet. She denies any UTIs. Her PVR today is 47 cc. She drinks Pedialyte, water, coffee and orange juice. She denies any history of sleep apnea or snoring.   11/08/2022: 82 year old woman with a longstanding history of urinary frequency, nocturia and urge incontinence here for follow-up. She was given a trial of Myrbetriq at her last visit however she developed diarrhea and stopped the medication. She is unsure whether or not the medication helped a little bit. She is wearing depends. Her PVR today is 127 cc.   12/15/2022: 82 year old woman with a history of mixed urinary incontinence here for follow-up after urodynamic study. Urodynamic study showed both stress urinary continence as well as positive instability and urge incontinence. Patient was unable to tolerate Myrbetriq.   UDS SUMMARY  Ms. Sare held a max capacity of approx. 130 mls. There was positive SUI. She leaked a minor amount with Valsalva. There was positive instability. Multiple unstable contractions were noted at the beginning of the study. She felt an a strong urgency and leaked severely off these contractions. She was able to generate a voluntary contraction and void 130 mls with max flow 8 ml/s. EMG leads were basically quiet during the voiding phase. PVR was approx. 0 mls. Mild trabeculation was noted. No reflux was seen.     ALLERGIES: Morphine Derivatives    MEDICATIONS: AmLODIPine Besylate 10 MG Oral Tablet Oral  Donepezil Hcl  Iron  Loperamide 2 mg capsule  Losartan Potassium  MetFORMIN HCl - 500 MG Oral Tablet Oral  Metoprolol Succinate 50 mg tablet, extended release 24 hr 1  Oral Daily  RaNITidine HCl - 300 MG Oral Capsule Oral  Rosuvastatin Calcium  Tylenol  Vitamin B12     GU PSH: Complex cystometrogram, w/ void pressure and urethral pressure profile studies, any technique - 11/29/2022 Complex Uroflow - 11/29/2022 Emg surf Electrd - 11/29/2022 Hysterectomy Unilat SO - 2011 Inject For cystogram - 11/29/2022 Intrabd voidng Press - 11/29/2022       PSH Notes: Back Surgery, Shoulder Surgery, Hysterectomy   NON-GU PSH: None   GU PMH: Mixed incontinence - 11/29/2022, - 11/08/2022, - 09/27/2022 Nocturia - 11/08/2022, - 09/27/2022 Urinary Frequency - 11/08/2022, - 09/27/2022 Urinary Urgency - 11/08/2022, - 09/27/2022      PMH Notes:  2010-03-03 14:02:39 - Note: Normal Routine History And Physical Senior Citizen (76-80)  2010-01-24 16:17:32 - Note: Blood Disorders   NON-GU PMH: Arrhythmia, Rhythm Disorder - 2014 Cerebral infarction, unspecified, Stroke Syndrome - 2014 Muscle weakness (generalized), Muscle weakness - 2014 Other lack of coordination, Other lack of coordination - 2014 Personal history of other diseases of the circulatory system, History of hypertension - 2014 Personal history of other diseases of the digestive system, History of esophageal reflux - 2014 Personal history of other endocrine, nutritional and metabolic disease, History of hypercholesterolemia - 2014, History of diabetes mellitus, - 2014 Diabetes Type 2 Hypertension    FAMILY HISTORY: Death In The Family Father - Runs In Family Death In The Family Mother - Runs In Family Family Health Status Number - Runs In Family nephrolithiasis - Runs In Family   SOCIAL HISTORY: Marital Status: Married Preferred Language: English; Ethnicity:  Not Hispanic Or Latino; Race: Black or African American Current Smoking Status: Patient has never smoked.   Tobacco Use Assessment Completed: Used Tobacco in last 30 days? Has never drank.  Does not drink caffeine.     Notes: Activities Of Daily  Living, Living Independently With Spouse, Self-reliant In Usual Daily Activities, Exercise Habits, Caffeine Use, Occupation:, Marital History - Currently Married, Alcohol Use, Tobacco Use   REVIEW OF SYSTEMS:    GU Review Female:   Patient reports frequent urination, hard to postpone urination, and leakage of urine. Patient denies burning /pain with urination, get up at night to urinate, stream starts and stops, trouble starting your stream, have to strain to urinate, and being pregnant.  Gastrointestinal (Upper):   Patient denies nausea, vomiting, and indigestion/ heartburn.  Gastrointestinal (Lower):   Patient reports diarrhea. Patient denies constipation.  Constitutional:   Patient denies fever, night sweats, weight loss, and fatigue.  Skin:   Patient denies skin rash/ lesion and itching.  Eyes:   Patient denies blurred vision and double vision.  Ears/ Nose/ Throat:   Patient denies sore throat and sinus problems.  Hematologic/Lymphatic:   Patient denies swollen glands and easy bruising.  Cardiovascular:   Patient denies leg swelling and chest pains.  Respiratory:   Patient denies shortness of breath and cough.  Endocrine:   Patient denies excessive thirst.  Musculoskeletal:   Patient denies back pain and joint pain.  Neurological:   Patient denies headaches and dizziness.  Psychologic:   Patient denies depression and anxiety.   VITAL SIGNS: None   MULTI-SYSTEM PHYSICAL EXAMINATION:    Constitutional: Well-nourished. No physical deformities. Normally developed. Good grooming.  Neck: Neck symmetrical, not swollen. Normal tracheal position.  Respiratory: No labored breathing, no use of accessory muscles.   Skin: No paleness, no jaundice, no cyanosis. No lesion, no ulcer, no rash.  Neurologic / Psychiatric: Oriented to time, oriented to place, oriented to person. No depression, no anxiety, no agitation.  Eyes: Normal conjunctivae. Normal eyelids.  Ears, Nose, Mouth, and Throat: Left ear no  scars, no lesions, no masses. Right ear no scars, no lesions, no masses. Nose no scars, no lesions, no masses. Normal hearing. Normal lips.  Musculoskeletal: Normal gait and station of head and neck.     Complexity of Data:  Records Review:   Previous Patient Records, POC Tool  Urine Test Review:   Urinalysis  Urodynamics Review:   Review Urodynamics Tests   PROCEDURES: None   ASSESSMENT:      ICD-10 Details  1 GU:   Mixed incontinence - N39.46 Chronic, Stable  2   Nocturia - R35.1 Chronic, Stable  3   Urinary Frequency - R35.0 Chronic, Stable  4   Urinary Urgency - R39.15 Chronic, Stable   PLAN:           Document Letter(s):  Created for Patient: Clinical Summary         Notes:   Mixed urinary incontinence: I reviewed patient's urodynamic study which showed positive instability as well as stress urinary incontinence. We discussed management options including Bulkamid for stress urinary continence and Botox for urge urinary incontinence. Risks and benefits of both of these were discussed with the patient in detail including but not limited to pain, bleeding, dysuria, urinary retention, need for indwelling Foley catheter versus CIC, failure to improve symptoms. Patient understand these risks and wishes to proceed. She will be scheduled for Bulkamid and Botox with MAC.

## 2023-01-02 ENCOUNTER — Encounter (HOSPITAL_COMMUNITY): Payer: Self-pay | Admitting: Urology

## 2023-01-02 ENCOUNTER — Ambulatory Visit (HOSPITAL_COMMUNITY)
Admission: RE | Admit: 2023-01-02 | Discharge: 2023-01-02 | Disposition: A | Discharge status: Home / Self Care | Payer: Medicare Other | Attending: Urology | Admitting: Urology

## 2023-01-02 ENCOUNTER — Other Ambulatory Visit: Payer: Self-pay

## 2023-01-02 ENCOUNTER — Ambulatory Visit (HOSPITAL_COMMUNITY): Payer: Medicare Other | Admitting: Physician Assistant

## 2023-01-02 ENCOUNTER — Ambulatory Visit (HOSPITAL_BASED_OUTPATIENT_CLINIC_OR_DEPARTMENT_OTHER): Payer: Medicare Other | Admitting: Anesthesiology

## 2023-01-02 ENCOUNTER — Encounter (HOSPITAL_COMMUNITY)
Admission: RE | Disposition: A | Discharge status: Home / Self Care | Payer: Self-pay | Source: Home / Self Care | Attending: Urology

## 2023-01-02 DIAGNOSIS — Z79899 Other long term (current) drug therapy: Secondary | ICD-10-CM | POA: Insufficient documentation

## 2023-01-02 DIAGNOSIS — K219 Gastro-esophageal reflux disease without esophagitis: Secondary | ICD-10-CM | POA: Diagnosis not present

## 2023-01-02 DIAGNOSIS — G4733 Obstructive sleep apnea (adult) (pediatric): Secondary | ICD-10-CM | POA: Diagnosis not present

## 2023-01-02 DIAGNOSIS — I129 Hypertensive chronic kidney disease with stage 1 through stage 4 chronic kidney disease, or unspecified chronic kidney disease: Secondary | ICD-10-CM | POA: Insufficient documentation

## 2023-01-02 DIAGNOSIS — I10 Essential (primary) hypertension: Secondary | ICD-10-CM | POA: Diagnosis not present

## 2023-01-02 DIAGNOSIS — G47419 Narcolepsy without cataplexy: Secondary | ICD-10-CM | POA: Diagnosis not present

## 2023-01-02 DIAGNOSIS — E1122 Type 2 diabetes mellitus with diabetic chronic kidney disease: Secondary | ICD-10-CM | POA: Insufficient documentation

## 2023-01-02 DIAGNOSIS — E1151 Type 2 diabetes mellitus with diabetic peripheral angiopathy without gangrene: Secondary | ICD-10-CM | POA: Insufficient documentation

## 2023-01-02 DIAGNOSIS — Z7984 Long term (current) use of oral hypoglycemic drugs: Secondary | ICD-10-CM | POA: Insufficient documentation

## 2023-01-02 DIAGNOSIS — N3946 Mixed incontinence: Secondary | ICD-10-CM

## 2023-01-02 DIAGNOSIS — E119 Type 2 diabetes mellitus without complications: Secondary | ICD-10-CM | POA: Diagnosis not present

## 2023-01-02 DIAGNOSIS — N1832 Chronic kidney disease, stage 3b: Secondary | ICD-10-CM | POA: Insufficient documentation

## 2023-01-02 DIAGNOSIS — Z87891 Personal history of nicotine dependence: Secondary | ICD-10-CM | POA: Diagnosis not present

## 2023-01-02 DIAGNOSIS — M1990 Unspecified osteoarthritis, unspecified site: Secondary | ICD-10-CM | POA: Insufficient documentation

## 2023-01-02 HISTORY — PX: CYSTOSCOPY WITH INJECTION: SHX1424

## 2023-01-02 HISTORY — PX: BOTOX INJECTION: SHX5754

## 2023-01-02 LAB — GLUCOSE, CAPILLARY: Glucose-Capillary: 96 mg/dL (ref 70–99)

## 2023-01-02 SURGERY — CYSTOSCOPY, WITH INJECTION OF BLADDER NECK OR BLADDER WALL
Anesthesia: Monitor Anesthesia Care

## 2023-01-02 MED ORDER — OXYCODONE HCL 5 MG PO TABS: 5.0000 mg | ORAL_TABLET | Freq: Once | ORAL | Status: DC | PRN

## 2023-01-02 MED ORDER — LACTATED RINGERS IV SOLN: INTRAVENOUS | Status: DC

## 2023-01-02 MED ORDER — 0.9 % SODIUM CHLORIDE (POUR BTL) OPTIME
TOPICAL | Status: DC | PRN
  Administered 2023-01-02: 1000 mL

## 2023-01-02 MED ORDER — ACETAMINOPHEN 500 MG PO TABS
1000.0000 mg | ORAL_TABLET | Freq: Once | ORAL | Status: AC
  Administered 2023-01-02: 1000 mg via ORAL
  Filled 2023-01-02: qty 2

## 2023-01-02 MED ORDER — DEXAMETHASONE SODIUM PHOSPHATE 10 MG/ML IJ SOLN
INTRAMUSCULAR | Status: AC
  Filled 2023-01-02: qty 1

## 2023-01-02 MED ORDER — FENTANYL CITRATE PF 50 MCG/ML IJ SOSY
PREFILLED_SYRINGE | INTRAMUSCULAR | Status: AC
  Filled 2023-01-02: qty 1

## 2023-01-02 MED ORDER — PROPOFOL 500 MG/50ML IV EMUL
INTRAVENOUS | Status: DC | PRN
  Administered 2023-01-02: 200 ug/kg/min via INTRAVENOUS

## 2023-01-02 MED ORDER — FENTANYL CITRATE (PF) 100 MCG/2ML IJ SOLN
INTRAMUSCULAR | Status: DC | PRN
  Administered 2023-01-02: 50 ug via INTRAVENOUS

## 2023-01-02 MED ORDER — LIDOCAINE HCL (PF) 2 % IJ SOLN
INTRAMUSCULAR | Status: AC
  Filled 2023-01-02: qty 5

## 2023-01-02 MED ORDER — CHLORHEXIDINE GLUCONATE 0.12 % MT SOLN
15.0000 mL | Freq: Once | OROMUCOSAL | Status: AC
  Administered 2023-01-02: 15 mL via OROMUCOSAL

## 2023-01-02 MED ORDER — PROPOFOL 500 MG/50ML IV EMUL
INTRAVENOUS | Status: AC
  Filled 2023-01-02: qty 100

## 2023-01-02 MED ORDER — SODIUM CHLORIDE 0.9 % IR SOLN
Status: DC | PRN
  Administered 2023-01-02: 3000 mL

## 2023-01-02 MED ORDER — ONDANSETRON HCL 4 MG/2ML IJ SOLN
INTRAMUSCULAR | Status: AC
  Filled 2023-01-02: qty 2

## 2023-01-02 MED ORDER — CEFAZOLIN SODIUM-DEXTROSE 2-4 GM/100ML-% IV SOLN
2.0000 g | Freq: Once | INTRAVENOUS | Status: AC
  Administered 2023-01-02: 2 g via INTRAVENOUS
  Filled 2023-01-02: qty 100

## 2023-01-02 MED ORDER — ONABOTULINUMTOXINA 100 UNITS IJ SOLR
INTRAMUSCULAR | Status: AC
  Filled 2023-01-02: qty 200

## 2023-01-02 MED ORDER — ONABOTULINUMTOXINA 100 UNITS IJ SOLR
INTRAMUSCULAR | Status: DC | PRN
  Administered 2023-01-02: 200 [IU]

## 2023-01-02 MED ORDER — SODIUM CHLORIDE (PF) 0.9 % IJ SOLN
INTRAMUSCULAR | Status: DC | PRN
  Administered 2023-01-02: 20 mL

## 2023-01-02 MED ORDER — ORAL CARE MOUTH RINSE: 15.0000 mL | Freq: Once | OROMUCOSAL | Status: AC

## 2023-01-02 MED ORDER — SODIUM CHLORIDE (PF) 0.9 % IJ SOLN
INTRAMUSCULAR | Status: AC
  Filled 2023-01-02: qty 20

## 2023-01-02 MED ORDER — OXYCODONE HCL 5 MG/5ML PO SOLN: 5.0000 mg | Freq: Once | ORAL | Status: DC | PRN

## 2023-01-02 MED ORDER — FENTANYL CITRATE (PF) 100 MCG/2ML IJ SOLN
INTRAMUSCULAR | Status: AC
  Filled 2023-01-02: qty 2

## 2023-01-02 MED ORDER — FENTANYL CITRATE PF 50 MCG/ML IJ SOSY
25.0000 ug | PREFILLED_SYRINGE | INTRAMUSCULAR | Status: DC | PRN
  Administered 2023-01-02 (×2): 50 ug via INTRAVENOUS

## 2023-01-02 SURGICAL SUPPLY — 18 items
BAG URO CATCHER STRL LF (MISCELLANEOUS) ×2 IMPLANT
CLOTH BEACON ORANGE TIMEOUT ST (SAFETY) ×2 IMPLANT
ELECT REM PT RETURN 15FT ADLT (MISCELLANEOUS) ×2 IMPLANT
GLOVE BIO SURGEON STRL SZ 6.5 (GLOVE) ×2 IMPLANT
GOWN STRL REUS W/ TWL LRG LVL3 (GOWN DISPOSABLE) ×2 IMPLANT
GOWN STRL REUS W/TWL LRG LVL3 (GOWN DISPOSABLE) ×1
KIT TURNOVER KIT A (KITS) ×2 IMPLANT
MANIFOLD NEPTUNE II (INSTRUMENTS) ×2 IMPLANT
NDL ASPIRATION 22 (NEEDLE) ×2 IMPLANT
NDL SAFETY ECLIP 18X1.5 (MISCELLANEOUS) ×2 IMPLANT
NEEDLE ASPIRATION 22 (NEEDLE) ×1 IMPLANT
PACK CYSTO (CUSTOM PROCEDURE TRAY) ×2 IMPLANT
SYR 20ML LL LF (SYRINGE) ×2 IMPLANT
SYR CONTROL 10ML LL (SYRINGE) ×2 IMPLANT
SYSTEM URETHRAL BULK BULKAMID (Female Continence) IMPLANT
TUBING CONNECTING 10 (TUBING) ×2 IMPLANT
TUBING UROLOGY SET (TUBING) ×2 IMPLANT
WATER STERILE IRR 3000ML UROMA (IV SOLUTION) ×2 IMPLANT

## 2023-01-02 NOTE — Interval H&P Note (Signed)
History and Physical Interval Note:  01/02/2023 11:23 AM  Stephanie Barber  has presented today for surgery, with the diagnosis of INCONTINENCE.  The various methods of treatment have been discussed with the patient and family. After consideration of risks, benefits and other options for treatment, the patient has consented to  Procedure(s) with comments: CYSTOSCOPY WITH BULKAMID (N/A) - 45 MINS BOTOX INJECTION (N/A) as a surgical intervention.  The patient's history has been reviewed, patient examined, no change in status, stable for surgery.  I have reviewed the patient's chart and labs.  Questions were answered to the patient's satisfaction.     Sherill Mangen D Reygan Heagle

## 2023-01-02 NOTE — Anesthesia Preprocedure Evaluation (Addendum)
Anesthesia Evaluation  Patient identified by MRN, date of birth, ID band Patient awake    Reviewed: Allergy & Precautions, NPO status , Patient's Chart, lab work & pertinent test results, reviewed documented beta blocker date and time   History of Anesthesia Complications Negative for: history of anesthetic complications  Airway Mallampati: II  TM Distance: >3 FB Neck ROM: Full    Dental  (+) Missing,    Pulmonary sleep apnea , former smoker   Pulmonary exam normal        Cardiovascular hypertension, Pt. on medications and Pt. on home beta blockers + Peripheral Vascular Disease  Normal cardiovascular exam     Neuro/Psych  Headaches Narcolepsy  negative psych ROS   GI/Hepatic Neg liver ROS,GERD  Medicated,,  Endo/Other  diabetes, Type 2    Renal/GU Renal InsufficiencyRenal disease  negative genitourinary   Musculoskeletal  (+) Arthritis ,    Abdominal   Peds  Hematology negative hematology ROS (+)   Anesthesia Other Findings Day of surgery medications reviewed with patient.  Reproductive/Obstetrics negative OB ROS                             Anesthesia Physical Anesthesia Plan  ASA: 2  Anesthesia Plan: MAC   Post-op Pain Management: Minimal or no pain anticipated   Induction:   PONV Risk Score and Plan: 2 and Treatment may vary due to age or medical condition, Propofol infusion and Ondansetron  Airway Management Planned: Natural Airway and Simple Face Mask  Additional Equipment: None  Intra-op Plan:   Post-operative Plan:   Informed Consent: I have reviewed the patients History and Physical, chart, labs and discussed the procedure including the risks, benefits and alternatives for the proposed anesthesia with the patient or authorized representative who has indicated his/her understanding and acceptance.     Dental advisory given  Plan Discussed with: CRNA  Anesthesia  Plan Comments:        Anesthesia Quick Evaluation

## 2023-01-02 NOTE — Anesthesia Postprocedure Evaluation (Signed)
Anesthesia Post Note  Patient: Stephanie Barber  Procedure(s) Performed: CYSTOSCOPY WITH BULKAMID BOTOX INJECTION     Patient location during evaluation: PACU Anesthesia Type: MAC Level of consciousness: awake and alert Pain management: pain level controlled Vital Signs Assessment: post-procedure vital signs reviewed and stable Respiratory status: spontaneous breathing, nonlabored ventilation and respiratory function stable Cardiovascular status: blood pressure returned to baseline Postop Assessment: no apparent nausea or vomiting Anesthetic complications: no   No notable events documented.  Last Vitals:  Vitals:   01/02/23 1345 01/02/23 1400  BP: (!) 159/80 (!) 153/70  Pulse: (!) 51 (!) 49  Resp: 15 10  Temp:  (!) 36.4 C  SpO2: 100% 100%    Last Pain:  Vitals:   01/02/23 1400  TempSrc:   PainSc: [place]

## 2023-01-02 NOTE — Op Note (Signed)
Operative Note   Preoperative diagnosis:  1.  Mixed urinary incontinence   Postoperative diagnosis: 1.  Mixed urinary incontinence   Procedure(s): 1.  Cystoscopy with injection of bulkamid 2.  Cystoscopy with injection of botox 100units   Surgeon: Jacalyn Lefevre, MD   Assistants:  None   Anesthesia:  General   Complications:  None   EBL:  minimal   Specimens: 1. none   Drains/Catheters: 1.  none   Intraoperative findings:   Normal urethra   Indication:  82 yo woman with symptomatic mixed urinary incontinence.   Description of procedure:   After risks and benefits of the procedure discussed with the patient, informed consent was obtained.  The patient was taken to the operating placed in the supine position.  Anesthesia was induced and antibiotics were administered.  The patient was then repositioned in the dorsolithotomy position.  She was prepped and draped in usual sterile fashion a timeout performed with the attending present.  Cystoscopy took place.  The cystoscope was assembled with the Bulkamid system.  It was then placed in the urethral meatus and advanced into the bladder under direct visualization.  Prior cystoscopy had been done which noted normal anatomic landmarks.  These were again verified during cystoscopy today.  100 units of Botox mixed in 10 cc of sterile saline were then injected in standard template over the posterior wall the bladder.  Care was taken to avoid the trigone and ureteral orifices.  The cystoscope was brought back to the bladder neck and the needle was advanced through the needle guide at the 1 o'clock position.  Once it was visualized and advanced it was rotated to the 5 o'clock position.  Bulkamid was then injected until blood was seen.  This was then repeated at the 1 o'clock position in the 7 o'clock position until coaptation was noted.   This concluded the case.  The patient's bladder was left with approximately 200 cc of sterile  saline.  The patient emerged from anesthesia and was transferred the PACU in stable condition.   Plan:  Plan for patient to void in PACU prior to discharge.

## 2023-01-02 NOTE — Transfer of Care (Signed)
Immediate Anesthesia Transfer of Care Note  Patient: Stephanie Barber  Procedure(s) Performed: CYSTOSCOPY WITH BULKAMID BOTOX INJECTION  Patient Location: PACU  Anesthesia Type:MAC  Level of Consciousness: sedated  Airway & Oxygen Therapy: Patient Spontanous Breathing and Patient connected to face mask oxygen  Post-op Assessment: Report given to RN and Post -op Vital signs reviewed and stable  Post vital signs: Reviewed and stable  Last Vitals:  Vitals Value Taken Time  BP    Temp    Pulse    Resp    SpO2      Last Pain:  Vitals:   01/02/23 1118  TempSrc:   PainSc: 0-No pain         Complications: No notable events documented.

## 2023-01-02 NOTE — Discharge Instructions (Addendum)
Cystoscopy with Bulkamid and Botox patient instructions  Following a cystoscopy, a catheter (a flexible rubber tube) is sometimes left in place to empty the bladder. This may cause some discomfort or a feeling that you need to urinate. Your doctor determines the period of time that the catheter will be left in place. You may have bloody urine for two to three days (Call your doctor if the amount of bleeding increases or does not subside).  You may pass blood clots in your urine, especially if you had a biopsy. It is not unusual to pass small blood clots and have some bloody urine a couple of weeks after your cystoscopy. Again, call your doctor if the bleeding does not subside. You may have: Dysuria (painful urination) Frequency (urinating often) Urgency (strong desire to urinate)  It may take 1-2 weeks to notice improvement in symptoms following this procedure.  These symptoms are common especially if medicine is instilled into the bladder or a ureteral stent is placed. Avoiding alcohol and caffeine, such as coffee, tea, and chocolate, may help relieve these symptoms. Drink plenty of water, unless otherwise instructed. Your doctor may also prescribe an antibiotic or other medicine to reduce these symptoms.  Cystoscopy results are available soon after the procedure; biopsy results usually take two to four days. Your doctor will discuss the results of your exam with you. Before you go home, you will be given specific instructions for follow-up care. Special Instructions:   If you are going home with a catheter in place do not take a tub bath until removed by your doctor.   You may resume your normal activities.   Do not drive or operate machinery if you are taking narcotic pain medicine.   Be sure to keep all follow-up appointments with your doctor.   Call Your Doctor If: The catheter is not draining You have severe pain You are unable to urinate You have a fever over 101 You have severe  bleeding

## 2023-01-03 ENCOUNTER — Encounter (HOSPITAL_COMMUNITY): Payer: Self-pay | Admitting: Urology

## 2023-01-16 DIAGNOSIS — R350 Frequency of micturition: Secondary | ICD-10-CM | POA: Diagnosis not present

## 2023-01-26 DIAGNOSIS — R252 Cramp and spasm: Secondary | ICD-10-CM | POA: Diagnosis not present

## 2023-01-26 DIAGNOSIS — N3281 Overactive bladder: Secondary | ICD-10-CM | POA: Diagnosis not present

## 2023-01-26 DIAGNOSIS — N1832 Chronic kidney disease, stage 3b: Secondary | ICD-10-CM | POA: Diagnosis not present

## 2023-01-26 DIAGNOSIS — D509 Iron deficiency anemia, unspecified: Secondary | ICD-10-CM | POA: Diagnosis not present

## 2023-02-16 DIAGNOSIS — D500 Iron deficiency anemia secondary to blood loss (chronic): Secondary | ICD-10-CM | POA: Diagnosis not present

## 2023-02-16 DIAGNOSIS — D509 Iron deficiency anemia, unspecified: Secondary | ICD-10-CM | POA: Diagnosis not present

## 2023-02-16 DIAGNOSIS — E1149 Type 2 diabetes mellitus with other diabetic neurological complication: Secondary | ICD-10-CM | POA: Diagnosis not present

## 2023-02-16 DIAGNOSIS — N3281 Overactive bladder: Secondary | ICD-10-CM | POA: Diagnosis not present

## 2023-02-16 DIAGNOSIS — N1832 Chronic kidney disease, stage 3b: Secondary | ICD-10-CM | POA: Diagnosis not present

## 2023-02-16 DIAGNOSIS — I10 Essential (primary) hypertension: Secondary | ICD-10-CM | POA: Diagnosis not present

## 2023-02-16 DIAGNOSIS — E7800 Pure hypercholesterolemia, unspecified: Secondary | ICD-10-CM | POA: Diagnosis not present

## 2023-02-16 DIAGNOSIS — F5101 Primary insomnia: Secondary | ICD-10-CM | POA: Diagnosis not present

## 2023-03-27 DIAGNOSIS — D509 Iron deficiency anemia, unspecified: Secondary | ICD-10-CM | POA: Diagnosis not present

## 2023-04-05 DIAGNOSIS — H1045 Other chronic allergic conjunctivitis: Secondary | ICD-10-CM | POA: Diagnosis not present

## 2023-04-05 DIAGNOSIS — H26493 Other secondary cataract, bilateral: Secondary | ICD-10-CM | POA: Diagnosis not present

## 2023-04-05 DIAGNOSIS — Z961 Presence of intraocular lens: Secondary | ICD-10-CM | POA: Diagnosis not present

## 2023-04-05 DIAGNOSIS — E119 Type 2 diabetes mellitus without complications: Secondary | ICD-10-CM | POA: Diagnosis not present

## 2023-04-05 DIAGNOSIS — H0288B Meibomian gland dysfunction left eye, upper and lower eyelids: Secondary | ICD-10-CM | POA: Diagnosis not present

## 2023-04-05 DIAGNOSIS — H0288A Meibomian gland dysfunction right eye, upper and lower eyelids: Secondary | ICD-10-CM | POA: Diagnosis not present

## 2023-05-02 DIAGNOSIS — N3281 Overactive bladder: Secondary | ICD-10-CM | POA: Diagnosis not present

## 2023-05-02 DIAGNOSIS — Z5320 Procedure and treatment not carried out because of patient's decision for unspecified reasons: Secondary | ICD-10-CM | POA: Diagnosis not present

## 2023-05-02 DIAGNOSIS — Z0000 Encounter for general adult medical examination without abnormal findings: Secondary | ICD-10-CM | POA: Diagnosis not present

## 2023-05-02 DIAGNOSIS — I700 Atherosclerosis of aorta: Secondary | ICD-10-CM | POA: Diagnosis not present

## 2023-05-02 DIAGNOSIS — D509 Iron deficiency anemia, unspecified: Secondary | ICD-10-CM | POA: Diagnosis not present

## 2023-05-02 DIAGNOSIS — N1832 Chronic kidney disease, stage 3b: Secondary | ICD-10-CM | POA: Diagnosis not present

## 2023-05-02 DIAGNOSIS — E1149 Type 2 diabetes mellitus with other diabetic neurological complication: Secondary | ICD-10-CM | POA: Diagnosis not present

## 2023-05-02 DIAGNOSIS — I129 Hypertensive chronic kidney disease with stage 1 through stage 4 chronic kidney disease, or unspecified chronic kidney disease: Secondary | ICD-10-CM | POA: Diagnosis not present

## 2023-05-02 DIAGNOSIS — E7800 Pure hypercholesterolemia, unspecified: Secondary | ICD-10-CM | POA: Diagnosis not present

## 2023-05-02 DIAGNOSIS — G4733 Obstructive sleep apnea (adult) (pediatric): Secondary | ICD-10-CM | POA: Diagnosis not present

## 2023-05-02 DIAGNOSIS — E1122 Type 2 diabetes mellitus with diabetic chronic kidney disease: Secondary | ICD-10-CM | POA: Diagnosis not present

## 2023-05-03 ENCOUNTER — Other Ambulatory Visit: Payer: Self-pay | Admitting: Family Medicine

## 2023-05-03 DIAGNOSIS — E2839 Other primary ovarian failure: Secondary | ICD-10-CM

## 2023-05-10 ENCOUNTER — Other Ambulatory Visit: Payer: Self-pay

## 2023-05-10 ENCOUNTER — Ambulatory Visit (INDEPENDENT_AMBULATORY_CARE_PROVIDER_SITE_OTHER): Payer: Medicare Other | Admitting: Orthopedic Surgery

## 2023-05-10 ENCOUNTER — Encounter: Payer: Self-pay | Admitting: Orthopedic Surgery

## 2023-05-10 DIAGNOSIS — M7542 Impingement syndrome of left shoulder: Secondary | ICD-10-CM | POA: Diagnosis not present

## 2023-05-10 DIAGNOSIS — M75122 Complete rotator cuff tear or rupture of left shoulder, not specified as traumatic: Secondary | ICD-10-CM

## 2023-05-10 DIAGNOSIS — M4722 Other spondylosis with radiculopathy, cervical region: Secondary | ICD-10-CM | POA: Diagnosis not present

## 2023-05-10 DIAGNOSIS — M542 Cervicalgia: Secondary | ICD-10-CM | POA: Diagnosis not present

## 2023-05-10 NOTE — Progress Notes (Signed)
Office Visit Note   Patient: Stephanie Barber           Date of Birth: 19-Oct-1941           MRN: [id number] Visit Date: 05/10/2023 Requested by: No referring provider defined for this encounter. PCP: Elias Else, MD (Inactive)  Subjective: Chief Complaint  Patient presents with   Left Shoulder - Pain    HPI: Stephanie Barber is a 82 y.o. female who presents to the office reporting left shoulder pain which has been chronic.  The pain has worsened over the past 4 weeks.  She had a subacromial injection done 3 months ago by Dr. Ophelia Charter which gave her 1 month of relief.  She does report continued and recurrent severe pain in the shoulder with decreased range of motion and pain that wakes her from sleep at night.  Denies any numbness and tingling but the pain does radiate down to the wrist.  She also reports new neck pain on the left side.  Also describes scapular pain.  Uses extra strength Tylenol without much relief.  Patient states that injections in her shoulder do help but the pain comes back worse.  Symptoms are worse with overhead motion.  Patient did have MRI of her cervical spine in December 2023 which showed moderate left C5-6 and C7-T1 neuroforaminal stenosis.Marland Kitchen  MRI of the left shoulder from 2016 did show medium size tear of the supraspinatus with about 1-1/2 cm of retraction.              ROS: All systems reviewed are negative as they relate to the chief complaint within the history of present illness.  Patient denies fevers or chills.  Assessment & Plan: Visit Diagnoses:  1. Impingement syndrome of left shoulder   2. Other spondylosis with radiculopathy, cervical region   3. Cervicalgia     Plan: Impression is left shoulder pain which could be worsening of rotator cuff pathology as well as referred pain from the neck.  I think both components may be at play here.  Patient needs MRI arthrogram of that left shoulder to evaluate progression of the rotator cuff tear with an eye  towards possible surgical intervention depending on the amount of tearing.  This could take the shape of either rotator cuff repair which would be unlikely versus reverse shoulder replacement and biceps tenodesis.  In addition we will inject that left shoulder today into the joint under ultrasound guidance.  Would also like to get her set up for epidural steroid injection in the cervical spine to try to gauge how much of her symptoms are coming from the neck versus how much from the shoulder.  Follow-Up Instructions: No follow-ups on file.   Orders:  Orders Placed This Encounter  Procedures   US Guided Needle Placement - No Linked Charges   MR Shoulder Left w/ contrast   Arthrogram   Ambulatory referral to Physical Medicine Rehab   No orders of the defined types were placed in this encounter.     Procedures: Large Joint Inj: L glenohumeral on 05/10/2023 6:20 PM Indications: diagnostic evaluation and pain Details: 22 G 1.5 in needle, ultrasound-guided posterior approach  Arthrogram: No  Medications: 9 mL bupivacaine 0.5 %; 40 mg methylPREDNISolone acetate 40 MG/ML; 5 mL lidocaine 1 % Outcome: tolerated well, no immediate complications Procedure, treatment alternatives, risks and benefits explained, specific risks discussed. Consent was given by the patient. Immediately prior to procedure a time out was called to verify the  correct patient, procedure, equipment, support staff and site/side marked as required. Patient was prepped and draped in the usual sterile fashion.       Clinical Data: No additional findings.  Objective: Vital Signs: There were no vitals taken for this visit.  Physical Exam:  Constitutional: Patient appears well-developed HEENT:  Head: Normocephalic Eyes:EOM are normal Neck: Normal range of motion Cardiovascular: Normal rate Pulmonary/chest: Effort normal Neurologic: Patient is alert Skin: Skin is warm Psychiatric: Patient has normal mood and  affect  Ortho Exam: Ortho exam demonstrates fairly painful left shoulder range of motion but maintained passive range of motion symmetrically between shoulders.  She does have slight weakness to external rotation 4-5 on the left compared to 5 out of 5 on the right.  Subscap strength 5+ out of 5 bilaterally.  5 out of 5 grip EPL FPL interosseous resection extension bicep triceps and deltoid strength no Popeye deformity noted on the left-hand side.  No other masses lymphadenopathy or skin changes noted in that shoulder girdle region.  Cervical spine range of motion has about 20 degrees of extension flexion chin to chest and rotation is about 50 degrees bilaterally.  Specialty Comments:  No specialty comments available.  Imaging: US Guided Needle Placement - No Linked Charges  Result Date: 05/10/2023 Ultrasound imaging demonstrates needle placement into the left shoulder joint with extravasation of fluid and no complicating features    PMFS History: Patient Active Problem List   Diagnosis Date Noted   Impingement syndrome of left shoulder 10/31/2022   Other spondylosis with radiculopathy, cervical region 10/12/2022   GIB (gastrointestinal bleeding) 09/20/2022   Pancreatic insufficiency 09/20/2022   GI bleeding 07/23/2022   Stage 3b chronic kidney disease (CKD) (HCC)    Other spondylosis with radiculopathy, lumbar region 09/19/2021   IDA (iron deficiency anemia) 07/06/2021   Protein-calorie malnutrition, severe 06/21/2021   ABLA (acute blood loss anemia)    Malnutrition of moderate degree 06/16/2021   Hematochezia 06/15/2021   Acute GI bleeding 06/15/2021   Hyperlipidemia associated with type 2 diabetes mellitus (HCC) 06/15/2021   Left-sided headache 06/30/2019   Memory loss 06/30/2019   AKI (acute kidney injury) (HCC) 05/06/2017   Dehydration 05/06/2017   Chest pain 05/06/2017   Wound infection after surgery 01/30/2017   Palpitations 08/24/2016   Noncompliance with CPAP treatment  12/28/2015   Poor compliance with CPAP treatment 10/26/2014   CPAP use counseling 07/28/2014   OSA on CPAP 07/28/2014   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   Hypersomnolent 04/02/2014   Obstructive sleep apnea 04/02/2014   Non-compliance with treatment 03/05/2014   Type 2 diabetes mellitus (HCC) 12/05/2011   Hypertension 12/05/2011   GERD (gastroesophageal reflux disease)    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   Chronic cough 07/26/2011   Past Medical History:  Diagnosis Date   Arthritis    Chronic headache    Complication of anesthesia    allergy to Novocaine    Diabetes mellitus    GERD (gastroesophageal reflux disease)    GI bleed    Heart murmur    High cholesterol    Hyperlipidemia    Hypertension    Intracranial atherosclerosis    per MRA   Migraine    Narcolepsy and cataplexy    OSA    states has not used CPAP in over a year   Peripheral vascular disease (HCC)    Rectal prolapse    Stage 3b chronic kidney disease (CKD) (HCC)    Trigger finger of all digits of  right hand     Family History  Problem Relation Age of Onset   Heart disease Mother    Throat cancer Father    Cancer Father        stomach   Kidney cancer Brother    Heart disease Brother     Past Surgical History:  Procedure Laterality Date   ABDOMINAL HYSTERECTOMY     Fibroids   BACK SURGERY     L4,L5 discectomy   BOTOX INJECTION N/A 01/02/2023   Procedure: BOTOX INJECTION;  Surgeon: Noel Christmas, MD;  Location: [facility];  Service: Urology;  Laterality: N/A;   CARDIAC CATHETERIZATION     CARPAL TUNNEL RELEASE Right 01/18/2017   COLONOSCOPY N/A 07/28/2022   Procedure: COLONOSCOPY;  Surgeon: Vida Rigger, MD;  Location: [facility];  Service: Gastroenterology;  Laterality: N/A;   COLONOSCOPY WITH PROPOFOL N/A 06/17/2021   Procedure: COLONOSCOPY WITH PROPOFOL;  Surgeon: Charlott Rakes, MD;  Location: [facility];  Service: Endoscopy;  Laterality: N/A;   CYSTOSCOPY WITH INJECTION  N/A 01/02/2023   Procedure: CYSTOSCOPY WITH Mariam Dollar;  Surgeon: Noel Christmas, MD;  Location: [facility];  Service: Urology;  Laterality: N/A;  45 MINS   ESOPHAGOGASTRODUODENOSCOPY (EGD) WITH PROPOFOL N/A 06/17/2021   Procedure: ESOPHAGOGASTRODUODENOSCOPY (EGD) WITH PROPOFOL;  Surgeon: Charlott Rakes, MD;  Location: [facility];  Service: Endoscopy;  Laterality: N/A;   KIDNEY SURGERY  1998   Right.  growth removed   POLYPECTOMY  06/17/2021   Procedure: POLYPECTOMY;  Surgeon: Charlott Rakes, MD;  Location: [facility];  Service: Endoscopy;;   POLYPECTOMY  07/28/2022   Procedure: POLYPECTOMY;  Surgeon: Vida Rigger, MD;  Location: [facility];  Service: Gastroenterology;;   rectal prolapse repair  12/05/2011   ROTATOR CUFF REPAIR     Left   SHOULDER ARTHROSCOPY WITH SUBACROMIAL DECOMPRESSION, ROTATOR CUFF REPAIR AND BICEP TENDON REPAIR Left 05/25/2015   Procedure: SHOULDER ARTHROSCOPY WITH SUBACROMIAL DECOMPRESSION, ROTATOR CUFF REPAIR AND BICEP TENODESIS, DEBRIDEMENT. ;  Surgeon: Cammy Copa, MD;  Location: [facility];  Service: Orthopedics;  Laterality: Left;  LEFT SHOULDER ROTATOR CUFF TEAR REPAIR, ARTHROSCOPY, DEBRIDEMENT, BICEPS TENODESIS, SUBACROMIAL DECOMPRESSION.   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  Tobacco Use   Smoking status: Former    Types: Cigarettes    Quit date: 11/13/1980    Years since quitting: 42.5   Smokeless tobacco: Never   Tobacco comments:    quit in 1982  Vaping Use   Vaping Use: Never used  Substance and Sexual Activity   Alcohol use: No    Alcohol/week: 0.0 standard drinks of alcohol   Drug use: No    Sexual activity: Yes

## 2023-05-11 MED ORDER — LIDOCAINE HCL 1 % IJ SOLN
5.0000 mL | INTRAMUSCULAR | Status: AC | PRN
  Administered 2023-05-10: 5 mL

## 2023-05-11 MED ORDER — BUPIVACAINE HCL 0.5 % IJ SOLN
9.0000 mL | INTRAMUSCULAR | Status: AC | PRN
  Administered 2023-05-10: 9 mL via INTRA_ARTICULAR

## 2023-05-11 MED ORDER — METHYLPREDNISOLONE ACETATE 40 MG/ML IJ SUSP
40.0000 mg | INTRAMUSCULAR | Status: AC | PRN
  Administered 2023-05-10: 40 mg via INTRA_ARTICULAR

## 2023-05-29 DIAGNOSIS — E7800 Pure hypercholesterolemia, unspecified: Secondary | ICD-10-CM | POA: Diagnosis not present

## 2023-05-29 DIAGNOSIS — I129 Hypertensive chronic kidney disease with stage 1 through stage 4 chronic kidney disease, or unspecified chronic kidney disease: Secondary | ICD-10-CM | POA: Diagnosis not present

## 2023-05-29 DIAGNOSIS — Z23 Encounter for immunization: Secondary | ICD-10-CM | POA: Diagnosis not present

## 2023-05-29 DIAGNOSIS — N1832 Chronic kidney disease, stage 3b: Secondary | ICD-10-CM | POA: Diagnosis not present

## 2023-06-05 ENCOUNTER — Ambulatory Visit: Payer: Medicare Other | Admitting: Physical Medicine and Rehabilitation

## 2023-06-05 ENCOUNTER — Other Ambulatory Visit: Payer: Self-pay

## 2023-06-05 VITALS — BP 125/62 | HR 57

## 2023-06-05 DIAGNOSIS — M5412 Radiculopathy, cervical region: Secondary | ICD-10-CM | POA: Diagnosis not present

## 2023-06-05 MED ORDER — METHYLPREDNISOLONE ACETATE 80 MG/ML IJ SUSP
80.0000 mg | Freq: Once | INTRAMUSCULAR | Status: AC
  Administered 2023-06-05: 80 mg

## 2023-06-05 NOTE — Progress Notes (Signed)
Functional Pain Scale - descriptive words and definitions  Unmanageable (7)  Pain interferes with normal ADL's/nothing seems to help/sleep is very difficult/active distractions are very difficult to concentrate on. Severe range order  Average Pain 9-10   +Driver, -BT, -Dye Allergies.  Neck pain on left side that radiates into the left shoulder

## 2023-06-05 NOTE — Patient Instructions (Signed)

## 2023-06-06 ENCOUNTER — Other Ambulatory Visit: Payer: Self-pay

## 2023-06-06 ENCOUNTER — Other Ambulatory Visit: Payer: Self-pay | Admitting: Hematology and Oncology

## 2023-06-06 ENCOUNTER — Inpatient Hospital Stay: Payer: Medicare Other | Attending: Hematology and Oncology

## 2023-06-06 DIAGNOSIS — Z79899 Other long term (current) drug therapy: Secondary | ICD-10-CM | POA: Diagnosis not present

## 2023-06-06 DIAGNOSIS — D509 Iron deficiency anemia, unspecified: Secondary | ICD-10-CM | POA: Diagnosis not present

## 2023-06-06 DIAGNOSIS — Z86718 Personal history of other venous thrombosis and embolism: Secondary | ICD-10-CM | POA: Diagnosis not present

## 2023-06-06 DIAGNOSIS — Z87891 Personal history of nicotine dependence: Secondary | ICD-10-CM | POA: Diagnosis not present

## 2023-06-06 DIAGNOSIS — D508 Other iron deficiency anemias: Secondary | ICD-10-CM

## 2023-06-06 LAB — CBC WITH DIFFERENTIAL (CANCER CENTER ONLY)
Abs Immature Granulocytes: 0.02 10*3/uL (ref 0.00–0.07)
Basophils Absolute: 0 10*3/uL (ref 0.0–0.1)
Basophils Relative: 0 %
Eosinophils Absolute: 0 10*3/uL (ref 0.0–0.5)
Eosinophils Relative: 0 %
HCT: 32.4 % — ABNORMAL LOW (ref 36.0–46.0)
Hemoglobin: 11.3 g/dL — ABNORMAL LOW (ref 12.0–15.0)
Immature Granulocytes: 0 %
Lymphocytes Relative: 14 %
Lymphs Abs: 0.8 10*3/uL (ref 0.7–4.0)
MCH: 31.9 pg (ref 26.0–34.0)
MCHC: 34.9 g/dL (ref 30.0–36.0)
MCV: 91.5 fL (ref 80.0–100.0)
Monocytes Absolute: 0.3 10*3/uL (ref 0.1–1.0)
Monocytes Relative: 5 %
Neutro Abs: 4.5 10*3/uL (ref 1.7–7.7)
Neutrophils Relative %: 81 %
Platelet Count: 228 10*3/uL (ref 150–400)
RBC: 3.54 MIL/uL — ABNORMAL LOW (ref 3.87–5.11)
RDW: 13.1 % (ref 11.5–15.5)
WBC Count: 5.6 10*3/uL (ref 4.0–10.5)
nRBC: 0 % (ref 0.0–0.2)

## 2023-06-06 LAB — IRON AND IRON BINDING CAPACITY (CC-WL,HP ONLY)
Iron: 69 ug/dL (ref 28–170)
Saturation Ratios: 21 % (ref 10.4–31.8)
TIBC: 323 ug/dL (ref 250–450)
UIBC: 254 ug/dL (ref 148–442)

## 2023-06-06 LAB — CMP (CANCER CENTER ONLY)
ALT: 15 U/L (ref 0–44)
AST: 16 U/L (ref 15–41)
Albumin: 4.3 g/dL (ref 3.5–5.0)
Alkaline Phosphatase: 53 U/L (ref 38–126)
Anion gap: 8 (ref 5–15)
BUN: 26 mg/dL — ABNORMAL HIGH (ref 8–23)
CO2: 25 mmol/L (ref 22–32)
Calcium: 9.9 mg/dL (ref 8.9–10.3)
Chloride: 109 mmol/L (ref 98–111)
Creatinine: 1.5 mg/dL — ABNORMAL HIGH (ref 0.44–1.00)
GFR, Estimated: 34 mL/min — ABNORMAL LOW (ref >60)
Glucose, Bld: 145 mg/dL — ABNORMAL HIGH (ref 70–99)
Potassium: 4.3 mmol/L (ref 3.5–5.1)
Sodium: 142 mmol/L (ref 135–145)
Total Bilirubin: 0.4 mg/dL (ref 0.3–1.2)
Total Protein: 7.1 g/dL (ref 6.5–8.1)

## 2023-06-06 LAB — RETIC PANEL
Immature Retic Fract: 19.3 % — ABNORMAL HIGH (ref 2.3–15.9)
RBC.: 3.6 MIL/uL — ABNORMAL LOW (ref 3.87–5.11)
Retic Count, Absolute: 55.4 10*3/uL (ref 19.0–186.0)
Retic Ct Pct: 1.5 % (ref 0.4–3.1)
Reticulocyte Hemoglobin: 33.7 pg (ref >27.9)

## 2023-06-06 LAB — FERRITIN: Ferritin: 36 ng/mL (ref 11–307)

## 2023-06-13 ENCOUNTER — Other Ambulatory Visit: Payer: Self-pay

## 2023-06-13 ENCOUNTER — Encounter: Payer: Self-pay | Admitting: Physician Assistant

## 2023-06-13 ENCOUNTER — Encounter: Payer: Self-pay | Admitting: *Deleted

## 2023-06-13 ENCOUNTER — Telehealth: Payer: Self-pay | Admitting: *Deleted

## 2023-06-13 ENCOUNTER — Inpatient Hospital Stay (HOSPITAL_BASED_OUTPATIENT_CLINIC_OR_DEPARTMENT_OTHER): Payer: Medicare Other | Admitting: Hematology and Oncology

## 2023-06-13 DIAGNOSIS — Z79899 Other long term (current) drug therapy: Secondary | ICD-10-CM | POA: Diagnosis not present

## 2023-06-13 DIAGNOSIS — Z87891 Personal history of nicotine dependence: Secondary | ICD-10-CM | POA: Diagnosis not present

## 2023-06-13 DIAGNOSIS — D500 Iron deficiency anemia secondary to blood loss (chronic): Secondary | ICD-10-CM

## 2023-06-13 DIAGNOSIS — N1832 Chronic kidney disease, stage 3b: Secondary | ICD-10-CM | POA: Diagnosis not present

## 2023-06-13 DIAGNOSIS — Z86718 Personal history of other venous thrombosis and embolism: Secondary | ICD-10-CM | POA: Diagnosis not present

## 2023-06-13 DIAGNOSIS — D509 Iron deficiency anemia, unspecified: Secondary | ICD-10-CM | POA: Diagnosis not present

## 2023-06-13 NOTE — Progress Notes (Signed)
LUNDIN KOPELMAN - 82 y.o. female MRN [id number]  Date of birth: [birth year]  Office Visit Note: Visit Date: 06/05/2023 PCP: Elias Else, MD (Inactive) Referred by: Cammy Copa, MD  Subjective: Chief Complaint  Patient presents with   Neck - Pain   HPI:  Stephanie Barber is a 82 y.o. female who comes in today at the request of Dr. Burnard Bunting for planned Left C7-T1 Cervical Interlaminar epidural steroid injection with fluoroscopic guidance.  The patient has failed conservative care including home exercise, medications, time and activity modification.  This injection will be diagnostic and hopefully therapeutic.  Please see requesting physician notes for further details and justification.   ROS Otherwise per HPI.  Assessment & Plan: Visit Diagnoses:    ICD-10-CM   1. Cervical radiculopathy  M54.12 XR C-ARM NO REPORT    Epidural Steroid injection    methylPREDNISolone acetate (DEPO-MEDROL) injection 80 mg      Plan: No additional findings.   Meds & Orders:  Meds ordered this encounter  Medications   methylPREDNISolone acetate (DEPO-MEDROL) injection 80 mg    Orders Placed This Encounter  Procedures   XR C-ARM NO REPORT   Epidural Steroid injection    Follow-up: Return for visit to requesting provider as needed.   Procedures: No procedures performed  Cervical Epidural Steroid Injection - Interlaminar Approach with Fluoroscopic Guidance  Patient: Stephanie Barber      Date of Birth: [birth year] MRN: [id number] PCP: Elias Else, MD (Inactive)      Visit Date: 06/05/2023   Universal Protocol:    Date/Time: 07/31/246:07 AM  Consent Given By: the patient  Position: PRONE  Additional Comments: Vital signs were monitored before and after the procedure. Patient was prepped and draped in the usual sterile fashion. The correct patient, procedure, and site was verified.   Injection Procedure Details:   Procedure diagnoses: Cervical radiculopathy  [M54.12]    Meds Administered:  Meds ordered this encounter  Medications   methylPREDNISolone acetate (DEPO-MEDROL) injection 80 mg     Laterality: Left  Location/Site: C7-T1  Needle: 3.5 in., 20 ga. Tuohy  Needle Placement: Paramedian epidural space  Findings:  -Comments: Excellent flow of contrast into the epidural space.  Procedure Details: Using a paramedian approach from the side mentioned above, the region overlying the inferior lamina was localized under fluoroscopic visualization and the soft tissues overlying this structure were infiltrated with 4 ml. of 1% Lidocaine without Epinephrine. A # 20 gauge, Tuohy needle was inserted into the epidural space using a paramedian approach.  The epidural space was localized using loss of resistance along with contralateral oblique bi-planar fluoroscopic views.  After negative aspirate for air, blood, and CSF, a 2 ml. volume of Isovue-250 was injected into the epidural space and the flow of contrast was observed. Radiographs were obtained for documentation purposes.   The injectate was administered into the level noted above.  Additional Comments:  No complications occurred Dressing: 2 x 2 sterile gauze and Band-Aid    Post-procedure details: Patient was observed during the procedure. Post-procedure instructions were reviewed.  Patient left the clinic in stable condition.   Clinical History: MRI CERVICAL SPINE WITHOUT CONTRAST   TECHNIQUE: Multiplanar, multisequence MR imaging of the cervical spine was performed. No intravenous contrast was administered.   COMPARISON:  11/15/2016   FINDINGS: Alignment: Physiologic.   Vertebrae: No fracture, evidence of discitis, or bone lesion.   Cord: Normal signal and morphology.   Posterior Fossa, vertebral arteries, paraspinal  tissues: Negative.   Disc levels:   C1-2: Unremarkable.   C2-3: Small central disc protrusion. There is no spinal canal stenosis. No neural foraminal  stenosis.   C3-4: Small disc bulge with uncovertebral hypertrophy. Unchanged mild spinal canal stenosis. Mild bilateral neural foraminal stenosis.   C4-5: Small disc bulge with uncovertebral hypertrophy, slightly worsened. Worsened mild spinal canal stenosis. Mild bilateral neural foraminal stenosis.   C5-6: Small central disc protrusion with bilateral uncovertebral hypertrophy. Mild spinal canal stenosis. Unchanged moderate left neural foraminal stenosis.   C6-7: Unchanged small disc bulge. There is no spinal canal stenosis. No neural foraminal stenosis.   C7-T1: Unchanged left facet hypertrophy. There is no spinal canal stenosis. Unchanged moderate left neural foraminal stenosis.   IMPRESSION: 1. Slight worsening of mild spinal canal stenosis at C4-5. 2. Unchanged mild spinal canal stenosis at C3-4 and C5-6. 3. Unchanged moderate left C5-6 and C7-T1 neural foraminal stenosis. 4. Otherwise mild multilevel foraminal stenosis, unchanged.     Electronically Signed   By: Deatra Robinson M.D.   On: 11/13/2022 03:58     Objective:  VS:  HT:    WT:   BMI:     BP:125/62  HR:(!) 57bpm  TEMP: ( )  RESP:  Physical Exam Vitals and nursing note reviewed.  Constitutional:      General: She is not in acute distress.    Appearance: Normal appearance. She is not ill-appearing.  HENT:     Head: Normocephalic and atraumatic.     Right Ear: External ear normal.     Left Ear: External ear normal.  Eyes:     Extraocular Movements: Extraocular movements intact.  Cardiovascular:     Rate and Rhythm: Normal rate.     Pulses: Normal pulses.  Musculoskeletal:     Cervical back: Tenderness present. No rigidity.     Right lower leg: No edema.     Left lower leg: No edema.     Comments: Patient has good strength in the upper extremities including 5 out of 5 strength in wrist extension long finger flexion and APB.  There is no atrophy of the hands intrinsically.  There is a negative  Hoffmann's test.   Lymphadenopathy:     Cervical: No cervical adenopathy.  Skin:    Findings: No erythema, lesion or rash.  Neurological:     General: No focal deficit present.     Mental Status: She is alert and oriented to person, place, and time.     Sensory: No sensory deficit.     Motor: No weakness or abnormal muscle tone.     Coordination: Coordination normal.  Psychiatric:        Mood and Affect: Mood normal.        Behavior: Behavior normal.      Imaging: No results found.

## 2023-06-13 NOTE — Telephone Encounter (Signed)
Referral fax'd to [facility] for pt with CKD stage 3. Received confirmation of fax receipt.

## 2023-06-13 NOTE — Progress Notes (Signed)
[facility] Telephone:([phone]) [phone]   [phone]  PROGRESS NOTE  Patient Care Team: Stephanie Else, MD (Inactive) as PCP - General (Family Medicine) Stephanie Redbird, MD as Consulting Physician (Gastroenterology) Barber, Stephanie Krabbe, MD as Consulting Physician (Pulmonary Disease)  Hematological/Oncological History  #Iron Deficiency Anemia of GI Origin 1) 05/24/2021: Labs from PCP, Dr. Elias Barber showed WBC 5.0, Hgb 8.7 (L), MCV 85.2, Plt 277, Ferritin 6.2 (L), Iron 24 (L), Transferrin 315, Iron saturation 6%, TIBC 441.   2)Admitted from 06/15/2021-06/17/2021 for multiple episodes of rectal bleeding. Hgb was 8.3, Fecal occult blood test was positive. Patient was taking Xarelto for history of DVT, last dose 06/14/2021. CT scan showed some diverticulosis with no active bleeding. Underwent EGD and colonoscopy with no source of bleeding. Felt to be diverticular bleeding exacerbated by use of anticoagulation. Xarelto was stopped and received 3 units of pRBC. Hgb was 11.8.  3) Readmitted from 06/20/2021-06/23/2021 for drop in Hgb from 11.8 to 10.7. Recommended to initiate ferrous sulfate 325 mg once daily and to follow up with PCP.   4) 07/06/2021: Establish care with Stephanie Kaufmann PA-C  5) 07/27/2021-08/27/2021: Received IV venofer x 5 doses  HISTORY OF PRESENTING ILLNESS:  Stephanie Barber 82 y.o. female returns to the clinic for follow-up for iron deficiency anemia. She was last seen in the office on 12/13/2022.   [name] reports she has been well overall in the interim since her last visit.  She reports she is much better than she felt 6 months ago.  She does have some occasional pains in her shoulder and legs.  She reports she was having some loose stools other day but thinks it may have been due to coffee.  She also is concerned that she may have issues with lactose as she has been having trouble with cheese and milk products.  Her energy levels are good she reports she is  about 95% of full energy.  Unfortunately her appetite has been poor and she has been eating poorly.  She reports she "just eats to take her medicines".  She is not eating much in the way of red meat but her husband notes they did have a pot roast last night.  She is taking her iron pills faithfully and is not having any stomach upset.  She denies any lightheadedness, dizziness, shortness of breath.  Her weight however has increased up to 118 pounds from 112 pounds.  She denies fevers, chills, night sweats, shortness of breath, chest pain or cough. She has no other complaints. Rest of the 10 point ROS  is below.    MEDICAL HISTORY:  Past Medical History:  Diagnosis Date   Arthritis    Chronic headache    Complication of anesthesia    allergy to Novocaine    Diabetes mellitus    GERD (gastroesophageal reflux disease)    GI bleed    Heart murmur    High cholesterol    Hyperlipidemia    Hypertension    Intracranial atherosclerosis    per MRA   Migraine    Narcolepsy and cataplexy    OSA    states has not used CPAP in over a year   Peripheral vascular disease (HCC)    Rectal prolapse    Stage 3b chronic kidney disease (CKD) (HCC)    Trigger finger of all digits of right hand     SURGICAL HISTORY: Past Surgical History:  Procedure Laterality Date   ABDOMINAL HYSTERECTOMY  Fibroids   BACK SURGERY     L4,L5 discectomy   BOTOX INJECTION N/A 01/02/2023   Procedure: BOTOX INJECTION;  Surgeon: Stephanie Christmas, MD;  Location: [facility];  Service: Urology;  Laterality: N/A;   CARDIAC CATHETERIZATION     CARPAL TUNNEL RELEASE Right 01/18/2017   COLONOSCOPY N/A 07/28/2022   Procedure: COLONOSCOPY;  Surgeon: Stephanie Rigger, MD;  Location: [facility];  Service: Gastroenterology;  Laterality: N/A;   COLONOSCOPY WITH PROPOFOL N/A 06/17/2021   Procedure: COLONOSCOPY WITH PROPOFOL;  Surgeon: Stephanie Rakes, MD;  Location: [facility];  Service: Endoscopy;  Laterality: N/A;   CYSTOSCOPY WITH  INJECTION N/A 01/02/2023   Procedure: CYSTOSCOPY WITH Stephanie Barber;  Surgeon: Stephanie Christmas, MD;  Location: [facility];  Service: Urology;  Laterality: N/A;  45 MINS   ESOPHAGOGASTRODUODENOSCOPY (EGD) WITH PROPOFOL N/A 06/17/2021   Procedure: ESOPHAGOGASTRODUODENOSCOPY (EGD) WITH PROPOFOL;  Surgeon: Stephanie Rakes, MD;  Location: [facility];  Service: Endoscopy;  Laterality: N/A;   KIDNEY SURGERY  1998   Right.  growth removed   POLYPECTOMY  06/17/2021   Procedure: POLYPECTOMY;  Surgeon: Stephanie Rakes, MD;  Location: [facility];  Service: Endoscopy;;   POLYPECTOMY  07/28/2022   Procedure: POLYPECTOMY;  Surgeon: Stephanie Rigger, MD;  Location: [facility];  Service: Gastroenterology;;   rectal prolapse repair  12/05/2011   ROTATOR CUFF REPAIR     Left   SHOULDER ARTHROSCOPY WITH SUBACROMIAL DECOMPRESSION, ROTATOR CUFF REPAIR AND BICEP TENDON REPAIR Left 05/25/2015   Procedure: SHOULDER ARTHROSCOPY WITH SUBACROMIAL DECOMPRESSION, ROTATOR CUFF REPAIR AND BICEP TENODESIS, DEBRIDEMENT. ;  Surgeon: Stephanie Copa, MD;  Location: [facility];  Service: Orthopedics;  Laterality: Left;  LEFT SHOULDER ROTATOR CUFF TEAR REPAIR, ARTHROSCOPY, DEBRIDEMENT, BICEPS TENODESIS, SUBACROMIAL DECOMPRESSION.   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  Tobacco Use   Smoking status: Former    Current packs/day: 0.00    Types: Cigarettes    Quit date: 11/13/1980    Years since quitting: 42.6    Smokeless tobacco: Never   Tobacco comments:    quit in 1982  Vaping Use   Vaping status: Never Used  Substance and Sexual Activity   Alcohol use: No    Alcohol/week: 0.0 standard drinks of alcohol   Drug use: No   Sexual activity: Yes  Other Topics Concern   Not on file  Social History Narrative   21 st January 2014 , patient underwent PS and MSLT - MSLT had one  SREMs and an average time to fall asleep of  *.8 minutes , her Ewort is 20 points,: facit: this patient has severe hypersomnia and is at risk when driving. medication in form ogf nuvigil smaples had been dispensed to her but she ha not yet taken it. Her  since SREM onset is raising the suspecion  of narcolepsy with her clinical symptoms of her EDS , score  and vivid  dreams, sleep hallucinations and  dream intrusion,  and reported cataplexy . In detail -discussion of diagnosis and treatment  takes place today , first with nuvigil and if insufficient, with XYREM. Her CPAP treats her OSA very well, and she uses it 6 hours  or more each night,  residual AHI of 1.1  would not allow for OSA to be still explaining this degree of sleepiness. 2 downloads were reviewed. labs reviewed.    Patient is married [name]) and lives at home with her husband and grandchild.   Patient has four children   Patient is retired.   Patient has a college education.   Patient is right-handed.   Patient does not drink any caffeine.      Social Determinants of Health   Financial Resource Strain: Not on file  Food Insecurity: No Food Insecurity (09/20/2022)   Hunger Vital Sign    Worried About Running Out of Food in the Last Year: Never true    Ran Out of Food in the Last Year: Never true  Transportation Needs: No Transportation Needs (09/20/2022)   PRAPARE - Administrator, Civil Service (Medical): No    Lack of Transportation (Non-Medical): No  Physical Activity: Not on file  Stress: Not on file  Social Connections: Not on file  Intimate  Partner Violence: Not At Risk (09/20/2022)   Humiliation, Afraid, Rape, and Kick questionnaire    Fear of Current or Ex-Partner: No    Emotionally Abused: No    Physically Abused: No    Sexually Abused: No    FAMILY HISTORY: Family History  Problem Relation Age of Onset   Heart disease Mother    Throat cancer Father    Cancer Father        stomach   Kidney cancer Brother    Heart disease Brother     ALLERGIES:  is allergic to morphine and codeine and lactose intolerance (gi).  MEDICATIONS:  Current Outpatient Medications  Medication Sig Dispense Refill   acetaminophen (TYLENOL) 500 MG tablet Take 1,000 mg by mouth every 6 (six) hours as needed for moderate pain.     acetaminophen (TYLENOL) 650 MG CR tablet Take 1,300 mg by mouth every 8 (eight) hours as needed for pain.     amLODipine (NORVASC) 10 MG tablet Take 10 mg by mouth in the morning.     Apoaequorin (PREVAGEN PO) Take 1 capsule by mouth daily.     carboxymethylcellulose (REFRESH PLUS) 0.5 % SOLN Place 1 drop into both eyes daily as needed (dry/irritated eyes).     cyanocobalamin (VITAMIN B12) 1000 MCG tablet Take 1,000 mcg by mouth daily.     escitalopram (LEXAPRO) 10 MG tablet 1 tablet Orally twice a day for 30 days     Homeopathic Products (THERAWORX RELIEF EX) Apply 1 Application topically daily as needed (pain).     lipase/protease/amylase (CREON) 36000 UNITS CPEP capsule Take 36,000-72,000 Units by mouth See admin instructions. Take 38756 units with meals and 36000 units with snacks     Menthol, Topical Analgesic, (BIOFREEZE EX) Apply 1 Application topically daily as needed (pain).     metoprolol succinate (TOPROL-XL) 50 MG 24 hr tablet Take 50 mg by mouth in the morning.     pantoprazole (PROTONIX) 40 MG tablet Take 1 tablet (40 mg total) by mouth daily. (Patient not taking: Reported on 12/20/2022) 30 tablet 11   TURMERIC PO Take 1 capsule by mouth daily.     No current facility-administered medications for this  visit.  REVIEW OF SYSTEMS:   Constitutional: ( - ) fevers, ( - )  chills , ( - ) night sweats Eyes: ( - ) blurriness of vision, ( - ) double vision, ( - ) watery eyes Ears, nose, mouth, throat, and face: ( - ) mucositis, ( - ) sore throat Respiratory: ( - ) cough, ( - ) dyspnea, ( - ) wheezes Cardiovascular: ( - ) palpitation, ( - ) chest discomfort, ( - ) lower extremity swelling Gastrointestinal:  ( - ) nausea, ( - ) heartburn, ( - ) change in bowel habits Skin: ( - ) abnormal skin rashes Lymphatics: ( - ) new lymphadenopathy, ( - ) easy bruising Neurological: ( - ) numbness, ( - ) tingling, ( - ) new weaknesses Behavioral/Psych: ( - ) mood change, ( - ) new changes  All other systems were reviewed with the patient and are negative.  PHYSICAL EXAMINATION: ECOG PERFORMANCE STATUS: 0 - Asymptomatic  There were no vitals filed for this visit.  There were no vitals filed for this visit.   GENERAL: well appearing female in NAD  SKIN: skin color, texture, turgor are normal, no rashes or significant lesions EYES: conjunctiva are pink and non-injected, sclera clear.  LUNGS: clear to auscultation and percussion with normal breathing effort HEART: regular rate & rhythm and no murmurs and no lower extremity edema Musculoskeletal: no cyanosis of digits and no clubbing  PSYCH: alert & oriented x 3, fluent speech NEURO: no focal motor/sensory deficits  LABORATORY DATA:  I have reviewed the data as listed    Latest Ref Rng & Units 06/06/2023   11:10 AM 12/13/2022    2:37 PM 09/28/2022   12:46 PM  CBC  WBC 4.0 - 10.5 K/uL 5.6  3.4    Hemoglobin 12.0 - 15.0 g/dL 28.4  13.2  9.3   Hematocrit 36.0 - 46.0 % 32.4  36.6  28.5   Platelets 150 - 400 K/uL 228  203         Latest Ref Rng & Units 06/06/2023   11:10 AM 12/13/2022    2:37 PM 09/28/2022    7:05 AM  CMP  Glucose 70 - 99 mg/dL 440  102  725   BUN 8 - 23 mg/dL 26  28  27    Creatinine 0.44 - 1.00 mg/dL 3.66  4.40  3.47   Sodium  135 - 145 mmol/L 142  142  141   Potassium 3.5 - 5.1 mmol/L 4.3  4.4  4.2   Chloride 98 - 111 mmol/L 109  108  116   CO2 22 - 32 mmol/L 25  24  19    Calcium 8.9 - 10.3 mg/dL 9.9  9.3  9.2   Total Protein 6.5 - 8.1 g/dL 7.1  8.2  6.8   Total Bilirubin 0.3 - 1.2 mg/dL 0.4  0.4  0.5   Alkaline Phos 38 - 126 U/L 53  56  60   AST 15 - 41 U/L 16  29  22    ALT 0 - 44 U/L 15  28  17      RADIOGRAPHIC STUDIES: I have personally reviewed the radiological images as listed and agreed with the findings in the report. XR C-ARM NO REPORT  Result Date: 06/05/2023 Please see Notes tab for imaging impression.  Epidural Steroid injection  Result Date: 06/05/2023 Tyrell Antonio, MD     06/13/2023  6:07 AM Cervical Epidural Steroid Injection - Interlaminar Approach with Fluoroscopic Guidance Patient: Stephanie Barber  Date of Birth: [birth year] MRN: [id number] PCP: Stephanie Else, MD (Inactive)     Visit Date: 06/05/2023  Universal Protocol:   Date/Time: 07/31/246:07 AM Consent Given By: the patient Position: PRONE Additional Comments: Vital signs were monitored before and after the procedure. Patient was prepped and draped in the usual sterile fashion. The correct patient, procedure, and site was verified. Injection Procedure Details: Procedure diagnoses: Cervical radiculopathy [M54.12]  Meds Administered: Meds ordered this encounter Medications  methylPREDNISolone acetate (DEPO-MEDROL) injection 80 mg  Laterality: Left Location/Site: C7-T1 Needle: 3.5 in., 20 ga. Tuohy Needle Placement: Paramedian epidural space Findings:  -Comments: Excellent flow of contrast into the epidural space. Procedure Details: Using a paramedian approach from the side mentioned above, the region overlying the inferior lamina was localized under fluoroscopic visualization and the soft tissues overlying this structure were infiltrated with 4 ml. of 1% Lidocaine without Epinephrine. A # 20 gauge, Tuohy needle was inserted into the epidural  space using a paramedian approach. The epidural space was localized using loss of resistance along with contralateral oblique bi-planar fluoroscopic views.  After negative aspirate for air, blood, and CSF, a 2 ml. volume of Isovue-250 was injected into the epidural space and the flow of contrast was observed. Radiographs were obtained for documentation purposes. The injectate was administered into the level noted above. Additional Comments: No complications occurred Dressing: 2 x 2 sterile gauze and Band-Aid  Post-procedure details: Patient was observed during the procedure. Post-procedure instructions were reviewed. Patient left the clinic in stable condition.   ASSESSMENT & PLAN SHARENDA CLISHAM is a 82 y.o. female returns for a follow up for iron deficiency anemia.   #Iron deficiency anemia 2/2 GI bleeding: --Under the care of [facility] with Celso Amy PA-C. Most recent EGD and colonoscopy from 06/17/2021 with no signs of active bleeding. Requested follow up due to recent episode of rectal bleeding in May 2023.  --Recommend to continue to incorporate iron rich foods into diet.  --Received IV venofer x 5 doses from 07/27/2021-08/27/2021.  --Labs today shows Hgb 11.3, white blood cell count 5.6, MCV 91.5, and platelets of 228 --continue ferrous sulfate 325 mg once daily with a source of vitamin C --RTC in 6 months with repeat labs.   # CKD stage 3 -- Patient creatinine at 1.5 today, chronically elevated. -- Patient is currently under the care of urology for urinary incontinence. -- Patient notes that she does not have a nephrologist.  Will make a referral to nephrology today.  # DVT of right femoral vein: --Diagnosed in 06/03/2019. CTA negative for PE. Started on Xarelto.  --Discontinued with during  hospitalization for GI bleed.   #Appetite loss/lethargy: --weight stable at 112 lbs.  --Continue to monitor.   No orders of the defined types were placed in this encounter.   All questions  were answered. The patient knows to call the clinic with any problems, questions or concerns.  I have spent a total of 30 minutes minutes of face-to-face and non-face-to-face time, preparing to see the patient, performing a medically appropriate examination, counseling and educating the patient, documenting clinical information in the electronic health record, and care coordination.    Ulysees Barns, MD Department of Hematology/Oncology [facility] at [facility] Phone: [phone] Pager: [phone] Email: [email or web address]

## 2023-06-13 NOTE — Procedures (Signed)
Cervical Epidural Steroid Injection - Interlaminar Approach with Fluoroscopic Guidance  Patient: Stephanie Barber      Date of Birth: 1941-08-12 MRN: [id number] PCP: Elias Else, MD (Inactive)      Visit Date: 06/05/2023   Universal Protocol:    Date/Time: 07/31/246:07 AM  Consent Given By: the patient  Position: PRONE  Additional Comments: Vital signs were monitored before and after the procedure. Patient was prepped and draped in the usual sterile fashion. The correct patient, procedure, and site was verified.   Injection Procedure Details:   Procedure diagnoses: Cervical radiculopathy [M54.12]    Meds Administered:  Meds ordered this encounter  Medications   methylPREDNISolone acetate (DEPO-MEDROL) injection 80 mg     Laterality: Left  Location/Site: C7-T1  Needle: 3.5 in., 20 ga. Tuohy  Needle Placement: Paramedian epidural space  Findings:  -Comments: Excellent flow of contrast into the epidural space.  Procedure Details: Using a paramedian approach from the side mentioned above, the region overlying the inferior lamina was localized under fluoroscopic visualization and the soft tissues overlying this structure were infiltrated with 4 ml. of 1% Lidocaine without Epinephrine. A # 20 gauge, Tuohy needle was inserted into the epidural space using a paramedian approach.  The epidural space was localized using loss of resistance along with contralateral oblique bi-planar fluoroscopic views.  After negative aspirate for air, blood, and CSF, a 2 ml. volume of Isovue-250 was injected into the epidural space and the flow of contrast was observed. Radiographs were obtained for documentation purposes.   The injectate was administered into the level noted above.  Additional Comments:  No complications occurred Dressing: 2 x 2 sterile gauze and Band-Aid    Post-procedure details: Patient was observed during the procedure. Post-procedure instructions were  reviewed.  Patient left the clinic in stable condition.

## 2023-06-14 ENCOUNTER — Telehealth: Payer: Self-pay | Admitting: Hematology and Oncology

## 2023-06-26 ENCOUNTER — Encounter: Payer: Self-pay | Admitting: Physician Assistant

## 2023-07-06 ENCOUNTER — Other Ambulatory Visit: Payer: Medicare Other

## 2023-07-19 DIAGNOSIS — M5412 Radiculopathy, cervical region: Secondary | ICD-10-CM | POA: Diagnosis not present

## 2023-07-19 DIAGNOSIS — M180 Bilateral primary osteoarthritis of first carpometacarpal joints: Secondary | ICD-10-CM | POA: Diagnosis not present

## 2023-07-19 DIAGNOSIS — G5602 Carpal tunnel syndrome, left upper limb: Secondary | ICD-10-CM | POA: Diagnosis not present

## 2023-07-19 DIAGNOSIS — M1812 Unilateral primary osteoarthritis of first carpometacarpal joint, left hand: Secondary | ICD-10-CM | POA: Diagnosis not present

## 2023-07-26 ENCOUNTER — Ambulatory Visit
Admission: RE | Admit: 2023-07-26 | Discharge: 2023-07-26 | Disposition: A | Discharge status: Home / Self Care | Payer: Medicare Other | Source: Ambulatory Visit | Attending: Orthopedic Surgery | Admitting: Orthopedic Surgery

## 2023-07-26 DIAGNOSIS — M7542 Impingement syndrome of left shoulder: Secondary | ICD-10-CM

## 2023-07-26 DIAGNOSIS — M67814 Other specified disorders of tendon, left shoulder: Secondary | ICD-10-CM | POA: Diagnosis not present

## 2023-07-26 DIAGNOSIS — M75122 Complete rotator cuff tear or rupture of left shoulder, not specified as traumatic: Secondary | ICD-10-CM | POA: Diagnosis not present

## 2023-07-26 MED ORDER — IOPAMIDOL (ISOVUE-M 200) INJECTION 41%
10.0000 mL | Freq: Once | INTRAMUSCULAR | Status: AC
  Administered 2023-07-26: 10 mL via INTRA_ARTICULAR

## 2023-08-01 DIAGNOSIS — E785 Hyperlipidemia, unspecified: Secondary | ICD-10-CM | POA: Diagnosis not present

## 2023-08-01 DIAGNOSIS — N1832 Chronic kidney disease, stage 3b: Secondary | ICD-10-CM | POA: Diagnosis not present

## 2023-08-01 DIAGNOSIS — R631 Polydipsia: Secondary | ICD-10-CM | POA: Diagnosis not present

## 2023-08-01 DIAGNOSIS — I129 Hypertensive chronic kidney disease with stage 1 through stage 4 chronic kidney disease, or unspecified chronic kidney disease: Secondary | ICD-10-CM | POA: Diagnosis not present

## 2023-08-01 DIAGNOSIS — D649 Anemia, unspecified: Secondary | ICD-10-CM | POA: Diagnosis not present

## 2023-08-01 DIAGNOSIS — E1122 Type 2 diabetes mellitus with diabetic chronic kidney disease: Secondary | ICD-10-CM | POA: Diagnosis not present

## 2023-08-13 ENCOUNTER — Telehealth: Payer: Self-pay

## 2023-08-13 NOTE — Telephone Encounter (Signed)
-----   Message from Burnard Bunting sent at 08/13/2023  5:03 PM EDT ----- Needs fu it appears thx

## 2023-08-13 NOTE — Progress Notes (Signed)
Needs fu it appears thx

## 2023-08-13 NOTE — Telephone Encounter (Signed)
Please schedule follow up to review MRI scan.

## 2023-08-24 ENCOUNTER — Ambulatory Visit (INDEPENDENT_AMBULATORY_CARE_PROVIDER_SITE_OTHER): Payer: Medicare Other | Admitting: Orthopedic Surgery

## 2023-08-24 DIAGNOSIS — M5412 Radiculopathy, cervical region: Secondary | ICD-10-CM

## 2023-08-25 ENCOUNTER — Emergency Department (HOSPITAL_COMMUNITY): Payer: Medicare Other

## 2023-08-25 ENCOUNTER — Encounter (HOSPITAL_COMMUNITY): Payer: Self-pay

## 2023-08-25 ENCOUNTER — Encounter: Payer: Self-pay | Admitting: Orthopedic Surgery

## 2023-08-25 ENCOUNTER — Other Ambulatory Visit: Payer: Self-pay

## 2023-08-25 ENCOUNTER — Emergency Department (HOSPITAL_COMMUNITY)
Admission: EM | Admit: 2023-08-25 | Discharge: 2023-08-25 | Disposition: A | Discharge status: Home / Self Care | Payer: Medicare Other | Attending: Emergency Medicine | Admitting: Emergency Medicine

## 2023-08-25 DIAGNOSIS — F0390 Unspecified dementia without behavioral disturbance: Secondary | ICD-10-CM | POA: Diagnosis not present

## 2023-08-25 DIAGNOSIS — S0993XA Unspecified injury of face, initial encounter: Secondary | ICD-10-CM | POA: Diagnosis present

## 2023-08-25 DIAGNOSIS — M25512 Pain in left shoulder: Secondary | ICD-10-CM | POA: Diagnosis not present

## 2023-08-25 DIAGNOSIS — M25552 Pain in left hip: Secondary | ICD-10-CM | POA: Insufficient documentation

## 2023-08-25 DIAGNOSIS — Y92009 Unspecified place in unspecified non-institutional (private) residence as the place of occurrence of the external cause: Secondary | ICD-10-CM | POA: Diagnosis not present

## 2023-08-25 DIAGNOSIS — Z743 Need for continuous supervision: Secondary | ICD-10-CM | POA: Diagnosis not present

## 2023-08-25 DIAGNOSIS — S00212A Abrasion of left eyelid and periocular area, initial encounter: Secondary | ICD-10-CM | POA: Diagnosis not present

## 2023-08-25 DIAGNOSIS — W01198A Fall on same level from slipping, tripping and stumbling with subsequent striking against other object, initial encounter: Secondary | ICD-10-CM | POA: Diagnosis not present

## 2023-08-25 DIAGNOSIS — S80212A Abrasion, left knee, initial encounter: Secondary | ICD-10-CM | POA: Diagnosis not present

## 2023-08-25 DIAGNOSIS — T07XXXA Unspecified multiple injuries, initial encounter: Secondary | ICD-10-CM

## 2023-08-25 DIAGNOSIS — G4489 Other headache syndrome: Secondary | ICD-10-CM | POA: Diagnosis not present

## 2023-08-25 DIAGNOSIS — S0083XA Contusion of other part of head, initial encounter: Secondary | ICD-10-CM | POA: Insufficient documentation

## 2023-08-25 DIAGNOSIS — R6889 Other general symptoms and signs: Secondary | ICD-10-CM | POA: Diagnosis not present

## 2023-08-25 DIAGNOSIS — S0990XA Unspecified injury of head, initial encounter: Secondary | ICD-10-CM | POA: Diagnosis not present

## 2023-08-25 DIAGNOSIS — R9431 Abnormal electrocardiogram [ECG] [EKG]: Secondary | ICD-10-CM | POA: Diagnosis not present

## 2023-08-25 DIAGNOSIS — W19XXXA Unspecified fall, initial encounter: Secondary | ICD-10-CM | POA: Diagnosis not present

## 2023-08-25 MED ORDER — GABAPENTIN 100 MG PO CAPS: 100.0000 mg | ORAL_CAPSULE | Freq: Two times a day (BID) | ORAL | 1 refills | Status: DC

## 2023-08-25 NOTE — ED Provider Notes (Signed)
[place] EMERGENCY DEPARTMENT AT [facility] Provider Note   CSN: [id number] Arrival date & time: 08/25/23  1334     History  Chief Complaint  Patient presents with   Stephanie Barber Stephanie Barber is a 82 y.o. female.  Patient to ED after fall at home. History of dementia and felt unreliable historian. Per EMS, witnessed fall with brief LOC (patient denies). She states she was helping her husband unload a cabinet from a truck and lost her balance, falling onto her left side. She reports headache and left hip pain. No nausea, vomiting. Patient not anticoagulated.   The history is provided by the patient and the EMS personnel. No language interpreter was used.  Fall       Home Medications Prior to Admission medications   Medication Sig Start Date End Date Taking? Authorizing Provider  acetaminophen (TYLENOL) 500 MG tablet Take 1,000 mg by mouth every 6 (six) hours as needed for moderate pain.    [provider]  acetaminophen (TYLENOL) 650 MG CR tablet Take 1,300 mg by mouth every 8 (eight) hours as needed for pain.    [provider]  amLODipine (NORVASC) 10 MG tablet Take 10 mg by mouth in the morning.    [provider]  Apoaequorin (PREVAGEN PO) Take 1 capsule by mouth daily.    [provider]  carboxymethylcellulose (REFRESH PLUS) 0.5 % SOLN Place 1 drop into both eyes daily as needed (dry/irritated eyes).    [provider]  cyanocobalamin (VITAMIN B12) 1000 MCG tablet Take 1,000 mcg by mouth daily.    [provider]  escitalopram (LEXAPRO) 10 MG tablet 1 tablet Orally twice a day for 30 days 05/02/23   [provider]  gabapentin (NEURONTIN) 100 MG capsule Take 1 capsule (100 mg total) by mouth 2 (two) times daily. 08/25/23   Cammy Copa, MD  [employer] [facility] RELIEF EX) Apply 1 Application topically daily as needed (pain).    [provider]  lipase/protease/amylase  (CREON) 36000 UNITS CPEP capsule Take 36,000-72,000 Units by mouth See admin instructions. Take 41324 units with meals and 36000 units with snacks    [provider]  Menthol, Topical Analgesic, (BIOFREEZE EX) Apply 1 Application topically daily as needed (pain).    [provider]  metoprolol succinate (TOPROL-XL) 50 MG 24 hr tablet Take 50 mg by mouth in the morning.    [provider]  pantoprazole (PROTONIX) 40 MG tablet Take 1 tablet (40 mg total) by mouth daily. Patient not taking: Reported on 12/20/2022 07/29/22 07/29/23  Dorcas Carrow, MD  TURMERIC PO Take 1 capsule by mouth daily.    [provider]      Allergies    Morphine and codeine and Lactose intolerance (gi)    Review of Systems   Review of Systems  Physical Exam Updated Vital Signs BP (!) 136/57   Pulse 66   Temp 98.5 F (36.9 C) (Oral)   Resp 18   Ht 4\' 11"  (1.499 m)   Wt 51 kg   SpO2 100%   BMI 22.71 kg/m  Physical Exam Vitals and nursing note reviewed.  Constitutional:      Appearance: She is not ill-appearing.  HENT:     Head: Normocephalic.     Comments: Abrasion to small hematoma over left eyebrow. No other facial bone tenderness. No scalp tenderness.  Eyes:     Extraocular Movements: Extraocular movements intact.     Pupils:  Pupils are equal, round, and reactive to light.  Neck:     Comments: Collar in place.  Cardiovascular:     Rate and Rhythm: Normal rate and regular rhythm.  Pulmonary:     Effort: Pulmonary effort is normal.     Breath sounds: No wheezing, rhonchi or rales.  Abdominal:     General: Abdomen is flat.     Palpations: Abdomen is soft.  Musculoskeletal:     Comments: FROM without limitation of bilateral upper extremities and right lower extremity. Tender over left hip and proximal femur. No shortening or rotation. No bony deformity of the left knee or lower leg. Nontender knee and lower leg.   Skin:    General: Skin is warm and dry.      Comments: Superficial abrasion left eyebrow. Multiple abrasions anterior left knee.   Neurological:     Mental Status: She is alert.     Comments: Awake, alert. Follows commands.      ED Results / Procedures / Treatments   Labs (all labs ordered are listed, but only abnormal results are displayed) Labs Reviewed - No data to display  EKG EKG Interpretation Date/Time:  Saturday August 25 2023 13:52:34 EDT Ventricular Rate:  63 PR Interval:  159 QRS Duration:  76 QT Interval:  382 QTC Calculation: 391 R Axis:   41  Text Interpretation: Sinus rhythm Low voltage, precordial leads Confirmed by Margarita Grizzle [phone]) on 08/25/2023 2:05:35 PM  Radiology DG Hip Unilat W or Wo Pelvis 2-3 Views Left  Result Date: 08/25/2023 CLINICAL DATA:  Fall.  Left lateral hip pain EXAM: DG HIP (WITH PELVIS) 2V LEFT COMPARISON:  CTA AP 07/25/22 FINDINGS: Assessment of the sacrum is slightly limited due to overlying bowel gas. There is no evidence of hip fracture or dislocation. There is no evidence of arthropathy or other focal bone abnormality. Redemonstrated nonspecific sclerotic lesion at the pubic symphysis on the left. IMPRESSION: No acute osseous abnormality Electronically Signed   By: Lorenza Cambridge M.D.   On: 08/25/2023 15:28    Procedures Procedures    Medications Ordered in ED Medications - No data to display  ED Course/ Medical Decision Making/ A&P Clinical Course as of 08/25/23 1538  Sat Aug 25, 2023  1455 Patient to ED after mechanical fall at home. Hematoma to forehead, in c-collar on arrival, c/o left hip pain. No family at bedside but, per EMS, husband reports she is at baseline mental status.  [SU]  1536 Patient in CT/xray. Husband now at bedside and corroborates history provided by patient. Does not feel she passed out during fall.   Plan: if CT head/neck and left hip imaging are negative, patient will need to ambulate but anticipate discharge home. Patient care signed out to  oncoming provider team pending imaging.  [SU]    Clinical Course User Index [SU] Elpidio Anis, PA-C                                 Medical Decision Making Amount and/or Complexity of Data Reviewed Radiology: ordered.           Final Clinical Impression(s) / ED Diagnoses Final diagnoses:  Contusion of face, initial encounter  Multiple abrasions  Pain of left hip    Rx / DC Orders ED Discharge Orders     None         Elpidio Anis, Cordelia Poche 08/25/23 1538    Ray, Duwayne Heck,  MD 08/26/23 1422

## 2023-08-25 NOTE — Progress Notes (Signed)
Office Visit Note   Patient: Stephanie Barber           Date of Birth: 1941/06/04           MRN: [id number] Visit Date: 08/24/2023 Requested by: No referring provider defined for this encounter. PCP: Elias Else, MD (Inactive)  Subjective: Chief Complaint  Patient presents with   Other     Review MRI    HPI: Stephanie Barber is a 82 y.o. female who presents to the office reporting left shoulder and arm pain.  Since she was last seen she has had an MRI scan.  Patient had left shoulder rotator cuff repair in 2016.  She did have an injection which helped her for about 1 to 2 days getting her 50% better.  That was done about 6 weeks ago.  Pain radiates into her neck as well.  Does also have a history of epidural steroid injections into her cervical spine.  Cannot take anti-inflammatories because of bleeding issues.  MRI scan shows postsurgical changes in the rotator cuff with possible full-thickness rotator cuff tear read tear of about 10 mm.  Exam in the scan with the patient and overall it is not a massive retracted tear but there may be some postsurgical changes with a small full-thickness component..                ROS: All systems reviewed are negative as they relate to the chief complaint within the history of present illness.  Patient denies fevers or chills.  Assessment & Plan: Visit Diagnoses:  1. Cervical radiculopathy     Plan: Impression is left shoulder pain with history of neck radiculopathy and cervical spine radiculopathy.  Plan is we will try tramadol for pain relief in 8-week return with decision for or against further intervention in the shoulder and/or neck at that time.  Shoulder intervention would be reverse shoulder replacement which I do not know if it is hurting her enough at this time to undergo such an extensive procedure.  I do not think she really wants to consider cervical spine surgery either.  We will try Neurontin 100 mg twice a day to see if that can  help with some of her pain symptoms. Follow-Up Instructions: No follow-ups on file.   Orders:  No orders of the defined types were placed in this encounter.  No orders of the defined types were placed in this encounter.     Procedures: No procedures performed   Clinical Data: No additional findings.  Objective: Vital Signs: There were no vitals taken for this visit.  Physical Exam:  Constitutional: Patient appears well-developed HEENT:  Head: Normocephalic Eyes:EOM are normal Neck: Normal range of motion Cardiovascular: Normal rate Pulmonary/chest: Effort normal Neurologic: Patient is alert Skin: Skin is warm Psychiatric: Patient has normal mood and affect  Ortho Exam: Ortho exam demonstrates mild pain with cervical spine range of motion.  Slight crepitus with internal/external rotation on that left-hand side but no restriction of passive range of motion compared to the right.  Motor or sensory function of the hand is intact.  She can achieve forward flexion and abduction both above 90 degrees.  Radial pulse intact bilaterally.  No masses lymphadenopathy or skin changes noted in that shoulder girdle region.  Specialty Comments:  MRI CERVICAL SPINE WITHOUT CONTRAST   TECHNIQUE: Multiplanar, multisequence MR imaging of the cervical spine was performed. No intravenous contrast was administered.   COMPARISON:  11/15/2016   FINDINGS: Alignment: Physiologic.  Vertebrae: No fracture, evidence of discitis, or bone lesion.   Cord: Normal signal and morphology.   Posterior Fossa, vertebral arteries, paraspinal tissues: Negative.   Disc levels:   C1-2: Unremarkable.   C2-3: Small central disc protrusion. There is no spinal canal stenosis. No neural foraminal stenosis.   C3-4: Small disc bulge with uncovertebral hypertrophy. Unchanged mild spinal canal stenosis. Mild bilateral neural foraminal stenosis.   C4-5: Small disc bulge with uncovertebral hypertrophy,  slightly worsened. Worsened mild spinal canal stenosis. Mild bilateral neural foraminal stenosis.   C5-6: Small central disc protrusion with bilateral uncovertebral hypertrophy. Mild spinal canal stenosis. Unchanged moderate left neural foraminal stenosis.   C6-7: Unchanged small disc bulge. There is no spinal canal stenosis. No neural foraminal stenosis.   C7-T1: Unchanged left facet hypertrophy. There is no spinal canal stenosis. Unchanged moderate left neural foraminal stenosis.   IMPRESSION: 1. Slight worsening of mild spinal canal stenosis at C4-5. 2. Unchanged mild spinal canal stenosis at C3-4 and C5-6. 3. Unchanged moderate left C5-6 and C7-T1 neural foraminal stenosis. 4. Otherwise mild multilevel foraminal stenosis, unchanged.     Electronically Signed   By: Deatra Robinson M.D.   On: 11/13/2022 03:58  Imaging: No results found.   PMFS History: Patient Active Problem List   Diagnosis Date Noted   Impingement syndrome of left shoulder 10/31/2022   Other spondylosis with radiculopathy, cervical region 10/12/2022   GIB (gastrointestinal bleeding) 09/20/2022   Pancreatic insufficiency 09/20/2022   GI bleeding 07/23/2022   Stage 3b chronic kidney disease (CKD) (HCC)    Other spondylosis with radiculopathy, lumbar region 09/19/2021   IDA (iron deficiency anemia) 07/06/2021   Protein-calorie malnutrition, severe 06/21/2021   ABLA (acute blood loss anemia)    Malnutrition of moderate degree 06/16/2021   Hematochezia 06/15/2021   Acute GI bleeding 06/15/2021   Hyperlipidemia associated with type 2 diabetes mellitus (HCC) 06/15/2021   Left-sided headache 06/30/2019   Memory loss 06/30/2019   AKI (acute kidney injury) (HCC) 05/06/2017   Dehydration 05/06/2017   Chest pain 05/06/2017   Wound infection after surgery 01/30/2017   Palpitations 08/24/2016   Noncompliance with CPAP treatment 12/28/2015   Poor compliance with CPAP treatment 10/26/2014   CPAP use  counseling 07/28/2014   OSA on CPAP 07/28/2014   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   Hypersomnolent 04/02/2014   Obstructive sleep apnea 04/02/2014   Non-compliance with treatment 03/05/2014   Type 2 diabetes mellitus (HCC) 12/05/2011   Hypertension 12/05/2011   GERD (gastroesophageal reflux disease)    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   Chronic cough 07/26/2011   Past Medical History:  Diagnosis Date   Arthritis    Chronic headache    Complication of anesthesia    allergy to Novocaine    Diabetes mellitus    GERD (gastroesophageal reflux disease)    GI bleed    Heart murmur    High cholesterol    Hyperlipidemia    Hypertension    Intracranial atherosclerosis    per MRA   Migraine    Narcolepsy and cataplexy    OSA    states has not used CPAP in over a year   Peripheral vascular disease (HCC)    Rectal prolapse    Stage 3b chronic kidney disease (CKD) (HCC)    Trigger finger of all digits of right hand     Family History  Problem Relation Age of Onset   Heart disease Mother    Throat cancer Father    Cancer  Father        stomach   Kidney cancer Brother    Heart disease Brother     Past Surgical History:  Procedure Laterality Date   ABDOMINAL HYSTERECTOMY     Fibroids   BACK SURGERY     L4,L5 discectomy   BOTOX INJECTION N/A 01/02/2023   Procedure: BOTOX INJECTION;  Surgeon: Noel Christmas, MD;  Location: [facility];  Service: Urology;  Laterality: N/A;   CARDIAC CATHETERIZATION     CARPAL TUNNEL RELEASE Right 01/18/2017   COLONOSCOPY N/A 07/28/2022   Procedure: COLONOSCOPY;  Surgeon: Vida Rigger, MD;  Location: [facility];  Service: Gastroenterology;  Laterality: N/A;   COLONOSCOPY WITH PROPOFOL N/A 06/17/2021   Procedure: COLONOSCOPY WITH PROPOFOL;  Surgeon: Charlott Rakes, MD;  Location: [facility];  Service: Endoscopy;  Laterality: N/A;   CYSTOSCOPY WITH INJECTION N/A 01/02/2023   Procedure: CYSTOSCOPY WITH Mariam Dollar;  Surgeon: Noel Christmas, MD;  Location: [facility];  Service: Urology;  Laterality: N/A;  45 MINS   ESOPHAGOGASTRODUODENOSCOPY (EGD) WITH PROPOFOL N/A 06/17/2021   Procedure: ESOPHAGOGASTRODUODENOSCOPY (EGD) WITH PROPOFOL;  Surgeon: Charlott Rakes, MD;  Location: [facility];  Service: Endoscopy;  Laterality: N/A;   KIDNEY SURGERY  1998   Right.  growth removed   POLYPECTOMY  06/17/2021   Procedure: POLYPECTOMY;  Surgeon: Charlott Rakes, MD;  Location: [facility];  Service: Endoscopy;;   POLYPECTOMY  07/28/2022   Procedure: POLYPECTOMY;  Surgeon: Vida Rigger, MD;  Location: [facility];  Service: Gastroenterology;;   rectal prolapse repair  12/05/2011   ROTATOR CUFF REPAIR     Left   SHOULDER ARTHROSCOPY WITH SUBACROMIAL DECOMPRESSION, ROTATOR CUFF REPAIR AND BICEP TENDON REPAIR Left 05/25/2015   Procedure: SHOULDER ARTHROSCOPY WITH SUBACROMIAL DECOMPRESSION, ROTATOR CUFF REPAIR AND BICEP TENODESIS, DEBRIDEMENT. ;  Surgeon: Cammy Copa, MD;  Location: [facility];  Service: Orthopedics;  Laterality: Left;  LEFT SHOULDER ROTATOR CUFF TEAR REPAIR, ARTHROSCOPY, DEBRIDEMENT, BICEPS TENODESIS, SUBACROMIAL DECOMPRESSION.   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  Tobacco Use   Smoking status: Former    Current packs/day: 0.00    Types: Cigarettes    Quit date: 11/13/1980    Years since quitting: 42.8   Smokeless tobacco: Never   Tobacco comments:    quit in 1982  Vaping Use   Vaping status: Never Used  Substance and Sexual Activity   Alcohol use: No    Alcohol/week: 0.0 standard drinks of alcohol   Drug use: No   Sexual activity: Yes

## 2023-08-25 NOTE — ED Triage Notes (Addendum)
Pt arrived via [facility] from home for a witnessed fall. Per EMS, fire told them pt did have LOC. Pt has hx dementia. Per EMS, pt's husband told EMS, pt neuro at baseline. Pt is A&Ox4. Pt arrived w/c-collar on. Pt c/o head pain and left leg pain. Pt fell and hit her head on the concrete. Pt has a hematoma on left side of forehead approx the size of a half dollar with abrasion on top. Pt c/o left leg pain. TRN looked through pt chart and pt no longer takes blood thinners. Pt has three abrasions on left outer knee and an abrasion on right knee. Bleeding is controlled

## 2023-08-25 NOTE — ED Provider Notes (Signed)
Accepted handoff at shift change from [facility]. Please see prior provider note for full HPI.  Briefly: Patient is a 82 y.o. female who presents to the ER for fall. Hx of dementia. Witnessed fall with brief LOC, left onto her left side. Not on blood thinners.   DDX/Plan: Pending imaging of CT head, CT cervical spine, and left hip XR. If normal, plan to ambulate and anticipate dc to home. Patient and husband made aware of plan at time of shift change and are agreeable.  Physical Exam  BP (!) 146/60   Pulse 70   Temp 98.5 F (36.9 C) (Oral)   Resp 19   Ht 4\' 11"  (1.499 m)   Wt 51 kg   SpO2 100%   BMI 22.71 kg/m   Physical Exam Vitals and nursing note reviewed.  Constitutional:      Appearance: Normal appearance.  HENT:     Head: Normocephalic.     Comments: Abrasion and contusion to left forehead Eyes:     Conjunctiva/sclera: Conjunctivae normal.  Pulmonary:     Effort: Pulmonary effort is normal. No respiratory distress.  Skin:    General: Skin is warm and dry.     Comments: Bandages to bilateral knees  Neurological:     Mental Status: She is alert.  Psychiatric:        Mood and Affect: Mood normal.        Behavior: Behavior normal.     Results  CT Head Wo Contrast  Result Date: 08/25/2023 CLINICAL DATA:  Head trauma EXAM: CT HEAD WITHOUT CONTRAST CT CERVICAL SPINE WITHOUT CONTRAST TECHNIQUE: Multidetector CT imaging of the head and cervical spine was performed following the standard protocol without intravenous contrast. Multiplanar CT image reconstructions of the cervical spine were also generated. RADIATION DOSE REDUCTION: This exam was performed according to the departmental dose-optimization program which includes automated exposure control, adjustment of the mA and/or kV according to patient size and/or use of iterative reconstruction technique. COMPARISON:  None Available. FINDINGS: CT HEAD FINDINGS Brain: No evidence of acute infarction, hemorrhage,  hydrocephalus, extra-axial collection or mass lesion/mass effect. Vascular: No hyperdense vessel or unexpected calcification. Skull: Normal. Negative for fracture or focal lesion. Sinuses/Orbits: No acute finding. Other: None. CT CERVICAL SPINE FINDINGS Alignment: Normal. Skull base and vertebrae: No acute fracture. No primary bone lesion or focal pathologic process. Soft tissues and spinal canal: No prevertebral fluid or swelling. No visible canal hematoma. Disc levels: Moderate multilevel disc space height loss and osteophytosis throughout the cervical spine. Upper chest: Negative. Other: None. IMPRESSION: 1. No acute intracranial pathology. 2. No fracture or static subluxation of the cervical spine. 3. Moderate multilevel cervical disc degenerative disease. Electronically Signed   By: Jearld Lesch M.D.   On: 08/25/2023 16:24   CT Cervical Spine Wo Contrast  Result Date: 08/25/2023 CLINICAL DATA:  Head trauma EXAM: CT HEAD WITHOUT CONTRAST CT CERVICAL SPINE WITHOUT CONTRAST TECHNIQUE: Multidetector CT imaging of the head and cervical spine was performed following the standard protocol without intravenous contrast. Multiplanar CT image reconstructions of the cervical spine were also generated. RADIATION DOSE REDUCTION: This exam was performed according to the departmental dose-optimization program which includes automated exposure control, adjustment of the mA and/or kV according to patient size and/or use of iterative reconstruction technique. COMPARISON:  None Available. FINDINGS: CT HEAD FINDINGS Brain: No evidence of acute infarction, hemorrhage, hydrocephalus, extra-axial collection or mass lesion/mass effect. Vascular: No hyperdense vessel or unexpected calcification. Skull: Normal. Negative for fracture or  focal lesion. Sinuses/Orbits: No acute finding. Other: None. CT CERVICAL SPINE FINDINGS Alignment: Normal. Skull base and vertebrae: No acute fracture. No primary bone lesion or focal pathologic  process. Soft tissues and spinal canal: No prevertebral fluid or swelling. No visible canal hematoma. Disc levels: Moderate multilevel disc space height loss and osteophytosis throughout the cervical spine. Upper chest: Negative. Other: None. IMPRESSION: 1. No acute intracranial pathology. 2. No fracture or static subluxation of the cervical spine. 3. Moderate multilevel cervical disc degenerative disease. Electronically Signed   By: Jearld Lesch M.D.   On: 08/25/2023 16:24   DG Hip Unilat W or Wo Pelvis 2-3 Views Left  Result Date: 08/25/2023 CLINICAL DATA:  Fall.  Left lateral hip pain EXAM: DG HIP (WITH PELVIS) 2V LEFT COMPARISON:  CTA AP 07/25/22 FINDINGS: Assessment of the sacrum is slightly limited due to overlying bowel gas. There is no evidence of hip fracture or dislocation. There is no evidence of arthropathy or other focal bone abnormality. Redemonstrated nonspecific sclerotic lesion at the pubic symphysis on the left. IMPRESSION: No acute osseous abnormality Electronically Signed   By: Lorenza Cambridge M.D.   On: 08/25/2023 15:28    ED Course / MDM   Clinical Course as of 08/25/23 1631  Sat Aug 25, 2023  1455 Patient to ED after mechanical fall at home. Hematoma to forehead, in c-collar on arrival, c/o left hip pain. No family at bedside but, per EMS, husband reports she is at baseline mental status.  [SU]  1536 Patient in CT/xray. Husband now at bedside and corroborates history provided by patient. Does not feel she passed out during fall.   Plan: if CT head/neck and left hip imaging are negative, patient will need to ambulate but anticipate discharge home. Patient care signed out to oncoming provider team pending imaging.  [SU]    Clinical Course User Index [SU] Elpidio Anis, PA-C   Medical Decision Making Amount and/or Complexity of Data Reviewed Radiology: ordered.  Patient ambulated without difficulty.  Imaging all reassuring. Discussed results with patient. Cleaned forehead  abrasion with saline. Left without dressing. Recommended wound care at home and follow up with PCP regarding today's ER visit. Stable for discharge.     Jeanella Flattery 08/25/23 1713    Lonell Grandchild, MD 08/25/23 2035

## 2023-08-25 NOTE — Discharge Instructions (Signed)
Reason the emergency department today after fall.  As we discussed the imaging of your head, neck, and hip all looked reassuring.  I recommend cleaning your wounds and dressed them appropriately.  Please follow-up with your primary doctor regarding your ER visit today.

## 2023-08-26 DIAGNOSIS — M25551 Pain in right hip: Secondary | ICD-10-CM | POA: Diagnosis not present

## 2023-08-26 DIAGNOSIS — W19XXXA Unspecified fall, initial encounter: Secondary | ICD-10-CM | POA: Diagnosis not present

## 2023-09-04 DIAGNOSIS — R350 Frequency of micturition: Secondary | ICD-10-CM | POA: Diagnosis not present

## 2023-09-04 DIAGNOSIS — R5383 Other fatigue: Secondary | ICD-10-CM | POA: Diagnosis not present

## 2023-09-04 DIAGNOSIS — R197 Diarrhea, unspecified: Secondary | ICD-10-CM | POA: Diagnosis not present

## 2023-09-04 DIAGNOSIS — F5101 Primary insomnia: Secondary | ICD-10-CM | POA: Diagnosis not present

## 2023-09-04 DIAGNOSIS — Z9181 History of falling: Secondary | ICD-10-CM | POA: Diagnosis not present

## 2023-09-06 DIAGNOSIS — R197 Diarrhea, unspecified: Secondary | ICD-10-CM | POA: Diagnosis not present

## 2023-09-06 DIAGNOSIS — M25562 Pain in left knee: Secondary | ICD-10-CM | POA: Diagnosis not present

## 2023-09-06 DIAGNOSIS — M25552 Pain in left hip: Secondary | ICD-10-CM | POA: Diagnosis not present

## 2023-09-07 DIAGNOSIS — Z9181 History of falling: Secondary | ICD-10-CM | POA: Diagnosis not present

## 2023-09-07 DIAGNOSIS — E611 Iron deficiency: Secondary | ICD-10-CM | POA: Diagnosis not present

## 2023-09-07 DIAGNOSIS — R5383 Other fatigue: Secondary | ICD-10-CM | POA: Diagnosis not present

## 2023-09-07 DIAGNOSIS — K529 Noninfective gastroenteritis and colitis, unspecified: Secondary | ICD-10-CM | POA: Diagnosis not present

## 2023-09-12 DIAGNOSIS — G5612 Other lesions of median nerve, left upper limb: Secondary | ICD-10-CM | POA: Diagnosis not present

## 2023-09-13 DIAGNOSIS — M75122 Complete rotator cuff tear or rupture of left shoulder, not specified as traumatic: Secondary | ICD-10-CM | POA: Diagnosis not present

## 2023-09-13 DIAGNOSIS — G5602 Carpal tunnel syndrome, left upper limb: Secondary | ICD-10-CM | POA: Diagnosis not present

## 2023-09-13 DIAGNOSIS — M1812 Unilateral primary osteoarthritis of first carpometacarpal joint, left hand: Secondary | ICD-10-CM | POA: Diagnosis not present

## 2023-09-20 ENCOUNTER — Ambulatory Visit (INDEPENDENT_AMBULATORY_CARE_PROVIDER_SITE_OTHER): Payer: Self-pay | Admitting: Adult Health

## 2023-09-20 DIAGNOSIS — Z0389 Encounter for observation for other suspected diseases and conditions ruled out: Secondary | ICD-10-CM

## 2023-09-20 NOTE — Progress Notes (Signed)
Patient no show appointment. ? ?

## 2023-09-21 DIAGNOSIS — L52 Erythema nodosum: Secondary | ICD-10-CM | POA: Diagnosis not present

## 2023-09-21 DIAGNOSIS — Z0000 Encounter for general adult medical examination without abnormal findings: Secondary | ICD-10-CM | POA: Diagnosis not present

## 2023-10-10 DIAGNOSIS — Z9181 History of falling: Secondary | ICD-10-CM | POA: Diagnosis not present

## 2023-10-10 DIAGNOSIS — J069 Acute upper respiratory infection, unspecified: Secondary | ICD-10-CM | POA: Diagnosis not present

## 2023-10-10 DIAGNOSIS — Z03818 Encounter for observation for suspected exposure to other biological agents ruled out: Secondary | ICD-10-CM | POA: Diagnosis not present

## 2023-10-15 ENCOUNTER — Emergency Department (HOSPITAL_COMMUNITY)
Admission: EM | Admit: 2023-10-15 | Discharge: 2023-10-15 | Disposition: A | Discharge status: Home / Self Care | Payer: Medicare Other | Attending: Emergency Medicine | Admitting: Emergency Medicine

## 2023-10-15 ENCOUNTER — Emergency Department (HOSPITAL_COMMUNITY): Payer: Medicare Other

## 2023-10-15 ENCOUNTER — Other Ambulatory Visit: Payer: Self-pay

## 2023-10-15 ENCOUNTER — Encounter (HOSPITAL_COMMUNITY): Payer: Self-pay

## 2023-10-15 ENCOUNTER — Encounter: Payer: Self-pay | Admitting: Physician Assistant

## 2023-10-15 DIAGNOSIS — R197 Diarrhea, unspecified: Secondary | ICD-10-CM | POA: Diagnosis not present

## 2023-10-15 DIAGNOSIS — E119 Type 2 diabetes mellitus without complications: Secondary | ICD-10-CM | POA: Diagnosis not present

## 2023-10-15 DIAGNOSIS — Z79899 Other long term (current) drug therapy: Secondary | ICD-10-CM | POA: Diagnosis not present

## 2023-10-15 DIAGNOSIS — R0789 Other chest pain: Secondary | ICD-10-CM | POA: Diagnosis not present

## 2023-10-15 DIAGNOSIS — R109 Unspecified abdominal pain: Secondary | ICD-10-CM | POA: Insufficient documentation

## 2023-10-15 DIAGNOSIS — R5383 Other fatigue: Secondary | ICD-10-CM | POA: Diagnosis not present

## 2023-10-15 DIAGNOSIS — I10 Essential (primary) hypertension: Secondary | ICD-10-CM | POA: Insufficient documentation

## 2023-10-15 DIAGNOSIS — R079 Chest pain, unspecified: Secondary | ICD-10-CM | POA: Diagnosis not present

## 2023-10-15 DIAGNOSIS — K529 Noninfective gastroenteritis and colitis, unspecified: Secondary | ICD-10-CM | POA: Diagnosis not present

## 2023-10-15 LAB — BASIC METABOLIC PANEL
Anion gap: 8 (ref 5–15)
BUN: 20 mg/dL (ref 8–23)
CO2: 21 mmol/L — ABNORMAL LOW (ref 22–32)
Calcium: 9.3 mg/dL (ref 8.9–10.3)
Chloride: 110 mmol/L (ref 98–111)
Creatinine, Ser: 1.54 mg/dL — ABNORMAL HIGH (ref 0.44–1.00)
GFR, Estimated: 33 mL/min — ABNORMAL LOW (ref >60)
Glucose, Bld: 85 mg/dL (ref 70–99)
Potassium: 4.1 mmol/L (ref 3.5–5.1)
Sodium: 139 mmol/L (ref 135–145)

## 2023-10-15 LAB — CBC
HCT: 34.2 % — ABNORMAL LOW (ref 36.0–46.0)
Hemoglobin: 11.1 g/dL — ABNORMAL LOW (ref 12.0–15.0)
MCH: 30.9 pg (ref 26.0–34.0)
MCHC: 32.5 g/dL (ref 30.0–36.0)
MCV: 95.3 fL (ref 80.0–100.0)
Platelets: 213 10*3/uL (ref 150–400)
RBC: 3.59 MIL/uL — ABNORMAL LOW (ref 3.87–5.11)
RDW: 12.6 % (ref 11.5–15.5)
WBC: 4 10*3/uL (ref 4.0–10.5)
nRBC: 0 % (ref 0.0–0.2)

## 2023-10-15 LAB — TROPONIN I (HIGH SENSITIVITY)
Troponin I (High Sensitivity): 4 ng/L (ref <18)
Troponin I (High Sensitivity): 3 ng/L (ref <18)

## 2023-10-15 MED ORDER — FIDAXOMICIN 200 MG PO TABS
200.0000 mg | ORAL_TABLET | Freq: Two times a day (BID) | ORAL | Status: DC
  Administered 2023-10-15: 200 mg via ORAL
  Filled 2023-10-15: qty 1

## 2023-10-15 MED ORDER — DIFICID 200 MG PO TABS: 200.0000 mg | ORAL_TABLET | Freq: Two times a day (BID) | ORAL | 0 refills | Status: DC

## 2023-10-15 NOTE — ED Provider Notes (Signed)
  Physical Exam  BP (!) 177/76   Pulse (!) 52   Temp 98.2 F (36.8 C)   Resp 18   Ht 4\' 11"  (1.499 m)   Wt 51 kg   SpO2 97%   BMI 22.71 kg/m   Physical Exam  Procedures  Procedures  ED Course / MDM    Medical Decision Making Amount and/or Complexity of Data Reviewed Labs: ordered. Radiology: ordered.  Risk Prescription drug management.  This patient's care was assumed by me at shift change. The tests pending are 2nd troponin. Plan is for discharge after results.   Patient was re-evaluated by me as well. I discussed their result with them and plan is for charge home follow-up with cardiology, patient's antibiotics were switched for C. difficile.  She has been chest pain-free in the ED.  Referred to cardiology.         Ma Rings, PA-C 10/15/23 2595    Rozelle Logan, DO 10/17/23 1620

## 2023-10-15 NOTE — Discharge Instructions (Addendum)
You have been eval for your chest pain.  Fortunately no concerning findings were noted on today's exam.  Please call and follow-up closely with cardiology office for outpatient evaluation.  You have also been evaluated for your persistent diarrhea.  It is likely that you have failed initial treatments for C. difficile.  Please take Dificid twice daily for the next 10 days as treatment for your symptoms and follow-up closely with your doctor for recheck.

## 2023-10-15 NOTE — ED Triage Notes (Signed)
Left sided chest pain that started today, pain is intermittent. No pain on arrival. Pt is on vancomycin for infection in stool.

## 2023-10-15 NOTE — ED Provider Triage Note (Signed)
Emergency Medicine Provider Triage Evaluation Note  Stephanie Barber , a 82 y.o. female  was evaluated in triage.  Pt complains of chest pain.  Review of Systems  Positive: Chest pain today (none now) Negative: Vomiting, fever, cough  Physical Exam  Ht 4\' 11"  (1.499 m)   Wt 51 kg   BMI 22.71 kg/m  Gen:   Awake, no distress   Resp:  Normal effort  MSK:   Moves extremities without difficulty  Other:    Medical Decision Making  Medically screening exam initiated at 5:39 PM.  Appropriate orders placed.  KELSEY SOLDAN was informed that the remainder of the evaluation will be completed by another provider, this initial triage assessment does not replace that evaluation, and the importance of remaining in the ED until their evaluation is complete.  Sent by her doctor for evaluation of chest pain that started earlier today. No pain at present.      Elpidio Anis, PA-C 10/15/23 1740

## 2023-10-15 NOTE — ED Provider Notes (Signed)
[place] EMERGENCY DEPARTMENT AT [facility] Provider Note   CSN: [id number] Arrival date & time: 10/15/23  1723     History  Chief Complaint  Patient presents with   Chest Pain    Stephanie Barber is a 82 y.o. female.  The history is provided by the patient, the spouse and medical records. No language interpreter was used.  Chest Pain    82 year old female significant history of diabetes, hypertension, hyperlipidemia, currently being treated for C. difficile with vancomycin, sent here by PCP for concerns of chest pain.  Patient report for the past 2 months she has had recurrent diarrhea.  She recently test positive for C. difficile and did take antibiotic for several weeks.  She has finished antibiotic for more than a week but still having persistent diarrhea and abdominal cramping.  Today while sitting she developed pain to her chest.  Described pain as a pulling sensation to her left chest and felt a bit weak.  Symptom lasting for approximately 45 minutes and has since resolved.  There was no associated lightheadedness or dizziness no shortness of breath no nausea or diaphoresis during this episode.  She denies any symptoms cardiac history and no prior cardiac stress test.  She did reach out to her PCP and was seen today for her complaint and sent here for further evaluation.  Home Medications Prior to Admission medications   Medication Sig Start Date End Date Taking? Authorizing Provider  acetaminophen (TYLENOL) 500 MG tablet Take 1,000 mg by mouth every 6 (six) hours as needed for moderate pain.    [provider]  acetaminophen (TYLENOL) 650 MG CR tablet Take 1,300 mg by mouth every 8 (eight) hours as needed for pain.    [provider]  amLODipine (NORVASC) 10 MG tablet Take 10 mg by mouth in the morning.    [provider]  Apoaequorin (PREVAGEN PO) Take 1 capsule by mouth daily.    [provider]  carboxymethylcellulose  (REFRESH PLUS) 0.5 % SOLN Place 1 drop into both eyes daily as needed (dry/irritated eyes).    [provider]  cyanocobalamin (VITAMIN B12) 1000 MCG tablet Take 1,000 mcg by mouth daily.    [provider]  escitalopram (LEXAPRO) 10 MG tablet 1 tablet Orally twice a day for 30 days 05/02/23   [provider]  gabapentin (NEURONTIN) 100 MG capsule Take 1 capsule (100 mg total) by mouth 2 (two) times daily. 08/25/23   Cammy Copa, MD  Homeopathic Products [facility] RELIEF EX) Apply 1 Application topically daily as needed (pain).    [provider]  lipase/protease/amylase (CREON) 36000 UNITS CPEP capsule Take 36,000-72,000 Units by mouth See admin instructions. Take 40981 units with meals and 36000 units with snacks    [provider]  Menthol, Topical Analgesic, (BIOFREEZE EX) Apply 1 Application topically daily as needed (pain).    [provider]  metoprolol succinate (TOPROL-XL) 50 MG 24 hr tablet Take 50 mg by mouth in the morning.    [provider]  pantoprazole (PROTONIX) 40 MG tablet Take 1 tablet (40 mg total) by mouth daily. Patient not taking: Reported on 12/20/2022 07/29/22 07/29/23  Dorcas Carrow, MD  TURMERIC PO Take 1 capsule by mouth daily.    [provider]      Allergies    Morphine and codeine and Lactose intolerance (gi)    Review of Systems   Review of Systems  Cardiovascular:  Positive for chest pain.  All other systems reviewed and are negative.   Physical Exam Updated Vital Signs BP (!) 169/74 (BP Location: Left Arm)   Pulse (!) 51   Temp 98.2 F (36.8 C) (Oral)   Resp 17   Ht 4\' 11"  (1.499 m)   Wt 51 kg   SpO2 100%   BMI 22.71 kg/m  Physical Exam Vitals and nursing note reviewed.  Constitutional:      General: She is not in acute distress.    Appearance: She is well-developed.  HENT:     Head: Atraumatic.  Eyes:     Conjunctiva/sclera: Conjunctivae normal.  Cardiovascular:      Rate and Rhythm: Normal rate and regular rhythm.     Pulses: Normal pulses.     Heart sounds: Normal heart sounds.  Pulmonary:     Effort: Pulmonary effort is normal.  Abdominal:     Palpations: Abdomen is soft.     Tenderness: There is abdominal tenderness (Mild diffuse abdominal tenderness no guarding no rebound tenderness).  Musculoskeletal:     Cervical back: Neck supple.  Skin:    Findings: No rash.  Neurological:     Mental Status: She is alert.  Psychiatric:        Mood and Affect: Mood normal.     ED Results / Procedures / Treatments   Labs (all labs ordered are listed, but only abnormal results are displayed) Labs Reviewed  BASIC METABOLIC PANEL - Abnormal; Notable for the following components:      Result Value   CO2 21 (*)    Creatinine, Ser 1.54 (*)    GFR, Estimated 33 (*)    All other components within normal limits  CBC - Abnormal; Notable for the following components:   RBC 3.59 (*)    Hemoglobin 11.1 (*)    HCT 34.2 (*)    All other components within normal limits  TROPONIN I (HIGH SENSITIVITY)  TROPONIN I (HIGH SENSITIVITY)    EKG EKG Interpretation Date/Time:  Monday October 15 2023 17:45:20 EST Ventricular Rate:  51 PR Interval:  167 QRS Duration:  81 QT Interval:  437 QTC Calculation: 403 R Axis:   55  Text Interpretation: Sinus rhythm Low voltage, precordial leads Probable anteroseptal infarct, old Similar to previous Confirmed by Coralee Pesa [phone]) on 10/15/2023 8:43:23 PM  Radiology DG Chest 2 View  Result Date: 10/15/2023 CLINICAL DATA:  Left-sided chest pain. EXAM: CHEST - 2 VIEW COMPARISON:  September 28, 2022 FINDINGS: The heart size and mediastinal contours are within normal limits. Both lungs are clear. A solitary radiopaque surgical clip is seen within the left upper quadrant. Chronic and degenerative changes are seen involving the bilateral shoulders, left greater than right. Multilevel degenerative changes are present  throughout the thoracic spine. IMPRESSION: No active cardiopulmonary disease. Electronically Signed   By: Aram Candela M.D.   On: 10/15/2023 21:46    Procedures Procedures    Medications Ordered in ED Medications  fidaxomicin (DIFICID) tablet 200 mg (has no administration in time range)    ED Course/ Medical Decision Making/ A&P             HEART Score: 4                    Medical Decision Making Amount and/or Complexity of Data Reviewed Labs: ordered. Radiology: ordered.  Risk Prescription drug management.   BP (!) 169/74 (BP Location: Left Arm)   Pulse (!) 51   Temp 98.2 F (36.8  C) (Oral)   Resp 17   Ht 4\' 11"  (1.499 m)   Wt 51 kg   SpO2 100%   BMI 22.71 kg/m   1:20 PM  82 year old female significant history of diabetes, hypertension, hyperlipidemia, currently being treated for C. difficile with vancomycin, sent here by PCP for concerns of chest pain.  Patient report for the past 2 months she has had recurrent diarrhea.  She recently test positive for C. difficile and did take antibiotic for several weeks.  She has finished antibiotic for more than a week but still having persistent diarrhea and abdominal cramping.  Today while sitting she developed pain to her chest.  Described pain as a pulling sensation to her left chest and felt a bit weak.  Symptom lasting for approximately 45 minutes and has since resolved.  There was no associated lightheadedness or dizziness no shortness of breath no nausea or diaphoresis during this episode.  She denies any symptoms cardiac history and no prior cardiac stress test.  She did reach out to her PCP and was seen today for her complaint and sent here for further evaluation.  On exam patient is resting comfortably appears to be in no acute discomfort.  Heart with normal rate and rhythm, lungs are clear to auscultation bilaterally abdomen is soft with diffuse tenderness but no guarding or rebound tenderness.  Bowel sounds  present.  -Labs ordered, independently viewed and interpreted by me.  Labs remarkable for normal trop.  Cr 1.54.  -The patient was maintained on a cardiac monitor.  I personally viewed and interpreted the cardiac monitored which showed an underlying rhythm of: sinus bradycardia -Imaging independently viewed and interpreted by me and I agree with radiologist's interpretation.  Result remarkable for CXR without concerning feature -This patient presents to the ED for concern of chest pain, this involves an extensive number of treatment options, and is a complaint that carries with it a high risk of complications and morbidity.  The differential diagnosis includes ACS, msk, pna, ptx, pleurisy, gerd, gastritis, shingles -Co morbidities that complicate the patient evaluation includes DM, HTN, GERD, OSA, HLD -Treatment includes cificid -Reevaluation of the patient after these medicines showed that the patient resolved -PCP office notes or outside notes reviewed -Discussion with pharmacy in regard to pt failed treatment of c.diff after taking oral vanc.  Pharmacist recommend starting dificid 200mg  PO once today and prescribe for 10 days course.  Care discussed with Dr. Wilkie Aye -Escalation to admission/observation considered: patients sign out to oncoming provider who will f/u on 2nd trop.  If unchanged, pt can be d/c home with outpt cardiology f/u -Prescription medication considered, patient comfortable with dificid -Social Determinant of Health considered which includes tobacco use         Final Clinical Impression(s) / ED Diagnoses Final diagnoses:  Nonspecific chest pain  Diarrhea of presumed infectious origin    Rx / DC Orders ED Discharge Orders          Ordered    Ambulatory referral to Cardiology       Comments: If you have not heard from the Cardiology office within the next 72 hours please call [phone].   10/15/23 2120    fidaxomicin (DIFICID) 200 MG TABS tablet  2 times daily         10/15/23 2121              Fayrene Helper, PA-C 10/15/23 2153    Horton, Clabe Seal, DO 10/17/23 1619

## 2023-10-17 DIAGNOSIS — I129 Hypertensive chronic kidney disease with stage 1 through stage 4 chronic kidney disease, or unspecified chronic kidney disease: Secondary | ICD-10-CM | POA: Diagnosis not present

## 2023-10-17 DIAGNOSIS — E7800 Pure hypercholesterolemia, unspecified: Secondary | ICD-10-CM | POA: Diagnosis not present

## 2023-10-17 DIAGNOSIS — N1832 Chronic kidney disease, stage 3b: Secondary | ICD-10-CM | POA: Diagnosis not present

## 2023-10-17 DIAGNOSIS — E1149 Type 2 diabetes mellitus with other diabetic neurological complication: Secondary | ICD-10-CM | POA: Diagnosis not present

## 2023-10-17 DIAGNOSIS — Z23 Encounter for immunization: Secondary | ICD-10-CM | POA: Diagnosis not present

## 2023-10-17 DIAGNOSIS — E1122 Type 2 diabetes mellitus with diabetic chronic kidney disease: Secondary | ICD-10-CM | POA: Diagnosis not present

## 2023-10-17 DIAGNOSIS — R197 Diarrhea, unspecified: Secondary | ICD-10-CM | POA: Diagnosis not present

## 2023-10-17 LAB — LAB REPORT - SCANNED: Creatinine, POC: 128 mg/dL

## 2023-10-19 DIAGNOSIS — Z713 Dietary counseling and surveillance: Secondary | ICD-10-CM | POA: Diagnosis not present

## 2023-10-22 ENCOUNTER — Encounter: Payer: Self-pay | Admitting: Physician Assistant

## 2023-10-22 ENCOUNTER — Ambulatory Visit (INDEPENDENT_AMBULATORY_CARE_PROVIDER_SITE_OTHER): Payer: Medicare Other | Admitting: Adult Health

## 2023-10-22 ENCOUNTER — Encounter: Payer: Self-pay | Admitting: Adult Health

## 2023-10-22 VITALS — BP 145/81 | HR 64 | Ht 59.0 in | Wt 113.0 lb

## 2023-10-22 DIAGNOSIS — G4709 Other insomnia: Secondary | ICD-10-CM

## 2023-10-22 DIAGNOSIS — F331 Major depressive disorder, recurrent, moderate: Secondary | ICD-10-CM | POA: Diagnosis not present

## 2023-10-22 MED ORDER — MIRTAZAPINE 15 MG PO TABS: 15.0000 mg | ORAL_TABLET | Freq: Every day | ORAL | 2 refills | Status: DC

## 2023-10-22 NOTE — Progress Notes (Signed)
[facility] MD/PA/NP Initial Note  10/22/2023 2:28 PM Stephanie Barber  MRN:  [id number]  Patient seen today for initial psychiatric evaluation.   Referred by PCP.  HPI:  Patient seen today for initial psychiatric evaluation.   Accompanied by husband.  Describes mood today as "not the best". Pleasant. Denies tearfulness. Mood symptoms - reports some depression (5 out of 10) - "I want to be out and on the go". Feels like she can't go anywhere or do anything because of the diarrhea. Reports anything she eats - "runs through her". Denies anxiety - "I don't get real nervous". Reports some irritability - "lack of sleep". Denies panic attacks. Reports worry, rumination and over thinking. Varying interest and motivation.  Mood is lower. Stating "my main thing is my sleep". Reports difficulties with sleep for several months. Reports some memory" issues. Reports she stopped driving because she can't remember where she is going. Having issues remembering anything - "a good 6 months ago now". Stating "I don't feel like myself". I don't feel normal". Reports multiple medical issues over the past several months. Reports she struggles with appetite, sleep, and mood symptoms. Willing to consider other options to help stabilize mood.  Energy levels vary. Active, does not have a regular exercise routine.  Enjoys some usual interests and activities. Married. Lives with husband of 62 years. Has 4 children. Spending time with family. Appetite decreased - no taste - no hunger.  Weight stable. Reports difficulties with sleep. Averages 2 hours, then back up again. Reports issues with diarrhea - diagnosed with C-diff in 2020. Diagnosed with sleep apnea several years ago - had a CPAP machine previously. Focus and concentration stable. Completing tasks. Managing aspects of household.  Raised 4 children - multiple jobs and talents. Denies SI or HI.  Denies AH or VH. Denies self harm.  Denies AH or VH.    Previous  medication trials:  Lexapro  Visit Diagnosis: No diagnosis found.  Past Psychiatric History: Denies psychiatric hospitalization.   Past Medical History:  Past Medical History:  Diagnosis Date   Arthritis    Chronic headache    Complication of anesthesia    allergy to Novocaine    Diabetes mellitus    GERD (gastroesophageal reflux disease)    GI bleed    Heart murmur    High cholesterol    Hyperlipidemia    Hypertension    Intracranial atherosclerosis    per MRA   Migraine    Narcolepsy and cataplexy    OSA    states has not used CPAP in over a year   Peripheral vascular disease (HCC)    Rectal prolapse    Stage 3b chronic kidney disease (CKD) (HCC)    Trigger finger of all digits of right hand     Past Surgical History:  Procedure Laterality Date   ABDOMINAL HYSTERECTOMY     Fibroids   BACK SURGERY     L4,L5 discectomy   BOTOX INJECTION N/A 01/02/2023   Procedure: BOTOX INJECTION;  Surgeon: Noel Christmas, MD;  Location: [facility];  Service: Urology;  Laterality: N/A;   CARDIAC CATHETERIZATION     CARPAL TUNNEL RELEASE Right 01/18/2017   COLONOSCOPY N/A 07/28/2022   Procedure: COLONOSCOPY;  Surgeon: Vida Rigger, MD;  Location: [facility];  Service: Gastroenterology;  Laterality: N/A;   COLONOSCOPY WITH PROPOFOL N/A 06/17/2021   Procedure: COLONOSCOPY WITH PROPOFOL;  Surgeon: Charlott Rakes, MD;  Location: [facility];  Service: Endoscopy;  Laterality: N/A;   CYSTOSCOPY  WITH INJECTION N/A 01/02/2023   Procedure: CYSTOSCOPY WITH Mariam Dollar;  Surgeon: Noel Christmas, MD;  Location: [facility];  Service: Urology;  Laterality: N/A;  45 MINS   ESOPHAGOGASTRODUODENOSCOPY (EGD) WITH PROPOFOL N/A 06/17/2021   Procedure: ESOPHAGOGASTRODUODENOSCOPY (EGD) WITH PROPOFOL;  Surgeon: Charlott Rakes, MD;  Location: [facility];  Service: Endoscopy;  Laterality: N/A;   KIDNEY SURGERY  1998   Right.  growth removed   POLYPECTOMY  06/17/2021   Procedure: POLYPECTOMY;  Surgeon:  Charlott Rakes, MD;  Location: [facility];  Service: Endoscopy;;   POLYPECTOMY  07/28/2022   Procedure: POLYPECTOMY;  Surgeon: Vida Rigger, MD;  Location: [facility];  Service: Gastroenterology;;   rectal prolapse repair  12/05/2011   ROTATOR CUFF REPAIR     Left   SHOULDER ARTHROSCOPY WITH SUBACROMIAL DECOMPRESSION, ROTATOR CUFF REPAIR AND BICEP TENDON REPAIR Left 05/25/2015   Procedure: SHOULDER ARTHROSCOPY WITH SUBACROMIAL DECOMPRESSION, ROTATOR CUFF REPAIR AND BICEP TENODESIS, DEBRIDEMENT. ;  Surgeon: Cammy Copa, MD;  Location: [facility];  Service: Orthopedics;  Laterality: Left;  LEFT SHOULDER ROTATOR CUFF TEAR REPAIR, ARTHROSCOPY, DEBRIDEMENT, BICEPS TENODESIS, SUBACROMIAL DECOMPRESSION.   TOTAL ABDOMINAL HYSTERECTOMY     TRIGGER FINGER RELEASE Right 04/30/2018   Procedure: RELEASE TRIGGER FINGER/A-1 PULLEY RIGHT MIDDLE  AND SMALLn ;  Surgeon: Cindee Salt, MD;  Location: [facility];  Service: Orthopedics;  Laterality: Right;  FAB, Bier block   TUBAL LIGATION      Family Psychiatric History: Denies any family history of mental illness.   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  Tobacco Use   Smoking status: Former    Current packs/day: 0.00    Types: Cigarettes    Quit date: 11/13/1980    Years since quitting: 42.9   Smokeless tobacco: Never   Tobacco comments:    quit in 1982  Vaping Use   Vaping status: Never Used  Substance and Sexual Activity   Alcohol use: No    Alcohol/week: 0.0 standard drinks of alcohol   Drug  use: No   Sexual activity: Yes  Other Topics Concern   Not on file  Social History Narrative   21 st January 2014 , patient underwent PS and MSLT - MSLT had one  SREMs and an average time to fall asleep of  *.8 minutes , her Ewort is 20 points,: facit: this patient has severe hypersomnia and is at risk when driving. medication in form ogf nuvigil smaples had been dispensed to her but she ha not yet taken it. 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 In detail -discussion of diagnosis and treatment  takes place today , first with nuvigil and if insufficient, with XYREM. Her CPAP treats her OSA very well, and she uses it 6 hours  or more  each night,  residual AHI of 1.1  would not allow for OSA to be still explaining this degree of sleepiness. 2 downloads were reviewed. labs reviewed.    Patient is married [name]) and lives at home with her husband and grandchild.   Patient has four children   Patient is retired.   Patient has a college education.   Patient is right-handed.   Patient does not drink any caffeine.      Social Determinants of Health   Financial Resource Strain: Not on file  Food Insecurity: Low Risk  (09/13/2023)   Received from [facility]   Hunger Vital Sign    Worried About Running Out of Food in the Last Year: Never true    Ran Out of Food in the Last Year: Never true  Transportation Needs: No Transportation Needs (09/13/2023)   Received from [facility]    In the past 12 months, has lack of reliable transportation kept you from medical appointments, meetings, work or from getting things needed for daily living? : No  Physical Activity: Not on file  Stress: Not on file  Social Connections: Not on file    Allergies:  Allergies  Allergen Reactions   Morphine And Codeine Anaphylaxis   Lactose  Intolerance (Gi) Diarrhea and Nausea Only    Metabolic Disorder Labs: Lab Results  Component Value Date   HGBA1C 5.8 (H) 12/21/2022   MPG 119.76 12/21/2022   MPG 119.76 09/22/2022   No results found for: "PROLACTIN" No results found for: "CHOL", "TRIG", "HDL", "CHOLHDL", "VLDL", "LDLCALC" Lab Results  Component Value Date   TSH 1.220 06/30/2019    Therapeutic Level Labs: No results found for: "LITHIUM" No results found for: "VALPROATE" No results found for: "CBMZ"  Current Medications: Current Outpatient Medications  Medication Sig Dispense Refill   acetaminophen (TYLENOL) 500 MG tablet Take 1,000 mg by mouth every 6 (six) hours as needed for moderate pain.     acetaminophen (TYLENOL) 650 MG CR tablet Take 1,300 mg by mouth every 8 (eight) hours as needed for pain.     amLODipine (NORVASC) 10 MG tablet Take 10 mg by mouth in the morning.     Apoaequorin (PREVAGEN PO) Take 1 capsule by mouth daily.     carboxymethylcellulose (REFRESH PLUS) 0.5 % SOLN Place 1 drop into both eyes daily as needed (dry/irritated eyes).     cyanocobalamin (VITAMIN B12) 1000 MCG tablet Take 1,000 mcg by mouth daily.     escitalopram (LEXAPRO) 10 MG tablet 1 tablet Orally twice a day for 30 days     fidaxomicin (DIFICID) 200 MG TABS tablet Take 1 tablet (200 mg total) by mouth 2 (two) times daily. 20 tablet 0   gabapentin (NEURONTIN) 100 MG capsule Take 1 capsule (100 mg total) by mouth 2 (two) times daily. 200 capsule 1   Homeopathic Products (THERAWORX RELIEF EX) Apply 1 Application topically daily as needed (pain).     lipase/protease/amylase (CREON) 36000 UNITS CPEP capsule Take 36,000-72,000 Units by mouth See admin instructions. Take 16109 units with meals and 36000 units with snacks     Menthol, Topical Analgesic, (BIOFREEZE EX) Apply 1 Application topically daily as needed (pain).     metoprolol succinate (TOPROL-XL) 50 MG 24 hr tablet Take 50 mg by mouth in the morning.     pantoprazole  (PROTONIX) 40 MG tablet Take 1 tablet (40 mg total) by mouth daily. (Patient not taking: Reported on 12/20/2022) 30 tablet 11   TURMERIC  PO Take 1 capsule by mouth daily.     No current facility-administered medications for this visit.    Medication Side Effects: none  Orders placed this visit:  No orders of the defined types were placed in this encounter.   Psychiatric Specialty Exam:  Review of Systems  Musculoskeletal:  Negative for gait problem.  Neurological:  Negative for tremors.  Psychiatric/Behavioral:         Please refer to HPI    There were no vitals taken for this visit.There is no height or weight on file to calculate BMI.  General Appearance: Casual and Neat  Eye Contact:  Good  Speech:  Clear and Coherent and Normal Rate  Volume:  Normal  Mood:  Anxious  Affect:  Appropriate and Congruent  Thought Process:  Coherent and Descriptions of Associations: Intact  Orientation:  Full (Time, Place, and Person)  Thought Content: Logical   Suicidal Thoughts:  No  Homicidal Thoughts:  No  Memory:  WNL  Judgement:  Good  Insight:  Good  Psychomotor Activity:  Normal  Concentration:  Concentration: difficulties  and Attention Span: difficulties  Recall:  Fair  Fund of Knowledge: Good  Language: Good  Assets:  Communication Skills Desire for Improvement Financial Resources/Insurance Housing Intimacy Leisure Time Physical Health Resilience Social Support Talents/Skills Transportation Vocational/Educational  ADL's:  Intact  Cognition: WNL  Prognosis:  Good   Screenings:  Mini-Mental    Flowsheet Row Office Visit from 01/03/2021 in [facility] Office Visit from 04/05/2020 in [facility] Office Visit from 10/07/2019 in [facility] Office Visit from 06/30/2019 in [facility] Office Visit from 03/29/2015 in [facility]  Total Score (max 30 points ) 25 22 21 26 28       Flowsheet Row ED from 10/15/2023 in [facility] at [facility] ED from 08/25/2023 in [facility] Emergency Department at [facility] Admission (Discharged) from 01/02/2023 in [facility]  C-SSRS RISK CATEGORY No Risk No Risk No Risk       Receiving Psychotherapy: No   Treatment Plan/Recommendations:  Plan:  PDMP reviewed  Add Remeron 15mg  - 1/2 tablet at hs x 7 nights, then one tablet at hs.  RTC 4 weeks  Patient advised to contact office with any questions, adverse effects, or acute worsening in signs and symptoms.   Dorothyann Gibbs, NP

## 2023-10-31 DIAGNOSIS — R350 Frequency of micturition: Secondary | ICD-10-CM | POA: Diagnosis not present

## 2023-11-28 ENCOUNTER — Encounter: Payer: Self-pay | Admitting: Adult Health

## 2023-11-28 ENCOUNTER — Ambulatory Visit (INDEPENDENT_AMBULATORY_CARE_PROVIDER_SITE_OTHER): Payer: Medicare Other | Admitting: Adult Health

## 2023-11-28 DIAGNOSIS — G4709 Other insomnia: Secondary | ICD-10-CM | POA: Diagnosis not present

## 2023-11-28 DIAGNOSIS — F331 Major depressive disorder, recurrent, moderate: Secondary | ICD-10-CM | POA: Diagnosis not present

## 2023-11-28 MED ORDER — MIRTAZAPINE 15 MG PO TABS
15.0000 mg | ORAL_TABLET | Freq: Every day | ORAL | 2 refills | Status: DC
Start: 2023-11-28 — End: 2024-04-08

## 2023-11-28 NOTE — Progress Notes (Signed)
 Stephanie Barber [id number] 05-30-1941 83 y.o.  Subjective:   Patient ID:  Stephanie Barber is a 53 y.o. (DOB 06/26/1941) female.  Chief Complaint: No chief complaint on file.   HPI Stephanie Barber presents to the office today for follow-up of MDD and insomnia.  Accompanied by husband.  Describes mood today as "about the same". Pleasant. Denies tearfulness. Mood symptoms - reports  depression - feeling sad. Varying interest and motivation. Reports feeling anxious "sometimes". Reports some irritability. Denies panic attacks. Reports worry, rumination and over thinking. Mood remains lower. Stating "I do feel a little better". Feels like the addition of Remeron  has been helpful. Would like to work with a therapist. Energy levels vary - "ok, but not good". Active, does not have a regular exercise routine.  Enjoys some usual interests and activities. Married. Lives with husband of 62 years. Has 4 children. Spending time with family. Appetite improved - "getting better". Weight gain - 113 to 114 . Reports difficulties with sleep. Averages 3 or more hours - reports having a lot dreams. Reports getting up and down to the bathroom. Denies daytime napping.  Diagnosed with sleep apnea several years ago - had a CPAP machine previously. Reports focus and concentration difficulties. Completing tasks. Managing aspects of household. Raised 4 children - multiple jobs and talents. Denies SI or HI.  Denies AH or VH. Denies self harm.  Denies AH or VH.  Previous medication trials:  Lexapro   Mini-Mental    Flowsheet Row Office Visit from 10/22/2023 in [facility] Office Visit from 01/03/2021 in [facility] Office Visit from 04/05/2020 in [facility] Office Visit from 10/07/2019 in [facility] Office Visit from 06/30/2019 in [facility]  Total Score (max  30 points ) 21 25 22 21 26       Flowsheet Row ED from 10/15/2023 in [facility] at [facility] ED from 08/25/2023 in [facility] Department at [facility] Admission (Discharged) from 01/02/2023 in [facility]  C-SSRS RISK CATEGORY No Risk No Risk No Risk        Review of Systems:  Review of Systems  Musculoskeletal:  Negative for gait problem.  Neurological:  Negative for tremors.  Psychiatric/Behavioral:         Please refer to HPI    Medications: I have reviewed the patient's current medications.  Current Outpatient Medications  Medication Sig Dispense Refill   acetaminophen  (TYLENOL ) 500 MG tablet Take 1,000 mg by mouth every 6 (six) hours as needed for moderate pain.     acetaminophen  (TYLENOL ) 650 MG CR tablet Take 1,300 mg by mouth every 8 (eight) hours as needed for pain.     amLODipine  (NORVASC ) 10 MG tablet Take 10 mg by mouth in the morning.     Apoaequorin (PREVAGEN PO) Take 1 capsule by mouth daily.     carboxymethylcellulose (REFRESH PLUS) 0.5 % SOLN Place 1 drop into both eyes daily as needed (dry/irritated eyes).     cyanocobalamin  (VITAMIN B12) 1000 MCG tablet Take 1,000 mcg by mouth daily.     fidaxomicin  (DIFICID ) 200 MG TABS tablet Take 1 tablet (200 mg total) by mouth 2 (two) times daily. 20 tablet 0   gabapentin  (NEURONTIN ) 100 MG capsule Take 1 capsule (100 mg total) by mouth 2 (two) times daily. 200 capsule 1   Homeopathic Products (THERAWORX RELIEF EX) Apply 1 Application  topically daily as needed (pain).     lipase/protease/amylase (CREON ) 36000 UNITS CPEP capsule Take 36,000-72,000 Units by mouth See admin instructions. Take 72000 units with meals and 36000 units with snacks     Menthol, Topical Analgesic, (BIOFREEZE EX) Apply 1 Application topically daily as needed (pain).     metoprolol  succinate (TOPROL -XL) 50 MG 24 hr tablet Take 50 mg by mouth in the morning.     mirtazapine  (REMERON ) 15  MG tablet Take 1 tablet (15 mg total) by mouth at bedtime. 30 tablet 2   pantoprazole  (PROTONIX ) 40 MG tablet Take 1 tablet (40 mg total) by mouth daily. (Patient not taking: Reported on 12/20/2022) 30 tablet 11   TURMERIC PO Take 1 capsule by mouth daily.     No current facility-administered medications for this visit.    Medication Side Effects: None  Allergies:  Allergies  Allergen Reactions   Morphine And Codeine Anaphylaxis   Lactose Intolerance (Gi) Diarrhea and Nausea Only    Past Medical History:  Diagnosis Date   Arthritis    Chronic headache    Complication of anesthesia    allergy to Novocaine    Diabetes mellitus    GERD (gastroesophageal reflux disease)    GI bleed    Heart murmur    High cholesterol    Hyperlipidemia    Hypertension    Intracranial atherosclerosis    per MRA   Migraine    Narcolepsy and cataplexy    OSA    states has not used CPAP in over a year   Peripheral vascular disease (HCC)    Rectal prolapse    Stage 3b chronic kidney disease (CKD) (HCC)    Trigger finger of all digits of right hand     Past Medical History, Surgical history, Social history, and Family history were reviewed and updated as appropriate.   Please see review of systems for further details on the patient's review from today.   Objective:   Physical Exam:  There were no vitals taken for this visit.  Physical Exam Constitutional:      General: She is not in acute distress. Musculoskeletal:        General: No deformity.  Neurological:     Mental Status: She is alert and oriented to person, place, and time.     Coordination: Coordination normal.  Psychiatric:        Attention and Perception: Attention and perception normal. She does not perceive auditory or visual hallucinations.        Mood and Affect: Affect is not labile, blunt, angry or inappropriate.        Speech: Speech normal.        Behavior: Behavior normal.        Thought Content: Thought content  normal. Thought content is not paranoid or delusional. Thought content does not include homicidal or suicidal ideation. Thought content does not include homicidal or suicidal plan.        Cognition and Memory: Cognition and memory normal.        Judgment: Judgment normal.     Comments: Insight intact     Lab Review:     Component Value Date/Time   NA 139 10/15/2023 1805   K 4.1 10/15/2023 1805   CL 110 10/15/2023 1805   CO2 21 (L) 10/15/2023 1805   GLUCOSE 85 10/15/2023 1805   BUN 20 10/15/2023 1805   CREATININE 1.54 (H) 10/15/2023 1805   CREATININE 1.50 (H) 06/06/2023 1110  CALCIUM  9.3 10/15/2023 1805   PROT 7.1 06/06/2023 1110   ALBUMIN 4.3 06/06/2023 1110   AST 16 06/06/2023 1110   ALT 15 06/06/2023 1110   ALKPHOS 53 06/06/2023 1110   BILITOT 0.4 06/06/2023 1110   GFRNONAA 33 (L) 10/15/2023 1805   GFRNONAA 34 (L) 06/06/2023 1110   GFRAA 50 (L) 06/03/2019 1725       Component Value Date/Time   WBC 4.0 10/15/2023 1805   RBC 3.59 (L) 10/15/2023 1805   HGB 11.1 (L) 10/15/2023 1805   HGB 11.3 (L) 06/06/2023 1110   HCT 34.2 (L) 10/15/2023 1805   PLT 213 10/15/2023 1805   PLT 228 06/06/2023 1110   MCV 95.3 10/15/2023 1805   MCH 30.9 10/15/2023 1805   MCHC 32.5 10/15/2023 1805   RDW 12.6 10/15/2023 1805   LYMPHSABS 0.8 06/06/2023 1110   MONOABS 0.3 06/06/2023 1110   EOSABS 0.0 06/06/2023 1110   BASOSABS 0.0 06/06/2023 1110    No results found for: "POCLITH", "LITHIUM"   No results found for: "PHENYTOIN", "PHENOBARB", "VALPROATE", "CBMZ"   .res Assessment: Plan:    Treatment Plan/Recommendations:  Plan:  PDMP reviewed  Remeron  15mg  - one tablet at hs.  Refer to therapy - Reid Capuchin.  RTC 3 months  Patient advised to contact office with any questions, adverse effects, or acute worsening in signs and symptoms.  There are no diagnoses linked to this encounter.   Please see After Visit Summary for patient specific instructions.  Future Appointments   Date Time Provider Department Center  11/28/2023  1:00 PM Greer Koeppen, Ursula Gardner, NP CP-CP None  12/10/2023 12:30 PM [facility] LAB [facility] None  12/17/2023  2:40 PM Ander Bame, MD [facility] None  12/20/2023 10:40 AM Olinda Bertrand, DO [facility]  01/11/2024 10:30 AM Dillingham, Lindaann Requena, DO PSS-PSS None  01/17/2024  1:00 PM [facility] DX DEXA 1 [facility]    No orders of the defined types were placed in this encounter.   -------------------------------

## 2023-12-05 ENCOUNTER — Ambulatory Visit: Payer: Medicare Other | Admitting: Psychiatry

## 2023-12-05 DIAGNOSIS — F331 Major depressive disorder, recurrent, moderate: Secondary | ICD-10-CM

## 2023-12-05 NOTE — Progress Notes (Unsigned)
[facility] Initial Adult Exam  Name: Stephanie Barber Date: 12/06/2023 MRN: [id number] DOB: April 27, 1941 PCP: Deatra James, MD  Time spent: 55 minutes   Guardian/Payee:  patient    Paperwork requested:  No   Reason for Visit /Presenting Problem: depression, anxiety, denies any SI, "I feel morbid a lot, not really about me but more about others, and has been this way about the last 6 mos and has worsened. Worries about relatives and others dying. Her sleep medication is helping those thoughts and my sleep. No longer driving, for over the past year, but my husband drives me to wherever I need to go. Has lost some weight over past few months but have leveled out and "am at a good weight for my height right now." Does not have a "big appetite and not much taste"--reports her Dr is working with her on these things.   Mental Status Exam:    Appearance:   Neat and Well Groomed     Behavior:  Appropriate, Sharing, and Motivated  Motor:  Normal  Speech/Language:   Clear and Coherent  Affect:  Depressed and anxiety  Mood:  anxious, depressed, and sad  Thought process:  goal directed  Thought content:    Some obsessive thoughts and rumination  Sensory/Perceptual disturbances:    WNL  Orientation:  oriented to person, place, situation, day of week, month of year, year, and able to give date but slower and had to think about it  Attention:  Fair  Concentration:  Fair and Poor  Memory:  "I forget a lot and my Dr knows because we talk about it."  Fund of knowledge:   Fair  Insight:    Good and Fair  Judgment:   Good and Fair  Impulse Control:  Good   Reported Symptoms:  see symptoms above  Risk Assessment: Danger to Self:  No Self-injurious Behavior: No Danger to Others: No Duty to Warn:no Physical Aggression / Violence:No  Access to Firearms a concern: No  Gang Involvement:No  Patient / guardian was educated about steps to take if suicide or homicide risk level increases  between visits: yes, reports none today nor in recent days. While future psychiatric events cannot be accurately predicted, the patient does not currently require acute inpatient psychiatric care and does not currently meet [facility] involuntary commitment criteria.  Substance Abuse History: Current substance abuse: No     Past Psychiatric History:   No previous psychological problems have been observed Outpatient Providers: current med provider is Yvette Rack, DNP History of Psych Hospitalization: No  Psychological Testing:  n/a    Abuse History: Victim of No.,  n/a    Report needed: No. Victim of Neglect:No. Perpetrator of  n/a   Witness / Exposure to Domestic Violence: No   Protective Services Involvement: No  Witness to [employer] Violence:  No   Family History:  Reviewed with Patient  and she confirms Family History  Problem Relation Age of Onset   Heart disease Mother    Throat cancer Father    Cancer Father        stomach   Kidney cancer Brother    Heart disease Brother     Living situation: the patient lives with their spouse and her youngest son age (approx) 25. Has a total of 4 adult kids, ranging in "ages from mid 78's to upper 60's", 2 males and 2 females. All kids are in [place], [place], and [address]   Sexual Orientation:  Straight  Relationship Status: married for 61 years; Husband is in good health for his age of 89. Name of spouse / other:n/a             If a parent, number of children / ages:see above  Support Systems; spouse friends Adult kids Have been active but can't do quite as much as I used to do because of my age and some health issues  Financial Stress:  No   Income/Employment/Disability: Dance movement psychotherapist and Occupational psychologist Service: No   Educational History: Education:  2 yrs of college   Religion/Sprituality/World View:    Baptist  Any cultural differences that may affect / interfere with treatment:  not  applicable   Recreation/Hobbies: sewing, play cards, talk with friends, cooking  Stressors:Health problems   Other: concerned about my memory and my doctor is aware    Strengths:  Supportive Relationships, Family, Friends, Warehouse manager, Spirituality, Hopefulness, Journalist, newspaper, and Able to Communicate Effectively  Barriers:  "I don't feel like I have barriers."  Legal History: Pending legal issue / charges: The patient has no significant history of legal issues. History of legal issue / charges:  n/a  Medical History/Surgical History:Patient confirms info below. Past Medical History:  Diagnosis Date   Arthritis    Chronic headache    Complication of anesthesia    allergy to Novocaine    Diabetes mellitus    GERD (gastroesophageal reflux disease)    GI bleed    Heart murmur    High cholesterol    Hyperlipidemia    Hypertension    Intracranial atherosclerosis    per MRA   Migraine    Narcolepsy and cataplexy    OSA    states has not used CPAP in over a year   Peripheral vascular disease (HCC)    Rectal prolapse    Stage 3b chronic kidney disease (CKD) (HCC)    Trigger finger of all digits of right hand     Past Surgical History:  Procedure Laterality Date   ABDOMINAL HYSTERECTOMY     Fibroids   BACK SURGERY     L4,L5 discectomy   BOTOX INJECTION N/A 01/02/2023   Procedure: BOTOX INJECTION;  Surgeon: Noel Christmas, MD;  Location: [facility];  Service: Urology;  Laterality: N/A;   CARDIAC CATHETERIZATION     CARPAL TUNNEL RELEASE Right 01/18/2017   COLONOSCOPY N/A 07/28/2022   Procedure: COLONOSCOPY;  Surgeon: Vida Rigger, MD;  Location: [facility];  Service: Gastroenterology;  Laterality: N/A;   COLONOSCOPY WITH PROPOFOL N/A 06/17/2021   Procedure: COLONOSCOPY WITH PROPOFOL;  Surgeon: Charlott Rakes, MD;  Location: [facility];  Service: Endoscopy;  Laterality: N/A;   CYSTOSCOPY WITH INJECTION N/A 01/02/2023   Procedure: CYSTOSCOPY WITH Mariam Dollar;  Surgeon: Noel Christmas, MD;  Location: [facility];  Service: Urology;  Laterality: N/A;  45 MINS   ESOPHAGOGASTRODUODENOSCOPY (EGD) WITH PROPOFOL N/A 06/17/2021   Procedure: ESOPHAGOGASTRODUODENOSCOPY (EGD) WITH PROPOFOL;  Surgeon: Charlott Rakes, MD;  Location: [facility];  Service: Endoscopy;  Laterality: N/A;   KIDNEY SURGERY  1998   Right.  growth removed   POLYPECTOMY  06/17/2021   Procedure: POLYPECTOMY;  Surgeon: Charlott Rakes, MD;  Location: [facility];  Service: Endoscopy;;   POLYPECTOMY  07/28/2022   Procedure: POLYPECTOMY;  Surgeon: Vida Rigger, MD;  Location: [facility];  Service: Gastroenterology;;   rectal prolapse repair  12/05/2011   ROTATOR CUFF REPAIR     Left   SHOULDER ARTHROSCOPY WITH SUBACROMIAL DECOMPRESSION, ROTATOR CUFF  REPAIR AND BICEP TENDON REPAIR Left 05/25/2015   Procedure: SHOULDER ARTHROSCOPY WITH SUBACROMIAL DECOMPRESSION, ROTATOR CUFF REPAIR AND BICEP TENODESIS, DEBRIDEMENT. ;  Surgeon: Cammy Copa, MD;  Location: [facility];  Service: Orthopedics;  Laterality: Left;  LEFT SHOULDER ROTATOR CUFF TEAR REPAIR, ARTHROSCOPY, DEBRIDEMENT, BICEPS TENODESIS, SUBACROMIAL DECOMPRESSION.   TOTAL ABDOMINAL HYSTERECTOMY     TRIGGER FINGER RELEASE Right 04/30/2018   Procedure: RELEASE TRIGGER FINGER/A-1 PULLEY RIGHT MIDDLE  AND SMALLn ;  Surgeon: Cindee Salt, MD;  Location: [facility];  Service: Orthopedics;  Laterality: Right;  FAB, Bier block   TUBAL LIGATION      Medications: Current Outpatient Medications  Medication Sig Dispense Refill   acetaminophen (TYLENOL) 500 MG tablet Take 1,000 mg by mouth every 6 (six) hours as needed for moderate pain.     acetaminophen (TYLENOL) 650 MG CR tablet Take 1,300 mg by mouth every 8 (eight) hours as needed for pain.     amLODipine (NORVASC) 10 MG tablet Take 10 mg by mouth in the morning.     Apoaequorin (PREVAGEN PO) Take 1 capsule by mouth daily.     carboxymethylcellulose (REFRESH PLUS) 0.5 % SOLN Place 1 drop  into both eyes daily as needed (dry/irritated eyes).     cyanocobalamin (VITAMIN B12) 1000 MCG tablet Take 1,000 mcg by mouth daily.     fidaxomicin (DIFICID) 200 MG TABS tablet Take 1 tablet (200 mg total) by mouth 2 (two) times daily. 20 tablet 0   gabapentin (NEURONTIN) 100 MG capsule Take 1 capsule (100 mg total) by mouth 2 (two) times daily. 200 capsule 1   Homeopathic Products (THERAWORX RELIEF EX) Apply 1 Application topically daily as needed (pain).     lipase/protease/amylase (CREON) 36000 UNITS CPEP capsule Take 36,000-72,000 Units by mouth See admin instructions. Take 59563 units with meals and 36000 units with snacks     Menthol, Topical Analgesic, (BIOFREEZE EX) Apply 1 Application topically daily as needed (pain).     metoprolol succinate (TOPROL-XL) 50 MG 24 hr tablet Take 50 mg by mouth in the morning.     mirtazapine (REMERON) 15 MG tablet Take 1 tablet (15 mg total) by mouth at bedtime. 30 tablet 2   pantoprazole (PROTONIX) 40 MG tablet Take 1 tablet (40 mg total) by mouth daily. (Patient not taking: Reported on 12/20/2022) 30 tablet 11   TURMERIC PO Take 1 capsule by mouth daily.     No current facility-administered medications for this visit.    Allergies  Allergen Reactions   Morphine And Codeine Anaphylaxis   Lactose Intolerance (Gi) Diarrhea and Nausea Only    Diagnoses:    ICD-10-CM   1. Major depressive disorder, recurrent episode, moderate (HCC)  F33.1       Treatment Goal Plan of Care: Worked with patient collaboratively on her treatment plan and she is in agreement with it.  Her goals will remain on treatment plan as patient works with strategies in sessions and outside of sessions to meet her goals.  Progress is assessed each session and documented in the "plan" or "progress" sections of treatment note.  Elevate mood and show evidence of usual energy, activites, and socialization level.  Begin too experience sadness in session while discussing the  disappointment related to the loss or pain from the past. Encourage sharing feelings of depression in order to clarify them and gain insight as to causes.    Plan of Care: This is first therapy appointment with this patient and today we  collaboratively completed her initial evaluation and initial treatment goal plan.  Stephanie Barber is a pleasant 83 year old, married female who lives with her husband and her youngest son who is age 75.  She has a total of 4 adult kids ranging in ages from "69s up to upper 73s", including 2 males and 2 females.  Her children live in [place], [place], and [place].  Patient has no history of reported current substance abuse.  She reports her husband is very supportive of her.  Patient was referred by one of our med providers here in our practice, [address], [place] and [place].  Patient reports no history of abuse.  She reports no strong history of mental health concerns within the family.  Has been married for 61 years and husband is in good health at the age of 31.  Her support system includes her spouse, adult children, and friends.  Patient enjoys sewing, playing cards, talking with friends, and cooking.  States that she has occasionally some health problems but is more concerned about her memory and her doctor is aware.  She lists her strengths as being: Family, friends, church, a sense of spirituality, supportive relationships, is a good self advocate, able to communicate effectively, and a sense of hopefulness.  Patient stated that she does not feel she has any barriers to treatment.  She reports some "sadness and has previous diagnosis of some depression" which she openly shared.  States very clearly that she wants to "feel less depressed, to be able to change my sad/fearful thoughts", to get back where I have an open and clear mind and enjoy life more, and to rely on my Bible verses and praying that I live a long time but the craziness in the world gets me  down".  There very pleasant lady and certainly shows good motivation and an openness in sharing her concerns.  She presents neatly groomed, behavior is appropriate and motivated, motor skills fairly normal, speech and language clear and coherent, affect is depressed and anxious, mood is anxious and depressed with some sadness.  Some obsessive thoughts and rumination but tries to stay goal-directed.  She is well oriented to person place situation day of week month of year but was a little sure on exactly which year.  She does acknowledge frequent forgetfulness and states that she has spoken with her medical doctor about this and he is aware.  She feels that her impulse control is good.  Added towards end of session that "what I want is to be less sad, not think as much about death and other people dying, hope I keep treating my husband well as we have been together so long and do not want to be mean to him.  States that what she wants to change is: My fears, my thoughts.  Patient is clearly motivated for therapy, has good family support which is an added advantage for her.  Other details from her initial evaluation can be found in the full eversion of this document noted above.  Goal review with patient and she is in agreement.   Next appointment to be within 2 weeks.   Mathis Fare, LCSW

## 2023-12-07 ENCOUNTER — Other Ambulatory Visit: Payer: Self-pay | Admitting: *Deleted

## 2023-12-07 DIAGNOSIS — D500 Iron deficiency anemia secondary to blood loss (chronic): Secondary | ICD-10-CM

## 2023-12-10 ENCOUNTER — Inpatient Hospital Stay: Payer: Medicare Other | Attending: Hematology and Oncology

## 2023-12-17 ENCOUNTER — Inpatient Hospital Stay: Payer: Medicare Other | Attending: Hematology and Oncology | Admitting: Hematology and Oncology

## 2023-12-17 ENCOUNTER — Ambulatory Visit: Payer: Medicare Other | Admitting: Psychiatry

## 2023-12-17 NOTE — Progress Notes (Unsigned)
[facility] [phone]   Fax:([phone]) [phone]  PROGRESS NOTE  Patient Care Team: Deatra James, MD as PCP - General (Family Medicine) Bernette Redbird, MD as Consulting Physician (Gastroenterology) Clance, Maree Krabbe, MD as Consulting Physician (Pulmonary Disease)  Hematological/Oncological History  #Iron Deficiency Anemia of GI Origin 1) 05/24/2021: Labs from PCP, Dr. Elias Else showed WBC 5.0, Hgb 8.7 (L), MCV 85.2, Plt 277, Ferritin 6.2 (L), Iron 24 (L), Transferrin 315, Iron saturation 6%, TIBC 441.   2)Admitted from 06/15/2021-06/17/2021 for multiple episodes of rectal bleeding. Hgb was 8.3, Fecal occult blood test was positive. Patient was taking Xarelto for history of DVT, last dose 06/14/2021. CT scan showed some diverticulosis with no active bleeding. Underwent EGD and colonoscopy with no source of bleeding. Felt to be diverticular bleeding exacerbated by use of anticoagulation. Xarelto was stopped and received 3 units of pRBC. Hgb was 11.8.  3) Readmitted from 06/20/2021-06/23/2021 for drop in Hgb from 11.8 to 10.7. Recommended to initiate ferrous sulfate 325 mg once daily and to follow up with PCP.   4) 07/06/2021: Establish care with Georga Kaufmann PA-C  5) 07/27/2021-08/27/2021: Received IV venofer x 5 doses  HISTORY OF PRESENTING ILLNESS:  Stephanie Barber 83 y.o. female returns to the clinic for follow-up for iron deficiency anemia. She was last seen in the office on 06/13/2023.   Ms. Rhames reports ***.  She denies fevers, chills, night sweats, shortness of breath, chest pain or cough. She has no other complaints. Rest of the 10 point ROS  is below.    MEDICAL HISTORY:  Past Medical History:  Diagnosis Date   Arthritis    Chronic headache    Complication of anesthesia    allergy to Novocaine    Diabetes mellitus    GERD (gastroesophageal reflux disease)    GI bleed    Heart murmur    High cholesterol    Hyperlipidemia    Hypertension     Intracranial atherosclerosis    per MRA   Migraine    Narcolepsy and cataplexy    OSA    states has not used CPAP in over a year   Peripheral vascular disease (HCC)    Rectal prolapse    Stage 3b chronic kidney disease (CKD) (HCC)    Trigger finger of all digits of right hand     SURGICAL HISTORY: Past Surgical History:  Procedure Laterality Date   ABDOMINAL HYSTERECTOMY     Fibroids   BACK SURGERY     L4,L5 discectomy   BOTOX INJECTION N/A 01/02/2023   Procedure: BOTOX INJECTION;  Surgeon: Noel Christmas, MD;  Location: [facility];  Service: Urology;  Laterality: N/A;   CARDIAC CATHETERIZATION     CARPAL TUNNEL RELEASE Right 01/18/2017   COLONOSCOPY N/A 07/28/2022   Procedure: COLONOSCOPY;  Surgeon: Vida Rigger, MD;  Location: [facility];  Service: Gastroenterology;  Laterality: N/A;   COLONOSCOPY WITH PROPOFOL N/A 06/17/2021   Procedure: COLONOSCOPY WITH PROPOFOL;  Surgeon: Charlott Rakes, MD;  Location: [facility];  Service: Endoscopy;  Laterality: N/A;   CYSTOSCOPY WITH INJECTION N/A 01/02/2023   Procedure: CYSTOSCOPY WITH Mariam Dollar;  Surgeon: Noel Christmas, MD;  Location: [facility];  Service: Urology;  Laterality: N/A;  45 MINS   ESOPHAGOGASTRODUODENOSCOPY (EGD) WITH PROPOFOL N/A 06/17/2021   Procedure: ESOPHAGOGASTRODUODENOSCOPY (EGD) WITH PROPOFOL;  Surgeon: Charlott Rakes, MD;  Location: [facility];  Service: Endoscopy;  Laterality: N/A;   KIDNEY SURGERY  1998   Right.  growth removed  POLYPECTOMY  06/17/2021   Procedure: POLYPECTOMY;  Surgeon: Charlott Rakes, MD;  Location: [facility];  Service: Endoscopy;;   POLYPECTOMY  07/28/2022   Procedure: POLYPECTOMY;  Surgeon: Vida Rigger, MD;  Location: [facility];  Service: Gastroenterology;;   rectal prolapse repair  12/05/2011   ROTATOR CUFF REPAIR     Left   SHOULDER ARTHROSCOPY WITH SUBACROMIAL DECOMPRESSION, ROTATOR CUFF REPAIR AND BICEP TENDON REPAIR Left 05/25/2015   Procedure: SHOULDER ARTHROSCOPY  WITH SUBACROMIAL DECOMPRESSION, ROTATOR CUFF REPAIR AND BICEP TENODESIS, DEBRIDEMENT. ;  Surgeon: Cammy Copa, MD;  Location: [facility];  Service: Orthopedics;  Laterality: Left;  LEFT SHOULDER ROTATOR CUFF TEAR REPAIR, ARTHROSCOPY, DEBRIDEMENT, BICEPS TENODESIS, SUBACROMIAL DECOMPRESSION.   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  Tobacco Use   Smoking status: Former    Current packs/day: 0.00    Types: Cigarettes    Quit date: 11/13/1980    Years since quitting: 43.1   Smokeless tobacco: Never   Tobacco comments:    quit in 1982  Vaping Use   Vaping status: Never Used  Substance and Sexual Activity   Alcohol use: No    Alcohol/week: 0.0 standard drinks of alcohol   Drug use: No   Sexual activity: Yes  Other Topics Concern   Not on file  Social History Narrative   21 st January 2014 , patient underwent PS and MSLT - MSLT had one  SREMs and an average time to fall asleep of  *.8 minutes , her Ewort is 20 points,: facit: this patient has severe hypersomnia and is at risk when driving. medication in form ogf nuvigil smaples had been dispensed to her but she ha not yet taken it. 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 In detail -discussion of diagnosis and treatment  takes place today , first with nuvigil and if  insufficient, with XYREM. Her CPAP treats her OSA very well, and she uses it 6 hours  or more each night,  residual AHI of 1.1  would not allow for OSA to be still explaining this degree of sleepiness. 2 downloads were reviewed. labs reviewed.    Patient is married [name]) and lives at home with her husband and grandchild.   Patient has four children   Patient is retired.   Patient has a college education.   Patient is right-handed.   Patient does not drink any caffeine.      Social Drivers of Corporate investment banker Strain: Not on file  Food Insecurity: Low Risk  (09/13/2023)   Received from [facility]   Hunger Vital Sign    Worried About Running Out of Food in the Last Year: Never true    Ran Out of Food in the Last Year: Never  true  Transportation Needs: No Transportation Needs (09/13/2023)   Received from [facility]    In the past 12 months, has lack of reliable transportation kept you from medical appointments, meetings, work or from getting things needed for daily living? : No  Physical Activity: Not on file  Stress: Not on file  Social Connections: Not on file  Intimate Partner Violence: Not At Risk (09/20/2022)   Humiliation, Afraid, Rape, and Kick questionnaire    Fear of Current or Ex-Partner: No    Emotionally Abused: No    Physically Abused: No    Sexually Abused: No    FAMILY HISTORY: Family History  Problem Relation Age of Onset   Heart disease Mother    Throat cancer Father    Cancer Father        stomach   Kidney cancer Brother    Heart disease Brother     ALLERGIES:  is allergic to morphine and codeine and lactose intolerance (gi).  MEDICATIONS:  Current Outpatient Medications  Medication Sig Dispense Refill   acetaminophen (TYLENOL) 500 MG tablet Take 1,000 mg by mouth every 6 (six) hours as needed for moderate pain.     acetaminophen (TYLENOL) 650 MG CR tablet Take 1,300 mg by mouth every 8 (eight) hours as needed for  pain.     amLODipine (NORVASC) 10 MG tablet Take 10 mg by mouth in the morning.     Apoaequorin (PREVAGEN PO) Take 1 capsule by mouth daily.     carboxymethylcellulose (REFRESH PLUS) 0.5 % SOLN Place 1 drop into both eyes daily as needed (dry/irritated eyes).     cyanocobalamin (VITAMIN B12) 1000 MCG tablet Take 1,000 mcg by mouth daily.     fidaxomicin (DIFICID) 200 MG TABS tablet Take 1 tablet (200 mg total) by mouth 2 (two) times daily. 20 tablet 0   gabapentin (NEURONTIN) 100 MG capsule Take 1 capsule (100 mg total) by mouth 2 (two) times daily. 200 capsule 1   Homeopathic Products (THERAWORX RELIEF EX) Apply 1 Application topically daily as needed (pain).     lipase/protease/amylase (CREON) 36000 UNITS CPEP capsule Take 36,000-72,000 Units by mouth See admin instructions. Take 16109 units with meals and 36000 units with snacks     Menthol, Topical Analgesic, (BIOFREEZE EX) Apply 1 Application topically daily as needed (pain).     metoprolol succinate (TOPROL-XL) 50 MG 24 hr tablet Take 50 mg by mouth in the morning.     mirtazapine (REMERON) 15 MG tablet Take 1 tablet (15 mg total) by mouth at bedtime. 30 tablet 2   pantoprazole (PROTONIX) 40 MG tablet Take 1 tablet (40 mg total) by mouth daily. (Patient not taking: Reported on 12/20/2022) 30 tablet 11   TURMERIC PO Take 1 capsule by mouth daily.     No current facility-administered medications for this visit.    REVIEW OF SYSTEMS:   Constitutional: ( - ) fevers, ( - )  chills , ( - ) night sweats Eyes: ( - ) blurriness of vision, ( - ) double vision, ( - ) watery eyes Ears, nose, mouth, throat, and face: ( - ) mucositis, ( - ) sore throat Respiratory: ( - ) cough, ( - ) dyspnea, ( - ) wheezes Cardiovascular: ( - ) palpitation, ( - ) chest discomfort, ( - ) lower extremity swelling Gastrointestinal:  ( - ) nausea, ( - ) heartburn, ( - ) change in bowel habits Skin: ( - ) abnormal skin rashes Lymphatics: ( - )  new lymphadenopathy, ( - )  easy bruising Neurological: ( - ) numbness, ( - ) tingling, ( - ) new weaknesses Behavioral/Psych: ( - ) mood change, ( - ) new changes  All other systems were reviewed with the patient and are negative.  PHYSICAL EXAMINATION: ECOG PERFORMANCE STATUS: 0 - Asymptomatic  There were no vitals filed for this visit.  There were no vitals filed for this visit.   GENERAL: well appearing female in NAD  SKIN: skin color, texture, turgor are normal, no rashes or significant lesions EYES: conjunctiva are pink and non-injected, sclera clear.  LUNGS: clear to auscultation and percussion with normal breathing effort HEART: regular rate & rhythm and no murmurs and no lower extremity edema Musculoskeletal: no cyanosis of digits and no clubbing  PSYCH: alert & oriented x 3, fluent speech NEURO: no focal motor/sensory deficits  LABORATORY DATA:  I have reviewed the data as listed    Latest Ref Rng & Units 10/15/2023    6:05 PM 06/06/2023   11:10 AM 12/13/2022    2:37 PM  CBC  WBC 4.0 - 10.5 K/uL 4.0  5.6  3.4   Hemoglobin 12.0 - 15.0 g/dL 78.4  69.6  29.5   Hematocrit 36.0 - 46.0 % 34.2  32.4  36.6   Platelets 150 - 400 K/uL 213  228  203        Latest Ref Rng & Units 10/15/2023    6:05 PM 06/06/2023   11:10 AM 12/13/2022    2:37 PM  CMP  Glucose 70 - 99 mg/dL 85  284  132   BUN 8 - 23 mg/dL 20  26  28    Creatinine 0.44 - 1.00 mg/dL 4.40  1.02  7.25   Sodium 135 - 145 mmol/L 139  142  142   Potassium 3.5 - 5.1 mmol/L 4.1  4.3  4.4   Chloride 98 - 111 mmol/L 110  109  108   CO2 22 - 32 mmol/L 21  25  24    Calcium 8.9 - 10.3 mg/dL 9.3  9.9  9.3   Total Protein 6.5 - 8.1 g/dL  7.1  8.2   Total Bilirubin 0.3 - 1.2 mg/dL  0.4  0.4   Alkaline Phos 38 - 126 U/L  53  56   AST 15 - 41 U/L  16  29   ALT 0 - 44 U/L  15  28     RADIOGRAPHIC STUDIES: I have personally reviewed the radiological images as listed and agreed with the findings in the report. No results found.  ASSESSMENT &  PLAN Stephanie Barber is a 83 y.o. female returns for a follow up for iron deficiency anemia.   #Iron deficiency anemia 2/2 GI bleeding: --Under the care of [facility] with Celso Amy PA-C. Most recent EGD and colonoscopy from 06/17/2021 with no signs of active bleeding. Requested follow up due to recent episode of rectal bleeding in May 2023.  --Recommend to continue to incorporate iron rich foods into diet.  --Received IV venofer x 5 doses from 07/27/2021-08/27/2021.  --Labs today shows Hgb *** --continue ferrous sulfate 325 mg once daily with a source of vitamin C --RTC in 6 months with repeat labs.   # CKD stage 3 -- Patient creatinine at 1.5 today, chronically elevated. -- Patient is currently under the care of urology for urinary incontinence. -- Patient notes that she does not have a nephrologist.  Will make a referral to nephrology today.  #  DVT of right femoral vein: --Diagnosed in 06/03/2019. CTA negative for PE. Started on Xarelto.  --Discontinued with during  hospitalization for GI bleed.   #Appetite loss/lethargy: --weight stable at 112 lbs.  --Continue to monitor.   No orders of the defined types were placed in this encounter.   All questions were answered. The patient knows to call the clinic with any problems, questions or concerns.  I have spent a total of 30 minutes minutes of face-to-face and non-face-to-face time, preparing to see the patient, performing a medically appropriate examination, counseling and educating the patient, documenting clinical information in the electronic health record, and care coordination.    Ulysees Barns, MD Department of Hematology/Oncology [facility] Phone: [phone] Pager: [phone] Email: [email or web address]

## 2023-12-20 ENCOUNTER — Encounter: Payer: Self-pay | Admitting: Physician Assistant

## 2023-12-20 ENCOUNTER — Encounter: Payer: Self-pay | Admitting: Cardiology

## 2023-12-20 ENCOUNTER — Ambulatory Visit: Payer: Medicare Other | Attending: Cardiology | Admitting: Cardiology

## 2023-12-20 VITALS — BP 130/68 | HR 83 | Resp 16 | Ht 59.0 in | Wt 115.0 lb

## 2023-12-20 DIAGNOSIS — R072 Precordial pain: Secondary | ICD-10-CM

## 2023-12-20 DIAGNOSIS — E119 Type 2 diabetes mellitus without complications: Secondary | ICD-10-CM | POA: Diagnosis not present

## 2023-12-20 DIAGNOSIS — E7800 Pure hypercholesterolemia, unspecified: Secondary | ICD-10-CM | POA: Diagnosis not present

## 2023-12-20 DIAGNOSIS — I10 Essential (primary) hypertension: Secondary | ICD-10-CM | POA: Diagnosis not present

## 2023-12-20 MED ORDER — ROSUVASTATIN CALCIUM 40 MG PO TABS: 40.0000 mg | ORAL_TABLET | Freq: Every evening | ORAL | 3 refills | Status: DC

## 2023-12-20 NOTE — Patient Instructions (Signed)
 Medication Instructions:  START Crestor  40 mg take one tablet by mouth at bedtime.   *If you need a refill on your cardiac medications before your next appointment, please call your pharmacy*   Lab Work IN 6 WEEKS: Lipid Panel   Testing/Procedures: Echo  Your physician has requested that you have an echocardiogram. Echocardiography is a painless test that uses sound waves to create images of your heart. It provides your doctor with information about the size and shape of your heart and how well your heart's chambers and valves are working. This procedure takes approximately one hour. There are no restrictions for this procedure. Please do NOT wear cologne, perfume, aftershave, or lotions (deodorant is allowed). Please arrive 15 minutes prior to your appointment time.  Please note: We ask at that you not bring children with you during ultrasound (echo/ vascular) testing. Due to room size and safety concerns, children are not allowed in the ultrasound rooms during exams. Our front office staff cannot provide observation of children in our lobby area while testing is being conducted. An adult accompanying a patient to their appointment will only be allowed in the ultrasound room at the discretion of the ultrasound technician under special circumstances. We apologize for any inconvenience.  Calcium  Score CT   Non-Cardiac CT scanning, (CAT scanning), is a noninvasive, special x-ray that produces cross-sectional images of the body using x-rays and a computer. CT scans help physicians diagnose and treat medical conditions. For some CT exams, a contrast material is used to enhance visibility in the area of the body being studied. CT scans provide greater clarity and reveal more details than regular x-ray exams.    Follow-Up: At [facility], you and your health needs are our priority.  As part of our continuing mission to provide you with exceptional heart care, we have created designated  Provider Care Teams.  These Care Teams include your primary Cardiologist (physician) and Advanced Practice Providers (APPs -  Physician Assistants and Nurse Practitioners) who all work together to provide you with the care you need, when you need it.  We recommend signing up for the patient portal called MyChart.  Sign up information is provided on this After Visit Summary.  MyChart is used to connect with patients for Virtual Visits (Telemedicine).  Patients are able to view lab/test results, encounter notes, upcoming appointments, etc.  Non-urgent messages can be sent to your provider as well.   To learn more about what you can do with MyChart, go to [email or web address].    Your next appointment:   6 month(s)  Provider:   Madonna Large, DO     Other Instructions   1st Floor: - Lobby - Registration  - Pharmacy  - Lab - Cafe  2nd Floor: - PV Lab - Diagnostic Testing (echo, CT, nuclear med)  3rd Floor: - Vacant  4th Floor: - TCTS (cardiothoracic surgery) - AFib Clinic - Structural Heart Clinic - Vascular Surgery  - Vascular Ultrasound  5th Floor: - [facility]Care Cardiology (general and EP) - Clinical Pharmacy for coumadin, hypertension, lipid, weight-loss medications, and med management appointments    Valet parking services will be available as well.

## 2023-12-20 NOTE — Progress Notes (Signed)
 Cardiology Office Note:    Date:  12/20/2023  NAME:  Stephanie Barber    MRN: [id number] DOB:  Aug 26, 1941   PCP:  Barber, Vyvyan, MD  Former Cardiology Providers: N/A Primary Cardiologist:  Madonna Large, DO, [facility] (established care 12/20/2023) Electrophysiologist:  None   Referring MD: Barber, Vyvyan, MD  Reason of Consult: Chest Pain  Chief Complaint  Patient presents with   Chest Pain   New Patient (Initial Visit)    History of Present Illness:    Stephanie Barber is a 83 y.o. African-American female whose past medical history and cardiovascular risk factors includes: Diabetes, hypertension, hyperlipidemia, family hx of heart disease, . She is being seen today for the evaluation of chest pain at the request of Barber, Vyvyan, MD.  Patient is accompanied by her husband at today's office visit.  Chest pain: Index event was in December 2024 when she went to the ED for evaluation and management. She does not recall the course of events that occurred in December but reviewing electronic medical records it appears that she was developed anterior precordial discomfort which she describes as a pulling like sensation over the left side of the chest and was feeling a bit weak.  The symptoms resolved within 45 minutes.  High sensitive troponins were negative x 2 and patient was referred to outpatient cardiology for further evaluation and management.  Since her event in December 2024 she has not had any reoccurrence of chest pain.  Patient states that she just hurts all over which is more noticeable at night.  But eventually she falls asleep and wakes up the next day with similar symptoms.  No change in overall physical endurance or functional capacity.  She still enjoys her stationary bike 20 minutes a day without any exertional symptoms.  No known history of myocardial infarction, CAD, coronary interventions, strokes or TIA.  Current Medications: Current Meds  Medication Sig   acetaminophen   (TYLENOL ) 500 MG tablet Take 1,000 mg by mouth every 6 (six) hours as needed for moderate pain.   acetaminophen  (TYLENOL ) 650 MG CR tablet Take 1,300 mg by mouth every 8 (eight) hours as needed for pain.   amLODipine  (NORVASC ) 10 MG tablet Take 10 mg by mouth in the morning.   Apoaequorin (PREVAGEN PO) Take 1 capsule by mouth daily.   carboxymethylcellulose (REFRESH PLUS) 0.5 % SOLN Place 1 drop into both eyes daily as needed (dry/irritated eyes).   cyanocobalamin  (VITAMIN B12) 1000 MCG tablet Take 1,000 mcg by mouth daily.   fidaxomicin  (DIFICID ) 200 MG TABS tablet Take 1 tablet (200 mg total) by mouth 2 (two) times daily.   gabapentin  (NEURONTIN ) 100 MG capsule Take 1 capsule (100 mg total) by mouth 2 (two) times daily.   Homeopathic Products (THERAWORX RELIEF EX) Apply 1 Application topically daily as needed (pain).   lipase/protease/amylase (CREON ) 36000 UNITS CPEP capsule Take 36,000-72,000 Units by mouth See admin instructions. Take 72000 units with meals and 36000 units with snacks   Menthol, Topical Analgesic, (BIOFREEZE EX) Apply 1 Application topically daily as needed (pain).   metoprolol  succinate (TOPROL -XL) 50 MG 24 hr tablet Take 50 mg by mouth in the morning.   mirtazapine  (REMERON ) 15 MG tablet Take 1 tablet (15 mg total) by mouth at bedtime.   pantoprazole  (PROTONIX ) 40 MG tablet Take 1 tablet (40 mg total) by mouth daily.   rosuvastatin  (CRESTOR ) 40 MG tablet Take 1 tablet (40 mg total) by mouth at bedtime.   TURMERIC PO Take  1 capsule by mouth daily.     Allergies:    Morphine and codeine and Lactose intolerance (gi)   Past Medical History: Past Medical History:  Diagnosis Date   Arthritis    Chronic headache    Complication of anesthesia    allergy to Novocaine    Diabetes mellitus    GERD (gastroesophageal reflux disease)    GI bleed    Heart murmur    High cholesterol    Hyperlipidemia    Hypertension    Intracranial atherosclerosis    per MRA   Migraine     Narcolepsy and cataplexy    OSA    states has not used CPAP in over a year   Peripheral vascular disease (HCC)    Rectal prolapse    Stage 3b chronic kidney disease (CKD) (HCC)    Trigger finger of all digits of right hand     Past Surgical History: Past Surgical History:  Procedure Laterality Date   ABDOMINAL HYSTERECTOMY     Fibroids   BACK SURGERY     L4,L5 discectomy   BOTOX  INJECTION N/A 01/02/2023   Procedure: BOTOX  INJECTION;  Surgeon: Elisabeth Valli BIRCH, MD;  Location: [facility];  Service: Urology;  Laterality: N/A;   CARDIAC CATHETERIZATION     CARPAL TUNNEL RELEASE Right 01/18/2017   COLONOSCOPY N/A 07/28/2022   Procedure: COLONOSCOPY;  Surgeon: Rosalie Kitchens, MD;  Location: [facility];  Service: Gastroenterology;  Laterality: N/A;   COLONOSCOPY WITH PROPOFOL  N/A 06/17/2021   Procedure: COLONOSCOPY WITH PROPOFOL ;  Surgeon: Dianna Specking, MD;  Location: [facility];  Service: Endoscopy;  Laterality: N/A;   CYSTOSCOPY WITH INJECTION N/A 01/02/2023   Procedure: CYSTOSCOPY WITH LUEVENIA;  Surgeon: Elisabeth Valli BIRCH, MD;  Location: [facility];  Service: Urology;  Laterality: N/A;  45 MINS   ESOPHAGOGASTRODUODENOSCOPY (EGD) WITH PROPOFOL  N/A 06/17/2021   Procedure: ESOPHAGOGASTRODUODENOSCOPY (EGD) WITH PROPOFOL ;  Surgeon: Dianna Specking, MD;  Location: [facility];  Service: Endoscopy;  Laterality: N/A;   KIDNEY SURGERY  1998   Right.  growth removed   POLYPECTOMY  06/17/2021   Procedure: POLYPECTOMY;  Surgeon: Dianna Specking, MD;  Location: [facility];  Service: Endoscopy;;   POLYPECTOMY  07/28/2022   Procedure: POLYPECTOMY;  Surgeon: Rosalie Kitchens, MD;  Location: [facility];  Service: Gastroenterology;;   rectal prolapse repair  12/05/2011   ROTATOR CUFF REPAIR     Left   SHOULDER ARTHROSCOPY WITH SUBACROMIAL DECOMPRESSION, ROTATOR CUFF REPAIR AND BICEP TENDON REPAIR Left 05/25/2015   Procedure: SHOULDER ARTHROSCOPY WITH SUBACROMIAL DECOMPRESSION, ROTATOR CUFF REPAIR AND  BICEP TENODESIS, DEBRIDEMENT. ;  Surgeon: Glendia Cordella Hutchinson, MD;  Location: [facility];  Service: Orthopedics;  Laterality: Left;  LEFT SHOULDER ROTATOR CUFF TEAR REPAIR, ARTHROSCOPY, DEBRIDEMENT, BICEPS TENODESIS, SUBACROMIAL DECOMPRESSION.   TOTAL ABDOMINAL HYSTERECTOMY     TRIGGER FINGER RELEASE Right 04/30/2018   Procedure: RELEASE TRIGGER FINGER/A-1 PULLEY RIGHT MIDDLE  AND SMALLn ;  Surgeon: Murrell Kuba, MD;  Location: [facility];  Service: Orthopedics;  Laterality: Right;  FAB, Bier block   TUBAL LIGATION      Social History: Social History   Tobacco Use   Smoking status: Former    Current packs/day: 0.00    Types: Cigarettes    Quit date: 11/13/1980    Years since quitting: 43.1   Smokeless tobacco: Never   Tobacco comments:    quit in 1982  Vaping Use   Vaping status: Never Used  Substance Use Topics   Alcohol  use:  No    Alcohol /week: 0.0 standard drinks of alcohol    Drug use: No    Family History: Family History  Problem Relation Age of Onset   Heart disease Mother    Throat cancer Father    Cancer Father        stomach   Kidney cancer Brother    Heart disease Brother     ROS:   Review of Systems  Cardiovascular:  Positive for chest pain (See HPI). Negative for claudication, irregular heartbeat, leg swelling, near-syncope, orthopnea, palpitations, paroxysmal nocturnal dyspnea and syncope.  Respiratory:  Negative for shortness of breath.   Hematologic/Lymphatic: Negative for bleeding problem.    EKGs/Labs/Other Studies Reviewed:   EKG: EKG Interpretation Date/Time:  Thursday December 20 2023 10:56:40 EST Ventricular Rate:  68 PR Interval:  152 QRS Duration:  66 QT Interval:  366 QTC Calculation: 389 R Axis:   23  Text Interpretation: Normal sinus rhythm Low voltage QRS Cannot rule out Anterior infarct , age undetermined When compared with ECG of 15-Oct-2023 17:45, No significant change since last tracing Confirmed by Michele Richardson [phone]) on  12/20/2023 10:58:07 AM  Echocardiogram: June 20 18: LVEF 60 to 65%, see report for additional details  Labs:    Latest Ref Rng & Units 10/15/2023    6:05 PM 06/06/2023   11:10 AM 12/13/2022    2:37 PM  CBC  WBC 4.0 - 10.5 K/uL 4.0  5.6  3.4   Hemoglobin 12.0 - 15.0 g/dL 88.8  88.6  87.9   Hematocrit 36.0 - 46.0 % 34.2  32.4  36.6   Platelets 150 - 400 K/uL 213  228  203        Latest Ref Rng & Units 10/15/2023    6:05 PM 06/06/2023   11:10 AM 12/13/2022    2:37 PM  BMP  Glucose 70 - 99 mg/dL 85  854  894   BUN 8 - 23 mg/dL 20  26  28    Creatinine 0.44 - 1.00 mg/dL 8.45  8.49  8.27   Sodium 135 - 145 mmol/L 139  142  142   Potassium 3.5 - 5.1 mmol/L 4.1  4.3  4.4   Chloride 98 - 111 mmol/L 110  109  108   CO2 22 - 32 mmol/L 21  25  24    Calcium  8.9 - 10.3 mg/dL 9.3  9.9  9.3       Latest Ref Rng & Units 10/15/2023    6:05 PM 06/06/2023   11:10 AM 12/13/2022    2:37 PM  CMP  Glucose 70 - 99 mg/dL 85  854  894   BUN 8 - 23 mg/dL 20  26  28    Creatinine 0.44 - 1.00 mg/dL 8.45  8.49  8.27   Sodium 135 - 145 mmol/L 139  142  142   Potassium 3.5 - 5.1 mmol/L 4.1  4.3  4.4   Chloride 98 - 111 mmol/L 110  109  108   CO2 22 - 32 mmol/L 21  25  24    Calcium  8.9 - 10.3 mg/dL 9.3  9.9  9.3   Total Protein 6.5 - 8.1 g/dL  7.1  8.2   Total Bilirubin 0.3 - 1.2 mg/dL  0.4  0.4   Alkaline Phos 38 - 126 U/L  53  56   AST 15 - 41 U/L  16  29   ALT 0 - 44 U/L  15  28     No results  found for: CHOL, HDL, LDLCALC, LDLDIRECT, TRIG, CHOLHDL No results for input(s): LIPOA in the last 8760 hours. No components found for: NTPROBNP No results for input(s): PROBNP in the last 8760 hours. No results for input(s): TSH in the last 8760 hours.  External Labs: Collected: October 17, 2023 [facility] physicians. Total cholesterol 242, triglycerides 224, HDL 48, LDL calculated 153, non-HDL 194  Physical Exam:    Today's Vitals   12/20/23 1052  BP: 130/68  Pulse: 83  Resp: 16   SpO2: 99%  Weight: 115 lb (52.2 kg)  Height: 4' 11 (1.499 m)   Body mass index is 23.23 kg/m. Wt Readings from Last 3 Encounters:  12/20/23 115 lb (52.2 kg)  10/15/23 112 lb 7 oz (51 kg)  08/25/23 112 lb 7 oz (51 kg)    Physical Exam  Constitutional: No distress.  hemodynamically stable, uses a walker for ambulation, age-appropriate  Neck: No JVD present.  Cardiovascular: Normal rate, regular rhythm, S1 normal and S2 normal. Exam reveals no gallop, no S3 and no S4.  No murmur heard. Pulmonary/Chest: Effort normal and breath sounds normal. No stridor. She has no wheezes. She has no rales.  Abdominal: Soft. Bowel sounds are normal. She exhibits no distension. There is no abdominal tenderness.  Musculoskeletal:        General: No edema.     Cervical back: Neck supple.  Neurological: She is alert and oriented to person, place, and time. She has intact cranial nerves (2-12).  Skin: Skin is warm.     Impression & Recommendation(s):  Impression:   ICD-10-CM   1. Precordial pain  R07.2 EKG 12-Lead    ECHOCARDIOGRAM COMPLETE    CT CARDIAC SCORING (SELF PAY ONLY)    CANCELED: CT CARDIAC SCORING (SELF PAY ONLY)    2. Pure hypercholesterolemia  E78.00 rosuvastatin  (CRESTOR ) 40 MG tablet    Lipid panel    Lipid panel    Comprehensive metabolic panel    Comprehensive metabolic panel    3. Non-insulin  dependent type 2 diabetes mellitus (HCC)  E11.9     4. Benign hypertension  I10        Recommendation(s):  Precordial pain Index event was in December 2024. No reoccurrence of anginal chest pain and her index event appears to be noncardiac based on EMR and patient's recollection. Given her advanced age and multiple risk factors shared decision was to proceed forward with an echocardiogram and coronary calcium  score for further risk stratification. Based on the results of the CAC and echocardiogram will decide the role of stress testing at this point versus wait for  watching/reevaluating symptoms if they resurface.  Patient and husband are agreeable with the plan of care.  Pure hypercholesterolemia Outside labs from December 2024 independently reviewed, LDL 153 mg/dL Was on atorvastatin in the past but discontinued for reasons unknown. Patient does not endorse any history of statin intolerance. Will start Crestor  40 mg p.o. nightly and fasting lipids in 6 weeks. Recommend a goal LDL at least <70 mg/dL given her diabetes.  Non-insulin  dependent type 2 diabetes mellitus (HCC) Used to be on metformin  medication was discontinued secondary to loose bowel movements. Reemphasized importance of glycemic control. Currently managed by primary care provider.  Benign hypertension Office blood pressures are well-controlled. Continue amlodipine  10 mg p.o. daily. Continue Toprol -XL 50 mg p.o. daily. Reemphasized importance of low-salt diet.   Orders Placed:  Orders Placed This Encounter  Procedures   CT CARDIAC SCORING (SELF PAY ONLY)    Standing  Status:   Future    Expiration Date:   12/19/2024    Preferred imaging location?:   [place]   Lipid panel    Standing Status:   Future    Number of Occurrences:   1    Expected Date:   01/31/2024    Expiration Date:   12/19/2024   Comprehensive metabolic panel    Standing Status:   Future    Number of Occurrences:   1    Expected Date:   01/31/2024    Expiration Date:   12/19/2024   EKG 12-Lead   ECHOCARDIOGRAM COMPLETE    Standing Status:   Future    Expiration Date:   12/19/2024    Where should this test be performed:   [facility])    Does the patient weigh less than or greater than 250 lbs?:   Patient weighs less than 250 lbs    Perflutren  DEFINITY  (image enhancing agent) should be administered unless hypersensitivity or allergy exist:   Administer Perflutren     Reason for exam-Echo:   Other-Full Diagnosis List    Full ICD-10/Reason for Exam:   Precordial chest pain [[id number]]     Final Medication List:    Meds ordered this encounter  Medications   rosuvastatin  (CRESTOR ) 40 MG tablet    Sig: Take 1 tablet (40 mg total) by mouth at bedtime.    Dispense:  90 tablet    Refill:  3    There are no discontinued medications.   Current Outpatient Medications:    acetaminophen  (TYLENOL ) 500 MG tablet, Take 1,000 mg by mouth every 6 (six) hours as needed for moderate pain., Disp: , Rfl:    acetaminophen  (TYLENOL ) 650 MG CR tablet, Take 1,300 mg by mouth every 8 (eight) hours as needed for pain., Disp: , Rfl:    amLODipine  (NORVASC ) 10 MG tablet, Take 10 mg by mouth in the morning., Disp: , Rfl:    Apoaequorin (PREVAGEN PO), Take 1 capsule by mouth daily., Disp: , Rfl:    carboxymethylcellulose (REFRESH PLUS) 0.5 % SOLN, Place 1 drop into both eyes daily as needed (dry/irritated eyes)., Disp: , Rfl:    cyanocobalamin  (VITAMIN B12) 1000 MCG tablet, Take 1,000 mcg by mouth daily., Disp: , Rfl:    fidaxomicin  (DIFICID ) 200 MG TABS tablet, Take 1 tablet (200 mg total) by mouth 2 (two) times daily., Disp: 20 tablet, Rfl: 0   gabapentin  (NEURONTIN ) 100 MG capsule, Take 1 capsule (100 mg total) by mouth 2 (two) times daily., Disp: 200 capsule, Rfl: 1   Homeopathic Products (THERAWORX RELIEF EX), Apply 1 Application topically daily as needed (pain)., Disp: , Rfl:    lipase/protease/amylase (CREON ) 36000 UNITS CPEP capsule, Take 36,000-72,000 Units by mouth See admin instructions. Take 72000 units with meals and 36000 units with snacks, Disp: , Rfl:    Menthol , Topical Analgesic, (BIOFREEZE EX), Apply 1 Application topically daily as needed (pain)., Disp: , Rfl:    metoprolol  succinate (TOPROL -XL) 50 MG 24 hr tablet, Take 50 mg by mouth in the morning., Disp: , Rfl:    mirtazapine  (REMERON ) 15 MG tablet, Take 1 tablet (15 mg total) by mouth at bedtime., Disp: 30 tablet, Rfl: 2   pantoprazole  (PROTONIX ) 40 MG tablet, Take 1 tablet (40 mg total) by mouth daily., Disp: 30 tablet, Rfl:  11   rosuvastatin  (CRESTOR ) 40 MG tablet, Take 1 tablet (40 mg total) by mouth at bedtime., Disp: 90 tablet, Rfl: 3   TURMERIC  PO, Take 1 capsule by mouth daily., Disp: , Rfl:   Consent:   NA  Disposition:   89-month follow-up sooner if needed  Her questions and concerns were addressed to her satisfaction. She voices understanding of the recommendations provided during this encounter.    Signed, Madonna Large, DO, [facility]  [facility]  [address] [place], [place] [address] 12/20/2023 1:19 PM

## 2023-12-24 ENCOUNTER — Ambulatory Visit (INDEPENDENT_AMBULATORY_CARE_PROVIDER_SITE_OTHER): Payer: Self-pay | Admitting: Psychiatry

## 2023-12-24 DIAGNOSIS — F331 Major depressive disorder, recurrent, moderate: Secondary | ICD-10-CM

## 2023-12-24 NOTE — Progress Notes (Signed)
Patient no showed for appt.

## 2023-12-26 ENCOUNTER — Ambulatory Visit: Payer: Medicare Other | Admitting: Psychiatry

## 2023-12-26 DIAGNOSIS — F331 Major depressive disorder, recurrent, moderate: Secondary | ICD-10-CM

## 2023-12-26 NOTE — Progress Notes (Signed)
[facility] Counselor/Therapist Progress Note  Patient ID: Stephanie Barber, MRN: [id number],    Date: 12/26/2023  Time Spent: 55 minutes   Treatment Type: Individual Therapy  Reported Symptoms:  anxiety,depression, sadness, feeling "morbid about other people and don't want them to die", worrying about my relatives  Mental Status Exam:  Appearance:   Neat and Well Groomed     Behavior:  Appropriate, Sharing, and Motivated  Motor:  Normal  Speech/Language:   Clear and Coherent  Affect:  Depressed and anxious  Mood:  anxious and depressed  Thought process:  goal directed  Thought content:    Some "worrying and ruminating"  Sensory/Perceptual disturbances:    WNL  Orientation:  oriented to person, place, time/date, situation, day of week, month of year, year, and stated date of Feb. 12, 2025  Attention:  Good  Concentration:  Good and Fair  Memory:  "Forgetful and have told my doctor about it, but I do pretty good in conversations with others"  Fund of knowledge:   Good and Fair  Insight:    Good and Fair  Judgment:   Good  Impulse Control:  Good   Risk Assessment: Danger to Self:  No Self-injurious Behavior: No Danger to Others: No Duty to Warn:no Physical Aggression / Violence:No  Access to Firearms a concern: No  Gang Involvement:No   Subjective:  Patient in session today showing good motivation and participation in session as she follows up from her initial evaluation recently.  She reports current symptoms to be depression, anxiety, and "feeling morbid and worried about other people, but not myself".  Reports that she stopped taking one of her meds , "not sure which one but I toldStates again in session today that what she is wanting to change is her fears and her thoughts to not be so negative and sad.  Patient lives with her spouse who is very supportive and also has 4 adult children (2 males and 2 females) living in [place], [place], and [address]  Patient  worries about her relatives and others dying.  She also reports she is no longer driving and her husband is able to drive her wherever she needs to go. Is 80 and pretty good health, married for She has lost some weight over a few months but that is leveled out and feels that she is at a good weight for her height currently.  She is in contact with her doctor working on "being more healthy".  Reports that her family is good to her and also has supportive friends, a sense of spirituality, a sense of hopefulness, is a good self advocate, and able to communicate effectively per patient.  It does seem in listening to her today and at her initial evaluation, that she is challenged in some of these areas she just mentioned and would like to improve in these areas and "feel like I did when I was feeling better". Talked freely and shared a lot of her history and other family details. Shared more about some of her depression and anxiety and we agreed on how we can move forward and help her develop more coping skills in managing those symptoms. Good contact with friends/family. Wants to focus more on her strength, feel less depressed, work on my goals, and feel less depressed and more hopeful overall. Denies any SI. Does recognize some of there strengths (family, friends, church, is a good Tax inspector, and working more on strengthening her "hopefulness".   Interventions:  Cognitive Behavioral Therapy and Ego-Supportive Elevate mood and show evidence of usual energy, activites, and socialization level.  Begin too experience sadness in session while discussing the disappointment related to the loss or pain from the past. Encourage sharing feelings of depression in order to clarify them and gain insight as to causes.   Diagnosis:   ICD-10-CM   1. Major depressive disorder, recurrent episode, moderate (HCC)  F33.1      Plan:  Patient today actively participating and showing good motivation in session working further  on her goals particularly relating to managing anxiety, stress, depression, and over-worrying about family and "lots of things I can't control." Also paid attention with patient to her positives including some time with friends, supportive husband and family, and a desire to feel better and be less worried. Patient has shown commitment and some progress and just getting started in therapy and being able to open up more and talk about sensitive issues.  Goal review and progress/challenges noted with patient.  Next appointment within 2 to 3 weeks.   Mathis Fare, LCSW

## 2023-12-27 ENCOUNTER — Ambulatory Visit (HOSPITAL_COMMUNITY)
Admission: RE | Admit: 2023-12-27 | Discharge: 2023-12-27 | Disposition: A | Discharge status: Home / Self Care | Payer: Self-pay | Source: Ambulatory Visit | Attending: Cardiology | Admitting: Cardiology

## 2023-12-27 DIAGNOSIS — R072 Precordial pain: Secondary | ICD-10-CM | POA: Insufficient documentation

## 2024-01-09 ENCOUNTER — Ambulatory Visit (INDEPENDENT_AMBULATORY_CARE_PROVIDER_SITE_OTHER): Payer: Medicare Other | Admitting: Psychiatry

## 2024-01-09 DIAGNOSIS — F331 Major depressive disorder, recurrent, moderate: Secondary | ICD-10-CM | POA: Diagnosis not present

## 2024-01-09 NOTE — Progress Notes (Signed)
 [facility] Counselor/Therapist Progress Note  Patient ID: Stephanie Barber, MRN: [id number],    Date: 01/09/2024  Time Spent: 50 minutes   Treatment Type: Individual Therapy  Reported Symptoms: anxiety, reporting and emphasizing some memory issues today, depression, sadness, feeling "morbid about other people not necessarily myself", worrying about my relatives; denies any SI   Mental Status Exam:  Appearance:   Neat     Behavior:  Appropriate, Sharing, and Motivated  Motor:  Uses rollator in walking  Speech/Language:   Clear and Coherent  Affect:  Depressed and anxious  Mood:  anxious and depressed  Thought process:  goal directed  Thought content:    WNL  Sensory/Perceptual disturbances:    WNL  Orientation:  oriented to person, place, time/date, situation, day of week, month of year, year, and stated date of Feb. 26, 2025  Attention:  Fair  Concentration:  Fair  Memory:  "Sometimes Fair and sometimes Poor"  Fund of knowledge:   Fair  Insight:    Good and Fair  Judgment:   Good  Impulse Control:  Good   Risk Assessment: Danger to Self:  No Self-injurious Behavior: No Danger to Others: No Duty to Warn:no Physical Aggression / Violence:No  Access to Firearms a concern: No  Gang Involvement:No   Subjective: Patient in today showing good motivation and actively participating in session regarding her anxiety, depression, feeling "morbid and worried about others" which she relates family/friend deaths.  She was more focused on how she feels she is having more memory issues and shared several examples of this in recent past and talked about this at length today.  Explored this more with patient and based on her information, I have talked with her and her husband and am encouraging her to follow through with her primary care doctor to inquire about a referral to a neurologist regarding her memory concerns.  Very pleasant lady but per her report is continuing to have more  memory issues, definitely more than she described an earlier session.  Wants to be more healthy overall but her priority right now is to have "my memory checked".  Husband supportive.  Will continue to work with patient here at our office on some of her depression and anxiety, and "continuing to be as healthy as possible".  Continues to appreciate supportive friends, a sense of hopefulness, her sense of spirituality, she is a good self advocate, and able to communicate effectively, although also shares some need areas that she would like to work further on in sessions which we agreed.  Focusing more on her strengths, decreasing her depression and anxiety, and "continue to enjoy my life as I did when I was feeling better".  Patient continues to deny any thoughts of self-harm and is able to see her strengths and not just her areas of need.  The strengths that she recognizes is having good family and friends, enjoys her church, is a good self advocate, and is "usually hopeful".  Did seem to feel good about checking in with her primary care doctor about some of the memory issues she is reporting  Interventions: Cognitive Behavioral Therapy and Ego-Supportive  Elevate mood and show evidence of usual energy, activites, and socialization level.  Begin too experience sadness in session while discussing the disappointment related to the loss or pain from the past. Encourage sharing feelings of depression in order to clarify them and gain insight as to causes.   Diagnosis:   ICD-10-CM  1. Major depressive disorder, recurrent episode, moderate (HCC)  F33.1      Plan: Patient today very neatly dressed and actively participating with good motivation in our session today as she worked further on her stress, anxiety, depression, some over worrying about family and things that she cannot control, and also was concerned about some memory issues that she is reporting.  She is following up by contacting her primary care  doctor and possibly a referral regarding an evaluation related to memory concerns.  Goal review and progress/challenges noted with patient.  Next appointment within 2 to 3 weeks.   Mathis Fare, LCSW

## 2024-01-10 ENCOUNTER — Ambulatory Visit (HOSPITAL_COMMUNITY): Payer: Medicare Other | Attending: Cardiology

## 2024-01-10 DIAGNOSIS — R072 Precordial pain: Secondary | ICD-10-CM | POA: Insufficient documentation

## 2024-01-10 LAB — ECHOCARDIOGRAM COMPLETE
Area-P 1/2: 1.76 cm2
Est EF: >75
S' Lateral: 1.5 cm

## 2024-01-10 MED ORDER — PERFLUTREN LIPID MICROSPHERE
1.0000 mL | INTRAVENOUS | Status: AC | PRN
  Administered 2024-01-10: 2 mL via INTRAVENOUS

## 2024-01-11 ENCOUNTER — Encounter: Payer: Self-pay | Admitting: Plastic Surgery

## 2024-01-11 ENCOUNTER — Ambulatory Visit (INDEPENDENT_AMBULATORY_CARE_PROVIDER_SITE_OTHER): Payer: Medicare Other | Admitting: Plastic Surgery

## 2024-01-11 VITALS — BP 114/73 | HR 63 | Ht 59.0 in | Wt 111.8 lb

## 2024-01-11 DIAGNOSIS — S01312A Laceration without foreign body of left ear, initial encounter: Secondary | ICD-10-CM | POA: Diagnosis not present

## 2024-01-11 DIAGNOSIS — S01311A Laceration without foreign body of right ear, initial encounter: Secondary | ICD-10-CM

## 2024-01-11 DIAGNOSIS — S01319A Laceration without foreign body of unspecified ear, initial encounter: Secondary | ICD-10-CM | POA: Insufficient documentation

## 2024-01-11 NOTE — Progress Notes (Signed)
 Patient ID: Stephanie Barber, female    DOB: 1941-08-09, 83 y.o.   MRN: [id number]   Chief Complaint  Patient presents with   Consult         The patient is an 83 year old female here for evaluation of her earlobes.  The patient has worn earlobes for many years and states she probably had them too heavy.  She has a splint on the right and the left earlobe.  She has a second piercing in each lobe as well.  Pictures are in the chart.  No sign of infection.  She would like to have them repaired.    Review of Systems  Constitutional: Negative.   Eyes: Negative.   Respiratory: Negative.    Cardiovascular: Negative.   Gastrointestinal: Negative.   Endocrine: Negative.   Genitourinary: Negative.   Musculoskeletal: Negative.     Past Medical History:  Diagnosis Date   Arthritis    Chronic headache    Complication of anesthesia    allergy to Novocaine    Diabetes mellitus    GERD (gastroesophageal reflux disease)    GI bleed    Heart murmur    High cholesterol    Hyperlipidemia    Hypertension    Intracranial atherosclerosis    per MRA   Migraine    Narcolepsy and cataplexy    OSA    states has not used CPAP in over a year   Peripheral vascular disease (HCC)    Rectal prolapse    Stage 3b chronic kidney disease (CKD) (HCC)    Trigger finger of all digits of right hand     Past Surgical History:  Procedure Laterality Date   ABDOMINAL HYSTERECTOMY     Fibroids   BACK SURGERY     L4,L5 discectomy   BOTOX INJECTION N/A 01/02/2023   Procedure: BOTOX INJECTION;  Surgeon: Noel Christmas, MD;  Location: [facility];  Service: Urology;  Laterality: N/A;   CARDIAC CATHETERIZATION     CARPAL TUNNEL RELEASE Right 01/18/2017   COLONOSCOPY N/A 07/28/2022   Procedure: COLONOSCOPY;  Surgeon: Vida Rigger, MD;  Location: [facility];  Service: Gastroenterology;  Laterality: N/A;   COLONOSCOPY WITH PROPOFOL N/A 06/17/2021   Procedure: COLONOSCOPY WITH PROPOFOL;  Surgeon: Charlott Rakes, MD;  Location: [facility];  Service: Endoscopy;  Laterality: N/A;   CYSTOSCOPY WITH INJECTION N/A 01/02/2023   Procedure: CYSTOSCOPY WITH Mariam Dollar;  Surgeon: Noel Christmas, MD;  Location: [facility];  Service: Urology;  Laterality: N/A;  45 MINS   ESOPHAGOGASTRODUODENOSCOPY (EGD) WITH PROPOFOL N/A 06/17/2021   Procedure: ESOPHAGOGASTRODUODENOSCOPY (EGD) WITH PROPOFOL;  Surgeon: Charlott Rakes, MD;  Location: [facility];  Service: Endoscopy;  Laterality: N/A;   KIDNEY SURGERY  1998   Right.  growth removed   POLYPECTOMY  06/17/2021   Procedure: POLYPECTOMY;  Surgeon: Charlott Rakes, MD;  Location: [facility];  Service: Endoscopy;;   POLYPECTOMY  07/28/2022   Procedure: POLYPECTOMY;  Surgeon: Vida Rigger, MD;  Location: [facility];  Service: Gastroenterology;;   rectal prolapse repair  12/05/2011   ROTATOR CUFF REPAIR     Left   SHOULDER ARTHROSCOPY WITH SUBACROMIAL DECOMPRESSION, ROTATOR CUFF REPAIR AND BICEP TENDON REPAIR Left 05/25/2015   Procedure: SHOULDER ARTHROSCOPY WITH SUBACROMIAL DECOMPRESSION, ROTATOR CUFF REPAIR AND BICEP TENODESIS, DEBRIDEMENT. ;  Surgeon: Cammy Copa, MD;  Location: [facility];  Service: Orthopedics;  Laterality: Left;  LEFT SHOULDER ROTATOR CUFF TEAR REPAIR, ARTHROSCOPY, DEBRIDEMENT, BICEPS TENODESIS, SUBACROMIAL DECOMPRESSION.   TOTAL  ABDOMINAL HYSTERECTOMY     TRIGGER FINGER RELEASE Right 04/30/2018   Procedure: RELEASE TRIGGER FINGER/A-1 PULLEY RIGHT MIDDLE  AND SMALLn ;  Surgeon: Cindee Salt, MD;  Location: [facility];  Service: Orthopedics;  Laterality: Right;  FAB, Bier block   TUBAL LIGATION        Current Outpatient Medications:    acetaminophen (TYLENOL) 500 MG tablet, Take 1,000 mg by mouth every 6 (six) hours as needed for moderate pain., Disp: , Rfl:    acetaminophen (TYLENOL) 650 MG CR tablet, Take 1,300 mg by mouth every 8 (eight) hours as needed for pain., Disp: , Rfl:    amLODipine (NORVASC) 10 MG tablet,  Take 10 mg by mouth in the morning., Disp: , Rfl:    Apoaequorin (PREVAGEN PO), Take 1 capsule by mouth daily., Disp: , Rfl:    carboxymethylcellulose (REFRESH PLUS) 0.5 % SOLN, Place 1 drop into both eyes daily as needed (dry/irritated eyes)., Disp: , Rfl:    cyanocobalamin (VITAMIN B12) 1000 MCG tablet, Take 1,000 mcg by mouth daily., Disp: , Rfl:    fidaxomicin (DIFICID) 200 MG TABS tablet, Take 1 tablet (200 mg total) by mouth 2 (two) times daily., Disp: 20 tablet, Rfl: 0   gabapentin (NEURONTIN) 100 MG capsule, Take 1 capsule (100 mg total) by mouth 2 (two) times daily., Disp: 200 capsule, Rfl: 1   Homeopathic Products (THERAWORX RELIEF EX), Apply 1 Application topically daily as needed (pain)., Disp: , Rfl:    lipase/protease/amylase (CREON) 36000 UNITS CPEP capsule, Take 36,000-72,000 Units by mouth See admin instructions. Take 16109 units with meals and 36000 units with snacks, Disp: , Rfl:    Menthol, Topical Analgesic, (BIOFREEZE EX), Apply 1 Application topically daily as needed (pain)., Disp: , Rfl:    metoprolol succinate (TOPROL-XL) 50 MG 24 hr tablet, Take 50 mg by mouth in the morning., Disp: , Rfl:    mirtazapine (REMERON) 15 MG tablet, Take 1 tablet (15 mg total) by mouth at bedtime., Disp: 30 tablet, Rfl: 2   rosuvastatin (CRESTOR) 40 MG tablet, Take 1 tablet (40 mg total) by mouth at bedtime., Disp: 90 tablet, Rfl: 3   TURMERIC PO, Take 1 capsule by mouth daily., Disp: , Rfl:    pantoprazole (PROTONIX) 40 MG tablet, Take 1 tablet (40 mg total) by mouth daily., Disp: 30 tablet, Rfl: 11   Objective:   Vitals:   01/11/24 1022  BP: 114/73  Pulse: 63  SpO2: 100%    Physical Exam HENT:     Ears:   Cardiovascular:     Rate and Rhythm: Normal rate.     Pulses: Normal pulses.  Pulmonary:     Effort: Pulmonary effort is normal.  Skin:    Capillary Refill: Capillary refill takes less than 2 seconds.  Neurological:     Mental Status: She is oriented to person, place, and  time.  Psychiatric:        Mood and Affect: Mood normal.        Behavior: Behavior normal.        Thought Content: Thought content normal.        Judgment: Judgment normal.     Assessment & Plan:  Tear of earlobe, unspecified laterality, initial encounter  Plan for earlobe repair bilaterally in the office.  Pictures were obtained of the patient and placed in the chart with the patient's or guardian's permission.   Alena Bills Leonid Manus, DO

## 2024-01-17 ENCOUNTER — Ambulatory Visit
Admission: RE | Admit: 2024-01-17 | Discharge: 2024-01-17 | Disposition: A | Discharge status: Home / Self Care | Payer: Medicare Other | Source: Ambulatory Visit | Attending: Family Medicine | Admitting: Family Medicine

## 2024-01-17 DIAGNOSIS — M8588 Other specified disorders of bone density and structure, other site: Secondary | ICD-10-CM | POA: Diagnosis not present

## 2024-01-17 DIAGNOSIS — E2839 Other primary ovarian failure: Secondary | ICD-10-CM

## 2024-01-22 ENCOUNTER — Ambulatory Visit: Payer: Medicare Other | Admitting: Psychiatry

## 2024-01-25 DIAGNOSIS — N1832 Chronic kidney disease, stage 3b: Secondary | ICD-10-CM | POA: Diagnosis not present

## 2024-01-25 DIAGNOSIS — Z8719 Personal history of other diseases of the digestive system: Secondary | ICD-10-CM | POA: Diagnosis not present

## 2024-01-25 DIAGNOSIS — I129 Hypertensive chronic kidney disease with stage 1 through stage 4 chronic kidney disease, or unspecified chronic kidney disease: Secondary | ICD-10-CM | POA: Diagnosis not present

## 2024-01-25 DIAGNOSIS — R197 Diarrhea, unspecified: Secondary | ICD-10-CM | POA: Diagnosis not present

## 2024-01-29 DIAGNOSIS — R194 Change in bowel habit: Secondary | ICD-10-CM | POA: Diagnosis not present

## 2024-01-29 DIAGNOSIS — R1084 Generalized abdominal pain: Secondary | ICD-10-CM | POA: Diagnosis not present

## 2024-01-29 DIAGNOSIS — R197 Diarrhea, unspecified: Secondary | ICD-10-CM | POA: Diagnosis not present

## 2024-01-30 ENCOUNTER — Other Ambulatory Visit: Payer: Self-pay

## 2024-01-30 DIAGNOSIS — R072 Precordial pain: Secondary | ICD-10-CM

## 2024-01-30 DIAGNOSIS — I2510 Atherosclerotic heart disease of native coronary artery without angina pectoris: Secondary | ICD-10-CM

## 2024-01-30 DIAGNOSIS — E119 Type 2 diabetes mellitus without complications: Secondary | ICD-10-CM

## 2024-02-01 ENCOUNTER — Telehealth (HOSPITAL_COMMUNITY): Payer: Self-pay | Admitting: *Deleted

## 2024-02-01 NOTE — Telephone Encounter (Signed)
 Left detailed instructions for MPI study.

## 2024-02-04 DIAGNOSIS — E1122 Type 2 diabetes mellitus with diabetic chronic kidney disease: Secondary | ICD-10-CM | POA: Diagnosis not present

## 2024-02-04 DIAGNOSIS — G47411 Narcolepsy with cataplexy: Secondary | ICD-10-CM | POA: Diagnosis not present

## 2024-02-04 DIAGNOSIS — I129 Hypertensive chronic kidney disease with stage 1 through stage 4 chronic kidney disease, or unspecified chronic kidney disease: Secondary | ICD-10-CM | POA: Diagnosis not present

## 2024-02-04 DIAGNOSIS — N1832 Chronic kidney disease, stage 3b: Secondary | ICD-10-CM | POA: Diagnosis not present

## 2024-02-04 DIAGNOSIS — E785 Hyperlipidemia, unspecified: Secondary | ICD-10-CM | POA: Diagnosis not present

## 2024-02-05 ENCOUNTER — Ambulatory Visit (HOSPITAL_COMMUNITY): Attending: Cardiology

## 2024-02-05 DIAGNOSIS — R072 Precordial pain: Secondary | ICD-10-CM | POA: Insufficient documentation

## 2024-02-05 LAB — MYOCARDIAL PERFUSION IMAGING
Base ST Depression (mm): 0 mm
LV dias vol: 29 mL (ref 46–106)
LV sys vol: 5 mL
Nuc Stress EF: 82 %
Peak HR: 96 {beats}/min
Rest HR: 58 {beats}/min
Rest Nuclear Isotope Dose: 10.2 mCi
SDS: 5
SRS: 0
SSS: 5
ST Depression (mm): 0 mm
Stress Nuclear Isotope Dose: 32.8 mCi
TID: 0.82

## 2024-02-05 MED ORDER — REGADENOSON 0.4 MG/5ML IV SOLN
0.4000 mg | Freq: Once | INTRAVENOUS | Status: AC
  Administered 2024-02-05: 0.4 mg via INTRAVENOUS

## 2024-02-05 MED ORDER — TECHNETIUM TC 99M TETROFOSMIN IV KIT
32.8000 | PACK | Freq: Once | INTRAVENOUS | Status: AC | PRN
  Administered 2024-02-05: 32.8 via INTRAVENOUS

## 2024-02-05 MED ORDER — TECHNETIUM TC 99M TETROFOSMIN IV KIT
10.2000 | PACK | Freq: Once | INTRAVENOUS | Status: AC | PRN
  Administered 2024-02-05: 10.2 via INTRAVENOUS

## 2024-02-06 ENCOUNTER — Encounter: Payer: Self-pay | Admitting: Cardiology

## 2024-02-06 ENCOUNTER — Ambulatory Visit (INDEPENDENT_AMBULATORY_CARE_PROVIDER_SITE_OTHER): Payer: Medicare Other | Admitting: Psychiatry

## 2024-02-06 DIAGNOSIS — F331 Major depressive disorder, recurrent, moderate: Secondary | ICD-10-CM | POA: Diagnosis not present

## 2024-02-06 NOTE — Progress Notes (Signed)
 [facility] Counselor/Therapist Progress Note  Patient ID: Stephanie Barber, MRN: [id number],    Date: 02/06/2024  Time Spent: 53 minutes  Treatment Type: Individual Therapy  Reported Symptoms: anxiety, reports and emphasizes some memory issues, less appetite, depression, sadness, "fears that others might die, not me", denies any SI, reporting more "forgetfulness"   Mental Status Exam:  Appearance:   Neat and Well Groomed     Behavior:  Appropriate, Sharing, and Motivated  Motor:  Uses rollator in walking "because I'm not as stable"  Speech/Language:   Normal Rate  Affect:  anxious  Mood:  anxious  Thought process:  goal directed  Thought content:    Rumination  Sensory/Perceptual disturbances:    WNL  Orientation:  oriented to person, place, situation, day of week, month of year, and year, missed date  Attention:  Fair  Concentration:  Fair  Memory:  Forgets "dates, sometimes the month, names of familiar people"  Fund of knowledge:   Fair  Insight:    Fair  Judgment:   Good and Fair  Impulse Control:  Good   Risk Assessment: Danger to Self:  No Self-injurious Behavior: No Danger to Others: No Duty to Warn:no Physical Aggression / Violence:No  Access to Firearms a concern: No  Gang Involvement:No   Subjective: Patient in today working further on her anxiety, depression, with depression being the stronger symptom and denies any thoughts to harm self nor others. Today is in good mood, neatly dressed, and pleasant in conversation. Stated "I haven't been around a lot of people lately ", adding that there's no particular reason for that. Is celebrating her 61st wedding anniversary with her husband this week.  Difficulty staying on track and remembering words in conversation today. Concerned about her memory issues and states "I don't like being at this stage." Does report continued memory issues and agrees that she needs to be evaluated for this as I talked further with  her and listened as she responded. States she has talked with husband "but I don't think he has scheduled an appointment for me to see Dr re: my memory." He does bring patient to her appts here and we spoke together before they left our office.  Husband is following up with primary care doctor about a referral to neurologist.  Husband and patient agreed that they will follow-up with our office for appointment with therapist after this referral has been made.  Patient in good spirits today although some noticeable progression in her memory issues.   Interventions: Cognitive Behavioral Therapy and Solution-Oriented/Positive Psychology Elevate mood and show evidence of usual energy, activites, and socialization level.  Begin too experience sadness in session while discussing the disappointment related to the loss or pain from the past. Encourage sharing feelings of depression in order to clarify them and gain insight as to causes.   Diagnosis:   ICD-10-CM   1. Major depressive disorder, recurrent episode, moderate (HCC)  F33.1      Plan: Patient and today with good motivation and participation in session as she talked further about her stress, anxiety, depression, and some worries within the family.  She mentioned her memory issues and that is becoming a bigger concern for her as she notices it is getting some worse, and this therapist noticed that her memory issues do seem to have increased in a short period of time.  At end of my session with patient, with her permission I spoke with her and her husband spoke  about the noticeable progression of her memory issues and they are contacting their primary care physician about patient's worsening memory and a possible referral to her neurologist.  Goal review and progress/challenges noted with patient.  Next appointment within 3 weeks.   Mathis Fare, LCSW

## 2024-02-11 DIAGNOSIS — G3184 Mild cognitive impairment, so stated: Secondary | ICD-10-CM | POA: Diagnosis not present

## 2024-02-11 DIAGNOSIS — R197 Diarrhea, unspecified: Secondary | ICD-10-CM | POA: Diagnosis not present

## 2024-02-26 ENCOUNTER — Ambulatory Visit (INDEPENDENT_AMBULATORY_CARE_PROVIDER_SITE_OTHER): Payer: Medicare Other | Admitting: Adult Health

## 2024-02-26 DIAGNOSIS — Z0389 Encounter for observation for other suspected diseases and conditions ruled out: Secondary | ICD-10-CM

## 2024-02-26 NOTE — Progress Notes (Signed)
 Patient no show appointment. ? ?

## 2024-04-05 ENCOUNTER — Emergency Department (HOSPITAL_COMMUNITY)

## 2024-04-05 ENCOUNTER — Inpatient Hospital Stay (HOSPITAL_COMMUNITY)
Admission: EM | Admit: 2024-04-05 | Discharge: 2024-04-08 | DRG: 270 | Disposition: A | Discharge status: Home / Self Care | Attending: Internal Medicine | Admitting: Internal Medicine

## 2024-04-05 DIAGNOSIS — K922 Gastrointestinal hemorrhage, unspecified: Secondary | ICD-10-CM | POA: Diagnosis present

## 2024-04-05 DIAGNOSIS — I10 Essential (primary) hypertension: Secondary | ICD-10-CM | POA: Diagnosis present

## 2024-04-05 DIAGNOSIS — Z808 Family history of malignant neoplasm of other organs or systems: Secondary | ICD-10-CM | POA: Diagnosis not present

## 2024-04-05 DIAGNOSIS — I129 Hypertensive chronic kidney disease with stage 1 through stage 4 chronic kidney disease, or unspecified chronic kidney disease: Secondary | ICD-10-CM | POA: Diagnosis not present

## 2024-04-05 DIAGNOSIS — N1832 Chronic kidney disease, stage 3b: Secondary | ICD-10-CM | POA: Diagnosis not present

## 2024-04-05 DIAGNOSIS — K219 Gastro-esophageal reflux disease without esophagitis: Secondary | ICD-10-CM | POA: Diagnosis present

## 2024-04-05 DIAGNOSIS — R42 Dizziness and giddiness: Secondary | ICD-10-CM | POA: Diagnosis not present

## 2024-04-05 DIAGNOSIS — E739 Lactose intolerance, unspecified: Secondary | ICD-10-CM | POA: Diagnosis not present

## 2024-04-05 DIAGNOSIS — E1151 Type 2 diabetes mellitus with diabetic peripheral angiopathy without gangrene: Secondary | ICD-10-CM | POA: Diagnosis present

## 2024-04-05 DIAGNOSIS — E875 Hyperkalemia: Secondary | ICD-10-CM | POA: Diagnosis not present

## 2024-04-05 DIAGNOSIS — K5731 Diverticulosis of large intestine without perforation or abscess with bleeding: Secondary | ICD-10-CM | POA: Diagnosis not present

## 2024-04-05 DIAGNOSIS — K625 Hemorrhage of anus and rectum: Principal | ICD-10-CM

## 2024-04-05 DIAGNOSIS — Z8051 Family history of malignant neoplasm of kidney: Secondary | ICD-10-CM

## 2024-04-05 DIAGNOSIS — Z885 Allergy status to narcotic agent status: Secondary | ICD-10-CM

## 2024-04-05 DIAGNOSIS — E872 Acidosis, unspecified: Secondary | ICD-10-CM | POA: Diagnosis not present

## 2024-04-05 DIAGNOSIS — N200 Calculus of kidney: Secondary | ICD-10-CM | POA: Diagnosis not present

## 2024-04-05 DIAGNOSIS — Z91199 Patient's noncompliance with other medical treatment and regimen due to unspecified reason: Secondary | ICD-10-CM

## 2024-04-05 DIAGNOSIS — G4733 Obstructive sleep apnea (adult) (pediatric): Secondary | ICD-10-CM | POA: Diagnosis present

## 2024-04-05 DIAGNOSIS — D62 Acute posthemorrhagic anemia: Secondary | ICD-10-CM | POA: Diagnosis present

## 2024-04-05 DIAGNOSIS — Z79899 Other long term (current) drug therapy: Secondary | ICD-10-CM | POA: Diagnosis not present

## 2024-04-05 DIAGNOSIS — F419 Anxiety disorder, unspecified: Secondary | ICD-10-CM | POA: Diagnosis present

## 2024-04-05 DIAGNOSIS — K5750 Diverticulosis of both small and large intestine without perforation or abscess without bleeding: Secondary | ICD-10-CM | POA: Diagnosis not present

## 2024-04-05 DIAGNOSIS — D649 Anemia, unspecified: Secondary | ICD-10-CM | POA: Diagnosis not present

## 2024-04-05 DIAGNOSIS — Z7984 Long term (current) use of oral hypoglycemic drugs: Secondary | ICD-10-CM | POA: Diagnosis not present

## 2024-04-05 DIAGNOSIS — Z8249 Family history of ischemic heart disease and other diseases of the circulatory system: Secondary | ICD-10-CM

## 2024-04-05 DIAGNOSIS — K5730 Diverticulosis of large intestine without perforation or abscess without bleeding: Secondary | ICD-10-CM | POA: Diagnosis not present

## 2024-04-05 DIAGNOSIS — Z87891 Personal history of nicotine dependence: Secondary | ICD-10-CM

## 2024-04-05 DIAGNOSIS — N281 Cyst of kidney, acquired: Secondary | ICD-10-CM | POA: Diagnosis not present

## 2024-04-05 DIAGNOSIS — E1122 Type 2 diabetes mellitus with diabetic chronic kidney disease: Secondary | ICD-10-CM | POA: Diagnosis not present

## 2024-04-05 DIAGNOSIS — K559 Vascular disorder of intestine, unspecified: Secondary | ICD-10-CM | POA: Diagnosis not present

## 2024-04-05 DIAGNOSIS — Z90710 Acquired absence of both cervix and uterus: Secondary | ICD-10-CM | POA: Diagnosis not present

## 2024-04-05 DIAGNOSIS — E7800 Pure hypercholesterolemia, unspecified: Secondary | ICD-10-CM | POA: Diagnosis present

## 2024-04-05 DIAGNOSIS — K921 Melena: Secondary | ICD-10-CM | POA: Diagnosis not present

## 2024-04-05 DIAGNOSIS — R932 Abnormal findings on diagnostic imaging of liver and biliary tract: Secondary | ICD-10-CM | POA: Diagnosis not present

## 2024-04-05 DIAGNOSIS — I728 Aneurysm of other specified arteries: Secondary | ICD-10-CM | POA: Diagnosis not present

## 2024-04-05 DIAGNOSIS — R109 Unspecified abdominal pain: Secondary | ICD-10-CM | POA: Diagnosis not present

## 2024-04-05 DIAGNOSIS — K769 Liver disease, unspecified: Secondary | ICD-10-CM | POA: Diagnosis not present

## 2024-04-05 LAB — COMPREHENSIVE METABOLIC PANEL WITH GFR
ALT: 23 U/L (ref 0–44)
AST: 38 U/L (ref 15–41)
Albumin: 4 g/dL (ref 3.5–5.0)
Alkaline Phosphatase: 51 U/L (ref 38–126)
Anion gap: 7 (ref 5–15)
BUN: 31 mg/dL — ABNORMAL HIGH (ref 8–23)
CO2: 22 mmol/L (ref 22–32)
Calcium: 9.6 mg/dL (ref 8.9–10.3)
Chloride: 110 mmol/L (ref 98–111)
Creatinine, Ser: 1.63 mg/dL — ABNORMAL HIGH (ref 0.44–1.00)
GFR, Estimated: 31 mL/min — ABNORMAL LOW (ref >60)
Glucose, Bld: 88 mg/dL (ref 70–99)
Potassium: 5.5 mmol/L — ABNORMAL HIGH (ref 3.5–5.1)
Sodium: 139 mmol/L (ref 135–145)
Total Bilirubin: 1 mg/dL (ref 0.0–1.2)
Total Protein: 7.6 g/dL (ref 6.5–8.1)

## 2024-04-05 LAB — CBC WITH DIFFERENTIAL/PLATELET
Abs Immature Granulocytes: 0 10*3/uL (ref 0.00–0.07)
Basophils Absolute: 0 10*3/uL (ref 0.0–0.1)
Basophils Relative: 1 %
Eosinophils Absolute: 0.1 10*3/uL (ref 0.0–0.5)
Eosinophils Relative: 4 %
HCT: 32.9 % — ABNORMAL LOW (ref 36.0–46.0)
Hemoglobin: 10.5 g/dL — ABNORMAL LOW (ref 12.0–15.0)
Immature Granulocytes: 0 %
Lymphocytes Relative: 29 %
Lymphs Abs: 1.1 10*3/uL (ref 0.7–4.0)
MCH: 30.8 pg (ref 26.0–34.0)
MCHC: 31.9 g/dL (ref 30.0–36.0)
MCV: 96.5 fL (ref 80.0–100.0)
Monocytes Absolute: 0.4 10*3/uL (ref 0.1–1.0)
Monocytes Relative: 11 %
Neutro Abs: 2.2 10*3/uL (ref 1.7–7.7)
Neutrophils Relative %: 55 %
Platelets: 215 10*3/uL (ref 150–400)
RBC: 3.41 MIL/uL — ABNORMAL LOW (ref 3.87–5.11)
RDW: 12.5 % (ref 11.5–15.5)
WBC: 4 10*3/uL (ref 4.0–10.5)
nRBC: 0 % (ref 0.0–0.2)

## 2024-04-05 LAB — PREPARE RBC (CROSSMATCH)

## 2024-04-05 LAB — CBC
HCT: 26.9 % — ABNORMAL LOW (ref 36.0–46.0)
Hemoglobin: 8.4 g/dL — ABNORMAL LOW (ref 12.0–15.0)
MCH: 30.5 pg (ref 26.0–34.0)
MCHC: 31.2 g/dL (ref 30.0–36.0)
MCV: 97.8 fL (ref 80.0–100.0)
Platelets: 193 10*3/uL (ref 150–400)
RBC: 2.75 MIL/uL — ABNORMAL LOW (ref 3.87–5.11)
RDW: 12.4 % (ref 11.5–15.5)
WBC: 6.4 10*3/uL (ref 4.0–10.5)
nRBC: 0 % (ref 0.0–0.2)

## 2024-04-05 LAB — CBG MONITORING, ED: Glucose-Capillary: 105 mg/dL — ABNORMAL HIGH (ref 70–99)

## 2024-04-05 LAB — LIPASE, BLOOD: Lipase: 49 U/L (ref 11–51)

## 2024-04-05 MED ORDER — PANTOPRAZOLE SODIUM 40 MG IV SOLR
40.0000 mg | Freq: Once | INTRAVENOUS | Status: AC
  Administered 2024-04-05: 40 mg via INTRAVENOUS
  Filled 2024-04-05: qty 10

## 2024-04-05 MED ORDER — SODIUM CHLORIDE 0.9 % IV BOLUS
1000.0000 mL | Freq: Once | INTRAVENOUS | Status: AC
  Administered 2024-04-05: 1000 mL via INTRAVENOUS

## 2024-04-05 MED ORDER — IOHEXOL 350 MG/ML SOLN
75.0000 mL | Freq: Once | INTRAVENOUS | Status: AC | PRN
  Administered 2024-04-05: 75 mL via INTRAVENOUS

## 2024-04-05 MED ORDER — SODIUM CHLORIDE 0.9% IV SOLUTION: Freq: Once | INTRAVENOUS | Status: AC

## 2024-04-05 NOTE — ED Notes (Signed)
 To Ct

## 2024-04-05 NOTE — ED Provider Notes (Cosign Needed Addendum)
 [place] EMERGENCY DEPARTMENT AT [facility] Provider Note   CSN: [id number] Arrival date & time: 04/05/24  1517     History Chief Complaint  Patient presents with   GI Bleeding    Stephanie Barber is a 83 y.o. female patient with with history of GI bleed, hypertension, hyperlipidemia, diabetes mellitus, reflux who presents to the emergency room today for further evaluation of rectal bleeding which started around 11 AM.  Patient reports grossly maroon-colored stools.  She has had approximately 5 bloody bowel movements today.  She states that last time this happened she had to receive blood.  She denies any abdominal pain, nausea, vomiting, fever, chills.  She does endorse associated generalized weakness and fatigue.  No shortness of breath or chest pain.  Patient was seen evaluated for similar symptoms back in November 2023.  He was thought to be due to a diverticular bleed.  HPI     Home Medications Prior to Admission medications   Medication Sig Start Date End Date Taking? Authorizing Provider  acetaminophen  (TYLENOL ) 500 MG tablet Take 1,000 mg by mouth every 6 (six) hours as needed for moderate pain.    [provider]  acetaminophen  (TYLENOL ) 650 MG CR tablet Take 1,300 mg by mouth every 8 (eight) hours as needed for pain.    [provider]  amLODipine  (NORVASC ) 10 MG tablet Take 10 mg by mouth in the morning.    [provider]  Apoaequorin (PREVAGEN PO) Take 1 capsule by mouth daily.    [provider]  carboxymethylcellulose (REFRESH PLUS) 0.5 % SOLN Place 1 drop into both eyes daily as needed (dry/irritated eyes).    [provider]  cyanocobalamin  (VITAMIN B12) 1000 MCG tablet Take 1,000 mcg by mouth daily.    [provider]  fidaxomicin  (DIFICID ) 200 MG TABS tablet Take 1 tablet (200 mg total) by mouth 2 (two) times daily. 10/15/23   Debbra Fairy, PA-C  gabapentin  (NEURONTIN ) 100 MG capsule Take 1 capsule  (100 mg total) by mouth 2 (two) times daily. 08/25/23   Jasmine Mesi, MD  Homeopathic Products [facility] RELIEF EX) Apply 1 Application topically daily as needed (pain).    [provider]  lipase/protease/amylase (CREON ) 36000 UNITS CPEP capsule Take 36,000-72,000 Units by mouth See admin instructions. Take 72000 units with meals and 36000 units with snacks    [provider]  Menthol, Topical Analgesic, (BIOFREEZE EX) Apply 1 Application topically daily as needed (pain).    [provider]  metoprolol  succinate (TOPROL -XL) 50 MG 24 hr tablet Take 50 mg by mouth in the morning.    [provider]  mirtazapine  (REMERON ) 15 MG tablet Take 1 tablet (15 mg total) by mouth at bedtime. 11/28/23   Mozingo, Regina Nattalie, NP  pantoprazole  (PROTONIX ) 40 MG tablet Take 1 tablet (40 mg total) by mouth daily. 07/29/22 12/20/23  Vada Garibaldi, MD  rosuvastatin  (CRESTOR ) 40 MG tablet Take 1 tablet (40 mg total) by mouth at bedtime. 12/20/23   Tolia, Sunit, DO  TURMERIC PO Take 1 capsule by mouth daily.    [provider]      Allergies    Morphine and codeine and Lactose intolerance (gi)    Review of Systems   Review of Systems  All other systems reviewed and are negative.   Physical Exam Updated Vital Signs BP (!) 163/63   Pulse 60   Temp 97.6 F (36.4 C) (Oral)   Resp 14   SpO2  100%  Physical Exam Vitals and nursing note reviewed.  Constitutional:      General: She is not in acute distress.    Appearance: Normal appearance.  HENT:     Head: Normocephalic and atraumatic.  Eyes:     General:        Right eye: No discharge.        Left eye: No discharge.  Cardiovascular:     Comments: Regular rate and rhythm.  S1/S2 are distinct without any evidence of murmur, rubs, or gallops.  Radial pulses are 2+ bilaterally.  Dorsalis pedis pulses are 2+ bilaterally.  No evidence of pedal edema. Pulmonary:     Comments: Clear to auscultation  bilaterally.  Normal effort.  No respiratory distress.  No evidence of wheezes, rales, or rhonchi heard throughout. Abdominal:     General: Abdomen is flat. Bowel sounds are normal. There is no distension.     Tenderness: There is no abdominal tenderness. There is no guarding or rebound.  Musculoskeletal:        General: Normal range of motion.     Cervical back: Neck supple.  Skin:    General: Skin is warm and dry.     Findings: No rash.  Neurological:     General: No focal deficit present.     Mental Status: She is alert.  Psychiatric:        Mood and Affect: Mood normal.        Behavior: Behavior normal.     ED Results / Procedures / Treatments   Labs (all labs ordered are listed, but only abnormal results are displayed) Labs Reviewed  CBC WITH DIFFERENTIAL/PLATELET - Abnormal; Notable for the following components:      Result Value   RBC 3.41 (*)    Hemoglobin 10.5 (*)    HCT 32.9 (*)    All other components within normal limits  COMPREHENSIVE METABOLIC PANEL WITH GFR - Abnormal; Notable for the following components:   Potassium 5.5 (*)    BUN 31 (*)    Creatinine, Ser 1.63 (*)    GFR, Estimated 31 (*)    All other components within normal limits  CBC - Abnormal; Notable for the following components:   RBC 2.75 (*)    Hemoglobin 8.4 (*)    HCT 26.9 (*)    All other components within normal limits  CBG MONITORING, ED - Abnormal; Notable for the following components:   Glucose-Capillary 105 (*)    All other components within normal limits  LIPASE, BLOOD  URINALYSIS, ROUTINE W REFLEX MICROSCOPIC  TYPE AND SCREEN  PREPARE RBC (CROSSMATCH)    EKG None  Radiology CT ANGIO GI BLEED Result Date: 04/05/2024 CLINICAL DATA:  Lower GI bleed, bright red blood EXAM: CTA ABDOMEN AND PELVIS WITHOUT AND WITH CONTRAST TECHNIQUE: Multidetector CT imaging of the abdomen and pelvis was performed using the standard protocol during bolus administration of intravenous contrast.  Multiplanar reconstructed images and MIPs were obtained and reviewed to evaluate the vascular anatomy. RADIATION DOSE REDUCTION: This exam was performed according to the departmental dose-optimization program which includes automated exposure control, adjustment of the mA and/or kV according to patient size and/or use of iterative reconstruction technique. CONTRAST:  75mL OMNIPAQUE  IOHEXOL  350 MG/ML SOLN COMPARISON:  CT abdomen pelvis earlier today FINDINGS: VASCULAR Calcified atherosclerotic plaque in the aorta and its mesenteric, renal, and iliac artery branches without hemodynamically significant stenosis, aneurysm, or dissection. Review of the MIP images confirms the above findings. NON-VASCULAR Lower  chest: No acute abnormality. Hepatobiliary: No acute abnormality. Hypoattenuating lesions in the left hepatic lobe are too small to definitively characterize but likely represent benign cysts. Pancreas: Unremarkable. Spleen: Unremarkable. Adrenals/Urinary Tract: Normal adrenal glands. Nonobstructing by rough lateral nephrolithiasis. No urinary calculi or hydronephrosis. Unremarkable bladder. Stomach/Bowel: Stomach is within normal limits. No bowel obstruction. Colonic diverticulosis without evidence of diverticulitis. Question mild wall thickening and mucosal hyperenhancement of the ascending, transverse, and descending colon. No adjacent inflammatory stranding or fluid. No evidence of active GI bleeding. Postoperative change about the sigmoid colon. Normal appendix. Lymphatic: No lymphadenopathy. Reproductive: Hysterectomy. Other: No free intraperitoneal fluid or air. Musculoskeletal: No acute fracture. IMPRESSION: 1. Question mild colitis of the ascending, transverse, and descending colon. No evidence of active GI bleeding. 2. Colonic diverticulosis. 3. Aortic Atherosclerosis (ICD10-I70.0). Electronically Signed   By: Rozell Cornet M.D.   On: 04/05/2024 22:23   CT ABDOMEN PELVIS WO CONTRAST Result Date:  04/05/2024 CLINICAL DATA:  Blood in stool. GI bleed with need for transfusion 1 year ago. Abdominal pain. EXAM: CT ABDOMEN AND PELVIS WITHOUT CONTRAST TECHNIQUE: Multidetector CT imaging of the abdomen and pelvis was performed following the standard protocol without IV contrast. RADIATION DOSE REDUCTION: This exam was performed according to the departmental dose-optimization program which includes automated exposure control, adjustment of the mA and/or kV according to patient size and/or use of iterative reconstruction technique. COMPARISON:  CT 07/25/2022 FINDINGS: Lower chest: No acute abnormality. Hepatobiliary: Unremarkable liver. Normal gallbladder. No biliary dilation. Pancreas: Unremarkable. Spleen: Unremarkable. Adrenals/Urinary Tract: Normal adrenal glands. Bilateral nonobstructing renal calculi. No hydronephrosis. Bladder is unremarkable. Stomach/Bowel: Stomach is within normal limits. Normal caliber large and small bowel. Colonic diverticulosis without diverticulitis. No bowel wall thickening. Normal appendix. Postoperative change about the sigmoid colon. Assessment for active GI bleeding is not possible without IV contrast. Vascular/Lymphatic: Aortic atherosclerosis. No enlarged abdominal or pelvic lymph nodes. Reproductive: Status post hysterectomy. No adnexal masses. Other: No free intraperitoneal fluid or air. Musculoskeletal: No acute fracture. IMPRESSION: 1. No acute abnormality in the abdomen or pelvis. 2. Colonic diverticulosis without diverticulitis. 3. Bilateral nonobstructing renal calculi. 4. Aortic Atherosclerosis (ICD10-I70.0). Electronically Signed   By: Rozell Cornet M.D.   On: 04/05/2024 20:42    Procedures .Critical Care  Performed by: Darletta Ehrich, PA-C Authorized by: Darletta Ehrich, PA-C   Critical care provider statement:    Critical care time (minutes):  35   Critical care time was exclusive of:  Separately billable procedures and treating other patients    Critical care was necessary to treat or prevent imminent or life-threatening deterioration of the following conditions:  Circulatory failure   Critical care was time spent personally by me on the following activities:  Blood draw for specimens, development of treatment plan with patient or surrogate, discussions with consultants, ordering and performing treatments and interventions, ordering and review of laboratory studies, ordering and review of radiographic studies, pulse oximetry and re-evaluation of patient's condition    Medications Ordered in ED Medications  sodium chloride  0.9 % bolus 1,000 mL (0 mLs Intravenous Stopped 04/05/24 2202)  pantoprazole  (PROTONIX ) injection 40 mg (40 mg Intravenous Given 04/05/24 1931)  sodium chloride  0.9 % bolus 1,000 mL (0 mLs Intravenous Stopped 04/05/24 2202)  0.9 %  sodium chloride  infusion (Manually program via Guardrails IV Fluids) ( Intravenous New Bag/Given 04/05/24 2229)  iohexol  (OMNIPAQUE ) 350 MG/ML injection 75 mL (75 mLs Intravenous Contrast Given 04/05/24 2154)    ED Course/ Medical Decision Making/ A&P Clinical Course as  of 04/05/24 2250  Sat Apr 05, 2024  2044 I was notified by nursing staff that the patient's has began to deteriorate clinically.  She had a syncopal episode in the bathroom is still passing large amount of clots.  Given that the patient is GFR is at cutoff we will go ahead and bypass this given the acute change in her status.  Will get a CT angio GI bleed with the code medical to make sure she gets to the scan promptly and getting a repeat CBC.  We will also be starting so more fluids as well. [CF]  2104 I spoke with Dr. Kimble Pennant with gastroenterology who agrees to consult the patient and agrees on treatment plan. [CF]  2236 On repeat evaluation, patient is feeling much better.  Her vital signs have improved.  Patient is hypertensive in the 180s systolic and her heart rate has improved.  Patient is more alert.  Blood is currently  running. [CF]  2244 CBC with Differential(!) There is evidence of anemia which is close patient's baseline. [CF]  2245 I spoke with Dr. Brice Campi with [facility] hospitalist who agrees to admit the patient. [CF]  2247 CBC(!) Repeat hemoglobin shows a 2 g drop since she has been here.  Will start a unit of blood. [CF]  2247 Comprehensive metabolic panel(!) Hyperkalemia. [CF]  2247 CT ABDOMEN PELVIS WO CONTRAST No significant abnormalities.  [facility] with radiology interpretation. [CF]  2248 CT ANGIO GI BLEED I agree with the radiologist or potation.  No acute abnormalities at this time apart from colitis. [CF]    Clinical Course User Index [CF] Darletta Ehrich, PA-C   {   Click here for ABCD2, HEART and other calculators  Medical Decision Making TEAGYN FISHEL is a 83 y.o. female patient presents to the emerged part today for further evaluation of rectal bleeding.  Patient has grossly bloody maroon-colored stool here.  No abdominal tenderness.  Will plan to get basic labs, type and screen, CT abdomen pelvis with contrast.  Also give the patient some fluids as well.  As highlighted in the ED course, patient had a syncopal event likely secondary to volume of blood loss from GI bleeding.  Patient was promptly brought back to her room given more fluids, repeat CBC did show a 2 g drop while she was here.  1 unit of blood was started.  Patient is doing much better.  Blood is currently running.  Heart rate and blood pressure in a much better spot.  Patient more alert.  Gastroenterology will service consult for the GI bleed.  Due to the patient's condition I do feel the patient would likely benefit from further evaluation in the hospital.  She is currently stable for admission.  I will get her admitted to the hospital service.  Amount and/or Complexity of Data Reviewed Labs: ordered. Decision-making details documented in ED Course. Radiology: ordered. Decision-making details documented in ED  Course.  Risk Prescription drug management. Decision regarding hospitalization.    Final Clinical Impression(s) / ED Diagnoses Final diagnoses:  Rectal bleeding  Gastrointestinal hemorrhage, unspecified gastrointestinal hemorrhage type    Rx / DC Orders ED Discharge Orders     None         Darletta Ehrich, PA-C 04/05/24 2250    Angelyn Kennel M, PA-C 04/05/24 2250    Russella Courts A, DO 04/07/24 1648

## 2024-04-05 NOTE — ED Notes (Signed)
 Consent  for blood is signed electronically

## 2024-04-05 NOTE — ED Triage Notes (Signed)
 Patient BIB [facility] Bld in stool since 11 am today H/o GI bleed with need for transfusion, last 1 year ago No thinners No other complaints or symptoms  Vitals with EMS   Bp 164/64 Heart rate 58 Cbg 99 O2 99

## 2024-04-06 ENCOUNTER — Inpatient Hospital Stay (HOSPITAL_COMMUNITY)

## 2024-04-06 ENCOUNTER — Encounter (HOSPITAL_COMMUNITY): Payer: Self-pay

## 2024-04-06 ENCOUNTER — Encounter (HOSPITAL_COMMUNITY): Payer: Self-pay | Admitting: Family Medicine

## 2024-04-06 ENCOUNTER — Other Ambulatory Visit: Payer: Self-pay

## 2024-04-06 DIAGNOSIS — K922 Gastrointestinal hemorrhage, unspecified: Secondary | ICD-10-CM | POA: Diagnosis not present

## 2024-04-06 DIAGNOSIS — G4733 Obstructive sleep apnea (adult) (pediatric): Secondary | ICD-10-CM

## 2024-04-06 DIAGNOSIS — N1832 Chronic kidney disease, stage 3b: Secondary | ICD-10-CM | POA: Diagnosis not present

## 2024-04-06 DIAGNOSIS — I10 Essential (primary) hypertension: Secondary | ICD-10-CM | POA: Diagnosis not present

## 2024-04-06 HISTORY — PX: IR EMBO ART  VEN HEMORR LYMPH EXTRAV  INC GUIDE ROADMAPPING: IMG5450

## 2024-04-06 HISTORY — PX: IR ANGIOGRAM VISCERAL SELECTIVE: IMG657

## 2024-04-06 HISTORY — PX: IR US GUIDE VASC ACCESS RIGHT: IMG2390

## 2024-04-06 HISTORY — PX: IR ANGIOGRAM SELECTIVE EACH ADDITIONAL VESSEL: IMG667

## 2024-04-06 LAB — BASIC METABOLIC PANEL WITH GFR
Anion gap: 10 (ref 5–15)
BUN: 22 mg/dL (ref 8–23)
CO2: 18 mmol/L — ABNORMAL LOW (ref 22–32)
Calcium: 8.9 mg/dL (ref 8.9–10.3)
Chloride: 116 mmol/L — ABNORMAL HIGH (ref 98–111)
Creatinine, Ser: 1.18 mg/dL — ABNORMAL HIGH (ref 0.44–1.00)
GFR, Estimated: 46 mL/min — ABNORMAL LOW (ref >60)
Glucose, Bld: 83 mg/dL (ref 70–99)
Potassium: 4.4 mmol/L (ref 3.5–5.1)
Sodium: 144 mmol/L (ref 135–145)

## 2024-04-06 LAB — CBC
HCT: 32.5 % — ABNORMAL LOW (ref 36.0–46.0)
HCT: 26 % — ABNORMAL LOW (ref 36.0–46.0)
HCT: 35.9 % — ABNORMAL LOW (ref 36.0–46.0)
Hemoglobin: 10.5 g/dL — ABNORMAL LOW (ref 12.0–15.0)
Hemoglobin: 8.2 g/dL — ABNORMAL LOW (ref 12.0–15.0)
Hemoglobin: 12 g/dL (ref 12.0–15.0)
MCH: 30.9 pg (ref 26.0–34.0)
MCH: 30.7 pg (ref 26.0–34.0)
MCH: 31.9 pg (ref 26.0–34.0)
MCHC: 32.3 g/dL (ref 30.0–36.0)
MCHC: 31.5 g/dL (ref 30.0–36.0)
MCHC: 33.4 g/dL (ref 30.0–36.0)
MCV: 95.6 fL (ref 80.0–100.0)
MCV: 97.4 fL (ref 80.0–100.0)
MCV: 95.5 fL (ref 80.0–100.0)
Platelets: 169 10*3/uL (ref 150–400)
Platelets: 148 10*3/uL — ABNORMAL LOW (ref 150–400)
Platelets: 127 10*3/uL — ABNORMAL LOW (ref 150–400)
RBC: 3.4 MIL/uL — ABNORMAL LOW (ref 3.87–5.11)
RBC: 2.67 MIL/uL — ABNORMAL LOW (ref 3.87–5.11)
RBC: 3.76 MIL/uL — ABNORMAL LOW (ref 3.87–5.11)
RDW: 13.4 % (ref 11.5–15.5)
RDW: 13.4 % (ref 11.5–15.5)
RDW: 13.8 % (ref 11.5–15.5)
WBC: 6 10*3/uL (ref 4.0–10.5)
WBC: 4.7 10*3/uL (ref 4.0–10.5)
WBC: 7.7 10*3/uL (ref 4.0–10.5)
nRBC: 0 % (ref 0.0–0.2)
nRBC: 0 % (ref 0.0–0.2)
nRBC: 0 % (ref 0.0–0.2)

## 2024-04-06 LAB — URINALYSIS, W/ REFLEX TO CULTURE (INFECTION SUSPECTED)
Bacteria, UA: NONE SEEN
Bilirubin Urine: NEGATIVE
Glucose, UA: NEGATIVE mg/dL
Ketones, ur: 5 mg/dL — AB
Leukocytes,Ua: NEGATIVE
Nitrite: NEGATIVE
Protein, ur: NEGATIVE mg/dL
Specific Gravity, Urine: 1.027 (ref 1.005–1.030)
pH: 5 (ref 5.0–8.0)

## 2024-04-06 LAB — MRSA NEXT GEN BY PCR, NASAL: MRSA by PCR Next Gen: NOT DETECTED

## 2024-04-06 LAB — PREPARE RBC (CROSSMATCH)

## 2024-04-06 LAB — PROTIME-INR
INR: 1.2 (ref 0.8–1.2)
Prothrombin Time: 15.7 s — ABNORMAL HIGH (ref 11.4–15.2)

## 2024-04-06 MED ORDER — METOPROLOL SUCCINATE ER 25 MG PO TB24
25.0000 mg | ORAL_TABLET | Freq: Once | ORAL | Status: AC
  Administered 2024-04-06: 25 mg via ORAL
  Filled 2024-04-06: qty 1

## 2024-04-06 MED ORDER — ACETAMINOPHEN 650 MG RE SUPP: 650.0000 mg | Freq: Four times a day (QID) | RECTAL | Status: DC | PRN

## 2024-04-06 MED ORDER — MIDAZOLAM HCL 2 MG/2ML IJ SOLN
INTRAMUSCULAR | Status: AC
  Filled 2024-04-06: qty 2

## 2024-04-06 MED ORDER — METOPROLOL SUCCINATE ER 25 MG PO TB24
25.0000 mg | ORAL_TABLET | Freq: Every day | ORAL | Status: DC
  Administered 2024-04-06: 25 mg via ORAL
  Filled 2024-04-06: qty 1

## 2024-04-06 MED ORDER — HYDRALAZINE HCL 20 MG/ML IJ SOLN: 10.0000 mg | Freq: Four times a day (QID) | INTRAMUSCULAR | Status: DC | PRN

## 2024-04-06 MED ORDER — METOPROLOL SUCCINATE ER 25 MG PO TB24
50.0000 mg | ORAL_TABLET | Freq: Every day | ORAL | Status: DC
  Administered 2024-04-07 – 2024-04-08 (×2): 50 mg via ORAL
  Filled 2024-04-06 (×3): qty 2

## 2024-04-06 MED ORDER — IODIXANOL 320 MG/ML IV SOLN
50.0000 mL | Freq: Once | INTRAVENOUS | Status: AC | PRN
  Administered 2024-04-06: 12 mL via INTRA_ARTERIAL

## 2024-04-06 MED ORDER — CHLORHEXIDINE GLUCONATE CLOTH 2 % EX PADS
6.0000 | MEDICATED_PAD | Freq: Every day | CUTANEOUS | Status: DC
  Administered 2024-04-06 – 2024-04-07 (×2): 6 via TOPICAL

## 2024-04-06 MED ORDER — SODIUM CHLORIDE 0.9% IV SOLUTION: Freq: Once | INTRAVENOUS | Status: AC

## 2024-04-06 MED ORDER — DIPHENHYDRAMINE HCL 50 MG/ML IJ SOLN
INTRAMUSCULAR | Status: AC
  Filled 2024-04-06: qty 1

## 2024-04-06 MED ORDER — LIDOCAINE HCL 1 % IJ SOLN
20.0000 mL | Freq: Once | INTRAMUSCULAR | Status: AC
  Administered 2024-04-06: 10 mL via INTRADERMAL

## 2024-04-06 MED ORDER — NITROGLYCERIN 1 MG/10 ML FOR IR/CATH LAB
INTRA_ARTERIAL | Status: AC | PRN
  Administered 2024-04-06: 100 ug via INTRA_ARTERIAL

## 2024-04-06 MED ORDER — SODIUM CHLORIDE 0.9 % IV BOLUS
1000.0000 mL | Freq: Once | INTRAVENOUS | Status: AC
  Administered 2024-04-06: 1000 mL via INTRAVENOUS

## 2024-04-06 MED ORDER — DIPHENHYDRAMINE HCL 50 MG/ML IJ SOLN
INTRAMUSCULAR | Status: AC | PRN
  Administered 2024-04-06: 12.5 mg via INTRAVENOUS

## 2024-04-06 MED ORDER — ACETAMINOPHEN 325 MG PO TABS: 650.0000 mg | ORAL_TABLET | Freq: Four times a day (QID) | ORAL | Status: DC | PRN

## 2024-04-06 MED ORDER — IODIXANOL 320 MG/ML IV SOLN
50.0000 mL | Freq: Once | INTRAVENOUS | Status: AC | PRN
  Administered 2024-04-06: 1 mL via INTRA_ARTERIAL

## 2024-04-06 MED ORDER — MIDAZOLAM HCL 2 MG/2ML IJ SOLN
INTRAMUSCULAR | Status: AC | PRN
  Administered 2024-04-06 (×4): .5 mg via INTRAVENOUS

## 2024-04-06 MED ORDER — HYDROMORPHONE HCL 1 MG/ML IJ SOLN
1.0000 mg | INTRAMUSCULAR | Status: DC | PRN
  Administered 2024-04-06 – 2024-04-07 (×4): 1 mg via INTRAVENOUS
  Filled 2024-04-06 (×4): qty 1

## 2024-04-06 MED ORDER — SODIUM CHLORIDE 0.9 % IV BOLUS: 1000.0000 mL | Freq: Once | INTRAVENOUS | Status: AC

## 2024-04-06 MED ORDER — NITROGLYCERIN IN D5W 100-5 MCG/ML-% IV SOLN
INTRAVENOUS | Status: AC
  Filled 2024-04-06: qty 250

## 2024-04-06 MED ORDER — ORAL CARE MOUTH RINSE: 15.0000 mL | OROMUCOSAL | Status: DC | PRN

## 2024-04-06 MED ORDER — ONDANSETRON HCL 4 MG/2ML IJ SOLN: 4.0000 mg | Freq: Four times a day (QID) | INTRAMUSCULAR | Status: DC | PRN

## 2024-04-06 MED ORDER — IODIXANOL 320 MG/ML IV SOLN
50.0000 mL | Freq: Once | INTRAVENOUS | Status: AC | PRN
  Administered 2024-04-06: 24 mL via INTRA_ARTERIAL

## 2024-04-06 MED ORDER — LIDOCAINE HCL 1 % IJ SOLN
INTRAMUSCULAR | Status: AC
  Filled 2024-04-06: qty 20

## 2024-04-06 MED ORDER — PANTOPRAZOLE SODIUM 40 MG IV SOLR
40.0000 mg | Freq: Every day | INTRAVENOUS | Status: DC
  Administered 2024-04-06 – 2024-04-07 (×2): 40 mg via INTRAVENOUS
  Filled 2024-04-06 (×2): qty 10

## 2024-04-06 MED ORDER — SODIUM CHLORIDE 0.9% FLUSH
3.0000 mL | Freq: Two times a day (BID) | INTRAVENOUS | Status: DC
  Administered 2024-04-06 – 2024-04-08 (×6): 3 mL via INTRAVENOUS

## 2024-04-06 MED ORDER — IOHEXOL 350 MG/ML SOLN
100.0000 mL | Freq: Once | INTRAVENOUS | Status: AC | PRN
  Administered 2024-04-06: 100 mL via INTRAVENOUS

## 2024-04-06 MED ORDER — SODIUM CHLORIDE 0.9 % IV SOLN: INTRAVENOUS | Status: AC

## 2024-04-06 MED ORDER — HYDRALAZINE HCL 20 MG/ML IJ SOLN
10.0000 mg | Freq: Four times a day (QID) | INTRAMUSCULAR | Status: DC | PRN
  Administered 2024-04-06 – 2024-04-08 (×2): 10 mg via INTRAVENOUS
  Filled 2024-04-06 (×2): qty 1

## 2024-04-06 MED ORDER — ONDANSETRON HCL 4 MG PO TABS: 4.0000 mg | ORAL_TABLET | Freq: Four times a day (QID) | ORAL | Status: DC | PRN

## 2024-04-06 MED ORDER — IODIXANOL 320 MG/ML IV SOLN
50.0000 mL | Freq: Once | INTRAVENOUS | Status: AC | PRN
  Administered 2024-04-06: 25 mL via INTRA_ARTERIAL

## 2024-04-06 NOTE — Progress Notes (Signed)
 Consult received for hematochezia.  Patient had repeat CT angiogram due to further bleeding, showing bleeding in terminal ileum.  Patient in IR when I have tried to see her.  [facility] will revisit tomorrow.

## 2024-04-06 NOTE — Progress Notes (Signed)
 Patient had large bright red bloody BM this morning at 0700. BP stable.

## 2024-04-06 NOTE — Plan of Care (Signed)

## 2024-04-06 NOTE — Progress Notes (Signed)
 Called to patient's room by her husband who stated she felt like she was going to pass out. She was awake but drowsy sitting on the BSC. She was immediately moved into the bed and placed trendelenburg in the bed with feet elevated. Radial pulse was weak but present; patient never lost consciousness. Maintenance IV fluids changed to rapid bolus at 934ml/hr.   Rapid response called to bedside and patient moved emergently to stepdown unit where ordered blood was started and physician to bedside. Stepdown to assume care at this time.

## 2024-04-06 NOTE — Progress Notes (Signed)
   04/06/24 0243  BiPAP/CPAP/SIPAP  $ Non-Invasive Home Ventilator  Initial  $ Face Mask Small Yes  Reason BIPAP/CPAP not in use Non-compliant  Patient Home Mask No  Patient Home Tubing No  Auto Titrate No  CPAP/SIPAP surface wiped down Yes  Device Plugged into RED Power Outlet Yes  BiPAP/CPAP /SiPAP Vitals  Resp 15  MEWS Score/Color  MEWS Score 0  MEWS Score Color Green

## 2024-04-06 NOTE — Progress Notes (Signed)
 Patient returned from IR with elevated blood pressures following arteriography and embolization. Per order set, this RN notified the radiology MD, Dr Nereida Banning when the systolic blood pressure was greater than 180. This RN was told to defer to hospitalist. This RN gave the PRN antihypertensives already ordered and notified Dr. Thelma Fire. Additional dose of Metoprolol  was ordered and PRN hypertensives were adjusted.

## 2024-04-06 NOTE — Progress Notes (Addendum)
 [facility]                                                                              Stephanie Barber, is a 83 y.o. female, DOB - 1940-12-29, AOZ:[id number] Admit date - 04/05/2024    Outpatient Primary MD for the patient is Sun, Vyvyan, MD  LOS - 1  days  Chief Complaint  Patient presents with   GI Bleeding       Brief summary   Patient is a 83 year old female with HTN, hyperlipidemia, diet-controlled diabetes mellitus, OSA, CKD stage IIIb, diverticulosis presented with rectal bleeding.  Patient reported that she was in her usual state of health, had something to eat at approximately 11 AM.  30 minutes later she reports passing maroon-colored blood in the stool she went on to have several more episodes of painless hematochezia.  No abdominal pain nausea vomiting hematemesis fevers or chills. Hemoglobin 10.5 on arrival, subsequently dropped to 8.4 and was transfused 1 unit packed RBCs Patient has a history of known diverticulosis, admitted for further workup CTA GI bleed on admission was negative  Assessment & Plan    Principal Problem:   Acute lower GI bleeding, acute blood loss anemia in the setting of known diverticular disease -Ongoing active GI bleed, CTA GI bleed on admission was negative.  No fever chills nausea vomiting or abdominal pain.  Not on any blood thinners.  Previous colonoscopy 07/28/2022 showed diverticulosis in the sigmoid colon, descending colon, transverse colon, ascending colon and rectum.  Patent end-to-side colocolonic anastomosis.  EGD 06/17/2021 normal - Hemoglobin this morning 10.5, subsequently had 3 more bloody BMs, repeat H&H - Given ongoing GI bleeding, repeat CTA abdomen pelvis - N.p.o., continue IV fluids,  - GI consulted, Dr. Kimble Pennant will eval for further lesions.  Addendum: 12:00pm CTA with active GI bleed in the terminal ileum.  Discussed with GI, Dr. Kimble Pennant, not amenable to colonoscopy with active bleeding - Discussed with IR,  Dr. Nereida Banning, will need embolization.  Ordered stat PT/INR - Ordered 2 units of blood, will transfer to stepdown unit in the light of active ongoing GI bleeding and high risk of decompensation  Active Problems:   Hypertension -BP readings elevated - Outpatient on amlodipine  10 mg daily, Toprol -XL 50 mg daily - Will resume Toprol -XL 25 mg daily and adjust antihypertensives as needed  History of GERD - Continue PPI daily    OSA  -on CPAP     Stage 3b chronic kidney disease (CKD) (HCC) with NAG metabolic acidosis Baseline creatinine 1.5 - presented with creatinine of 1.63, improving to 1.1  Estimated body mass index is 22.35 kg/m as calculated from the following:   Height as of this encounter: 4\' 11"  (1.499 m).   Weight as of this encounter: 50.2 kg.  Code Status: Full code DVT Prophylaxis:  SCDs Start: 04/06/24 0004   Level of Care: Level of care: Progressive Family Communication: Updated patient's husband at the bedside Disposition Plan:      Remains inpatient appropriate: Pending GI evaluation   Procedures:  CTA GI bleed protocol  Consultants:   Gastroenterology  Antimicrobials:   Anti-infectives (From admission, onward)  None          Medications  sodium chloride  flush  3 mL Intravenous Q12H      Subjective:   Avleen Cobos was seen and examined today.  Continues to have bloody BMs, no nausea vomiting abdominal pain.  No fevers or chills.  Denies any chest pain, shortness of breath.  Husband at the bedside.  Objective:   Vitals:   04/06/24 0243 04/06/24 0249 04/06/24 0716 04/06/24 0917  BP:  (!) 165/75 (!) 143/66 (!) 161/48  Pulse:  62 73 66  Resp: 15 20 18 20   Temp:  98.1 F (36.7 C)  98.4 F (36.9 C)  TempSrc:  Oral  Oral  SpO2:  100% 99% 100%  Weight:      Height:        Intake/Output Summary (Last 24 hours) at 04/06/2024 1007 Last data filed at 04/06/2024 0230 Gross per 24 hour  Intake 2512.5 ml  Output --  Net 2512.5 ml      Wt Readings from Last 3 Encounters:  04/06/24 50.2 kg  01/11/24 50.7 kg  12/20/23 52.2 kg     Exam General: Alert and oriented x 3, NAD Cardiovascular: S1 S2 auscultated,  RRR Respiratory: Clear to auscultation bilaterally, no wheezing Gastrointestinal: Soft, nontender, nondistended, + bowel sounds Ext: no pedal edema bilaterally Neuro: Strength 5/5 upper and lower extremities bilaterally Psych: Normal affect     Data Reviewed:  I have personally reviewed following labs    CBC Lab Results  Component Value Date   WBC 6.0 04/06/2024   RBC 3.40 (L) 04/06/2024   HGB 10.5 (L) 04/06/2024   HCT 32.5 (L) 04/06/2024   MCV 95.6 04/06/2024   MCH 30.9 04/06/2024   PLT 169 04/06/2024   MCHC 32.3 04/06/2024   RDW 13.4 04/06/2024   LYMPHSABS 1.1 04/05/2024   MONOABS 0.4 04/05/2024   EOSABS 0.1 04/05/2024   BASOSABS 0.0 04/05/2024     Last metabolic panel Lab Results  Component Value Date   NA 144 04/06/2024   K 4.4 04/06/2024   CL 116 (H) 04/06/2024   CO2 18 (L) 04/06/2024   BUN 22 04/06/2024   CREATININE 1.18 (H) 04/06/2024   GLUCOSE 83 04/06/2024   GFRNONAA 46 (L) 04/06/2024   GFRAA 50 (L) 06/03/2019   CALCIUM  8.9 04/06/2024   PHOS 3.4 05/06/2017   PROT 7.6 04/05/2024   ALBUMIN 4.0 04/05/2024   BILITOT 1.0 04/05/2024   ALKPHOS 51 04/05/2024   AST 38 04/05/2024   ALT 23 04/05/2024   ANIONGAP 10 04/06/2024    CBG (last 3)  Recent Labs    04/05/24 2032  GLUCAP 105*      Coagulation Profile: No results for input(s): "INR", "PROTIME" in the last 168 hours.   Radiology Studies: I have personally reviewed the imaging studies  CT ANGIO GI BLEED Result Date: 04/05/2024 CLINICAL DATA:  Lower GI bleed, bright red blood EXAM: CTA ABDOMEN AND PELVIS WITHOUT AND WITH CONTRAST TECHNIQUE: Multidetector CT imaging of the abdomen and pelvis was performed using the standard protocol during bolus administration of intravenous contrast. Multiplanar reconstructed  images and MIPs were obtained and reviewed to evaluate the vascular anatomy. RADIATION DOSE REDUCTION: This exam was performed according to the departmental dose-optimization program which includes automated exposure control, adjustment of the mA and/or kV according to patient size and/or use of iterative reconstruction technique. CONTRAST:  75mL OMNIPAQUE  IOHEXOL  350 MG/ML SOLN COMPARISON:  CT abdomen pelvis earlier today FINDINGS: VASCULAR Calcified atherosclerotic plaque  in the aorta and its mesenteric, renal, and iliac artery branches without hemodynamically significant stenosis, aneurysm, or dissection. Review of the MIP images confirms the above findings. NON-VASCULAR Lower chest: No acute abnormality. Hepatobiliary: No acute abnormality. Hypoattenuating lesions in the left hepatic lobe are too small to definitively characterize but likely represent benign cysts. Pancreas: Unremarkable. Spleen: Unremarkable. Adrenals/Urinary Tract: Normal adrenal glands. Nonobstructing by rough lateral nephrolithiasis. No urinary calculi or hydronephrosis. Unremarkable bladder. Stomach/Bowel: Stomach is within normal limits. No bowel obstruction. Colonic diverticulosis without evidence of diverticulitis. Question mild wall thickening and mucosal hyperenhancement of the ascending, transverse, and descending colon. No adjacent inflammatory stranding or fluid. No evidence of active GI bleeding. Postoperative change about the sigmoid colon. Normal appendix. Lymphatic: No lymphadenopathy. Reproductive: Hysterectomy. Other: No free intraperitoneal fluid or air. Musculoskeletal: No acute fracture. IMPRESSION: 1. Question mild colitis of the ascending, transverse, and descending colon. No evidence of active GI bleeding. 2. Colonic diverticulosis. 3. Aortic Atherosclerosis (ICD10-I70.0). Electronically Signed   By: Rozell Cornet M.D.   On: 04/05/2024 22:23   CT ABDOMEN PELVIS WO CONTRAST Result Date: 04/05/2024 CLINICAL DATA:   Blood in stool. GI bleed with need for transfusion 1 year ago. Abdominal pain. EXAM: CT ABDOMEN AND PELVIS WITHOUT CONTRAST TECHNIQUE: Multidetector CT imaging of the abdomen and pelvis was performed following the standard protocol without IV contrast. RADIATION DOSE REDUCTION: This exam was performed according to the departmental dose-optimization program which includes automated exposure control, adjustment of the mA and/or kV according to patient size and/or use of iterative reconstruction technique. COMPARISON:  CT 07/25/2022 FINDINGS: Lower chest: No acute abnormality. Hepatobiliary: Unremarkable liver. Normal gallbladder. No biliary dilation. Pancreas: Unremarkable. Spleen: Unremarkable. Adrenals/Urinary Tract: Normal adrenal glands. Bilateral nonobstructing renal calculi. No hydronephrosis. Bladder is unremarkable. Stomach/Bowel: Stomach is within normal limits. Normal caliber large and small bowel. Colonic diverticulosis without diverticulitis. No bowel wall thickening. Normal appendix. Postoperative change about the sigmoid colon. Assessment for active GI bleeding is not possible without IV contrast. Vascular/Lymphatic: Aortic atherosclerosis. No enlarged abdominal or pelvic lymph nodes. Reproductive: Status post hysterectomy. No adnexal masses. Other: No free intraperitoneal fluid or air. Musculoskeletal: No acute fracture. IMPRESSION: 1. No acute abnormality in the abdomen or pelvis. 2. Colonic diverticulosis without diverticulitis. 3. Bilateral nonobstructing renal calculi. 4. Aortic Atherosclerosis (ICD10-I70.0). Electronically Signed   By: Rozell Cornet M.D.   On: 04/05/2024 20:42       Deb Loudin M.D. [facility] 04/06/2024, 10:07 AM  Available via Epic secure chat 7am-7pm After 7 pm, please refer to night coverage provider listed on amion.

## 2024-04-06 NOTE — Consult Note (Addendum)
 Chief Complaint: GI Bleed  Referring Provider(s): Ripudeep Rai  Supervising Physician: Erica Hau  Patient Status: [facility] - In-pt  History of Present Illness: Stephanie Barber is a 83 y.o. female  with medical issues including hypertension, hyperlipidemia, diet-controlled diabetes mellitus, OSA, CKD 3B, and diverticulosis.  She presented to the ED yestereday with rectal bleeding.   Per chart, around 11 AM yesterday she passed maroon-colored stool.    She denies any abdominal pain, nausea, vomiting, fever, or chills.   Upon arrival to the ED her vitals were stable.  Labs showed hemoglobin 10.5 which dropped to 8.4 after 3 hours.   CTA showed active bleeding in the terminal ileum.  IR is asked to evaluate for embolization. - Creatinine is 1.5 which puts her at some risk for contrast nephropathy.  Review of medications = no anticoagulation medications.  I do not see a recent INR - I will order one STAT.  She is NPO. No nausea/vomiting. No Fever/chills. ROS negative.  Patient has an ANAPHYLAXIS reaction to morphine, causing patient to flat-line if given. No fentanyl  for sedation or procedure.   Patient is Full Code  Past Medical History:  Diagnosis Date   Arthritis    Chronic headache    Complication of anesthesia    allergy to Novocaine    Diabetes mellitus    GERD (gastroesophageal reflux disease)    GI bleed    Heart murmur    High cholesterol    Hyperlipidemia    Hypertension    Intracranial atherosclerosis    per MRA   Migraine    Narcolepsy and cataplexy    OSA    states has not used CPAP in over a year   Peripheral vascular disease (HCC)    Rectal prolapse    Stage 3b chronic kidney disease (CKD) (HCC)    Trigger finger of all digits of right hand     Past Surgical History:  Procedure Laterality Date   ABDOMINAL HYSTERECTOMY     Fibroids   BACK SURGERY     L4,L5 discectomy   BOTOX  INJECTION N/A 01/02/2023   Procedure: BOTOX   INJECTION;  Surgeon: Roxane Copp, MD;  Location: [facility];  Service: Urology;  Laterality: N/A;   CARDIAC CATHETERIZATION     CARPAL TUNNEL RELEASE Right 01/18/2017   COLONOSCOPY N/A 07/28/2022   Procedure: COLONOSCOPY;  Surgeon: Ozell Blunt, MD;  Location: [facility];  Service: Gastroenterology;  Laterality: N/A;   COLONOSCOPY WITH PROPOFOL  N/A 06/17/2021   Procedure: COLONOSCOPY WITH PROPOFOL ;  Surgeon: Baldo Bonds, MD;  Location: [facility];  Service: Endoscopy;  Laterality: N/A;   CYSTOSCOPY WITH INJECTION N/A 01/02/2023   Procedure: CYSTOSCOPY WITH [place];  Surgeon: Roxane Copp, MD;  Location: [facility];  Service: Urology;  Laterality: N/A;  45 MINS   ESOPHAGOGASTRODUODENOSCOPY (EGD) WITH PROPOFOL  N/A 06/17/2021   Procedure: ESOPHAGOGASTRODUODENOSCOPY (EGD) WITH PROPOFOL ;  Surgeon: Baldo Bonds, MD;  Location: [facility];  Service: Endoscopy;  Laterality: N/A;   KIDNEY SURGERY  1998   Right.  growth removed   POLYPECTOMY  06/17/2021   Procedure: POLYPECTOMY;  Surgeon: Baldo Bonds, MD;  Location: [facility];  Service: Endoscopy;;   POLYPECTOMY  07/28/2022   Procedure: POLYPECTOMY;  Surgeon: Ozell Blunt, MD;  Location: [facility];  Service: Gastroenterology;;   rectal prolapse repair  12/05/2011   ROTATOR CUFF REPAIR     Left   SHOULDER ARTHROSCOPY WITH SUBACROMIAL DECOMPRESSION, ROTATOR CUFF REPAIR AND BICEP TENDON REPAIR Left 05/25/2015  Procedure: SHOULDER ARTHROSCOPY WITH SUBACROMIAL DECOMPRESSION, ROTATOR CUFF REPAIR AND BICEP TENODESIS, DEBRIDEMENT. ;  Surgeon: Jasmine Mesi, MD;  Location: [facility];  Service: Orthopedics;  Laterality: Left;  LEFT SHOULDER ROTATOR CUFF TEAR REPAIR, ARTHROSCOPY, DEBRIDEMENT, BICEPS TENODESIS, SUBACROMIAL DECOMPRESSION.   TOTAL ABDOMINAL HYSTERECTOMY     TRIGGER FINGER RELEASE Right 04/30/2018   Procedure: RELEASE TRIGGER FINGER/A-1 PULLEY RIGHT MIDDLE  AND SMALLn ;  Surgeon: Lyanne Sample, MD;  Location: [facility];  Service: Orthopedics;  Laterality: Right;  FAB, Bier block   TUBAL LIGATION      Allergies: Morphine and codeine and Lactose intolerance (gi)  Medications: Prior to Admission medications   Medication Sig Start Date End Date Taking? Authorizing Provider  acetaminophen  (TYLENOL ) 500 MG tablet Take 500 mg by mouth daily.   Yes [provider]  metFORMIN  (GLUCOPHAGE ) 500 MG tablet Take 500 mg by mouth daily with breakfast.   Yes [provider]  metoprolol  succinate (TOPROL -XL) 50 MG 24 hr tablet Take 50 mg by mouth in the morning.   Yes [provider]  mirtazapine  (REMERON ) 15 MG tablet Take 1 tablet (15 mg total) by mouth at bedtime. Patient not taking: Reported on 04/06/2024 11/28/23   Mozingo, Regina Nattalie, NP  pantoprazole  (PROTONIX ) 40 MG tablet Take 1 tablet (40 mg total) by mouth daily. Patient not taking: Reported on 04/06/2024 07/29/22 12/20/23  Vada Garibaldi, MD  rosuvastatin  (CRESTOR ) 40 MG tablet Take 1 tablet (40 mg total) by mouth at bedtime. 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  Tobacco Use   Smoking status: Former    Current packs/day: 0.00    Types: Cigarettes    Quit date: 11/13/1980    Years since quitting: 43.4   Smokeless tobacco: Never   Tobacco comments:    quit in 1982  Vaping Use   Vaping status: Never Used  Substance and Sexual Activity   Alcohol  use: No    Alcohol /week: 0.0 standard drinks of alcohol    Drug use: No   Sexual activity: Yes   Other Topics Concern   Not on file  Social History Narrative   21 st January 2014 , patient underwent PS and MSLT - MSLT had one  SREMs and an average time to fall asleep of  *.8 minutes , her Ewort is 20 points,: facit: this patient has severe hypersomnia and is at risk when driving. medication in form ogf nuvigil smaples had been dispensed to her but she ha not yet taken it. 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 In detail -discussion of diagnosis and treatment  takes place today , first with nuvigil and if insufficient, with XYREM. Her CPAP treats her OSA very well, and  she uses it 6 hours  or more each night,  residual AHI of 1.1  would not allow for OSA to be still explaining this degree of sleepiness. 2 downloads were reviewed. labs reviewed.    Patient is married Porfirio Bristol) and lives at home with her husband and grandchild.   Patient has four children   Patient is retired.   Patient has a college education.   Patient is right-handed.   Patient does not drink any caffeine.      Social Drivers of Corporate investment banker Strain: Not on file  Food Insecurity: No Food Insecurity (04/06/2024)   Hunger Vital Sign    Worried About Running Out of Food in the Last Year: Never true    Ran Out of Food in the Last Year: Never true  Transportation Needs: No Transportation Needs (04/06/2024)   PRAPARE - Administrator, Civil Service (Medical): No    Lack of Transportation (Non-Medical): No  Physical Activity: Not on file  Stress: Not on file  Social Connections: Socially Integrated (04/06/2024)   Social Connection and Isolation Panel [NHANES]    Frequency of Communication with Friends and Family: More than three times a week    Frequency of Social Gatherings with Friends and Family: Twice a week    Attends Religious Services:  More than 4 times per year    Active Member of [employer] or Organizations: Yes    Attends Banker Meetings: 1 to 4 times per year    Marital Status: Married     Review of Systems: A 12 point ROS discussed and pertinent positives are indicated in the HPI above.  All other systems are negative.    Vital Signs: BP (!) 156/76   Pulse 66   Temp 98.4 F (36.9 C) (Oral)   Resp 20   Ht 4\' 11"  (1.499 m)   Wt 110 lb 10.7 oz (50.2 kg)   SpO2 100%   BMI 22.35 kg/m   Advance Care Plan: The advanced care place/surrogate decision maker was discussed at the time of visit and the patient did not wish to discuss or was not able to name a surrogate decision maker or provide an advance care plan.  Physical Exam Vitals reviewed.  Constitutional:      Appearance: Normal appearance.  HENT:     Head: Normocephalic and atraumatic.  Eyes:     Extraocular Movements: Extraocular movements intact.  Cardiovascular:     Rate and Rhythm: Normal rate and regular rhythm.     Pulses: Normal pulses.  Pulmonary:     Effort: Pulmonary effort is normal.     Breath sounds: Normal breath sounds.  Abdominal:     General: Abdomen is flat.     Palpations: Abdomen is soft.     Tenderness: There is no abdominal tenderness.  Musculoskeletal:        General: Normal range of motion.  Skin:    General: Skin is warm and dry.  Neurological:     General: No focal deficit present.     Mental Status: She is alert and oriented to person, place, and time.  Psychiatric:        Mood and Affect: Mood normal.        Behavior: Behavior normal.        Thought Content: Thought content normal.        Judgment: Judgment normal.     Imaging: CT ANGIO GI BLEED Result Date: 04/06/2024 CLINICAL DATA:  Mesenteric ischemia. Active GI bleed. Diverticulosis. EXAM: CTA ABDOMEN AND PELVIS WITHOUT AND WITH CONTRAST TECHNIQUE: Multidetector CT imaging of the abdomen and pelvis was performed using the standard protocol during  bolus administration of intravenous contrast. Multiplanar reconstructed images and MIPs were obtained and reviewed to evaluate the vascular anatomy. RADIATION DOSE REDUCTION: This exam was performed according to the departmental dose-optimization program which includes automated exposure control, adjustment of the mA and/or kV according to patient size and/or use of iterative reconstruction technique. CONTRAST:  OMNIPAQUE  IOHEXOL  350 MG/ML SOLN COMPARISON:  04/05/2024. FINDINGS: VASCULAR Aorta: Normal caliber aorta without aneurysm, dissection, vasculitis or significant stenosis. There are atheromatous calcifications. Celiac: Patent without evidence of aneurysm, dissection, vasculitis or significant stenosis. SMA: Patent without evidence of aneurysm, dissection, vasculitis or significant stenosis. Renals: Both renal arteries are patent without evidence of aneurysm, dissection, vasculitis, fibromuscular dysplasia or significant stenosis. IMA: Patent without evidence of aneurysm, dissection, vasculitis or significant stenosis. Inflow: Patent without evidence of aneurysm, dissection, vasculitis or significant stenosis. Proximal Outflow: Bilateral common femoral and visualized portions of the superficial and profunda femoral arteries are patent without evidence of aneurysm, dissection, vasculitis or significant stenosis. Veins: No obvious venous abnormality within the limitations of this arterial phase study. Review of the MIP images confirms the above findings. NON-VASCULAR Lower chest: No acute abnormality. No pericardial or pleural effusions. Hepatobiliary: No focal liver abnormality is seen. No biliary ductal dilatation. Gallbladder contains excreted contrast. No pericholecystic inflammatory changes. Pancreas: Unremarkable. No pancreatic ductal dilatation or surrounding inflammatory changes. Spleen: Normal in size without focal abnormality. Adrenals/Urinary Tract: 1 cm stone left kidney. No hydronephrosis. 7  mm stone right kidney. Bilateral subcentimeter renal cysts. No hydronephrosis. Unremarkable urinary bladder containing excreted IV contrast from the prior study. Stomach/Bowel: There is diffuse colonic diverticulosis. No bowel dilatation to suggest obstruction. There is evidence of extravasated IV contrast in the terminal ileum on the early postcontrast images which represents a change from yesterday's examination, consistent with an active GI bleed. Rectosigmoid anastomosis. No bowel dilatation to suggest obstruction. Lymphatic: Aortic atherosclerosis. No enlarged abdominal or pelvic lymph nodes. Reproductive: Status post hysterectomy. No adnexal masses. Other: No abdominal wall hernia or abnormality. No abdominopelvic ascites. Musculoskeletal: Lumbosacral degenerative changes. IMPRESSION: 1. Active GI bleed in the terminal ileum. 2. Diverticulosis. 3. Bilateral nephrolithiasis. 4. Aortic atherosclerosis (ICD10-I70.0). Electronically Signed   By: Sydell Eva M.D.   On: 04/06/2024 11:17   CT ANGIO GI BLEED Result Date: 04/05/2024 CLINICAL DATA:  Lower GI bleed, bright red blood EXAM: CTA ABDOMEN AND PELVIS WITHOUT AND WITH CONTRAST TECHNIQUE: Multidetector CT imaging of the abdomen and pelvis was performed using the standard protocol during bolus administration of intravenous contrast. Multiplanar reconstructed images and MIPs were obtained and reviewed to evaluate the vascular anatomy. RADIATION DOSE REDUCTION: This exam was performed according to the departmental dose-optimization program which includes automated exposure control, adjustment of the mA and/or kV according to patient size and/or use of iterative reconstruction technique. CONTRAST:  75mL OMNIPAQUE  IOHEXOL  350 MG/ML SOLN COMPARISON:  CT abdomen pelvis earlier today FINDINGS: VASCULAR Calcified atherosclerotic plaque in the aorta and its mesenteric, renal, and iliac artery branches without hemodynamically significant stenosis, aneurysm, or  dissection. Review of the MIP images confirms the above findings. NON-VASCULAR Lower chest: No acute abnormality. Hepatobiliary: No acute abnormality. Hypoattenuating lesions in the left hepatic lobe are too small to definitively characterize but likely represent benign cysts. Pancreas: Unremarkable. Spleen: Unremarkable. Adrenals/Urinary Tract: Normal adrenal glands. Nonobstructing by rough lateral nephrolithiasis. No  urinary calculi or hydronephrosis. Unremarkable bladder. Stomach/Bowel: Stomach is within normal limits. No bowel obstruction. Colonic diverticulosis without evidence of diverticulitis. Question mild wall thickening and mucosal hyperenhancement of the ascending, transverse, and descending colon. No adjacent inflammatory stranding or fluid. No evidence of active GI bleeding. Postoperative change about the sigmoid colon. Normal appendix. Lymphatic: No lymphadenopathy. Reproductive: Hysterectomy. Other: No free intraperitoneal fluid or air. Musculoskeletal: No acute fracture. IMPRESSION: 1. Question mild colitis of the ascending, transverse, and descending colon. No evidence of active GI bleeding. 2. Colonic diverticulosis. 3. Aortic Atherosclerosis (ICD10-I70.0). Electronically Signed   By: Rozell Cornet M.D.   On: 04/05/2024 22:23   CT ABDOMEN PELVIS WO CONTRAST Result Date: 04/05/2024 CLINICAL DATA:  Blood in stool. GI bleed with need for transfusion 1 year ago. Abdominal pain. EXAM: CT ABDOMEN AND PELVIS WITHOUT CONTRAST TECHNIQUE: Multidetector CT imaging of the abdomen and pelvis was performed following the standard protocol without IV contrast. RADIATION DOSE REDUCTION: This exam was performed according to the departmental dose-optimization program which includes automated exposure control, adjustment of the mA and/or kV according to patient size and/or use of iterative reconstruction technique. COMPARISON:  CT 07/25/2022 FINDINGS: Lower chest: No acute abnormality. Hepatobiliary:  Unremarkable liver. Normal gallbladder. No biliary dilation. Pancreas: Unremarkable. Spleen: Unremarkable. Adrenals/Urinary Tract: Normal adrenal glands. Bilateral nonobstructing renal calculi. No hydronephrosis. Bladder is unremarkable. Stomach/Bowel: Stomach is within normal limits. Normal caliber large and small bowel. Colonic diverticulosis without diverticulitis. No bowel wall thickening. Normal appendix. Postoperative change about the sigmoid colon. Assessment for active GI bleeding is not possible without IV contrast. Vascular/Lymphatic: Aortic atherosclerosis. No enlarged abdominal or pelvic lymph nodes. Reproductive: Status post hysterectomy. No adnexal masses. Other: No free intraperitoneal fluid or air. Musculoskeletal: No acute fracture. IMPRESSION: 1. No acute abnormality in the abdomen or pelvis. 2. Colonic diverticulosis without diverticulitis. 3. Bilateral nonobstructing renal calculi. 4. Aortic Atherosclerosis (ICD10-I70.0). Electronically Signed   By: Rozell Cornet M.D.   On: 04/05/2024 20:42    Labs:  CBC: Recent Labs    04/05/24 1745 04/05/24 2045 04/06/24 0528 04/06/24 1129  WBC 4.0 6.4 6.0 4.7  HGB 10.5* 8.4* 10.5* 8.2*  HCT 32.9* 26.9* 32.5* 26.0*  PLT 215 193 169 148*    COAGS: No results for input(s): "INR", "APTT" in the last 8760 hours.  BMP: Recent Labs    06/06/23 1110 10/15/23 1805 04/05/24 1745 04/06/24 0528  NA 142 139 139 144  K 4.3 4.1 5.5* 4.4  CL 109 110 110 116*  CO2 25 21* 22 18*  GLUCOSE 145* 85 88 83  BUN 26* 20 31* 22  CALCIUM  9.9 9.3 9.6 8.9  CREATININE 1.50* 1.54* 1.63* 1.18*  GFRNONAA 34* 33* 31* 46*    LIVER FUNCTION TESTS: Recent Labs    06/06/23 1110 04/05/24 1745  BILITOT 0.4 1.0  AST 16 38  ALT 15 23  ALKPHOS 53 51  PROT 7.1 7.6  ALBUMIN 4.3 4.0    TUMOR MARKERS: No results for input(s): "AFPTM", "CEA", "CA199", "CHROMGRNA" in the last 8760 hours.  Assessment and Plan:  GI bleed - CTA showed active bleeding at  terminal ileum.  Images reveiwed by Dr. Nereida Banning.  Will proceed with mesenteric angiography with embolization urgently.  The Risks and benefits of embolization were discussed with the patient including, but not limited to bleeding, infection, vascular injury, post operative pain, or contrast induced renal failure.  This procedure involves the use of X-rays and because of the nature of the planned procedure, it  is possible that we will have prolonged use of X-ray fluoroscopy.  Potential radiation risks to you include (but are not limited to) the following: - A slightly elevated risk for cancer several years later in life. This risk is typically less than 0.5% percent. This risk is low in comparison to the normal incidence of human cancer, which is 33% for women and 50% for men according to the American Cancer Society. - Radiation induced injury can include skin redness, resembling a rash, tissue breakdown / ulcers and hair loss (which can be temporary or permanent).   The likelihood of either of these occurring depends on the difficulty of the procedure and whether you are sensitive to radiation due to previous procedures, disease, or genetic conditions.   IF your procedure requires a prolonged use of radiation, you will be notified and given written instructions for further action.  It is your responsibility to monitor the irradiated area for the 2 weeks following the procedure and to notify your physician if you are concerned that you have suffered a radiation induced injury.    All of the patient's questions were answered, patient is agreeable to proceed. Consent signed and in chart.   Electronically Signed:    I spent a total of 40 Minutes    in face to face in clinical consultation, greater than 50% of which was counseling/coordinating care for embo for GI Bleed.

## 2024-04-06 NOTE — Progress Notes (Signed)
   04/06/24 0243  BiPAP/CPAP/SIPAP  Reason BIPAP/CPAP not in use Non-compliant  BiPAP/CPAP /SiPAP Vitals  Resp 15  MEWS Score/Color  MEWS Score 0  MEWS Score Color Marrie Sizer

## 2024-04-06 NOTE — H&P (Signed)
 History and Physical    Stephanie Barber ZOX:[id number] DOB: [birth year] DOA: 04/05/2024  PCP: Sun, Vyvyan, MD   Patient coming from: Home   Chief Complaint: Rectal bleeding   HPI: Stephanie Barber is a 83 y.o. female with medical history significant for hypertension, hyperlipidemia, diet-controlled diabetes mellitus, OSA, CKD 3B, and diverticulosis who presents with rectal bleeding.  Patient reports that she was in her usual state of health, had something to eat at approximately 11 AM, and then felt as though she was going to have diarrhea roughly 30 minutes later.  She reports passing maroon-colored blood and stool.  She went on to have several more episodes of painless hematochezia.  She denies any abdominal pain, nausea, vomiting, fever, or chills.  ED Course: Upon arrival to the ED, patient is found to be afebrile and saturating well on room air with stable BP.  Labs are most notable for potassium 5.5, creatinine 1.63, normal WBC, and hemoglobin 10.5, dropping to 8.4 after 3 hours.  GI (Dr. Kimble Pennant) was consulted by the ED PA and the patient was given 2 L NS, IV Protonix , and 1 unit RBCs.  Review of Systems:  All other systems reviewed and apart from HPI, are negative.  Past Medical History:  Diagnosis Date   Arthritis    Chronic headache    Complication of anesthesia    allergy to Novocaine    Diabetes mellitus    GERD (gastroesophageal reflux disease)    GI bleed    Heart murmur    High cholesterol    Hyperlipidemia    Hypertension    Intracranial atherosclerosis    per MRA   Migraine    Narcolepsy and cataplexy    OSA    states has not used CPAP in over a year   Peripheral vascular disease (HCC)    Rectal prolapse    Stage 3b chronic kidney disease (CKD) (HCC)    Trigger finger of all digits of right hand     Past Surgical History:  Procedure Laterality Date   ABDOMINAL HYSTERECTOMY     Fibroids   BACK SURGERY     L4,L5 discectomy   BOTOX  INJECTION N/A  01/02/2023   Procedure: BOTOX  INJECTION;  Surgeon: Roxane Copp, MD;  Location: [facility];  Service: Urology;  Laterality: N/A;   CARDIAC CATHETERIZATION     CARPAL TUNNEL RELEASE Right 01/18/2017   COLONOSCOPY N/A 07/28/2022   Procedure: COLONOSCOPY;  Surgeon: Ozell Blunt, MD;  Location: [facility];  Service: Gastroenterology;  Laterality: N/A;   COLONOSCOPY WITH PROPOFOL  N/A 06/17/2021   Procedure: COLONOSCOPY WITH PROPOFOL ;  Surgeon: Baldo Bonds, MD;  Location: [facility];  Service: Endoscopy;  Laterality: N/A;   CYSTOSCOPY WITH INJECTION N/A 01/02/2023   Procedure: CYSTOSCOPY WITH [place];  Surgeon: Roxane Copp, MD;  Location: [facility];  Service: Urology;  Laterality: N/A;  45 MINS   ESOPHAGOGASTRODUODENOSCOPY (EGD) WITH PROPOFOL  N/A 06/17/2021   Procedure: ESOPHAGOGASTRODUODENOSCOPY (EGD) WITH PROPOFOL ;  Surgeon: Baldo Bonds, MD;  Location: [facility];  Service: Endoscopy;  Laterality: N/A;   KIDNEY SURGERY  1998   Right.  growth removed   POLYPECTOMY  06/17/2021   Procedure: POLYPECTOMY;  Surgeon: Baldo Bonds, MD;  Location: [facility];  Service: Endoscopy;;   POLYPECTOMY  07/28/2022   Procedure: POLYPECTOMY;  Surgeon: Ozell Blunt, MD;  Location: [facility];  Service: Gastroenterology;;   rectal prolapse repair  12/05/2011   ROTATOR CUFF REPAIR     Left   SHOULDER ARTHROSCOPY  WITH SUBACROMIAL DECOMPRESSION, ROTATOR CUFF REPAIR AND BICEP TENDON REPAIR Left 05/25/2015   Procedure: SHOULDER ARTHROSCOPY WITH SUBACROMIAL DECOMPRESSION, ROTATOR CUFF REPAIR AND BICEP TENODESIS, DEBRIDEMENT. ;  Surgeon: Jasmine Mesi, MD;  Location: [facility];  Service: Orthopedics;  Laterality: Left;  LEFT SHOULDER ROTATOR CUFF TEAR REPAIR, ARTHROSCOPY, DEBRIDEMENT, BICEPS TENODESIS, SUBACROMIAL DECOMPRESSION.   TOTAL ABDOMINAL HYSTERECTOMY     TRIGGER FINGER RELEASE Right 04/30/2018   Procedure: RELEASE TRIGGER FINGER/A-1 PULLEY RIGHT MIDDLE  AND SMALLn ;  Surgeon: Lyanne Sample, MD;  Location: [facility];  Service: Orthopedics;  Laterality: Right;  FAB, Bier block   TUBAL LIGATION      Social History:   reports that she quit smoking about 43 years ago. Her smoking use included cigarettes. She has never used smokeless tobacco. She reports that she does not drink alcohol  and does not use drugs.  Allergies  Allergen Reactions   Morphine And Codeine Anaphylaxis   Lactose Intolerance (Gi) Diarrhea and Nausea Only    Family History  Problem Relation Age of Onset   Heart disease Mother    Throat cancer Father    Cancer Father        stomach   Kidney cancer Brother    Heart disease Brother      Prior to Admission medications   Medication Sig Start Date End Date Taking? Authorizing Provider  acetaminophen  (TYLENOL ) 500 MG tablet Take 1,000 mg by mouth every 6 (six) hours as needed for moderate pain.    [provider]  acetaminophen  (TYLENOL ) 650 MG CR tablet Take 1,300 mg by mouth every 8 (eight) hours as needed for pain.    [provider]  amLODipine  (NORVASC ) 10 MG tablet Take 10 mg by mouth in the morning.    [provider]  Apoaequorin (PREVAGEN PO) Take 1 capsule by mouth daily.    [provider]  carboxymethylcellulose (REFRESH PLUS) 0.5 % SOLN Place 1 drop into both eyes daily as needed (dry/irritated eyes).    [provider]  cyanocobalamin  (VITAMIN B12) 1000 MCG tablet Take 1,000 mcg by mouth daily.    [provider]  fidaxomicin  (DIFICID ) 200 MG TABS tablet Take 1 tablet (200 mg total) by mouth 2 (two) times daily. 10/15/23   Debbra Fairy, PA-C  gabapentin  (NEURONTIN ) 100 MG capsule Take 1 capsule (100 mg total) by mouth 2 (two) times daily. 08/25/23   Jasmine Mesi, MD  Homeopathic Products [facility] RELIEF EX) Apply 1 Application topically daily as needed (pain).    [provider]  lipase/protease/amylase (CREON ) 36000 UNITS CPEP capsule Take 36,000-72,000  Units by mouth See admin instructions. Take 72000 units with meals and 36000 units with snacks    [provider]  Menthol, Topical Analgesic, (BIOFREEZE EX) Apply 1 Application topically daily as needed (pain).    [provider]  metoprolol  succinate (TOPROL -XL) 50 MG 24 hr tablet Take 50 mg by mouth in the morning.    [provider]  mirtazapine  (REMERON ) 15 MG tablet Take 1 tablet (15 mg total) by mouth at bedtime. 11/28/23   Mozingo, Regina Nattalie, NP  pantoprazole  (PROTONIX ) 40 MG tablet Take 1 tablet (40 mg total) by mouth daily. 07/29/22 12/20/23  Vada Garibaldi, MD  rosuvastatin  (CRESTOR ) 40 MG tablet Take 1 tablet (40 mg total) by mouth at bedtime. 12/20/23   Tolia, Sunit, DO  TURMERIC PO Take 1 capsule by mouth daily.    [provider]    Physical Exam:  Vitals:   04/05/24 2100 04/05/24 2223 04/05/24 2239 04/06/24 0000  BP: (!) 170/67 (!) 158/67 (!) 163/63 (!) 158/96  Pulse: (!) 59 74 60 63  Resp: 14 14 14 20   Temp:  97.8 F (36.6 C) 97.6 F (36.4 C) 98.3 F (36.8 C)  TempSrc:  Oral Oral Oral  SpO2: 100% 100% 100% 100%    Constitutional: NAD, no pallor or diaphoresis  Eyes: PERTLA, lids and conjunctivae normal ENMT: Mucous membranes are moist. Posterior pharynx clear of any exudate or lesions.   Neck: supple, no masses  Respiratory: no wheezing, no crackles. No accessory muscle use.  Cardiovascular: S1 & S2 heard, regular rate and rhythm. No extremity edema.   Abdomen: No distension, no tenderness, soft. Bowel sounds active.  Musculoskeletal: no clubbing / cyanosis. No joint deformity upper and lower extremities.   Skin: no significant rashes, lesions, ulcers. Warm, dry, well-perfused. Neurologic: CN 2-12 grossly intact. Moving all extremities. Alert and oriented.  Psychiatric: Calm. Cooperative.    Labs and Imaging on Admission: I have personally reviewed following labs and imaging studies  CBC: Recent Labs  Lab 04/05/24 1745  04/05/24 2045  WBC 4.0 6.4  NEUTROABS 2.2  --   HGB 10.5* 8.4*  HCT 32.9* 26.9*  MCV 96.5 97.8  PLT 215 193   Basic Metabolic Panel: Recent Labs  Lab 04/05/24 1745  NA 139  K 5.5*  CL 110  CO2 22  GLUCOSE 88  BUN 31*  CREATININE 1.63*  CALCIUM  9.6   GFR: CrCl cannot be calculated (Unknown ideal weight.). Liver Function Tests: Recent Labs  Lab 04/05/24 1745  AST 38  ALT 23  ALKPHOS 51  BILITOT 1.0  PROT 7.6  ALBUMIN 4.0   Recent Labs  Lab 04/05/24 1745  LIPASE 49   No results for input(s): "AMMONIA" in the last 168 hours. Coagulation Profile: No results for input(s): "INR", "PROTIME" in the last 168 hours. Cardiac Enzymes: No results for input(s): "CKTOTAL", "CKMB", "CKMBINDEX", "TROPONINI" in the last 168 hours. BNP (last 3 results) No results for input(s): "PROBNP" in the last 8760 hours. HbA1C: No results for input(s): "HGBA1C" in the last 72 hours. CBG: Recent Labs  Lab 04/05/24 2032  GLUCAP 105*   Lipid Profile: No results for input(s): "CHOL", "HDL", "LDLCALC", "TRIG", "CHOLHDL", "LDLDIRECT" in the last 72 hours. Thyroid  Function Tests: No results for input(s): "TSH", "T4TOTAL", "FREET4", "T3FREE", "THYROIDAB" in the last 72 hours. Anemia Panel: No results for input(s): "VITAMINB12", "FOLATE", "FERRITIN", "TIBC", "IRON ", "RETICCTPCT" in the last 72 hours. Urine analysis:    Component Value Date/Time   COLORURINE STRAW (A) 09/28/2022 0905   APPEARANCEUR CLEAR 09/28/2022 0905   LABSPEC 1.013 09/28/2022 0905   PHURINE 5.0 09/28/2022 0905   GLUCOSEU NEGATIVE 09/28/2022 0905   HGBUR NEGATIVE 09/28/2022 0905   BILIRUBINUR NEGATIVE 09/28/2022 0905   KETONESUR NEGATIVE 09/28/2022 0905   PROTEINUR NEGATIVE 09/28/2022 0905   UROBILINOGEN 0.2 05/20/2009 1405   NITRITE NEGATIVE 09/28/2022 0905   LEUKOCYTESUR NEGATIVE 09/28/2022 0905   Sepsis Labs: @LABRCNTIP (procalcitonin:4,lacticidven:4) )No results found for this or any previous visit (from  the past 240 hours).   Radiological Exams on Admission: CT ANGIO GI BLEED Result Date: 04/05/2024 CLINICAL DATA:  Lower GI bleed, bright red blood EXAM: CTA ABDOMEN AND PELVIS WITHOUT AND WITH CONTRAST TECHNIQUE: Multidetector CT imaging of the abdomen and pelvis was performed using the standard protocol during bolus administration of intravenous contrast. Multiplanar reconstructed images and MIPs were obtained and reviewed to evaluate the vascular  anatomy. RADIATION DOSE REDUCTION: This exam was performed according to the departmental dose-optimization program which includes automated exposure control, adjustment of the mA and/or kV according to patient size and/or use of iterative reconstruction technique. CONTRAST:  75mL OMNIPAQUE  IOHEXOL  350 MG/ML SOLN COMPARISON:  CT abdomen pelvis earlier today FINDINGS: VASCULAR Calcified atherosclerotic plaque in the aorta and its mesenteric, renal, and iliac artery branches without hemodynamically significant stenosis, aneurysm, or dissection. Review of the MIP images confirms the above findings. NON-VASCULAR Lower chest: No acute abnormality. Hepatobiliary: No acute abnormality. Hypoattenuating lesions in the left hepatic lobe are too small to definitively characterize but likely represent benign cysts. Pancreas: Unremarkable. Spleen: Unremarkable. Adrenals/Urinary Tract: Normal adrenal glands. Nonobstructing by rough lateral nephrolithiasis. No urinary calculi or hydronephrosis. Unremarkable bladder. Stomach/Bowel: Stomach is within normal limits. No bowel obstruction. Colonic diverticulosis without evidence of diverticulitis. Question mild wall thickening and mucosal hyperenhancement of the ascending, transverse, and descending colon. No adjacent inflammatory stranding or fluid. No evidence of active GI bleeding. Postoperative change about the sigmoid colon. Normal appendix. Lymphatic: No lymphadenopathy. Reproductive: Hysterectomy. Other: No free intraperitoneal  fluid or air. Musculoskeletal: No acute fracture. IMPRESSION: 1. Question mild colitis of the ascending, transverse, and descending colon. No evidence of active GI bleeding. 2. Colonic diverticulosis. 3. Aortic Atherosclerosis (ICD10-I70.0). Electronically Signed   By: Rozell Cornet M.D.   On: 04/05/2024 22:23   CT ABDOMEN PELVIS WO CONTRAST Result Date: 04/05/2024 CLINICAL DATA:  Blood in stool. GI bleed with need for transfusion 1 year ago. Abdominal pain. EXAM: CT ABDOMEN AND PELVIS WITHOUT CONTRAST TECHNIQUE: Multidetector CT imaging of the abdomen and pelvis was performed following the standard protocol without IV contrast. RADIATION DOSE REDUCTION: This exam was performed according to the departmental dose-optimization program which includes automated exposure control, adjustment of the mA and/or kV according to patient size and/or use of iterative reconstruction technique. COMPARISON:  CT 07/25/2022 FINDINGS: Lower chest: No acute abnormality. Hepatobiliary: Unremarkable liver. Normal gallbladder. No biliary dilation. Pancreas: Unremarkable. Spleen: Unremarkable. Adrenals/Urinary Tract: Normal adrenal glands. Bilateral nonobstructing renal calculi. No hydronephrosis. Bladder is unremarkable. Stomach/Bowel: Stomach is within normal limits. Normal caliber large and small bowel. Colonic diverticulosis without diverticulitis. No bowel wall thickening. Normal appendix. Postoperative change about the sigmoid colon. Assessment for active GI bleeding is not possible without IV contrast. Vascular/Lymphatic: Aortic atherosclerosis. No enlarged abdominal or pelvic lymph nodes. Reproductive: Status post hysterectomy. No adnexal masses. Other: No free intraperitoneal fluid or air. Musculoskeletal: No acute fracture. IMPRESSION: 1. No acute abnormality in the abdomen or pelvis. 2. Colonic diverticulosis without diverticulitis. 3. Bilateral nonobstructing renal calculi. 4. Aortic Atherosclerosis (ICD10-I70.0).  Electronically Signed   By: Rozell Cornet M.D.   On: 04/05/2024 20:42    Assessment/Plan   1. Acute GI bleeding; acute blood-loss anemia   - History most suggestive of diverticular bleeding; no active bleeding noted on CTA; given 1 unit RBC in ED  - Continue bowel-rest, follow serial CBCs, transfuse additional RBC if needed    2. CKD 3B  - Appears close to baseline  - Renally-dose medications    3. Hypertension  - Treat as-needed only for now     DVT prophylaxis: SCDs  Code Status: Full  Level of Care: Level of care: Progressive Family Communication: Husband at bedside  Disposition Plan:  Patient is from: Home  Anticipated d/c is to: Home  Anticipated d/c date is: 04/08/24  Patient currently: Pending stable H&H  Consults called: GI  Admission status: Inpatient  Walton Guppy, MD [facility]  04/06/2024, 12:05 AM

## 2024-04-06 NOTE — Procedures (Signed)
 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 Able to advance microcatheter to level of branch and embolization performed with two separate 2 mm coils, resulting in occlusion and no further evidence of opacification of the pseudoaneurysm or bleeding.  Right CFA access with 5 Fr sheath. Closure: AngioSeal  Eternity Dexter T. Nereida Banning, M.D Pager:  [phone]

## 2024-04-07 DIAGNOSIS — K922 Gastrointestinal hemorrhage, unspecified: Secondary | ICD-10-CM | POA: Diagnosis not present

## 2024-04-07 DIAGNOSIS — I10 Essential (primary) hypertension: Secondary | ICD-10-CM | POA: Diagnosis not present

## 2024-04-07 LAB — CBC
HCT: 35.6 % — ABNORMAL LOW (ref 36.0–46.0)
HCT: 35.9 % — ABNORMAL LOW (ref 36.0–46.0)
Hemoglobin: 11.3 g/dL — ABNORMAL LOW (ref 12.0–15.0)
Hemoglobin: 11.9 g/dL — ABNORMAL LOW (ref 12.0–15.0)
MCH: 31.6 pg (ref 26.0–34.0)
MCH: 31.6 pg (ref 26.0–34.0)
MCHC: 31.7 g/dL (ref 30.0–36.0)
MCHC: 33.1 g/dL (ref 30.0–36.0)
MCV: 95.5 fL (ref 80.0–100.0)
MCV: 99.4 fL (ref 80.0–100.0)
Platelets: 104 10*3/uL — ABNORMAL LOW (ref 150–400)
Platelets: 123 10*3/uL — ABNORMAL LOW (ref 150–400)
RBC: 3.58 MIL/uL — ABNORMAL LOW (ref 3.87–5.11)
RBC: 3.76 MIL/uL — ABNORMAL LOW (ref 3.87–5.11)
RDW: 14 % (ref 11.5–15.5)
RDW: 14.2 % (ref 11.5–15.5)
WBC: 6.7 10*3/uL (ref 4.0–10.5)
WBC: 7.3 10*3/uL (ref 4.0–10.5)
nRBC: 0 % (ref 0.0–0.2)
nRBC: 0 % (ref 0.0–0.2)

## 2024-04-07 LAB — BASIC METABOLIC PANEL WITH GFR
Anion gap: 5 (ref 5–15)
BUN: 15 mg/dL (ref 8–23)
CO2: 19 mmol/L — ABNORMAL LOW (ref 22–32)
Calcium: 7.9 mg/dL — ABNORMAL LOW (ref 8.9–10.3)
Chloride: 116 mmol/L — ABNORMAL HIGH (ref 98–111)
Creatinine, Ser: 1.12 mg/dL — ABNORMAL HIGH (ref 0.44–1.00)
GFR, Estimated: 48 mL/min — ABNORMAL LOW (ref >60)
Glucose, Bld: 72 mg/dL (ref 70–99)
Potassium: 3.5 mmol/L (ref 3.5–5.1)
Sodium: 140 mmol/L (ref 135–145)

## 2024-04-07 LAB — BPAM RBC
Blood Product Expiration Date: 202506242359
Blood Product Expiration Date: 202506242359
Blood Product Expiration Date: 202506292359
ISSUE DATE / TIME: 202505251208
ISSUE DATE / TIME: 202505251618
ISSUE DATE / TIME: 202505242214
Unit Type and Rh: 5100
Unit Type and Rh: 5100
Unit Type and Rh: 5100

## 2024-04-07 LAB — TYPE AND SCREEN
ABO/RH(D): O POS
Antibody Screen: NEGATIVE
Unit division: 0
Unit division: 0
Unit division: 0

## 2024-04-07 MED ORDER — BOOST / RESOURCE BREEZE PO LIQD CUSTOM
1.0000 | Freq: Three times a day (TID) | ORAL | Status: DC
  Administered 2024-04-07 – 2024-04-08 (×4): 1 via ORAL

## 2024-04-07 MED ORDER — URELLE 81 MG PO TABS
1.0000 | ORAL_TABLET | Freq: Four times a day (QID) | ORAL | Status: DC
  Administered 2024-04-07 – 2024-04-08 (×5): 81 mg via ORAL
  Filled 2024-04-07 (×6): qty 1

## 2024-04-07 MED ORDER — AMLODIPINE BESYLATE 5 MG PO TABS
5.0000 mg | ORAL_TABLET | Freq: Every day | ORAL | Status: DC
  Administered 2024-04-07: 5 mg via ORAL
  Filled 2024-04-07: qty 1

## 2024-04-07 NOTE — Progress Notes (Signed)
 [facility]                                                                              Stephanie Barber, is a 83 y.o. female, DOB - 1941/01/23, ZOX:[id number] Admit date - 04/05/2024    Outpatient Primary MD for the patient is Sun, Vyvyan, MD  LOS - 2  days  Chief Complaint  Patient presents with   GI Bleeding       Brief summary   Patient is a 83 year old female with HTN, hyperlipidemia, diet-controlled diabetes mellitus, OSA, CKD stage IIIb, diverticulosis presented with rectal bleeding.  Patient reported that she was in her usual state of health, had something to eat at approximately 11 AM.  30 minutes later she reports passing maroon-colored blood in the stool she went on to have several more episodes of painless hematochezia.  No abdominal pain nausea vomiting hematemesis fevers or chills. Hemoglobin 10.5 on arrival, subsequently dropped to 8.4 and was transfused 1 unit packed RBCs Patient has a history of known diverticulosis, admitted for further workup CTA GI bleed on admission was negative  Assessment & Plan    Principal Problem:   Acute lower GI bleeding, acute blood loss anemia in the setting of known diverticular disease -  presented with active GI bleeding with multiple episodes of rectal bleeding.  Initial CTA negative.   -Previous colonoscopy 07/28/2022 showed diverticulosis in the sigmoid colon, descending colon, transverse colon, ascending colon and rectum.  Patent end-to-side colocolonic anastomosis.  EGD 06/17/2021 normal -Due to persistent GI bleeding, CTA was repeated on 5/25, + for active bleeding.  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   Weight as of this encounter: 50.2 kg.  Code Status: Full code DVT Prophylaxis:  SCDs Start: 04/06/24 0004   Level of Care: Level of care: Stepdown Family Communication: Patient alert and oriented, no family at the bedside Disposition Plan:      Remains inpatient appropriate: Pending GI evaluation   Procedures:  CTA GI bleed protocol  5/25 SMA arteriography, multiple selective branch vessel arteriography and transcatheter embolization of distal SMA branch supplying distal ileum   Consultants:   Gastroenterology IR  Antimicrobials:   Anti-infectives (From admission, onward)    Start     Dose/Rate Route Frequency Ordered Stop   04/07/24 1000  Urelle (URELLE/URISED) 81 MG tablet 81 mg        1 tablet Oral 4 times daily 04/07/24 0734  04/09/24 0959          Medications  amLODipine   5 mg Oral Daily   Chlorhexidine  Gluconate Cloth  6 each Topical Daily   feeding supplement  1 Container Oral TID BM   metoprolol  succinate  50 mg Oral Daily   pantoprazole  (PROTONIX ) IV  40 mg Intravenous Daily   sodium chloride  flush  3 mL Intravenous Q12H   Urelle  1 tablet Oral QID      Subjective:   Stephanie Barber was seen and examined today.  States had bloody BM after the procedure however could be residual blood.  No nausea vomiting abdominal pain or bleeding this morning.  No other acute issues  Objective:   Vitals:   04/07/24 0800 04/07/24 0900 04/07/24 0935 04/07/24 1000  BP: (!) 158/64 (!) 174/66 (!) 174/66 131/70  Pulse: 61 65  70  Resp: 16 19  14    Temp: 97.9 F (36.6 C)     TempSrc: Oral     SpO2: 98% 98%  95%  Weight:      Height:        Intake/Output Summary (Last 24 hours) at 04/07/2024 1105 Last data filed at 04/07/2024 0939 Gross per 24 hour  Intake 2071.44 ml  Output 500 ml  Net 1571.44 ml     Wt Readings from Last 3 Encounters:  04/06/24 50.2 kg  01/11/24 50.7 kg  12/20/23 52.2 kg   Physical Exam General: Alert and oriented x 3, NAD Cardiovascular: S1 S2 clear, RRR.  Respiratory: CTAB Gastrointestinal: Soft, nontender, nondistended, NBS Ext: no pedal edema bilaterally Neuro: no new deficits Psych: Normal affect      Data Reviewed:  I have personally reviewed following labs    CBC Lab Results  Component Value Date   WBC 7.3 04/07/2024   RBC 3.76 (L) 04/07/2024   HGB 11.9 (L) 04/07/2024   HCT 35.9 (L) 04/07/2024   MCV 95.5 04/07/2024   MCH 31.6 04/07/2024   PLT 123 (L) 04/07/2024   MCHC 33.1 04/07/2024   RDW 14.0 04/07/2024   LYMPHSABS 1.1 04/05/2024   MONOABS 0.4 04/05/2024   EOSABS 0.1 04/05/2024   BASOSABS 0.0 04/05/2024     Last metabolic panel Lab Results  Component Value Date   NA 140 04/07/2024   K 3.5 04/07/2024   CL 116 (H) 04/07/2024   CO2 19 (L) 04/07/2024   BUN 15 04/07/2024   CREATININE 1.12 (H) 04/07/2024   GLUCOSE 72 04/07/2024   GFRNONAA 48 (L) 04/07/2024   GFRAA 50 (L) 06/03/2019   CALCIUM  7.9 (L) 04/07/2024   PHOS 3.4 05/06/2017   PROT 7.6 04/05/2024   ALBUMIN 4.0 04/05/2024   BILITOT 1.0 04/05/2024   ALKPHOS 51 04/05/2024   AST 38 04/05/2024   ALT 23 04/05/2024   ANIONGAP 5 04/07/2024    CBG (last 3)  Recent Labs    04/05/24 2032  GLUCAP 105*      Coagulation Profile: Recent Labs  Lab 04/06/24 1546  INR 1.2     Radiology Studies: I have personally reviewed the imaging studies  IR EMBO ART  VEN HEMORR LYMPH EXTRAV  INC GUIDE ROADMAPPING Result Date: 04/06/2024 INDICATION: Active GI bleed clinically with CTA today demonstrating active  arterial bleed at the level of the distal terminal ileum. EXAM: 1. ULTRASOUND GUIDANCE FOR VASCULAR ACCESS OF THE RIGHT COMMON FEMORAL ARTERY 2. SELECTIVE ARTERIOGRAPHY OF THE SUPERIOR MESENTERIC ARTERY 3. ADDITIONAL SELECTIVE ARTERIOGRAPHY OF SECOND ORDER SUPERIOR MESENTERIC ARTERY BRANCH 4. ADDITIONAL SELECTIVE  ARTERIOGRAPHY OF SECOND ORDER SUPERIOR MESENTERIC ARTERY BRANCH 5. ADDITIONAL SECOND ORDER ARTERIOGRAPHY OF THIRD ORDER SUPERIOR MESENTERIC ARTERY BRANCH 6. ADDITIONAL SELECTIVE ARTERIOGRAPHY OF FOURTH ORDER SUPERIOR MESENTERIC ARTERY BRANCH 7. ADDITIONAL SELECTIVE ARTERIOGRAPHY OF FIFTH ORDER ILEOCOLIC SUPERIOR MESENTERIC ARTERY BRANCH 8. ADDITIONAL SELECTIVE ARTERIOGRAPHY OF SEPARATE FIFTH ORDER ILEOCOLIC SUPERIOR MESENTERIC ARTERY BRANCH 9. TRANSCATHETER ARTERIOGRAPHY OF ILEOCOLIC SUPERIOR MESENTERIC ARTERY BRANCH MEDICATIONS: During the procedure, 100 mcg of intra-arterial nitroglycerin  was administered in a superior mesenteric artery branch. ANESTHESIA/SEDATION: Moderate (conscious) sedation was employed during this procedure. A total of Versed  2.0 mg and 12.5 mg of IV Benadryl  was administered intravenously. Moderate Sedation Time: 60 minutes. The patient's level of consciousness and vital signs were monitored continuously by radiology nursing throughout the procedure under my direct supervision. CONTRAST:  62 mL Visipaque 320 FLUOROSCOPY TIME:  Radiation Exposure Index (as provided by the fluoroscopic device): 457 mGy Kerma COMPLICATIONS: None immediate. PROCEDURE: Informed consent was obtained from the patient following explanation of the procedure, risks, benefits and alternatives. The patient understands, agrees and consents for the procedure. All questions were addressed. A time out was performed prior to the initiation of the procedure. Maximal barrier sterile technique utilized including caps, mask, sterile gowns, sterile gloves, large sterile drape, hand hygiene, and chlorhexidine  prep.  Ultrasound was used to confirm patency of the right common femoral artery. An ultrasound image was saved and recorded. Under ultrasound guidance, access of the right common femoral artery was performed with a micropuncture set. A 5 French sheath was placed over a guidewire. A 5 French Cobra catheter was advanced over a wire into the abdominal aorta. This was used to selectively catheterize the superior mesenteric artery. The catheter was further advanced into the proximal trunk of the SMA over a guidewire. Selective arteriography of the SMA was performed. A 2.4 French microcatheter was then advanced through the 5 [place] catheter and initially used to selectively catheterize a second order branch of the superior mesenteric artery. The catheter was retracted and utilized in catheterizing an additional second order branch and selective arteriography performed. The catheter was then used to selectively catheterize a third order branch supplying distal small bowel and proximal colon. Selective arteriography was performed. The catheter was then used to selectively catheterized a fourth order ileocolic branch supplying the cecum and distal ileum and additional selective arteriography performed. Intra-arterial nitroglycerin  was administered into ileocolic supply. The microcatheter was then used to selectively catheterize a fifth order ileocolic branch and selective arteriography performed. The catheter was retracted and used to selectively catheterize an additional fifth order ileocolic branch and selective arteriography performed. Transcatheter embolization was then performed with advancement and detachment of 2 separate 2 mm x 4 cm length 0.018 inch embolization coils through the microcatheter. Additional arteriography was performed through the microcatheter. Oblique arteriography was performed at them femoral puncture site through the access sheath. Arteriotomy closure was then performed with the Angio-Seal device.  FINDINGS: Initial proximal SMA arteriography did not reveal active bleeding in the distribution of the SMA and in particular in the region of demonstrated bleeding by CTA at the level of the terminal ileum. Multiple selective catheterizations were then performed of higher order branches supplying the ileocolic distribution via a coaxial microcatheter based on location of the bleed by CTA and correlating arterial anatomy with the CTA performed earlier today. Ultimately, at the level of fifth order ileocolic branches, a rounded arterial pseudoaneurysm was identified emanating from a distal branch vessel that ultimately showed further extravasation into the lumen of the terminal  ileum near the expected position of the ileocecal valve. Coil embolization was able to be performed of the branch at the base of the pseudoaneurysm and extending back to ileocolic supply without sacrificing significant perfusion of the cecum or terminal ileum. Completion arteriography does not demonstrate further contrast filling of the pseudoaneurysm or extravasation of contrast into the lumen of the terminal ileum. 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 This correlates exactly with the region of bleeding seen by CTA. Superselective transcatheter embolization was able to be performed via a microcatheter at the base of the bleeding pseudoaneurysm with embolization coils resulting in successful occlusion and no further visualized flow within the pseudoaneurysm or bleeding into the terminal ileum. Electronically Signed   By: Erica Hau M.D.   On: 04/06/2024 16:18   IR US  Guide Vasc Access Right Result Date: 04/06/2024 INDICATION: Active GI bleed clinically with CTA today demonstrating active arterial bleed at the level of the distal terminal  ileum. EXAM: 1. ULTRASOUND GUIDANCE FOR VASCULAR ACCESS OF THE RIGHT COMMON FEMORAL ARTERY 2. SELECTIVE ARTERIOGRAPHY OF THE SUPERIOR MESENTERIC ARTERY 3. ADDITIONAL SELECTIVE ARTERIOGRAPHY OF SECOND ORDER SUPERIOR MESENTERIC ARTERY BRANCH 4. ADDITIONAL SELECTIVE ARTERIOGRAPHY OF SECOND ORDER SUPERIOR MESENTERIC ARTERY BRANCH 5. ADDITIONAL SECOND ORDER ARTERIOGRAPHY OF THIRD ORDER SUPERIOR MESENTERIC ARTERY BRANCH 6. ADDITIONAL SELECTIVE ARTERIOGRAPHY OF FOURTH ORDER SUPERIOR MESENTERIC ARTERY BRANCH 7. ADDITIONAL SELECTIVE ARTERIOGRAPHY OF FIFTH ORDER ILEOCOLIC SUPERIOR MESENTERIC ARTERY BRANCH 8. ADDITIONAL SELECTIVE ARTERIOGRAPHY OF SEPARATE FIFTH ORDER ILEOCOLIC SUPERIOR MESENTERIC ARTERY BRANCH 9. TRANSCATHETER ARTERIOGRAPHY OF ILEOCOLIC SUPERIOR MESENTERIC ARTERY BRANCH MEDICATIONS: During the procedure, 100 mcg of intra-arterial nitroglycerin  was administered in a superior mesenteric artery branch. ANESTHESIA/SEDATION: Moderate (conscious) sedation was employed during this procedure. A total of Versed  2.0 mg and 12.5 mg of IV Benadryl  was administered intravenously. Moderate Sedation Time: 60 minutes. The patient's level of consciousness and vital signs were monitored continuously by radiology nursing throughout the procedure under my direct supervision. CONTRAST:  62 mL Visipaque 320 FLUOROSCOPY TIME:  Radiation Exposure Index (as provided by the fluoroscopic device): 457 mGy Kerma COMPLICATIONS: None immediate. PROCEDURE: Informed consent was obtained from the patient following explanation of the procedure, risks, benefits and alternatives. The patient understands, agrees and consents for the procedure. All questions were addressed. A time out was performed prior to the initiation of the procedure. Maximal barrier sterile technique utilized including caps, mask, sterile gowns, sterile gloves, large sterile drape, hand hygiene, and chlorhexidine  prep. Ultrasound was used to confirm patency of the right  common femoral artery. An ultrasound image was saved and recorded. Under ultrasound guidance, access of the right common femoral artery was performed with a micropuncture set. A 5 French sheath was placed over a guidewire. A 5 French Cobra catheter was advanced over a wire into the abdominal aorta. This was used to selectively catheterize the superior mesenteric artery. The catheter was further advanced into the proximal trunk of the SMA over a guidewire. Selective arteriography of the SMA was performed. A 2.4 French microcatheter was then advanced through the 5 [place] catheter and initially used to selectively catheterize a second order branch of the superior mesenteric artery. The catheter was retracted and utilized in catheterizing an additional second order branch and selective arteriography performed. The catheter was then used to selectively catheterize a third order branch supplying distal small bowel and proximal colon. Selective arteriography  was performed. The catheter was then used to selectively catheterized a fourth order ileocolic branch supplying the cecum and distal ileum and additional selective arteriography performed. Intra-arterial nitroglycerin  was administered into ileocolic supply. The microcatheter was then used to selectively catheterize a fifth order ileocolic branch and selective arteriography performed. The catheter was retracted and used to selectively catheterize an additional fifth order ileocolic branch and selective arteriography performed. Transcatheter embolization was then performed with advancement and detachment of 2 separate 2 mm x 4 cm length 0.018 inch embolization coils through the microcatheter. Additional arteriography was performed through the microcatheter. Oblique arteriography was performed at them femoral puncture site through the access sheath. Arteriotomy closure was then performed with the Angio-Seal device. FINDINGS: Initial proximal SMA arteriography did not  reveal active bleeding in the distribution of the SMA and in particular in the region of demonstrated bleeding by CTA at the level of the terminal ileum. Multiple selective catheterizations were then performed of higher order branches supplying the ileocolic distribution via a coaxial microcatheter based on location of the bleed by CTA and correlating arterial anatomy with the CTA performed earlier today. 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 Coil embolization was able to be performed of the branch at the base of the pseudoaneurysm and extending back to ileocolic supply without sacrificing significant perfusion of the cecum or terminal ileum. Completion arteriography does not demonstrate further contrast filling of the pseudoaneurysm or extravasation of contrast into the lumen of the terminal ileum. 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 This correlates exactly with the region of bleeding seen by CTA. Superselective transcatheter embolization was able to be performed via a microcatheter at the base of the bleeding pseudoaneurysm with embolization coils resulting in successful occlusion and no further visualized flow within the pseudoaneurysm or bleeding into the terminal ileum. Electronically Signed   By: Erica Hau M.D.   On: 04/06/2024 16:18   IR Angiogram Visceral Selective Result Date: 04/06/2024 INDICATION: Active GI bleed clinically with CTA today demonstrating active arterial bleed at the level of the distal terminal ileum. EXAM: 1. ULTRASOUND GUIDANCE FOR VASCULAR  ACCESS OF THE RIGHT COMMON FEMORAL ARTERY 2. SELECTIVE ARTERIOGRAPHY OF THE SUPERIOR MESENTERIC ARTERY 3. ADDITIONAL SELECTIVE ARTERIOGRAPHY OF SECOND ORDER SUPERIOR MESENTERIC ARTERY BRANCH 4. ADDITIONAL SELECTIVE ARTERIOGRAPHY OF SECOND ORDER SUPERIOR MESENTERIC ARTERY BRANCH 5. ADDITIONAL SECOND ORDER ARTERIOGRAPHY OF THIRD ORDER SUPERIOR MESENTERIC ARTERY BRANCH 6. ADDITIONAL SELECTIVE ARTERIOGRAPHY OF FOURTH ORDER SUPERIOR MESENTERIC ARTERY BRANCH 7. ADDITIONAL SELECTIVE ARTERIOGRAPHY OF FIFTH ORDER ILEOCOLIC SUPERIOR MESENTERIC ARTERY BRANCH 8. ADDITIONAL SELECTIVE ARTERIOGRAPHY OF SEPARATE FIFTH ORDER ILEOCOLIC SUPERIOR MESENTERIC ARTERY BRANCH 9. TRANSCATHETER ARTERIOGRAPHY OF ILEOCOLIC SUPERIOR MESENTERIC ARTERY BRANCH MEDICATIONS: During the procedure, 100 mcg of intra-arterial nitroglycerin  was administered in a superior mesenteric artery branch. ANESTHESIA/SEDATION: Moderate (conscious) sedation was employed during this procedure. A total of Versed  2.0 mg and 12.5 mg of IV Benadryl  was administered intravenously. Moderate Sedation Time: 60 minutes. The patient's level of consciousness and vital signs were monitored continuously by radiology nursing throughout the procedure under my direct supervision. CONTRAST:  62 mL Visipaque 320 FLUOROSCOPY TIME:  Radiation Exposure Index (as provided by the fluoroscopic device):  457 mGy Kerma COMPLICATIONS: None immediate. PROCEDURE: Informed consent was obtained from the patient following explanation of the procedure, risks, benefits and alternatives. The patient understands, agrees and consents for the procedure. All questions were addressed. A time out was performed prior to the initiation of the procedure. Maximal barrier sterile technique utilized including caps, mask, sterile gowns, sterile gloves, large sterile drape, hand hygiene, and chlorhexidine  prep. Ultrasound was used to confirm patency of the right common femoral artery. An ultrasound image was saved  and recorded. Under ultrasound guidance, access of the right common femoral artery was performed with a micropuncture set. A 5 French sheath was placed over a guidewire. A 5 French Cobra catheter was advanced over a wire into the abdominal aorta. This was used to selectively catheterize the superior mesenteric artery. The catheter was further advanced into the proximal trunk of the SMA over a guidewire. Selective arteriography of the SMA was performed. A 2.4 French microcatheter was then advanced through the 5 [place] catheter and initially used to selectively catheterize a second order branch of the superior mesenteric artery. The catheter was retracted and utilized in catheterizing an additional second order branch and selective arteriography performed. The catheter was then used to selectively catheterize a third order branch supplying distal small bowel and proximal colon. Selective arteriography was performed. The catheter was then used to selectively catheterized a fourth order ileocolic branch supplying the cecum and distal ileum and additional selective arteriography performed. Intra-arterial nitroglycerin  was administered into ileocolic supply. The microcatheter was then used to selectively catheterize a fifth order ileocolic branch and selective arteriography performed. The catheter was retracted and used to selectively catheterize an additional fifth order ileocolic branch and selective arteriography performed. Transcatheter embolization was then performed with advancement and detachment of 2 separate 2 mm x 4 cm length 0.018 inch embolization coils through the microcatheter. Additional arteriography was performed through the microcatheter. Oblique arteriography was performed at them femoral puncture site through the access sheath. Arteriotomy closure was then performed with the Angio-Seal device. FINDINGS: Initial proximal SMA arteriography did not reveal active bleeding in the distribution of the SMA and  in particular in the region of demonstrated bleeding by CTA at the level of the terminal ileum. Multiple selective catheterizations were then performed of higher order branches supplying the ileocolic distribution via a coaxial microcatheter based on location of the bleed by CTA and correlating arterial anatomy with the CTA performed earlier today. 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 Coil embolization was able to be performed of the branch at the base of the pseudoaneurysm and extending back to ileocolic supply without sacrificing significant perfusion of the cecum or terminal ileum. Completion arteriography does not demonstrate further contrast filling of the pseudoaneurysm or extravasation of contrast into the lumen of the terminal ileum. 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 This correlates exactly with the region of bleeding seen by CTA. Superselective transcatheter embolization was able to be performed via a microcatheter at the base of the bleeding pseudoaneurysm with embolization coils resulting in successful occlusion and no further visualized flow within the pseudoaneurysm or bleeding into the terminal ileum. Electronically Signed   By:  Erica Hau M.D.   On: 04/06/2024 16:18   IR Angiogram Selective Each Additional Vessel Result Date: 04/06/2024 INDICATION: Active GI bleed clinically with CTA today demonstrating active arterial bleed at the level of the distal terminal ileum. EXAM: 1. ULTRASOUND GUIDANCE FOR VASCULAR ACCESS OF THE RIGHT COMMON FEMORAL ARTERY 2.  SELECTIVE ARTERIOGRAPHY OF THE SUPERIOR MESENTERIC ARTERY 3. ADDITIONAL SELECTIVE ARTERIOGRAPHY OF SECOND ORDER SUPERIOR MESENTERIC ARTERY BRANCH 4. ADDITIONAL SELECTIVE ARTERIOGRAPHY OF SECOND ORDER SUPERIOR MESENTERIC ARTERY BRANCH 5. ADDITIONAL SECOND ORDER ARTERIOGRAPHY OF THIRD ORDER SUPERIOR MESENTERIC ARTERY BRANCH 6. ADDITIONAL SELECTIVE ARTERIOGRAPHY OF FOURTH ORDER SUPERIOR MESENTERIC ARTERY BRANCH 7. ADDITIONAL SELECTIVE ARTERIOGRAPHY OF FIFTH ORDER ILEOCOLIC SUPERIOR MESENTERIC ARTERY BRANCH 8. ADDITIONAL SELECTIVE ARTERIOGRAPHY OF SEPARATE FIFTH ORDER ILEOCOLIC SUPERIOR MESENTERIC ARTERY BRANCH 9. TRANSCATHETER ARTERIOGRAPHY OF ILEOCOLIC SUPERIOR MESENTERIC ARTERY BRANCH MEDICATIONS: During the procedure, 100 mcg of intra-arterial nitroglycerin  was administered in a superior mesenteric artery branch. ANESTHESIA/SEDATION: Moderate (conscious) sedation was employed during this procedure. A total of Versed  2.0 mg and 12.5 mg of IV Benadryl  was administered intravenously. Moderate Sedation Time: 60 minutes. The patient's level of consciousness and vital signs were monitored continuously by radiology nursing throughout the procedure under my direct supervision. CONTRAST:  62 mL Visipaque 320 FLUOROSCOPY TIME:  Radiation Exposure Index (as provided by the fluoroscopic device): 457 mGy Kerma COMPLICATIONS: None immediate. PROCEDURE: Informed consent was obtained from the patient following explanation of the procedure, risks, benefits and alternatives. The patient understands, agrees and consents for the procedure. All questions were addressed. A time out was performed prior to the initiation of the procedure. Maximal barrier sterile technique utilized including caps, mask, sterile gowns, sterile gloves, large sterile drape, hand hygiene, and chlorhexidine  prep. Ultrasound was used to confirm patency of the right common femoral artery. An ultrasound image was saved and recorded. Under ultrasound guidance,  access of the right common femoral artery was performed with a micropuncture set. A 5 French sheath was placed over a guidewire. A 5 French Cobra catheter was advanced over a wire into the abdominal aorta. This was used to selectively catheterize the superior mesenteric artery. The catheter was further advanced into the proximal trunk of the SMA over a guidewire. Selective arteriography of the SMA was performed. A 2.4 French microcatheter was then advanced through the 5 [place] catheter and initially used to selectively catheterize a second order branch of the superior mesenteric artery. The catheter was retracted and utilized in catheterizing an additional second order branch and selective arteriography performed. The catheter was then used to selectively catheterize a third order branch supplying distal small bowel and proximal colon. Selective arteriography was performed. The catheter was then used to selectively catheterized a fourth order ileocolic branch supplying the cecum and distal ileum and additional selective arteriography performed. Intra-arterial nitroglycerin  was administered into ileocolic supply. The microcatheter was then used to selectively catheterize a fifth order ileocolic branch and selective arteriography performed. The catheter was retracted and used to selectively catheterize an additional fifth order ileocolic branch and selective arteriography performed. Transcatheter embolization was then performed with advancement and detachment of 2 separate 2 mm x 4 cm length 0.018 inch embolization coils through the microcatheter. Additional arteriography was performed through the microcatheter. Oblique arteriography was performed at them femoral puncture site through the access sheath. Arteriotomy closure was then performed with the Angio-Seal device. FINDINGS: Initial proximal SMA arteriography did not reveal active bleeding in the distribution of the SMA and in particular in the region of  demonstrated bleeding by  CTA at the level of the terminal ileum. Multiple selective catheterizations were then performed of higher order branches supplying the ileocolic distribution via a coaxial microcatheter based on location of the bleed by CTA and correlating arterial anatomy with the CTA performed earlier today. 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 Coil embolization was able to be performed of the branch at the base of the pseudoaneurysm and extending back to ileocolic supply without sacrificing significant perfusion of the cecum or terminal ileum. Completion arteriography does not demonstrate further contrast filling of the pseudoaneurysm or extravasation of contrast into the lumen of the terminal ileum. 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 This correlates exactly with the region of bleeding seen by CTA. Superselective transcatheter embolization was able to be performed via a microcatheter at the base of the bleeding pseudoaneurysm with embolization coils resulting in successful occlusion and no further visualized flow within the pseudoaneurysm or bleeding into the terminal ileum. Electronically Signed   By: Erica Hau M.D.   On: 04/06/2024 16:18   IR Angiogram Selective Each Additional Vessel Result Date: 04/06/2024 INDICATION: Active GI bleed clinically with CTA today demonstrating active arterial bleed at the level of the distal terminal ileum. EXAM: 1. ULTRASOUND GUIDANCE FOR VASCULAR ACCESS OF THE RIGHT COMMON FEMORAL ARTERY 2. SELECTIVE ARTERIOGRAPHY OF THE  SUPERIOR MESENTERIC ARTERY 3. ADDITIONAL SELECTIVE ARTERIOGRAPHY OF SECOND ORDER SUPERIOR MESENTERIC ARTERY BRANCH 4. ADDITIONAL SELECTIVE ARTERIOGRAPHY OF SECOND ORDER SUPERIOR MESENTERIC ARTERY BRANCH 5. ADDITIONAL SECOND ORDER ARTERIOGRAPHY OF THIRD ORDER SUPERIOR MESENTERIC ARTERY BRANCH 6. ADDITIONAL SELECTIVE ARTERIOGRAPHY OF FOURTH ORDER SUPERIOR MESENTERIC ARTERY BRANCH 7. ADDITIONAL SELECTIVE ARTERIOGRAPHY OF FIFTH ORDER ILEOCOLIC SUPERIOR MESENTERIC ARTERY BRANCH 8. ADDITIONAL SELECTIVE ARTERIOGRAPHY OF SEPARATE FIFTH ORDER ILEOCOLIC SUPERIOR MESENTERIC ARTERY BRANCH 9. TRANSCATHETER ARTERIOGRAPHY OF ILEOCOLIC SUPERIOR MESENTERIC ARTERY BRANCH MEDICATIONS: During the procedure, 100 mcg of intra-arterial nitroglycerin  was administered in a superior mesenteric artery branch. ANESTHESIA/SEDATION: Moderate (conscious) sedation was employed during this procedure. A total of Versed  2.0 mg and 12.5 mg of IV Benadryl  was administered intravenously. Moderate Sedation Time: 60 minutes. The patient's level of consciousness and vital signs were monitored continuously by radiology nursing throughout the procedure under my direct supervision. CONTRAST:  62 mL Visipaque 320 FLUOROSCOPY TIME:  Radiation Exposure Index (as provided by the fluoroscopic device): 457 mGy Kerma COMPLICATIONS: None immediate. PROCEDURE: Informed consent was obtained from the patient following explanation of the procedure, risks, benefits and alternatives. The patient understands, agrees and consents for the procedure. All questions were addressed. A time out was performed prior to the initiation of the procedure. Maximal barrier sterile technique utilized including caps, mask, sterile gowns, sterile gloves, large sterile drape, hand hygiene, and chlorhexidine  prep. Ultrasound was used to confirm patency of the right common femoral artery. An ultrasound image was saved and recorded. Under ultrasound guidance, access of the right common femoral  artery was performed with a micropuncture set. A 5 French sheath was placed over a guidewire. A 5 French Cobra catheter was advanced over a wire into the abdominal aorta. This was used to selectively catheterize the superior mesenteric artery. The catheter was further advanced into the proximal trunk of the  SMA over a guidewire. Selective arteriography of the SMA was performed. A 2.4 French microcatheter was then advanced through the 5 [place] catheter and initially used to selectively catheterize a second order branch of the superior mesenteric artery. The catheter was retracted and utilized in catheterizing an additional second order branch and selective arteriography performed. The catheter was then used to selectively catheterize a third order branch supplying distal small bowel and proximal colon. Selective arteriography was performed. The catheter was then used to selectively catheterized a fourth order ileocolic branch supplying the cecum and distal ileum and additional selective arteriography performed. Intra-arterial nitroglycerin  was administered into ileocolic supply. The microcatheter was then used to selectively catheterize a fifth order ileocolic branch and selective arteriography performed. The catheter was retracted and used to selectively catheterize an additional fifth order ileocolic branch and selective arteriography performed. Transcatheter embolization was then performed with advancement and detachment of 2 separate 2 mm x 4 cm length 0.018 inch embolization coils through the microcatheter. Additional arteriography was performed through the microcatheter. Oblique arteriography was performed at them femoral puncture site through the access sheath. Arteriotomy closure was then performed with the Angio-Seal device. FINDINGS: Initial proximal SMA arteriography did not reveal active bleeding in the distribution of the SMA and in particular in the region of demonstrated bleeding by CTA at the level of  the terminal ileum. Multiple selective catheterizations were then performed of higher order branches supplying the ileocolic distribution via a coaxial microcatheter based on location of the bleed by CTA and correlating arterial anatomy with the CTA performed earlier today. 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 Coil embolization was able to be performed of the branch at the base of the pseudoaneurysm and extending back to ileocolic supply without sacrificing significant perfusion of the cecum or terminal ileum. Completion arteriography does not demonstrate further contrast filling of the pseudoaneurysm or extravasation of contrast into the lumen of the terminal ileum. 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 This correlates exactly with the region of bleeding seen by CTA. Superselective transcatheter embolization was able to be performed via a microcatheter at the base of the bleeding pseudoaneurysm with embolization coils resulting in successful occlusion and no further visualized flow within the pseudoaneurysm or bleeding into the terminal ileum. Electronically Signed   By: Erica Hau M.D.   On: 04/06/2024 16:18   IR Angiogram Selective Each Additional Vessel Result Date: 04/06/2024 INDICATION: Active GI bleed clinically with CTA today demonstrating active arterial bleed at the level of the distal terminal ileum. EXAM: 1. ULTRASOUND GUIDANCE FOR VASCULAR ACCESS OF THE RIGHT COMMON FEMORAL ARTERY 2. SELECTIVE ARTERIOGRAPHY OF THE SUPERIOR MESENTERIC ARTERY 3. ADDITIONAL  SELECTIVE ARTERIOGRAPHY OF SECOND ORDER SUPERIOR MESENTERIC ARTERY BRANCH 4. ADDITIONAL SELECTIVE ARTERIOGRAPHY OF SECOND ORDER SUPERIOR MESENTERIC ARTERY BRANCH 5. ADDITIONAL SECOND ORDER ARTERIOGRAPHY OF THIRD ORDER SUPERIOR MESENTERIC ARTERY BRANCH 6. ADDITIONAL SELECTIVE ARTERIOGRAPHY OF FOURTH ORDER SUPERIOR MESENTERIC ARTERY BRANCH 7. ADDITIONAL SELECTIVE ARTERIOGRAPHY OF FIFTH ORDER ILEOCOLIC SUPERIOR MESENTERIC ARTERY BRANCH 8. ADDITIONAL SELECTIVE ARTERIOGRAPHY OF SEPARATE FIFTH ORDER ILEOCOLIC SUPERIOR MESENTERIC ARTERY BRANCH 9. TRANSCATHETER ARTERIOGRAPHY OF ILEOCOLIC SUPERIOR MESENTERIC ARTERY BRANCH MEDICATIONS: During the procedure, 100 mcg of  intra-arterial nitroglycerin  was administered in a superior mesenteric artery branch. ANESTHESIA/SEDATION: Moderate (conscious) sedation was employed during this procedure. A total of Versed  2.0 mg and 12.5 mg of IV Benadryl  was administered intravenously. Moderate Sedation Time: 60 minutes. The patient's level of consciousness and vital signs were monitored continuously by radiology nursing throughout the procedure under my direct supervision. CONTRAST:  62 mL Visipaque 320 FLUOROSCOPY TIME:  Radiation Exposure Index (as provided by the fluoroscopic device): 457 mGy Kerma COMPLICATIONS: None immediate. PROCEDURE: Informed consent was obtained from the patient following explanation of the procedure, risks, benefits and alternatives. The patient understands, agrees and consents for the procedure. All questions were addressed. A time out was performed prior to the initiation of the procedure. Maximal barrier sterile technique utilized including caps, mask, sterile gowns, sterile gloves, large sterile drape, hand hygiene, and chlorhexidine  prep. Ultrasound was used to confirm patency of the right common femoral artery. An ultrasound image was saved and recorded. Under ultrasound guidance, access of the right common femoral artery was performed with a micropuncture  set. A 5 French sheath was placed over a guidewire. A 5 French Cobra catheter was advanced over a wire into the abdominal aorta. This was used to selectively catheterize the superior mesenteric artery. The catheter was further advanced into the proximal trunk of the SMA over a guidewire. Selective arteriography of the SMA was performed. A 2.4 French microcatheter was then advanced through the 5 [place] catheter and initially used to selectively catheterize a second order branch of the superior mesenteric artery. The catheter was retracted and utilized in catheterizing an additional second order branch and selective arteriography performed. The catheter was then used to selectively catheterize a third order branch supplying distal small bowel and proximal colon. Selective arteriography was performed. The catheter was then used to selectively catheterized a fourth order ileocolic branch supplying the cecum and distal ileum and additional selective arteriography performed. Intra-arterial nitroglycerin  was administered into ileocolic supply. The microcatheter was then used to selectively catheterize a fifth order ileocolic branch and selective arteriography performed. The catheter was retracted and used to selectively catheterize an additional fifth order ileocolic branch and selective arteriography performed. Transcatheter embolization was then performed with advancement and detachment of 2 separate 2 mm x 4 cm length 0.018 inch embolization coils through the microcatheter. Additional arteriography was performed through the microcatheter. Oblique arteriography was performed at them femoral puncture site through the access sheath. Arteriotomy closure was then performed with the Angio-Seal device. FINDINGS: Initial proximal SMA arteriography did not reveal active bleeding in the distribution of the SMA and in particular in the region of demonstrated bleeding by CTA at the level of the terminal ileum. Multiple selective  catheterizations were then performed of higher order branches supplying the ileocolic distribution via a coaxial microcatheter based on location of the bleed by CTA and correlating arterial anatomy with the CTA performed earlier today. 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 Coil embolization was able to be performed of the branch at the base of the pseudoaneurysm and extending back to ileocolic supply without sacrificing significant perfusion of the cecum or terminal ileum. Completion arteriography does not demonstrate further contrast filling of the pseudoaneurysm or extravasation of contrast into the lumen of the terminal ileum. IMPRESSION: Superselective SMA arteriography demonstrated bleeding source as a  distal ileocolic branch vessel pseudoaneurysm with active bleeding and extravasation of contrast into the lumen of the terminal ileum near the ileocecal valve. This correlates exactly with the region of bleeding seen by CTA. Superselective transcatheter embolization was able to be performed via a microcatheter at the base of the bleeding pseudoaneurysm with embolization coils resulting in successful occlusion and no further visualized flow within the pseudoaneurysm or bleeding into the terminal ileum. Electronically Signed   By: Erica Hau M.D.   On: 04/06/2024 16:18   IR Angiogram Selective Each Additional Vessel Result Date: 04/06/2024 INDICATION: Active GI bleed clinically with CTA today demonstrating active arterial bleed at the level of the distal terminal ileum. EXAM: 1. ULTRASOUND GUIDANCE FOR VASCULAR ACCESS OF THE RIGHT COMMON FEMORAL ARTERY 2. SELECTIVE ARTERIOGRAPHY OF THE SUPERIOR MESENTERIC ARTERY 3. ADDITIONAL SELECTIVE ARTERIOGRAPHY OF SECOND ORDER  SUPERIOR MESENTERIC ARTERY BRANCH 4. ADDITIONAL SELECTIVE ARTERIOGRAPHY OF SECOND ORDER SUPERIOR MESENTERIC ARTERY BRANCH 5. ADDITIONAL SECOND ORDER ARTERIOGRAPHY OF THIRD ORDER SUPERIOR MESENTERIC ARTERY BRANCH 6. ADDITIONAL SELECTIVE ARTERIOGRAPHY OF FOURTH ORDER SUPERIOR MESENTERIC ARTERY BRANCH 7. ADDITIONAL SELECTIVE ARTERIOGRAPHY OF FIFTH ORDER ILEOCOLIC SUPERIOR MESENTERIC ARTERY BRANCH 8. ADDITIONAL SELECTIVE ARTERIOGRAPHY OF SEPARATE FIFTH ORDER ILEOCOLIC SUPERIOR MESENTERIC ARTERY BRANCH 9. TRANSCATHETER ARTERIOGRAPHY OF ILEOCOLIC SUPERIOR MESENTERIC ARTERY BRANCH MEDICATIONS: During the procedure, 100 mcg of intra-arterial nitroglycerin  was administered in a superior mesenteric artery branch. ANESTHESIA/SEDATION: Moderate (conscious) sedation was employed during this procedure. A total of Versed  2.0 mg and 12.5 mg of IV Benadryl  was administered intravenously. Moderate Sedation Time: 60 minutes. The patient's level of consciousness and vital signs were monitored continuously by radiology nursing throughout the procedure under my direct supervision. CONTRAST:  62 mL Visipaque 320 FLUOROSCOPY TIME:  Radiation Exposure Index (as provided by the fluoroscopic device): 457 mGy Kerma COMPLICATIONS: None immediate. PROCEDURE: Informed consent was obtained from the patient following explanation of the procedure, risks, benefits and alternatives. The patient understands, agrees and consents for the procedure. All questions were addressed. A time out was performed prior to the initiation of the procedure. Maximal barrier sterile technique utilized including caps, mask, sterile gowns, sterile gloves, large sterile drape, hand hygiene, and chlorhexidine  prep. Ultrasound was used to confirm patency of the right common femoral artery. An ultrasound image was saved and recorded. Under ultrasound guidance, access of the right common femoral artery was performed with a micropuncture set. A 5 French sheath was placed over a  guidewire. A 5 French Cobra catheter was advanced over a wire into the abdominal aorta. This was used to selectively catheterize the superior mesenteric artery. The catheter was further advanced into the proximal trunk of the SMA over a guidewire. Selective arteriography of the SMA was performed. A 2.4 French microcatheter was then advanced through the 5 [place] catheter and initially used to selectively catheterize a second order branch of the superior mesenteric artery. The catheter was retracted and utilized in catheterizing an additional second order branch and selective arteriography performed. The catheter was then used to selectively catheterize a third order branch supplying distal small bowel and proximal colon. Selective arteriography was performed. The catheter was then used to selectively catheterized a fourth order ileocolic branch supplying the cecum and distal ileum and additional selective arteriography performed. Intra-arterial nitroglycerin  was administered into ileocolic supply. The microcatheter was then used to selectively catheterize a fifth order ileocolic branch and selective arteriography performed. The catheter was retracted and used to selectively catheterize an additional fifth order ileocolic branch and selective arteriography performed. Transcatheter embolization was  then performed with advancement and detachment of 2 separate 2 mm x 4 cm length 0.018 inch embolization coils through the microcatheter. Additional arteriography was performed through the microcatheter. Oblique arteriography was performed at them femoral puncture site through the access sheath. Arteriotomy closure was then performed with the Angio-Seal device. FINDINGS: Initial proximal SMA arteriography did not reveal active bleeding in the distribution of the SMA and in particular in the region of demonstrated bleeding by CTA at the level of the terminal ileum. Multiple selective catheterizations were then performed of  higher order branches supplying the ileocolic distribution via a coaxial microcatheter based on location of the bleed by CTA and correlating arterial anatomy with the CTA performed earlier today. 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 Coil embolization was able to be performed of the branch at the base of the pseudoaneurysm and extending back to ileocolic supply without sacrificing significant perfusion of the cecum or terminal ileum. Completion arteriography does not demonstrate further contrast filling of the pseudoaneurysm or extravasation of contrast into the lumen of the terminal ileum. 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 This correlates exactly with the region of bleeding seen by CTA. Superselective transcatheter embolization was able to be performed via a microcatheter at the base of the bleeding pseudoaneurysm with embolization coils resulting in successful occlusion and no further visualized flow within the pseudoaneurysm or bleeding into the terminal ileum. Electronically Signed   By: Erica Hau M.D.   On: 04/06/2024 16:18   IR Angiogram Selective Each Additional Vessel Result Date: 04/06/2024 INDICATION: Active GI bleed clinically with CTA today demonstrating active arterial bleed at the level of the distal terminal ileum. EXAM: 1. ULTRASOUND GUIDANCE FOR VASCULAR ACCESS OF THE RIGHT COMMON FEMORAL ARTERY 2. SELECTIVE ARTERIOGRAPHY OF THE SUPERIOR MESENTERIC ARTERY 3. ADDITIONAL SELECTIVE ARTERIOGRAPHY OF SECOND ORDER SUPERIOR MESENTERIC ARTERY BRANCH 4.  ADDITIONAL SELECTIVE ARTERIOGRAPHY OF SECOND ORDER SUPERIOR MESENTERIC ARTERY BRANCH 5. ADDITIONAL SECOND ORDER ARTERIOGRAPHY OF THIRD ORDER SUPERIOR MESENTERIC ARTERY BRANCH 6. ADDITIONAL SELECTIVE ARTERIOGRAPHY OF FOURTH ORDER SUPERIOR MESENTERIC ARTERY BRANCH 7. ADDITIONAL SELECTIVE ARTERIOGRAPHY OF FIFTH ORDER ILEOCOLIC SUPERIOR MESENTERIC ARTERY BRANCH 8. ADDITIONAL SELECTIVE ARTERIOGRAPHY OF SEPARATE FIFTH ORDER ILEOCOLIC SUPERIOR MESENTERIC ARTERY BRANCH 9. TRANSCATHETER ARTERIOGRAPHY OF ILEOCOLIC SUPERIOR MESENTERIC ARTERY BRANCH MEDICATIONS: During the procedure, 100 mcg of intra-arterial nitroglycerin  was administered in a superior mesenteric artery branch. ANESTHESIA/SEDATION: Moderate (conscious) sedation was employed during this procedure. A total of Versed  2.0 mg and 12.5 mg of IV Benadryl  was administered intravenously. Moderate Sedation Time: 60 minutes. The patient's level of consciousness and vital signs were monitored continuously by radiology nursing throughout the procedure under my direct supervision. CONTRAST:  62 mL Visipaque 320 FLUOROSCOPY TIME:  Radiation Exposure Index (as provided by the fluoroscopic device): 457 mGy Kerma COMPLICATIONS: None immediate. PROCEDURE: Informed consent was obtained from the patient following explanation of the procedure, risks, benefits and alternatives. The patient understands, agrees and consents for the procedure. All questions were addressed. A time out was performed prior to the initiation of the procedure. Maximal barrier sterile technique utilized including caps, mask, sterile gowns, sterile gloves, large sterile drape, hand hygiene, and chlorhexidine  prep. Ultrasound was  used to confirm patency of the right common femoral artery. An ultrasound image was saved and recorded. Under ultrasound guidance, access of the right common femoral artery was performed with a micropuncture set. A 5 French sheath was placed over a guidewire. A 5 French Cobra catheter  was advanced over a wire into the abdominal aorta. This was used to selectively catheterize the superior mesenteric artery. The catheter was further advanced into the proximal trunk of the SMA over a guidewire. Selective arteriography of the SMA was performed. A 2.4 French microcatheter was then advanced through the 5 [place] catheter and initially used to selectively catheterize a second order branch of the superior mesenteric artery. The catheter was retracted and utilized in catheterizing an additional second order branch and selective arteriography performed. The catheter was then used to selectively catheterize a third order branch supplying distal small bowel and proximal colon. Selective arteriography was performed. The catheter was then used to selectively catheterized a fourth order ileocolic branch supplying the cecum and distal ileum and additional selective arteriography performed. Intra-arterial nitroglycerin  was administered into ileocolic supply. The microcatheter was then used to selectively catheterize a fifth order ileocolic branch and selective arteriography performed. The catheter was retracted and used to selectively catheterize an additional fifth order ileocolic branch and selective arteriography performed. Transcatheter embolization was then performed with advancement and detachment of 2 separate 2 mm x 4 cm length 0.018 inch embolization coils through the microcatheter. Additional arteriography was performed through the microcatheter. Oblique arteriography was performed at them femoral puncture site through the access sheath. Arteriotomy closure was then performed with the Angio-Seal device. FINDINGS: Initial proximal SMA arteriography did not reveal active bleeding in the distribution of the SMA and in particular in the region of demonstrated bleeding by CTA at the level of the terminal ileum. Multiple selective catheterizations were then performed of higher order branches supplying the  ileocolic distribution via a coaxial microcatheter based on location of the bleed by CTA and correlating arterial anatomy with the CTA performed earlier today. 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 Coil embolization was able to be performed of the branch at the base of the pseudoaneurysm and extending back to ileocolic supply without sacrificing significant perfusion of the cecum or terminal ileum. Completion arteriography does not demonstrate further contrast filling of the pseudoaneurysm or extravasation of contrast into the lumen of the terminal ileum. 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 This correlates exactly with the region of bleeding seen by CTA. Superselective transcatheter embolization was able to be performed via a microcatheter at the base of the bleeding pseudoaneurysm with embolization coils resulting in successful occlusion and no further visualized flow within the pseudoaneurysm or bleeding into the terminal ileum. Electronically Signed   By: Erica Hau M.D.   On: 04/06/2024 16:18   IR Angiogram Selective Each Additional Vessel Result Date: 04/06/2024 INDICATION: Active GI bleed clinically with CTA today demonstrating active arterial bleed at the level of the distal terminal ileum. EXAM: 1. ULTRASOUND GUIDANCE FOR VASCULAR ACCESS OF THE RIGHT COMMON FEMORAL ARTERY 2. SELECTIVE ARTERIOGRAPHY OF THE SUPERIOR MESENTERIC ARTERY 3. ADDITIONAL SELECTIVE ARTERIOGRAPHY OF SECOND ORDER SUPERIOR MESENTERIC  ARTERY BRANCH 4. ADDITIONAL SELECTIVE ARTERIOGRAPHY OF  SECOND ORDER SUPERIOR MESENTERIC ARTERY BRANCH 5. ADDITIONAL SECOND ORDER ARTERIOGRAPHY OF THIRD ORDER SUPERIOR MESENTERIC ARTERY BRANCH 6. ADDITIONAL SELECTIVE ARTERIOGRAPHY OF FOURTH ORDER SUPERIOR MESENTERIC ARTERY BRANCH 7. ADDITIONAL SELECTIVE ARTERIOGRAPHY OF FIFTH ORDER ILEOCOLIC SUPERIOR MESENTERIC ARTERY BRANCH 8. ADDITIONAL SELECTIVE ARTERIOGRAPHY OF SEPARATE FIFTH ORDER ILEOCOLIC SUPERIOR MESENTERIC ARTERY BRANCH 9. TRANSCATHETER ARTERIOGRAPHY OF ILEOCOLIC SUPERIOR MESENTERIC ARTERY BRANCH MEDICATIONS: During the procedure, 100 mcg of intra-arterial nitroglycerin  was administered in a superior mesenteric artery branch. ANESTHESIA/SEDATION: Moderate (conscious) sedation was employed during this procedure. A total of Versed  2.0 mg and 12.5 mg of IV Benadryl  was administered intravenously. Moderate Sedation Time: 60 minutes. The patient's level of consciousness and vital signs were monitored continuously by radiology nursing throughout the procedure under my direct supervision. CONTRAST:  62 mL Visipaque 320 FLUOROSCOPY TIME:  Radiation Exposure Index (as provided by the fluoroscopic device): 457 mGy Kerma COMPLICATIONS: None immediate. PROCEDURE: Informed consent was obtained from the patient following explanation of the procedure, risks, benefits and alternatives. The patient understands, agrees and consents for the procedure. All questions were addressed. A time out was performed prior to the initiation of the procedure. Maximal barrier sterile technique utilized including caps, mask, sterile gowns, sterile gloves, large sterile drape, hand hygiene, and chlorhexidine  prep. Ultrasound was used to confirm patency of the right common femoral artery. An ultrasound image was saved and recorded. Under ultrasound guidance, access of the right common femoral artery was performed with a micropuncture set. A 5 French sheath was placed over a guidewire. A 5 French Cobra catheter was advanced over a wire into the  abdominal aorta. This was used to selectively catheterize the superior mesenteric artery. The catheter was further advanced into the proximal trunk of the SMA over a guidewire. Selective arteriography of the SMA was performed. A 2.4 French microcatheter was then advanced through the 5 [place] catheter and initially used to selectively catheterize a second order branch of the superior mesenteric artery. The catheter was retracted and utilized in catheterizing an additional second order branch and selective arteriography performed. The catheter was then used to selectively catheterize a third order branch supplying distal small bowel and proximal colon. Selective arteriography was performed. The catheter was then used to selectively catheterized a fourth order ileocolic branch supplying the cecum and distal ileum and additional selective arteriography performed. Intra-arterial nitroglycerin  was administered into ileocolic supply. The microcatheter was then used to selectively catheterize a fifth order ileocolic branch and selective arteriography performed. The catheter was retracted and used to selectively catheterize an additional fifth order ileocolic branch and selective arteriography performed. Transcatheter embolization was then performed with advancement and detachment of 2 separate 2 mm x 4 cm length 0.018 inch embolization coils through the microcatheter. Additional arteriography was performed through the microcatheter. Oblique arteriography was performed at them femoral puncture site through the access sheath. Arteriotomy closure was then performed with the Angio-Seal device. FINDINGS: Initial proximal SMA arteriography did not reveal active bleeding in the distribution of the SMA and in particular in the region of demonstrated bleeding by CTA at the level of the terminal ileum. Multiple selective catheterizations were then performed of higher order branches supplying the ileocolic distribution via a coaxial  microcatheter based on location of the bleed by CTA and correlating arterial anatomy with the CTA performed earlier today. 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 Coil embolization was able to be performed of the branch at the base of the pseudoaneurysm and extending back to ileocolic supply without sacrificing significant perfusion of the cecum or terminal ileum. Completion arteriography does not demonstrate further contrast filling of the pseudoaneurysm or extravasation of contrast into the lumen of the terminal ileum. 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 This correlates exactly with the region of bleeding seen by CTA. Superselective transcatheter embolization was able to be performed via a microcatheter at the base of the bleeding pseudoaneurysm with embolization coils resulting in successful occlusion and no further visualized flow within the pseudoaneurysm or bleeding into the terminal ileum. Electronically Signed   By: Erica Hau M.D.   On: 04/06/2024 16:18   CT ANGIO GI BLEED Result Date: 04/06/2024 CLINICAL DATA:  Mesenteric ischemia. Active GI bleed. Diverticulosis. EXAM: CTA ABDOMEN AND PELVIS WITHOUT AND WITH CONTRAST TECHNIQUE: Multidetector CT imaging of the abdomen and pelvis was performed using the standard protocol during bolus administration of intravenous contrast. Multiplanar reconstructed images and MIPs were obtained and reviewed to evaluate the vascular anatomy. RADIATION DOSE REDUCTION: This exam was performed according to the departmental  dose-optimization program which includes automated exposure control, adjustment of the mA and/or kV according to patient size and/or use of iterative reconstruction technique. CONTRAST:  OMNIPAQUE  IOHEXOL  350 MG/ML SOLN COMPARISON:  04/05/2024. FINDINGS: VASCULAR Aorta: Normal caliber aorta without aneurysm, dissection, vasculitis or significant stenosis. There are atheromatous calcifications. Celiac: Patent without evidence of aneurysm, dissection, vasculitis or significant stenosis. SMA: Patent without evidence of aneurysm, dissection, vasculitis or significant stenosis. Renals: Both renal arteries are patent without evidence of aneurysm, dissection, vasculitis, fibromuscular dysplasia or significant stenosis. IMA: Patent without evidence of aneurysm, dissection, vasculitis or significant stenosis. Inflow: Patent without evidence of aneurysm, dissection, vasculitis or significant stenosis. Proximal Outflow: Bilateral common femoral and visualized portions of the superficial and profunda femoral arteries are patent without evidence of aneurysm, dissection, vasculitis or significant stenosis. Veins: No obvious venous abnormality within the limitations of this arterial phase study. Review of the MIP images confirms the above findings. NON-VASCULAR Lower chest: No acute abnormality. No pericardial or pleural effusions. Hepatobiliary: No focal liver abnormality is seen. No biliary ductal dilatation. Gallbladder contains excreted contrast. No pericholecystic inflammatory changes. Pancreas: Unremarkable. No pancreatic ductal dilatation or surrounding inflammatory changes. Spleen: Normal in size without focal abnormality. Adrenals/Urinary Tract: 1 cm stone left kidney. No hydronephrosis. 7 mm stone right kidney. Bilateral subcentimeter renal cysts. No hydronephrosis. Unremarkable urinary bladder containing excreted IV contrast from the prior study. Stomach/Bowel: There is diffuse colonic diverticulosis. No bowel  dilatation to suggest obstruction. There is evidence of extravasated IV contrast in the terminal ileum on the early postcontrast images which represents a change from yesterday's examination, consistent with an active GI bleed. Rectosigmoid anastomosis. No bowel dilatation to suggest obstruction. Lymphatic: Aortic atherosclerosis. No enlarged abdominal or pelvic lymph nodes. Reproductive: Status post hysterectomy. No adnexal masses. Other: No abdominal wall hernia or abnormality. No abdominopelvic ascites. Musculoskeletal: Lumbosacral degenerative changes. IMPRESSION: 1. Active GI bleed in the terminal ileum. 2. Diverticulosis. 3. Bilateral nephrolithiasis. 4. Aortic atherosclerosis (ICD10-I70.0). Electronically Signed   By: Sydell Eva M.D.   On: 04/06/2024 11:17   CT ANGIO GI BLEED Result Date: 04/05/2024 CLINICAL  DATA:  Lower GI bleed, bright red blood EXAM: CTA ABDOMEN AND PELVIS WITHOUT AND WITH CONTRAST TECHNIQUE: Multidetector CT imaging of the abdomen and pelvis was performed using the standard protocol during bolus administration of intravenous contrast. Multiplanar reconstructed images and MIPs were obtained and reviewed to evaluate the vascular anatomy. RADIATION DOSE REDUCTION: This exam was performed according to the departmental dose-optimization program which includes automated exposure control, adjustment of the mA and/or kV according to patient size and/or use of iterative reconstruction technique. CONTRAST:  75mL OMNIPAQUE  IOHEXOL  350 MG/ML SOLN COMPARISON:  CT abdomen pelvis earlier today FINDINGS: VASCULAR Calcified atherosclerotic plaque in the aorta and its mesenteric, renal, and iliac artery branches without hemodynamically significant stenosis, aneurysm, or dissection. Review of the MIP images confirms the above findings. NON-VASCULAR Lower chest: No acute abnormality. Hepatobiliary: No acute abnormality. Hypoattenuating lesions in the left hepatic lobe are too small to definitively  characterize but likely represent benign cysts. Pancreas: Unremarkable. Spleen: Unremarkable. Adrenals/Urinary Tract: Normal adrenal glands. Nonobstructing by rough lateral nephrolithiasis. No urinary calculi or hydronephrosis. Unremarkable bladder. Stomach/Bowel: Stomach is within normal limits. No bowel obstruction. Colonic diverticulosis without evidence of diverticulitis. Question mild wall thickening and mucosal hyperenhancement of the ascending, transverse, and descending colon. No adjacent inflammatory stranding or fluid. No evidence of active GI bleeding. Postoperative change about the sigmoid colon. Normal appendix. Lymphatic: No lymphadenopathy. Reproductive: Hysterectomy. Other: No free intraperitoneal fluid or air. Musculoskeletal: No acute fracture. IMPRESSION: 1. Question mild colitis of the ascending, transverse, and descending colon. No evidence of active GI bleeding. 2. Colonic diverticulosis. 3. Aortic Atherosclerosis (ICD10-I70.0). Electronically Signed   By: Rozell Cornet M.D.   On: 04/05/2024 22:23   CT ABDOMEN PELVIS WO CONTRAST Result Date: 04/05/2024 CLINICAL DATA:  Blood in stool. GI bleed with need for transfusion 1 year ago. Abdominal pain. EXAM: CT ABDOMEN AND PELVIS WITHOUT CONTRAST TECHNIQUE: Multidetector CT imaging of the abdomen and pelvis was performed following the standard protocol without IV contrast. RADIATION DOSE REDUCTION: This exam was performed according to the departmental dose-optimization program which includes automated exposure control, adjustment of the mA and/or kV according to patient size and/or use of iterative reconstruction technique. COMPARISON:  CT 07/25/2022 FINDINGS: Lower chest: No acute abnormality. Hepatobiliary: Unremarkable liver. Normal gallbladder. No biliary dilation. Pancreas: Unremarkable. Spleen: Unremarkable. Adrenals/Urinary Tract: Normal adrenal glands. Bilateral nonobstructing renal calculi. No hydronephrosis. Bladder is unremarkable.  Stomach/Bowel: Stomach is within normal limits. Normal caliber large and small bowel. Colonic diverticulosis without diverticulitis. No bowel wall thickening. Normal appendix. Postoperative change about the sigmoid colon. Assessment for active GI bleeding is not possible without IV contrast. Vascular/Lymphatic: Aortic atherosclerosis. No enlarged abdominal or pelvic lymph nodes. Reproductive: Status post hysterectomy. No adnexal masses. Other: No free intraperitoneal fluid or air. Musculoskeletal: No acute fracture. IMPRESSION: 1. No acute abnormality in the abdomen or pelvis. 2. Colonic diverticulosis without diverticulitis. 3. Bilateral nonobstructing renal calculi. 4. Aortic Atherosclerosis (ICD10-I70.0). Electronically Signed   By: Rozell Cornet M.D.   On: 04/05/2024 20:42       Stephanie Barber M.D. [facility] 04/07/2024, 11:05 AM  Available via Epic secure chat 7am-7pm After 7 pm, please refer to night coverage provider listed on amion.

## 2024-04-07 NOTE — Plan of Care (Signed)

## 2024-04-07 NOTE — Plan of Care (Signed)

## 2024-04-07 NOTE — Progress Notes (Signed)
 Subjective: Old blood in stool. No ongoing active bleeding.  Objective: Vital signs in last 24 hours: Temp:  [97.3 F (36.3 C)-98.7 F (37.1 C)] 98.3 F (36.8 C) (05/26 1200) Pulse Rate:  [61-80] 70 (05/26 1000) Resp:  [0-20] 14 (05/26 1000) BP: (131-202)/(58-104) 131/70 (05/26 1000) SpO2:  [92 %-100 %] 95 % (05/26 1000) Weight change:  Last BM Date : 04/06/24  PE: GEN:  NAD ABD:  Soft, non-tender  Lab Results: CBC    Component Value Date/Time   WBC 6.7 04/07/2024 1125   RBC 3.58 (L) 04/07/2024 1125   HGB 11.3 (L) 04/07/2024 1125   HGB 11.3 (L) 06/06/2023 1110   HCT 35.6 (L) 04/07/2024 1125   PLT 104 (L) 04/07/2024 1125   PLT 228 06/06/2023 1110   MCV 99.4 04/07/2024 1125   MCH 31.6 04/07/2024 1125   MCHC 31.7 04/07/2024 1125   RDW 14.2 04/07/2024 1125   LYMPHSABS 1.1 04/05/2024 1745   MONOABS 0.4 04/05/2024 1745   EOSABS 0.1 04/05/2024 1745   BASOSABS 0.0 04/05/2024 1745  CMP     Component Value Date/Time   NA 140 04/07/2024 0038   K 3.5 04/07/2024 0038   CL 116 (H) 04/07/2024 0038   CO2 19 (L) 04/07/2024 0038   GLUCOSE 72 04/07/2024 0038   BUN 15 04/07/2024 0038   CREATININE 1.12 (H) 04/07/2024 0038   CREATININE 1.50 (H) 06/06/2023 1110   CALCIUM  7.9 (L) 04/07/2024 0038   PROT 7.6 04/05/2024 1745   ALBUMIN 4.0 04/05/2024 1745   AST 38 04/05/2024 1745   AST 16 06/06/2023 1110   ALT 23 04/05/2024 1745   ALT 15 06/06/2023 1110   ALKPHOS 51 04/05/2024 1745   BILITOT 1.0 04/05/2024 1745   BILITOT 0.4 06/06/2023 1110   GFRNONAA 48 (L) 04/07/2024 0038   GFRNONAA 34 (L) 06/06/2023 1110   Assessment:   Recurrent hematochezia.  CTA positive bleeding in terminal ileum.  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  No further active bleeding. Acute blood loss anemia.  Plan:   Follow CBC and clinical course. No role for colonoscopy. Advance diet as tolerated. [facility] will sign-off; please call with questions; thank you for the  consultation.   Yves Herb 04/07/2024, 2:51 PM   Cell [phone] If no answer or after 5 PM call [phone]

## 2024-04-08 ENCOUNTER — Other Ambulatory Visit (HOSPITAL_COMMUNITY): Payer: Self-pay

## 2024-04-08 ENCOUNTER — Encounter: Payer: Self-pay | Admitting: Physician Assistant

## 2024-04-08 DIAGNOSIS — K922 Gastrointestinal hemorrhage, unspecified: Secondary | ICD-10-CM | POA: Diagnosis not present

## 2024-04-08 DIAGNOSIS — N1832 Chronic kidney disease, stage 3b: Secondary | ICD-10-CM | POA: Diagnosis not present

## 2024-04-08 LAB — CBC
HCT: 35 % — ABNORMAL LOW (ref 36.0–46.0)
Hemoglobin: 11.7 g/dL — ABNORMAL LOW (ref 12.0–15.0)
MCH: 31.9 pg (ref 26.0–34.0)
MCHC: 33.4 g/dL (ref 30.0–36.0)
MCV: 95.4 fL (ref 80.0–100.0)
Platelets: 115 10*3/uL — ABNORMAL LOW (ref 150–400)
RBC: 3.67 MIL/uL — ABNORMAL LOW (ref 3.87–5.11)
RDW: 13.9 % (ref 11.5–15.5)
WBC: 8.7 10*3/uL (ref 4.0–10.5)
nRBC: 0 % (ref 0.0–0.2)

## 2024-04-08 LAB — BASIC METABOLIC PANEL WITH GFR
Anion gap: 9 (ref 5–15)
BUN: 14 mg/dL (ref 8–23)
CO2: 17 mmol/L — ABNORMAL LOW (ref 22–32)
Calcium: 9 mg/dL (ref 8.9–10.3)
Chloride: 114 mmol/L — ABNORMAL HIGH (ref 98–111)
Creatinine, Ser: 1.19 mg/dL — ABNORMAL HIGH (ref 0.44–1.00)
GFR, Estimated: 45 mL/min — ABNORMAL LOW (ref >60)
Glucose, Bld: 147 mg/dL — ABNORMAL HIGH (ref 70–99)
Potassium: 3.6 mmol/L (ref 3.5–5.1)
Sodium: 140 mmol/L (ref 135–145)

## 2024-04-08 MED ORDER — AMLODIPINE BESYLATE 5 MG PO TABS
2.5000 mg | ORAL_TABLET | Freq: Every day | ORAL | Status: DC
  Administered 2024-04-08: 2.5 mg via ORAL
  Filled 2024-04-08: qty 1

## 2024-04-08 MED ORDER — PANTOPRAZOLE SODIUM 40 MG PO TBEC
40.0000 mg | DELAYED_RELEASE_TABLET | Freq: Every day | ORAL | Status: DC
  Administered 2024-04-08: 40 mg via ORAL
  Filled 2024-04-08: qty 1

## 2024-04-08 MED ORDER — PANTOPRAZOLE SODIUM 40 MG PO TBEC
40.0000 mg | DELAYED_RELEASE_TABLET | Freq: Every day | ORAL | 11 refills | Status: AC
  Filled 2024-04-08: qty 30, 30d supply, fill #0

## 2024-04-08 MED ORDER — AMLODIPINE BESYLATE 2.5 MG PO TABS
2.5000 mg | ORAL_TABLET | Freq: Every day | ORAL | 3 refills | Status: DC
  Filled 2024-04-08: qty 30, 30d supply, fill #0

## 2024-04-08 MED ORDER — ONDANSETRON HCL 4 MG PO TABS
4.0000 mg | ORAL_TABLET | Freq: Three times a day (TID) | ORAL | 0 refills | Status: DC | PRN
  Filled 2024-04-08: qty 20, 7d supply, fill #0

## 2024-04-08 NOTE — Care Management Important Message (Signed)
 Important Message  Patient Details  Name: JAQUELINNE GLENDENING MRN: [id number] Date of Birth: [birth year]   Important Message Given:  N/A - LOS <3 / Initial given by admissions     Neila Bally 04/08/2024, 11:23 AM

## 2024-04-08 NOTE — Evaluation (Signed)
 Physical Therapy Evaluation Patient Details Name: Stephanie Barber MRN: [id number] DOB: August 28, 1941 Today's Date: 04/08/2024  History of Present Illness  Patient is a 83 year old female with HTN, hyperlipidemia, diet-controlled diabetes mellitus, OSA, CKD stage IIIb, diverticulosis presented with rectal bleeding.  Clinical Impression  The patient does demonstrate mild cognitive deficits. Patient ,herself,states that she has difficulty with her memory. Patient's spouse present and is eager for patient DC home.  Patient ambulated x 150' using a  Rw. Patient uses spouse rollator at home. Recommend rollator for patient  personal use./  Patient to Dc today.      If plan is discharge home, recommend the following: A little help with walking and/or transfers;A little help with bathing/dressing/bathroom;Direct supervision/assist for financial management;Assistance with cooking/housework;Assist for transportation;Direct supervision/assist for medications management   Can travel by private vehicle        Equipment Recommendations Rollator (4 wheels)  Recommendations for Other Services       Functional Status Assessment Patient has had a recent decline in their functional status and demonstrates the ability to make significant improvements in function in a reasonable and predictable amount of time.     Precautions / Restrictions Precautions Precautions: Fall Restrictions Weight Bearing Restrictions Per Provider Order: No      Mobility  Bed Mobility Overal bed mobility: Needs Assistance Bed Mobility: Supine to Sit     Supine to sit: Supervision          Transfers Overall transfer level: Needs assistance Equipment used: Rolling walker (2 wheels)               General transfer comment: supervision    Ambulation/Gait Ambulation/Gait assistance: Contact guard assist Gait Distance (Feet): 150 Feet Assistive device: Rolling walker (2 wheels) Gait Pattern/deviations:  Step-through pattern       General Gait Details: gait steady w/ RW  Stairs            Wheelchair Mobility     Tilt Bed    Modified Rankin (Stroke Patients Only)       Balance Overall balance assessment: Needs assistance Sitting-balance support: Bilateral upper extremity supported, Feet supported Sitting balance-Leahy Scale: Fair     Standing balance support: Bilateral upper extremity supported, During functional activity Standing balance-Leahy Scale: Poor                               Pertinent Vitals/Pain Pain Assessment Pain Assessment: No/denies pain    Home Living Family/patient expects to be discharged to:: Private residence Living Arrangements: Spouse/significant other Available Help at Discharge: Family Type of Home: House Home Access: Stairs to enter Entrance Stairs-Rails: Right Entrance Stairs-Number of Steps: 4   Home Layout: One level Home Equipment: Rollator (4 wheels)      Prior Function Prior Level of Function : Needs assist             Mobility Comments: usisng rollator ADLs Comments: Independent     Extremity/Trunk Assessment   Upper Extremity Assessment Upper Extremity Assessment: Overall WFL for tasks assessed    Lower Extremity Assessment Lower Extremity Assessment: Generalized weakness    Cervical / Trunk Assessment Cervical / Trunk Assessment: Normal  Communication   Communication Communication: No apparent difficulties    Cognition Arousal: Alert Behavior During Therapy: WFL for tasks assessed/performed   PT - Cognitive impairments: Orientation   Orientation impairments: Time  PT - Cognition Comments: pt. reports has been having STM difficulties, spouse answers for the patient Following commands: Intact       Cueing Cueing Techniques: Verbal cues     General Comments      Exercises     Assessment/Plan    PT Assessment Patient needs continued PT services  PT  Problem List Decreased strength;Decreased activity tolerance;Decreased knowledge of use of DME;Decreased mobility;Decreased safety awareness;Decreased cognition       PT Treatment Interventions DME instruction;Therapeutic exercise;Gait training;Functional mobility training;Cognitive remediation;Therapeutic activities    PT Goals (Current goals can be found in the Care Plan section)  Acute Rehab PT Goals Patient Stated Goal: per spouse, out ASAP PT Goal Formulation: All assessment and education complete, DC therapy    Frequency Min 2X/week     Co-evaluation               AM-PAC PT "6 Clicks" Mobility  Outcome Measure Help needed turning from your back to your side while in a flat bed without using bedrails?: None Help needed moving from lying on your back to sitting on the side of a flat bed without using bedrails?: None Help needed moving to and from a bed to a chair (including a wheelchair)?: A Little Help needed standing up from a chair using your arms (e.g., wheelchair or bedside chair)?: A Little Help needed to walk in hospital room?: A Little Help needed climbing 3-5 steps with a railing? : A Lot 6 Click Score: 19    End of Session Equipment Utilized During Treatment: Gait belt Activity Tolerance: Patient tolerated treatment well Patient left: in chair;with call bell/phone within reach;with family/visitor present;with chair alarm set Nurse Communication: Mobility status PT Visit Diagnosis: Unsteadiness on feet (R26.81);Difficulty in walking, not elsewhere classified (R26.2);Muscle weakness (generalized) (M62.81);History of falling (Z91.81)    Time: 0454-0981 PT Time Calculation (min) (ACUTE ONLY): 16 min   Charges:   PT Evaluation $PT Eval Low Complexity: 1 Low   PT General Charges $$ ACUTE PT VISIT: 1 Visit         Abelina Hoes PT Acute Rehabilitation Services Office [phone]   Dareen Ebbing 04/08/2024, 1:20 PM

## 2024-04-08 NOTE — TOC Transition Note (Signed)
 Transition of Care [facility]) - Discharge Note   Patient Details  Name: Stephanie Barber MRN: [id number] Date of Birth: Mar 15, 1941  Transition of Care [facility]) CM/SW Contact:  Jonni Nettle, LCSW Phone Number: 04/08/2024, 12:07 PM   Clinical Narrative:    CSW spoke with pt's husband, Stephanie Barber [phone], via phone call. Pt initially denied ordering rollator walker due to anxious to get pt home, unwilling to wait for delivery to hospital room prior to discharge. CSW advised rollator can be delivered to home. Husband agreeable to delivery of rollator to home. CSW ordered rollator with Jermaine at [employer]. Rollator to be delivered to home. Pt denied HH services due to upcoming family vacation. No further TOC needs at this time.   Final next level of care: Home/Self Care Barriers to Discharge: Barriers Resolved   Patient Goals and CMS Choice Patient states their goals for this hospitalization and ongoing recovery are:: To return home     Discharge Placement Home with DME  Discharge Plan and Services Additional resources added to the After Visit Summary for [employer] for DME delivery to home           DME Arranged: Walker rolling with seat DME Agency: [employer] Date DME Agency Contacted: 04/08/24 Time DME Agency Contacted: [id number] Representative spoke with at DME Agency: Jermaine at [facility] Arranged: Refused HH HH Agency: NA    Social Drivers of Health (SDOH) Interventions SDOH Screenings   Food Insecurity: No Food Insecurity (04/06/2024)  Housing: Low Risk  (04/06/2024)  Transportation Needs: No Transportation Needs (04/06/2024)  Utilities: Not At Risk (04/06/2024)  Social Connections: Socially Integrated (04/06/2024)  Tobacco Use: Medium Risk (04/06/2024)     Readmission Risk Interventions     No data to display          Le Primes, MSW, LCSW 04/08/2024 12:12 PM

## 2024-04-08 NOTE — Discharge Summary (Signed)
 Physician Discharge Summary   Patient: Stephanie Barber MRN: [id number] DOB: [birth year]  Admit date:     04/05/2024  Discharge date: 04/08/24  Discharge Physician: Bertram Brocks, MD    PCP: Sun, Vyvyan, MD   Recommendations at discharge:   Amlodipine  2.5 mg p.o. daily Protonix  40 mg p.o. daily  Discharge Diagnoses:    Acute lower GI bleeding   Hypertension   OSA on CPAP   Acute blood loss anemia   Stage 3b chronic kidney disease (CKD) [facility])    Hospital Course:  Patient is a 83 year old female with HTN, hyperlipidemia, diet-controlled diabetes mellitus, OSA, CKD stage IIIb, diverticulosis presented with rectal bleeding.  Patient reported that she was in her usual state of health, had something to eat at approximately 11 AM.  30 minutes later she reports passing maroon-colored blood in the stool she went on to have several more episodes of painless hematochezia.  No abdominal pain nausea vomiting hematemesis fevers or chills. Hemoglobin 10.5 on arrival, subsequently dropped to 8.4 and was transfused 1 unit packed RBCs Patient has a history of known diverticulosis, admitted for further workup CTA GI bleed on admission was negative   Assessment and Plan:  Acute lower GI bleeding, acute blood loss anemia in the setting of known diverticular disease - presented with active GI bleeding with multiple episodes of rectal bleeding.  Initial CTA negative.   -Previous colonoscopy 07/28/2022 showed diverticulosis in the sigmoid colon, descending colon, transverse colon, ascending colon and rectum.  Patent end-to-side colocolonic anastomosis.  EGD 06/17/2021 normal -Due to persistent GI bleeding, CTA was repeated on 5/25 which was positive for active bleeding.  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 -Received 2 units packed RBCs - Patient was also seen by gastroenterology, no  role for colonoscopy at this point  - H&H remained stable, 11.7 at discharge     Hypertension -BP readings elevated probably due to anxiety and hospitalization - Toprol -XL was resumed, amlodipine     History of GERD - Continue Protonix  40 mg daily     OSA  -on CPAP       Stage 3b chronic kidney disease (CKD) (HCC) with NAG metabolic acidosis Baseline creatinine 1.5 - presented with creatinine of 1.63, improved to 1.1 at discharge    Estimated body mass index is 22.35 kg/m as calculated from the following:   Height as of this encounter: 4\' 11"  (1.499 m).   Weight as of this encounter: 50.2 kg.        Pain control - [place]  Controlled Substance Reporting System database was reviewed. and patient was instructed, not to drive, operate heavy machinery, perform activities at heights, swimming or participation in water  activities or provide baby-sitting services while on Pain, Sleep and Anxiety Medications; until their outpatient Physician has advised to do so again. Also recommended to not to take more than prescribed Pain, Sleep and Anxiety Medications.  Consultants: Interventional urology, gastroenterology Procedures performed: Embolization Disposition: Home Diet recommendation: Soft diet  DISCHARGE MEDICATION: Allergies as of 04/08/2024       Reactions   Morphine And Codeine Anaphylaxis   Lactose Intolerance (gi) Diarrhea, Nausea Only        Medication List     TAKE these medications    acetaminophen  500 MG tablet Commonly known as: TYLENOL  Take 500 mg by mouth daily.   amLODipine  2.5 MG tablet Commonly known as: NORVASC  Take 1 tablet (2.5 mg total)  by mouth daily.   metFORMIN  500 MG tablet Commonly known as: GLUCOPHAGE  Take 500 mg by mouth daily with breakfast.   metoprolol  succinate 50 MG 24 hr tablet Commonly known as: TOPROL -XL Take 50 mg by mouth in the morning.   ondansetron  4 MG tablet Commonly known as: ZOFRAN  Take 1 tablet (4 mg total) by  mouth every 8 (eight) hours as needed for nausea or vomiting.   pantoprazole  40 MG tablet Commonly known as: Protonix  Take 1 tablet (40 mg total) by mouth daily before breakfast. What changed: when to take this               Discharge Care Instructions  (From admission, onward)           Start     Ordered   04/08/24 0000  If the dressing is still on your incision site when you go home, remove it on the third day after your surgery date. Remove dressing if it begins to fall off, or if it is dirty or damaged before the third day.        04/08/24 8469            Follow-up Information     Sun, Vyvyan, MD. Schedule an appointment as soon as possible for a visit in 2 week(s).   Specialty: Family Medicine Why: for hospital follow-up Contact information: [address] [place] [address] [phone]                Discharge Exam: Cleavon Curls Weights   04/06/24 0013  Weight: 50.2 kg   S: No further bleeding, doing well, H&H remained stable, cleared by GI to discharge home.  Husband at the bedside agrees with the plan.  BP (!) 146/66   Pulse 69   Temp 97.9 F (36.6 C) (Oral)   Resp 16   Ht 4\' 11"  (1.499 m)   Wt 50.2 kg   SpO2 99%   BMI 22.35 kg/m   Physical Exam General: Alert and oriented x 3, NAD Cardiovascular: S1 S2 clear, RRR.  Respiratory: CTAB, no wheezing, rales or rhonchi Gastrointestinal: Soft, nontender, nondistended, NBS Ext: no pedal edema bilaterally Neuro: no new deficits Psych: Normal affect    Condition at discharge: fair  The results of significant diagnostics from this hospitalization (including imaging, microbiology, ancillary and laboratory) are listed below for reference.   Imaging Studies: IR EMBO ART  VEN HEMORR LYMPH EXTRAV  INC GUIDE ROADMAPPING Result Date: 04/06/2024 INDICATION: Active GI bleed clinically with CTA today demonstrating active arterial bleed at the level of the distal terminal ileum. EXAM:  1. ULTRASOUND GUIDANCE FOR VASCULAR ACCESS OF THE RIGHT COMMON FEMORAL ARTERY 2. SELECTIVE ARTERIOGRAPHY OF THE SUPERIOR MESENTERIC ARTERY 3. ADDITIONAL SELECTIVE ARTERIOGRAPHY OF SECOND ORDER SUPERIOR MESENTERIC ARTERY BRANCH 4. ADDITIONAL SELECTIVE ARTERIOGRAPHY OF SECOND ORDER SUPERIOR MESENTERIC ARTERY BRANCH 5. ADDITIONAL SECOND ORDER ARTERIOGRAPHY OF THIRD ORDER SUPERIOR MESENTERIC ARTERY BRANCH 6. ADDITIONAL SELECTIVE ARTERIOGRAPHY OF FOURTH ORDER SUPERIOR MESENTERIC ARTERY BRANCH 7. ADDITIONAL SELECTIVE ARTERIOGRAPHY OF FIFTH ORDER ILEOCOLIC SUPERIOR MESENTERIC ARTERY BRANCH 8. ADDITIONAL SELECTIVE ARTERIOGRAPHY OF SEPARATE FIFTH ORDER ILEOCOLIC SUPERIOR MESENTERIC ARTERY BRANCH 9. TRANSCATHETER ARTERIOGRAPHY OF ILEOCOLIC SUPERIOR MESENTERIC ARTERY BRANCH MEDICATIONS: During the procedure, 100 mcg of intra-arterial nitroglycerin  was administered in a superior mesenteric artery branch. ANESTHESIA/SEDATION: Moderate (conscious) sedation was employed during this procedure. A total of Versed  2.0 mg and 12.5 mg of IV Benadryl  was administered intravenously. Moderate Sedation Time: 60 minutes. The patient's level of consciousness and vital  signs were monitored continuously by radiology nursing throughout the procedure under my direct supervision. CONTRAST:  62 mL Visipaque  320 FLUOROSCOPY TIME:  Radiation Exposure Index (as provided by the fluoroscopic device): 457 mGy Kerma COMPLICATIONS: None immediate. PROCEDURE: Informed consent was obtained from the patient following explanation of the procedure, risks, benefits and alternatives. The patient understands, agrees and consents for the procedure. All questions were addressed. A time out was performed prior to the initiation of the procedure. Maximal barrier sterile technique utilized including caps, mask, sterile gowns, sterile gloves, large sterile drape, hand hygiene, and chlorhexidine  prep. Ultrasound was used to confirm patency of the right common femoral  artery. An ultrasound image was saved and recorded. Under ultrasound guidance, access of the right common femoral artery was performed with a micropuncture set. A 5 French sheath was placed over a guidewire. A 5 French Cobra catheter was advanced over a wire into the abdominal aorta. This was used to selectively catheterize the superior mesenteric artery. The catheter was further advanced into the proximal trunk of the SMA over a guidewire. Selective arteriography of the SMA was performed. A 2.4 French microcatheter was then advanced through the 5 [place] catheter and initially used to selectively catheterize a second order branch of the superior mesenteric artery. The catheter was retracted and utilized in catheterizing an additional second order branch and selective arteriography performed. The catheter was then used to selectively catheterize a third order branch supplying distal small bowel and proximal colon. Selective arteriography was performed. The catheter was then used to selectively catheterized a fourth order ileocolic branch supplying the cecum and distal ileum and additional selective arteriography performed. Intra-arterial nitroglycerin  was administered into ileocolic supply. The microcatheter was then used to selectively catheterize a fifth order ileocolic branch and selective arteriography performed. The catheter was retracted and used to selectively catheterize an additional fifth order ileocolic branch and selective arteriography performed. Transcatheter embolization was then performed with advancement and detachment of 2 separate 2 mm x 4 cm length 0.018 inch embolization coils through the microcatheter. Additional arteriography was performed through the microcatheter. Oblique arteriography was performed at them femoral puncture site through the access sheath. Arteriotomy closure was then performed with the Angio-Seal device. FINDINGS: Initial proximal SMA arteriography did not reveal active  bleeding in the distribution of the SMA and in particular in the region of demonstrated bleeding by CTA at the level of the terminal ileum. Multiple selective catheterizations were then performed of higher order branches supplying the ileocolic distribution via a coaxial microcatheter based on location of the bleed by CTA and correlating arterial anatomy with the CTA performed earlier today. 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 Coil embolization was able to be performed of the branch at the base of the pseudoaneurysm and extending back to ileocolic supply without sacrificing significant perfusion of the cecum or terminal ileum. Completion arteriography does not demonstrate further contrast filling of the pseudoaneurysm or extravasation of contrast into the lumen of the terminal ileum. 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 This correlates exactly with the region of bleeding seen by CTA. Superselective transcatheter embolization was able to be performed via a microcatheter at  the base of the bleeding pseudoaneurysm with embolization coils resulting in successful occlusion and no further visualized flow within the pseudoaneurysm or bleeding into the terminal ileum. Electronically Signed   By: Erica Hau M.D.   On: 04/06/2024 16:18   IR US  Guide Vasc Access Right Result Date: 04/06/2024 INDICATION: Active GI bleed clinically with CTA today demonstrating active arterial bleed at the level of the distal terminal ileum. EXAM: 1. ULTRASOUND GUIDANCE FOR VASCULAR ACCESS OF THE  RIGHT COMMON FEMORAL ARTERY 2. SELECTIVE ARTERIOGRAPHY OF THE SUPERIOR MESENTERIC ARTERY 3. ADDITIONAL SELECTIVE ARTERIOGRAPHY OF SECOND ORDER SUPERIOR MESENTERIC ARTERY BRANCH 4. ADDITIONAL SELECTIVE ARTERIOGRAPHY OF SECOND ORDER SUPERIOR MESENTERIC ARTERY BRANCH 5. ADDITIONAL SECOND ORDER ARTERIOGRAPHY OF THIRD ORDER SUPERIOR MESENTERIC ARTERY BRANCH 6. ADDITIONAL SELECTIVE ARTERIOGRAPHY OF FOURTH ORDER SUPERIOR MESENTERIC ARTERY BRANCH 7. ADDITIONAL SELECTIVE ARTERIOGRAPHY OF FIFTH ORDER ILEOCOLIC SUPERIOR MESENTERIC ARTERY BRANCH 8. ADDITIONAL SELECTIVE ARTERIOGRAPHY OF SEPARATE FIFTH ORDER ILEOCOLIC SUPERIOR MESENTERIC ARTERY BRANCH 9. TRANSCATHETER ARTERIOGRAPHY OF ILEOCOLIC SUPERIOR MESENTERIC ARTERY BRANCH MEDICATIONS: During the procedure, 100 mcg of intra-arterial nitroglycerin  was administered in a superior mesenteric artery branch. ANESTHESIA/SEDATION: Moderate (conscious) sedation was employed during this procedure. A total of Versed  2.0 mg and 12.5 mg of IV Benadryl  was administered intravenously. Moderate Sedation Time: 60 minutes. The patient's level of consciousness and vital signs were monitored continuously by radiology nursing throughout the procedure under my direct supervision. CONTRAST:  62 mL Visipaque 320 FLUOROSCOPY TIME:  Radiation Exposure Index (as provided by the fluoroscopic device): 457 mGy Kerma COMPLICATIONS: None immediate. PROCEDURE: Informed consent was obtained from the patient following explanation of the procedure, risks, benefits and alternatives. The patient understands, agrees and consents for the procedure. All questions were addressed. A time out was performed prior to the initiation of the procedure. Maximal barrier sterile technique utilized including caps, mask, sterile gowns, sterile gloves, large sterile drape, hand hygiene, and chlorhexidine  prep. Ultrasound was used to confirm patency of the right common femoral artery. An ultrasound image was saved and recorded.  Under ultrasound guidance, access of the right common femoral artery was performed with a micropuncture set. A 5 French sheath was placed over a guidewire. A 5 French Cobra catheter was advanced over a wire into the abdominal aorta. This was used to selectively catheterize the superior mesenteric artery. The catheter was further advanced into the proximal trunk of the SMA over a guidewire. Selective arteriography of the SMA was performed. A 2.4 French microcatheter was then advanced through the 5 [place] catheter and initially used to selectively catheterize a second order branch of the superior mesenteric artery. The catheter was retracted and utilized in catheterizing an additional second order branch and selective arteriography performed. The catheter was then used to selectively catheterize a third order branch supplying distal small bowel and proximal colon. Selective arteriography was performed. The catheter was then used to selectively catheterized a fourth order ileocolic branch supplying the cecum and distal ileum and additional selective arteriography performed. Intra-arterial nitroglycerin  was administered into ileocolic supply. The microcatheter was then used to selectively catheterize a fifth order ileocolic branch and selective arteriography performed. The catheter was retracted and used to selectively catheterize an additional fifth order ileocolic branch and selective arteriography performed. Transcatheter embolization was then performed with advancement and detachment of 2 separate 2 mm x 4 cm length 0.018 inch embolization coils through the microcatheter. Additional arteriography was performed through the microcatheter. Oblique arteriography was performed at them femoral puncture site through the access sheath. Arteriotomy closure was then  performed with the Angio-Seal device. FINDINGS: Initial proximal SMA arteriography did not reveal active bleeding in the distribution of the SMA and in particular  in the region of demonstrated bleeding by CTA at the level of the terminal ileum. Multiple selective catheterizations were then performed of higher order branches supplying the ileocolic distribution via a coaxial microcatheter based on location of the bleed by CTA and correlating arterial anatomy with the CTA performed earlier today. 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 Coil embolization was able to be performed of the branch at the base of the pseudoaneurysm and extending back to ileocolic supply without sacrificing significant perfusion of the cecum or terminal ileum. Completion arteriography does not demonstrate further contrast filling of the pseudoaneurysm or extravasation of contrast into the lumen of the terminal ileum. 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 This correlates exactly with the region of bleeding seen by CTA. Superselective transcatheter embolization was able to be performed via a microcatheter at the base of the bleeding pseudoaneurysm with embolization coils resulting in successful occlusion and no further visualized flow within the pseudoaneurysm or bleeding into the terminal ileum. Electronically Signed   By: Erica Hau M.D.   On: 04/06/2024 16:18   IR Angiogram Visceral Selective Result Date: 04/06/2024 INDICATION: Active GI bleed clinically with CTA today demonstrating active arterial bleed at the level of the distal terminal ileum. EXAM: 1. ULTRASOUND GUIDANCE FOR VASCULAR ACCESS OF THE RIGHT COMMON FEMORAL ARTERY 2. SELECTIVE ARTERIOGRAPHY OF  THE SUPERIOR MESENTERIC ARTERY 3. ADDITIONAL SELECTIVE ARTERIOGRAPHY OF SECOND ORDER SUPERIOR MESENTERIC ARTERY BRANCH 4. ADDITIONAL SELECTIVE ARTERIOGRAPHY OF SECOND ORDER SUPERIOR MESENTERIC ARTERY BRANCH 5. ADDITIONAL SECOND ORDER ARTERIOGRAPHY OF THIRD ORDER SUPERIOR MESENTERIC ARTERY BRANCH 6. ADDITIONAL SELECTIVE ARTERIOGRAPHY OF FOURTH ORDER SUPERIOR MESENTERIC ARTERY BRANCH 7. ADDITIONAL SELECTIVE ARTERIOGRAPHY OF FIFTH ORDER ILEOCOLIC SUPERIOR MESENTERIC ARTERY BRANCH 8. ADDITIONAL SELECTIVE ARTERIOGRAPHY OF SEPARATE FIFTH ORDER ILEOCOLIC SUPERIOR MESENTERIC ARTERY BRANCH 9. TRANSCATHETER ARTERIOGRAPHY OF ILEOCOLIC SUPERIOR MESENTERIC ARTERY BRANCH MEDICATIONS: During the procedure, 100 mcg of intra-arterial nitroglycerin  was administered in a superior mesenteric artery branch. ANESTHESIA/SEDATION: Moderate (conscious) sedation was employed during this procedure. A total of Versed  2.0 mg and 12.5 mg of IV Benadryl  was administered intravenously. Moderate Sedation Time: 60 minutes. The patient's level of consciousness and vital signs were monitored continuously by radiology nursing throughout the procedure under my direct supervision. CONTRAST:  62 mL Visipaque 320 FLUOROSCOPY TIME:  Radiation Exposure Index (as provided by the fluoroscopic device): 457 mGy Kerma COMPLICATIONS: None immediate. PROCEDURE: Informed consent was obtained from the patient following explanation of the procedure, risks, benefits and alternatives. The patient understands, agrees and consents for the procedure. All questions were addressed. A time out was performed prior to the initiation of the procedure. Maximal barrier sterile technique utilized including caps, mask, sterile gowns, sterile gloves, large sterile drape, hand hygiene, and chlorhexidine  prep. Ultrasound was used to confirm patency of the right common femoral artery. An ultrasound image was saved and recorded. Under ultrasound guidance, access of the right common  femoral artery was performed with a micropuncture set. A 5 French sheath was placed over a guidewire. A 5 [place] Cobra catheter  was advanced over a wire into the abdominal aorta. This was used to selectively catheterize the superior mesenteric artery. The catheter was further advanced into the proximal trunk of the SMA over a guidewire. Selective arteriography of the SMA was performed. A 2.4 French microcatheter was then advanced through the 5 [place] catheter and initially used to selectively catheterize a second order branch of the superior mesenteric artery. The catheter was retracted and utilized in catheterizing an additional second order branch and selective arteriography performed. The catheter was then used to selectively catheterize a third order branch supplying distal small bowel and proximal colon. Selective arteriography was performed. The catheter was then used to selectively catheterized a fourth order ileocolic branch supplying the cecum and distal ileum and additional selective arteriography performed. Intra-arterial nitroglycerin  was administered into ileocolic supply. The microcatheter was then used to selectively catheterize a fifth order ileocolic branch and selective arteriography performed. The catheter was retracted and used to selectively catheterize an additional fifth order ileocolic branch and selective arteriography performed. Transcatheter embolization was then performed with advancement and detachment of 2 separate 2 mm x 4 cm length 0.018 inch embolization coils through the microcatheter. Additional arteriography was performed through the microcatheter. Oblique arteriography was performed at them femoral puncture site through the access sheath. Arteriotomy closure was then performed with the Angio-Seal device. FINDINGS: Initial proximal SMA arteriography did not reveal active bleeding in the distribution of the SMA and in particular in the region of demonstrated bleeding by CTA at the  level of the terminal ileum. Multiple selective catheterizations were then performed of higher order branches supplying the ileocolic distribution via a coaxial microcatheter based on location of the bleed by CTA and correlating arterial anatomy with the CTA performed earlier today. 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 Coil embolization was able to be performed of the branch at the base of the pseudoaneurysm and extending back to ileocolic supply without sacrificing significant perfusion of the cecum or terminal ileum. Completion arteriography does not demonstrate further contrast filling of the pseudoaneurysm or extravasation of contrast into the lumen of the terminal ileum. 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 This correlates exactly with the region of bleeding seen by CTA. Superselective transcatheter embolization was able to be performed via a microcatheter at the base of the bleeding pseudoaneurysm with embolization coils resulting in successful occlusion and no further visualized flow within the pseudoaneurysm or bleeding into the terminal ileum. Electronically Signed   By: Erica Hau M.D.   On: 04/06/2024 16:18   IR Angiogram Selective Each Additional Vessel Result Date: 04/06/2024 INDICATION: Active GI bleed clinically with CTA today demonstrating active arterial bleed at the level of the distal terminal ileum. EXAM: 1. ULTRASOUND GUIDANCE FOR VASCULAR ACCESS OF THE RIGHT COMMON FEMORAL ARTERY 2. SELECTIVE ARTERIOGRAPHY OF THE SUPERIOR MESENTERIC ARTERY 3.  ADDITIONAL SELECTIVE ARTERIOGRAPHY OF SECOND ORDER SUPERIOR MESENTERIC ARTERY BRANCH 4. ADDITIONAL SELECTIVE ARTERIOGRAPHY OF SECOND ORDER SUPERIOR MESENTERIC ARTERY BRANCH 5. ADDITIONAL SECOND ORDER ARTERIOGRAPHY OF THIRD ORDER SUPERIOR MESENTERIC ARTERY BRANCH 6. ADDITIONAL SELECTIVE ARTERIOGRAPHY OF FOURTH ORDER SUPERIOR MESENTERIC ARTERY BRANCH 7. ADDITIONAL SELECTIVE ARTERIOGRAPHY OF FIFTH ORDER ILEOCOLIC SUPERIOR MESENTERIC ARTERY  BRANCH 8. ADDITIONAL SELECTIVE ARTERIOGRAPHY OF SEPARATE FIFTH ORDER ILEOCOLIC SUPERIOR MESENTERIC ARTERY BRANCH 9. TRANSCATHETER ARTERIOGRAPHY OF ILEOCOLIC SUPERIOR MESENTERIC ARTERY BRANCH MEDICATIONS: During the procedure, 100 mcg of intra-arterial nitroglycerin  was administered in a superior mesenteric artery branch. ANESTHESIA/SEDATION: Moderate (conscious) sedation was employed during this procedure. A total of Versed  2.0 mg and 12.5 mg of IV Benadryl  was administered intravenously. Moderate Sedation Time: 60 minutes. The patient's level of consciousness and vital signs were monitored continuously by radiology nursing throughout the procedure under my direct supervision. CONTRAST:  62 mL Visipaque 320 FLUOROSCOPY TIME:  Radiation Exposure Index (as provided by the fluoroscopic device): 457 mGy Kerma COMPLICATIONS: None immediate. PROCEDURE: Informed consent was obtained from the patient following explanation of the procedure, risks, benefits and alternatives. The patient understands, agrees and consents for the procedure. All questions were addressed. A time out was performed prior to the initiation of the procedure. Maximal barrier sterile technique utilized including caps, mask, sterile gowns, sterile gloves, large sterile drape, hand hygiene, and chlorhexidine  prep. Ultrasound was used to confirm patency of the right common femoral artery. An ultrasound image was saved and recorded. Under ultrasound guidance, access of the right common femoral artery was performed with a  micropuncture set. A 5 French sheath was placed over a guidewire. A 5 French Cobra catheter was advanced over a wire into the abdominal aorta. This was used to selectively catheterize the superior mesenteric artery. The catheter was further advanced into the proximal trunk of the SMA over a guidewire. Selective arteriography of the SMA was performed. A 2.4 French microcatheter was then advanced through the 5 [place] catheter and initially used to selectively catheterize a second order branch of the superior mesenteric artery. The catheter was retracted and utilized in catheterizing an additional second order branch and selective arteriography performed. The catheter was then used to selectively catheterize a third order branch supplying distal small bowel and proximal colon. Selective arteriography was performed. The catheter was then used to selectively catheterized a fourth order ileocolic branch supplying the cecum and distal ileum and additional selective arteriography performed. Intra-arterial nitroglycerin  was administered into ileocolic supply. The microcatheter was then used to selectively catheterize a fifth order ileocolic branch and selective arteriography performed. The catheter was retracted and used to selectively catheterize an additional fifth order ileocolic branch and selective arteriography performed. Transcatheter embolization was then performed with advancement and detachment of 2 separate 2 mm x 4 cm length 0.018 inch embolization coils through the microcatheter. Additional arteriography was performed through the microcatheter. Oblique arteriography was performed at them femoral puncture site through the access sheath. Arteriotomy closure was then performed with the Angio-Seal device. FINDINGS: Initial proximal SMA arteriography did not reveal active bleeding in the distribution of the SMA and in particular in the region of demonstrated bleeding by CTA at the level of the terminal ileum.  Multiple selective catheterizations were then performed of higher order branches supplying the ileocolic distribution via a coaxial microcatheter based on location of the bleed by CTA and correlating arterial anatomy with the CTA performed earlier today. 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 Coil embolization was able to be performed of the branch at the base of the pseudoaneurysm and extending back to ileocolic supply without sacrificing significant perfusion of the cecum or terminal ileum. Completion  arteriography does not demonstrate further contrast filling of the pseudoaneurysm or extravasation of contrast into the lumen of the terminal ileum. 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 This correlates exactly with the region of bleeding seen by CTA. Superselective transcatheter embolization was able to be performed via a microcatheter at the base of the bleeding pseudoaneurysm with embolization coils resulting in successful occlusion and no further visualized flow within the pseudoaneurysm or bleeding into the terminal ileum. Electronically Signed   By: Erica Hau M.D.   On: 04/06/2024 16:18   IR Angiogram Selective Each Additional Vessel Result Date: 04/06/2024 INDICATION: Active GI bleed clinically with CTA today demonstrating active arterial bleed at the level of the distal terminal ileum. EXAM: 1. ULTRASOUND GUIDANCE FOR VASCULAR ACCESS OF THE RIGHT COMMON FEMORAL ARTERY 2. SELECTIVE ARTERIOGRAPHY OF THE SUPERIOR MESENTERIC ARTERY 3. ADDITIONAL SELECTIVE ARTERIOGRAPHY  OF SECOND ORDER SUPERIOR MESENTERIC ARTERY BRANCH 4. ADDITIONAL SELECTIVE ARTERIOGRAPHY OF SECOND ORDER SUPERIOR MESENTERIC ARTERY BRANCH 5. ADDITIONAL SECOND ORDER ARTERIOGRAPHY OF THIRD ORDER SUPERIOR MESENTERIC ARTERY BRANCH 6. ADDITIONAL SELECTIVE ARTERIOGRAPHY OF FOURTH ORDER SUPERIOR MESENTERIC ARTERY BRANCH 7. ADDITIONAL SELECTIVE ARTERIOGRAPHY OF FIFTH ORDER ILEOCOLIC SUPERIOR MESENTERIC ARTERY BRANCH 8. ADDITIONAL SELECTIVE ARTERIOGRAPHY OF SEPARATE FIFTH ORDER ILEOCOLIC SUPERIOR MESENTERIC ARTERY BRANCH 9. TRANSCATHETER ARTERIOGRAPHY OF ILEOCOLIC SUPERIOR MESENTERIC ARTERY BRANCH MEDICATIONS: During the procedure, 100 mcg of intra-arterial nitroglycerin  was administered in a superior mesenteric artery branch. ANESTHESIA/SEDATION: Moderate (conscious) sedation was employed during this procedure. A total of Versed  2.0 mg and 12.5 mg of IV Benadryl  was administered intravenously. Moderate Sedation Time: 60 minutes. The patient's level of consciousness and vital signs were monitored continuously by radiology nursing throughout the procedure under my direct supervision. CONTRAST:  62 mL Visipaque 320 FLUOROSCOPY TIME:  Radiation Exposure Index (as provided by the fluoroscopic device): 457 mGy Kerma COMPLICATIONS: None immediate. PROCEDURE: Informed consent was obtained from the patient following explanation of the procedure, risks, benefits and alternatives. The patient understands, agrees and consents for the procedure. All questions were addressed. A time out was performed prior to the initiation of the procedure. Maximal barrier sterile technique utilized including caps, mask, sterile gowns, sterile gloves, large sterile drape, hand hygiene, and chlorhexidine  prep. Ultrasound was used to confirm patency of the right common femoral artery. An ultrasound image was saved and recorded. Under ultrasound guidance, access of the right common femoral artery was performed with a micropuncture set. A 5 French sheath  was placed over a guidewire. A 5 French Cobra catheter was advanced over a wire into the abdominal aorta. This was used to selectively catheterize the superior mesenteric artery. The catheter was further advanced into the proximal trunk of the SMA over a guidewire. Selective arteriography of the SMA was performed. A 2.4 French microcatheter was then advanced through the 5 [place] catheter and initially used to selectively catheterize a second order branch of the superior mesenteric artery. The catheter was retracted and utilized in catheterizing an additional second order branch and selective arteriography performed. The catheter was then used to selectively catheterize a third order branch supplying distal small bowel and proximal colon. Selective arteriography was performed. The catheter was then used to selectively catheterized a fourth order ileocolic branch supplying the cecum and distal ileum and additional selective arteriography performed. Intra-arterial nitroglycerin  was administered into ileocolic supply. The microcatheter was then used to selectively catheterize a  fifth order ileocolic branch and selective arteriography performed. The catheter was retracted and used to selectively catheterize an additional fifth order ileocolic branch and selective arteriography performed. Transcatheter embolization was then performed with advancement and detachment of 2 separate 2 mm x 4 cm length 0.018 inch embolization coils through the microcatheter. Additional arteriography was performed through the microcatheter. Oblique arteriography was performed at them femoral puncture site through the access sheath. Arteriotomy closure was then performed with the Angio-Seal device. FINDINGS: Initial proximal SMA arteriography did not reveal active bleeding in the distribution of the SMA and in particular in the region of demonstrated bleeding by CTA at the level of the terminal ileum. Multiple selective catheterizations were  then performed of higher order branches supplying the ileocolic distribution via a coaxial microcatheter based on location of the bleed by CTA and correlating arterial anatomy with the CTA performed earlier today. 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 Coil embolization was able to be performed of the branch at the base of the pseudoaneurysm and extending back to ileocolic supply without sacrificing significant perfusion of the cecum or terminal ileum. Completion arteriography does not demonstrate further contrast filling of the pseudoaneurysm or extravasation of contrast into the lumen of the terminal ileum. 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 This correlates exactly with the region of bleeding seen by CTA. Superselective transcatheter embolization was able to be performed via a microcatheter at the base of the bleeding pseudoaneurysm with embolization coils resulting in successful occlusion and no further visualized flow within the pseudoaneurysm or bleeding into the terminal ileum. Electronically Signed   By: Erica Hau M.D.   On: 04/06/2024 16:18   IR Angiogram Selective Each Additional Vessel Result Date: 04/06/2024 INDICATION: Active GI bleed clinically with CTA today demonstrating active arterial bleed at the level of the distal terminal ileum. EXAM: 1. ULTRASOUND GUIDANCE FOR VASCULAR ACCESS OF THE RIGHT COMMON FEMORAL ARTERY 2. SELECTIVE ARTERIOGRAPHY OF THE SUPERIOR MESENTERIC ARTERY 3. ADDITIONAL SELECTIVE ARTERIOGRAPHY OF SECOND ORDER SUPERIOR MESENTERIC ARTERY  BRANCH 4. ADDITIONAL SELECTIVE ARTERIOGRAPHY OF SECOND ORDER SUPERIOR MESENTERIC ARTERY BRANCH 5. ADDITIONAL SECOND ORDER ARTERIOGRAPHY OF THIRD ORDER SUPERIOR MESENTERIC ARTERY BRANCH 6. ADDITIONAL SELECTIVE ARTERIOGRAPHY OF FOURTH ORDER SUPERIOR MESENTERIC ARTERY BRANCH 7. ADDITIONAL SELECTIVE ARTERIOGRAPHY OF FIFTH ORDER ILEOCOLIC SUPERIOR MESENTERIC ARTERY BRANCH 8. ADDITIONAL SELECTIVE ARTERIOGRAPHY OF SEPARATE FIFTH ORDER ILEOCOLIC SUPERIOR MESENTERIC ARTERY BRANCH 9. TRANSCATHETER ARTERIOGRAPHY OF ILEOCOLIC SUPERIOR MESENTERIC ARTERY BRANCH MEDICATIONS: During the procedure, 100 mcg of intra-arterial nitroglycerin  was administered in a superior mesenteric artery branch. ANESTHESIA/SEDATION: Moderate (conscious) sedation was employed during this procedure. A total of Versed  2.0 mg and 12.5 mg of IV Benadryl  was administered intravenously. Moderate Sedation Time: 60 minutes. The patient's level of consciousness and vital signs were monitored continuously by radiology nursing throughout the procedure under my direct supervision. CONTRAST:  62 mL Visipaque 320 FLUOROSCOPY TIME:  Radiation Exposure Index (as provided by the fluoroscopic device): 457 mGy Kerma COMPLICATIONS: None immediate. PROCEDURE: Informed consent was obtained from the patient following explanation of the procedure, risks, benefits and alternatives. The patient understands, agrees and consents for the procedure. All questions were addressed. A time out was  performed prior to the initiation of the procedure. Maximal barrier sterile technique utilized including caps, mask, sterile gowns, sterile gloves, large sterile drape, hand hygiene, and chlorhexidine  prep. Ultrasound was used to confirm patency of the right common femoral artery. An ultrasound image was saved and recorded. Under ultrasound guidance, access of the right common femoral artery was performed with a micropuncture set. A 5 French sheath was placed over a guidewire. A 5 French  Cobra catheter was advanced over a wire into the abdominal aorta. This was used to selectively catheterize the superior mesenteric artery. The catheter was further advanced into the proximal trunk of the SMA over a guidewire. Selective arteriography of the SMA was performed. A 2.4 French microcatheter was then advanced through the 5 [place] catheter and initially used to selectively catheterize a second order branch of the superior mesenteric artery. The catheter was retracted and utilized in catheterizing an additional second order branch and selective arteriography performed. The catheter was then used to selectively catheterize a third order branch supplying distal small bowel and proximal colon. Selective arteriography was performed. The catheter was then used to selectively catheterized a fourth order ileocolic branch supplying the cecum and distal ileum and additional selective arteriography performed. Intra-arterial nitroglycerin  was administered into ileocolic supply. The microcatheter was then used to selectively catheterize a fifth order ileocolic branch and selective arteriography performed. The catheter was retracted and used to selectively catheterize an additional fifth order ileocolic branch and selective arteriography performed. Transcatheter embolization was then performed with advancement and detachment of 2 separate 2 mm x 4 cm length 0.018 inch embolization coils through the microcatheter. Additional arteriography was performed through the microcatheter. Oblique arteriography was performed at them femoral puncture site through the access sheath. Arteriotomy closure was then performed with the Angio-Seal device. FINDINGS: Initial proximal SMA arteriography did not reveal active bleeding in the distribution of the SMA and in particular in the region of demonstrated bleeding by CTA at the level of the terminal ileum. Multiple selective catheterizations were then performed of higher order branches  supplying the ileocolic distribution via a coaxial microcatheter based on location of the bleed by CTA and correlating arterial anatomy with the CTA performed earlier today. 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 Coil embolization was able to be performed of the branch at the base of the pseudoaneurysm and extending back to ileocolic supply without sacrificing significant perfusion of the cecum or terminal ileum. Completion arteriography does not demonstrate further contrast filling of the pseudoaneurysm or extravasation of contrast into the lumen of the terminal ileum. 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 This correlates exactly with the region of bleeding seen by CTA. Superselective transcatheter embolization was able to be performed via a microcatheter at the base of the bleeding pseudoaneurysm with embolization coils resulting in successful occlusion and no further visualized flow within the pseudoaneurysm or bleeding into the terminal ileum. Electronically Signed   By: Erica Hau M.D.   On: 04/06/2024 16:18   IR Angiogram Selective Each Additional Vessel Result Date: 04/06/2024 INDICATION: Active GI bleed clinically with CTA today demonstrating active arterial bleed at the level of the distal terminal ileum.  EXAM: 1. ULTRASOUND GUIDANCE FOR VASCULAR ACCESS OF THE RIGHT COMMON FEMORAL ARTERY 2. SELECTIVE ARTERIOGRAPHY OF THE SUPERIOR MESENTERIC ARTERY 3. ADDITIONAL SELECTIVE ARTERIOGRAPHY OF SECOND ORDER SUPERIOR MESENTERIC ARTERY BRANCH 4. ADDITIONAL SELECTIVE  ARTERIOGRAPHY OF SECOND ORDER SUPERIOR MESENTERIC ARTERY BRANCH 5. ADDITIONAL SECOND ORDER ARTERIOGRAPHY OF THIRD ORDER SUPERIOR MESENTERIC ARTERY BRANCH 6. ADDITIONAL SELECTIVE ARTERIOGRAPHY OF FOURTH ORDER SUPERIOR MESENTERIC ARTERY BRANCH 7. ADDITIONAL SELECTIVE ARTERIOGRAPHY OF FIFTH ORDER ILEOCOLIC SUPERIOR MESENTERIC ARTERY BRANCH 8. ADDITIONAL SELECTIVE ARTERIOGRAPHY OF SEPARATE FIFTH ORDER ILEOCOLIC SUPERIOR MESENTERIC ARTERY BRANCH 9. TRANSCATHETER ARTERIOGRAPHY OF ILEOCOLIC SUPERIOR MESENTERIC ARTERY BRANCH MEDICATIONS: During the procedure, 100 mcg of intra-arterial nitroglycerin  was administered in a superior mesenteric artery branch. ANESTHESIA/SEDATION: Moderate (conscious) sedation was employed during this procedure. A total of Versed  2.0 mg and 12.5 mg of IV Benadryl  was administered intravenously. Moderate Sedation Time: 60 minutes. The patient's level of consciousness and vital signs were monitored continuously by radiology nursing throughout the procedure under my direct supervision. CONTRAST:  62 mL Visipaque 320 FLUOROSCOPY TIME:  Radiation Exposure Index (as provided by the fluoroscopic device): 457 mGy Kerma COMPLICATIONS: None immediate. PROCEDURE: Informed consent was obtained from the patient following explanation of the procedure, risks, benefits and alternatives. The patient understands, agrees and consents for the procedure. All questions were addressed. A time out was performed prior to the initiation of the procedure. Maximal barrier sterile technique utilized including caps, mask, sterile gowns, sterile gloves, large sterile drape, hand hygiene, and chlorhexidine  prep. Ultrasound was used to confirm patency of the right common femoral artery. An ultrasound image was saved and recorded. Under ultrasound guidance, access of the right common femoral artery was performed with a micropuncture set. A 5 French sheath was placed over a guidewire. A 5 French Cobra catheter was advanced over a  wire into the abdominal aorta. This was used to selectively catheterize the superior mesenteric artery. The catheter was further advanced into the proximal trunk of the SMA over a guidewire. Selective arteriography of the SMA was performed. A 2.4 French microcatheter was then advanced through the 5 [place] catheter and initially used to selectively catheterize a second order branch of the superior mesenteric artery. The catheter was retracted and utilized in catheterizing an additional second order branch and selective arteriography performed. The catheter was then used to selectively catheterize a third order branch supplying distal small bowel and proximal colon. Selective arteriography was performed. The catheter was then used to selectively catheterized a fourth order ileocolic branch supplying the cecum and distal ileum and additional selective arteriography performed. Intra-arterial nitroglycerin  was administered into ileocolic supply. The microcatheter was then used to selectively catheterize a fifth order ileocolic branch and selective arteriography performed. The catheter was retracted and used to selectively catheterize an additional fifth order ileocolic branch and selective arteriography performed. Transcatheter embolization was then performed with advancement and detachment of 2 separate 2 mm x 4 cm length 0.018 inch embolization coils through the microcatheter. Additional arteriography was performed through the microcatheter. Oblique arteriography was performed at them femoral puncture site through the access sheath. Arteriotomy closure was then performed with the Angio-Seal device. FINDINGS: Initial proximal SMA arteriography did not reveal active bleeding in the distribution of the SMA and in particular in the region of demonstrated bleeding by CTA at the level of the terminal ileum. Multiple selective catheterizations were then performed of higher order branches supplying the ileocolic distribution  via a coaxial microcatheter based on location of the bleed by CTA and correlating arterial anatomy with the  CTA performed earlier today. 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 Coil embolization was able to be performed of the branch at the base of the pseudoaneurysm and extending back to ileocolic supply without sacrificing significant perfusion of the cecum or terminal ileum. Completion arteriography does not demonstrate further contrast filling of the pseudoaneurysm or extravasation of contrast into the lumen of the terminal ileum. 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 This correlates exactly with the region of bleeding seen by CTA. Superselective transcatheter embolization was able to be performed via a microcatheter at the base of the bleeding pseudoaneurysm with embolization coils resulting in successful occlusion and no further visualized flow within the pseudoaneurysm or bleeding into the terminal ileum. Electronically Signed   By: Erica Hau M.D.   On: 04/06/2024 16:18   IR Angiogram Selective Each Additional Vessel Result Date: 04/06/2024 INDICATION: Active GI bleed clinically with CTA today demonstrating active arterial bleed at the level of the distal terminal ileum. EXAM: 1. ULTRASOUND GUIDANCE FOR VASCULAR ACCESS OF THE RIGHT COMMON FEMORAL ARTERY 2. SELECTIVE ARTERIOGRAPHY OF THE SUPERIOR MESENTERIC ARTERY 3. ADDITIONAL SELECTIVE ARTERIOGRAPHY OF SECOND ORDER SUPERIOR MESENTERIC ARTERY BRANCH 4. ADDITIONAL SELECTIVE ARTERIOGRAPHY OF SECOND ORDER SUPERIOR  MESENTERIC ARTERY BRANCH 5. ADDITIONAL SECOND ORDER ARTERIOGRAPHY OF THIRD ORDER SUPERIOR MESENTERIC ARTERY BRANCH 6. ADDITIONAL SELECTIVE ARTERIOGRAPHY OF FOURTH ORDER SUPERIOR MESENTERIC ARTERY BRANCH 7. ADDITIONAL SELECTIVE ARTERIOGRAPHY OF FIFTH ORDER ILEOCOLIC SUPERIOR MESENTERIC ARTERY BRANCH 8. ADDITIONAL SELECTIVE ARTERIOGRAPHY OF SEPARATE FIFTH ORDER ILEOCOLIC SUPERIOR MESENTERIC ARTERY BRANCH 9. TRANSCATHETER ARTERIOGRAPHY OF ILEOCOLIC SUPERIOR MESENTERIC ARTERY BRANCH MEDICATIONS: During the procedure, 100 mcg of intra-arterial nitroglycerin  was administered in a superior mesenteric artery branch. ANESTHESIA/SEDATION: Moderate (conscious) sedation was employed during this procedure. A total of Versed  2.0 mg and 12.5 mg of IV Benadryl  was administered intravenously. Moderate Sedation Time: 60 minutes. The patient's level of consciousness and vital signs were monitored continuously by radiology nursing throughout the procedure under my direct supervision. CONTRAST:  62 mL Visipaque 320 FLUOROSCOPY TIME:  Radiation Exposure Index (as provided by the fluoroscopic device): 457 mGy Kerma COMPLICATIONS: None immediate. PROCEDURE: Informed consent was obtained from the patient following explanation of the procedure, risks, benefits and alternatives. The patient understands, agrees and consents for the procedure. All questions were addressed. A time out was performed prior to the initiation of the procedure. Maximal barrier sterile technique utilized including caps, mask, sterile gowns, sterile gloves, large sterile drape, hand hygiene, and chlorhexidine  prep. Ultrasound was used to confirm patency of the right common femoral artery. An ultrasound image was saved and recorded. Under ultrasound guidance, access of the right common femoral artery was performed with a micropuncture set. A 5 French sheath was placed over a guidewire. A 5 French Cobra catheter was advanced over a wire into the abdominal aorta. This was  used to selectively catheterize the superior mesenteric artery. The catheter was further advanced into the proximal trunk of the SMA over a guidewire. Selective arteriography of the SMA was performed. A 2.4 French microcatheter was then advanced through the 5 [place] catheter and initially used to selectively catheterize a second order branch of the superior mesenteric artery. The catheter  was retracted and utilized in catheterizing an additional second order branch and selective arteriography performed. The catheter was then used to selectively catheterize a third order branch supplying distal small bowel and proximal colon. Selective arteriography was performed. The catheter was then used to selectively catheterized a fourth order ileocolic branch supplying the cecum and distal ileum and additional selective arteriography performed. Intra-arterial nitroglycerin  was administered into ileocolic supply. The microcatheter was then used to selectively catheterize a fifth order ileocolic branch and selective arteriography performed. The catheter was retracted and used to selectively catheterize an additional fifth order ileocolic branch and selective arteriography performed. Transcatheter embolization was then performed with advancement and detachment of 2 separate 2 mm x 4 cm length 0.018 inch embolization coils through the microcatheter. Additional arteriography was performed through the microcatheter. Oblique arteriography was performed at them femoral puncture site through the access sheath. Arteriotomy closure was then performed with the Angio-Seal device. FINDINGS: Initial proximal SMA arteriography did not reveal active bleeding in the distribution of the SMA and in particular in the region of demonstrated bleeding by CTA at the level of the terminal ileum. Multiple selective catheterizations were then performed of higher order branches supplying the ileocolic distribution via a coaxial microcatheter based on  location of the bleed by CTA and correlating arterial anatomy with the CTA performed earlier today. 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 Coil embolization was able to be performed of the branch at the base of the pseudoaneurysm and extending back to ileocolic supply without sacrificing significant perfusion of the cecum or terminal ileum. Completion arteriography does not demonstrate further contrast filling of the pseudoaneurysm or extravasation of contrast into the lumen of the terminal ileum. 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 This correlates exactly with the region of bleeding seen by CTA. Superselective transcatheter embolization was able to be performed via a microcatheter at the base of the bleeding pseudoaneurysm with embolization coils resulting in successful occlusion and no further visualized flow within the pseudoaneurysm or bleeding into the terminal ileum. Electronically Signed   By: Erica Hau M.D.   On: 04/06/2024 16:18   IR Angiogram Selective Each Additional Vessel Result Date: 04/06/2024 INDICATION: Active GI bleed clinically with CTA today demonstrating active arterial bleed at the level of the distal terminal ileum. EXAM: 1. ULTRASOUND GUIDANCE FOR VASCULAR ACCESS OF THE RIGHT COMMON FEMORAL ARTERY 2. SELECTIVE ARTERIOGRAPHY OF THE SUPERIOR MESENTERIC ARTERY 3. ADDITIONAL SELECTIVE ARTERIOGRAPHY OF SECOND ORDER SUPERIOR MESENTERIC ARTERY BRANCH 4. ADDITIONAL SELECTIVE ARTERIOGRAPHY OF SECOND ORDER SUPERIOR MESENTERIC ARTERY BRANCH 5. ADDITIONAL  SECOND ORDER ARTERIOGRAPHY OF THIRD ORDER SUPERIOR MESENTERIC ARTERY BRANCH 6. ADDITIONAL SELECTIVE ARTERIOGRAPHY OF FOURTH ORDER SUPERIOR MESENTERIC ARTERY BRANCH 7. ADDITIONAL SELECTIVE ARTERIOGRAPHY OF FIFTH ORDER ILEOCOLIC SUPERIOR MESENTERIC ARTERY BRANCH 8. ADDITIONAL SELECTIVE ARTERIOGRAPHY OF SEPARATE FIFTH ORDER ILEOCOLIC SUPERIOR MESENTERIC ARTERY BRANCH 9. TRANSCATHETER ARTERIOGRAPHY OF ILEOCOLIC SUPERIOR MESENTERIC ARTERY BRANCH MEDICATIONS: During the procedure, 100 mcg of intra-arterial nitroglycerin  was administered in a superior mesenteric artery branch. ANESTHESIA/SEDATION: Moderate (conscious) sedation was employed during this procedure. A total of Versed  2.0 mg and 12.5 mg of IV Benadryl  was administered intravenously. Moderate Sedation Time: 60 minutes. The  patient's level of consciousness and vital signs were monitored continuously by radiology nursing throughout the procedure under my direct supervision. CONTRAST:  62 mL Visipaque 320 FLUOROSCOPY TIME:  Radiation Exposure Index (as provided by the fluoroscopic device): 457 mGy Kerma COMPLICATIONS: None immediate. PROCEDURE: Informed consent was obtained from the patient following explanation of the procedure, risks, benefits and alternatives. The patient understands, agrees and consents for the procedure. All questions were addressed. A time out was performed prior to the initiation of the procedure. Maximal barrier sterile technique utilized including caps, mask, sterile gowns, sterile gloves, large sterile drape, hand hygiene, and chlorhexidine  prep. Ultrasound was used to confirm patency of the right common femoral artery. An ultrasound image was saved and recorded. Under ultrasound guidance, access of the right common femoral artery was performed with a micropuncture set. A 5 French sheath was placed over a guidewire. A 5 French Cobra catheter was advanced over a wire into the abdominal aorta. This was used to selectively catheterize the  superior mesenteric artery. The catheter was further advanced into the proximal trunk of the SMA over a guidewire. Selective arteriography of the SMA was performed. A 2.4 French microcatheter was then advanced through the 5 [place] catheter and initially used to selectively catheterize a second order branch of the superior mesenteric artery. The catheter was retracted and utilized in catheterizing an additional second order branch and selective arteriography performed. The catheter was then used to selectively catheterize a third order branch supplying distal small bowel and proximal colon. Selective arteriography was performed. The catheter was then used to selectively catheterized a fourth order ileocolic branch supplying the cecum and distal ileum and additional selective arteriography performed. Intra-arterial nitroglycerin  was administered into ileocolic supply. The microcatheter was then used to selectively catheterize a fifth order ileocolic branch and selective arteriography performed. The catheter was retracted and used to selectively catheterize an additional fifth order ileocolic branch and selective arteriography performed. Transcatheter embolization was then performed with advancement and detachment of 2 separate 2 mm x 4 cm length 0.018 inch embolization coils through the microcatheter. Additional arteriography was performed through the microcatheter. Oblique arteriography was performed at them femoral puncture site through the access sheath. Arteriotomy closure was then performed with the Angio-Seal device. FINDINGS: Initial proximal SMA arteriography did not reveal active bleeding in the distribution of the SMA and in particular in the region of demonstrated bleeding by CTA at the level of the terminal ileum. Multiple selective catheterizations were then performed of higher order branches supplying the ileocolic distribution via a coaxial microcatheter based on location of the bleed by CTA and  correlating arterial anatomy with the CTA performed earlier today. 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 Coil embolization was able to be performed of the branch at the base of the pseudoaneurysm and extending back to ileocolic supply without sacrificing significant perfusion of the cecum or terminal ileum. Completion arteriography does not demonstrate further contrast filling of the pseudoaneurysm or extravasation of contrast into the lumen of the terminal ileum. 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 This correlates exactly with the region of bleeding seen by CTA. Superselective transcatheter embolization was able to  be performed via a microcatheter at the base of the bleeding pseudoaneurysm with embolization coils resulting in successful occlusion and no further visualized flow within the pseudoaneurysm or bleeding into the terminal ileum. Electronically Signed   By: Erica Hau M.D.   On: 04/06/2024 16:18   CT ANGIO GI BLEED Result Date: 04/06/2024 CLINICAL DATA:  Mesenteric ischemia. Active GI bleed. Diverticulosis. EXAM: CTA ABDOMEN AND PELVIS WITHOUT AND WITH CONTRAST TECHNIQUE: Multidetector CT imaging of the abdomen and pelvis was performed using the standard protocol during bolus administration of intravenous contrast. Multiplanar reconstructed images and MIPs were obtained and reviewed to evaluate the vascular anatomy. RADIATION DOSE REDUCTION: This exam was performed according to the departmental dose-optimization program which includes automated exposure control,  adjustment of the mA and/or kV according to patient size and/or use of iterative reconstruction technique. CONTRAST:  OMNIPAQUE  IOHEXOL  350 MG/ML SOLN COMPARISON:  04/05/2024. FINDINGS: VASCULAR Aorta: Normal caliber aorta without aneurysm, dissection, vasculitis or significant stenosis. There are atheromatous calcifications. Celiac: Patent without evidence of aneurysm, dissection, vasculitis or significant stenosis. SMA: Patent without evidence of aneurysm, dissection, vasculitis or significant stenosis. Renals: Both renal arteries are patent without evidence of aneurysm, dissection, vasculitis, fibromuscular dysplasia or significant stenosis. IMA: Patent without evidence of aneurysm, dissection, vasculitis or significant stenosis. Inflow: Patent without evidence of aneurysm, dissection, vasculitis or significant stenosis. Proximal Outflow: Bilateral common femoral and visualized portions of the superficial and profunda femoral arteries are patent without evidence of aneurysm, dissection, vasculitis or significant stenosis. Veins: No obvious venous abnormality within the limitations of this arterial phase study. Review of the MIP images confirms the above findings. NON-VASCULAR Lower chest: No acute abnormality. No pericardial or pleural effusions. Hepatobiliary: No focal liver abnormality is seen. No biliary ductal dilatation. Gallbladder contains excreted contrast. No pericholecystic inflammatory changes. Pancreas: Unremarkable. No pancreatic ductal dilatation or surrounding inflammatory changes. Spleen: Normal in size without focal abnormality. Adrenals/Urinary Tract: 1 cm stone left kidney. No hydronephrosis. 7 mm stone right kidney. Bilateral subcentimeter renal cysts. No hydronephrosis. Unremarkable urinary bladder containing excreted IV contrast from the prior study. Stomach/Bowel: There is diffuse colonic diverticulosis. No bowel dilatation to suggest obstruction. There is evidence of extravasated IV  contrast in the terminal ileum on the early postcontrast images which represents a change from yesterday's examination, consistent with an active GI bleed. Rectosigmoid anastomosis. No bowel dilatation to suggest obstruction. Lymphatic: Aortic atherosclerosis. No enlarged abdominal or pelvic lymph nodes. Reproductive: Status post hysterectomy. No adnexal masses. Other: No abdominal wall hernia or abnormality. No abdominopelvic ascites. Musculoskeletal: Lumbosacral degenerative changes. IMPRESSION: 1. Active GI bleed in the terminal ileum. 2. Diverticulosis. 3. Bilateral nephrolithiasis. 4. Aortic atherosclerosis (ICD10-I70.0). Electronically Signed   By: Sydell Eva M.D.   On: 04/06/2024 11:17   CT ANGIO GI BLEED Result Date: 04/05/2024 CLINICAL DATA:  Lower GI bleed, bright red blood EXAM: CTA ABDOMEN AND PELVIS WITHOUT AND WITH CONTRAST TECHNIQUE: Multidetector CT imaging of the abdomen and pelvis was performed using the standard protocol during bolus administration of intravenous contrast. Multiplanar reconstructed images and MIPs were obtained and reviewed to evaluate the vascular anatomy. RADIATION DOSE REDUCTION: This exam was performed according to the departmental dose-optimization program which includes automated exposure control, adjustment of the mA and/or kV according to patient size and/or use of iterative reconstruction technique. CONTRAST:  75mL OMNIPAQUE  IOHEXOL  350 MG/ML SOLN COMPARISON:  CT abdomen pelvis earlier today FINDINGS: VASCULAR Calcified atherosclerotic plaque in the aorta and its mesenteric, renal, and iliac artery branches without  hemodynamically significant stenosis, aneurysm, or dissection. Review of the MIP images confirms the above findings. NON-VASCULAR Lower chest: No acute abnormality. Hepatobiliary: No acute abnormality. Hypoattenuating lesions in the left hepatic lobe are too small to definitively characterize but likely represent benign cysts. Pancreas: Unremarkable.  Spleen: Unremarkable. Adrenals/Urinary Tract: Normal adrenal glands. Nonobstructing by rough lateral nephrolithiasis. No urinary calculi or hydronephrosis. Unremarkable bladder. Stomach/Bowel: Stomach is within normal limits. No bowel obstruction. Colonic diverticulosis without evidence of diverticulitis. Question mild wall thickening and mucosal hyperenhancement of the ascending, transverse, and descending colon. No adjacent inflammatory stranding or fluid. No evidence of active GI bleeding. Postoperative change about the sigmoid colon. Normal appendix. Lymphatic: No lymphadenopathy. Reproductive: Hysterectomy. Other: No free intraperitoneal fluid or air. Musculoskeletal: No acute fracture. IMPRESSION: 1. Question mild colitis of the ascending, transverse, and descending colon. No evidence of active GI bleeding. 2. Colonic diverticulosis. 3. Aortic Atherosclerosis (ICD10-I70.0). Electronically Signed   By: Rozell Cornet M.D.   On: 04/05/2024 22:23   CT ABDOMEN PELVIS WO CONTRAST Result Date: 04/05/2024 CLINICAL DATA:  Blood in stool. GI bleed with need for transfusion 1 year ago. Abdominal pain. EXAM: CT ABDOMEN AND PELVIS WITHOUT CONTRAST TECHNIQUE: Multidetector CT imaging of the abdomen and pelvis was performed following the standard protocol without IV contrast. RADIATION DOSE REDUCTION: This exam was performed according to the departmental dose-optimization program which includes automated exposure control, adjustment of the mA and/or kV according to patient size and/or use of iterative reconstruction technique. COMPARISON:  CT 07/25/2022 FINDINGS: Lower chest: No acute abnormality. Hepatobiliary: Unremarkable liver. Normal gallbladder. No biliary dilation. Pancreas: Unremarkable. Spleen: Unremarkable. Adrenals/Urinary Tract: Normal adrenal glands. Bilateral nonobstructing renal calculi. No hydronephrosis. Bladder is unremarkable. Stomach/Bowel: Stomach is within normal limits. Normal caliber large and  small bowel. Colonic diverticulosis without diverticulitis. No bowel wall thickening. Normal appendix. Postoperative change about the sigmoid colon. Assessment for active GI bleeding is not possible without IV contrast. Vascular/Lymphatic: Aortic atherosclerosis. No enlarged abdominal or pelvic lymph nodes. Reproductive: Status post hysterectomy. No adnexal masses. Other: No free intraperitoneal fluid or air. Musculoskeletal: No acute fracture. IMPRESSION: 1. No acute abnormality in the abdomen or pelvis. 2. Colonic diverticulosis without diverticulitis. 3. Bilateral nonobstructing renal calculi. 4. Aortic Atherosclerosis (ICD10-I70.0). Electronically Signed   By: Rozell Cornet M.D.   On: 04/05/2024 20:42    Microbiology: Results for orders placed or performed during the hospital encounter of 04/05/24  MRSA Next Gen by PCR, Nasal     Status: None   Collection Time: 04/06/24  4:10 PM   Specimen: Nasal Mucosa; Nasal Swab  Result Value Ref Range Status   MRSA by PCR Next Gen NOT DETECTED NOT DETECTED Final    Comment: (NOTE) The GeneXpert MRSA Assay (FDA approved for NASAL specimens only), is one component of a comprehensive MRSA colonization surveillance program. It is not intended to diagnose MRSA infection nor to guide or monitor treatment for MRSA infections. Test performance is not FDA approved in patients less than 27 years old. Performed at [facility], [address] [place], [place] [address]     Labs: CBC: Recent Labs  Lab 04/05/24 1745 04/05/24 2045 04/06/24 1129 04/06/24 2155 04/07/24 0038 04/07/24 1125 04/08/24 0318  WBC 4.0   < > 4.7 7.7 7.3 6.7 8.7  NEUTROABS 2.2  --   --   --   --   --   --   HGB 10.5*   < > 8.2* 12.0 11.9* 11.3* 11.7*  HCT 32.9*   < >  26.0* 35.9* 35.9* 35.6* 35.0*  MCV 96.5   < > 97.4 95.5 95.5 99.4 95.4  PLT 215   < > 148* 127* 123* 104* 115*   < > = values in this interval not displayed.   Basic Metabolic Panel: Recent  Labs  Lab 04/05/24 1745 04/06/24 0528 04/07/24 0038 04/08/24 0318  NA 139 144 140 140  K 5.5* 4.4 3.5 3.6  CL 110 116* 116* 114*  CO2 22 18* 19* 17*  GLUCOSE 88 83 72 147*  BUN 31* 22 15 14   CREATININE 1.63* 1.18* 1.12* 1.19*  CALCIUM  9.6 8.9 7.9* 9.0   Liver Function Tests: Recent Labs  Lab 04/05/24 1745  AST 38  ALT 23  ALKPHOS 51  BILITOT 1.0  PROT 7.6  ALBUMIN 4.0   CBG: Recent Labs  Lab 04/05/24 2032  GLUCAP 105*    Discharge time spent: greater than 30 minutes.  Signed: Bertram Brocks, MD [facility] 04/08/2024

## 2024-04-08 NOTE — Progress Notes (Signed)
 Patient ID: Stephanie Barber, female   DOB: [birth year], 83 y.o.   MRN: [id number] Patient being discharged home today.  She is status post visceral arteriogram with embolization on 04/06/24 for GI bleeding.  She reports no further bleeding episodes.  Hemoglobin and creatinine are stable.  Puncture site right common femoral artery soft, clean, dry, nontender, no hematoma.

## 2024-04-08 NOTE — Progress Notes (Signed)
 Discharge medications delivered to C Lena Qualia RN

## 2024-04-11 ENCOUNTER — Ambulatory Visit: Payer: Medicare Other | Admitting: Plastic Surgery

## 2024-04-15 ENCOUNTER — Encounter: Payer: Self-pay | Admitting: Plastic Surgery

## 2024-04-15 ENCOUNTER — Ambulatory Visit (INDEPENDENT_AMBULATORY_CARE_PROVIDER_SITE_OTHER): Payer: Medicare Other | Admitting: Plastic Surgery

## 2024-04-15 VITALS — BP 172/71 | HR 56

## 2024-04-15 DIAGNOSIS — S01319A Laceration without foreign body of unspecified ear, initial encounter: Secondary | ICD-10-CM | POA: Insufficient documentation

## 2024-04-15 NOTE — Progress Notes (Signed)
 Preoperative Dx: bilateral split ear lobe  Postoperative Dx: bilateral split ear lobe  Procedure: repair of bilateral split ear lobe  Anesthesia: Lidocaine  1% with 1:100,000 epinepherine  Indication for Procedure: split ear lobes  Description of Procedure: Right: Risks and complications were explained to the patient. Consent was confirmed. Time out was called and all information was confirmed to be correct. The ear lobe was prepped with betadine and drapped. A z plasty type mark was made and then lidocaine  1% with epinepherine was injected in the subcutaneous area. After waiting several minutes for the lidocaine  to take affect an #11 blade was used to make the incisions which included cutting out the previous epithelialized hole. A 5-0 Monocryl was used to reapproximate the deep tissue. The skin edges were reapproximated with 6-0 Monocryl interrupted sutures. Steri strips were applied.   Left: The ear lobe was prepped with betadine and drapped. A z plasty type mark was made and then lidocaine  1% with epinepherine was injected in the subcutaneous area. After waiting several minutes for the lidocaine  to take affect an #11 blade was used to make the incisions which included cutting out the previous epithelialized hole. A 5-0 Monocryl was used to reapproximate the deep tissue. The skin edges were reapproximated with 6-0 Monocryl interrupted sutures. Steri strips were applied. The patient is to follow up in one week. She tolerated the procedure well and there were no complications. Risks and complicatins were discussed and consent signed.

## 2024-04-16 ENCOUNTER — Telehealth: Payer: Self-pay

## 2024-04-16 NOTE — Telephone Encounter (Signed)
 Attempted to call patient, but there was no answer. Wanted to check in and see how the patient is one day post procedure in our office.

## 2024-04-22 ENCOUNTER — Ambulatory Visit: Payer: Self-pay | Admitting: Student

## 2024-04-22 ENCOUNTER — Encounter: Payer: Self-pay | Admitting: Student

## 2024-04-22 VITALS — BP 156/71 | HR 66 | Ht 59.0 in | Wt 108.0 lb

## 2024-04-22 DIAGNOSIS — S01319A Laceration without foreign body of unspecified ear, initial encounter: Secondary | ICD-10-CM

## 2024-04-22 NOTE — Progress Notes (Signed)
 Patient is an 83 year old female who underwent repair of her bilateral split earlobes with Dr. Orin Birk 1 week ago.  She presents to the clinic today for postprocedural follow-up.  Today, reports she is doing well.  She denies any issues or concerns since her procedure last week.  She denies any fevers, chills or drainage.  On exam, patient sitting upright in no acute distress.  Incisions to both of the earlobes are clean dry and intact.  There were several Monocryl sutures that were cut and removed.  Patient tolerated well.  There is no surrounding erythema.  No drainage on exam.  No signs of infection.  Discussed with patient would like her to apply Vaseline to her incisions daily for the next week or so.  Discussed with her that after that, she may transition to scar creams such as Mederma or silicone based scar cream or tape.  Discussed with patient to avoid direct sunlight as this can worsen the scar.  Patient expressed understanding.  Discussed with patient that she can get her ears pierced again in 6 months after she has completely healed.  Patient to follow-up as needed.  Instructed her to call if she has any questions or concerns about anything.

## 2024-04-24 DIAGNOSIS — H1045 Other chronic allergic conjunctivitis: Secondary | ICD-10-CM | POA: Diagnosis not present

## 2024-04-24 DIAGNOSIS — H0288B Meibomian gland dysfunction left eye, upper and lower eyelids: Secondary | ICD-10-CM | POA: Diagnosis not present

## 2024-04-24 DIAGNOSIS — H26492 Other secondary cataract, left eye: Secondary | ICD-10-CM | POA: Diagnosis not present

## 2024-04-24 DIAGNOSIS — E119 Type 2 diabetes mellitus without complications: Secondary | ICD-10-CM | POA: Diagnosis not present

## 2024-04-24 DIAGNOSIS — Z961 Presence of intraocular lens: Secondary | ICD-10-CM | POA: Diagnosis not present

## 2024-04-24 DIAGNOSIS — H0288A Meibomian gland dysfunction right eye, upper and lower eyelids: Secondary | ICD-10-CM | POA: Diagnosis not present

## 2024-05-12 DIAGNOSIS — N1832 Chronic kidney disease, stage 3b: Secondary | ICD-10-CM | POA: Diagnosis not present

## 2024-05-12 DIAGNOSIS — E1169 Type 2 diabetes mellitus with other specified complication: Secondary | ICD-10-CM | POA: Diagnosis not present

## 2024-05-12 DIAGNOSIS — E7800 Pure hypercholesterolemia, unspecified: Secondary | ICD-10-CM | POA: Diagnosis not present

## 2024-05-13 ENCOUNTER — Telehealth: Payer: Self-pay

## 2024-05-13 ENCOUNTER — Encounter: Payer: Self-pay | Admitting: Physician Assistant

## 2024-05-13 NOTE — Telephone Encounter (Signed)
 Pain in LBP-Radiates down bil legs, buttock pain started after taking Gabapentin . Would like to know if she needs to stop Med.  CB [phone]

## 2024-05-13 NOTE — Telephone Encounter (Signed)
 IC advised needed ROV-not seen since 08/2023 She will see Ronal Dragon tomorrow

## 2024-05-14 ENCOUNTER — Encounter: Payer: Self-pay | Admitting: Physician Assistant

## 2024-05-14 ENCOUNTER — Ambulatory Visit (INDEPENDENT_AMBULATORY_CARE_PROVIDER_SITE_OTHER): Admitting: Physician Assistant

## 2024-05-14 ENCOUNTER — Other Ambulatory Visit (INDEPENDENT_AMBULATORY_CARE_PROVIDER_SITE_OTHER): Payer: Self-pay

## 2024-05-14 DIAGNOSIS — M545 Low back pain, unspecified: Secondary | ICD-10-CM | POA: Diagnosis not present

## 2024-05-14 DIAGNOSIS — G8929 Other chronic pain: Secondary | ICD-10-CM

## 2024-05-14 MED ORDER — METHOCARBAMOL 500 MG PO TABS: 500.0000 mg | ORAL_TABLET | Freq: Three times a day (TID) | ORAL | 0 refills | Status: AC | PRN

## 2024-05-14 NOTE — Progress Notes (Signed)
 Office Visit Note   Patient: Stephanie Barber           Date of Birth: [birth year]           MRN: [id number] Visit Date: 05/14/2024              Requested by: Sun, Vyvyan, MD [phone] [address] [place],  [place] [address] PCP: Austin Nutley, MD      HPI: Patient is a pleasant 83 year old woman who is a patient of Dr. Brion.  She is here to follow-up on lower back and leg pain.  She said the pain in the back goes down the left leg side of the buttock down to the foot and goes numb and tingling.  Been going on for 4 to 5 days now and slowly getting worse.  She is very painful when she goes from a sit to a standing position.  She is taking Tylenol  has not had recent x-rays no injuries to report no loss of bowel or bladder control  Assessment & Plan: Visit Diagnoses: Low back pain  Plan: Patient has a previous history of low back pain going down her right leg a few years ago she did get an epidural injection by Dr. Eldonna which helped quite a bit.  Her symptoms are now more left-sided.  Will arrange for an MRI and then can be referred to Dr. Eldonna for injections.  Unfortunately she has chronic kidney disease so she cannot take anti-inflammatories.  She also has GI issues so also cannot take steroids.  Will follow-up with Dr. Ethyl  Follow-Up Instructions: No follow-ups on file.   Ortho Exam  Patient is alert, oriented, no adenopathy, well-dressed, normal affect, normal respiratory effort. Examination she has pain from sitting to standing is quite stiff.  No pain in the groin.  She has pain with flexion of her back in the lower back.  No step-offs no erythema.  She has pain positive straight leg raise on the left greater than the right.  She does have good strength with dorsiflexion plantarflexion extension and flexion of her legs she is neurovascular intact    Imaging: No results found. No images are attached to the encounter.  Labs: Lab Results  Component Value Date    HGBA1C 5.8 (H) 12/21/2022   HGBA1C 5.8 (H) 09/22/2022   HGBA1C 6.2 (H) 05/13/2015   ESRSEDRATE 3 06/30/2019   ESRSEDRATE 78 (H) 01/30/2017   CRP <1 06/30/2019   REPTSTATUS 09/21/2022 FINAL 09/20/2022   CULT (A) 09/20/2022    <10,000 COLONIES/mL INSIGNIFICANT GROWTH Performed at [facility], [address] [place], [place] [address]    LABORGA PROTEUS MIRABILIS (A) 01/30/2017   LABORGA ENTEROCOCCUS FAECALIS (A) 01/30/2017     Lab Results  Component Value Date   ALBUMIN 4.0 04/05/2024   ALBUMIN 4.3 06/06/2023   ALBUMIN 4.0 12/13/2022    Lab Results  Component Value Date   MG 2.0 12/13/2022   MG 2.3 09/23/2022   MG 1.4 (L) 09/22/2022   No results found for: VD25OH  No results found for: PREALBUMIN    Latest Ref Rng & Units 04/08/2024    3:18 AM 04/07/2024   11:25 AM 04/07/2024   12:38 AM  CBC EXTENDED  WBC 4.0 - 10.5 K/uL 8.7  6.7  7.3   RBC 3.87 - 5.11 MIL/uL 3.67  3.58  3.76   Hemoglobin 12.0 - 15.0 g/dL 88.2  88.6  88.0   HCT 36.0 -  46.0 % 35.0  35.6  35.9   Platelets 150 - 400 K/uL 115  104  123      There is no height or weight on file to calculate BMI.  Orders:  No orders of the defined types were placed in this encounter.  No orders of the defined types were placed in this encounter.    Procedures: No procedures performed  Clinical Data: No additional findings.  ROS:  All other systems negative, except as noted in the HPI. Review of Systems  Objective: Vital Signs: There were no vitals taken for this visit.  Specialty Comments:  MRI CERVICAL SPINE WITHOUT CONTRAST   TECHNIQUE: Multiplanar, multisequence MR imaging of the cervical spine was performed. No intravenous contrast was administered.   COMPARISON:  11/15/2016   FINDINGS: Alignment: Physiologic.   Vertebrae: No fracture, evidence of discitis, or bone lesion.   Cord: Normal signal and morphology.   Posterior Fossa, vertebral arteries, paraspinal tissues:  Negative.   Disc levels:   C1-2: Unremarkable.   C2-3: Small central disc protrusion. There is no spinal canal stenosis. No neural foraminal stenosis.   C3-4: Small disc bulge with uncovertebral hypertrophy. Unchanged mild spinal canal stenosis. Mild bilateral neural foraminal stenosis.   C4-5: Small disc bulge with uncovertebral hypertrophy, slightly worsened. Worsened mild spinal canal stenosis. Mild bilateral neural foraminal stenosis.   C5-6: Small central disc protrusion with bilateral uncovertebral hypertrophy. Mild spinal canal stenosis. Unchanged moderate left neural foraminal stenosis.   C6-7: Unchanged small disc bulge. There is no spinal canal stenosis. No neural foraminal stenosis.   C7-T1: Unchanged left facet hypertrophy. There is no spinal canal stenosis. Unchanged moderate left neural foraminal stenosis.   IMPRESSION: 1. Slight worsening of mild spinal canal stenosis at C4-5. 2. Unchanged mild spinal canal stenosis at C3-4 and C5-6. 3. Unchanged moderate left C5-6 and C7-T1 neural foraminal stenosis. 4. Otherwise mild multilevel foraminal stenosis, unchanged.     Electronically Signed   By: Franky Stanford M.D.   On: 11/13/2022 03:58  PMFS History: Patient Active Problem List   Diagnosis Date Noted   Torn ear lobe 04/15/2024   Acute lower GI bleeding 04/05/2024   Torn earlobe 01/11/2024   Impingement syndrome of left shoulder 10/31/2022   Other spondylosis with radiculopathy, cervical region 10/12/2022   GIB (gastrointestinal bleeding) 09/20/2022   Pancreatic insufficiency 09/20/2022   GI bleeding 07/23/2022   Stage 3b chronic kidney disease (CKD) (HCC)    Other spondylosis with radiculopathy, lumbar region 09/19/2021   IDA (iron  deficiency anemia) 07/06/2021   Protein-calorie malnutrition, severe 06/21/2021   Acute blood loss anemia    Malnutrition of moderate degree 06/16/2021   Hematochezia 06/15/2021   Acute GI bleeding 06/15/2021    Hyperlipidemia associated with type 2 diabetes mellitus (HCC) 06/15/2021   Left-sided headache 06/30/2019   Memory loss 06/30/2019   AKI (acute kidney injury) (HCC) 05/06/2017   Dehydration 05/06/2017   Chest pain 05/06/2017   Wound infection after surgery 01/30/2017   Palpitations 08/24/2016   Noncompliance with CPAP treatment 12/28/2015   Poor compliance with CPAP treatment 10/26/2014   CPAP use counseling 07/28/2014   OSA on CPAP 07/28/2014   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   Hypersomnolent 04/02/2014   Obstructive sleep apnea 04/02/2014   Non-compliance with treatment 03/05/2014   Type 2 diabetes mellitus (HCC) 12/05/2011   Hypertension 12/05/2011   GERD (gastroesophageal reflux disease)    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   Chronic cough 07/26/2011   Past Medical History:  Diagnosis Date   Arthritis    Chronic headache    Complication of anesthesia    allergy to Novocaine    Diabetes mellitus    GERD (gastroesophageal reflux disease)    GI bleed    Heart murmur    High cholesterol    Hyperlipidemia    Hypertension    Intracranial atherosclerosis    per MRA   Migraine    Narcolepsy and cataplexy    OSA    states has not used CPAP in over a year   Peripheral vascular disease (HCC)    Rectal prolapse    Stage 3b chronic kidney disease (CKD) (HCC)    Trigger finger of all digits of right hand     Family History  Problem Relation Age of Onset   Heart disease Mother    Throat cancer Father    Cancer Father        stomach   Kidney cancer Brother    Heart disease Brother     Past Surgical History:  Procedure Laterality Date   ABDOMINAL HYSTERECTOMY     Fibroids   BACK SURGERY     L4,L5 discectomy   BOTOX  INJECTION N/A 01/02/2023   Procedure: BOTOX  INJECTION;  Surgeon: Elisabeth Valli BIRCH, MD;  Location: [facility];  Service: Urology;  Laterality: N/A;   CARDIAC CATHETERIZATION     CARPAL TUNNEL RELEASE Right 01/18/2017   COLONOSCOPY N/A 07/28/2022    Procedure: COLONOSCOPY;  Surgeon: Rosalie Kitchens, MD;  Location: [facility];  Service: Gastroenterology;  Laterality: N/A;   COLONOSCOPY WITH PROPOFOL  N/A 06/17/2021   Procedure: COLONOSCOPY WITH PROPOFOL ;  Surgeon: Dianna Specking, MD;  Location: [facility];  Service: Endoscopy;  Laterality: N/A;   CYSTOSCOPY WITH INJECTION N/A 01/02/2023   Procedure: CYSTOSCOPY WITH LUEVENIA;  Surgeon: Elisabeth Valli BIRCH, MD;  Location: [facility];  Service: Urology;  Laterality: N/A;  45 MINS   ESOPHAGOGASTRODUODENOSCOPY (EGD) WITH PROPOFOL  N/A 06/17/2021   Procedure: ESOPHAGOGASTRODUODENOSCOPY (EGD) WITH PROPOFOL ;  Surgeon: Dianna Specking, MD;  [facility]: [facility];  Service: Endoscopy;  Laterality: N/A;   IR ANGIOGRAM SELECTIVE EACH ADDITIONAL VESSEL  04/06/2024   IR ANGIOGRAM SELECTIVE EACH ADDITIONAL VESSEL  04/06/2024   IR ANGIOGRAM SELECTIVE EACH ADDITIONAL VESSEL  04/06/2024   IR ANGIOGRAM SELECTIVE EACH ADDITIONAL VESSEL  04/06/2024   IR ANGIOGRAM SELECTIVE EACH ADDITIONAL VESSEL  04/06/2024   IR ANGIOGRAM SELECTIVE EACH ADDITIONAL VESSEL  04/06/2024   IR ANGIOGRAM VISCERAL SELECTIVE  04/06/2024   IR EMBO ART  VEN HEMORR LYMPH EXTRAV  INC GUIDE ROADMAPPING  04/06/2024   IR US  GUIDE VASC ACCESS RIGHT  04/06/2024   KIDNEY SURGERY  1998   Right.  growth removed   POLYPECTOMY  06/17/2021   Procedure: POLYPECTOMY;  Surgeon: Dianna Specking, MD;  [facility];  Service: Endoscopy;;   POLYPECTOMY  07/28/2022   Procedure: POLYPECTOMY;  Surgeon: Rosalie Kitchens, MD;  Location: [facility];  Service: Gastroenterology;;   rectal prolapse repair  12/05/2011   ROTATOR CUFF REPAIR     Left   SHOULDER ARTHROSCOPY WITH SUBACROMIAL DECOMPRESSION, ROTATOR CUFF REPAIR AND BICEP TENDON REPAIR Left 05/25/2015   Procedure: SHOULDER ARTHROSCOPY WITH SUBACROMIAL DECOMPRESSION, ROTATOR CUFF REPAIR AND BICEP TENODESIS, DEBRIDEMENT. ;  Surgeon: Glendia Cordella Hutchinson, MD;  Location: [facility];  Service: Orthopedics;   Laterality: Left;  LEFT SHOULDER ROTATOR CUFF TEAR REPAIR, ARTHROSCOPY, DEBRIDEMENT, BICEPS TENODESIS, SUBACROMIAL DECOMPRESSION.   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  Tobacco Use   Smoking status: Former    Current packs/day: 0.00    Types: Cigarettes    Quit date: 11/13/1980    Years since quitting: 43.5   Smokeless tobacco: Never   Tobacco comments:    quit in 1982  Vaping Use   Vaping status: Never Used  Substance and Sexual Activity   Alcohol  use: No    Alcohol /week: 0.0 standard drinks of alcohol    Drug use: No   Sexual activity: Yes

## 2024-05-15 ENCOUNTER — Telehealth: Payer: Self-pay | Admitting: Physician Assistant

## 2024-05-15 NOTE — Telephone Encounter (Signed)
 Pt states she was told to take this medication Accetaminophen 500mg  Tab. Pt states she can get this cheaper at the mail order pharmacy in [place], if she was tp get a Rx sent to them. Pt request a call

## 2024-05-21 ENCOUNTER — Encounter: Payer: Self-pay | Admitting: Physician Assistant

## 2024-05-26 ENCOUNTER — Ambulatory Visit
Admission: RE | Admit: 2024-05-26 | Discharge: 2024-05-26 | Disposition: A | Discharge status: Home / Self Care | Source: Ambulatory Visit | Attending: Physician Assistant | Admitting: Physician Assistant

## 2024-05-26 DIAGNOSIS — G8929 Other chronic pain: Secondary | ICD-10-CM

## 2024-05-26 DIAGNOSIS — M48061 Spinal stenosis, lumbar region without neurogenic claudication: Secondary | ICD-10-CM | POA: Diagnosis not present

## 2024-05-26 DIAGNOSIS — M47816 Spondylosis without myelopathy or radiculopathy, lumbar region: Secondary | ICD-10-CM | POA: Diagnosis not present

## 2024-05-26 DIAGNOSIS — M5126 Other intervertebral disc displacement, lumbar region: Secondary | ICD-10-CM | POA: Diagnosis not present

## 2024-05-29 ENCOUNTER — Other Ambulatory Visit (HOSPITAL_COMMUNITY): Payer: Self-pay

## 2024-06-04 DIAGNOSIS — Z1331 Encounter for screening for depression: Secondary | ICD-10-CM | POA: Diagnosis not present

## 2024-06-04 DIAGNOSIS — E1159 Type 2 diabetes mellitus with other circulatory complications: Secondary | ICD-10-CM | POA: Diagnosis not present

## 2024-06-04 DIAGNOSIS — Z23 Encounter for immunization: Secondary | ICD-10-CM | POA: Diagnosis not present

## 2024-06-04 DIAGNOSIS — I129 Hypertensive chronic kidney disease with stage 1 through stage 4 chronic kidney disease, or unspecified chronic kidney disease: Secondary | ICD-10-CM | POA: Diagnosis not present

## 2024-06-04 DIAGNOSIS — N3281 Overactive bladder: Secondary | ICD-10-CM | POA: Diagnosis not present

## 2024-06-04 DIAGNOSIS — R430 Anosmia: Secondary | ICD-10-CM | POA: Diagnosis not present

## 2024-06-04 DIAGNOSIS — E7800 Pure hypercholesterolemia, unspecified: Secondary | ICD-10-CM | POA: Diagnosis not present

## 2024-06-04 DIAGNOSIS — N1832 Chronic kidney disease, stage 3b: Secondary | ICD-10-CM | POA: Diagnosis not present

## 2024-06-04 DIAGNOSIS — Z0000 Encounter for general adult medical examination without abnormal findings: Secondary | ICD-10-CM | POA: Diagnosis not present

## 2024-06-09 ENCOUNTER — Encounter: Payer: Self-pay | Admitting: Physician Assistant

## 2024-06-12 DIAGNOSIS — N1832 Chronic kidney disease, stage 3b: Secondary | ICD-10-CM | POA: Diagnosis not present

## 2024-06-12 DIAGNOSIS — F0390 Unspecified dementia without behavioral disturbance: Secondary | ICD-10-CM | POA: Diagnosis not present

## 2024-06-12 DIAGNOSIS — E7800 Pure hypercholesterolemia, unspecified: Secondary | ICD-10-CM | POA: Diagnosis not present

## 2024-06-12 DIAGNOSIS — E1169 Type 2 diabetes mellitus with other specified complication: Secondary | ICD-10-CM | POA: Diagnosis not present

## 2024-06-30 ENCOUNTER — Telehealth: Payer: Self-pay | Admitting: Pharmacist

## 2024-06-30 NOTE — Progress Notes (Addendum)
   06/30/2024  Patient ID: Stephanie Barber, female   DOB: 12-29-1940, 83 y.o.   MRN: [id number]  Received referral from Dr. Austin regarding getting an accurate medication review for the patient.   Called and spoke with the patient on the phone this morning about completing medication review. Advised she is in the car at the moment, but can give me a call back later today to completed it. Advised she can call the office and they should be able to transfer her over as she could not write my number down while driving.   To reach me later, please transfer to [phone] or have the patient call that number back. Thank you!   Med review prep: (only meds filled for 2025) Amlodipine  2.5mg - 5/27 30DS (due) Metformin  500mg - 6/12 90DS Methocarbamol - 7/2 10DS Pantoprazole  5/27 30DS (due) *Missing metoprolol  succinate  8/25 update:  Called and spoke with the patient on the phone today regarding medications. Stopped Amlodipine  (2 months ago) and Atorvastatin (since 2023). Regarding re-starting, BP has looked good for past 2 visits and lipid panel is good (has been off for a while now). Currently needs Pantoprazole  at [facility] (out completely). Please send pantoprazole  refill to the pharmacy (attached in TE) and discuss reinitiation of medications if needed- thank you!  Aloysius Lewis, PharmD [facility]  Phone Number: [phone]

## 2024-07-01 DIAGNOSIS — N1832 Chronic kidney disease, stage 3b: Secondary | ICD-10-CM | POA: Diagnosis not present

## 2024-07-01 DIAGNOSIS — R197 Diarrhea, unspecified: Secondary | ICD-10-CM | POA: Diagnosis not present

## 2024-07-01 DIAGNOSIS — F03B0 Unspecified dementia, moderate, without behavioral disturbance, psychotic disturbance, mood disturbance, and anxiety: Secondary | ICD-10-CM | POA: Diagnosis not present

## 2024-07-01 DIAGNOSIS — M25562 Pain in left knee: Secondary | ICD-10-CM | POA: Diagnosis not present

## 2024-07-13 DIAGNOSIS — E7800 Pure hypercholesterolemia, unspecified: Secondary | ICD-10-CM | POA: Diagnosis not present

## 2024-07-13 DIAGNOSIS — N1832 Chronic kidney disease, stage 3b: Secondary | ICD-10-CM | POA: Diagnosis not present

## 2024-07-13 DIAGNOSIS — F0390 Unspecified dementia without behavioral disturbance: Secondary | ICD-10-CM | POA: Diagnosis not present

## 2024-07-13 DIAGNOSIS — E1169 Type 2 diabetes mellitus with other specified complication: Secondary | ICD-10-CM | POA: Diagnosis not present

## 2024-07-29 ENCOUNTER — Institutional Professional Consult (permissible substitution) (INDEPENDENT_AMBULATORY_CARE_PROVIDER_SITE_OTHER): Admitting: Otolaryngology

## 2024-07-29 NOTE — Progress Notes (Deleted)
 Dear Dr. Austin, Here is my assessment for our mutual patient, Stephanie Barber. Thank you for allowing me the opportunity to care for your patient. Please do not hesitate to contact me should you have any other questions. Sincerely, Dr. Eldora Blanch  Otolaryngology Clinic Note  HISTORY: Stephanie Barber is a 83 y.o. female with history of *** kindly referred by Dr. Austin for evaluation of ***.   She reports ***.  Current symptoms include ***.  She denies ***.  Symptoms began *** ago.  Symptom severity is ***.  Improvement occurred with ***.  Additional evaluation has included ***.  Allergy testing *** been done.  *** No previous sinonasal surgery.  She is currently using ***.  GLP-1: *** AP/AC: ***  Tobacco: *** Lives in ***  PMHx: LGIB, HTN, OSA on CPAP, CKD, MDD, HLD, DM, Memory decline  RADIOGRAPHIC EVALUATION AND INDEPENDENT REVIEW OF OTHER RECORDS:: Dr. Austin Referral notes reviewed and uploaded or available in chart in media tab (06/05/2024): lack of smell, interfering with ability to eat; Dx: Anosmia, ref to ENT Labs: BUN/Cr 15/1.51 (BMP - 06/04/2024); CMP 04/08/2024: WBC 8.7 Plt 115 CT Head 08/25/2023: paranasal sinuses clear, no masses visualized over olfactory cleft but cuts thick Past Medical History:  Diagnosis Date   Arthritis    Chronic headache    Complication of anesthesia    allergy to Novocaine    Diabetes mellitus    GERD (gastroesophageal reflux disease)    GI bleed    Heart murmur    High cholesterol    Hyperlipidemia    Hypertension    Intracranial atherosclerosis    per MRA   Migraine    Narcolepsy and cataplexy    OSA    states has not used CPAP in over a year   Peripheral vascular disease (HCC)    Rectal prolapse    Stage 3b chronic kidney disease (CKD) (HCC)    Trigger finger of all digits of right hand    Past Surgical History:  Procedure Laterality Date   ABDOMINAL HYSTERECTOMY     Fibroids   BACK SURGERY     L4,L5 discectomy   BOTOX  INJECTION  N/A 01/02/2023   Procedure: BOTOX  INJECTION;  Surgeon: Elisabeth Valli BIRCH, MD;  Location: [facility];  Service: Urology;  Laterality: N/A;   CARDIAC CATHETERIZATION     CARPAL TUNNEL RELEASE Right 01/18/2017   COLONOSCOPY N/A 07/28/2022   Procedure: COLONOSCOPY;  Surgeon: Rosalie Kitchens, MD;  [facility];  Service: Gastroenterology;  Laterality: N/A;   COLONOSCOPY WITH PROPOFOL  N/A 06/17/2021   Procedure: COLONOSCOPY WITH PROPOFOL ;  Surgeon: Dianna Specking, MD;  [facility];  Service: Endoscopy;  Laterality: N/A;   CYSTOSCOPY WITH INJECTION N/A 01/02/2023   Procedure: CYSTOSCOPY WITH LUEVENIA;  Surgeon: Elisabeth Valli BIRCH, MD;  Location: [facility];  Service: Urology;  Laterality: N/A;  45 MINS   ESOPHAGOGASTRODUODENOSCOPY (EGD) WITH PROPOFOL  N/A 06/17/2021   Procedure: ESOPHAGOGASTRODUODENOSCOPY (EGD) WITH PROPOFOL ;  Surgeon: Dianna Specking, MD;  [facility];  Service: Endoscopy;  Laterality: N/A;   IR ANGIOGRAM SELECTIVE EACH ADDITIONAL VESSEL  04/06/2024   IR ANGIOGRAM SELECTIVE EACH ADDITIONAL VESSEL  04/06/2024   IR ANGIOGRAM SELECTIVE EACH ADDITIONAL VESSEL  04/06/2024   IR ANGIOGRAM SELECTIVE EACH ADDITIONAL VESSEL  04/06/2024   IR ANGIOGRAM SELECTIVE EACH ADDITIONAL VESSEL  04/06/2024   IR ANGIOGRAM SELECTIVE EACH ADDITIONAL VESSEL  04/06/2024   IR ANGIOGRAM VISCERAL SELECTIVE  04/06/2024   IR EMBO ART  VEN HEMORR LYMPH EXTRAV  INC GUIDE ROADMAPPING  04/06/2024   IR US  GUIDE VASC ACCESS RIGHT  04/06/2024   KIDNEY SURGERY  1998   Right.  growth removed   POLYPECTOMY  06/17/2021   Procedure: POLYPECTOMY;  Surgeon: Dianna Specking, MD;  [facility]: [facility];  Service: Endoscopy;;   POLYPECTOMY  07/28/2022   Procedure: POLYPECTOMY;  Surgeon: Rosalie Kitchens, MD;  Location: [facility];  Service: Gastroenterology;;   rectal prolapse repair  12/05/2011   ROTATOR CUFF REPAIR     Left   SHOULDER ARTHROSCOPY WITH SUBACROMIAL DECOMPRESSION, ROTATOR CUFF REPAIR AND BICEP  TENDON REPAIR Left 05/25/2015   Procedure: SHOULDER ARTHROSCOPY WITH SUBACROMIAL DECOMPRESSION, ROTATOR CUFF REPAIR AND BICEP TENODESIS, DEBRIDEMENT. ;  Surgeon: Glendia Cordella Hutchinson, MD;  Location: [facility];  Service: Orthopedics;  Laterality: Left;  LEFT SHOULDER ROTATOR CUFF TEAR REPAIR, ARTHROSCOPY, DEBRIDEMENT, BICEPS TENODESIS, SUBACROMIAL DECOMPRESSION.   TOTAL ABDOMINAL HYSTERECTOMY     TRIGGER FINGER RELEASE Right 04/30/2018   Procedure: RELEASE TRIGGER FINGER/A-1 PULLEY RIGHT MIDDLE  AND SMALLn ;  Surgeon: Murrell Kuba, MD;  Location: [facility];  Service: Orthopedics;  Laterality: Right;  FAB, Bier block   TUBAL LIGATION     Family History  Problem Relation Age of Onset   Heart disease Mother    Throat cancer Father    Cancer Father        stomach   Kidney cancer Brother    Heart disease Brother    Social History   Tobacco Use   Smoking status: Former    Current packs/day: 0.00    Types: Cigarettes    Quit date: 11/13/1980    Years since quitting: 43.7   Smokeless tobacco: Never   Tobacco comments:    quit in 1982  Substance Use Topics   Alcohol  use: No    Alcohol /week: 0.0 standard drinks of alcohol    Allergies  Allergen Reactions   Morphine And Codeine Anaphylaxis   Lactose Intolerance (Gi) Diarrhea and Nausea Only   Current Outpatient Medications  Medication Sig Dispense Refill   acetaminophen  (TYLENOL ) 500 MG tablet Take 500 mg by mouth daily.     amLODipine  (NORVASC ) 2.5 MG tablet Take 1 tablet (2.5 mg total) by mouth daily. 30 tablet 3   metFORMIN  (GLUCOPHAGE ) 500 MG tablet Take 500 mg by mouth daily with breakfast.     methocarbamol  (ROBAXIN ) 500 MG tablet Take 1 tablet (500 mg total) by mouth every 8 (eight) hours as needed for muscle spasms. 30 tablet 0   metoprolol  succinate (TOPROL -XL) 50 MG 24 hr tablet Take 50 mg by mouth in the morning.     ondansetron  (ZOFRAN ) 4 MG tablet Take 1 tablet (4 mg total) by mouth every 8 (eight) hours as needed  for nausea or vomiting. 20 tablet 0   pantoprazole  (PROTONIX ) 40 MG tablet Take 1 tablet (40 mg total) by mouth daily before breakfast. 30 tablet 11   No current facility-administered medications for this visit.   There were no vitals taken for this visit.  PHYSICAL EXAM:  There were no vitals taken for this visit.   Salient findings:  CN II-XII intact *** Bilateral EAC clear and TM intact with well pneumatized middle ear spaces Weber 512: Rinne 512: AC > BC b/l *** Rine 1024: AC > BC b/l *** Nose: Anterior rhinoscopy reveals ***.  Nasal endoscopy was indicated to better evaluate the nose and paranasal sinuses, given the patient's history and exam findings, and is detailed below. No lesions of oral cavity/oropharynx; dentition ***  No obviously palpable neck masses/lymphadenopathy/thyromegaly No respiratory distress or stridor***   PROCEDURE:  Prior to initiating any procedures, risks/benefits/alternatives were explained to the patient and verbal consent obtained. Diagnostic Nasal Endoscopy Pre-procedure diagnosis: Concern for *** Post-procedure diagnosis: same Indication: See pre-procedure diagnosis and physical exam above Complications: None apparent EBL: *** mL Anesthesia: Lidocaine  4% and topical decongestant was topically sprayed in each nasal cavity  Description of Procedure:  Patient was identified. A rigid 30 degree endoscope was utilized to evaluate the sinonasal cavities, mucosa, sinus ostia and turbinates and septum.  Overall, signs of mucosal inflammation are*** noted.  Also noted are ***.  No mucopurulence, polyps, or masses noted.   Right Middle meatus: *** Right SE Recess: *** Left MM: *** Left SE Recess: *** Photodocumentation was obtained.  CPT CODE -- 31231 - Mod 25   ASSESSMENT:  ***  No diagnosis found.   PLAN: We've discussed issues and options today.  We reviewed the nasal endoscopy images together.  The risks, benefits and alternatives were  discussed and questions answered.  She has elected to proceed with:  1) 2) Follow-up in *** -- sooner as necessary.  See below regarding exact medications prescribed this encounter including dosages and route: No orders of the defined types were placed in this encounter.    Thank you for allowing me the opportunity to care for your patient. Please do not hesitate to contact me should you have any other questions.  Sincerely, Eldora Blanch, MD Otolaryngologist (ENT), [facility] Phone: [phone] Fax: [phone]  MDM:  Level ***: 99*** Complexity/Problems addressed: *** Data complexity: *** independent interpretation of *** - Morbidity: ***  - Prescription Drug prescribed or managed: ***  07/29/2024, 12:43 PM

## 2024-07-30 ENCOUNTER — Encounter: Payer: Self-pay | Admitting: Cardiology

## 2024-07-30 ENCOUNTER — Ambulatory Visit: Attending: Cardiology | Admitting: Cardiology

## 2024-07-30 VITALS — BP 154/72 | HR 93 | Resp 16 | Ht 59.0 in | Wt 118.2 lb

## 2024-07-30 DIAGNOSIS — I2584 Coronary atherosclerosis due to calcified coronary lesion: Secondary | ICD-10-CM

## 2024-07-30 DIAGNOSIS — E7800 Pure hypercholesterolemia, unspecified: Secondary | ICD-10-CM | POA: Diagnosis not present

## 2024-07-30 DIAGNOSIS — I10 Essential (primary) hypertension: Secondary | ICD-10-CM | POA: Diagnosis not present

## 2024-07-30 DIAGNOSIS — E119 Type 2 diabetes mellitus without complications: Secondary | ICD-10-CM

## 2024-07-30 DIAGNOSIS — I2510 Atherosclerotic heart disease of native coronary artery without angina pectoris: Secondary | ICD-10-CM | POA: Diagnosis not present

## 2024-07-30 DIAGNOSIS — R072 Precordial pain: Secondary | ICD-10-CM

## 2024-07-30 MED ORDER — ROSUVASTATIN CALCIUM 40 MG PO TABS
40.0000 mg | ORAL_TABLET | Freq: Every day | ORAL | 0 refills | Status: DC
Start: 2024-07-30 — End: 2024-08-02

## 2024-07-30 MED ORDER — ROSUVASTATIN CALCIUM 40 MG PO TABS
40.0000 mg | ORAL_TABLET | Freq: Every day | ORAL | 3 refills | Status: DC
Start: 2024-07-30 — End: 2024-10-06

## 2024-07-30 NOTE — Patient Instructions (Signed)
 Medication Instructions:  Your physician recommends that you continue on your current medications as directed. Please refer to the Current Medication list given to you today. RESTART: rosuvastatin  (Crestor ) 40 mg by mouth once daily   *If you need a refill on your cardiac medications before your next appointment, please call your pharmacy*  Lab Work: NONE  If you have labs (blood work) drawn today and your tests are completely normal, you will receive your results only by: MyChart Message (if you have MyChart) OR A paper copy in the mail If you have any lab test that is abnormal or we need to change your treatment, we will call you to review the results.  Testing/Procedures: NONE  Follow-Up: At [facility], you and your health needs are our priority.  As part of our continuing mission to provide you with exceptional heart care, our providers are all part of one team.  This team includes your primary Cardiologist (physician) and Advanced Practice Providers or APPs (Physician Assistants and Nurse Practitioners) who all work together to provide you with the care you need, when you need it.  Your next appointment:   1 year(s)  Provider:   Madonna Large, DO

## 2024-07-30 NOTE — Progress Notes (Signed)
 Cardiology Office Note:     NAME:  Stephanie Barber    MRN: [id number] DOB:  04-24-1941   PCP:  Sun, Vyvyan, MD  Former Cardiology Providers: N/A Primary Cardiologist:  Madonna Large, DO, [facility] (established care 12/20/2023) Electrophysiologist:  None   Chief Complaint  Patient presents with   Follow-up    Coronary artery calcification    History of Present Illness:    Stephanie Barber is a 83 y.o. African-American female whose past medical history and cardiovascular risk factors includes: Severe coronary artery calcification, aortic atherosclerosis diabetes, hypertension, hyperlipidemia, family hx of heart disease, .   Patient was referred to the practice in February 2025 for chest pain evaluation.  Patient was seen back in February 2025 she informed me that the indexed precordial discomfort happened back in December 2024.  She had not had any reoccurrence of chest pain but given her risk factors we did recommend echo and coronary calcium  score for further risk stratification.  Further workup would have been dependent on the results.  In addition, she was noted to have an LDL of 153 mg/dL and was started on Crestor  with follow-up labs in 6 weeks.  Echocardiogram February 2025 Hyperdynamic LVEF, normal diastolic function, and mild to moderate mitral regurgitation.  She was noted to have severe CAC with a total score of 597.  She had a follow-up stress test in March 2025 which was reported to be low risk.  Patient is accompanied by her husband at today's office visit.  No anginal chest pain or heart failure symptoms at the last office encounter. She tolerated Crestor  very well without any side effects or intolerances; however, stopped taking the medication when she ran out.  Her initial LDL level was 153 mg/dL and follow-up lipids noted a LDL of 69 mg/dL.  She is not on aspirin  given her history of GI bleed while she was on Xarelto .  Overall functional capacity remains relatively  stable.   Current Medications: Current Meds  Medication Sig   acetaminophen  (TYLENOL ) 500 MG tablet Take 500 mg by mouth daily.   amLODipine  (NORVASC ) 2.5 MG tablet Take 1 tablet (2.5 mg total) by mouth daily.   metFORMIN  (GLUCOPHAGE ) 500 MG tablet Take 500 mg by mouth daily with breakfast.   methocarbamol  (ROBAXIN ) 500 MG tablet Take 1 tablet (500 mg total) by mouth every 8 (eight) hours as needed for muscle spasms.   metoprolol  succinate (TOPROL -XL) 50 MG 24 hr tablet Take 50 mg by mouth in the morning.   ondansetron  (ZOFRAN ) 4 MG tablet Take 1 tablet (4 mg total) by mouth every 8 (eight) hours as needed for nausea or vomiting.   pantoprazole  (PROTONIX ) 40 MG tablet Take 1 tablet (40 mg total) by mouth daily before breakfast.   rosuvastatin  (CRESTOR ) 40 MG tablet Take 1 tablet (40 mg total) by mouth daily.   rosuvastatin  (CRESTOR ) 40 MG tablet Take 1 tablet (40 mg total) by mouth daily.     Allergies:    Morphine and codeine and Lactose intolerance (gi)   Past Medical History: Past Medical History:  Diagnosis Date   Arthritis    Chronic headache    Complication of anesthesia    allergy to Novocaine    Diabetes mellitus    GERD (gastroesophageal reflux disease)    GI bleed    Heart murmur    High cholesterol    Hyperlipidemia    Hypertension    Intracranial atherosclerosis    per MRA   Migraine  Narcolepsy and cataplexy    OSA    states has not used CPAP in over a year   Peripheral vascular disease (HCC)    Rectal prolapse    Stage 3b chronic kidney disease (CKD) (HCC)    Trigger finger of all digits of right hand     Past Surgical History: Past Surgical History:  Procedure Laterality Date   ABDOMINAL HYSTERECTOMY     Fibroids   BACK SURGERY     L4,L5 discectomy   BOTOX  INJECTION N/A 01/02/2023   Procedure: BOTOX  INJECTION;  Surgeon: Elisabeth Valli BIRCH, MD;  Location: [facility];  Service: Urology;  Laterality: N/A;   CARDIAC CATHETERIZATION     CARPAL TUNNEL  RELEASE Right 01/18/2017   COLONOSCOPY N/A 07/28/2022   Procedure: COLONOSCOPY;  Surgeon: Rosalie Kitchens, MD;  Location: [facility];  Service: Gastroenterology;  Laterality: N/A;   COLONOSCOPY WITH PROPOFOL  N/A 06/17/2021   Procedure: COLONOSCOPY WITH PROPOFOL ;  Surgeon: Dianna Specking, MD;  Location: [facility];  Service: Endoscopy;  Laterality: N/A;   CYSTOSCOPY WITH INJECTION N/A 01/02/2023   Procedure: CYSTOSCOPY WITH LUEVENIA;  Surgeon: Elisabeth Valli BIRCH, MD;  Location: [facility];  Service: Urology;  Laterality: N/A;  45 MINS   ESOPHAGOGASTRODUODENOSCOPY (EGD) WITH PROPOFOL  N/A 06/17/2021   Procedure: ESOPHAGOGASTRODUODENOSCOPY (EGD) WITH PROPOFOL ;  Surgeon: Dianna Specking, MD;  Location: [facility];  Service: Endoscopy;  Laterality: N/A;   IR ANGIOGRAM SELECTIVE EACH ADDITIONAL VESSEL  04/06/2024   IR ANGIOGRAM SELECTIVE EACH ADDITIONAL VESSEL  04/06/2024   IR ANGIOGRAM SELECTIVE EACH ADDITIONAL VESSEL  04/06/2024   IR ANGIOGRAM SELECTIVE EACH ADDITIONAL VESSEL  04/06/2024   IR ANGIOGRAM SELECTIVE EACH ADDITIONAL VESSEL  04/06/2024   IR ANGIOGRAM SELECTIVE EACH ADDITIONAL VESSEL  04/06/2024   IR ANGIOGRAM VISCERAL SELECTIVE  04/06/2024   IR EMBO ART  VEN HEMORR LYMPH EXTRAV  INC GUIDE ROADMAPPING  04/06/2024   IR US  GUIDE VASC ACCESS RIGHT  04/06/2024   KIDNEY SURGERY  1998   Right.  growth removed   POLYPECTOMY  06/17/2021   Procedure: POLYPECTOMY;  Surgeon: Dianna Specking, MD;  Location: [facility];  Service: Endoscopy;;   POLYPECTOMY  07/28/2022   Procedure: POLYPECTOMY;  Surgeon: Rosalie Kitchens, MD;  Location: [facility];  Service: Gastroenterology;;   rectal prolapse repair  12/05/2011   ROTATOR CUFF REPAIR     Left   SHOULDER ARTHROSCOPY WITH SUBACROMIAL DECOMPRESSION, ROTATOR CUFF REPAIR AND BICEP TENDON REPAIR Left 05/25/2015   Procedure: SHOULDER ARTHROSCOPY WITH SUBACROMIAL DECOMPRESSION, ROTATOR CUFF REPAIR AND BICEP TENODESIS, DEBRIDEMENT. ;  Surgeon: Glendia Cordella Hutchinson,  MD;  Location: [facility];  Service: Orthopedics;  Laterality: Left;  LEFT SHOULDER ROTATOR CUFF TEAR REPAIR, ARTHROSCOPY, DEBRIDEMENT, BICEPS TENODESIS, SUBACROMIAL DECOMPRESSION.   TOTAL ABDOMINAL HYSTERECTOMY     TRIGGER FINGER RELEASE Right 04/30/2018   Procedure: RELEASE TRIGGER FINGER/A-1 PULLEY RIGHT MIDDLE  AND SMALLn ;  Surgeon: Murrell Kuba, MD;  Location:  [facility];  Service: Orthopedics;  Laterality: Right;  FAB, Bier block   TUBAL LIGATION      Social History: Social History   Tobacco Use   Smoking status: Former    Current packs/day: 0.00    Types: Cigarettes    Quit date: 11/13/1980    Years since quitting: 43.7   Smokeless tobacco: Never   Tobacco comments:    quit in 1982  Vaping Use   Vaping status: Never Used  Substance Use Topics   Alcohol  use: No    Alcohol /week: 0.0 standard drinks  of alcohol    Drug use: No    Family History: Family History  Problem Relation Age of Onset   Heart disease Mother    Throat cancer Father    Cancer Father        stomach   Kidney cancer Brother    Heart disease Brother     ROS:   Review of Systems  Cardiovascular:  Negative for chest pain, claudication, irregular heartbeat, leg swelling, near-syncope, orthopnea, palpitations, paroxysmal nocturnal dyspnea and syncope.  Respiratory:  Negative for shortness of breath.   Hematologic/Lymphatic: Negative for bleeding problem.    EKGs/Labs/Other Studies Reviewed:    Echocardiogram: June 20 18: LVEF 60 to 65%, see report for additional details  February 2025 LVEF: >75%, mild LVH Diastolic Function: Normal Regurgitation: Mild to moderate TR Estimated RAP 3 mmHg See report for additional details   Coronary artery calcium  score: February 2025: LM 1.83  LAD 279  LCX 65.6  RCA 250  Total 597  IMPRESSION: Coronary calcium  score of 597. This was 58 th percentile for age and sex matched control.  Noncardiac findings: Aortic Atherosclerosis  (ICD10-I70.0).  Myocardial perfusion imaging: March 2025   The study is normal. The study is low risk.   No ST deviation was noted.   LV perfusion is normal. There is no evidence of ischemia. There is no evidence of infarction.   Left ventricular function is normal. Nuclear stress EF: 82%. The left ventricular ejection fraction is hyperdynamic (>65%). End diastolic cavity size is normal. End systolic cavity size is normal.  Labs:    Latest Ref Rng & Units 04/08/2024    3:18 AM 04/07/2024   11:25 AM 04/07/2024   12:38 AM  CBC  WBC 4.0 - 10.5 K/uL 8.7  6.7  7.3   Hemoglobin 12.0 - 15.0 g/dL 88.2  88.6  88.0   Hematocrit 36.0 - 46.0 % 35.0  35.6  35.9   Platelets 150 - 400 K/uL 115  104  123        Latest Ref Rng & Units 04/08/2024    3:18 AM 04/07/2024   12:38 AM 04/06/2024    5:28 AM  BMP  Glucose 70 - 99 mg/dL 852  72  83   BUN 8 - 23 mg/dL 14  15  22    Creatinine 0.44 - 1.00 mg/dL 8.80  8.87  8.81   Sodium 135 - 145 mmol/L 140  140  144   Potassium 3.5 - 5.1 mmol/L 3.6  3.5  4.4   Chloride 98 - 111 mmol/L 114  116  116   CO2 22 - 32 mmol/L 17  19  18    Calcium  8.9 - 10.3 mg/dL 9.0  7.9  8.9       Latest Ref Rng & Units 04/08/2024    3:18 AM 04/07/2024   12:38 AM 04/06/2024    5:28 AM  CMP  Glucose 70 - 99 mg/dL 852  72  83   BUN 8 - 23 mg/dL 14  15  22    Creatinine 0.44 - 1.00 mg/dL 8.80  8.87  8.81   Sodium 135 - 145 mmol/L 140  140  144   Potassium 3.5 - 5.1 mmol/L 3.6  3.5  4.4   Chloride 98 - 111 mmol/L 114  116  116   CO2 22 - 32 mmol/L 17  19  18    Calcium  8.9 - 10.3 mg/dL 9.0  7.9  8.9     No results found for:  CHOL, HDL, LDLCALC, LDLDIRECT, TRIG, CHOLHDL No results for input(s): LIPOA in the last 8760 hours. No components found for: NTPROBNP No results for input(s): PROBNP in the last 8760 hours. No results for input(s): TSH in the last 8760 hours.  External Labs: Collected: October 17, 2023 [facility] physicians. Total cholesterol 242,  triglycerides 224, HDL 48, LDL calculated 153, non-HDL 194  Results Component Value Reference Range Notes  Lipid Panel w/reflex Reviewed date:06/05/2024 03:52:03 PM Interpretation:LDL 69 Performing Lab: Notes/Report: Testing Performed at: [facility], [address], [address], [place], [place] [address]  Cholesterol 172 <200 mg/dL    CHOL/HDL 2.4 7.9-5.9 Ratio    HDLD 70 30-85 mg/dL Values below 40 mg/dL indicate increased risk factor  Triglyceride 198 0-199 mg/dL    NHDL 898 9-870 mg/dL Range dependent upon risk factors.  LDL Chol Calc (NIH) 69 0-99 mg/dL    Microalbumin Panel Reviewed date:06/05/2024 03:52:03 PM Interpretation:Negative Performing Lab: Notes/Report: Testing Performed at: [facility], [address], [address], [place], [place] [address]  MA/CR ratio 13.3 0.0-30.0 mg/G    UMA 1.35 0.00-1.90 mg/dL    UCR 898      Basic Metabolic Reviewed date:06/05/2024 03:52:03 PM Interpretation:eGFR 34 Performing Lab: Notes/Report: Testing Performed at: [facility], [address], [address], [place], [place] [address]  Glucose 164 70-99 mg/dL    BUN 25 3-73 mg/dL    Creatinine 8.48 9.39-8.69 mg/dl    [id number] 34 >39 calc Stage 1 > 90 ML/Min plus Albuminuria;Stage 2 60-89 ML/MIN;Stage 3 30-59 ML/MIN;Stage 4 15-29 ML/MIN;Stage 5 <15 ML/MIN  Sodium 145 136-145 mmol/L    Potassium 5.2 3.5-5.5 mmol/L    Chloride 105 98-107 mmol/L    CO2 29 22-32 mmol/L    Anion Gap 15.0 6.0-20.0 mmol/L    Calcium  10.1 8.6-10.3 mg/dL    Albumin 4.5 6.5-5.1 g/dL    CA-corrected 0.27 1.39-89.69 mg/dL    Hemoglobin J8r Reviewed date:06/05/2024 03:52:02 PM Interpretation:6.2 Performing Lab: Notes/Report: Testing Performed at: [facility], [address], [place], [place] [address]  eAG 131      Hgb A1c 6.2 4.8-5.6 % Prediabetes: 5.7-6.4%  Diabetes: >/= 6.5%   Microalbumin Panel Reviewed date:10/18/2023 08:48:01 AM Interpretation:Negative Performing  Lab: Notes/Report: Testing Performed at: [facility], [address], [address], [place], [place] [address]  MA/CR ratio 15.4 0.0-30.0 mg/G    UMA 1.97 0.00-1.90 mg/dL    UCR 871        Physical Exam:    Today's Vitals   07/30/24 1436  BP: (!) 154/72  Pulse: 93  Resp: 16  SpO2: 92%  Weight: 118 lb 3.2 oz (53.6 kg)  Height: 4' 11 (1.499 m)   Body mass index is 23.87 kg/m. Wt Readings from Last 3 Encounters:  07/30/24 118 lb 3.2 oz (53.6 kg)  04/22/24 108 lb (49 kg)  04/06/24 110 lb 10.7 oz (50.2 kg)    Physical Exam  Constitutional: No distress.  hemodynamically stable, uses a walker for ambulation, age-appropriate  Neck: No JVD present.  Cardiovascular: Normal rate, regular rhythm, S1 normal and S2 normal. Exam reveals no gallop, no S3 and no S4.  No murmur heard. Pulmonary/Chest: Effort normal and breath sounds normal. No stridor. She has no wheezes. She has no rales.  Abdominal: Soft. Bowel sounds are normal. She exhibits no distension. There is no abdominal tenderness.  Musculoskeletal:        General: No edema.     Cervical back: Neck supple.  Neurological: She is  alert and oriented to person, place, and time. She has intact cranial nerves (2-12).  Skin: Skin is warm.     Impression & Recommendation(s):  Impression:   ICD-10-CM   1. Coronary atherosclerosis due to calcified coronary lesion  I25.10    I25.84     2. Pure hypercholesterolemia  E78.00     3. Non-insulin  dependent type 2 diabetes mellitus (HCC)  E11.9     4. Benign hypertension  I10        Recommendation(s):  Coronary calcification  Severe CAC total score 597, placing her at the 79th percentile Not on aspirin  given her history of GI bleed while on Xarelto . Tolerated Crestor  well, will restart statin therapy. Has undergone appropriate ischemic workup including echocardiogram and stress test as outlined above. Clinically asymptomatic. Reemphasized the importance of secondary prevention  with focus on improving the modifiable cardiovascular risk factors such as glycemic control, lipid management, blood pressure control  Pure hypercholesterolemia Outside labs from December 2024 independently reviewed, LDL 153 mg/dL Was on atorvastatin in the past but discontinued for reasons unknown. Refill Crestor  40 mg p.o. nightly  Follow-up lipids noted LDL at 69 mg/dL  Non-insulin  dependent type 2 diabetes mellitus (HCC) Reemphasized importance of glycemic control. Currently on metformin , and will restart statin therapy. Recommended that she speak to her PCP with regards to starting ACE inhibitors or ARB for blood pressure management for renal protection  Benign hypertension Continue amlodipine  2.5 mg p.o. daily. Continue Toprol -XL 50 mg p.o. daily. Recommend ACE/ARB given her history of diabetes, as discussed above Reemphasized importance of low-salt diet.   Orders Placed:  No orders of the defined types were placed in this encounter.   Final Medication List:    Meds ordered this encounter  Medications   rosuvastatin  (CRESTOR ) 40 MG tablet    Sig: Take 1 tablet (40 mg total) by mouth daily.    Dispense:  90 tablet    Refill:  3   rosuvastatin  (CRESTOR ) 40 MG tablet    Sig: Take 1 tablet (40 mg total) by mouth daily.    Dispense:  15 tablet    Refill:  0    There are no discontinued medications.   Current Outpatient Medications:    acetaminophen  (TYLENOL ) 500 MG tablet, Take 500 mg by mouth daily., Disp: , Rfl:    amLODipine  (NORVASC ) 2.5 MG tablet, Take 1 tablet (2.5 mg total) by mouth daily., Disp: 30 tablet, Rfl: 3   metFORMIN  (GLUCOPHAGE ) 500 MG tablet, Take 500 mg by mouth daily with breakfast., Disp: , Rfl:    methocarbamol  (ROBAXIN ) 500 MG tablet, Take 1 tablet (500 mg total) by mouth every 8 (eight) hours as needed for muscle spasms., Disp: 30 tablet, Rfl: 0   metoprolol  succinate (TOPROL -XL) 50 MG 24 hr tablet, Take 50 mg by mouth in the morning., Disp: , Rfl:     ondansetron  (ZOFRAN ) 4 MG tablet, Take 1 tablet (4 mg total) by mouth every 8 (eight) hours as needed for nausea or vomiting., Disp: 20 tablet, Rfl: 0   pantoprazole  (PROTONIX ) 40 MG tablet, Take 1 tablet (40 mg total) by mouth daily before breakfast., Disp: 30 tablet, Rfl: 11   rosuvastatin  (CRESTOR ) 40 MG tablet, Take 1 tablet (40 mg total) by mouth daily., Disp: 90 tablet, Rfl: 3   rosuvastatin  (CRESTOR ) 40 MG tablet, Take 1 tablet (40 mg total) by mouth daily., Disp: 15 tablet, Rfl: 0  Consent:   NA  Disposition:   1 year follow-up sooner if  needed  Her questions and concerns were addressed to her satisfaction. She voices understanding of the recommendations provided during this encounter.    Signed, Madonna Michele HAS, [facility]  A Division of [facility] [address], [place], [place] [address]

## 2024-08-01 ENCOUNTER — Other Ambulatory Visit: Payer: Self-pay | Admitting: Cardiology

## 2024-08-11 DIAGNOSIS — L821 Other seborrheic keratosis: Secondary | ICD-10-CM | POA: Diagnosis not present

## 2024-08-11 DIAGNOSIS — L811 Chloasma: Secondary | ICD-10-CM | POA: Diagnosis not present

## 2024-08-12 DIAGNOSIS — F0390 Unspecified dementia without behavioral disturbance: Secondary | ICD-10-CM | POA: Diagnosis not present

## 2024-08-12 DIAGNOSIS — E7800 Pure hypercholesterolemia, unspecified: Secondary | ICD-10-CM | POA: Diagnosis not present

## 2024-08-12 DIAGNOSIS — E1122 Type 2 diabetes mellitus with diabetic chronic kidney disease: Secondary | ICD-10-CM | POA: Diagnosis not present

## 2024-08-12 DIAGNOSIS — E785 Hyperlipidemia, unspecified: Secondary | ICD-10-CM | POA: Diagnosis not present

## 2024-08-12 DIAGNOSIS — G47411 Narcolepsy with cataplexy: Secondary | ICD-10-CM | POA: Diagnosis not present

## 2024-08-12 DIAGNOSIS — N1832 Chronic kidney disease, stage 3b: Secondary | ICD-10-CM | POA: Diagnosis not present

## 2024-08-12 DIAGNOSIS — E1169 Type 2 diabetes mellitus with other specified complication: Secondary | ICD-10-CM | POA: Diagnosis not present

## 2024-08-12 DIAGNOSIS — I129 Hypertensive chronic kidney disease with stage 1 through stage 4 chronic kidney disease, or unspecified chronic kidney disease: Secondary | ICD-10-CM | POA: Diagnosis not present

## 2024-08-21 DIAGNOSIS — N1832 Chronic kidney disease, stage 3b: Secondary | ICD-10-CM | POA: Diagnosis not present

## 2024-08-27 ENCOUNTER — Ambulatory Visit (INDEPENDENT_AMBULATORY_CARE_PROVIDER_SITE_OTHER): Admitting: Orthopedic Surgery

## 2024-08-27 ENCOUNTER — Other Ambulatory Visit: Payer: Self-pay

## 2024-08-27 ENCOUNTER — Encounter: Payer: Self-pay | Admitting: Orthopedic Surgery

## 2024-08-27 DIAGNOSIS — M545 Low back pain, unspecified: Secondary | ICD-10-CM | POA: Diagnosis not present

## 2024-08-27 DIAGNOSIS — G8929 Other chronic pain: Secondary | ICD-10-CM

## 2024-08-27 DIAGNOSIS — M6530 Trigger finger, unspecified finger: Secondary | ICD-10-CM | POA: Diagnosis not present

## 2024-08-27 DIAGNOSIS — M25562 Pain in left knee: Secondary | ICD-10-CM | POA: Diagnosis not present

## 2024-08-27 DIAGNOSIS — M65962 Unspecified synovitis and tenosynovitis, left lower leg: Secondary | ICD-10-CM

## 2024-08-27 MED ORDER — BUPIVACAINE HCL 0.25 % IJ SOLN
4.0000 mL | INTRAMUSCULAR | Status: AC | PRN
  Administered 2024-08-27: 4 mL via INTRA_ARTICULAR

## 2024-08-27 MED ORDER — TRIAMCINOLONE ACETONIDE 40 MG/ML IJ SUSP
40.0000 mg | INTRAMUSCULAR | Status: AC | PRN
  Administered 2024-08-27: 40 mg via INTRA_ARTICULAR

## 2024-08-27 MED ORDER — LIDOCAINE HCL 1 % IJ SOLN
5.0000 mL | INTRAMUSCULAR | Status: AC | PRN
  Administered 2024-08-27: 5 mL

## 2024-08-27 NOTE — Progress Notes (Signed)
 Office Visit Note   Patient: Stephanie Barber           Date of Birth: [birth year]           MRN: [id number] Visit Date: 08/27/2024 Requested by: Sun, Vyvyan, MD [id number] [address] [place],  [place] [address] PCP: Austin Nutley, MD  Subjective: Chief Complaint  Patient presents with   Left Knee - Pain    HPI: Stephanie Barber is a 83 y.o. female who presents to the office reporting left knee pain as well as left leg pain.  Been going on for 2 months.  She ambulates with a rolling walker.  Denies any history of injury.  Has a rather constant ache in that left knee.  She also reports a trigger finger on the right-hand side.  She has had a lumbar spine MRI which does show a fair amount of facet arthritis mediated foraminal stenosis as well as central canal stenosis particularly at L4-5..                ROS: All systems reviewed are negative as they relate to the chief complaint within the history of present illness.  Patient denies fevers or chills.  Assessment & Plan: Visit Diagnoses:  1. Chronic pain of left knee     Plan: Impression is intrinsic left knee pathology with fairly underwhelming radiographs versus referred pain from the back to that left knee region.  Cortisone injection performed today in the knee for diagnostic and therapeutic purposes.  She needs another injection in her back with Dr. Eldonna.  Also needs to see Dr. Brutus for evaluation and management of her trigger finger.  We would like her to let us  know which injection helps her the most to the back injection or the knee injection.  I do not think she is going to have anything operative in the knee.  No effusion today.  She also is potentially less than ideal in terms of protein content and nourishment because of chronic GI issues.  Follow-Up Instructions: No follow-ups on file.   Orders:  Orders Placed This Encounter  Procedures   XR KNEE 3 VIEW LEFT   No orders of the defined types were placed in this  encounter.     Procedures: Large Joint Inj: L knee on 08/27/2024 10:40 PM Indications: diagnostic evaluation, joint swelling and pain Details: 18 G 1.5 in needle, superolateral approach  Arthrogram: No  Medications: 5 mL lidocaine  1 %; 4 mL bupivacaine  0.25 %; 40 mg triamcinolone  acetonide 40 MG/ML Outcome: tolerated well, no immediate complications Procedure, treatment alternatives, risks and benefits explained, specific risks discussed. Consent was given by the patient. Immediately prior to procedure a time out was called to verify the correct patient, procedure, equipment, support staff and site/side marked as required. Patient was prepped and draped in the usual sterile fashion.       Clinical Data: No additional findings.  Objective: Vital Signs: There were no vitals taken for this visit.  Physical Exam:  Constitutional: Patient appears well-developed HEENT:  Head: Normocephalic Eyes:EOM are normal Neck: Normal range of motion Cardiovascular: Normal rate Pulmonary/chest: Effort normal Neurologic: Patient is alert Skin: Skin is warm Psychiatric: Patient has normal mood and affect  Ortho Exam: Ortho exam demonstrates slightly antalgic gait to the left.  No effusion in the left knee.  Equivocal nerve root tension signs but no paresthesias L1-S1 bilaterally.  No groin pain with internal/external rotation of the leg.  Pedal pulses trace  palpable.  No patellofemoral crepitus with range of motion.  Range of motion is 0-1 15 of flexion.  Collateral and cruciate ligaments are stable.  Specialty Comments:  MRI CERVICAL SPINE WITHOUT CONTRAST   TECHNIQUE: Multiplanar, multisequence MR imaging of the cervical spine was performed. No intravenous contrast was administered.   COMPARISON:  11/15/2016   FINDINGS: Alignment: Physiologic.   Vertebrae: No fracture, evidence of discitis, or bone lesion.   Cord: Normal signal and morphology.   Posterior Fossa, vertebral  arteries, paraspinal tissues: Negative.   Disc levels:   C1-2: Unremarkable.   C2-3: Small central disc protrusion. There is no spinal canal stenosis. No neural foraminal stenosis.   C3-4: Small disc bulge with uncovertebral hypertrophy. Unchanged mild spinal canal stenosis. Mild bilateral neural foraminal stenosis.   C4-5: Small disc bulge with uncovertebral hypertrophy, slightly worsened. Worsened mild spinal canal stenosis. Mild bilateral neural foraminal stenosis.   C5-6: Small central disc protrusion with bilateral uncovertebral hypertrophy. Mild spinal canal stenosis. Unchanged moderate left neural foraminal stenosis.   C6-7: Unchanged small disc bulge. There is no spinal canal stenosis. No neural foraminal stenosis.   C7-T1: Unchanged left facet hypertrophy. There is no spinal canal stenosis. Unchanged moderate left neural foraminal stenosis.   IMPRESSION: 1. Slight worsening of mild spinal canal stenosis at C4-5. 2. Unchanged mild spinal canal stenosis at C3-4 and C5-6. 3. Unchanged moderate left C5-6 and C7-T1 neural foraminal stenosis. 4. Otherwise mild multilevel foraminal stenosis, unchanged.     Electronically Signed   By: Franky Stanford M.D.   On: 11/13/2022 03:58  Imaging: XR KNEE 3 VIEW LEFT Result Date: 08/27/2024 AP lateral merchant radiographs left knee reviewed.  No acute fracture.  Joint spaces reasonably well-maintained.  Small chronic ossicle off the lateral aspect of the patella.  Some calcification noted within the menisci bilaterally.    PMFS History: Patient Active Problem List   Diagnosis Date Noted   Torn ear lobe 04/15/2024   Acute lower GI bleeding 04/05/2024   Torn earlobe 01/11/2024   Impingement syndrome of left shoulder 10/31/2022   Other spondylosis with radiculopathy, cervical region 10/12/2022   GIB (gastrointestinal bleeding) 09/20/2022   Pancreatic insufficiency 09/20/2022   GI bleeding 07/23/2022   Stage 3b chronic kidney  disease (CKD) (HCC)    Other spondylosis with radiculopathy, lumbar region 09/19/2021   IDA (iron  deficiency anemia) 07/06/2021   Protein-calorie malnutrition, severe 06/21/2021   Acute blood loss anemia    Malnutrition of moderate degree 06/16/2021   Hematochezia 06/15/2021   Acute GI bleeding 06/15/2021   Hyperlipidemia associated with type 2 diabetes mellitus (HCC) 06/15/2021   Left-sided headache 06/30/2019   Memory loss 06/30/2019   AKI (acute kidney injury) 05/06/2017   Dehydration 05/06/2017   Chest pain 05/06/2017   Wound infection after surgery 01/30/2017   Palpitations 08/24/2016   Noncompliance with CPAP treatment 12/28/2015   Poor compliance with CPAP treatment 10/26/2014   CPAP use counseling 07/28/2014   OSA on CPAP 07/28/2014   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   Hypersomnolent 04/02/2014   Obstructive sleep apnea 04/02/2014   Non-compliance with treatment 03/05/2014   Type 2 diabetes mellitus (HCC) 12/05/2011   Hypertension 12/05/2011   GERD (gastroesophageal reflux disease)    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   Chronic cough 07/26/2011   Past Medical History:  Diagnosis Date   Arthritis    Chronic headache    Complication of anesthesia    allergy to Novocaine    Diabetes mellitus  GERD (gastroesophageal reflux disease)    GI bleed    Heart murmur    High cholesterol    Hyperlipidemia    Hypertension    Intracranial atherosclerosis    per MRA   Migraine    Narcolepsy and cataplexy    OSA    states has not used CPAP in over a year   Peripheral vascular disease    Rectal prolapse    Stage 3b chronic kidney disease (CKD) (HCC)    Trigger finger of all digits of right hand     Family History  Problem Relation Age of Onset   Heart disease Mother    Throat cancer Father    Cancer Father        stomach   Kidney cancer Brother    Heart disease Brother     Past Surgical History:  Procedure Laterality Date   ABDOMINAL HYSTERECTOMY      Fibroids   BACK SURGERY     L4,L5 discectomy   BOTOX  INJECTION N/A 01/02/2023   Procedure: BOTOX  INJECTION;  Surgeon: Elisabeth Valli BIRCH, MD;  Location: [facility];  Service: Urology;  Laterality: N/A;   CARDIAC CATHETERIZATION     CARPAL TUNNEL RELEASE Right 01/18/2017   COLONOSCOPY N/A 07/28/2022   Procedure: COLONOSCOPY;  Surgeon: Rosalie Kitchens, MD;  Location: [facility];  Service: Gastroenterology;  Laterality: N/A;   COLONOSCOPY WITH PROPOFOL  N/A 06/17/2021   Procedure: COLONOSCOPY WITH PROPOFOL ;  Surgeon: Dianna Specking, MD;  Location: [facility];  Service: Endoscopy;  Laterality: N/A;   CYSTOSCOPY WITH INJECTION N/A 01/02/2023   Procedure: CYSTOSCOPY WITH LUEVENIA;  Surgeon: Elisabeth Valli BIRCH, MD;  Location: [facility];  Service: Urology;  Laterality: N/A;  45 MINS   ESOPHAGOGASTRODUODENOSCOPY (EGD) WITH PROPOFOL  N/A 06/17/2021   Procedure: ESOPHAGOGASTRODUODENOSCOPY (EGD) WITH PROPOFOL ;  Surgeon: Dianna Specking, MD;  Location: [facility];  Service: Endoscopy;  Laterality: N/A;   IR ANGIOGRAM SELECTIVE EACH ADDITIONAL VESSEL  04/06/2024   IR ANGIOGRAM SELECTIVE EACH ADDITIONAL VESSEL  04/06/2024   IR ANGIOGRAM SELECTIVE EACH ADDITIONAL VESSEL  04/06/2024   IR ANGIOGRAM SELECTIVE EACH ADDITIONAL VESSEL  04/06/2024   IR ANGIOGRAM SELECTIVE EACH ADDITIONAL VESSEL  04/06/2024   IR ANGIOGRAM SELECTIVE EACH ADDITIONAL VESSEL  04/06/2024   IR ANGIOGRAM VISCERAL SELECTIVE  04/06/2024   IR EMBO ART  VEN HEMORR LYMPH EXTRAV  INC GUIDE ROADMAPPING  04/06/2024   IR US  GUIDE VASC ACCESS RIGHT  04/06/2024   KIDNEY SURGERY  1998   Right.  growth removed   POLYPECTOMY  06/17/2021   Procedure: POLYPECTOMY;  Surgeon: Dianna Specking, MD;  Location: [facility];  Service: Endoscopy;;   POLYPECTOMY  07/28/2022   Procedure: POLYPECTOMY;  Surgeon: Rosalie Kitchens, MD;  Location: [facility];  Service: Gastroenterology;;   rectal prolapse repair  12/05/2011   ROTATOR CUFF REPAIR     Left   SHOULDER  ARTHROSCOPY WITH SUBACROMIAL DECOMPRESSION, ROTATOR CUFF REPAIR AND BICEP TENDON REPAIR Left 05/25/2015   Procedure: SHOULDER ARTHROSCOPY WITH SUBACROMIAL DECOMPRESSION, ROTATOR CUFF REPAIR AND BICEP TENODESIS, DEBRIDEMENT. ;  Surgeon: Glendia Cordella Hutchinson, MD;  Location: [facility];  Service: Orthopedics;  Laterality: Left;  LEFT SHOULDER ROTATOR CUFF TEAR REPAIR, ARTHROSCOPY, DEBRIDEMENT, BICEPS TENODESIS, SUBACROMIAL DECOMPRESSION.   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  Tobacco Use   Smoking status: Former    Current packs/day: 0.00    Types: Cigarettes    Quit date: 11/13/1980    Years since quitting: 43.8   Smokeless tobacco: Never   Tobacco comments:    quit in 1982  Vaping Use   Vaping status: Never Used  Substance and Sexual Activity   Alcohol  use: No    Alcohol /week: 0.0 standard drinks of alcohol    Drug use: No   Sexual activity: Yes

## 2024-09-12 DIAGNOSIS — F0390 Unspecified dementia without behavioral disturbance: Secondary | ICD-10-CM | POA: Diagnosis not present

## 2024-09-12 DIAGNOSIS — E7800 Pure hypercholesterolemia, unspecified: Secondary | ICD-10-CM | POA: Diagnosis not present

## 2024-09-12 DIAGNOSIS — E1169 Type 2 diabetes mellitus with other specified complication: Secondary | ICD-10-CM | POA: Diagnosis not present

## 2024-09-12 DIAGNOSIS — N1832 Chronic kidney disease, stage 3b: Secondary | ICD-10-CM | POA: Diagnosis not present

## 2024-09-14 DIAGNOSIS — K219 Gastro-esophageal reflux disease without esophagitis: Secondary | ICD-10-CM | POA: Diagnosis not present

## 2024-09-14 DIAGNOSIS — E1122 Type 2 diabetes mellitus with diabetic chronic kidney disease: Secondary | ICD-10-CM | POA: Diagnosis not present

## 2024-09-14 DIAGNOSIS — E785 Hyperlipidemia, unspecified: Secondary | ICD-10-CM | POA: Diagnosis not present

## 2024-09-14 DIAGNOSIS — G8929 Other chronic pain: Secondary | ICD-10-CM | POA: Diagnosis not present

## 2024-09-14 DIAGNOSIS — I129 Hypertensive chronic kidney disease with stage 1 through stage 4 chronic kidney disease, or unspecified chronic kidney disease: Secondary | ICD-10-CM | POA: Diagnosis not present

## 2024-09-14 DIAGNOSIS — I2510 Atherosclerotic heart disease of native coronary artery without angina pectoris: Secondary | ICD-10-CM | POA: Diagnosis not present

## 2024-09-14 DIAGNOSIS — E11630 Type 2 diabetes mellitus with periodontal disease: Secondary | ICD-10-CM | POA: Diagnosis not present

## 2024-09-14 DIAGNOSIS — F325 Major depressive disorder, single episode, in full remission: Secondary | ICD-10-CM | POA: Diagnosis not present

## 2024-09-14 DIAGNOSIS — Z8249 Family history of ischemic heart disease and other diseases of the circulatory system: Secondary | ICD-10-CM | POA: Diagnosis not present

## 2024-09-15 ENCOUNTER — Encounter: Payer: Self-pay | Admitting: Radiology

## 2024-09-15 ENCOUNTER — Ambulatory Visit (INDEPENDENT_AMBULATORY_CARE_PROVIDER_SITE_OTHER): Admitting: Orthopedic Surgery

## 2024-09-15 DIAGNOSIS — M65321 Trigger finger, right index finger: Secondary | ICD-10-CM

## 2024-09-15 MED ORDER — BETAMETHASONE SOD PHOS & ACET 6 (3-3) MG/ML IJ SUSP
6.0000 mg | INTRAMUSCULAR | Status: AC | PRN
  Administered 2024-09-15: 6 mg via INTRA_ARTICULAR

## 2024-09-15 MED ORDER — LIDOCAINE HCL 1 % IJ SOLN
1.0000 mL | INTRAMUSCULAR | Status: AC | PRN
  Administered 2024-09-15: 1 mL

## 2024-09-15 NOTE — Progress Notes (Signed)
 Stephanie Barber - 83 y.o. female MRN [id number]  Date of birth: February 08, 1941  Office Visit Note: Visit Date: 09/15/2024 PCP: Barber, Vyvyan, MD Referred by: Stephanie Cordella Hamilton, MD  Subjective: No chief complaint on file.  HPI: Stephanie Barber is a pleasant 83 y.o. female who presents today for evaluation of right index finger clicking and locking over the past multiple weeks.  States that is becoming more painful in nature, is occasionally requiring manual correction.  Denies any significant numbness or tingling.  Has not undergone any prior workup or treatment.  Here today as a referral from Dr. Addie for specific hand surgical evaluation.  Pertinent ROS were reviewed with the patient and found to be negative unless otherwise specified above in HPI.   Visit Reason: Right index trigger Duration of symptoms: 2 weeks Hand dominance: right Occupation: retired Diabetic: Yes, 5.8 Smoking: No Heart/Lung History: Cardiovascular and Mediastinum Hypertension   Respiratory Obstructive sleep apnea   Blood Thinners:  none  Prior Testing/EMG: none Injections (Date): none Treatments: none Prior Surgery: none  Assessment & Plan: Visit Diagnoses:  1. Trigger finger, right index finger     Plan: Extensive discussion was had with the patient today regarding her right index trigger digit.  We discussed the etiology and pathophysiology of stenosing tenosynovitis.  We discussed conservative versus surgical treatment modalities.  From a conservative standpoint, we discussed activity modification, splinting, therapy and injections.  From a surgical standpoint, we discussed the possibility for trigger digit release as well as all risk and benefits associated.  Given that she has not trialed conservative treatments, patient is appropriate candidate for cortisone injection to the right index finger A1 pulley for symptom relief.  Risks and benefit of the cortisone injection were discussed in  detail, patient agreed to proceed.  Injection was provided today without issue, patient will return in approximate 6 weeks time for a recheck.   Follow-up: No follow-ups on file.   Meds & Orders: No orders of the defined types were placed in this encounter.   Orders Placed This Encounter  Procedures   Hand/UE Inj: R index A1     Procedures: Hand/UE Inj: R index A1 for trigger finger on 09/15/2024 8:15 PM Indications: pain Details: 25 G needle, volar approach Medications: 1 mL lidocaine  1 %; 6 mg betamethasone  acetate-betamethasone  sodium phosphate  6 (3-3) MG/ML Outcome: tolerated well, no immediate complications Procedure, treatment alternatives, risks and benefits explained, specific risks discussed. Consent was given by the patient. Patient was prepped and draped in the usual sterile fashion.          Clinical History: MRI LUMBAR SPINE WITHOUT CONTRAST   TECHNIQUE: Multiplanar, multisequence MR imaging of the lumbar spine was performed. No intravenous contrast was administered.   COMPARISON:  MRI 08/29/2021.  Radiography 05/14/2024.   FINDINGS: Segmentation:  5 lumbar type vertebral bodies.   Alignment:  1-2 mm anterolisthesis L2-3.   Vertebrae: No fracture or focal bone lesion. No edematous endplate marrow changes. Some edema/effusions associated with the facet joints at L2-3.   Conus medullaris and cauda equina: Conus extends to the L1 level. Conus and cauda equina appear normal.   Paraspinal and other soft tissues: Negative   Disc levels:   No significant finding from T11-12 through L1-2.   L2-3: Bulging of the disc. Bilateral facet arthropathy with facet and ligamentous hypertrophy. Small joint effusions and joint edema. 1 mm of anterolisthesis which could worsen with standing or flexion. Findings at this level could certainly  relate to back pain or referred facet syndrome pain. There is mild stenosis of both lateral recesses. Definite neural compression  is not established in this position but the appearance could worsen with standing or flexion.   L3-4: Bulging of the disc. Bilateral facet degeneration and hypertrophy. Mild multifactorial stenosis. Definite neural compression is not established.   L4-5: Endplate osteophytes and bulging of the disc. Previous posterior decompression. Sufficient patency of the central canal. Mild stenosis of the lateral recesses and foramina but no visible neural compression.   L5-S1: Mild bulging of the disc. Bilateral facet degeneration and hypertrophy. No compressive stenosis.   Findings at L2-3 and L3-4 of worsened slightly since 2022.   IMPRESSION: 1. L2-3: Bulging of the disc. Bilateral facet arthropathy with facet and ligamentous hypertrophy. Small joint effusions and joint edema. 1 mm of anterolisthesis which could worsen with standing or flexion. Findings at this level could relate to back pain or referred facet syndrome pain. Mild stenosis of both lateral recesses but no visible neural compression in this position. 2. L3-4: Bulging of the disc. Bilateral facet degeneration and hypertrophy. Mild multifactorial stenosis but no visible neural compression. 3. L4-5: Previous posterior decompression. Sufficient patency of the central canal. Mild stenosis of the lateral recesses and foramina but no visible neural compression. 4. L5-S1: Disc bulge. Facet degeneration and hypertrophy. No compressive stenosis.     Electronically Signed   By: Stephanie Barber M.D.   On: 05/27/2024 13:40  She reports that she quit smoking about 43 years ago. Her smoking use included cigarettes. She has never used smokeless tobacco. No results for input(s): HGBA1C, LABURIC in the last 8760 hours.  Objective:   Vital Signs: There were no vitals taken for this visit.  Physical Exam  Gen: Well-appearing, in no acute distress; non-toxic CV: Regular Rate. Well-perfused. Warm.  Resp: Breathing unlabored on room air;  no wheezing. Psych: Fluid speech in conversation; appropriate affect; normal thought process  Ortho Exam Right hand: - Palpable nodule at the A1 pulley of the index finger, associated tenderness - Notable clicking with deep flexion of the index finger, moderate evidence of locking with deep flexion - Sensation intact distally, hand remains warm well-perfused    Imaging: No results found.  Past Medical/Family/Surgical/Social History: Medications & Allergies reviewed per EMR, new medications updated. Patient Active Problem List   Diagnosis Date Noted   Torn ear lobe 04/15/2024   Acute lower GI bleeding 04/05/2024   Torn earlobe 01/11/2024   Impingement syndrome of left shoulder 10/31/2022   Other spondylosis with radiculopathy, cervical region 10/12/2022   GIB (gastrointestinal bleeding) 09/20/2022   Pancreatic insufficiency 09/20/2022   GI bleeding 07/23/2022   Stage 3b chronic kidney disease (CKD) (HCC)    Other spondylosis with radiculopathy, lumbar region 09/19/2021   IDA (iron  deficiency anemia) 07/06/2021   Protein-calorie malnutrition, severe 06/21/2021   Acute blood loss anemia    Malnutrition of moderate degree 06/16/2021   Hematochezia 06/15/2021   Acute GI bleeding 06/15/2021   Hyperlipidemia associated with type 2 diabetes mellitus (HCC) 06/15/2021   Left-sided headache 06/30/2019   Memory loss 06/30/2019   AKI (acute kidney injury) 05/06/2017   Dehydration 05/06/2017   Chest pain 05/06/2017   Wound infection after surgery 01/30/2017   Palpitations 08/24/2016   Noncompliance with CPAP treatment 12/28/2015   Poor compliance with CPAP treatment 10/26/2014   CPAP use counseling 07/28/2014   OSA on CPAP 07/28/2014   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   Hypersomnolent 04/02/2014  Obstructive sleep apnea 04/02/2014   Non-compliance with treatment 03/05/2014   Type 2 diabetes mellitus (HCC) 12/05/2011   Hypertension 12/05/2011   GERD (gastroesophageal  reflux disease)    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   Chronic cough 07/26/2011   Past Medical History:  Diagnosis Date   Arthritis    Chronic headache    Complication of anesthesia    allergy to Novocaine    Diabetes mellitus    GERD (gastroesophageal reflux disease)    GI bleed    Heart murmur    High cholesterol    Hyperlipidemia    Hypertension    Intracranial atherosclerosis    per MRA   Migraine    Narcolepsy and cataplexy    OSA    states has not used CPAP in over a year   Peripheral vascular disease    Rectal prolapse    Stage 3b chronic kidney disease (CKD) (HCC)    Trigger finger of all digits of right hand    Family History  Problem Relation Age of Onset   Heart disease Mother    Throat cancer Father    Cancer Father        stomach   Kidney cancer Brother    Heart disease Brother    Past Surgical History:  Procedure Laterality Date   ABDOMINAL HYSTERECTOMY     Fibroids   BACK SURGERY     L4,L5 discectomy   BOTOX  INJECTION N/A 01/02/2023   Procedure: BOTOX  INJECTION;  Surgeon: Elisabeth Valli BIRCH, MD;  Location: [facility];  Service: Urology;  Laterality: N/A;   CARDIAC CATHETERIZATION     CARPAL TUNNEL RELEASE Right 01/18/2017   COLONOSCOPY N/A 07/28/2022   Procedure: COLONOSCOPY;  Surgeon: Rosalie Kitchens, MD;  Location: [facility];  Service: Gastroenterology;  Laterality: N/A;   COLONOSCOPY WITH PROPOFOL  N/A 06/17/2021   Procedure: COLONOSCOPY WITH PROPOFOL ;  Surgeon: Dianna Specking, MD;  Location: [facility];  Service: Endoscopy;  Laterality: N/A;   CYSTOSCOPY WITH INJECTION N/A 01/02/2023   Procedure: CYSTOSCOPY WITH LUEVENIA;  Surgeon: Elisabeth Valli BIRCH, MD;  Location: [facility];  Service: Urology;  Laterality: N/A;  45 MINS   ESOPHAGOGASTRODUODENOSCOPY (EGD) WITH PROPOFOL  N/A 06/17/2021   Procedure: ESOPHAGOGASTRODUODENOSCOPY (EGD) WITH PROPOFOL ;  Surgeon: Dianna Specking, MD;  Location: [facility];  Service: Endoscopy;  Laterality: N/A;   IR  ANGIOGRAM SELECTIVE EACH ADDITIONAL VESSEL  04/06/2024   IR ANGIOGRAM SELECTIVE EACH ADDITIONAL VESSEL  04/06/2024   IR ANGIOGRAM SELECTIVE EACH ADDITIONAL VESSEL  04/06/2024   IR ANGIOGRAM SELECTIVE EACH ADDITIONAL VESSEL  04/06/2024   IR ANGIOGRAM SELECTIVE EACH ADDITIONAL VESSEL  04/06/2024   IR ANGIOGRAM SELECTIVE EACH ADDITIONAL VESSEL  04/06/2024   IR ANGIOGRAM VISCERAL SELECTIVE  04/06/2024   IR EMBO ART  VEN HEMORR LYMPH EXTRAV  INC GUIDE ROADMAPPING  04/06/2024   IR US  GUIDE VASC ACCESS RIGHT  04/06/2024   KIDNEY SURGERY  1998   Right.  growth removed   POLYPECTOMY  06/17/2021   Procedure: POLYPECTOMY;  Surgeon: Dianna Specking, MD;  Location: [facility];  Service: Endoscopy;;   POLYPECTOMY  07/28/2022   Procedure: POLYPECTOMY;  Surgeon: Rosalie Kitchens, MD;  Location: [facility];  Service: Gastroenterology;;   rectal prolapse repair  12/05/2011   ROTATOR CUFF REPAIR     Left   SHOULDER ARTHROSCOPY WITH SUBACROMIAL DECOMPRESSION, ROTATOR CUFF REPAIR AND BICEP TENDON REPAIR Left 05/25/2015   Procedure: SHOULDER ARTHROSCOPY WITH SUBACROMIAL DECOMPRESSION, ROTATOR CUFF REPAIR AND BICEP TENODESIS, DEBRIDEMENT. ;  Surgeon: Glendia  Cordella Hutchinson, MD;  Location: [facility];  Service: Orthopedics;  Laterality: Left;  LEFT SHOULDER ROTATOR CUFF TEAR REPAIR, ARTHROSCOPY, DEBRIDEMENT, BICEPS TENODESIS, SUBACROMIAL DECOMPRESSION.   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  Tobacco Use   Smoking status: Former    Current packs/day: 0.00    Types: Cigarettes    Quit date: 11/13/1980    Years since quitting: 43.8   Smokeless tobacco: Never   Tobacco comments:     quit in 1982  Vaping Use   Vaping status: Never Used  Substance and Sexual Activity   Alcohol  use: No    Alcohol /week: 0.0 standard drinks of alcohol    Drug use: No   Sexual activity: Yes    Marquies Wanat Afton Alderton, M.D. [facility], Hand Surgery

## 2024-09-23 ENCOUNTER — Other Ambulatory Visit (INDEPENDENT_AMBULATORY_CARE_PROVIDER_SITE_OTHER): Payer: Self-pay

## 2024-09-23 ENCOUNTER — Ambulatory Visit: Admitting: Physical Medicine and Rehabilitation

## 2024-09-23 ENCOUNTER — Ambulatory Visit: Admitting: Physician Assistant

## 2024-09-23 ENCOUNTER — Other Ambulatory Visit: Payer: Self-pay

## 2024-09-23 VITALS — BP 182/90 | HR 73

## 2024-09-23 DIAGNOSIS — M79674 Pain in right toe(s): Secondary | ICD-10-CM

## 2024-09-23 DIAGNOSIS — M5416 Radiculopathy, lumbar region: Secondary | ICD-10-CM

## 2024-09-23 MED ORDER — METHYLPREDNISOLONE ACETATE 40 MG/ML IJ SUSP
40.0000 mg | Freq: Once | INTRAMUSCULAR | Status: AC
  Administered 2024-09-23: 40 mg

## 2024-09-23 NOTE — Progress Notes (Signed)
 Office Visit Note   Patient: Stephanie Barber           Date of Birth: [birth year]           MRN: [id number] Visit Date: 09/23/2024              Requested by: Sun, Vyvyan, MD [phone] [address] [place],  [place] [address] PCP: Austin Nutley, MD   Assessment & Plan: Visit Diagnoses:  1. Toe pain, right     Plan: Patient is a pleasant 83 year old woman who comes in today with chronic pain on the medial side of the right fifth toe.  She says this comes and goes.  She denies any fever chills or illness.  She does get a pedicure on a regular basis.  Usually it goes away but she is has been particularly painful exam does not show any foreign but certainly an area of pressure between the 5th and 4th toe.  I have talked her about getting shoes with wider toe boxes I will give her a toe sleeve to use.  She can follow-up with [facility] in 3 weeks sooner if she has any problems  Follow-Up Instructions: Return in about 3 weeks (around 10/14/2024).   Orders:  Orders Placed This Encounter  Procedures   XR Toe 5th Right   No orders of the defined types were placed in this encounter.     Procedures: No procedures performed   Clinical Data: No additional findings.   Subjective: Chief Complaint  Patient presents with   Right Foot - Pain    5TH TOE PAIN    HPI patient is a pleasant 83 year old woman comes in today with pain on the inside of her toe on the right foot.  She denies any injury she had a pedicure about a month ago but this is not unusual and she has had this happen before.  She denies any drainage from the area denies any fever or chills.  Review of Systems  All other systems reviewed and are negative.    Objective: Vital Signs: There were no vitals taken for this visit.  Physical Exam Constitutional:      Appearance: Normal appearance.  Pulmonary:     Effort: Pulmonary effort is normal.  Skin:    General: Skin is warm and dry.  Neurological:     General: No  focal deficit present.     Mental Status: She is alert and oriented to person, place, and time.  Psychiatric:        Mood and Affect: Mood normal.        Behavior: Behavior normal.     Ortho Exam Examination of her right foot she has strong dorsalis pedis pulse she has no induration she has no ascending cellulitis she has no redness in the fifth toe.  She does have an area on the inside of the toe distally that is tender to palpation but is soft without skin breakdown perhaps very small beginnings of a corn findings consistent with pressure against other toe Specialty Comments:  MRI LUMBAR SPINE WITHOUT CONTRAST   TECHNIQUE: Multiplanar, multisequence MR imaging of the lumbar spine was performed. No intravenous contrast was administered.   COMPARISON:  MRI 08/29/2021.  Radiography 05/14/2024.   FINDINGS: Segmentation:  5 lumbar type vertebral bodies.   Alignment:  1-2 mm anterolisthesis L2-3.   Vertebrae: No fracture or focal bone lesion. No edematous endplate marrow changes. Some edema/effusions associated with the facet joints at L2-3.  Conus medullaris and cauda equina: Conus extends to the L1 level. Conus and cauda equina appear normal.   Paraspinal and other soft tissues: Negative   Disc levels:   No significant finding from T11-12 through L1-2.   L2-3: Bulging of the disc. Bilateral facet arthropathy with facet and ligamentous hypertrophy. Small joint effusions and joint edema. 1 mm of anterolisthesis which could worsen with standing or flexion. Findings at this level could certainly relate to back pain or referred facet syndrome pain. There is mild stenosis of both lateral recesses. Definite neural compression is not established in this position but the appearance could worsen with standing or flexion.   L3-4: Bulging of the disc. Bilateral facet degeneration and hypertrophy. Mild multifactorial stenosis. Definite neural compression is not established.   L4-5:  Endplate osteophytes and bulging of the disc. Previous posterior decompression. Sufficient patency of the central canal. Mild stenosis of the lateral recesses and foramina but no visible neural compression.   L5-S1: Mild bulging of the disc. Bilateral facet degeneration and hypertrophy. No compressive stenosis.   Findings at L2-3 and L3-4 of worsened slightly since 2022.   IMPRESSION: 1. L2-3: Bulging of the disc. Bilateral facet arthropathy with facet and ligamentous hypertrophy. Small joint effusions and joint edema. 1 mm of anterolisthesis which could worsen with standing or flexion. Findings at this level could relate to back pain or referred facet syndrome pain. Mild stenosis of both lateral recesses but no visible neural compression in this position. 2. L3-4: Bulging of the disc. Bilateral facet degeneration and hypertrophy. Mild multifactorial stenosis but no visible neural compression. 3. L4-5: Previous posterior decompression. Sufficient patency of the central canal. Mild stenosis of the lateral recesses and foramina but no visible neural compression. 4. L5-S1: Disc bulge. Facet degeneration and hypertrophy. No compressive stenosis.     Electronically Signed   By: Oneil Officer M.D.   On: 05/27/2024 13:40  Imaging: XR Toe 5th Right Result Date: 09/23/2024 X-rays of her right fifth toe do not demonstrate any acute fractures or dislocation    PMFS History: Patient Active Problem List   Diagnosis Date Noted   Torn ear lobe 04/15/2024   Acute lower GI bleeding 04/05/2024   Torn earlobe 01/11/2024   Impingement syndrome of left shoulder 10/31/2022   Other spondylosis with radiculopathy, cervical region 10/12/2022   GIB (gastrointestinal bleeding) 09/20/2022   Pancreatic insufficiency 09/20/2022   GI bleeding 07/23/2022   Stage 3b chronic kidney disease (CKD) (HCC)    Other spondylosis with radiculopathy, lumbar region 09/19/2021   IDA (iron  deficiency anemia)  07/06/2021   Protein-calorie malnutrition, severe 06/21/2021   Acute blood loss anemia    Malnutrition of moderate degree 06/16/2021   Hematochezia 06/15/2021   Acute GI bleeding 06/15/2021   Hyperlipidemia associated with type 2 diabetes mellitus (HCC) 06/15/2021   Left-sided headache 06/30/2019   Memory loss 06/30/2019   AKI (acute kidney injury) 05/06/2017   Dehydration 05/06/2017   Chest pain 05/06/2017   Wound infection after surgery 01/30/2017   Palpitations 08/24/2016   Noncompliance with CPAP treatment 12/28/2015   Poor compliance with CPAP treatment 10/26/2014   CPAP use counseling 07/28/2014   OSA on CPAP 07/28/2014   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   Hypersomnolent 04/02/2014   Obstructive sleep apnea 04/02/2014   Non-compliance with treatment 03/05/2014   Type 2 diabetes mellitus (HCC) 12/05/2011   Hypertension 12/05/2011   GERD (gastroesophageal reflux disease)    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   Chronic cough 07/26/2011  Past Medical History:  Diagnosis Date   Arthritis    Chronic headache    Complication of anesthesia    allergy to Novocaine    Diabetes mellitus    GERD (gastroesophageal reflux disease)    GI bleed    Heart murmur    High cholesterol    Hyperlipidemia    Hypertension    Intracranial atherosclerosis    per MRA   Migraine    Narcolepsy and cataplexy    OSA    states has not used CPAP in over a year   Peripheral vascular disease    Rectal prolapse    Stage 3b chronic kidney disease (CKD) (HCC)    Trigger finger of all digits of right hand     Family History  Problem Relation Age of Onset   Heart disease Mother    Throat cancer Father    Cancer Father        stomach   Kidney cancer Brother    Heart disease Brother     Past Surgical History:  Procedure Laterality Date   ABDOMINAL HYSTERECTOMY     Fibroids   BACK SURGERY     L4,L5 discectomy   BOTOX  INJECTION N/A 01/02/2023   Procedure: BOTOX  INJECTION;   Surgeon: Elisabeth Valli BIRCH, MD;  Location: [facility];  Service: Urology;  Laterality: N/A;   CARDIAC CATHETERIZATION     CARPAL TUNNEL RELEASE Right 01/18/2017   COLONOSCOPY N/A 07/28/2022   Procedure: COLONOSCOPY;  Surgeon: Rosalie Kitchens, MD;  Location: [facility];  Service: Gastroenterology;  Laterality: N/A;   COLONOSCOPY WITH PROPOFOL  N/A 06/17/2021   Procedure: COLONOSCOPY WITH PROPOFOL ;  Surgeon: Dianna Specking, MD;  Location: [facility];  Service: Endoscopy;  Laterality: N/A;   CYSTOSCOPY WITH INJECTION N/A 01/02/2023   Procedure: CYSTOSCOPY WITH LUEVENIA;  Surgeon: Elisabeth Valli BIRCH, MD;  Location: [facility];  Service: Urology;  Laterality: N/A;  45 MINS   ESOPHAGOGASTRODUODENOSCOPY (EGD) WITH PROPOFOL  N/A 06/17/2021   Procedure: ESOPHAGOGASTRODUODENOSCOPY (EGD) WITH PROPOFOL ;  Surgeon: Dianna Specking, MD;  Location: [facility];  Service: Endoscopy;  Laterality: N/A;   IR ANGIOGRAM SELECTIVE EACH ADDITIONAL VESSEL  04/06/2024   IR ANGIOGRAM SELECTIVE EACH ADDITIONAL VESSEL  04/06/2024   IR ANGIOGRAM SELECTIVE EACH ADDITIONAL VESSEL  04/06/2024   IR ANGIOGRAM SELECTIVE EACH ADDITIONAL VESSEL  04/06/2024   IR ANGIOGRAM SELECTIVE EACH ADDITIONAL VESSEL  04/06/2024   IR ANGIOGRAM SELECTIVE EACH ADDITIONAL VESSEL  04/06/2024   IR ANGIOGRAM VISCERAL SELECTIVE  04/06/2024   IR EMBO ART  VEN HEMORR LYMPH EXTRAV  INC GUIDE ROADMAPPING  04/06/2024   IR US  GUIDE VASC ACCESS RIGHT  04/06/2024   KIDNEY SURGERY  1998   Right.  growth removed   POLYPECTOMY  06/17/2021   Procedure: POLYPECTOMY;  Surgeon: Dianna Specking, MD;  Location: [facility];  Service: Endoscopy;;   POLYPECTOMY  07/28/2022   Procedure: POLYPECTOMY;  Surgeon: Rosalie Kitchens, MD;  Location: [facility];  Service: Gastroenterology;;   rectal prolapse repair  12/05/2011   ROTATOR CUFF REPAIR     Left   SHOULDER ARTHROSCOPY WITH SUBACROMIAL DECOMPRESSION, ROTATOR CUFF REPAIR AND BICEP TENDON REPAIR Left 05/25/2015   Procedure:  SHOULDER ARTHROSCOPY WITH SUBACROMIAL DECOMPRESSION, ROTATOR CUFF REPAIR AND BICEP TENODESIS, DEBRIDEMENT. ;  Surgeon: Glendia Cordella Hutchinson, MD;  Location: [facility];  Service: Orthopedics;  Laterality: Left;  LEFT SHOULDER ROTATOR CUFF TEAR REPAIR, ARTHROSCOPY, DEBRIDEMENT, BICEPS TENODESIS, SUBACROMIAL DECOMPRESSION.   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  Tobacco Use   Smoking status: Former    Current packs/day: 0.00    Types: Cigarettes    Quit date: 11/13/1980    Years since quitting: 43.8   Smokeless tobacco: Never   Tobacco comments:    quit in 1982  Vaping Use   Vaping status: Never Used  Substance and Sexual Activity   Alcohol  use: No    Alcohol /week: 0.0 standard drinks of alcohol    Drug use: No   Sexual activity: Yes

## 2024-09-23 NOTE — Progress Notes (Signed)
 Pain Scale   Average Pain 8 Patient advising she has lower back pain radiating to right leg, patient advising her pain is constant.        +Driver, -BT, -Dye Allergies.

## 2024-10-05 ENCOUNTER — Inpatient Hospital Stay (HOSPITAL_COMMUNITY)
Admission: EM | Admit: 2024-10-05 | Discharge: 2024-10-12 | DRG: 378 | Disposition: A | Discharge status: Home / Self Care | Attending: Internal Medicine | Admitting: Internal Medicine

## 2024-10-05 ENCOUNTER — Other Ambulatory Visit: Payer: Self-pay

## 2024-10-05 ENCOUNTER — Emergency Department (HOSPITAL_COMMUNITY)

## 2024-10-05 ENCOUNTER — Encounter (HOSPITAL_COMMUNITY): Payer: Self-pay

## 2024-10-05 ENCOUNTER — Inpatient Hospital Stay (HOSPITAL_COMMUNITY)

## 2024-10-05 DIAGNOSIS — D649 Anemia, unspecified: Secondary | ICD-10-CM | POA: Diagnosis not present

## 2024-10-05 DIAGNOSIS — E1169 Type 2 diabetes mellitus with other specified complication: Secondary | ICD-10-CM | POA: Diagnosis not present

## 2024-10-05 DIAGNOSIS — I129 Hypertensive chronic kidney disease with stage 1 through stage 4 chronic kidney disease, or unspecified chronic kidney disease: Secondary | ICD-10-CM | POA: Diagnosis not present

## 2024-10-05 DIAGNOSIS — Z8249 Family history of ischemic heart disease and other diseases of the circulatory system: Secondary | ICD-10-CM

## 2024-10-05 DIAGNOSIS — Z888 Allergy status to other drugs, medicaments and biological substances status: Secondary | ICD-10-CM

## 2024-10-05 DIAGNOSIS — N1832 Chronic kidney disease, stage 3b: Secondary | ICD-10-CM | POA: Diagnosis present

## 2024-10-05 DIAGNOSIS — Z90710 Acquired absence of both cervix and uterus: Secondary | ICD-10-CM

## 2024-10-05 DIAGNOSIS — N200 Calculus of kidney: Secondary | ICD-10-CM | POA: Diagnosis not present

## 2024-10-05 DIAGNOSIS — D62 Acute posthemorrhagic anemia: Secondary | ICD-10-CM | POA: Diagnosis not present

## 2024-10-05 DIAGNOSIS — E1151 Type 2 diabetes mellitus with diabetic peripheral angiopathy without gangrene: Secondary | ICD-10-CM | POA: Diagnosis present

## 2024-10-05 DIAGNOSIS — K922 Gastrointestinal hemorrhage, unspecified: Secondary | ICD-10-CM | POA: Diagnosis not present

## 2024-10-05 DIAGNOSIS — K2970 Gastritis, unspecified, without bleeding: Secondary | ICD-10-CM | POA: Diagnosis not present

## 2024-10-05 DIAGNOSIS — K921 Melena: Secondary | ICD-10-CM | POA: Diagnosis not present

## 2024-10-05 DIAGNOSIS — I959 Hypotension, unspecified: Secondary | ICD-10-CM | POA: Diagnosis not present

## 2024-10-05 DIAGNOSIS — Z91011 Allergy to milk products, unspecified: Secondary | ICD-10-CM

## 2024-10-05 DIAGNOSIS — K219 Gastro-esophageal reflux disease without esophagitis: Secondary | ICD-10-CM | POA: Diagnosis present

## 2024-10-05 DIAGNOSIS — Z79899 Other long term (current) drug therapy: Secondary | ICD-10-CM

## 2024-10-05 DIAGNOSIS — S098XXA Other specified injuries of head, initial encounter: Secondary | ICD-10-CM | POA: Diagnosis not present

## 2024-10-05 DIAGNOSIS — E7800 Pure hypercholesterolemia, unspecified: Secondary | ICD-10-CM | POA: Diagnosis not present

## 2024-10-05 DIAGNOSIS — Z7984 Long term (current) use of oral hypoglycemic drugs: Secondary | ICD-10-CM

## 2024-10-05 DIAGNOSIS — F0390 Unspecified dementia without behavioral disturbance: Secondary | ICD-10-CM | POA: Diagnosis not present

## 2024-10-05 DIAGNOSIS — Z885 Allergy status to narcotic agent status: Secondary | ICD-10-CM

## 2024-10-05 DIAGNOSIS — I10 Essential (primary) hypertension: Secondary | ICD-10-CM | POA: Diagnosis not present

## 2024-10-05 DIAGNOSIS — N281 Cyst of kidney, acquired: Secondary | ICD-10-CM | POA: Diagnosis not present

## 2024-10-05 DIAGNOSIS — K5731 Diverticulosis of large intestine without perforation or abscess with bleeding: Secondary | ICD-10-CM | POA: Diagnosis not present

## 2024-10-05 DIAGNOSIS — E1122 Type 2 diabetes mellitus with diabetic chronic kidney disease: Secondary | ICD-10-CM | POA: Diagnosis present

## 2024-10-05 DIAGNOSIS — X58XXXA Exposure to other specified factors, initial encounter: Secondary | ICD-10-CM | POA: Diagnosis not present

## 2024-10-05 DIAGNOSIS — R001 Bradycardia, unspecified: Secondary | ICD-10-CM | POA: Diagnosis not present

## 2024-10-05 DIAGNOSIS — Z87891 Personal history of nicotine dependence: Secondary | ICD-10-CM

## 2024-10-05 DIAGNOSIS — G4733 Obstructive sleep apnea (adult) (pediatric): Secondary | ICD-10-CM | POA: Diagnosis present

## 2024-10-05 HISTORY — PX: IR US GUIDE VASC ACCESS RIGHT: IMG2390

## 2024-10-05 HISTORY — PX: IR ANGIOGRAM VISCERAL SELECTIVE: IMG657

## 2024-10-05 HISTORY — PX: IR ANGIOGRAM SELECTIVE EACH ADDITIONAL VESSEL: IMG667

## 2024-10-05 LAB — CBC
HCT: 35.6 % — ABNORMAL LOW (ref 36.0–46.0)
Hemoglobin: 11.4 g/dL — ABNORMAL LOW (ref 12.0–15.0)
MCH: 29.6 pg (ref 26.0–34.0)
MCHC: 32 g/dL (ref 30.0–36.0)
MCV: 92.5 fL (ref 80.0–100.0)
Platelets: 264 K/uL (ref 150–400)
RBC: 3.85 MIL/uL — ABNORMAL LOW (ref 3.87–5.11)
RDW: 13.4 % (ref 11.5–15.5)
WBC: 3.5 K/uL — ABNORMAL LOW (ref 4.0–10.5)
nRBC: 0 % (ref 0.0–0.2)

## 2024-10-05 LAB — HEMOGLOBIN A1C
Hgb A1c MFr Bld: 6.4 % — ABNORMAL HIGH (ref 4.8–5.6)
Mean Plasma Glucose: 136.98 mg/dL

## 2024-10-05 LAB — COMPREHENSIVE METABOLIC PANEL WITH GFR
ALT: 16 U/L (ref 0–44)
AST: 34 U/L (ref 15–41)
Albumin: 4.1 g/dL (ref 3.5–5.0)
Alkaline Phosphatase: 80 U/L (ref 38–126)
Anion gap: 10 (ref 5–15)
BUN: 23 mg/dL (ref 8–23)
CO2: 23 mmol/L (ref 22–32)
Calcium: 9.8 mg/dL (ref 8.9–10.3)
Chloride: 107 mmol/L (ref 98–111)
Creatinine, Ser: 1.57 mg/dL — ABNORMAL HIGH (ref 0.44–1.00)
GFR, Estimated: 32 mL/min — ABNORMAL LOW (ref >60)
Glucose, Bld: 65 mg/dL — ABNORMAL LOW (ref 70–99)
Potassium: 4.8 mmol/L (ref 3.5–5.1)
Sodium: 141 mmol/L (ref 135–145)
Total Bilirubin: 0.3 mg/dL (ref 0.0–1.2)
Total Protein: 7.4 g/dL (ref 6.5–8.1)

## 2024-10-05 LAB — HEMOGLOBIN AND HEMATOCRIT, BLOOD
HCT: 27.2 % — ABNORMAL LOW (ref 36.0–46.0)
HCT: 34.5 % — ABNORMAL LOW (ref 36.0–46.0)
Hemoglobin: 8.7 g/dL — ABNORMAL LOW (ref 12.0–15.0)
Hemoglobin: 10.4 g/dL — ABNORMAL LOW (ref 12.0–15.0)

## 2024-10-05 LAB — GLUCOSE, CAPILLARY
Glucose-Capillary: 65 mg/dL — ABNORMAL LOW (ref 70–99)
Glucose-Capillary: 118 mg/dL — ABNORMAL HIGH (ref 70–99)

## 2024-10-05 LAB — PREPARE RBC (CROSSMATCH)

## 2024-10-05 LAB — POC OCCULT BLOOD, ED: Fecal Occult Bld: POSITIVE — AB

## 2024-10-05 MED ORDER — ACETAMINOPHEN 325 MG PO TABS
650.0000 mg | ORAL_TABLET | Freq: Four times a day (QID) | ORAL | Status: DC | PRN
  Administered 2024-10-06 – 2024-10-07 (×3): 650 mg via ORAL
  Filled 2024-10-05 (×3): qty 2

## 2024-10-05 MED ORDER — FENTANYL CITRATE (PF) 100 MCG/2ML IJ SOLN
INTRAMUSCULAR | Status: AC | PRN
Start: 2024-10-05 — End: 2024-10-05
  Administered 2024-10-05 (×2): 25 ug via INTRAVENOUS
  Administered 2024-10-05: 50 ug via INTRAVENOUS

## 2024-10-05 MED ORDER — DEXTROSE 50 % IV SOLN
INTRAVENOUS | Status: AC
  Administered 2024-10-05: 12.5 g via INTRAVENOUS
  Filled 2024-10-05: qty 50

## 2024-10-05 MED ORDER — ALBUTEROL SULFATE (2.5 MG/3ML) 0.083% IN NEBU: 2.5000 mg | INHALATION_SOLUTION | RESPIRATORY_TRACT | Status: DC | PRN

## 2024-10-05 MED ORDER — DEXTROSE 50 % IV SOLN: 12.5000 g | INTRAVENOUS | Status: AC

## 2024-10-05 MED ORDER — IOHEXOL 350 MG/ML SOLN
100.0000 mL | Freq: Once | INTRAVENOUS | Status: AC | PRN
Start: 2024-10-05 — End: 2024-10-05
  Administered 2024-10-05: 100 mL via INTRAVENOUS

## 2024-10-05 MED ORDER — INSULIN ASPART 100 UNIT/ML IJ SOLN: 0.0000 [IU] | INTRAMUSCULAR | Status: DC

## 2024-10-05 MED ORDER — CHLORHEXIDINE GLUCONATE CLOTH 2 % EX PADS
6.0000 | MEDICATED_PAD | Freq: Every day | CUTANEOUS | Status: DC
  Administered 2024-10-05: 6 via TOPICAL

## 2024-10-05 MED ORDER — MIDAZOLAM HCL 5 MG/5ML IJ SOLN
INTRAMUSCULAR | Status: AC | PRN
  Administered 2024-10-05: .5 mg via INTRAVENOUS

## 2024-10-05 MED ORDER — SODIUM CHLORIDE 0.9% IV SOLUTION: Freq: Once | INTRAVENOUS | Status: DC

## 2024-10-05 MED ORDER — METOPROLOL SUCCINATE ER 25 MG PO TB24: 50.0000 mg | ORAL_TABLET | Freq: Every day | ORAL | Status: DC

## 2024-10-05 MED ORDER — ONDANSETRON HCL 4 MG/2ML IJ SOLN
4.0000 mg | Freq: Four times a day (QID) | INTRAMUSCULAR | Status: DC | PRN
  Administered 2024-10-05: 4 mg via INTRAVENOUS
  Filled 2024-10-05: qty 2

## 2024-10-05 MED ORDER — LIDOCAINE HCL 1 % IJ SOLN
20.0000 mL | Freq: Once | INTRAMUSCULAR | Status: AC
  Administered 2024-10-05: 10 mL

## 2024-10-05 MED ORDER — LIDOCAINE HCL 1 % IJ SOLN
INTRAMUSCULAR | Status: AC
  Filled 2024-10-05: qty 20

## 2024-10-05 MED ORDER — IOHEXOL 300 MG/ML  SOLN
100.0000 mL | Freq: Once | INTRAMUSCULAR | Status: AC | PRN
  Administered 2024-10-05: 60 mL via INTRA_ARTERIAL

## 2024-10-05 MED ORDER — MIDAZOLAM HCL 2 MG/2ML IJ SOLN
INTRAMUSCULAR | Status: AC
Start: 2024-10-05 — End: 2024-10-05
  Filled 2024-10-05: qty 2

## 2024-10-05 MED ORDER — SODIUM CHLORIDE 0.9 % IV SOLN
INTRAVENOUS | Status: AC | PRN
  Administered 2024-10-05: 10 mL/h via INTRAVENOUS

## 2024-10-05 MED ORDER — SODIUM CHLORIDE 0.9 % IV BOLUS
1000.0000 mL | Freq: Once | INTRAVENOUS | Status: AC
  Administered 2024-10-05: 1000 mL via INTRAVENOUS

## 2024-10-05 MED ORDER — HYDRALAZINE HCL 20 MG/ML IJ SOLN
5.0000 mg | Freq: Four times a day (QID) | INTRAMUSCULAR | Status: DC | PRN
Start: 2024-10-05 — End: 2024-10-05
  Administered 2024-10-05: 5 mg via INTRAVENOUS
  Filled 2024-10-05: qty 1

## 2024-10-05 MED ORDER — AMLODIPINE BESYLATE 5 MG PO TABS: 2.5000 mg | ORAL_TABLET | Freq: Every day | ORAL | Status: DC

## 2024-10-05 MED ORDER — ACETAMINOPHEN 650 MG RE SUPP: 650.0000 mg | Freq: Four times a day (QID) | RECTAL | Status: DC | PRN

## 2024-10-05 MED ORDER — TRAZODONE HCL 50 MG PO TABS
25.0000 mg | ORAL_TABLET | Freq: Every evening | ORAL | Status: DC | PRN
  Administered 2024-10-06 (×2): 25 mg via ORAL
  Filled 2024-10-05 (×2): qty 1

## 2024-10-05 MED ORDER — MIDAZOLAM HCL (PF) 2 MG/2ML IJ SOLN
INTRAMUSCULAR | Status: AC | PRN
  Administered 2024-10-05: .5 mg via INTRAVENOUS
  Administered 2024-10-05: 1 mg via INTRAVENOUS

## 2024-10-05 MED ORDER — HYDRALAZINE HCL 20 MG/ML IJ SOLN
5.0000 mg | INTRAMUSCULAR | Status: DC | PRN
  Administered 2024-10-06 – 2024-10-10 (×3): 5 mg via INTRAVENOUS
  Filled 2024-10-05 (×3): qty 1

## 2024-10-05 MED ORDER — ORAL CARE MOUTH RINSE: 15.0000 mL | OROMUCOSAL | Status: DC | PRN

## 2024-10-05 MED ORDER — ONDANSETRON HCL 4 MG PO TABS: 4.0000 mg | ORAL_TABLET | Freq: Four times a day (QID) | ORAL | Status: DC | PRN

## 2024-10-05 MED ORDER — FENTANYL CITRATE (PF) 100 MCG/2ML IJ SOLN
INTRAMUSCULAR | Status: AC
  Filled 2024-10-05: qty 2

## 2024-10-05 NOTE — ED Provider Notes (Signed)
  Physical Exam  BP (!) 160/75   Pulse 73   Temp 97.6 F (36.4 C) (Oral)   Resp 17   SpO2 100%   Physical Exam  Procedures  Procedures  ED Course / MDM    Medical Decision Making Care assumed at 3 pm.  Patient is here with bright red blood per rectum.  Patient had 2 episodes prior to arrival and then no episode in the ER.  Signout pending CTA to rule out acute bleeding.  Of note, patient had embolization done earlier this year.  Patient also follows with [facility].  5:54 PM Repeat hemoglobin dropped to 8.7 from 11 earlier today.  CTA showed active bleeding in the terminal ileum.  I discussed case with Dr. Philip from interventional radiology.  He will take the patient emergently to IR for embolization.  Discussed with Dr. Roxane from hospitalist who will admit.  I also ordered 2 units of emergent blood.  I also notified Dr. Rosalie from GI who will see patient tomorrow.  Patient will be admitted for lower GI bleed   CRITICAL CARE Performed by: Alm VEAR Cave   Total critical care time: 38 minutes  Critical care time was exclusive of separately billable procedures and treating other patients.  Critical care was necessary to treat or prevent imminent or life-threatening deterioration.  Critical care was time spent personally by me on the following activities: development of treatment plan with patient and/or surrogate as well as nursing, discussions with consultants, evaluation of patient's response to treatment, examination of patient, obtaining history from patient or surrogate, ordering and performing treatments and interventions, ordering and review of laboratory studies, ordering and review of radiographic studies, pulse oximetry and re-evaluation of patient's condition.   CRITICAL CARE Performed by: Alm VEAR Cave   Total critical care time: 45 minutes  Critical care time was exclusive of separately billable procedures and treating other patients.  Critical care was necessary to  treat or prevent imminent or life-threatening deterioration.  Critical care was time spent personally by me on the following activities: development of treatment plan with patient and/or surrogate as well as nursing, discussions with consultants, evaluation of patient's response to treatment, examination of patient, obtaining history from patient or surrogate, ordering and performing treatments and interventions, ordering and review of laboratory studies, ordering and review of radiographic studies, pulse oximetry and re-evaluation of patient's condition.   Problems Addressed: Lower GI bleed: acute illness or injury  Amount and/or Complexity of Data Reviewed Labs: ordered. Decision-making details documented in ED Course. Radiology: ordered and independent interpretation performed. Decision-making details documented in ED Course.  Risk Prescription drug management.         Cave Alm Macho, MD 10/05/24 [phone]

## 2024-10-05 NOTE — Procedures (Signed)
 Interventional Radiology Procedure:   Indications: GI bleed  Procedure: Visceral arteriography  Findings: No active bleeding identified in ileocolic region.  Right groin sheath removed with Angioseal device.    Complications: None     EBL: Minimal  Plan:  Bedrest 3 hours.  Follow hemoglobin.    Dylana Shaw R. Philip, MD  Pager: [phone]

## 2024-10-05 NOTE — H&P (Signed)
 History and Physical  SOLENE HEREFORD FMW:[id number] DOB: 11/25/1940 DOA: 10/05/2024  PCP: Sun, Vyvyan, MD   Chief Complaint: Bright red blood per rectum  HPI: Stephanie Barber is a 83 y.o. female with medical history significant for non-insulin -dependent diabetes, hypertension, hyperlipidemia and hospitalization in May of this year for bleeding colonic pseudoaneurysm being admitted to the hospital with recurrent lower GI bleed and acute blood loss anemia.  History is provided by the patient as well as her husband who is at the bedside in the ER this evening.  She was in her usual state of health until about 22 hours ago when she had sudden onset of bright red blood with dark clots per rectum.  She denies any dizziness or lightheadedness, any chest pain, but she has been having some cramping abdominal discomfort during the day today.  In the emergency department, patient has been hemodynamically stable but has had continued bright red blood per rectum, initial hemoglobin was stable at 11.4, but repeat at 4:30 PM shows a drop to 8.7.  ER provider has consulted with [facility] gastroenterology, in the meantime urgent IR consultation was also requested due to CT angio bleeding study which is positive for active bleed and they plan to intervene this evening.  Review of Systems: Please see HPI for pertinent positives and negatives. A complete 10 system review of systems are otherwise negative.  Past Medical History:  Diagnosis Date   Arthritis    Chronic headache    Complication of anesthesia    allergy to Novocaine    Diabetes mellitus    GERD (gastroesophageal reflux disease)    GI bleed    Heart murmur    High cholesterol    Hyperlipidemia    Hypertension    Intracranial atherosclerosis    per MRA   Migraine    Narcolepsy and cataplexy    OSA    states has not used CPAP in over a year   Peripheral vascular disease    Rectal prolapse    Stage 3b chronic kidney disease (CKD) (HCC)     Trigger finger of all digits of right hand    Past Surgical History:  Procedure Laterality Date   ABDOMINAL HYSTERECTOMY     Fibroids   BACK SURGERY     L4,L5 discectomy   BOTOX  INJECTION N/A 01/02/2023   Procedure: BOTOX  INJECTION;  Surgeon: Elisabeth Valli BIRCH, MD;  Location: [facility];  Service: Urology;  Laterality: N/A;   CARDIAC CATHETERIZATION     CARPAL TUNNEL RELEASE Right 01/18/2017   COLONOSCOPY N/A 07/28/2022   Procedure: COLONOSCOPY;  Surgeon: Rosalie Kitchens, MD;  Location: [facility];  Service: Gastroenterology;  Laterality: N/A;   COLONOSCOPY WITH PROPOFOL  N/A 06/17/2021   Procedure: COLONOSCOPY WITH PROPOFOL ;  Surgeon: Dianna Specking, MD;  Location: [facility];  Service: Endoscopy;  Laterality: N/A;   CYSTOSCOPY WITH INJECTION N/A 01/02/2023   Procedure: CYSTOSCOPY WITH LUEVENIA;  Surgeon: Elisabeth Valli BIRCH, MD;  Location: [facility];  Service: Urology;  Laterality: N/A;  45 MINS   ESOPHAGOGASTRODUODENOSCOPY (EGD) WITH PROPOFOL  N/A 06/17/2021   Procedure: ESOPHAGOGASTRODUODENOSCOPY (EGD) WITH PROPOFOL ;  Surgeon: Dianna Specking, MD;  Location: [facility];  Service: Endoscopy;  Laterality: N/A;   IR ANGIOGRAM SELECTIVE EACH ADDITIONAL VESSEL  04/06/2024   IR ANGIOGRAM SELECTIVE EACH ADDITIONAL VESSEL  04/06/2024   IR ANGIOGRAM SELECTIVE EACH ADDITIONAL VESSEL  04/06/2024   IR ANGIOGRAM SELECTIVE EACH ADDITIONAL VESSEL  04/06/2024   IR ANGIOGRAM SELECTIVE EACH ADDITIONAL VESSEL  04/06/2024  IR ANGIOGRAM SELECTIVE EACH ADDITIONAL VESSEL  04/06/2024   IR ANGIOGRAM VISCERAL SELECTIVE  04/06/2024   IR EMBO ART  VEN HEMORR LYMPH EXTRAV  INC GUIDE ROADMAPPING  04/06/2024   IR US  GUIDE VASC ACCESS RIGHT  04/06/2024   KIDNEY SURGERY  1998   Right.  growth removed   POLYPECTOMY  06/17/2021   Procedure: POLYPECTOMY;  Surgeon: Dianna Specking, MD;  Location: [facility];  Service: Endoscopy;;   POLYPECTOMY  07/28/2022   Procedure: POLYPECTOMY;  Surgeon: Rosalie Kitchens, MD;  Location: [facility];  Service: Gastroenterology;;   rectal prolapse repair  12/05/2011   ROTATOR CUFF REPAIR     Left   SHOULDER ARTHROSCOPY WITH SUBACROMIAL DECOMPRESSION, ROTATOR CUFF REPAIR AND BICEP TENDON REPAIR Left 05/25/2015   Procedure: SHOULDER ARTHROSCOPY WITH SUBACROMIAL DECOMPRESSION, ROTATOR CUFF REPAIR AND BICEP TENODESIS, DEBRIDEMENT. ;  Surgeon: Glendia Cordella Hutchinson, MD;  Location: [facility];  Service: Orthopedics;  Laterality: Left;  LEFT SHOULDER ROTATOR CUFF TEAR REPAIR, ARTHROSCOPY, DEBRIDEMENT, BICEPS TENODESIS, SUBACROMIAL DECOMPRESSION.   TOTAL ABDOMINAL HYSTERECTOMY     TRIGGER FINGER RELEASE Right 04/30/2018   Procedure: RELEASE TRIGGER FINGER/A-1 PULLEY RIGHT MIDDLE  AND SMALLn ;  Surgeon: Murrell Kuba, MD;  Location: [facility];  Service: Orthopedics;  Laterality: Right;  FAB, Bier block   TUBAL LIGATION     Social History:  reports that she quit smoking about 43 years ago. Her smoking use included cigarettes. She has never used smokeless tobacco. She reports that she does not drink alcohol  and does not use drugs.  Allergies  Allergen Reactions   Codeine Anaphylaxis   Morphine And Codeine Anaphylaxis   Donepezil  Diarrhea   Lactose Intolerance (Gi) Diarrhea and Nausea Only   Mirabegron     Other Reaction(s): Caused her to go to the hospital   Morphine Sulfate     Other Reaction(s): bottom out   Rivaroxaban      Other Reaction(s): rectal bleeding    Family History  Problem Relation Age of Onset   Heart disease Mother    Throat cancer Father    Cancer Father        stomach   Kidney cancer Brother    Heart disease Brother      Prior to Admission medications   Medication Sig Start Date End Date Taking? Authorizing Provider  acetaminophen  (TYLENOL ) 500 MG tablet Take 500 mg by mouth daily.    [provider]  amLODipine  (NORVASC ) 2.5 MG tablet Take 1 tablet (2.5 mg total) by mouth daily. 04/08/24   Rai, Nydia POUR, MD  metFORMIN  (GLUCOPHAGE ) 500  MG tablet Take 500 mg by mouth daily with breakfast.    [provider]  methocarbamol  (ROBAXIN ) 500 MG tablet Take 1 tablet (500 mg total) by mouth every 8 (eight) hours as needed for muscle spasms. 05/14/24   Persons, Ronal Dragon, PA  metoprolol  succinate (TOPROL -XL) 50 MG 24 hr tablet Take 50 mg by mouth in the morning.    [provider]  ondansetron  (ZOFRAN ) 4 MG tablet Take 1 tablet (4 mg total) by mouth every 8 (eight) hours as needed for nausea or vomiting. 04/08/24   Rai, Nydia POUR, MD  pantoprazole  (PROTONIX ) 40 MG tablet Take 1 tablet (40 mg total) by mouth daily before breakfast. 04/08/24 04/08/25  Rai, Ripudeep K, MD  rosuvastatin  (CRESTOR ) 40 MG tablet Take 1 tablet (40 mg total) by mouth daily. 07/30/24   Michele Richardson, DO    Physical Exam: BP (!) 160/75  Pulse 73   Temp 97.6 F (36.4 C) (Oral)   Resp 17   SpO2 100%  General:  Alert, oriented, calm, in no acute distress  Eyes: EOMI, clear conjuctivae, white sclerea Neck: supple, no masses, trachea mildline  Cardiovascular: RRR, no murmurs or rubs, no peripheral edema  Respiratory: clear to auscultation bilaterally, no wheezes, no crackles  Abdomen: soft, nontender, nondistended, normal bowel tones heard  Skin: dry, no rashes  Musculoskeletal: no joint effusions, normal range of motion  Psychiatric: appropriate affect, normal speech  Neurologic: extraocular muscles intact, clear speech, moving all extremities with intact sensorium         Labs on Admission:  Basic Metabolic Panel: Recent Labs  Lab 10/05/24 1129  NA 141  K 4.8  CL 107  CO2 23  GLUCOSE 65*  BUN 23  CREATININE 1.57*  CALCIUM  9.8   Liver Function Tests: Recent Labs  Lab 10/05/24 1129  AST 34  ALT 16  ALKPHOS 80  BILITOT 0.3  PROT 7.4  ALBUMIN 4.1   No results for input(s): LIPASE, AMYLASE in the last 168 hours. No results for input(s): AMMONIA in the last 168 hours. CBC: Recent Labs  Lab 10/05/24 1129 10/05/24 1635   WBC 3.5*  --   HGB 11.4* 8.7*  HCT 35.6* 27.2*  MCV 92.5  --   PLT 264  --    Cardiac Enzymes: No results for input(s): CKTOTAL, CKMB, CKMBINDEX, TROPONINI in the last 168 hours. BNP (last 3 results) No results for input(s): BNP in the last 8760 hours.  ProBNP (last 3 results) No results for input(s): PROBNP in the last 8760 hours.  CBG: No results for input(s): GLUCAP in the last 168 hours.  Radiological Exams on Admission: CT ANGIO GI BLEED Result Date: 10/05/2024 CLINICAL DATA:  Lower GI bleed. Pseudoaneurysm in the terminal ileum. EXAM: CTA ABDOMEN AND PELVIS WITHOUT AND WITH CONTRAST TECHNIQUE: Multidetector CT imaging of the abdomen and pelvis was performed using the standard protocol during bolus administration of intravenous contrast. Multiplanar reconstructed images and MIPs were obtained and reviewed to evaluate the vascular anatomy. RADIATION DOSE REDUCTION: This exam was performed according to the departmental dose-optimization program which includes automated exposure control, adjustment of the mA and/or kV according to patient size and/or use of iterative reconstruction technique. CONTRAST:  OMNIPAQUE  IOHEXOL  350 MG/ML SOLN COMPARISON:  CT abdomen pelvis dated 04/06/2024. FINDINGS: VASCULAR Aorta: Mild atherosclerotic calcification of the abdominal aorta. No dilatation or dissection. No periaortic fluid collection. Celiac: Patent without evidence of aneurysm, dissection, vasculitis or significant stenosis. SMA: Patent without evidence of aneurysm, dissection, vasculitis or significant stenosis. Renals: Both renal arteries are patent without evidence of aneurysm, dissection, vasculitis, fibromuscular dysplasia or significant stenosis. IMA: Patent without evidence of aneurysm, dissection, vasculitis or significant stenosis. Inflow: Mild atherosclerotic calcification of the iliac arteries. No aneurysmal dilatation or dissection. The iliac arteries are patent.  Proximal Outflow: The visualized proximal fluid patent. Veins: The IVC is unremarkable. The SMV, splenic vein, and main portal vein are patent. No portal venous gas. Review of the MIP images confirms the above findings. NON-VASCULAR Lower chest: The visualized lung bases are clear. No intra-abdominal free air or free fluid. Hepatobiliary: No focal liver abnormality is seen. No gallstones, gallbladder wall thickening, or biliary dilatation. Pancreas: Unremarkable. No pancreatic ductal dilatation or surrounding inflammatory changes. Spleen: Normal in size without focal abnormality. Adrenals/Urinary Tract: The adrenal glands unremarkable. Bilateral renal parenchyma atrophy and cortical scarring. There is an 8 mm by stone  in the upper pole of the left kidney. A 2 mm stone in the interpolar right kidney. No hydronephrosis on either side. Small bilateral renal cysts. The visualized ureters and urinary bladder appear unremarkable. Stomach/Bowel: Postsurgical changes of the rectosigmoid with anastomotic staple line. There is colonic diverticulosis. There is no bowel obstruction or active inflammation. Progressive contrast pooling in the terminal ileum on postcontrast images consistent with active GI bleed. The appendix is normal. Lymphatic: No adenopathy. Reproductive: Hysterectomy.  No suspicious adnexal masses. Other: None Musculoskeletal: Degenerative changes of the spine and osteopenia. No acute osseous pathology. IMPRESSION: 1. Active GI bleed in the terminal ileum. 2. Colonic diverticulosis. No bowel obstruction. Normal appendix. 3. Bilateral nonobstructing renal calculi. No hydronephrosis. 4.  Aortic Atherosclerosis (ICD10-I70.0). Electronically Signed   By: Vanetta Chou M.D.   On: 10/05/2024 16:45   Assessment/Plan Stephanie Barber is a 83 y.o. female with medical history significant for non-insulin -dependent diabetes, hypertension, hyperlipidemia and hospitalization in May of this year for bleeding colonic  pseudoaneurysm being admitted to the hospital with recurrent lower GI bleed and acute blood loss anemia.   Acute lower GI bleeding, and acute on chronic blood loss anemia-patient is hemodynamically stable, but with evidence of active GI bleed, she had a large bloody bowel movement here in the emergency department.  CT angio with evidence of active bleeding in the terminal ileum. -Inpatient admission to stepdown -Keep n.p.o. -ER provider has discussed with IR and they plan for intervention tonight -Transfused 2 units PRBC -Monitor every 8 hour hemoglobin  Hypertension-due to active bleed, will plan to hold home Toprol -XL and amlodipine  for tonight, will resume these in the morning with holding parameters  Non-insulin -dependent type 2 diabetes-carb modified diet when eating, sliding scale insulin  in the meantime  DVT prophylaxis: SCDs only    Code Status: Full Code  Consults called: IR, GI  Admission status: The appropriate patient status for this patient is INPATIENT. Inpatient status is judged to be reasonable and necessary in order to provide the required intensity of service to ensure the patient's safety. The patient's presenting symptoms, physical exam findings, and initial radiographic and laboratory data in the context of their chronic comorbidities is felt to place them at high risk for further clinical deterioration. Furthermore, it is not anticipated that the patient will be medically stable for discharge from the hospital within 2 midnights of admission.    I certify that at the point of admission it is my clinical judgment that the patient will require inpatient hospital care spanning beyond 2 midnights from the point of admission due to high intensity of service, high risk for further deterioration and high frequency of surveillance required  Time spent: 59 minutes  Nimisha Rathel CHRISTELLA Gail MD [facility] Pager [phone]  If 7PM-7AM, please contact  night-coverage [email or web address] Password [email or web address]  10/05/2024, 5:54 PM

## 2024-10-05 NOTE — ED Notes (Signed)
 Patient transported to CT

## 2024-10-05 NOTE — ED Provider Notes (Signed)
 [place] EMERGENCY DEPARTMENT AT [facility] Provider Note   CSN: [id number] Arrival date & time: 10/05/24  1037     Patient presents with: GI Bleeding   Stephanie Barber is a 83 y.o. female.   HPI   83 year old female with past medical history of lower GI bleed from a pseudoaneurysm in the terminal ileum that required coil embolization by gastroenterology presents emergency department with ongoing dark red bleeding from the rectum with black stool.  Patient is not on anticoagulation.  Her last issue was this was back in May when she had the above finding and treatment.  Patient states that her symptoms started last night, progressed through the night with multiple episodes.  She denies any chest pain, shortness of breath, lightheadedness or dizziness.  She continues to have some dark red bleeding per rectum.  Prior to Admission medications   Medication Sig Start Date End Date Taking? Authorizing Provider  acetaminophen  (TYLENOL ) 500 MG tablet Take 500 mg by mouth daily.    [provider]  amLODipine  (NORVASC ) 2.5 MG tablet Take 1 tablet (2.5 mg total) by mouth daily. 04/08/24   Rai, Nydia POUR, MD  metFORMIN  (GLUCOPHAGE ) 500 MG tablet Take 500 mg by mouth daily with breakfast.    [provider]  methocarbamol  (ROBAXIN ) 500 MG tablet Take 1 tablet (500 mg total) by mouth every 8 (eight) hours as needed for muscle spasms. 05/14/24   Persons, Ronal Dragon, PA  metoprolol  succinate (TOPROL -XL) 50 MG 24 hr tablet Take 50 mg by mouth in the morning.    [provider]  ondansetron  (ZOFRAN ) 4 MG tablet Take 1 tablet (4 mg total) by mouth every 8 (eight) hours as needed for nausea or vomiting. 04/08/24   Rai, Nydia POUR, MD  pantoprazole  (PROTONIX ) 40 MG tablet Take 1 tablet (40 mg total) by mouth daily before breakfast. 04/08/24 04/08/25  Rai, Ripudeep K, MD  rosuvastatin  (CRESTOR ) 40 MG tablet Take 1 tablet (40 mg total) by mouth daily. 07/30/24   Tolia,  Sunit, DO    Allergies: Codeine, Morphine and codeine, Donepezil , Lactose intolerance (gi), Mirabegron, Morphine sulfate, and Rivaroxaban     Review of Systems  Constitutional:  Positive for fatigue. Negative for fever.  Respiratory:  Negative for shortness of breath.   Cardiovascular:  Negative for chest pain.  Gastrointestinal:  Positive for anal bleeding and blood in stool. Negative for abdominal pain, diarrhea and vomiting.  Skin:  Negative for rash.  Neurological:  Negative for headaches.    Updated Vital Signs BP (!) 157/62   Pulse 61   Temp 97.6 F (36.4 C) (Oral)   Resp 17   SpO2 100%   Physical Exam Vitals and nursing note reviewed.  Constitutional:      Appearance: Normal appearance.  HENT:     Head: Normocephalic.     Mouth/Throat:     Mouth: Mucous membranes are moist.  Cardiovascular:     Rate and Rhythm: Normal rate.  Pulmonary:     Effort: Pulmonary effort is normal. No respiratory distress.  Abdominal:     Palpations: Abdomen is soft.     Tenderness: There is no abdominal tenderness.  Genitourinary:    Comments: Maroon blood per rectum Skin:    General: Skin is warm.  Neurological:     Mental Status: She is alert and oriented to person, place, and time. Mental status is at baseline.  Psychiatric:        Mood and Affect: Mood normal.     (  all labs ordered are listed, but only abnormal results are displayed) Labs Reviewed  COMPREHENSIVE METABOLIC PANEL WITH GFR - Abnormal; Notable for the following components:      Result Value   Glucose, Bld 65 (*)    Creatinine, Ser 1.57 (*)    GFR, Estimated 32 (*)    All other components within normal limits  CBC - Abnormal; Notable for the following components:   WBC 3.5 (*)    RBC 3.85 (*)    Hemoglobin 11.4 (*)    HCT 35.6 (*)    All other components within normal limits  POC OCCULT BLOOD, ED - Abnormal; Notable for the following components:   Fecal Occult Bld POSITIVE (*)    All other components  within normal limits  TYPE AND SCREEN    EKG: None  Radiology: No results found.   Procedures   Medications Ordered in the ED  iohexol  (OMNIPAQUE ) 350 MG/ML injection 100 mL (has no administration in time range)                                    Medical Decision Making Amount and/or Complexity of Data Reviewed Labs: ordered. Radiology: ordered.  Risk Prescription drug management.   83 year old female presents emergency department with concern for maroon blood per rectum and dark stools.  Multiple episodes overnight.  Not on anticoagulation.  Had similar symptoms a couple months ago and was treated for bleeding in the terminal ileum.  Vital signs are stable, abdomen is benign.  Rectal exam shows hematochezia, visibly positive.  Blood work is reassuring with a stable hemoglobin.  Will plan for CT angio GI bleed protocol to reevaluate for any acute bleeding.  Will repeat H&H and plan for gastroenterology consultation.  Patient signed out pending results.     Final diagnoses:  None    ED Discharge Orders     None          Bari Roxie HERO, DO 10/05/24 1457

## 2024-10-05 NOTE — Consult Note (Addendum)
 Chief Complaint: Patient was seen in consultation today for  Chief Complaint  Patient presents with   GI Bleeding    Referring Physician(s): Alm Cave  Patient Status: [facility] - ED  History of Present Illness: Stephanie Barber is a 83 y.o. 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  Past medical history significant for non-insulin -dependent diabetes, hypertension, hyperlipidemia   Patient has been in normal state of health until she noticed large dark blood clots in stool last night.  She reports the GI bleeding to be worse than it was in May.  She denies abdominal pain, shortness of breath, chest pain or syncope. She has been hemodynamically stable in ED but continues to have bloody stools.  CTA was positive for active bleeding at terminal ileum.   Past Medical History:  Diagnosis Date   Arthritis    Chronic headache    Complication of anesthesia    allergy to Novocaine    Diabetes mellitus    GERD (gastroesophageal reflux disease)    GI bleed    Heart murmur    High cholesterol    Hyperlipidemia    Hypertension    Intracranial atherosclerosis    per MRA   Migraine    Narcolepsy and cataplexy    OSA    states has not used CPAP in over a year   Peripheral vascular disease    Rectal prolapse    Stage 3b chronic kidney disease (CKD) (HCC)    Trigger finger of all digits of right hand     Past Surgical History:  Procedure Laterality Date   ABDOMINAL HYSTERECTOMY     Fibroids   BACK SURGERY     L4,L5 discectomy   BOTOX  INJECTION N/A 01/02/2023   Procedure: BOTOX  INJECTION;  Surgeon: Elisabeth Valli BIRCH, MD;  Location: [facility];  Service: Urology;  Laterality: N/A;   CARDIAC CATHETERIZATION     CARPAL TUNNEL RELEASE Right 01/18/2017   COLONOSCOPY N/A 07/28/2022   Procedure: COLONOSCOPY;  Surgeon: Rosalie Kitchens, MD;  Location: [facility];  Service: Gastroenterology;  Laterality: N/A;   COLONOSCOPY  WITH PROPOFOL  N/A 06/17/2021   Procedure: COLONOSCOPY WITH PROPOFOL ;  Surgeon: Dianna Specking, MD;  Location: [facility];  Service: Endoscopy;  Laterality: N/A;   CYSTOSCOPY WITH INJECTION N/A 01/02/2023   Procedure: CYSTOSCOPY WITH LUEVENIA;  Surgeon: Elisabeth Valli BIRCH, MD;  Location: [facility];  Service: Urology;  Laterality: N/A;  45 MINS   ESOPHAGOGASTRODUODENOSCOPY (EGD) WITH PROPOFOL  N/A 06/17/2021   Procedure: ESOPHAGOGASTRODUODENOSCOPY (EGD) WITH PROPOFOL ;  Surgeon: Dianna Specking, MD;  Location: [facility];  Service: Endoscopy;  Laterality: N/A;   IR ANGIOGRAM SELECTIVE EACH ADDITIONAL VESSEL  04/06/2024   IR ANGIOGRAM SELECTIVE EACH ADDITIONAL VESSEL  04/06/2024   IR ANGIOGRAM SELECTIVE EACH ADDITIONAL VESSEL  04/06/2024   IR ANGIOGRAM SELECTIVE EACH ADDITIONAL VESSEL  04/06/2024   IR ANGIOGRAM SELECTIVE EACH ADDITIONAL VESSEL  04/06/2024   IR ANGIOGRAM SELECTIVE EACH ADDITIONAL VESSEL  04/06/2024   IR ANGIOGRAM VISCERAL SELECTIVE  04/06/2024   IR EMBO ART  VEN HEMORR LYMPH EXTRAV  INC GUIDE ROADMAPPING  04/06/2024   IR US  GUIDE VASC ACCESS RIGHT  04/06/2024   KIDNEY SURGERY  1998   Right.  growth removed   POLYPECTOMY  06/17/2021   Procedure: POLYPECTOMY;  Surgeon: Dianna Specking, MD;  Location: [facility];  Service: Endoscopy;;   POLYPECTOMY  07/28/2022   Procedure: POLYPECTOMY;  Surgeon: Rosalie Kitchens,  MD;  Location: [facility];  Service: Gastroenterology;;   rectal prolapse repair  12/05/2011   ROTATOR CUFF REPAIR     Left   SHOULDER ARTHROSCOPY WITH SUBACROMIAL DECOMPRESSION, ROTATOR CUFF REPAIR AND BICEP TENDON REPAIR Left 05/25/2015   Procedure: SHOULDER ARTHROSCOPY WITH SUBACROMIAL DECOMPRESSION, ROTATOR CUFF REPAIR AND BICEP TENODESIS, DEBRIDEMENT. ;  Surgeon: Glendia Cordella Hutchinson, MD;  Location: [facility];  Service: Orthopedics;  Laterality: Left;  LEFT SHOULDER ROTATOR CUFF TEAR REPAIR, ARTHROSCOPY, DEBRIDEMENT, BICEPS TENODESIS, SUBACROMIAL DECOMPRESSION.   TOTAL ABDOMINAL  HYSTERECTOMY     TRIGGER FINGER RELEASE Right 04/30/2018   Procedure: RELEASE TRIGGER FINGER/A-1 PULLEY RIGHT MIDDLE  AND SMALLn ;  Surgeon: Murrell Kuba, MD;  Location: [facility];  Service: Orthopedics;  Laterality: Right;  FAB, Bier block   TUBAL LIGATION      Allergies: Codeine, Morphine and codeine, Donepezil , Lactose intolerance (gi), Mirabegron, Morphine sulfate, and Rivaroxaban   Medications: Prior to Admission medications   Medication Sig Start Date End Date Taking? Authorizing Provider  acetaminophen  (TYLENOL ) 500 MG tablet Take 500 mg by mouth daily.    [provider]  amLODipine  (NORVASC ) 2.5 MG tablet Take 1 tablet (2.5 mg total) by mouth daily. 04/08/24   Rai, Nydia POUR, MD  metFORMIN  (GLUCOPHAGE ) 500 MG tablet Take 500 mg by mouth daily with breakfast.    [provider]  methocarbamol  (ROBAXIN ) 500 MG tablet Take 1 tablet (500 mg total) by mouth every 8 (eight) hours as needed for muscle spasms. 05/14/24   Persons, Ronal Dragon, PA  metoprolol  succinate (TOPROL -XL) 50 MG 24 hr tablet Take 50 mg by mouth in the morning.    [provider]  ondansetron  (ZOFRAN ) 4 MG tablet Take 1 tablet (4 mg total) by mouth every 8 (eight) hours as needed for nausea or vomiting. 04/08/24   Rai, Nydia POUR, MD  pantoprazole  (PROTONIX ) 40 MG tablet Take 1 tablet (40 mg total) by mouth daily before breakfast. 04/08/24 04/08/25  Rai, Nydia POUR, MD  rosuvastatin  (CRESTOR ) 40 MG tablet Take 1 tablet (40 mg total) by mouth daily. 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  Tobacco Use   Smoking status: Former    Current packs/day: 0.00    Types: Cigarettes    Quit date: 11/13/1980    Years since quitting: 43.9   Smokeless tobacco: Never   Tobacco comments:    quit in 1982  Vaping Use   Vaping status: Never Used  Substance and Sexual Activity   Alcohol  use: No    Alcohol /week: 0.0 standard drinks of alcohol    Drug use: No   Sexual activity: Yes  Other Topics Concern   Not on file  Social History Narrative   21 st January 2014 , patient underwent PS and MSLT - MSLT had one  SREMs and an average time to fall asleep of  *.8 minutes , her Ewort is 20 points,: facit:  this patient has severe hypersomnia and is at risk when driving. medication in form ogf nuvigil smaples had been dispensed to her but she ha not yet taken it. 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 In detail -discussion of diagnosis and treatment  takes place today , first with nuvigil and if insufficient, with XYREM. Her CPAP treats her OSA very well, and she uses it 6 hours  or more each night,  residual AHI of 1.1  would not allow for OSA to be still explaining this degree of sleepiness. 2 downloads were reviewed. labs reviewed.    Patient is married Beverlie) and lives at home with her husband and grandchild.   Patient has four children   Patient is retired.   Patient has a college education.   Patient is right-handed.   Patient does not drink any caffeine.      Social Drivers of Corporate Investment Banker Strain: Not on file  Food Insecurity: No Food Insecurity (04/06/2024)   Hunger Vital Sign    Worried About Running Out of Food in the Last Year: Never true    Ran Out of Food in the Last Year: Never true  Transportation Needs: No Transportation Needs (04/06/2024)   PRAPARE - Scientist, Research (physical Sciences) (Medical): No    Lack of Transportation (Non-Medical): No  Physical Activity: Not on file  Stress: Not on file  Social Connections: Socially Integrated (04/06/2024)   Social Connection and Isolation Panel    Frequency of Communication with Friends and Family: More than three times a week    Frequency of Social Gatherings with Friends and Family: Twice a week    Attends Religious Services: More than 4 times per year    Active Member of [employer] or Organizations: Yes    Attends Banker Meetings: 1 to 4 times per year    Marital Status: Married    ECOG Status: 1 - Symptomatic but completely ambulatory   Review of Systems  Constitutional: Negative.   Respiratory: Negative.    Cardiovascular: Negative.   Gastrointestinal:  Positive for blood in stool. Negative for abdominal pain.  Genitourinary: Negative.   Neurological: Negative.     Vital Signs: BP (!) 160/75   Pulse 73   Temp 97.6 F (36.4 C) (Oral)   Resp 17   SpO2 100%     Physical Exam Constitutional:      Appearance: She is not ill-appearing.  Cardiovascular:     Rate and Rhythm: Tachycardia present.  Pulmonary:     Effort: Pulmonary effort is normal.     Breath sounds: Normal breath sounds.  Abdominal:     General: Abdomen is flat. There is no distension.     Palpations: Abdomen is soft.  Neurological:     Mental Status: She is alert.     Imaging: CT ANGIO GI BLEED Result Date: 10/05/2024 CLINICAL DATA:  Lower GI bleed. Pseudoaneurysm in the terminal ileum. EXAM: CTA ABDOMEN AND PELVIS WITHOUT AND WITH CONTRAST TECHNIQUE: Multidetector CT imaging of the abdomen and pelvis was performed using the standard protocol during bolus administration of intravenous contrast. Multiplanar reconstructed images and MIPs were obtained and reviewed to evaluate the vascular anatomy. RADIATION DOSE REDUCTION: This exam was performed according to the departmental dose-optimization program which includes  automated exposure  control, adjustment of the mA and/or kV according to patient size and/or use of iterative reconstruction technique. CONTRAST:  OMNIPAQUE  IOHEXOL  350 MG/ML SOLN COMPARISON:  CT abdomen pelvis dated 04/06/2024. FINDINGS: VASCULAR Aorta: Mild atherosclerotic calcification of the abdominal aorta. No dilatation or dissection. No periaortic fluid collection. Celiac: Patent without evidence of aneurysm, dissection, vasculitis or significant stenosis. SMA: Patent without evidence of aneurysm, dissection, vasculitis or significant stenosis. Renals: Both renal arteries are patent without evidence of aneurysm, dissection, vasculitis, fibromuscular dysplasia or significant stenosis. IMA: Patent without evidence of aneurysm, dissection, vasculitis or significant stenosis. Inflow: Mild atherosclerotic calcification of the iliac arteries. No aneurysmal dilatation or dissection. The iliac arteries are patent. Proximal Outflow: The visualized proximal fluid patent. Veins: The IVC is unremarkable. The SMV, splenic vein, and main portal vein are patent. No portal venous gas. Review of the MIP images confirms the above findings. NON-VASCULAR Lower chest: The visualized lung bases are clear. No intra-abdominal free air or free fluid. Hepatobiliary: No focal liver abnormality is seen. No gallstones, gallbladder wall thickening, or biliary dilatation. Pancreas: Unremarkable. No pancreatic ductal dilatation or surrounding inflammatory changes. Spleen: Normal in size without focal abnormality. Adrenals/Urinary Tract: The adrenal glands unremarkable. Bilateral renal parenchyma atrophy and cortical scarring. There is an 8 mm by stone in the upper pole of the left kidney. A 2 mm stone in the interpolar right kidney. No hydronephrosis on either side. Small bilateral renal cysts. The visualized ureters and urinary bladder appear unremarkable. Stomach/Bowel: Postsurgical changes of the rectosigmoid with anastomotic staple  line. There is colonic diverticulosis. There is no bowel obstruction or active inflammation. Progressive contrast pooling in the terminal ileum on postcontrast images consistent with active GI bleed. The appendix is normal. Lymphatic: No adenopathy. Reproductive: Hysterectomy.  No suspicious adnexal masses. Other: None Musculoskeletal: Degenerative changes of the spine and osteopenia. No acute osseous pathology. IMPRESSION: 1. Active GI bleed in the terminal ileum. 2. Colonic diverticulosis. No bowel obstruction. Normal appendix. 3. Bilateral nonobstructing renal calculi. No hydronephrosis. 4.  Aortic Atherosclerosis (ICD10-I70.0). Electronically Signed   By: Vanetta Chou M.D.   On: 10/05/2024 16:45   XR Toe 5th Right Result Date: 09/23/2024 X-rays of her right fifth toe do not demonstrate any acute fractures or dislocation   Labs:  CBC: Recent Labs    04/07/24 0038 04/07/24 1125 04/08/24 0318 10/05/24 1129 10/05/24 1635  WBC 7.3 6.7 8.7 3.5*  --   HGB 11.9* 11.3* 11.7* 11.4* 8.7*  HCT 35.9* 35.6* 35.0* 35.6* 27.2*  PLT 123* 104* 115* 264  --     COAGS: Recent Labs    04/06/24 1546  INR 1.2    BMP: Recent Labs    04/06/24 0528 04/07/24 0038 04/08/24 0318 10/05/24 1129  NA 144 140 140 141  K 4.4 3.5 3.6 4.8  CL 116* 116* 114* 107  CO2 18* 19* 17* 23  GLUCOSE 83 72 147* 65*  BUN 22 15 14 23   CALCIUM  8.9 7.9* 9.0 9.8  CREATININE 1.18* 1.12* 1.19* 1.57*  GFRNONAA 46* 48* 45* 32*    LIVER FUNCTION TESTS: Recent Labs    04/05/24 1745 10/05/24 1129  BILITOT 1.0 0.3  AST 38 34  ALT 23 16  ALKPHOS 51 80  PROT 7.6 7.4  ALBUMIN 4.0 4.1    TUMOR MARKERS: No results for input(s): AFPTM, CEA, CA199, CHROMGRNA in the last 8760 hours.  Assessment and Plan:  83 yo with recurrent lower GI bleeding.  History of GI bleed from terminal  ileum and status post coil embolization.  Today's CTA demonstrates active bleeding at terminal ileum at same location as exam  from May. Etiology for the bleeding is uncertain may be originating from a diverticulum.  Hemoglobin has dropped 11.4 to 8.7.  Patient needs a visceral arteriography and attempted embolization of active bleeding.   Risks and benefits of visceral arteriography and embolization were discussed with the patient including, but not limited to bleeding, infection, vascular injury, bowel ischemia or contrast induced renal failure.  This interventional procedure involves the use of X-rays and because of the nature of the planned procedure, it is possible that we will have prolonged use of X-ray fluoroscopy.  Potential radiation risks to you include (but are not limited to) the following: - A slightly elevated risk for cancer  several years later in life. This risk is typically less than 0.5% percent. This risk is low in comparison to the normal incidence of human cancer, which is 33% for women and 50% for men according to the American Cancer Society. - Radiation induced injury can include skin redness, resembling a rash, tissue breakdown / ulcers and hair loss (which can be temporary or permanent).   The likelihood of either of these occurring depends on the difficulty of the procedure and whether you are sensitive to radiation due to previous procedures, disease, or genetic conditions.   IF your procedure requires a prolonged use of radiation, you will be notified and given written instructions for further action.  It is your responsibility to monitor the irradiated area for the 2 weeks following the procedure and to notify your physician if you are concerned that you have suffered a radiation induced injury.    All of the patient's questions were answered, patient is agreeable to proceed.  Consent signed and in chart.   Electronically Signed: Juliene JONELLE Balder, MD 10/05/2024, 5:49 PM   I spent a total of 20 Minutes    in face to face in clinical consultation, greater than 50% of which was  counseling/coordinating care for GI bleed.

## 2024-10-05 NOTE — ED Notes (Signed)
 CT unable to use current IV- several unsuccessful attempts at new IV site

## 2024-10-05 NOTE — ED Triage Notes (Signed)
 Pt arrives via POV. Pt reports dark tarry stools since last night. Denies any other associated symptoms. Pt is AxOx4.

## 2024-10-06 ENCOUNTER — Inpatient Hospital Stay (HOSPITAL_COMMUNITY)

## 2024-10-06 DIAGNOSIS — K922 Gastrointestinal hemorrhage, unspecified: Secondary | ICD-10-CM | POA: Diagnosis not present

## 2024-10-06 LAB — GLUCOSE, CAPILLARY
Glucose-Capillary: 105 mg/dL — ABNORMAL HIGH (ref 70–99)
Glucose-Capillary: 105 mg/dL — ABNORMAL HIGH (ref 70–99)
Glucose-Capillary: 109 mg/dL — ABNORMAL HIGH (ref 70–99)
Glucose-Capillary: 110 mg/dL — ABNORMAL HIGH (ref 70–99)
Glucose-Capillary: 141 mg/dL — ABNORMAL HIGH (ref 70–99)
Glucose-Capillary: 70 mg/dL (ref 70–99)
Glucose-Capillary: 94 mg/dL (ref 70–99)
Glucose-Capillary: 95 mg/dL (ref 70–99)

## 2024-10-06 LAB — BASIC METABOLIC PANEL WITH GFR
Anion gap: 15 (ref 5–15)
BUN: 22 mg/dL (ref 8–23)
CO2: 19 mmol/L — ABNORMAL LOW (ref 22–32)
Calcium: 9 mg/dL (ref 8.9–10.3)
Chloride: 108 mmol/L (ref 98–111)
Creatinine, Ser: 1.25 mg/dL — ABNORMAL HIGH (ref 0.44–1.00)
GFR, Estimated: 42 mL/min — ABNORMAL LOW (ref >60)
Glucose, Bld: 189 mg/dL — ABNORMAL HIGH (ref 70–99)
Potassium: 4 mmol/L (ref 3.5–5.1)
Sodium: 142 mmol/L (ref 135–145)

## 2024-10-06 LAB — CBC
HCT: 32.4 % — ABNORMAL LOW (ref 36.0–46.0)
Hemoglobin: 10.3 g/dL — ABNORMAL LOW (ref 12.0–15.0)
MCH: 29.9 pg (ref 26.0–34.0)
MCHC: 31.8 g/dL (ref 30.0–36.0)
MCV: 93.9 fL (ref 80.0–100.0)
Platelets: 274 K/uL (ref 150–400)
RBC: 3.45 MIL/uL — ABNORMAL LOW (ref 3.87–5.11)
RDW: 13.4 % (ref 11.5–15.5)
WBC: 8.1 K/uL (ref 4.0–10.5)
nRBC: 0 % (ref 0.0–0.2)

## 2024-10-06 LAB — MAGNESIUM: Magnesium: 1.7 mg/dL (ref 1.7–2.4)

## 2024-10-06 LAB — TROPONIN T, HIGH SENSITIVITY: Troponin T High Sensitivity: <15 ng/L (ref 0–19)

## 2024-10-06 LAB — MRSA NEXT GEN BY PCR, NASAL: MRSA by PCR Next Gen: NOT DETECTED

## 2024-10-06 LAB — HEMOGLOBIN AND HEMATOCRIT, BLOOD
HCT: 33.5 % — ABNORMAL LOW (ref 36.0–46.0)
HCT: 29.1 % — ABNORMAL LOW (ref 36.0–46.0)
Hemoglobin: 10.7 g/dL — ABNORMAL LOW (ref 12.0–15.0)
Hemoglobin: 9.4 g/dL — ABNORMAL LOW (ref 12.0–15.0)

## 2024-10-06 MED ORDER — IOHEXOL 300 MG/ML  SOLN
75.0000 mL | Freq: Once | INTRAMUSCULAR | Status: AC | PRN
Start: 2024-10-06 — End: 2024-10-06
  Administered 2024-10-06: 75 mL via INTRAVENOUS

## 2024-10-06 MED ORDER — NOREPINEPHRINE 4 MG/250ML-% IV SOLN
INTRAVENOUS | Status: AC
  Filled 2024-10-06: qty 250

## 2024-10-06 MED ORDER — INSULIN ASPART 100 UNIT/ML IJ SOLN
0.0000 [IU] | INTRAMUSCULAR | Status: DC
  Administered 2024-10-07 – 2024-10-09 (×3): 1 [IU] via SUBCUTANEOUS
  Administered 2024-10-11: 4 [IU] via SUBCUTANEOUS
  Administered 2024-10-11: 3 [IU] via SUBCUTANEOUS
  Filled 2024-10-06: qty 3
  Filled 2024-10-06 (×2): qty 1
  Filled 2024-10-06: qty 4

## 2024-10-06 MED ORDER — SODIUM CHLORIDE 0.9 % IV SOLN
250.0000 mL | INTRAVENOUS | Status: DC
  Administered 2024-10-06: 250 mL via INTRAVENOUS

## 2024-10-06 MED ORDER — CHLORHEXIDINE GLUCONATE CLOTH 2 % EX PADS
6.0000 | MEDICATED_PAD | Freq: Every day | CUTANEOUS | Status: DC
  Administered 2024-10-06 – 2024-10-11 (×4): 6 via TOPICAL

## 2024-10-06 MED ORDER — SODIUM CHLORIDE 0.9 % IV BOLUS
1000.0000 mL | Freq: Once | INTRAVENOUS | Status: AC
  Administered 2024-10-06: 1000 mL via INTRAVENOUS

## 2024-10-06 MED ORDER — NOREPINEPHRINE 4 MG/250ML-% IV SOLN: 0.0000 ug/min | INTRAVENOUS | Status: DC

## 2024-10-06 MED ORDER — MAGNESIUM SULFATE 2 GM/50ML IV SOLN
2.0000 g | Freq: Once | INTRAVENOUS | Status: AC
  Administered 2024-10-06: 2 g via INTRAVENOUS
  Filled 2024-10-06: qty 50

## 2024-10-06 MED ORDER — DEXTROSE-SODIUM CHLORIDE 5-0.9 % IV SOLN: INTRAVENOUS | Status: DC

## 2024-10-06 NOTE — Progress Notes (Signed)
 Cross covering ICU Physician  Called to assess  pt after sudden onset CP and feeling like she needed to have a bm, resulting in a precipitous drop in BP 74/23 and HR 43. Upon chart review it does appear that pt has had similar transient periods of similar vital signs. TRH ordered fluids and with pt's BP improved to >110. ? Vagal like episode that resulted in bradycardia/hypotensive. Elytes checked and mag 1.7 was given 2gm, K is 4, Ca 9.  Emergent labs drawn and cta abd/pelvis ordered.Yesterday pt underwent a R groin accessed IR procedure for visceral arteriography for GI bleed, recurrent.   Hgb 10.4->10.3 Plts 274 BUN 22  Ccm was asked to assess:   HR: 101 BP: 162/62  RR: 22 SpO2:  95 on RA General:  nad, resting comfortably in bed  HENT: NCAT, PERRL, mmmp PULM: CTA B, CV: RRR, no mgr GI: BS+, soft, nontender, nondistended MSK: normal bulk and tone Neuro: no focal deficits  Pt vitals have returned to baseline. Labs stable. Cta negative for acute bleed or change.

## 2024-10-06 NOTE — Progress Notes (Signed)
 Patient ID: Stephanie Barber, female   DOB: 14-May-1941, 83 y.o.   MRN: [id number] Status post negative visceral angiogram yesterday for acute GI bleed.  Currently without new complaints or visible bleeding.  Hemoglobin stable at 10.3.  Follow-up CT angiogram today revealed no acute findings.  Puncture site right common femoral artery soft, clean, dry, nontender, no hematoma, intact distal pulses. Further plans per primary team/CCM/GI.

## 2024-10-06 NOTE — Plan of Care (Signed)
  Problem: Education: Goal: Ability to describe self-care measures that may prevent or decrease complications (Diabetes Survival Skills Education) will improve Outcome: Progressing   Problem: Coping: Goal: Ability to adjust to condition or change in health will improve Outcome: Progressing   Problem: Coping: Goal: Level of anxiety will decrease Outcome: Progressing   Problem: Pain Managment: Goal: General experience of comfort will improve and/or be controlled Outcome: Progressing   Problem: Skin Integrity: Goal: Risk for impaired skin integrity will decrease Outcome: Progressing

## 2024-10-06 NOTE — Progress Notes (Addendum)
 PROGRESS NOTE    Stephanie Barber  FMW:[id number] DOB: [birth year] DOA: 10/05/2024 PCP: Sun, Vyvyan, MD    Brief Narrative:   Stephanie Barber is a 83 y.o. 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  Onset night prior.  Started having abdominal cramping pain throughout the day with reported bright red blood per rectum.  Denies any antiplatelet, anticoagulant use.  Denies NSAID use.  In the ED, temperature 97.9 F, HR 62, RR 18, BP 124/84, SpO2 99% on 2 L nasal cannula.  WBC 3.5, hemoglobin 11.4, platelet count 264.  Sodium 141, potassium 4.8, chloride 107, CO2 23, glucose 65, BUN 23, creat 1.57.  AST 34, ALT 16, total bilirubin 0.3.  FOBT positive.  Interventional radiology consulted for angiogram.  GI consulted.  TRH consulted for admission for further evaluation management of lower GI bleed.  Assessment & Plan:   Lower GI bleed, terminal ileum Hx colonic pseudoaneurysm s/p embolization Patient presenting with bright red blood per rectum.  Denies NSAID use, no anticoagulant/antiplatelet use.  History of lower GI bleed related to colonic surrounds him and underwent embolization in the past.  Hemoglobin 11.4; with repeat dropping to 8.7.  CT angiogram GI bleed study initially positive for active bleeding terminal ileum.  Interventional radiology was consulted and patient underwent IR visceral angiogram with no clear evidence for active GI bleeding from the superior mesenteric artery and ileocolic branches, and no embolization was performed.  Shortly following admission, patient's heart rate and blood pressure dropped transiently with repeat hemoglobin thereafter 10.4.  Repeat CT angiogram abdomen/pelvis was then performed with no acute findings to explain lower GI bleeding, noted postsurgical changes in the sigmoid and prior  embolotherapy changes in the right lower quadrant.  -- [facility] following, appreciate assistance -- Hgb 11.4>8.7>10.4>10.3>10.7 -- N.p.o., sips of water , ice chips okay -- H&H every 6 hours -- Monitor on telemetry -- CBC in a.m.  Bradycardia/hypotension, transient; suspected due to vasovagal event: Resolved Overnight, patient's heart rate and blood pressure decreased transiently.  Repeat hemoglobin stable.  Underwent CT angiogram abdomen/pelvis with no significant findings.  Seen by pulmonary critical care medicine.  Received IV fluid bolus with resolution of hypotension and bradycardia. -- Continue to monitor on telemetry  DM2 Hemoglobin C 6.4%.  At baseline on metformin  500 mg p.o. daily. -- Hold oral hypoglycemics while inpatient -- Very sensitive SSI for coverage -- CBGs every 4 hours, currently n.p.o.  HTN On amlodipine  2.5 g p.o. daily, metoprolol  succinate 50 g p.o. daily at baseline.  Episode of bradycardia and hypotension earlier this morning. -- Will hold home antihypertensives for now -- Monitor BP closely  HLD -- Holding home atorvastatin for now  CKD stage IIIb Baseline creatinine 1.2-1.6, stable -- Cr 1.57>1.25 -- BMP in am   DVT prophylaxis: SCDs Start: 10/05/24 1754    Code Status: Full Code Family Communication: Spouse present at bedside this morning  Disposition Plan:  Level of care: ICU Status is: Inpatient Remains inpatient appropriate because: Awaiting GI evaluation, monitoring of serial hemoglobin    Consultants:  [facility]  Procedures:  None  Antimicrobials:  None   Subjective: Patient seen examined bedside, lying in bed.  Spouse present at bedside.  Patient reports small amount of blood in stool this morning.  No specific complaints at this time.  Earlier this morning had bradycardia and  hypotension event likely from a vasovagal event.  Remains recovered with only IV fluid as intervention.  Hemoglobin stable, CT angiogram  abdomen/pelvis with no source of active bleeding.  No complaints, concerns this morning.  Denies headache, no dizziness, no chest pain, no palpitations, no shortness of breath, no abdominal pain, no fever/chills/night sweats, no nausea/vomiting/diarrhea, no focal weakness, no fatigue, no paresthesia.  No other acute events overnight per nursing.  Discussed with RN this morning.  Objective: Vitals:   10/06/24 0700 10/06/24 0750 10/06/24 0800 10/06/24 0900  BP: (!) 155/65  (!) 156/68 (!) 154/61  Pulse: 98 91 95 92  Resp: 15 (!) 26 20 12   Temp:  98.4 F (36.9 C)    TempSrc:  Axillary    SpO2: 96% 96% 97% 96%  Weight:      Height:        Intake/Output Summary (Last 24 hours) at 10/06/2024 1032 Last data filed at 10/06/2024 0700 Gross per 24 hour  Intake 1004.23 ml  Output 700 ml  Net 304.23 ml   Filed Weights   10/05/24 1920  Weight: 54.6 kg    Examination:  Physical Exam: GEN: NAD, alert and oriented x 3,  elderly in appearance HEENT: NCAT, PERRL, EOMI, sclera clear, MMM PULM: CTAB w/o wheezes/crackles, normal respiratory effort, no room air CV: RRR w/o M/G/R GI: abd soft, NTND, + BS MSK: no peripheral edema, moves all extremities independently NEURO: CN II-XII intact, no focal deficits, sensation to light touch intact PSYCH: normal mood/affect Integumentary: dry/intact, no rashes or wounds    Data Reviewed: I have personally reviewed following labs and imaging studies  CBC: Recent Labs  Lab 10/05/24 1129 10/05/24 1635 10/05/24 1927 10/06/24 0203 10/06/24 0954  WBC 3.5*  --   --  8.1  --   HGB 11.4* 8.7* 10.4* 10.3* 10.7*  HCT 35.6* 27.2* 34.5* 32.4* 33.5*  MCV 92.5  --   --  93.9  --   PLT 264  --   --  274  --    Basic Metabolic Panel: Recent Labs  Lab 10/05/24 1129 10/06/24 0203  NA 141 142  K 4.8 4.0  CL 107 108  CO2 23 19*  GLUCOSE 65* 189*  BUN 23 22  CREATININE 1.57* 1.25*  CALCIUM  9.8 9.0  MG  --  1.7   GFR: Estimated Creatinine  Clearance: 25.3 mL/min (A) (by C-G formula based on SCr of 1.25 mg/dL (H)). Liver Function Tests: Recent Labs  Lab 10/05/24 1129  AST 34  ALT 16  ALKPHOS 80  BILITOT 0.3  PROT 7.4  ALBUMIN 4.1   No results for input(s): LIPASE, AMYLASE in the last 168 hours. No results for input(s): AMMONIA in the last 168 hours. Coagulation Profile: No results for input(s): INR, PROTIME in the last 168 hours. Cardiac Enzymes: No results for input(s): CKTOTAL, CKMB, CKMBINDEX, TROPONINI in the last 168 hours. BNP (last 3 results) No results for input(s): PROBNP in the last 8760 hours. HbA1C: Recent Labs    10/05/24 1927  HGBA1C 6.4*   CBG: Recent Labs  Lab 10/05/24 2111 10/06/24 0043 10/06/24 0219 10/06/24 0424 10/06/24 0746  GLUCAP 118* 110* 141* 105* 105*   Lipid Profile: No results for input(s): CHOL, HDL, LDLCALC, TRIG, CHOLHDL, LDLDIRECT in the last 72 hours. Thyroid  Function Tests: No results for input(s): TSH, T4TOTAL, FREET4, T3FREE, THYROIDAB in the last 72 hours. Anemia Panel: No results for input(s): VITAMINB12, FOLATE, FERRITIN, TIBC, IRON , RETICCTPCT in the last 72 hours. Sepsis  Labs: No results for input(s): PROCALCITON, LATICACIDVEN in the last 168 hours.  No results found for this or any previous visit (from the past 240 hours).       Radiology Studies: CT Angio Abd/Pel w/ and/or w/o Result Date: 10/06/2024 EXAM: CTA ABDOMEN AND PELVIS WITHOUT AND WITH CONTRAST 10/06/2024 03:42:28 AM TECHNIQUE: CTA images of the abdomen and pelvis without and with intravenous contrast. 75 mL iohexol  (OMNIPAQUE ) 300 MG/ML solution was administered. Three-dimensional MIP/volume rendered formations were performed. Automated exposure control, iterative reconstruction, and/or weight based adjustment of the mA/kV was utilized to reduce the radiation dose to as low as reasonably achievable. COMPARISON: 10/05/2024 CLINICAL HISTORY:  Lower GI bleed. FINDINGS: VASCULATURE: AORTA: Prominent chronic calcifications of the aorta are noted without aneurysmal dilatation. No dissection. CELIAC TRUNK: No acute finding. No occlusion or significant stenosis. SUPERIOR MESENTERIC ARTERY: No acute finding. No occlusion or significant stenosis. RENAL ARTERIES: No acute finding. No occlusion or significant stenosis. ILIAC ARTERIES: No acute finding. No occlusion or significant stenosis. LIVER: The liver is within normal limits. GALLBLADDER AND BILE DUCTS: Gallbladder is within normal limits. No biliary ductal dilatation. SPLEEN: The spleen is unremarkable. PANCREAS: The pancreas is unremarkable. ADRENAL GLANDS: Bilateral adrenal glands show multiple renal cysts. No follow-up is recommended. KIDNEYS, URETERS AND BLADDER: Kidneys show multiple renal cysts. No follow-up is recommended. No stones in the kidneys or ureters. The bladder is well distended with opacified urine and is otherwise unremarkable. GI AND BOWEL: Stomach and duodenal sweep demonstrate no acute abnormality. Small bowel is within normal limits. The area of hemorrhage in the terminal ileum is no longer identified. No obstructive or inflammatory changes of the colon are noted. Postsurgical changes in the sigmoid are seen. The appendix is within normal limits. Scattered diverticular changes are noted without inflammatory changes. No areas of pooling of contrast are noted to suggest extravasation. Some retained hyperdense material is noted within the colon related to prior contrast extravasation. There is no bowel obstruction. No abnormal bowel wall thickening or distension. REPRODUCTIVE: The uterus has been surgically removed. PERITONEUM AND RETRPERITONEUM: No ascites or free air. No free fluid is noted. LUNG BASE: Bases are free of acute infiltrate. Mild scarring is noted bilaterally. LYMPH NODES: No lymphadenopathy. BONES AND SOFT TISSUES: Osseous structures show degenerative changes of the lumbar  spine. Changes of embolotherapy in the right lower quadrant is again seen. IMPRESSION: 1. No acute findings to explain lower GI bleeding. 2. Postsurgical changes in the sigmoid and prior embolotherapy changes in the right lower quadrant. 3. Colonic diverticulosis without evidence of diverticulitis. Electronically signed by: Oneil Devonshire MD 10/06/2024 04:04 AM EST RP Workstation: [id number]   IR US  Guide Vasc Access Right Result Date: 10/05/2024 INDICATION: 83 year old with acute GI bleed. CTA demonstrates bleeding at the terminal ileum. Patient had a similar area of bleeding in May 2025 and previous catheter directed coil embolization. EXAM: 1. Visceral angiography: Superior mesenteric artery 2. Visceral angiography: 2 second order branches of the superior mesenteric artery; ileocecal artery and small bowel branch 3. Visceral angiography: 2 third order branches of the superior mesenteric artery; superior ileocolic branch and inferior ileocolic branch 4. Ultrasound guidance for vascular access MEDICATIONS: Moderate sedation ANESTHESIA/SEDATION: Moderate (conscious) sedation was employed during this procedure. A total of Versed  2 mg and Fentanyl  100 mcg was administered intravenously by the radiology nurse. Total intra-service moderate Sedation Time: 32 minutes. The patient's level of consciousness and vital signs were monitored continuously by radiology nursing throughout the procedure under my direct supervision.  CONTRAST:  60 mL Omnipaque  300 FLUOROSCOPY: Radiation Exposure Index (as provided by the fluoroscopic device): 494 mGy Kerma COMPLICATIONS: None immediate. PROCEDURE: Informed consent was obtained from the patient following explanation of the procedure, risks, benefits and alternatives. The patient understands, agrees and consents for the procedure. All questions were addressed. A time out was performed prior to the initiation of the procedure. Right groin was prepped and draped in sterile fashion.  Maximal barrier sterile technique was utilized including caps, mask, sterile gowns, sterile gloves, sterile drape, hand hygiene and skin antiseptic. Ultrasound confirmed a patent right common femoral artery. Ultrasound image was saved for documentation. Right groin was anesthetized with 1% lidocaine . A small incision was made. Using ultrasound guidance, 21 gauge needle was directed into the right common femoral artery and a micropuncture dilator set was placed. Five French vascular sheath was placed over a Bentson wire. C2 catheter was used to cannulate the superior mesenteric artery. Selective SMA arteriogram was performed. STC microcatheter was advanced into the second order ileocolic branch. Selective angiography was performed of this artery. Microcatheter was used to select the superior and inferior ileocolic artery branches. Selective angiography was performed in each artery. Catheter was pulled back into the main ileocolic branch and additional angiography was performed. Microcatheter was used to cannulate a second order branch of the SMA along the right side of the abdomen. This appeared to be a small bowel branch likely representing ileal branches. Microcatheter and C2 catheter were removed. Oblique angiography was performed through the right groin sheath. Right groin sheath was removed using Angio-Seal closure device. Right groin hemostasis at the end of the procedure. FINDINGS: Superior mesenteric artery is patent. The initial superior mesenteric arteriogram raised concern for a small focus of contrast extravasation in the expected region of the terminal ileum. Therefore, selective angiography was performed in the ileocolic artery and ileocolic artery branches. However, there was never re-demonstration of contrast extravasation, contrast staining, early venous filling or a vascular malformation. No evidence for contrast extravasation involving the right-sided small bowel arteries. IMPRESSION: 1. No clear  evidence for active GI bleeding from the superior mesenteric artery and ileocolic branches. Embolization was not performed. Electronically Signed   By: Juliene Balder M.D.   On: 10/05/2024 22:26   IR Angiogram Visceral Selective Result Date: 10/05/2024 INDICATION: 83 year old with acute GI bleed. CTA demonstrates bleeding at the terminal ileum. Patient had a similar area of bleeding in May 2025 and previous catheter directed coil embolization. EXAM: 1. Visceral angiography: Superior mesenteric artery 2. Visceral angiography: 2 second order branches of the superior mesenteric artery; ileocecal artery and small bowel branch 3. Visceral angiography: 2 third order branches of the superior mesenteric artery; superior ileocolic branch and inferior ileocolic branch 4. Ultrasound guidance for vascular access MEDICATIONS: Moderate sedation ANESTHESIA/SEDATION: Moderate (conscious) sedation was employed during this procedure. A total of Versed  2 mg and Fentanyl  100 mcg was administered intravenously by the radiology nurse. Total intra-service moderate Sedation Time: 32 minutes. The patient's level of consciousness and vital signs were monitored continuously by radiology nursing throughout the procedure under my direct supervision. CONTRAST:  60 mL Omnipaque  300 FLUOROSCOPY: Radiation Exposure Index (as provided by the fluoroscopic device): 494 mGy Kerma COMPLICATIONS: None immediate. PROCEDURE: Informed consent was obtained from the patient following explanation of the procedure, risks, benefits and alternatives. The patient understands, agrees and consents for the procedure. All questions were addressed. A time out was performed prior to the initiation of the procedure. Right groin was prepped and draped  in sterile fashion. Maximal barrier sterile technique was utilized including caps, mask, sterile gowns, sterile gloves, sterile drape, hand hygiene and skin antiseptic. Ultrasound confirmed a patent right common femoral  artery. Ultrasound image was saved for documentation. Right groin was anesthetized with 1% lidocaine . A small incision was made. Using ultrasound guidance, 21 gauge needle was directed into the right common femoral artery and a micropuncture dilator set was placed. Five French vascular sheath was placed over a Bentson wire. C2 catheter was used to cannulate the superior mesenteric artery. Selective SMA arteriogram was performed. STC microcatheter was advanced into the second order ileocolic branch. Selective angiography was performed of this artery. Microcatheter was used to select the superior and inferior ileocolic artery branches. Selective angiography was performed in each artery. Catheter was pulled back into the main ileocolic branch and additional angiography was performed. Microcatheter was used to cannulate a second order branch of the SMA along the right side of the abdomen. This appeared to be a small bowel branch likely representing ileal branches. Microcatheter and C2 catheter were removed. Oblique angiography was performed through the right groin sheath. Right groin sheath was removed using Angio-Seal closure device. Right groin hemostasis at the end of the procedure. FINDINGS: Superior mesenteric artery is patent. The initial superior mesenteric arteriogram raised concern for a small focus of contrast extravasation in the expected region of the terminal ileum. Therefore, selective angiography was performed in the ileocolic artery and ileocolic artery branches. However, there was never re-demonstration of contrast extravasation, contrast staining, early venous filling or a vascular malformation. No evidence for contrast extravasation involving the right-sided small bowel arteries. IMPRESSION: 1. No clear evidence for active GI bleeding from the superior mesenteric artery and ileocolic branches. Embolization was not performed. Electronically Signed   By: Juliene Balder M.D.   On: 10/05/2024 22:26   IR  Angiogram Selective Each Additional Vessel Result Date: 10/05/2024 INDICATION: 83 year old with acute GI bleed. CTA demonstrates bleeding at the terminal ileum. Patient had a similar area of bleeding in May 2025 and previous catheter directed coil embolization. EXAM: 1. Visceral angiography: Superior mesenteric artery 2. Visceral angiography: 2 second order branches of the superior mesenteric artery; ileocecal artery and small bowel branch 3. Visceral angiography: 2 third order branches of the superior mesenteric artery; superior ileocolic branch and inferior ileocolic branch 4. Ultrasound guidance for vascular access MEDICATIONS: Moderate sedation ANESTHESIA/SEDATION: Moderate (conscious) sedation was employed during this procedure. A total of Versed  2 mg and Fentanyl  100 mcg was administered intravenously by the radiology nurse. Total intra-service moderate Sedation Time: 32 minutes. The patient's level of consciousness and vital signs were monitored continuously by radiology nursing throughout the procedure under my direct supervision. CONTRAST:  60 mL Omnipaque  300 FLUOROSCOPY: Radiation Exposure Index (as provided by the fluoroscopic device): 494 mGy Kerma COMPLICATIONS: None immediate. PROCEDURE: Informed consent was obtained from the patient following explanation of the procedure, risks, benefits and alternatives. The patient understands, agrees and consents for the procedure. All questions were addressed. A time out was performed prior to the initiation of the procedure. Right groin was prepped and draped in sterile fashion. Maximal barrier sterile technique was utilized including caps, mask, sterile gowns, sterile gloves, sterile drape, hand hygiene and skin antiseptic. Ultrasound confirmed a patent right common femoral artery. Ultrasound image was saved for documentation. Right groin was anesthetized with 1% lidocaine . A small incision was made. Using ultrasound guidance, 21 gauge needle was directed  into the right common femoral artery and a micropuncture dilator  set was placed. Five French vascular sheath was placed over a Bentson wire. C2 catheter was used to cannulate the superior mesenteric artery. Selective SMA arteriogram was performed. STC microcatheter was advanced into the second order ileocolic branch. Selective angiography was performed of this artery. Microcatheter was used to select the superior and inferior ileocolic artery branches. Selective angiography was performed in each artery. Catheter was pulled back into the main ileocolic branch and additional angiography was performed. Microcatheter was used to cannulate a second order branch of the SMA along the right side of the abdomen. This appeared to be a small bowel branch likely representing ileal branches. Microcatheter and C2 catheter were removed. Oblique angiography was performed through the right groin sheath. Right groin sheath was removed using Angio-Seal closure device. Right groin hemostasis at the end of the procedure. FINDINGS: Superior mesenteric artery is patent. The initial superior mesenteric arteriogram raised concern for a small focus of contrast extravasation in the expected region of the terminal ileum. Therefore, selective angiography was performed in the ileocolic artery and ileocolic artery branches. However, there was never re-demonstration of contrast extravasation, contrast staining, early venous filling or a vascular malformation. No evidence for contrast extravasation involving the right-sided small bowel arteries. IMPRESSION: 1. No clear evidence for active GI bleeding from the superior mesenteric artery and ileocolic branches. Embolization was not performed. Electronically Signed   By: Juliene Balder M.D.   On: 10/05/2024 22:26   IR Angiogram Selective Each Additional Vessel Result Date: 10/05/2024 INDICATION: 83 year old with acute GI bleed. CTA demonstrates bleeding at the terminal ileum. Patient had a similar  area of bleeding in May 2025 and previous catheter directed coil embolization. EXAM: 1. Visceral angiography: Superior mesenteric artery 2. Visceral angiography: 2 second order branches of the superior mesenteric artery; ileocecal artery and small bowel branch 3. Visceral angiography: 2 third order branches of the superior mesenteric artery; superior ileocolic branch and inferior ileocolic branch 4. Ultrasound guidance for vascular access MEDICATIONS: Moderate sedation ANESTHESIA/SEDATION: Moderate (conscious) sedation was employed during this procedure. A total of Versed  2 mg and Fentanyl  100 mcg was administered intravenously by the radiology nurse. Total intra-service moderate Sedation Time: 32 minutes. The patient's level of consciousness and vital signs were monitored continuously by radiology nursing throughout the procedure under my direct supervision. CONTRAST:  60 mL Omnipaque  300 FLUOROSCOPY: Radiation Exposure Index (as provided by the fluoroscopic device): 494 mGy Kerma COMPLICATIONS: None immediate. PROCEDURE: Informed consent was obtained from the patient following explanation of the procedure, risks, benefits and alternatives. The patient understands, agrees and consents for the procedure. All questions were addressed. A time out was performed prior to the initiation of the procedure. Right groin was prepped and draped in sterile fashion. Maximal barrier sterile technique was utilized including caps, mask, sterile gowns, sterile gloves, sterile drape, hand hygiene and skin antiseptic. Ultrasound confirmed a patent right common femoral artery. Ultrasound image was saved for documentation. Right groin was anesthetized with 1% lidocaine . A small incision was made. Using ultrasound guidance, 21 gauge needle was directed into the right common femoral artery and a micropuncture dilator set was placed. Five French vascular sheath was placed over a Bentson wire. C2 catheter was used to cannulate the superior  mesenteric artery. Selective SMA arteriogram was performed. STC microcatheter was advanced into the second order ileocolic branch. Selective angiography was performed of this artery. Microcatheter was used to select the superior and inferior ileocolic artery branches. Selective angiography was performed in each artery. Catheter was pulled back  into the main ileocolic branch and additional angiography was performed. Microcatheter was used to cannulate a second order branch of the SMA along the right side of the abdomen. This appeared to be a small bowel branch likely representing ileal branches. Microcatheter and C2 catheter were removed. Oblique angiography was performed through the right groin sheath. Right groin sheath was removed using Angio-Seal closure device. Right groin hemostasis at the end of the procedure. FINDINGS: Superior mesenteric artery is patent. The initial superior mesenteric arteriogram raised concern for a small focus of contrast extravasation in the expected region of the terminal ileum. Therefore, selective angiography was performed in the ileocolic artery and ileocolic artery branches. However, there was never re-demonstration of contrast extravasation, contrast staining, early venous filling or a vascular malformation. No evidence for contrast extravasation involving the right-sided small bowel arteries. IMPRESSION: 1. No clear evidence for active GI bleeding from the superior mesenteric artery and ileocolic branches. Embolization was not performed. Electronically Signed   By: Juliene Balder M.D.   On: 10/05/2024 22:26   IR Angiogram Selective Each Additional Vessel Result Date: 10/05/2024 INDICATION: 83 year old with acute GI bleed. CTA demonstrates bleeding at the terminal ileum. Patient had a similar area of bleeding in May 2025 and previous catheter directed coil embolization. EXAM: 1. Visceral angiography: Superior mesenteric artery 2. Visceral angiography: 2 second order branches of  the superior mesenteric artery; ileocecal artery and small bowel branch 3. Visceral angiography: 2 third order branches of the superior mesenteric artery; superior ileocolic branch and inferior ileocolic branch 4. Ultrasound guidance for vascular access MEDICATIONS: Moderate sedation ANESTHESIA/SEDATION: Moderate (conscious) sedation was employed during this procedure. A total of Versed  2 mg and Fentanyl  100 mcg was administered intravenously by the radiology nurse. Total intra-service moderate Sedation Time: 32 minutes. The patient's level of consciousness and vital signs were monitored continuously by radiology nursing throughout the procedure under my direct supervision. CONTRAST:  60 mL Omnipaque  300 FLUOROSCOPY: Radiation Exposure Index (as provided by the fluoroscopic device): 494 mGy Kerma COMPLICATIONS: None immediate. PROCEDURE: Informed consent was obtained from the patient following explanation of the procedure, risks, benefits and alternatives. The patient understands, agrees and consents for the procedure. All questions were addressed. A time out was performed prior to the initiation of the procedure. Right groin was prepped and draped in sterile fashion. Maximal barrier sterile technique was utilized including caps, mask, sterile gowns, sterile gloves, sterile drape, hand hygiene and skin antiseptic. Ultrasound confirmed a patent right common femoral artery. Ultrasound image was saved for documentation. Right groin was anesthetized with 1% lidocaine . A small incision was made. Using ultrasound guidance, 21 gauge needle was directed into the right common femoral artery and a micropuncture dilator set was placed. Five French vascular sheath was placed over a Bentson wire. C2 catheter was used to cannulate the superior mesenteric artery. Selective SMA arteriogram was performed. STC microcatheter was advanced into the second order ileocolic branch. Selective angiography was performed of this artery.  Microcatheter was used to select the superior and inferior ileocolic artery branches. Selective angiography was performed in each artery. Catheter was pulled back into the main ileocolic branch and additional angiography was performed. Microcatheter was used to cannulate a second order branch of the SMA along the right side of the abdomen. This appeared to be a small bowel branch likely representing ileal branches. Microcatheter and C2 catheter were removed. Oblique angiography was performed through the right groin sheath. Right groin sheath was removed using Angio-Seal closure device. Right groin hemostasis  at the end of the procedure. FINDINGS: Superior mesenteric artery is patent. The initial superior mesenteric arteriogram raised concern for a small focus of contrast extravasation in the expected region of the terminal ileum. Therefore, selective angiography was performed in the ileocolic artery and ileocolic artery branches. However, there was never re-demonstration of contrast extravasation, contrast staining, early venous filling or a vascular malformation. No evidence for contrast extravasation involving the right-sided small bowel arteries. IMPRESSION: 1. No clear evidence for active GI bleeding from the superior mesenteric artery and ileocolic branches. Embolization was not performed. Electronically Signed   By: Juliene Balder M.D.   On: 10/05/2024 22:26   IR Angiogram Selective Each Additional Vessel Result Date: 10/05/2024 INDICATION: 83 year old with acute GI bleed. CTA demonstrates bleeding at the terminal ileum. Patient had a similar area of bleeding in May 2025 and previous catheter directed coil embolization. EXAM: 1. Visceral angiography: Superior mesenteric artery 2. Visceral angiography: 2 second order branches of the superior mesenteric artery; ileocecal artery and small bowel branch 3. Visceral angiography: 2 third order branches of the superior mesenteric artery; superior ileocolic branch and  inferior ileocolic branch 4. Ultrasound guidance for vascular access MEDICATIONS: Moderate sedation ANESTHESIA/SEDATION: Moderate (conscious) sedation was employed during this procedure. A total of Versed  2 mg and Fentanyl  100 mcg was administered intravenously by the radiology nurse. Total intra-service moderate Sedation Time: 32 minutes. The patient's level of consciousness and vital signs were monitored continuously by radiology nursing throughout the procedure under my direct supervision. CONTRAST:  60 mL Omnipaque  300 FLUOROSCOPY: Radiation Exposure Index (as provided by the fluoroscopic device): 494 mGy Kerma COMPLICATIONS: None immediate. PROCEDURE: Informed consent was obtained from the patient following explanation of the procedure, risks, benefits and alternatives. The patient understands, agrees and consents for the procedure. All questions were addressed. A time out was performed prior to the initiation of the procedure. Right groin was prepped and draped in sterile fashion. Maximal barrier sterile technique was utilized including caps, mask, sterile gowns, sterile gloves, sterile drape, hand hygiene and skin antiseptic. Ultrasound confirmed a patent right common femoral artery. Ultrasound image was saved for documentation. Right groin was anesthetized with 1% lidocaine . A small incision was made. Using ultrasound guidance, 21 gauge needle was directed into the right common femoral artery and a micropuncture dilator set was placed. Five French vascular sheath was placed over a Bentson wire. C2 catheter was used to cannulate the superior mesenteric artery. Selective SMA arteriogram was performed. STC microcatheter was advanced into the second order ileocolic branch. Selective angiography was performed of this artery. Microcatheter was used to select the superior and inferior ileocolic artery branches. Selective angiography was performed in each artery. Catheter was pulled back into the main ileocolic  branch and additional angiography was performed. Microcatheter was used to cannulate a second order branch of the SMA along the right side of the abdomen. This appeared to be a small bowel branch likely representing ileal branches. Microcatheter and C2 catheter were removed. Oblique angiography was performed through the right groin sheath. Right groin sheath was removed using Angio-Seal closure device. Right groin hemostasis at the end of the procedure. FINDINGS: Superior mesenteric artery is patent. The initial superior mesenteric arteriogram raised concern for a small focus of contrast extravasation in the expected region of the terminal ileum. Therefore, selective angiography was performed in the ileocolic artery and ileocolic artery branches. However, there was never re-demonstration of contrast extravasation, contrast staining, early venous filling or a vascular malformation. No evidence for contrast  extravasation involving the right-sided small bowel arteries. IMPRESSION: 1. No clear evidence for active GI bleeding from the superior mesenteric artery and ileocolic branches. Embolization was not performed. Electronically Signed   By: Juliene Balder M.D.   On: 10/05/2024 22:26   CT ANGIO GI BLEED Result Date: 10/05/2024 CLINICAL DATA:  Lower GI bleed. Pseudoaneurysm in the terminal ileum. EXAM: CTA ABDOMEN AND PELVIS WITHOUT AND WITH CONTRAST TECHNIQUE: Multidetector CT imaging of the abdomen and pelvis was performed using the standard protocol during bolus administration of intravenous contrast. Multiplanar reconstructed images and MIPs were obtained and reviewed to evaluate the vascular anatomy. RADIATION DOSE REDUCTION: This exam was performed according to the departmental dose-optimization program which includes automated exposure control, adjustment of the mA and/or kV according to patient size and/or use of iterative reconstruction technique. CONTRAST:  OMNIPAQUE  IOHEXOL  350 MG/ML SOLN COMPARISON:   CT abdomen pelvis dated 04/06/2024. FINDINGS: VASCULAR Aorta: Mild atherosclerotic calcification of the abdominal aorta. No dilatation or dissection. No periaortic fluid collection. Celiac: Patent without evidence of aneurysm, dissection, vasculitis or significant stenosis. SMA: Patent without evidence of aneurysm, dissection, vasculitis or significant stenosis. Renals: Both renal arteries are patent without evidence of aneurysm, dissection, vasculitis, fibromuscular dysplasia or significant stenosis. IMA: Patent without evidence of aneurysm, dissection, vasculitis or significant stenosis. Inflow: Mild atherosclerotic calcification of the iliac arteries. No aneurysmal dilatation or dissection. The iliac arteries are patent. Proximal Outflow: The visualized proximal fluid patent. Veins: The IVC is unremarkable. The SMV, splenic vein, and main portal vein are patent. No portal venous gas. Review of the MIP images confirms the above findings. NON-VASCULAR Lower chest: The visualized lung bases are clear. No intra-abdominal free air or free fluid. Hepatobiliary: No focal liver abnormality is seen. No gallstones, gallbladder wall thickening, or biliary dilatation. Pancreas: Unremarkable. No pancreatic ductal dilatation or surrounding inflammatory changes. Spleen: Normal in size without focal abnormality. Adrenals/Urinary Tract: The adrenal glands unremarkable. Bilateral renal parenchyma atrophy and cortical scarring. There is an 8 mm by stone in the upper pole of the left kidney. A 2 mm stone in the interpolar right kidney. No hydronephrosis on either side. Small bilateral renal cysts. The visualized ureters and urinary bladder appear unremarkable. Stomach/Bowel: Postsurgical changes of the rectosigmoid with anastomotic staple line. There is colonic diverticulosis. There is no bowel obstruction or active inflammation. Progressive contrast pooling in the terminal ileum on postcontrast images consistent with active GI  bleed. The appendix is normal. Lymphatic: No adenopathy. Reproductive: Hysterectomy.  No suspicious adnexal masses. Other: None Musculoskeletal: Degenerative changes of the spine and osteopenia. No acute osseous pathology. IMPRESSION: 1. Active GI bleed in the terminal ileum. 2. Colonic diverticulosis. No bowel obstruction. Normal appendix. 3. Bilateral nonobstructing renal calculi. No hydronephrosis. 4.  Aortic Atherosclerosis (ICD10-I70.0). Electronically Signed   By: Vanetta Chou M.D.   On: 10/05/2024 16:45        Scheduled Meds:  sodium chloride    Intravenous Once   amLODipine   2.5 mg Oral Daily   Chlorhexidine  Gluconate Cloth  6 each Topical Daily   insulin  aspart  0-6 Units Subcutaneous Q4H   metoprolol  succinate  50 mg Oral Daily   Continuous Infusions:  sodium chloride  40 mL/hr at 10/06/24 0448     LOS: 1 day    Time spent: 52 minutes spent on 10/06/2024 caring for this patient face-to-face including chart review, ordering labs/tests, documenting, discussion with nursing staff, consultants, updating family and interview/physical exam    Stephanie PARAS Kiyra Slaubaugh, DO [facility] Available  via Epic secure chat 7am-7pm After these hours, please refer to coverage provider listed on [email or web address] 10/06/2024, 10:32 AM

## 2024-10-06 NOTE — Plan of Care (Signed)

## 2024-10-06 NOTE — Inpatient Diabetes Management (Signed)
 Inpatient Diabetes Program Recommendations  AACE/ADA: New Consensus Statement on Inpatient Glycemic Control (2015)  Target Ranges:  Prepandial:   less than 140 mg/dL      Peak postprandial:   less than 180 mg/dL (1-2 hours)      Critically ill patients:  140 - 180 mg/dL   Lab Results  Component Value Date   GLUCAP 105 (H) 10/06/2024   HGBA1C 6.4 (H) 10/05/2024    Review of Glycemic Control  Diabetes history: DM2 Outpatient Diabetes medications: Metformin  500 mg daily Current orders for Inpatient glycemic control: Novolog  0-15 units q 4 hrs.  Inpatient Diabetes Program Recommendations:   Please consider: -Decrease Novolog  correction to 0-6 units q 4 hrs.  Thank you, Stephanie Barber E. Stephanie Abril, RN, MSN, CNS, CDCES  Diabetes Coordinator Inpatient Glycemic Control Team Team Pager [phone] (8am-5pm) 10/06/2024 7:45 AM

## 2024-10-06 NOTE — Progress Notes (Addendum)
 ICU Consult    NAME:  Stephanie Barber, MRN:  [id number], DOB:  Apr 04, 1941, LOS: 1 ADMISSION DATE:  10/05/2024, CONSULTATION DATE:  10/06/24 REFERRING : Lynwood Kipper ACNP CHIEF COMPLAINT:  Hyportensive and bradycardic w rhythm change   Brief History   83 year old female history significant of non-insulin -dependent diabetes, hypertension, hyperlipidemia and hospitalization in May 2025 for bleeding colonic pseudoaneurysm. Admitted through ER with recurrent lower GI bleed and acute blood loss anemia.   History of present illness   Notified by RN for rapid drop of blood pressure, heart rate to 30s. Fluid bolus ordered with response. Pressor ordered but not needed  Past Medical History  Arthritis Chronic headache Allergic to Novocain Diabetes mellitus GERD GI bleed Heart murmur Hyperlipidemia High cholesterol Hypertension Intracranial arthrosclerosis Migraine Narcolepsy and cataplexy OSA-not using CPAP over a year PVD Rectal prolapse Stage IIIb chronic kidney disease Trigger finger of all digits of right hand   Significant Hospital Events   Visceral arteriography by IR  Consults:  IR-previously CCM-currently  Procedures:  Visceral arteriography  Significant Diagnostic Tests:  CTA-abdomen and pelvis   Objective   Blood pressure (!) 162/62, pulse (!) 101, temperature 98.4 F (36.9 C), temperature source Oral, resp. rate (!) 22, height 4' 11 (1.499 m), weight 54.6 kg, SpO2 95%.        Intake/Output Summary (Last 24 hours) at 10/06/2024 0418 Last data filed at 10/06/2024 9587 Gross per 24 hour  Intake 986.13 ml  Output --  Net 986.13 ml   Filed Weights   10/05/24 1920  Weight: 54.6 kg    Examination: General: alert/oriented x3,  Lungs: CTAB, normal work of breathing Cardiovascular: RRR  Abdomen: soft, nontender  Extremities: warm, well-perfused  without cyanosis, edema Neuro: grossly nonfocal, follows commands  Resolved Hospital Problem list   Hypotension  Assessment & Plan:  CCM consult pending CTA-result available  Best practice:  Diet: N.p.o. Pain/Anxiety/Delirium protocol Mobility: As tolerated Code Status: Full Family Communication: Present Disposition: ICU  Labs   CBC: Recent Labs  Lab 10/05/24 1129 10/05/24 1635 10/05/24 1927 10/06/24 0203  WBC 3.5*  --   --  8.1  HGB 11.4* 8.7* 10.4* 10.3*  HCT 35.6* 27.2* 34.5* 32.4*  MCV 92.5  --   --  93.9  PLT 264  --   --  274    Basic Metabolic Panel: Recent Labs  Lab 10/05/24 1129 10/06/24 0203  NA 141 142  K 4.8 4.0  CL 107 108  CO2 23 19*  GLUCOSE 65* 189*  BUN 23 22  CREATININE 1.57* 1.25*  CALCIUM  9.8 9.0  MG  --  1.7   GFR: Estimated Creatinine Clearance: 25.3 mL/min (A) (by C-G formula based on SCr of 1.25 mg/dL (H)). Recent Labs  Lab 10/05/24 1129 10/06/24 0203  WBC 3.5* 8.1    Liver Function Tests: Recent Labs  Lab 10/05/24 1129  AST 34  ALT 16  ALKPHOS 80  BILITOT 0.3  PROT 7.4  ALBUMIN 4.1   No results for input(s): LIPASE, AMYLASE in the last 168 hours. No results for input(s): AMMONIA in the last 168 hours.  ABG    Component Value Date/Time   TCO2 22 03/10/2021 1506     Coagulation Profile: No results for input(s): INR, PROTIME in the last 168 hours.  Cardiac Enzymes: No results for input(s): CKTOTAL, CKMB, CKMBINDEX, TROPONINI in the last 168 hours.  HbA1C: Hgb A1c MFr Bld  Date/Time Value Ref Range Status  10/05/2024 07:27 PM 6.4 (H) 4.8 - 5.6 % Final    Comment:    (NOTE) Diagnosis of Diabetes The following HbA1c ranges recommended by the American Diabetes Association (ADA) may be used as an aid in the diagnosis of diabetes mellitus.  Hemoglobin             Suggested A1C NGSP%              Diagnosis  <5.7                   Non Diabetic  5.7-6.4                Pre-Diabetic  >6.4                    Diabetic  <7.0                   Glycemic control for                       adults with diabetes.    12/21/2022 09:04 AM 5.8 (H) 4.8 - 5.6 % Final    Comment:    (NOTE) Pre diabetes:          5.7%-6.4%  Diabetes:              >6.4%  Glycemic control for   <7.0% adults with diabetes     CBG: Recent Labs  Lab 10/05/24 2027 10/05/24 2111 10/06/24 0043 10/06/24 0219  GLUCAP 65* 118* 110* 141*    Past Medical History  She,  has a past medical history of Arthritis, Chronic headache, Complication of anesthesia, Diabetes mellitus, GERD (gastroesophageal reflux disease), GI bleed, Heart murmur, High cholesterol, Hyperlipidemia, Hypertension, Intracranial atherosclerosis, Migraine, Narcolepsy and cataplexy, OSA, Peripheral vascular disease, Rectal prolapse, Stage 3b chronic kidney disease (CKD) (HCC), and Trigger finger of all digits of right hand.   Surgical History    Past Surgical History:  Procedure Laterality Date   ABDOMINAL HYSTERECTOMY     Fibroids   BACK SURGERY     L4,L5 discectomy   BOTOX  INJECTION N/A 01/02/2023   Procedure: BOTOX  INJECTION;  Surgeon: Elisabeth Valli BIRCH, MD;  Location: [facility];  Service: Urology;  Laterality: N/A;   CARDIAC CATHETERIZATION     CARPAL TUNNEL RELEASE Right 01/18/2017   COLONOSCOPY N/A 07/28/2022   Procedure: COLONOSCOPY;  Surgeon: Rosalie Kitchens, MD;  Location: [facility];  Service: Gastroenterology;  Laterality: N/A;   COLONOSCOPY WITH PROPOFOL  N/A 06/17/2021   Procedure: COLONOSCOPY WITH PROPOFOL ;  Surgeon: Dianna Specking, MD;  Location: [facility];  Service: Endoscopy;  Laterality: N/A;   CYSTOSCOPY WITH INJECTION N/A 01/02/2023   Procedure: CYSTOSCOPY WITH LUEVENIA;  Surgeon: Elisabeth Valli BIRCH, MD;  Location: [facility];  Service: Urology;  Laterality: N/A;  45 MINS   ESOPHAGOGASTRODUODENOSCOPY (EGD) WITH PROPOFOL  N/A 06/17/2021   Procedure: ESOPHAGOGASTRODUODENOSCOPY (EGD) WITH PROPOFOL ;  Surgeon: Dianna Specking,  MD;  Location: [facility];  Service: Endoscopy;  Laterality: N/A;   IR ANGIOGRAM SELECTIVE EACH ADDITIONAL VESSEL  04/06/2024   IR ANGIOGRAM SELECTIVE EACH ADDITIONAL VESSEL  04/06/2024   IR ANGIOGRAM SELECTIVE EACH ADDITIONAL VESSEL  04/06/2024   IR ANGIOGRAM SELECTIVE EACH ADDITIONAL VESSEL  04/06/2024   IR ANGIOGRAM SELECTIVE EACH ADDITIONAL VESSEL  04/06/2024   IR ANGIOGRAM SELECTIVE EACH ADDITIONAL VESSEL  04/06/2024   IR ANGIOGRAM SELECTIVE EACH ADDITIONAL VESSEL  10/05/2024   IR ANGIOGRAM SELECTIVE EACH ADDITIONAL VESSEL  10/05/2024   IR ANGIOGRAM SELECTIVE EACH ADDITIONAL VESSEL  10/05/2024   IR ANGIOGRAM SELECTIVE EACH ADDITIONAL VESSEL  10/05/2024   IR ANGIOGRAM VISCERAL SELECTIVE  04/06/2024   IR ANGIOGRAM VISCERAL SELECTIVE  10/05/2024   IR EMBO ART  VEN HEMORR LYMPH EXTRAV  INC GUIDE ROADMAPPING  04/06/2024   IR US  GUIDE VASC ACCESS RIGHT  04/06/2024   IR US  GUIDE VASC ACCESS RIGHT  10/05/2024   KIDNEY SURGERY  1998   Right.  growth removed   POLYPECTOMY  06/17/2021   Procedure: POLYPECTOMY;  Surgeon: Dianna Specking, MD;  Location: [facility];  Service: Endoscopy;;   POLYPECTOMY  07/28/2022   Procedure: POLYPECTOMY;  Surgeon: Rosalie Kitchens, MD;  Location: [facility];  Service: Gastroenterology;;   rectal prolapse repair  12/05/2011   ROTATOR CUFF REPAIR     Left   SHOULDER ARTHROSCOPY WITH SUBACROMIAL DECOMPRESSION, ROTATOR CUFF REPAIR AND BICEP TENDON REPAIR Left 05/25/2015   Procedure: SHOULDER ARTHROSCOPY WITH SUBACROMIAL DECOMPRESSION, ROTATOR CUFF REPAIR AND BICEP TENODESIS, DEBRIDEMENT. ;  Surgeon: Glendia Cordella Hutchinson, MD;  Location: [facility];  Service: Orthopedics;  Laterality: Left;  LEFT SHOULDER ROTATOR CUFF TEAR REPAIR, ARTHROSCOPY, DEBRIDEMENT, BICEPS TENODESIS, SUBACROMIAL DECOMPRESSION.   TOTAL ABDOMINAL HYSTERECTOMY     TRIGGER FINGER RELEASE Right 04/30/2018   Procedure: RELEASE TRIGGER FINGER/A-1 PULLEY RIGHT MIDDLE  AND SMALLn ;  Surgeon: Murrell Kuba, MD;   Location: [facility];  Service: Orthopedics;  Laterality: Right;  FAB, Bier block   TUBAL LIGATION       Social History   reports that she quit smoking about 43 years ago. Her smoking use included cigarettes. She has never used smokeless tobacco. She reports that she does not drink alcohol  and does not use drugs.   Family History   Her family history includes Cancer in her father; Heart disease in her brother and mother; Kidney cancer in her brother; Throat cancer in her father.   Allergies Allergies  Allergen Reactions   Codeine Anaphylaxis   Morphine And Codeine Anaphylaxis   Donepezil  Diarrhea   Lactose Intolerance (Gi) Diarrhea and Nausea Only   Mirabegron     Other Reaction(s): Caused her to go to the hospital   Morphine Sulfate     Other Reaction(s): bottom out   Rivaroxaban      Other Reaction(s): rectal bleeding     Home Medications  Prior to Admission medications   Medication Sig Start Date End Date Taking? Authorizing Provider  acetaminophen  (TYLENOL ) 500 MG tablet Take 500 mg by mouth daily.    [provider]  amLODipine  (NORVASC ) 2.5 MG tablet Take 1 tablet (2.5 mg total) by mouth daily. 04/08/24   Rai, Nydia POUR, MD  metFORMIN  (GLUCOPHAGE ) 500 MG tablet Take 500 mg by mouth daily with breakfast.    [provider]  methocarbamol  (ROBAXIN ) 500 MG tablet Take 1 tablet (500 mg total) by mouth every 8 (eight) hours as needed for muscle spasms. 05/14/24   Persons, Ronal Dragon, [place]  metoprolol  succinate (  TOPROL -XL) 50 MG 24 hr tablet Take 50 mg by mouth in the morning.    [provider]  ondansetron  (ZOFRAN ) 4 MG tablet Take 1 tablet (4 mg total) by mouth every 8 (eight) hours as needed for nausea or vomiting. 04/08/24   Rai, Nydia POUR, MD  pantoprazole  (PROTONIX ) 40 MG tablet Take 1 tablet (40 mg total) by mouth daily before breakfast. 04/08/24 04/08/25  Rai, Nydia POUR, MD  rosuvastatin  (CRESTOR ) 40 MG tablet Take 1 tablet (40 mg  total) by mouth daily. 07/30/24   Michele Richardson, DO        ------------------------------------------------------------------ Update: 0428 hrs  Imaging in part:  IMPRESSION: 1. No acute findings to explain lower GI bleeding. 2. Postsurgical changes in the sigmoid and prior embolotherapy changes in the right lower quadrant. 3. Colonic diverticulosis without evidence of diverticulitis.   Electronically signed by: Oneil Devonshire MD 10/06/2024 04:04 AM EST RP Workstation: [email or web address]     Lynwood Kipper MSNA MSN ACNPC-AG Acute Care Nurse Practitioner [facility]

## 2024-10-06 NOTE — Consult Note (Signed)
 Referring Provider: Dr. Zella Primary Care Physician:  Sun, Vyvyan, MD Primary Gastroenterologist:  Dr. Dianna  Reason for Consultation:  GI Bleed  HPI: Stephanie Barber is a 83 y.o. female with acute onset of cherry red blood with clots per rectum with abdominal cramping as well. Felt lightheaded prior to coming to hospital. Denies N/V. CTA positive for bleeding in terminal ileum. Emoblization attempted but no active bleeding seen in ileocolic region. Repeat CTA negative for active bleeding. Hgb 10.7. Colonoscopy in 2023 showed diffuse diverticulosis. Previous partial sigmoid resection in past. No bleeding since last night.  Past Medical History:  Diagnosis Date   Arthritis    Chronic headache    Complication of anesthesia    allergy to Novocaine    Diabetes mellitus    GERD (gastroesophageal reflux disease)    GI bleed    Heart murmur    High cholesterol    Hyperlipidemia    Hypertension    Intracranial atherosclerosis    per MRA   Migraine    Narcolepsy and cataplexy    OSA    states has not used CPAP in over a year   Peripheral vascular disease    Rectal prolapse    Stage 3b chronic kidney disease (CKD) (HCC)    Trigger finger of all digits of right hand     Past Surgical History:  Procedure Laterality Date   ABDOMINAL HYSTERECTOMY     Fibroids   BACK SURGERY     L4,L5 discectomy   BOTOX  INJECTION N/A 01/02/2023   Procedure: BOTOX  INJECTION;  Surgeon: Elisabeth Valli BIRCH, MD;  Location: [facility];  Service: Urology;  Laterality: N/A;   CARDIAC CATHETERIZATION     CARPAL TUNNEL RELEASE Right 01/18/2017   COLONOSCOPY N/A 07/28/2022   Procedure: COLONOSCOPY;  Surgeon: Rosalie Kitchens, MD;  Location: [facility];  Service: Gastroenterology;  Laterality: N/A;   COLONOSCOPY WITH PROPOFOL  N/A 06/17/2021   Procedure: COLONOSCOPY WITH PROPOFOL ;  Surgeon: Dianna Specking, MD;  Location: [facility];  Service: Endoscopy;  Laterality: N/A;   CYSTOSCOPY WITH INJECTION N/A  01/02/2023   Procedure: CYSTOSCOPY WITH LUEVENIA;  Surgeon: Elisabeth Valli BIRCH, MD;  Location: [facility];  Service: Urology;  Laterality: N/A;  45 MINS   ESOPHAGOGASTRODUODENOSCOPY (EGD) WITH PROPOFOL  N/A 06/17/2021   Procedure: ESOPHAGOGASTRODUODENOSCOPY (EGD) WITH PROPOFOL ;  Surgeon: Dianna Specking, MD;  [facility];  Service: Endoscopy;  Laterality: N/A;   IR ANGIOGRAM SELECTIVE EACH ADDITIONAL VESSEL  04/06/2024   IR ANGIOGRAM SELECTIVE EACH ADDITIONAL VESSEL  04/06/2024   IR ANGIOGRAM SELECTIVE EACH ADDITIONAL VESSEL  04/06/2024   IR ANGIOGRAM SELECTIVE EACH ADDITIONAL VESSEL  04/06/2024   IR ANGIOGRAM SELECTIVE EACH ADDITIONAL VESSEL  04/06/2024   IR ANGIOGRAM SELECTIVE EACH ADDITIONAL VESSEL  04/06/2024   IR ANGIOGRAM SELECTIVE EACH ADDITIONAL VESSEL  10/05/2024   IR ANGIOGRAM SELECTIVE EACH ADDITIONAL VESSEL  10/05/2024   IR ANGIOGRAM SELECTIVE EACH ADDITIONAL VESSEL  10/05/2024   IR ANGIOGRAM SELECTIVE EACH ADDITIONAL VESSEL  10/05/2024   IR ANGIOGRAM VISCERAL SELECTIVE  04/06/2024   IR ANGIOGRAM VISCERAL SELECTIVE  10/05/2024   IR EMBO ART  VEN HEMORR LYMPH EXTRAV  INC GUIDE ROADMAPPING  04/06/2024   IR US  GUIDE VASC ACCESS RIGHT  04/06/2024   IR US  GUIDE VASC ACCESS RIGHT  10/05/2024   KIDNEY SURGERY  1998   Right.  growth removed   POLYPECTOMY  06/17/2021   Procedure: POLYPECTOMY;  Surgeon: Dianna Specking, MD;  [facility];  Service: Endoscopy;;  POLYPECTOMY  07/28/2022   Procedure: POLYPECTOMY;  Surgeon: Rosalie Kitchens, MD;  Location: [facility];  Service: Gastroenterology;;   rectal prolapse repair  12/05/2011   ROTATOR CUFF REPAIR     Left   SHOULDER ARTHROSCOPY WITH SUBACROMIAL DECOMPRESSION, ROTATOR CUFF REPAIR AND BICEP TENDON REPAIR Left 05/25/2015   Procedure: SHOULDER ARTHROSCOPY WITH SUBACROMIAL DECOMPRESSION, ROTATOR CUFF REPAIR AND BICEP TENODESIS, DEBRIDEMENT. ;  Surgeon: Glendia Cordella Hutchinson, MD;  Location: [facility];  Service: Orthopedics;  Laterality:  Left;  LEFT SHOULDER ROTATOR CUFF TEAR REPAIR, ARTHROSCOPY, DEBRIDEMENT, BICEPS TENODESIS, SUBACROMIAL DECOMPRESSION.   TOTAL ABDOMINAL HYSTERECTOMY     TRIGGER FINGER RELEASE Right 04/30/2018   Procedure: RELEASE TRIGGER FINGER/A-1 PULLEY RIGHT MIDDLE  AND SMALLn ;  Surgeon: Murrell Kuba, MD;  Location: [facility];  Service: Orthopedics;  Laterality: Right;  FAB, Bier block   TUBAL LIGATION      Prior to Admission medications   Medication Sig Start Date End Date Taking? Authorizing Provider  acetaminophen  (TYLENOL ) 500 MG tablet Take 500-1,000 mg by mouth See admin instructions. Take 1000mg  in the AM and 500mg  in the PM.   Yes [provider]  atorvastatin (LIPITOR) 10 MG tablet Take 10 mg by mouth daily.   Yes [provider]  metFORMIN  (GLUCOPHAGE ) 500 MG tablet Take 500 mg by mouth daily with breakfast.   Yes [provider]  methocarbamol  (ROBAXIN ) 500 MG tablet Take 1 tablet (500 mg total) by mouth every 8 (eight) hours as needed for muscle spasms. 05/14/24  Yes Persons, Ronal Dragon, [place]  metoprolol  succinate (TOPROL -XL) 50 MG 24 hr tablet Take 50 mg by mouth in the morning.   Yes [provider]  mirtazapine  (REMERON ) 15 MG tablet Take 15 mg by mouth at bedtime.   Yes [provider]  pantoprazole  (PROTONIX ) 40 MG tablet Take 1 tablet (40 mg total) by mouth daily before breakfast. 04/08/24 04/08/25 Yes Rai, Ripudeep K, MD  amLODipine  (NORVASC ) 2.5 MG tablet Take 1 tablet (2.5 mg total) by mouth daily. Patient not taking: Reported on 10/06/2024 04/08/24   Davia Nydia POUR, MD    Scheduled Meds:  sodium chloride    Intravenous Once   amLODipine   2.5 mg Oral Daily   Chlorhexidine  Gluconate Cloth  6 each Topical Daily   insulin  aspart  0-6 Units Subcutaneous Q4H   metoprolol  succinate  50 mg Oral Daily   Continuous Infusions:  sodium chloride  40 mL/hr at 10/06/24 0448   PRN Meds:.acetaminophen  **OR** acetaminophen , albuterol ,  hydrALAZINE , ondansetron  **OR** ondansetron  (ZOFRAN ) IV, mouth rinse, traZODone   Allergies as of 10/05/2024 - Review Complete 10/05/2024  Allergen Reaction Noted   Codeine Anaphylaxis 07/25/2011   Morphine and codeine Anaphylaxis 07/25/2011   Donepezil  Diarrhea 10/05/2024   Lactose intolerance (gi) Diarrhea and Nausea Only 09/13/2023   Mirabegron  10/05/2024   Morphine sulfate  10/05/2024   Rivaroxaban   10/05/2024    Family History  Problem Relation Age of Onset   Heart disease Mother    Throat cancer Father    Cancer Father        stomach   Kidney cancer Brother    Heart disease Brother     Social History   Socioeconomic History   Marital status: Married    Spouse name: Lamar   Number of children: 4   Years of education: 14   Highest education level: Not on file  Occupational History   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  Tobacco Use  Smoking status: Former    Current packs/day: 0.00    Types: Cigarettes    Quit date: 11/13/1980    Years since quitting: 43.9   Smokeless tobacco: Never   Tobacco comments:    quit in 1982  Vaping Use   Vaping status: Never Used  Substance and Sexual Activity   Alcohol  use: No    Alcohol /week: 0.0 standard drinks of alcohol    Drug use: No   Sexual activity: Yes  Other Topics Concern   Not on file  Social History Narrative   21 st January 2014 , patient underwent PS and MSLT - MSLT had one  SREMs and an average time to fall asleep of  *.8 minutes , her Ewort is 20 points,: facit: this patient has severe hypersomnia and is at risk when driving. medication in form ogf nuvigil smaples had been dispensed to her but she ha not yet taken it. 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 In detail -discussion of diagnosis and treatment  takes place today , first with nuvigil  and if insufficient, with XYREM. Her CPAP treats her OSA very well, and she uses it 6 hours  or more each night,  residual AHI of 1.1  would not allow for OSA to be still explaining this degree of sleepiness. 2 downloads were reviewed. labs reviewed.    Patient is married Beverlie) and lives at home with her husband and grandchild.   Patient has four children   Patient is retired.   Patient has a college education.   Patient is right-handed.   Patient does not drink any caffeine.      Social Drivers of Corporate Investment Banker Strain: Not on file  Food Insecurity: No Food Insecurity (10/05/2024)   Hunger Vital Sign    Worried About Running Out of Food in the Last Year: Never true    Ran Out of Food in the Last Year: Never true  Transportation Needs: No Transportation Needs (10/05/2024)   PRAPARE - Administrator, Civil Service (Medical): No    Lack of Transportation (Non-Medical): No  Physical Activity: Not on file  Stress: Not on file  Social Connections: Socially Integrated (10/05/2024)   Social Connection and Isolation Panel    Frequency of Communication with Friends and Family: More than three times a week    Frequency of Social Gatherings with Friends and Family: Twice a week    Attends Religious Services: More than 4 times per year    Active Member of [employer] or Organizations: Yes    Attends Banker Meetings: 1 to 4 times per year    Marital Status: Married  Catering Manager Violence: Not At Risk (10/05/2024)   Humiliation, Afraid, Rape, and Kick questionnaire    Fear of Current or Ex-Partner: No    Emotionally Abused: No    Physically Abused: No    Sexually Abused: No    Review of Systems: All negative except as stated above in HPI.  Physical Exam: Vital signs: Vitals:   10/06/24 0800 10/06/24 0900  BP: (!) 156/68 (!) 154/61  Pulse: 95 92  Resp: 20 12  Temp:    SpO2: 97% 96%  T 98.4  Last BM Date : 10/05/24 General:   Lethargic,  elderly, thin, no acute distress, pleaseant  Head: normocephalic,  atraumatic Eyes: anicteric sclera ENT: oropharynx clear Neck: supple, nontender Lungs:  Clear throughout to auscultation.   No wheezes, crackles, or rhonchi. No acute distress. Heart:  Regular rate and rhythm; no murmurs, clicks, rubs,  or gallops. Abdomen: soft, nontender, nondistended, +BS  Rectal:  Deferred Ext: no edema  GI:  Lab Results: Recent Labs    10/05/24 1129 10/05/24 1635 10/05/24 1927 10/06/24 0203 10/06/24 0954  WBC 3.5*  --   --  8.1  --   HGB 11.4*   < > 10.4* 10.3* 10.7*  HCT 35.6*   < > 34.5* 32.4* 33.5*  PLT 264  --   --  274  --    < > = values in this interval not displayed.   BMET Recent Labs    10/05/24 1129 10/06/24 0203  NA 141 142  K 4.8 4.0  CL 107 108  CO2 23 19*  GLUCOSE 65* 189*  BUN 23 22  CREATININE 1.57* 1.25*  CALCIUM  9.8 9.0   LFT Recent Labs    10/05/24 1129  PROT 7.4  ALBUMIN 4.1  AST 34  ALT 16  ALKPHOS 80  BILITOT 0.3   PT/INR No results for input(s): LABPROT, INR in the last 72 hours.    Impression/Plan: Lower GI bleed with initial CTA positive for bleed in terminal ileum. IR angiogram negative for active bleeding. Repeat CTA this morning negative for bleeding. No bleeding since last night. Hgb stable at 10.7. Ice chips and sips of water  ok today. Supportive care. Follow H/Hs. Will follow.    LOS: 1 day   [name]  10/06/2024, 10:40 AM  Questions please call [phone]

## 2024-10-07 DIAGNOSIS — K922 Gastrointestinal hemorrhage, unspecified: Secondary | ICD-10-CM | POA: Diagnosis not present

## 2024-10-07 LAB — CBC
HCT: 27.7 % — ABNORMAL LOW (ref 36.0–46.0)
Hemoglobin: 8.8 g/dL — ABNORMAL LOW (ref 12.0–15.0)
MCH: 29.2 pg (ref 26.0–34.0)
MCHC: 31.8 g/dL (ref 30.0–36.0)
MCV: 92 fL (ref 80.0–100.0)
Platelets: 217 K/uL (ref 150–400)
RBC: 3.01 MIL/uL — ABNORMAL LOW (ref 3.87–5.11)
RDW: 14 % (ref 11.5–15.5)
WBC: 3.4 K/uL — ABNORMAL LOW (ref 4.0–10.5)
nRBC: 0 % (ref 0.0–0.2)

## 2024-10-07 LAB — BASIC METABOLIC PANEL WITH GFR
Anion gap: 9 (ref 5–15)
BUN: 21 mg/dL (ref 8–23)
CO2: 21 mmol/L — ABNORMAL LOW (ref 22–32)
Calcium: 8.8 mg/dL — ABNORMAL LOW (ref 8.9–10.3)
Chloride: 111 mmol/L (ref 98–111)
Creatinine, Ser: 1.34 mg/dL — ABNORMAL HIGH (ref 0.44–1.00)
GFR, Estimated: 39 mL/min — ABNORMAL LOW (ref >60)
Glucose, Bld: 100 mg/dL — ABNORMAL HIGH (ref 70–99)
Potassium: 4.1 mmol/L (ref 3.5–5.1)
Sodium: 141 mmol/L (ref 135–145)

## 2024-10-07 LAB — HEMOGLOBIN AND HEMATOCRIT, BLOOD
HCT: 26.7 % — ABNORMAL LOW (ref 36.0–46.0)
HCT: 27.5 % — ABNORMAL LOW (ref 36.0–46.0)
Hemoglobin: 8.6 g/dL — ABNORMAL LOW (ref 12.0–15.0)
Hemoglobin: 8.8 g/dL — ABNORMAL LOW (ref 12.0–15.0)

## 2024-10-07 LAB — GLUCOSE, CAPILLARY
Glucose-Capillary: 96 mg/dL (ref 70–99)
Glucose-Capillary: 99 mg/dL (ref 70–99)
Glucose-Capillary: 98 mg/dL (ref 70–99)
Glucose-Capillary: 85 mg/dL (ref 70–99)
Glucose-Capillary: 164 mg/dL — ABNORMAL HIGH (ref 70–99)
Glucose-Capillary: 163 mg/dL — ABNORMAL HIGH (ref 70–99)

## 2024-10-07 MED ORDER — DEXTROSE-SODIUM CHLORIDE 5-0.9 % IV SOLN: INTRAVENOUS | Status: AC

## 2024-10-07 MED ORDER — PANTOPRAZOLE SODIUM 40 MG IV SOLR
40.0000 mg | Freq: Every day | INTRAVENOUS | Status: DC
  Administered 2024-10-07: 40 mg via INTRAVENOUS
  Filled 2024-10-07: qty 10

## 2024-10-07 MED ORDER — MELATONIN 3 MG PO TABS
6.0000 mg | ORAL_TABLET | Freq: Every day | ORAL | Status: DC
  Administered 2024-10-07 – 2024-10-11 (×5): 6 mg via ORAL
  Filled 2024-10-07 (×5): qty 2

## 2024-10-07 MED ORDER — ALUM & MAG HYDROXIDE-SIMETH 200-200-20 MG/5ML PO SUSP
30.0000 mL | Freq: Once | ORAL | Status: AC
  Administered 2024-10-07: 30 mL via ORAL
  Filled 2024-10-07: qty 30

## 2024-10-07 MED ORDER — FENTANYL CITRATE (PF) 50 MCG/ML IJ SOSY
12.5000 ug | PREFILLED_SYRINGE | Freq: Once | INTRAMUSCULAR | Status: AC
  Administered 2024-10-07: 12.5 ug via INTRAVENOUS
  Filled 2024-10-07: qty 1

## 2024-10-07 MED ORDER — LIDOCAINE VISCOUS HCL 2 % MT SOLN
15.0000 mL | Freq: Once | OROMUCOSAL | Status: AC
  Administered 2024-10-07: 15 mL via ORAL
  Filled 2024-10-07: qty 15

## 2024-10-07 NOTE — Plan of Care (Signed)
  Problem: Education: Goal: Ability to describe self-care measures that may prevent or decrease complications (Diabetes Survival Skills Education) will improve Outcome: Progressing   Problem: Coping: Goal: Ability to adjust to condition or change in health will improve Outcome: Progressing   Problem: Fluid Volume: Goal: Ability to maintain a balanced intake and output will improve Outcome: Progressing   Problem: Nutritional: Goal: Maintenance of adequate nutrition will improve Outcome: Progressing Goal: Progress toward achieving an optimal weight will improve Outcome: Progressing   Problem: Tissue Perfusion: Goal: Adequacy of tissue perfusion will improve Outcome: Progressing

## 2024-10-07 NOTE — TOC Initial Note (Signed)
 Transition of Care [facility]) - Initial/Assessment Note    Patient Details  Name: Stephanie Barber MRN: [id number] Date of Birth: 02/25/1941  [facility] CM/SW Contact:    Jon ONEIDA Anon, RN Phone Number: 10/07/2024, 4:08 PM  Clinical Narrative:                 Pt is from home with spouse. Admitted due to bleeding from the rectum. RNCM met with pt and her spouse at bedside. Introduced role in DC planning. Verifies PCP/insurance. Denies any current [facility] services, or any DME in the home. Pt spouse states pt would greatly benefit from [employer] in the home. No SDOH risks identified. Spouse states he will provide transportation at time of discharge. Pt needing continued medical workup, not medically stable to discharge. IP CM will follow for any DC planning needs.     Expected Discharge Plan: Home/Self Care Barriers to Discharge: Continued Medical Work up   Patient Goals and CMS Choice Patient states their goals for this hospitalization and ongoing recovery are:: To return home with spouse CMS Medicare.gov Compare Post Acute Care list provided to:: Patient Choice offered to / list presented to : Patient  ownership interest in [facility].provided to:: Patient    Expected Discharge Plan and Services In-house Referral: NA Discharge Planning Services: CM Consult Post Acute Care Choice: Durable Medical Equipment Living arrangements for the past 2 months: Single Family Home                 DME Arranged: N/A DME Agency: NA       HH Arranged: NA HH Agency: NA        Prior Living Arrangements/Services Living arrangements for the past 2 months: Single Family Home Lives with:: Spouse Patient language and need for interpreter reviewed:: Yes Do you feel safe going back to the place where you live?: Yes          Current home services: DME Criminal Activity/Legal Involvement Pertinent to Current Situation/Hospitalization: No - Comment as  needed  Activities of Daily Living   ADL Screening (condition at time of admission) Independently performs ADLs?: Yes (appropriate for developmental age) Is the patient deaf or have difficulty hearing?: No Does the patient have difficulty seeing, even when wearing glasses/contacts?: No Does the patient have difficulty concentrating, remembering, or making decisions?: No  Permission Sought/Granted Permission sought to share information with : Family Supports Permission granted to share information with : Yes, Verbal Permission Granted  Share Information with NAME: Tarry, Blayney (Spouse)  [phone]           Emotional Assessment Appearance:: Appears older than stated age Attitude/Demeanor/Rapport: Engaged Affect (typically observed): Appropriate Orientation: : Oriented to Self, Oriented to Place, Oriented to  Time, Oriented to Situation Alcohol  / Substance Use: Not Applicable Psych Involvement: No (comment)  Admission diagnosis:  Lower GI bleed [K92.2] Patient Active Problem List   Diagnosis Date Noted   Lower GI bleed 10/05/2024   Torn ear lobe 04/15/2024   Acute lower GI bleeding 04/05/2024   Torn earlobe 01/11/2024   Impingement syndrome of left shoulder 10/31/2022   Other spondylosis with radiculopathy, cervical region 10/12/2022   GIB (gastrointestinal bleeding) 09/20/2022   Pancreatic insufficiency 09/20/2022   GI bleeding 07/23/2022   Stage 3b chronic kidney disease (CKD) (HCC)    Other spondylosis with radiculopathy, lumbar region 09/19/2021   IDA (iron  deficiency anemia) 07/06/2021   Protein-calorie malnutrition, severe 06/21/2021   Acute blood loss anemia  Malnutrition of moderate degree 06/16/2021   Hematochezia 06/15/2021   Acute GI bleeding 06/15/2021   Hyperlipidemia associated with type 2 diabetes mellitus (HCC) 06/15/2021   Left-sided headache 06/30/2019   Memory loss 06/30/2019   AKI (acute kidney injury) 05/06/2017   Dehydration 05/06/2017    Chest pain 05/06/2017   Wound infection after surgery 01/30/2017   Palpitations 08/24/2016   Noncompliance with CPAP treatment 12/28/2015   Poor compliance with CPAP treatment 10/26/2014   CPAP use counseling 07/28/2014   OSA on CPAP 07/28/2014   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   Hypersomnolent 04/02/2014   Obstructive sleep apnea 04/02/2014   Non-compliance with treatment 03/05/2014   Type 2 diabetes mellitus (HCC) 12/05/2011   Hypertension 12/05/2011   GERD (gastroesophageal reflux disease)    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   Chronic cough 07/26/2011   PCP:  Sun, Vyvyan, MD Pharmacy:   [facility] - [place], [place] - [address] [facility] [address] [place] [address] Phone: [phone] Fax: [phone]  [facility] [address], [place] - [address] [place] [address] Phone: [phone] Fax: [phone]     Social Drivers of Health (SDOH) Social History: SDOH Screenings   Food Insecurity: No Food Insecurity (10/05/2024)  Housing: Low Risk  (10/05/2024)  Transportation Needs: No Transportation Needs (10/05/2024)  Utilities: Not At Risk (10/05/2024)  Social Connections: Socially Integrated (10/05/2024)  Tobacco Use: Medium Risk (10/05/2024)   SDOH Interventions:     Readmission Risk Interventions    10/07/2024    4:06 PM  Readmission Risk Prevention Plan  Transportation Screening Complete  PCP or Specialist Appt within 5-7 Days Complete  Home Care Screening Complete  Medication Review (RN CM) Complete

## 2024-10-07 NOTE — Addendum Note (Signed)
 Addended by: ELDONNA GARDINER POUR on: 10/07/2024 08:28 AM   Modules accepted: Orders

## 2024-10-07 NOTE — Progress Notes (Incomplete)
 Patient continues to complain of chest pain, on call providers aware,

## 2024-10-07 NOTE — Plan of Care (Signed)

## 2024-10-07 NOTE — Progress Notes (Signed)
 PROGRESS NOTE    Stephanie Barber  FMW:[id number] DOB: 1941/09/05 DOA: 10/05/2024 PCP: Sun, Vyvyan, MD    Brief Narrative:   Stephanie Barber is a 83 y.o. 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  Onset night prior.  Started having abdominal cramping pain throughout the day with reported bright red blood per rectum.  Denies any antiplatelet, anticoagulant use.  Denies NSAID use.  In the ED, temperature 97.9 F, HR 62, RR 18, BP 124/84, SpO2 99% on 2 L nasal cannula.  WBC 3.5, hemoglobin 11.4, platelet count 264.  Sodium 141, potassium 4.8, chloride 107, CO2 23, glucose 65, BUN 23, creat 1.57.  AST 34, ALT 16, total bilirubin 0.3.  FOBT positive.  Interventional radiology consulted for angiogram.  GI consulted.  TRH consulted for admission for further evaluation management of lower GI bleed.  Assessment & Plan:   Lower GI bleed, terminal ileum Hx colonic pseudoaneurysm s/p embolization Patient presenting with bright red blood per rectum.  Denies NSAID use, no anticoagulant/antiplatelet use.  History of lower GI bleed related to colonic surrounds him and underwent embolization in the past.  Hemoglobin 11.4; with repeat dropping to 8.7.  CT angiogram GI bleed study initially positive for active bleeding terminal ileum.  Interventional radiology was consulted and patient underwent IR visceral angiogram with no clear evidence for active GI bleeding from the superior mesenteric artery and ileocolic branches, and no embolization was performed.  Shortly following admission, patient's heart rate and blood pressure dropped transiently with repeat hemoglobin thereafter 10.4.  Repeat CT angiogram abdomen/pelvis was then performed with no acute findings to explain lower GI bleeding, noted postsurgical changes in the sigmoid and prior  embolotherapy changes in the right lower quadrant.  -- [facility] following, appreciate assistance -- Hgb 11.4>8.7>10.4>10.3>10.7>9.4>8.8 -- N.p.o., sips of water , ice chips okay -- H&H every 6 hours -- Monitor on telemetry -- CBC in a.m. -- Given initial finding of bleeding terminal ileum on CT angiogram; despite any significant findings on arteriogram and repeat CT angiogram abdomen/pelvis; continues with a downtrend of hemoglobin and may warrant further evaluation with endoscopy; await further recommendations from GI.  Bradycardia/hypotension, transient; suspected due to vasovagal event: Resolved Overnight, patient's heart rate and blood pressure decreased transiently.  Repeat hemoglobin stable.  Underwent CT angiogram abdomen/pelvis with no significant findings.  Seen by pulmonary critical care medicine.  Received IV fluid bolus with resolution of hypotension and bradycardia. -- Continue to monitor on telemetry  DM2 Hemoglobin C 6.4%.  At baseline on metformin  500 mg p.o. daily. -- Hold oral hypoglycemics while inpatient -- Very sensitive SSI for coverage -- CBGs every 4 hours, currently n.p.o.  HTN On amlodipine  2.5 g p.o. daily, metoprolol  succinate 50 g p.o. daily at baseline.  Episode of bradycardia and hypotension earlier this morning. -- Will hold home antihypertensives for now -- Monitor BP closely  HLD -- Holding home atorvastatin for now  CKD stage IIIb Baseline creatinine 1.2-1.6, stable -- Cr 1.57>1.25>1.34 -- BMP in am   DVT prophylaxis: SCDs Start: 10/05/24 1754    Code Status: Full Code Family Communication: No family present at bedside this morning  Disposition Plan:  Level of care: Stepdown Status is: Inpatient Remains inpatient appropriate because: Awaiting recommendations from GI given downtrend of hemoglobin    Consultants:  [facility]  Procedures:  None  Antimicrobials:  None   Subjective: Patient seen examined bedside, lying in  bed.  No family present.  RN reports patient confused after receiving trazodone  overnight, now back to baseline.  Hemoglobin continued to drift down, down to 8.8 this morning.  Awaiting further GI recommendations. Denies headache, no dizziness, no chest pain, no palpitations, no shortness of breath, no abdominal pain, no fever/chills/night sweats, no nausea/vomiting/diarrhea, no focal weakness, no fatigue, no paresthesia.  No other acute events overnight per nursing.  Discussed with RN this morning.  Objective: Vitals:   10/07/24 0700 10/07/24 0800 10/07/24 0855 10/07/24 0900  BP: (!) 155/56 (!) 146/62  (!) 147/65  Pulse: 68 73 76 70  Resp: 13 13 18 20   Temp:  98.6 F (37 C)    TempSrc:  Oral    SpO2: 97% 97% 97% 96%  Weight:      Height:        Intake/Output Summary (Last 24 hours) at 10/07/2024 1023 Last data filed at 10/07/2024 0800 Gross per 24 hour  Intake 873.81 ml  Output 1350 ml  Net -476.19 ml   Filed Weights   10/05/24 1920  Weight: 54.6 kg    Examination:  Physical Exam: GEN: NAD, alert and oriented x 3,  elderly in appearance HEENT: NCAT, PERRL, EOMI, sclera clear, MMM PULM: CTAB w/o wheezes/crackles, normal respiratory effort, on room air CV: RRR w/o M/G/R GI: abd soft, NTND, + BS MSK: no peripheral edema, moves all extremities independently NEURO: CN II-XII intact, no focal deficits, sensation to light touch intact PSYCH: normal mood/affect Integumentary: dry/intact, no rashes or wounds    Data Reviewed: I have personally reviewed following labs and imaging studies  CBC: Recent Labs  Lab 10/05/24 1129 10/05/24 1635 10/05/24 1927 10/06/24 0203 10/06/24 0954 10/06/24 1741 10/07/24 0640  WBC 3.5*  --   --  8.1  --   --  3.4*  HGB 11.4*   < > 10.4* 10.3* 10.7* 9.4* 8.8*  HCT 35.6*   < > 34.5* 32.4* 33.5* 29.1* 27.7*  MCV 92.5  --   --  93.9  --   --  92.0  PLT 264  --   --  274  --   --  217   < > = values in this interval not displayed.   Basic  Metabolic Panel: Recent Labs  Lab 10/05/24 1129 10/06/24 0203 10/07/24 0324  NA 141 142 141  K 4.8 4.0 4.1  CL 107 108 111  CO2 23 19* 21*  GLUCOSE 65* 189* 100*  BUN 23 22 21   CREATININE 1.57* 1.25* 1.34*  CALCIUM  9.8 9.0 8.8*  MG  --  1.7  --    GFR: Estimated Creatinine Clearance: 23.6 mL/min (A) (by C-G formula based on SCr of 1.34 mg/dL (H)). Liver Function Tests: Recent Labs  Lab 10/05/24 1129  AST 34  ALT 16  ALKPHOS 80  BILITOT 0.3  PROT 7.4  ALBUMIN 4.1   No results for input(s): LIPASE, AMYLASE in the last 168 hours. No results for input(s): AMMONIA in the last 168 hours. Coagulation Profile: No results for input(s): INR, PROTIME in the last 168 hours. Cardiac Enzymes: No results for input(s): CKTOTAL, CKMB, CKMBINDEX, TROPONINI in the last 168 hours. BNP (last 3 results) No results for input(s): PROBNP in the last 8760 hours. HbA1C: Recent Labs    10/05/24 1927  HGBA1C 6.4*   CBG: Recent Labs  Lab 10/06/24 1542 10/06/24 2021 10/06/24 2353 10/07/24 0343 10/07/24 0748  GLUCAP 70  95 109* 96 99   Lipid Profile: No results for input(s): CHOL, HDL, LDLCALC, TRIG, CHOLHDL, LDLDIRECT in the last 72 hours. Thyroid  Function Tests: No results for input(s): TSH, T4TOTAL, FREET4, T3FREE, THYROIDAB in the last 72 hours. Anemia Panel: No results for input(s): VITAMINB12, FOLATE, FERRITIN, TIBC, IRON , RETICCTPCT in the last 72 hours. Sepsis Labs: No results for input(s): PROCALCITON, LATICACIDVEN in the last 168 hours.  Recent Results (from the past 240 hours)  MRSA Next Gen by PCR, Nasal     Status: None   Collection Time: 10/06/24  6:13 PM   Specimen: Nasal Mucosa; Nasal Swab  Result Value Ref Range Status   MRSA by PCR Next Gen NOT DETECTED NOT DETECTED Final    Comment: (NOTE) The GeneXpert MRSA Assay (FDA approved for NASAL specimens only), is one component of a comprehensive MRSA  colonization surveillance program. It is not intended to diagnose MRSA infection nor to guide or monitor treatment for MRSA infections. Test performance is not FDA approved in patients less than 23 years old. Performed at [facility], [address] [place], [place] [address]          Radiology Studies: CT Angio Abd/Pel w/ and/or w/o Result Date: 10/06/2024 EXAM: CTA ABDOMEN AND PELVIS WITHOUT AND WITH CONTRAST 10/06/2024 03:42:28 AM TECHNIQUE: CTA images of the abdomen and pelvis without and with intravenous contrast. 75 mL iohexol  (OMNIPAQUE ) 300 MG/ML solution was administered. Three-dimensional MIP/volume rendered formations were performed. Automated exposure control, iterative reconstruction, and/or weight based adjustment of the mA/kV was utilized to reduce the radiation dose to as low as reasonably achievable. COMPARISON: 10/05/2024 CLINICAL HISTORY: Lower GI bleed. FINDINGS: VASCULATURE: AORTA: Prominent chronic calcifications of the aorta are noted without aneurysmal dilatation. No dissection. CELIAC TRUNK: No acute finding. No occlusion or significant stenosis. SUPERIOR MESENTERIC ARTERY: No acute finding. No occlusion or significant stenosis. RENAL ARTERIES: No acute finding. No occlusion or significant stenosis. ILIAC ARTERIES: No acute finding. No occlusion or significant stenosis. LIVER: The liver is within normal limits. GALLBLADDER AND BILE DUCTS: Gallbladder is within normal limits. No biliary ductal dilatation. SPLEEN: The spleen is unremarkable. PANCREAS: The pancreas is unremarkable. ADRENAL GLANDS: Bilateral adrenal glands show multiple renal cysts. No follow-up is recommended. KIDNEYS, URETERS AND BLADDER: Kidneys show multiple renal cysts. No follow-up is recommended. No stones in the kidneys or ureters. The bladder is well distended with opacified urine and is otherwise unremarkable. GI AND BOWEL: Stomach and duodenal sweep demonstrate no acute  abnormality. Small bowel is within normal limits. The area of hemorrhage in the terminal ileum is no longer identified. No obstructive or inflammatory changes of the colon are noted. Postsurgical changes in the sigmoid are seen. The appendix is within normal limits. Scattered diverticular changes are noted without inflammatory changes. No areas of pooling of contrast are noted to suggest extravasation. Some retained hyperdense material is noted within the colon related to prior contrast extravasation. There is no bowel obstruction. No abnormal bowel wall thickening or distension. REPRODUCTIVE: The uterus has been surgically removed. PERITONEUM AND RETRPERITONEUM: No ascites or free air. No free fluid is noted. LUNG BASE: Bases are free of acute infiltrate. Mild scarring is noted bilaterally. LYMPH NODES: No lymphadenopathy. BONES AND SOFT TISSUES: Osseous structures show degenerative changes of the lumbar spine. Changes of embolotherapy in the right lower quadrant is again seen. IMPRESSION: 1. No acute findings to explain lower GI bleeding. 2. Postsurgical changes in the sigmoid and prior embolotherapy changes in the right lower  quadrant. 3. Colonic diverticulosis without evidence of diverticulitis. Electronically signed by: Oneil Devonshire MD 10/06/2024 04:04 AM EST RP Workstation: [id number]   IR US  Guide Vasc Access Right Result Date: 10/05/2024 INDICATION: 83 year old with acute GI bleed. CTA demonstrates bleeding at the terminal ileum. Patient had a similar area of bleeding in May 2025 and previous catheter directed coil embolization. EXAM: 1. Visceral angiography: Superior mesenteric artery 2. Visceral angiography: 2 second order branches of the superior mesenteric artery; ileocecal artery and small bowel branch 3. Visceral angiography: 2 third order branches of the superior mesenteric artery; superior ileocolic branch and inferior ileocolic branch 4. Ultrasound guidance for vascular access MEDICATIONS:  Moderate sedation ANESTHESIA/SEDATION: Moderate (conscious) sedation was employed during this procedure. A total of Versed  2 mg and Fentanyl  100 mcg was administered intravenously by the radiology nurse. Total intra-service moderate Sedation Time: 32 minutes. The patient's level of consciousness and vital signs were monitored continuously by radiology nursing throughout the procedure under my direct supervision. CONTRAST:  60 mL Omnipaque  300 FLUOROSCOPY: Radiation Exposure Index (as provided by the fluoroscopic device): 494 mGy Kerma COMPLICATIONS: None immediate. PROCEDURE: Informed consent was obtained from the patient following explanation of the procedure, risks, benefits and alternatives. The patient understands, agrees and consents for the procedure. All questions were addressed. A time out was performed prior to the initiation of the procedure. Right groin was prepped and draped in sterile fashion. Maximal barrier sterile technique was utilized including caps, mask, sterile gowns, sterile gloves, sterile drape, hand hygiene and skin antiseptic. Ultrasound confirmed a patent right common femoral artery. Ultrasound image was saved for documentation. Right groin was anesthetized with 1% lidocaine . A small incision was made. Using ultrasound guidance, 21 gauge needle was directed into the right common femoral artery and a micropuncture dilator set was placed. Five French vascular sheath was placed over a Bentson wire. C2 catheter was used to cannulate the superior mesenteric artery. Selective SMA arteriogram was performed. STC microcatheter was advanced into the second order ileocolic branch. Selective angiography was performed of this artery. Microcatheter was used to select the superior and inferior ileocolic artery branches. Selective angiography was performed in each artery. Catheter was pulled back into the main ileocolic branch and additional angiography was performed. Microcatheter was used to cannulate a  second order branch of the SMA along the right side of the abdomen. This appeared to be a small bowel branch likely representing ileal branches. Microcatheter and C2 catheter were removed. Oblique angiography was performed through the right groin sheath. Right groin sheath was removed using Angio-Seal closure device. Right groin hemostasis at the end of the procedure. FINDINGS: Superior mesenteric artery is patent. The initial superior mesenteric arteriogram raised concern for a small focus of contrast extravasation in the expected region of the terminal ileum. Therefore, selective angiography was performed in the ileocolic artery and ileocolic artery branches. However, there was never re-demonstration of contrast extravasation, contrast staining, early venous filling or a vascular malformation. No evidence for contrast extravasation involving the right-sided small bowel arteries. IMPRESSION: 1. No clear evidence for active GI bleeding from the superior mesenteric artery and ileocolic branches. Embolization was not performed. Electronically Signed   By: Juliene Balder M.D.   On: 10/05/2024 22:26   IR Angiogram Visceral Selective Result Date: 10/05/2024 INDICATION: 83 year old with acute GI bleed. CTA demonstrates bleeding at the terminal ileum. Patient had a similar area of bleeding in May 2025 and previous catheter directed coil embolization. EXAM: 1. Visceral angiography: Superior mesenteric artery 2. Visceral  angiography: 2 second order branches of the superior mesenteric artery; ileocecal artery and small bowel branch 3. Visceral angiography: 2 third order branches of the superior mesenteric artery; superior ileocolic branch and inferior ileocolic branch 4. Ultrasound guidance for vascular access MEDICATIONS: Moderate sedation ANESTHESIA/SEDATION: Moderate (conscious) sedation was employed during this procedure. A total of Versed  2 mg and Fentanyl  100 mcg was administered intravenously by the radiology nurse.  Total intra-service moderate Sedation Time: 32 minutes. The patient's level of consciousness and vital signs were monitored continuously by radiology nursing throughout the procedure under my direct supervision. CONTRAST:  60 mL Omnipaque  300 FLUOROSCOPY: Radiation Exposure Index (as provided by the fluoroscopic device): 494 mGy Kerma COMPLICATIONS: None immediate. PROCEDURE: Informed consent was obtained from the patient following explanation of the procedure, risks, benefits and alternatives. The patient understands, agrees and consents for the procedure. All questions were addressed. A time out was performed prior to the initiation of the procedure. Right groin was prepped and draped in sterile fashion. Maximal barrier sterile technique was utilized including caps, mask, sterile gowns, sterile gloves, sterile drape, hand hygiene and skin antiseptic. Ultrasound confirmed a patent right common femoral artery. Ultrasound image was saved for documentation. Right groin was anesthetized with 1% lidocaine . A small incision was made. Using ultrasound guidance, 21 gauge needle was directed into the right common femoral artery and a micropuncture dilator set was placed. Five French vascular sheath was placed over a Bentson wire. C2 catheter was used to cannulate the superior mesenteric artery. Selective SMA arteriogram was performed. STC microcatheter was advanced into the second order ileocolic branch. Selective angiography was performed of this artery. Microcatheter was used to select the superior and inferior ileocolic artery branches. Selective angiography was performed in each artery. Catheter was pulled back into the main ileocolic branch and additional angiography was performed. Microcatheter was used to cannulate a second order branch of the SMA along the right side of the abdomen. This appeared to be a small bowel branch likely representing ileal branches. Microcatheter and C2 catheter were removed. Oblique  angiography was performed through the right groin sheath. Right groin sheath was removed using Angio-Seal closure device. Right groin hemostasis at the end of the procedure. FINDINGS: Superior mesenteric artery is patent. The initial superior mesenteric arteriogram raised concern for a small focus of contrast extravasation in the expected region of the terminal ileum. Therefore, selective angiography was performed in the ileocolic artery and ileocolic artery branches. However, there was never re-demonstration of contrast extravasation, contrast staining, early venous filling or a vascular malformation. No evidence for contrast extravasation involving the right-sided small bowel arteries. IMPRESSION: 1. No clear evidence for active GI bleeding from the superior mesenteric artery and ileocolic branches. Embolization was not performed. Electronically Signed   By: Juliene Balder M.D.   On: 10/05/2024 22:26   IR Angiogram Selective Each Additional Vessel Result Date: 10/05/2024 INDICATION: 83 year old with acute GI bleed. CTA demonstrates bleeding at the terminal ileum. Patient had a similar area of bleeding in May 2025 and previous catheter directed coil embolization. EXAM: 1. Visceral angiography: Superior mesenteric artery 2. Visceral angiography: 2 second order branches of the superior mesenteric artery; ileocecal artery and small bowel branch 3. Visceral angiography: 2 third order branches of the superior mesenteric artery; superior ileocolic branch and inferior ileocolic branch 4. Ultrasound guidance for vascular access MEDICATIONS: Moderate sedation ANESTHESIA/SEDATION: Moderate (conscious) sedation was employed during this procedure. A total of Versed  2 mg and Fentanyl  100 mcg was administered intravenously by the  radiology nurse. Total intra-service moderate Sedation Time: 32 minutes. The patient's level of consciousness and vital signs were monitored continuously by radiology nursing throughout the procedure  under my direct supervision. CONTRAST:  60 mL Omnipaque  300 FLUOROSCOPY: Radiation Exposure Index (as provided by the fluoroscopic device): 494 mGy Kerma COMPLICATIONS: None immediate. PROCEDURE: Informed consent was obtained from the patient following explanation of the procedure, risks, benefits and alternatives. The patient understands, agrees and consents for the procedure. All questions were addressed. A time out was performed prior to the initiation of the procedure. Right groin was prepped and draped in sterile fashion. Maximal barrier sterile technique was utilized including caps, mask, sterile gowns, sterile gloves, sterile drape, hand hygiene and skin antiseptic. Ultrasound confirmed a patent right common femoral artery. Ultrasound image was saved for documentation. Right groin was anesthetized with 1% lidocaine . A small incision was made. Using ultrasound guidance, 21 gauge needle was directed into the right common femoral artery and a micropuncture dilator set was placed. Five French vascular sheath was placed over a Bentson wire. C2 catheter was used to cannulate the superior mesenteric artery. Selective SMA arteriogram was performed. STC microcatheter was advanced into the second order ileocolic branch. Selective angiography was performed of this artery. Microcatheter was used to select the superior and inferior ileocolic artery branches. Selective angiography was performed in each artery. Catheter was pulled back into the main ileocolic branch and additional angiography was performed. Microcatheter was used to cannulate a second order branch of the SMA along the right side of the abdomen. This appeared to be a small bowel branch likely representing ileal branches. Microcatheter and C2 catheter were removed. Oblique angiography was performed through the right groin sheath. Right groin sheath was removed using Angio-Seal closure device. Right groin hemostasis at the end of the procedure. FINDINGS:  Superior mesenteric artery is patent. The initial superior mesenteric arteriogram raised concern for a small focus of contrast extravasation in the expected region of the terminal ileum. Therefore, selective angiography was performed in the ileocolic artery and ileocolic artery branches. However, there was never re-demonstration of contrast extravasation, contrast staining, early venous filling or a vascular malformation. No evidence for contrast extravasation involving the right-sided small bowel arteries. IMPRESSION: 1. No clear evidence for active GI bleeding from the superior mesenteric artery and ileocolic branches. Embolization was not performed. Electronically Signed   By: Juliene Balder M.D.   On: 10/05/2024 22:26   IR Angiogram Selective Each Additional Vessel Result Date: 10/05/2024 INDICATION: 83 year old with acute GI bleed. CTA demonstrates bleeding at the terminal ileum. Patient had a similar area of bleeding in May 2025 and previous catheter directed coil embolization. EXAM: 1. Visceral angiography: Superior mesenteric artery 2. Visceral angiography: 2 second order branches of the superior mesenteric artery; ileocecal artery and small bowel branch 3. Visceral angiography: 2 third order branches of the superior mesenteric artery; superior ileocolic branch and inferior ileocolic branch 4. Ultrasound guidance for vascular access MEDICATIONS: Moderate sedation ANESTHESIA/SEDATION: Moderate (conscious) sedation was employed during this procedure. A total of Versed  2 mg and Fentanyl  100 mcg was administered intravenously by the radiology nurse. Total intra-service moderate Sedation Time: 32 minutes. The patient's level of consciousness and vital signs were monitored continuously by radiology nursing throughout the procedure under my direct supervision. CONTRAST:  60 mL Omnipaque  300 FLUOROSCOPY: Radiation Exposure Index (as provided by the fluoroscopic device): 494 mGy Kerma COMPLICATIONS: None immediate.  PROCEDURE: Informed consent was obtained from the patient following explanation of the procedure, risks, benefits  and alternatives. The patient understands, agrees and consents for the procedure. All questions were addressed. A time out was performed prior to the initiation of the procedure. Right groin was prepped and draped in sterile fashion. Maximal barrier sterile technique was utilized including caps, mask, sterile gowns, sterile gloves, sterile drape, hand hygiene and skin antiseptic. Ultrasound confirmed a patent right common femoral artery. Ultrasound image was saved for documentation. Right groin was anesthetized with 1% lidocaine . A small incision was made. Using ultrasound guidance, 21 gauge needle was directed into the right common femoral artery and a micropuncture dilator set was placed. Five French vascular sheath was placed over a Bentson wire. C2 catheter was used to cannulate the superior mesenteric artery. Selective SMA arteriogram was performed. STC microcatheter was advanced into the second order ileocolic branch. Selective angiography was performed of this artery. Microcatheter was used to select the superior and inferior ileocolic artery branches. Selective angiography was performed in each artery. Catheter was pulled back into the main ileocolic branch and additional angiography was performed. Microcatheter was used to cannulate a second order branch of the SMA along the right side of the abdomen. This appeared to be a small bowel branch likely representing ileal branches. Microcatheter and C2 catheter were removed. Oblique angiography was performed through the right groin sheath. Right groin sheath was removed using Angio-Seal closure device. Right groin hemostasis at the end of the procedure. FINDINGS: Superior mesenteric artery is patent. The initial superior mesenteric arteriogram raised concern for a small focus of contrast extravasation in the expected region of the terminal ileum.  Therefore, selective angiography was performed in the ileocolic artery and ileocolic artery branches. However, there was never re-demonstration of contrast extravasation, contrast staining, early venous filling or a vascular malformation. No evidence for contrast extravasation involving the right-sided small bowel arteries. IMPRESSION: 1. No clear evidence for active GI bleeding from the superior mesenteric artery and ileocolic branches. Embolization was not performed. Electronically Signed   By: Juliene Balder M.D.   On: 10/05/2024 22:26   IR Angiogram Selective Each Additional Vessel Result Date: 10/05/2024 INDICATION: 83 year old with acute GI bleed. CTA demonstrates bleeding at the terminal ileum. Patient had a similar area of bleeding in May 2025 and previous catheter directed coil embolization. EXAM: 1. Visceral angiography: Superior mesenteric artery 2. Visceral angiography: 2 second order branches of the superior mesenteric artery; ileocecal artery and small bowel branch 3. Visceral angiography: 2 third order branches of the superior mesenteric artery; superior ileocolic branch and inferior ileocolic branch 4. Ultrasound guidance for vascular access MEDICATIONS: Moderate sedation ANESTHESIA/SEDATION: Moderate (conscious) sedation was employed during this procedure. A total of Versed  2 mg and Fentanyl  100 mcg was administered intravenously by the radiology nurse. Total intra-service moderate Sedation Time: 32 minutes. The patient's level of consciousness and vital signs were monitored continuously by radiology nursing throughout the procedure under my direct supervision. CONTRAST:  60 mL Omnipaque  300 FLUOROSCOPY: Radiation Exposure Index (as provided by the fluoroscopic device): 494 mGy Kerma COMPLICATIONS: None immediate. PROCEDURE: Informed consent was obtained from the patient following explanation of the procedure, risks, benefits and alternatives. The patient understands, agrees and consents for the  procedure. All questions were addressed. A time out was performed prior to the initiation of the procedure. Right groin was prepped and draped in sterile fashion. Maximal barrier sterile technique was utilized including caps, mask, sterile gowns, sterile gloves, sterile drape, hand hygiene and skin antiseptic. Ultrasound confirmed a patent right common femoral artery. Ultrasound image was saved  for documentation. Right groin was anesthetized with 1% lidocaine . A small incision was made. Using ultrasound guidance, 21 gauge needle was directed into the right common femoral artery and a micropuncture dilator set was placed. Five French vascular sheath was placed over a Bentson wire. C2 catheter was used to cannulate the superior mesenteric artery. Selective SMA arteriogram was performed. STC microcatheter was advanced into the second order ileocolic branch. Selective angiography was performed of this artery. Microcatheter was used to select the superior and inferior ileocolic artery branches. Selective angiography was performed in each artery. Catheter was pulled back into the main ileocolic branch and additional angiography was performed. Microcatheter was used to cannulate a second order branch of the SMA along the right side of the abdomen. This appeared to be a small bowel branch likely representing ileal branches. Microcatheter and C2 catheter were removed. Oblique angiography was performed through the right groin sheath. Right groin sheath was removed using Angio-Seal closure device. Right groin hemostasis at the end of the procedure. FINDINGS: Superior mesenteric artery is patent. The initial superior mesenteric arteriogram raised concern for a small focus of contrast extravasation in the expected region of the terminal ileum. Therefore, selective angiography was performed in the ileocolic artery and ileocolic artery branches. However, there was never re-demonstration of contrast extravasation, contrast  staining, early venous filling or a vascular malformation. No evidence for contrast extravasation involving the right-sided small bowel arteries. IMPRESSION: 1. No clear evidence for active GI bleeding from the superior mesenteric artery and ileocolic branches. Embolization was not performed. Electronically Signed   By: Juliene Balder M.D.   On: 10/05/2024 22:26   IR Angiogram Selective Each Additional Vessel Result Date: 10/05/2024 INDICATION: 83 year old with acute GI bleed. CTA demonstrates bleeding at the terminal ileum. Patient had a similar area of bleeding in May 2025 and previous catheter directed coil embolization. EXAM: 1. Visceral angiography: Superior mesenteric artery 2. Visceral angiography: 2 second order branches of the superior mesenteric artery; ileocecal artery and small bowel branch 3. Visceral angiography: 2 third order branches of the superior mesenteric artery; superior ileocolic branch and inferior ileocolic branch 4. Ultrasound guidance for vascular access MEDICATIONS: Moderate sedation ANESTHESIA/SEDATION: Moderate (conscious) sedation was employed during this procedure. A total of Versed  2 mg and Fentanyl  100 mcg was administered intravenously by the radiology nurse. Total intra-service moderate Sedation Time: 32 minutes. The patient's level of consciousness and vital signs were monitored continuously by radiology nursing throughout the procedure under my direct supervision. CONTRAST:  60 mL Omnipaque  300 FLUOROSCOPY: Radiation Exposure Index (as provided by the fluoroscopic device): 494 mGy Kerma COMPLICATIONS: None immediate. PROCEDURE: Informed consent was obtained from the patient following explanation of the procedure, risks, benefits and alternatives. The patient understands, agrees and consents for the procedure. All questions were addressed. A time out was performed prior to the initiation of the procedure. Right groin was prepped and draped in sterile fashion. Maximal barrier  sterile technique was utilized including caps, mask, sterile gowns, sterile gloves, sterile drape, hand hygiene and skin antiseptic. Ultrasound confirmed a patent right common femoral artery. Ultrasound image was saved for documentation. Right groin was anesthetized with 1% lidocaine . A small incision was made. Using ultrasound guidance, 21 gauge needle was directed into the right common femoral artery and a micropuncture dilator set was placed. Five French vascular sheath was placed over a Bentson wire. C2 catheter was used to cannulate the superior mesenteric artery. Selective SMA arteriogram was performed. STC microcatheter was advanced into the second order  ileocolic branch. Selective angiography was performed of this artery. Microcatheter was used to select the superior and inferior ileocolic artery branches. Selective angiography was performed in each artery. Catheter was pulled back into the main ileocolic branch and additional angiography was performed. Microcatheter was used to cannulate a second order branch of the SMA along the right side of the abdomen. This appeared to be a small bowel branch likely representing ileal branches. Microcatheter and C2 catheter were removed. Oblique angiography was performed through the right groin sheath. Right groin sheath was removed using Angio-Seal closure device. Right groin hemostasis at the end of the procedure. FINDINGS: Superior mesenteric artery is patent. The initial superior mesenteric arteriogram raised concern for a small focus of contrast extravasation in the expected region of the terminal ileum. Therefore, selective angiography was performed in the ileocolic artery and ileocolic artery branches. However, there was never re-demonstration of contrast extravasation, contrast staining, early venous filling or a vascular malformation. No evidence for contrast extravasation involving the right-sided small bowel arteries. IMPRESSION: 1. No clear evidence for  active GI bleeding from the superior mesenteric artery and ileocolic branches. Embolization was not performed. Electronically Signed   By: Juliene Balder M.D.   On: 10/05/2024 22:26   CT ANGIO GI BLEED Result Date: 10/05/2024 CLINICAL DATA:  Lower GI bleed. Pseudoaneurysm in the terminal ileum. EXAM: CTA ABDOMEN AND PELVIS WITHOUT AND WITH CONTRAST TECHNIQUE: Multidetector CT imaging of the abdomen and pelvis was performed using the standard protocol during bolus administration of intravenous contrast. Multiplanar reconstructed images and MIPs were obtained and reviewed to evaluate the vascular anatomy. RADIATION DOSE REDUCTION: This exam was performed according to the departmental dose-optimization program which includes automated exposure control, adjustment of the mA and/or kV according to patient size and/or use of iterative reconstruction technique. CONTRAST:  OMNIPAQUE  IOHEXOL  350 MG/ML SOLN COMPARISON:  CT abdomen pelvis dated 04/06/2024. FINDINGS: VASCULAR Aorta: Mild atherosclerotic calcification of the abdominal aorta. No dilatation or dissection. No periaortic fluid collection. Celiac: Patent without evidence of aneurysm, dissection, vasculitis or significant stenosis. SMA: Patent without evidence of aneurysm, dissection, vasculitis or significant stenosis. Renals: Both renal arteries are patent without evidence of aneurysm, dissection, vasculitis, fibromuscular dysplasia or significant stenosis. IMA: Patent without evidence of aneurysm, dissection, vasculitis or significant stenosis. Inflow: Mild atherosclerotic calcification of the iliac arteries. No aneurysmal dilatation or dissection. The iliac arteries are patent. Proximal Outflow: The visualized proximal fluid patent. Veins: The IVC is unremarkable. The SMV, splenic vein, and main portal vein are patent. No portal venous gas. Review of the MIP images confirms the above findings. NON-VASCULAR Lower chest: The visualized lung bases are clear. No  intra-abdominal free air or free fluid. Hepatobiliary: No focal liver abnormality is seen. No gallstones, gallbladder wall thickening, or biliary dilatation. Pancreas: Unremarkable. No pancreatic ductal dilatation or surrounding inflammatory changes. Spleen: Normal in size without focal abnormality. Adrenals/Urinary Tract: The adrenal glands unremarkable. Bilateral renal parenchyma atrophy and cortical scarring. There is an 8 mm by stone in the upper pole of the left kidney. A 2 mm stone in the interpolar right kidney. No hydronephrosis on either side. Small bilateral renal cysts. The visualized ureters and urinary bladder appear unremarkable. Stomach/Bowel: Postsurgical changes of the rectosigmoid with anastomotic staple line. There is colonic diverticulosis. There is no bowel obstruction or active inflammation. Progressive contrast pooling in the terminal ileum on postcontrast images consistent with active GI bleed. The appendix is normal. Lymphatic: No adenopathy. Reproductive: Hysterectomy.  No suspicious adnexal masses. Other:  None Musculoskeletal: Degenerative changes of the spine and osteopenia. No acute osseous pathology. IMPRESSION: 1. Active GI bleed in the terminal ileum. 2. Colonic diverticulosis. No bowel obstruction. Normal appendix. 3. Bilateral nonobstructing renal calculi. No hydronephrosis. 4.  Aortic Atherosclerosis (ICD10-I70.0). Electronically Signed   By: Vanetta Chou M.D.   On: 10/05/2024 16:45        Scheduled Meds:  sodium chloride    Intravenous Once   Chlorhexidine  Gluconate Cloth  6 each Topical QHS   insulin  aspart  0-6 Units Subcutaneous Q4H   Continuous Infusions:  dextrose  5 % and 0.9 % NaCl 75 mL/hr at 10/07/24 0443     LOS: 2 days    Time spent: 48 minutes spent on 10/07/2024 caring for this patient face-to-face including chart review, ordering labs/tests, documenting, discussion with nursing staff, consultants, updating family and interview/physical  exam    Camellia PARAS Hollie Wojahn, DO [facility] Available via Epic secure chat 7am-7pm After these hours, please refer to coverage provider listed on [email or web address] 10/07/2024, 10:23 AM

## 2024-10-07 NOTE — Progress Notes (Addendum)
 [facility] Gastroenterology Progress Note  Stephanie Barber 83 y.o. [birth year]   Subjective: Resting in bed. Denies abdominal pain. No rectal bleeding today or overnight. Husband and nurse in room.  Objective: Vital signs: Vitals:   10/07/24 1100 10/07/24 1220  BP:    Pulse: 86   Resp: (!) 26   Temp:  98.6 F (37 C)  SpO2: 98%   BP 142/76   Physical Exam: Gen: lethargic, elderly, thin, no acute distress  HEENT: anicteric sclera CV: RRR Chest: CTA B Abd: diffuse tenderness with guarding, soft, nondistended, +BS Ext: no edema  Lab Results: Recent Labs    10/06/24 0203 10/07/24 0324  NA 142 141  K 4.0 4.1  CL 108 111  CO2 19* 21*  GLUCOSE 189* 100*  BUN 22 21  CREATININE 1.25* 1.34*  CALCIUM  9.0 8.8*  MG 1.7  --    Recent Labs    10/05/24 1129  AST 34  ALT 16  ALKPHOS 80  BILITOT 0.3  PROT 7.4  ALBUMIN 4.1   Recent Labs    10/06/24 0203 10/06/24 0954 10/07/24 0640 10/07/24 1309  WBC 8.1  --  3.4*  --   HGB 10.3*   < > 8.8* 8.6*  HCT 32.4*   < > 27.7* 26.7*  MCV 93.9  --  92.0  --   PLT 274  --  217  --    < > = values in this interval not displayed.      Assessment/Plan: GI bleed likely diverticular in origin that seems to have resolved.  Hemoglobin 8.6 but no signs of further bleeding.  Advance diet.  No plans for colonoscopy.  Hopefully can go home in the next 1 to 2 days if no further bleeding due to the upcoming holiday. Will sign off. Call if questions.   Stephanie Barber 10/07/2024, 2:33 PM  Questions please call [phone] ID: Stephanie Barber, female   DOB: 12/19/1940, 83 y.o.   MRN: [id number]

## 2024-10-07 NOTE — Progress Notes (Signed)
 Patient continues to complain of chest pain, on call providers aware, was reported to this nurse that patient had complained earlier to another nurse and provider notified and EKG done, patient was also given tylenol  at 2226 and Maalox and Lidocaine  at 2235, awaiting new orders.

## 2024-10-08 DIAGNOSIS — I10 Essential (primary) hypertension: Secondary | ICD-10-CM | POA: Diagnosis not present

## 2024-10-08 DIAGNOSIS — E785 Hyperlipidemia, unspecified: Secondary | ICD-10-CM | POA: Diagnosis not present

## 2024-10-08 DIAGNOSIS — D649 Anemia, unspecified: Secondary | ICD-10-CM

## 2024-10-08 DIAGNOSIS — Z8639 Personal history of other endocrine, nutritional and metabolic disease: Secondary | ICD-10-CM | POA: Diagnosis not present

## 2024-10-08 DIAGNOSIS — K922 Gastrointestinal hemorrhage, unspecified: Secondary | ICD-10-CM | POA: Diagnosis not present

## 2024-10-08 LAB — GLUCOSE, CAPILLARY
Glucose-Capillary: 150 mg/dL — ABNORMAL HIGH (ref 70–99)
Glucose-Capillary: 115 mg/dL — ABNORMAL HIGH (ref 70–99)
Glucose-Capillary: 122 mg/dL — ABNORMAL HIGH (ref 70–99)
Glucose-Capillary: 109 mg/dL — ABNORMAL HIGH (ref 70–99)
Glucose-Capillary: 116 mg/dL — ABNORMAL HIGH (ref 70–99)

## 2024-10-08 LAB — BASIC METABOLIC PANEL WITH GFR
Anion gap: 10 (ref 5–15)
BUN: 17 mg/dL (ref 8–23)
CO2: 20 mmol/L — ABNORMAL LOW (ref 22–32)
Calcium: 8.7 mg/dL — ABNORMAL LOW (ref 8.9–10.3)
Chloride: 112 mmol/L — ABNORMAL HIGH (ref 98–111)
Creatinine, Ser: 1.33 mg/dL — ABNORMAL HIGH (ref 0.44–1.00)
GFR, Estimated: 39 mL/min — ABNORMAL LOW (ref >60)
Glucose, Bld: 145 mg/dL — ABNORMAL HIGH (ref 70–99)
Potassium: 4.2 mmol/L (ref 3.5–5.1)
Sodium: 142 mmol/L (ref 135–145)

## 2024-10-08 LAB — CBC
HCT: 28.2 % — ABNORMAL LOW (ref 36.0–46.0)
Hemoglobin: 8.8 g/dL — ABNORMAL LOW (ref 12.0–15.0)
MCH: 29.6 pg (ref 26.0–34.0)
MCHC: 31.2 g/dL (ref 30.0–36.0)
MCV: 94.9 fL (ref 80.0–100.0)
Platelets: 201 K/uL (ref 150–400)
RBC: 2.97 MIL/uL — ABNORMAL LOW (ref 3.87–5.11)
RDW: 13.9 % (ref 11.5–15.5)
WBC: 4.8 K/uL (ref 4.0–10.5)
nRBC: 0 % (ref 0.0–0.2)

## 2024-10-08 LAB — HEMOGLOBIN AND HEMATOCRIT, BLOOD
HCT: 25.1 % — ABNORMAL LOW (ref 36.0–46.0)
HCT: 24.8 % — ABNORMAL LOW (ref 36.0–46.0)
Hemoglobin: 8.2 g/dL — ABNORMAL LOW (ref 12.0–15.0)
Hemoglobin: 7.8 g/dL — ABNORMAL LOW (ref 12.0–15.0)

## 2024-10-08 MED ORDER — PANTOPRAZOLE SODIUM 40 MG PO TBEC
40.0000 mg | DELAYED_RELEASE_TABLET | Freq: Every day | ORAL | Status: DC
  Administered 2024-10-08 – 2024-10-12 (×5): 40 mg via ORAL
  Filled 2024-10-08 (×5): qty 1

## 2024-10-08 NOTE — Progress Notes (Signed)
 [facility]                                                                               Stephanie Barber, is a 83 y.o. female, DOB - 30-Sep-1941, FMW:[id number] Admit date - 10/05/2024    Outpatient Primary MD for the patient is Sun, Vyvyan, MD  LOS - 3  days    Brief summary   Stephanie Barber is a 83 y.o. 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  Onset night prior.  Started having abdominal cramping pain throughout the day with reported bright red blood per rectum.  Denies any antiplatelet, anticoagulant use.  Denies NSAID use.   In the ED, temperature 97.9 F, HR 62, RR 18, BP 124/84, SpO2 99% on 2 L nasal cannula.  WBC 3.5, hemoglobin 11.4, platelet count 264.  Sodium 141, potassium 4.8, chloride 107, CO2 23, glucose 65, BUN 23, creat 1.57.  AST 34, ALT 16, total bilirubin 0.3.  FOBT positive.  Interventional radiology consulted for angiogram.  GI consulted.  TRH consulted for admission for further evaluation management of lower GI bleed   Assessment & Plan    Assessment and Plan:    Lower GI bleed  suspect mostly from terminal ileum History of colonic pseudoaneurysm s/p embolization. Patient presents with bright red blood per rectum.  Patient has a baseline hemoglobin around 11 currently at 8.8. [facility] on board and following the patient. Transfuse to keep hemoglobin greater than 7. Patient had another episode of bloody bowel movement earlier this morning. CT angiogram GI bleed study initially positive for active bleeding terminal ileum. Interventional radiology was consulted and patient underwent IR visceral angiogram with no clear evidence for active GI bleeding from the superior mesenteric artery and ileocolic branches, and no embolization was performed. Shortly following admission, patient's  heart rate and blood pressure dropped transiently with repeat hemoglobin thereafter 10.4. Repeat CT angiogram abdomen/pelvis was then performed with no acute findings to explain lower GI bleeding, noted postsurgical changes in the sigmoid and prior embolotherapy changes in the right lower quadrant.  Continue with Protonix  40 mg daily Awaiting recommendations from GI.     Bradycardia and hypotension suspect from vasovagal event Appears to have resolved with IV fluid bolus.    Type 2 diabetes mellitus Patient's hemoglobin A1c is 6.4 Continue sliding scale insulin  for coverage   Essential hypertension Blood pressure parameters have improved. Her metoprolol  and Norvasc  have been held for transient bradycardia and hypotension. Continue to monitor   Hyperlipidemia Holding the statins for now   Stage IIIb CKD Baseline creatinine between 1.2-1.6 Creatinine been stable around 1.3   Estimated body mass index is 24.31 kg/m as calculated from the following:   Height as of this encounter: 4' 11 (1.499 m).   Weight as of this encounter: 54.6 kg.  Code Status: full code.  DVT Prophylaxis:  SCDs Start: 10/05/24 1754   Level of Care: Level of care: Stepdown Family Communication: family  at bedside.   Disposition Plan:  Remains inpatient appropriate:  pending further work up for GI bleed.    Procedures:  CT angiogram of the abdomen and pelvis  Consultants:   Gastroenterology  Antimicrobials:   Anti-infectives (From admission, onward)    None        Medications  Scheduled Meds:  sodium chloride    Intravenous Once   Chlorhexidine  Gluconate Cloth  6 each Topical QHS   insulin  aspart  0-6 Units Subcutaneous Q4H   melatonin  6 mg Oral QHS   pantoprazole   40 mg Oral Q0600   Continuous Infusions: PRN Meds:.acetaminophen  **OR** acetaminophen , albuterol , hydrALAZINE , ondansetron  **OR** ondansetron  (ZOFRAN ) IV, mouth rinse    Subjective:   Stephanie Barber was  seen and examined today.  Large Bloody bowel movement this morning.   Objective:   Vitals:   10/08/24 0700 10/08/24 0800 10/08/24 0900 10/08/24 1000  BP: (!) 112/51 (!) 130/53 (!) 136/59 (!) 131/54  Pulse: 82 87 (!) 103 97  Resp: 14 15 13 17   Temp:  98.6 F (37 C)    TempSrc:  Oral    SpO2: 95% 97% 97% 96%  Weight:      Height:        Intake/Output Summary (Last 24 hours) at 10/08/2024 1207 Last data filed at 10/08/2024 0800 Gross per 24 hour  Intake 926.12 ml  Output 1450 ml  Net -523.88 ml   Filed Weights   10/05/24 1920  Weight: 54.6 kg     Exam General exam: Appears calm and comfortable  Respiratory system: Clear to auscultation. Respiratory effort normal. Cardiovascular system: S1 & S2 heard, RRR. No JVD, Gastrointestinal system: Abdomen is nondistended, soft and nontender. Central nervous system: Alert and oriented. No focal neurological deficits. Extremities: Symmetric 5 x 5 power. Skin: No rashes, lesions or ulcers Psychiatry:  Mood & affect appropriate.    Data Reviewed:  I have personally reviewed following labs and imaging studies   CBC Lab Results  Component Value Date   WBC 4.8 10/08/2024   RBC 2.97 (L) 10/08/2024   HGB 8.8 (L) 10/08/2024   HCT 28.2 (L) 10/08/2024   MCV 94.9 10/08/2024   MCH 29.6 10/08/2024   PLT 201 10/08/2024   MCHC 31.2 10/08/2024   RDW 13.9 10/08/2024   LYMPHSABS 1.1 04/05/2024   MONOABS 0.4 04/05/2024   EOSABS 0.1 04/05/2024   BASOSABS 0.0 04/05/2024     Last metabolic panel Lab Results  Component Value Date   NA 142 10/08/2024   K 4.2 10/08/2024   CL 112 (H) 10/08/2024   CO2 20 (L) 10/08/2024   BUN 17 10/08/2024   CREATININE 1.33 (H) 10/08/2024   GLUCOSE 145 (H) 10/08/2024   GFRNONAA 39 (L) 10/08/2024   GFRAA 50 (L) 06/03/2019   CALCIUM  8.7 (L) 10/08/2024   PHOS 3.4 05/06/2017   PROT 7.4 10/05/2024   ALBUMIN 4.1 10/05/2024   BILITOT 0.3 10/05/2024   ALKPHOS 80 10/05/2024   AST 34 10/05/2024   ALT 16  10/05/2024   ANIONGAP 10 10/08/2024    CBG (last 3)  Recent Labs    10/08/24 0306 10/08/24 0741 10/08/24 1117  GLUCAP 150* 115* 122*      Coagulation Profile: No results for input(s): INR, PROTIME in the last 168 hours.   Radiology Studies: No results found.     Elgie Butter M.D. [facility] 10/08/2024, 12:07 PM  Available via Epic secure chat 7am-7pm After 7 pm, please refer to night coverage provider listed on amion.

## 2024-10-08 NOTE — Plan of Care (Signed)
  Problem: Education: Goal: Ability to describe self-care measures that may prevent or decrease complications (Diabetes Survival Skills Education) will improve Outcome: Progressing   Problem: Coping: Goal: Ability to adjust to condition or change in health will improve Outcome: Progressing   Problem: Fluid Volume: Goal: Ability to maintain a balanced intake and output will improve Outcome: Progressing   Problem: Health Behavior/Discharge Planning: Goal: Ability to manage health-related needs will improve Outcome: Progressing   Problem: Nutritional: Goal: Maintenance of adequate nutrition will improve Outcome: Progressing

## 2024-10-08 NOTE — Plan of Care (Signed)
 Plane of care and goals reviewed with time given for questions, patient handbook/guide at bedside. Bed in lowest locked position with side rails up, call bell in reach, bed alarm on, mat on floor and bedside table in reach. Ivf infusing well, all lines flushed.  Problem: Education: Goal: Ability to describe self-care measures that may prevent or decrease complications (Diabetes Survival Skills Education) will improve Outcome: Progressing Goal: Individualized Educational Video(s) Outcome: Progressing   Problem: Fluid Volume: Goal: Ability to maintain a balanced intake and output will improve Outcome: Progressing   Problem: Skin Integrity: Goal: Risk for impaired skin integrity will decrease Outcome: Progressing   Problem: Education: Goal: Knowledge of General Education information will improve Description: Including pain rating scale, medication(s)/side effects and non-pharmacologic comfort measures Outcome: Progressing

## 2024-10-08 NOTE — Progress Notes (Signed)
 Patient given 12.5 mcg IV Fentanyl  for C/O chest pain, also 2 liters of oxygen via nasal cannula placed, patient resting with her eyes closed, able to see chest rise and fall without any distress noted, see chart for vitals, husband at bedside.

## 2024-10-08 NOTE — Plan of Care (Signed)
  Problem: Fluid Volume: Goal: Ability to maintain a balanced intake and output will improve Outcome: Progressing   Problem: Metabolic: Goal: Ability to maintain appropriate glucose levels will improve Outcome: Progressing   Problem: Nutritional: Goal: Maintenance of adequate nutrition will improve Outcome: Progressing Goal: Progress toward achieving an optimal weight will improve Outcome: Progressing   Problem: Clinical Measurements: Goal: Ability to maintain clinical measurements within normal limits will improve Outcome: Progressing Goal: Will remain free from infection Outcome: Progressing Goal: Diagnostic test results will improve Outcome: Progressing Goal: Respiratory complications will improve Outcome: Progressing Goal: Cardiovascular complication will be avoided Outcome: Progressing

## 2024-10-08 NOTE — Progress Notes (Signed)
 [facility] Gastroenterology Progress Note  Stephanie Barber 83 y.o. 06/10/1941   Subjective: Rectal bleeding with streaks of black and red in stool this morning.  Small amount of dark black stool this afternoon per nursing.  Denies abdominal pain.  Was up earlier playing cards with her husband.  Objective: Vital signs: Vitals:   10/08/24 1539 10/08/24 1600  BP:  (!) 145/44  Pulse:  (!) 112  Resp:  16  Temp: 99.3 F (37.4 C)   SpO2:  95%    Physical Exam: Gen: Lethargic, elderly, well-nourished, pleasant, no acute distress  HEENT: anicteric sclera CV: RRR Chest: CTA B Abd: Soft nontender nondistended positive bowel sounds Ext: no edema  Lab Results: Recent Labs    10/06/24 0203 10/07/24 0324 10/08/24 0349  NA 142 141 142  K 4.0 4.1 4.2  CL 108 111 112*  CO2 19* 21* 20*  GLUCOSE 189* 100* 145*  BUN 22 21 17   CREATININE 1.25* 1.34* 1.33*  CALCIUM  9.0 8.8* 8.7*  MG 1.7  --   --    No results for input(s): AST, ALT, ALKPHOS, BILITOT, PROT, ALBUMIN in the last 72 hours. Recent Labs    10/07/24 0640 10/07/24 1309 10/08/24 0349 10/08/24 1212  WBC 3.4*  --  4.8  --   HGB 8.8*   < > 8.8* 8.2*  HCT 27.7*   < > 28.2* 25.1*  MCV 92.0  --  94.9  --   PLT 217  --  201  --    < > = values in this interval not displayed.      Assessment/Plan: Painless hematochezia likely diverticular in origin that seems to be resolving.  Hemoglobin 8.2.  Tolerating diet.  Ok to resume carb modified diet.  Conservative management.  If hemoglobin stable and no further bleeding ok to go home tomorrow.   Stephanie Barber 10/08/2024, 4:33 PM  Questions please call [phone] ID: Stephanie Barber, female   DOB: July 05, 1941, 83 y.o.   MRN: [id number]

## 2024-10-09 DIAGNOSIS — I10 Essential (primary) hypertension: Secondary | ICD-10-CM | POA: Diagnosis not present

## 2024-10-09 DIAGNOSIS — K922 Gastrointestinal hemorrhage, unspecified: Secondary | ICD-10-CM | POA: Diagnosis not present

## 2024-10-09 DIAGNOSIS — E785 Hyperlipidemia, unspecified: Secondary | ICD-10-CM | POA: Diagnosis not present

## 2024-10-09 DIAGNOSIS — Z8639 Personal history of other endocrine, nutritional and metabolic disease: Secondary | ICD-10-CM | POA: Diagnosis not present

## 2024-10-09 LAB — CBC WITH DIFFERENTIAL/PLATELET
Abs Immature Granulocytes: 0.02 K/uL (ref 0.00–0.07)
Basophils Absolute: 0 K/uL (ref 0.0–0.1)
Basophils Relative: 0 %
Eosinophils Absolute: 0.1 K/uL (ref 0.0–0.5)
Eosinophils Relative: 3 %
HCT: 23.7 % — ABNORMAL LOW (ref 36.0–46.0)
Hemoglobin: 7.5 g/dL — ABNORMAL LOW (ref 12.0–15.0)
Immature Granulocytes: 1 %
Lymphocytes Relative: 21 %
Lymphs Abs: 0.8 K/uL (ref 0.7–4.0)
MCH: 29.9 pg (ref 26.0–34.0)
MCHC: 31.6 g/dL (ref 30.0–36.0)
MCV: 94.4 fL (ref 80.0–100.0)
Monocytes Absolute: 0.4 K/uL (ref 0.1–1.0)
Monocytes Relative: 11 %
Neutro Abs: 2.4 K/uL (ref 1.7–7.7)
Neutrophils Relative %: 64 %
Platelets: 186 K/uL (ref 150–400)
RBC: 2.51 MIL/uL — ABNORMAL LOW (ref 3.87–5.11)
RDW: 13.8 % (ref 11.5–15.5)
WBC: 3.7 K/uL — ABNORMAL LOW (ref 4.0–10.5)
nRBC: 0 % (ref 0.0–0.2)

## 2024-10-09 LAB — BPAM RBC
Blood Product Expiration Date: 202512262359
Blood Product Expiration Date: 202512262359
Unit Type and Rh: 5100
Unit Type and Rh: 5100

## 2024-10-09 LAB — TYPE AND SCREEN
ABO/RH(D): O POS
Antibody Screen: NEGATIVE
Unit division: 0
Unit division: 0

## 2024-10-09 LAB — BASIC METABOLIC PANEL WITH GFR
Anion gap: 8 (ref 5–15)
BUN: 22 mg/dL (ref 8–23)
CO2: 22 mmol/L (ref 22–32)
Calcium: 9.4 mg/dL (ref 8.9–10.3)
Chloride: 113 mmol/L — ABNORMAL HIGH (ref 98–111)
Creatinine, Ser: 1.43 mg/dL — ABNORMAL HIGH (ref 0.44–1.00)
GFR, Estimated: 36 mL/min — ABNORMAL LOW (ref >60)
Glucose, Bld: 106 mg/dL — ABNORMAL HIGH (ref 70–99)
Potassium: 4.2 mmol/L (ref 3.5–5.1)
Sodium: 143 mmol/L (ref 135–145)

## 2024-10-09 LAB — GLUCOSE, CAPILLARY
Glucose-Capillary: 107 mg/dL — ABNORMAL HIGH (ref 70–99)
Glucose-Capillary: 115 mg/dL — ABNORMAL HIGH (ref 70–99)
Glucose-Capillary: 170 mg/dL — ABNORMAL HIGH (ref 70–99)
Glucose-Capillary: 87 mg/dL (ref 70–99)
Glucose-Capillary: 97 mg/dL (ref 70–99)
Glucose-Capillary: 98 mg/dL (ref 70–99)

## 2024-10-09 LAB — HEMOGLOBIN AND HEMATOCRIT, BLOOD
HCT: 29.7 % — ABNORMAL LOW (ref 36.0–46.0)
Hemoglobin: 9.7 g/dL — ABNORMAL LOW (ref 12.0–15.0)

## 2024-10-09 LAB — PREPARE RBC (CROSSMATCH)

## 2024-10-09 MED ORDER — SODIUM CHLORIDE 0.9% IV SOLUTION: Freq: Once | INTRAVENOUS | Status: AC

## 2024-10-09 NOTE — H&P (View-Only) (Signed)
 [facility] Gastroenterology Progress Note  Stephanie Barber 83 y.o. [birth year]   Subjective: Stool this morning mostly dark brown with dark red streaks per nursing.  Denies abdominal pain.  Husband at bedside and daughter on phone. Objective: Vital signs: Vitals:   10/09/24 1114 10/09/24 1136  BP: (!) 159/67 (!) 147/53  Pulse: 72 71  Resp: (!) 33 17  Temp: 98.3 F (36.8 C) 98.3 F (36.8 C)  SpO2: 95%     Physical Exam: Gen: Lethargic, elderly, thin, pleasant no acute distress  HEENT: anicteric sclera CV: RRR Chest: CTA B Abd: Soft nontender nondistended positive bowel sounds Ext: no edema  Lab Results: Recent Labs    10/08/24 0349 10/09/24 0338  NA 142 143  K 4.2 4.2  CL 112* 113*  CO2 20* 22  GLUCOSE 145* 106*  BUN 17 22  CREATININE 1.33* 1.43*  CALCIUM  8.7* 9.4   No results for input(s): AST, ALT, ALKPHOS, BILITOT, PROT, ALBUMIN in the last 72 hours. Recent Labs    10/08/24 0349 10/08/24 1212 10/08/24 2032 10/09/24 0338  WBC 4.8  --   --  3.7*  NEUTROABS  --   --   --  2.4  HGB 8.8*   < > 7.8* 7.5*  HCT 28.2*   < > 24.8* 23.7*  MCV 94.9  --   --  94.4  PLT 201  --   --  186   < > = values in this interval not displayed.      Assessment/Plan: Obscure overt GI bleeding-initial CTA positive for bleeding in terminal ileum that was not seen on subsequent angiogram by IR and therefore embolization not performed.  Due to continued drop in her hemoglobin with recurrent black and red stool would do an EGD and if that is unrevealing we will deploy a capsule to evaluate her small intestine.  If active bleeding is seen in the terminal ileum then she may need an updated colonoscopy if the bleeding is close to the ileocecal valve otherwise she might need a double-balloon enteroscopy at a tertiary care center if she continues to show signs of bleeding.  Carb modified diet.  NPO after midnight.  Supportive care.   [name] 10/09/2024, 11:41  AM  Questions please call [phone] ID: Stephanie Barber, female   DOB: 09-22-1941, 83 y.o.   MRN: [id number]

## 2024-10-09 NOTE — Progress Notes (Addendum)
 [facility]                                                                               Stephanie Barber, is a 83 y.o. female, DOB - May 01, 1941, FMW:[id number] Admit date - 10/05/2024    Outpatient Primary MD for the patient is Sun, Vyvyan, MD  LOS - 4  days    Brief summary   Stephanie Barber is a 83 y.o. 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  Onset night prior.  Started having abdominal cramping pain throughout the day with reported bright red blood per rectum.  Denies any antiplatelet, anticoagulant use.  Denies NSAID use.   In the ED, temperature 97.9 F, HR 62, RR 18, BP 124/84, SpO2 99% on 2 L nasal cannula.  WBC 3.5, hemoglobin 11.4, platelet count 264.  Sodium 141, potassium 4.8, chloride 107, CO2 23, glucose 65, BUN 23, creat 1.57.  AST 34, ALT 16, total bilirubin 0.3.  FOBT positive.  Interventional radiology consulted for angiogram.  GI consulted.  TRH consulted for admission for further evaluation management of lower GI bleed   Assessment & Plan    Assessment and Plan:    Lower GI bleed  suspect mostly from terminal ileum History of colonic pseudoaneurysm s/p embolization. Patient presents with bright red blood per rectum.  Patient has a baseline hemoglobin around 11 slowly dropping to 7.5 TODAY.  [facility] on board and following the patient. Patient had another episode of bloody bowel movement earlier this morning. CT angiogram GI bleed study initially positive for active bleeding terminal ileum. Interventional radiology was consulted and patient underwent IR visceral angiogram with no clear evidence for active GI bleeding from the superior mesenteric artery and ileocolic branches, and no embolization was performed. Shortly following admission, patient's heart rate and blood pressure  dropped transiently with repeat hemoglobin thereafter 10.4. Repeat CT angiogram abdomen/pelvis was then performed with no acute findings to explain lower GI bleeding, noted postsurgical changes in the sigmoid and prior embolotherapy changes in the right lower quadrant.  Continue with Protonix  40 mg daily Plan for EGD in am and possible capsule endoscopy.  NPO after midnight.   Anemia of blood loss:  From GI bleed.  1 unit of prbc transfusion ordered.      Bradycardia and hypotension suspect from vasovagal event Appears to have resolved with IV fluid bolus.    Type 2 diabetes mellitus Patient's hemoglobin A1c is 6.4 Continue sliding scale insulin  for coverage. CBG (last 3)  Recent Labs    10/09/24 0318 10/09/24 0812 10/09/24 1201  GLUCAP 115* 97 107*      Essential hypertension Blood pressure parameters are optimal.  Her metoprolol  and Norvasc  have been held for transient bradycardia and hypotension. Continue to monitor   Hyperlipidemia Holding the statins for now   Stage IIIb CKD Baseline creatinine between 1.2-1.6 Creatinine been stable around 1.3   Estimated body mass index is 24.31 kg/m as calculated from the following:   Height as of this encounter: 4' 11 (  1.499 m).   Weight as of this encounter: 54.6 kg.  Code Status: full code.  DVT Prophylaxis:  SCDs Start: 10/05/24 1754   Level of Care: Level of care: Stepdown Family Communication: family  at bedside.   Disposition Plan:     Remains inpatient appropriate:  pending further work up for GI bleed.    Procedures:  CT angiogram of the abdomen and pelvis  Consultants:   Gastroenterology  Antimicrobials:   Anti-infectives (From admission, onward)    None        Medications  Scheduled Meds:  sodium chloride    Intravenous Once   Chlorhexidine  Gluconate Cloth  6 each Topical QHS   insulin  aspart  0-6 Units Subcutaneous Q4H   melatonin  6 mg Oral QHS   pantoprazole   40 mg Oral Q0600    Continuous Infusions: PRN Meds:.acetaminophen  **OR** acetaminophen , albuterol , hydrALAZINE , ondansetron  **OR** ondansetron  (ZOFRAN ) IV, mouth rinse    Subjective:   Stephanie Barber was seen and examined today.  2 small bowel movements int he last 24 hours with some bleeding.   Objective:   Vitals:   10/09/24 0600 10/09/24 0800 10/09/24 1114 10/09/24 1136  BP: (!) 173/61  (!) 159/67 (!) 147/53  Pulse: (!) 104  72 71  Resp: 17  (!) 33 17  Temp:  98.1 F (36.7 C) 98.3 F (36.8 C) 98.3 F (36.8 C)  TempSrc:  Oral Oral   SpO2: 96%  95%   Weight:      Height:        Intake/Output Summary (Last 24 hours) at 10/09/2024 1206 Last data filed at 10/08/2024 1838 Gross per 24 hour  Intake --  Output 150 ml  Net -150 ml   Filed Weights   10/05/24 1920  Weight: 54.6 kg     Exam General exam: Appears calm and comfortable  Respiratory system: Clear to auscultation. Respiratory effort normal. Cardiovascular system: S1 & S2 heard, RRR. No JVD Gastrointestinal system: Abdomen is nondistended, soft and nontender. Central nervous system: Alert and oriented. No focal neurological deficits. Extremities: Symmetric 5 x 5 power. Skin: No rashes, Psychiatry: Mood & affect appropriate.     Data Reviewed:  I have personally reviewed following labs and imaging studies   CBC Lab Results  Component Value Date   WBC 3.7 (L) 10/09/2024   RBC 2.51 (L) 10/09/2024   HGB 7.5 (L) 10/09/2024   HCT 23.7 (L) 10/09/2024   MCV 94.4 10/09/2024   MCH 29.9 10/09/2024   PLT 186 10/09/2024   MCHC 31.6 10/09/2024   RDW 13.8 10/09/2024   LYMPHSABS 0.8 10/09/2024   MONOABS 0.4 10/09/2024   EOSABS 0.1 10/09/2024   BASOSABS 0.0 10/09/2024     Last metabolic panel Lab Results  Component Value Date   NA 143 10/09/2024   K 4.2 10/09/2024   CL 113 (H) 10/09/2024   CO2 22 10/09/2024   BUN 22 10/09/2024   CREATININE 1.43 (H) 10/09/2024   GLUCOSE 106 (H) 10/09/2024   GFRNONAA 36 (L)  10/09/2024   GFRAA 50 (L) 06/03/2019   CALCIUM  9.4 10/09/2024   PHOS 3.4 05/06/2017   PROT 7.4 10/05/2024   ALBUMIN 4.1 10/05/2024   BILITOT 0.3 10/05/2024   ALKPHOS 80 10/05/2024   AST 34 10/05/2024   ALT 16 10/05/2024   ANIONGAP 8 10/09/2024    CBG (last 3)  Recent Labs    10/09/24 0318 10/09/24 0812 10/09/24 1201  GLUCAP 115* 97 107*      Coagulation Profile:  No results for input(s): INR, PROTIME in the last 168 hours.   Radiology Studies: No results found.     Elgie Butter M.D. [facility] 10/09/2024, 12:06 PM  Available via Epic secure chat 7am-7pm After 7 pm, please refer to night coverage provider listed on amion.

## 2024-10-09 NOTE — Progress Notes (Signed)
 [facility] Gastroenterology Progress Note  Stephanie Barber 83 y.o. [birth year]   Subjective: Stool this morning mostly dark brown with dark red streaks per nursing.  Denies abdominal pain.  Husband at bedside and daughter on phone. Objective: Vital signs: Vitals:   10/09/24 1114 10/09/24 1136  BP: (!) 159/67 (!) 147/53  Pulse: 72 71  Resp: (!) 33 17  Temp: 98.3 F (36.8 C) 98.3 F (36.8 C)  SpO2: 95%     Physical Exam: Gen: Lethargic, elderly, thin, pleasant no acute distress  HEENT: anicteric sclera CV: RRR Chest: CTA B Abd: Soft nontender nondistended positive bowel sounds Ext: no edema  Lab Results: Recent Labs    10/08/24 0349 10/09/24 0338  NA 142 143  K 4.2 4.2  CL 112* 113*  CO2 20* 22  GLUCOSE 145* 106*  BUN 17 22  CREATININE 1.33* 1.43*  CALCIUM  8.7* 9.4   No results for input(s): AST, ALT, ALKPHOS, BILITOT, PROT, ALBUMIN in the last 72 hours. Recent Labs    10/08/24 0349 10/08/24 1212 10/08/24 2032 10/09/24 0338  WBC 4.8  --   --  3.7*  NEUTROABS  --   --   --  2.4  HGB 8.8*   < > 7.8* 7.5*  HCT 28.2*   < > 24.8* 23.7*  MCV 94.9  --   --  94.4  PLT 201  --   --  186   < > = values in this interval not displayed.      Assessment/Plan: Obscure overt GI bleeding-initial CTA positive for bleeding in terminal ileum that was not seen on subsequent angiogram by IR and therefore embolization not performed.  Due to continued drop in her hemoglobin with recurrent black and red stool would do an EGD and if that is unrevealing we will deploy a capsule to evaluate her small intestine.  If active bleeding is seen in the terminal ileum then she may need an updated colonoscopy if the bleeding is close to the ileocecal valve otherwise she might need a double-balloon enteroscopy at a tertiary care center if she continues to show signs of bleeding.  Carb modified diet.  NPO after midnight.  Supportive care.   [name] 10/09/2024, 11:41  AM  Questions please call [phone] ID: Stephanie Barber, female   DOB: 09-22-1941, 83 y.o.   MRN: [id number]

## 2024-10-10 ENCOUNTER — Inpatient Hospital Stay (HOSPITAL_COMMUNITY): Admitting: Anesthesiology

## 2024-10-10 ENCOUNTER — Encounter (HOSPITAL_COMMUNITY)
Admission: EM | Disposition: A | Discharge status: Home / Self Care | Payer: Self-pay | Source: Home / Self Care | Attending: Internal Medicine

## 2024-10-10 ENCOUNTER — Encounter (HOSPITAL_COMMUNITY): Payer: Self-pay | Admitting: Internal Medicine

## 2024-10-10 DIAGNOSIS — E1122 Type 2 diabetes mellitus with diabetic chronic kidney disease: Secondary | ICD-10-CM

## 2024-10-10 DIAGNOSIS — I129 Hypertensive chronic kidney disease with stage 1 through stage 4 chronic kidney disease, or unspecified chronic kidney disease: Secondary | ICD-10-CM

## 2024-10-10 DIAGNOSIS — K2970 Gastritis, unspecified, without bleeding: Secondary | ICD-10-CM

## 2024-10-10 DIAGNOSIS — E1169 Type 2 diabetes mellitus with other specified complication: Secondary | ICD-10-CM

## 2024-10-10 DIAGNOSIS — D649 Anemia, unspecified: Secondary | ICD-10-CM | POA: Diagnosis not present

## 2024-10-10 DIAGNOSIS — I10 Essential (primary) hypertension: Secondary | ICD-10-CM | POA: Diagnosis not present

## 2024-10-10 DIAGNOSIS — N1832 Chronic kidney disease, stage 3b: Secondary | ICD-10-CM

## 2024-10-10 DIAGNOSIS — K922 Gastrointestinal hemorrhage, unspecified: Secondary | ICD-10-CM | POA: Diagnosis not present

## 2024-10-10 HISTORY — PX: GIVENS CAPSULE STUDY: SHX5432

## 2024-10-10 HISTORY — PX: ESOPHAGOGASTRODUODENOSCOPY: SHX5428

## 2024-10-10 LAB — GLUCOSE, CAPILLARY
Glucose-Capillary: 102 mg/dL — ABNORMAL HIGH (ref 70–99)
Glucose-Capillary: 106 mg/dL — ABNORMAL HIGH (ref 70–99)
Glucose-Capillary: 107 mg/dL — ABNORMAL HIGH (ref 70–99)
Glucose-Capillary: 134 mg/dL — ABNORMAL HIGH (ref 70–99)
Glucose-Capillary: 89 mg/dL (ref 70–99)
Glucose-Capillary: 95 mg/dL (ref 70–99)

## 2024-10-10 LAB — CBC WITH DIFFERENTIAL/PLATELET
Abs Immature Granulocytes: 0.02 K/uL (ref 0.00–0.07)
Basophils Absolute: 0 K/uL (ref 0.0–0.1)
Basophils Relative: 0 %
Eosinophils Absolute: 0.2 K/uL (ref 0.0–0.5)
Eosinophils Relative: 4 %
HCT: 29.1 % — ABNORMAL LOW (ref 36.0–46.0)
Hemoglobin: 9.5 g/dL — ABNORMAL LOW (ref 12.0–15.0)
Immature Granulocytes: 0 %
Lymphocytes Relative: 19 %
Lymphs Abs: 0.9 K/uL (ref 0.7–4.0)
MCH: 29.9 pg (ref 26.0–34.0)
MCHC: 32.6 g/dL (ref 30.0–36.0)
MCV: 91.5 fL (ref 80.0–100.0)
Monocytes Absolute: 0.5 K/uL (ref 0.1–1.0)
Monocytes Relative: 10 %
Neutro Abs: 3.2 K/uL (ref 1.7–7.7)
Neutrophils Relative %: 67 %
Platelets: 177 K/uL (ref 150–400)
RBC: 3.18 MIL/uL — ABNORMAL LOW (ref 3.87–5.11)
RDW: 13.9 % (ref 11.5–15.5)
WBC: 4.8 K/uL (ref 4.0–10.5)
nRBC: 0 % (ref 0.0–0.2)

## 2024-10-10 LAB — BPAM RBC
Blood Product Expiration Date: 202512282359
ISSUE DATE / TIME: 202511271115
Unit Type and Rh: 5100

## 2024-10-10 LAB — TYPE AND SCREEN
ABO/RH(D): O POS
Antibody Screen: NEGATIVE
Unit division: 0

## 2024-10-10 SURGERY — EGD (ESOPHAGOGASTRODUODENOSCOPY)
Anesthesia: Monitor Anesthesia Care

## 2024-10-10 MED ORDER — MENTHOL 3 MG MT LOZG
1.0000 | LOZENGE | OROMUCOSAL | Status: DC | PRN
  Administered 2024-10-10 – 2024-10-12 (×2): 3 mg via ORAL
  Filled 2024-10-10: qty 9

## 2024-10-10 MED ORDER — PROPOFOL 10 MG/ML IV BOLUS
INTRAVENOUS | Status: DC | PRN
  Administered 2024-10-10: 200 ug/kg/min via INTRAVENOUS

## 2024-10-10 MED ORDER — PHENOL 1.4 % MT LIQD
1.0000 | OROMUCOSAL | Status: DC | PRN
  Administered 2024-10-10: 1 via OROMUCOSAL
  Filled 2024-10-10: qty 177

## 2024-10-10 MED ORDER — SODIUM CHLORIDE 0.9 % IV SOLN: INTRAVENOUS | Status: DC

## 2024-10-10 MED ORDER — FENTANYL CITRATE (PF) 50 MCG/ML IJ SOSY
12.5000 ug | PREFILLED_SYRINGE | INTRAMUSCULAR | Status: DC | PRN
  Administered 2024-10-10 – 2024-10-11 (×3): 12.5 ug via INTRAVENOUS
  Filled 2024-10-10 (×3): qty 1

## 2024-10-10 MED ORDER — ACETAMINOPHEN 10 MG/ML IV SOLN
INTRAVENOUS | Status: DC | PRN
  Administered 2024-10-10: 1000 mg via INTRAVENOUS

## 2024-10-10 MED ORDER — LABETALOL HCL 5 MG/ML IV SOLN
INTRAVENOUS | Status: DC | PRN
Start: 2024-10-10 — End: 2024-10-10
  Administered 2024-10-10: 10 mg via INTRAVENOUS

## 2024-10-10 MED ORDER — LIDOCAINE VISCOUS HCL 2 % MT SOLN
15.0000 mL | Freq: Four times a day (QID) | OROMUCOSAL | Status: DC | PRN
  Administered 2024-10-10 – 2024-10-11 (×2): 15 mL via OROMUCOSAL
  Filled 2024-10-10 (×3): qty 15

## 2024-10-10 MED ORDER — ACETAMINOPHEN 10 MG/ML IV SOLN
INTRAVENOUS | Status: AC
  Filled 2024-10-10: qty 100

## 2024-10-10 NOTE — Anesthesia Postprocedure Evaluation (Signed)
 Anesthesia Post Note  Patient: Suriyah M Merwin  Procedure(s) Performed: EGD (ESOPHAGOGASTRODUODENOSCOPY)     Patient location during evaluation: PACU Anesthesia Type: MAC Level of consciousness: awake and alert Pain management: pain level controlled Vital Signs Assessment: post-procedure vital signs reviewed and stable Respiratory status: spontaneous breathing, nonlabored ventilation and respiratory function stable Cardiovascular status: blood pressure returned to baseline and stable Postop Assessment: no apparent nausea or vomiting Anesthetic complications: no   No notable events documented.  Last Vitals:  Vitals:   10/10/24 0927 10/10/24 1115  BP: (!) 172/65 (!) 135/51  Pulse:  82  Resp: 17 (!) 22  Temp: 36.4 C 36.6 C  SpO2: 95% 96%    Last Pain:  Vitals:   10/10/24 1115  TempSrc: Temporal  PainSc: Asleep                 Almarie CHRISTELLA Marchi

## 2024-10-10 NOTE — Interval H&P Note (Signed)
 History and Physical Interval Note:  10/10/2024 10:43 AM  Stephanie Barber  has presented today for surgery, with the diagnosis of Gastrointestinal bleeding.  The various methods of treatment have been discussed with the patient and family. After consideration of risks, benefits and other options for treatment, the patient has consented to  Procedure(s): EGD (ESOPHAGOGASTRODUODENOSCOPY) (N/A) as a surgical intervention.  The patient's history has been reviewed, patient examined, no change in status, stable for surgery.  I have reviewed the patient's chart and labs.  Questions were answered to the patient's satisfaction.     Stephanie Barber

## 2024-10-10 NOTE — TOC Progression Note (Signed)
 Transition of Care [facility]) - Progression Note    Patient Details  Name: Stephanie Barber MRN: [id number] Date of Birth: [birth year]  Transition of Care [facility] CM/SW Contact  Sonda Manuella Quill, RN Phone Number: 10/10/2024, 12:50 PM  Clinical Narrative:    Awaiting PT eval; IP CM is following.   Expected Discharge Plan: Home/Self Care Barriers to Discharge: Continued Medical Work up               Expected Discharge Plan and Services In-house Referral: NA Discharge Planning Services: CM Consult Post Acute Care Choice: Durable Medical Equipment Living arrangements for the past 2 months: Single Family Home                 DME Arranged: N/A DME Agency: NA       HH Arranged: NA HH Agency: NA         Social Drivers of Health (SDOH) Interventions SDOH Screenings   Food Insecurity: No Food Insecurity (10/05/2024)  Housing: Low Risk  (10/05/2024)  Transportation Needs: No Transportation Needs (10/05/2024)  Utilities: Not At Risk (10/05/2024)  Social Connections: Socially Integrated (10/05/2024)  Tobacco Use: Medium Risk (10/10/2024)    Readmission Risk Interventions    10/07/2024    4:06 PM  Readmission Risk Prevention Plan  Transportation Screening Complete  PCP or Specialist Appt within 5-7 Days Complete  Home Care Screening Complete  Medication Review (RN CM) Complete

## 2024-10-10 NOTE — Anesthesia Preprocedure Evaluation (Addendum)
 Anesthesia Evaluation  Patient identified by MRN, date of birth, ID band Patient awake    Reviewed: Allergy & Precautions, NPO status , Patient's Chart, lab work & pertinent test results, reviewed documented beta blocker date and time   History of Anesthesia Complications (+) history of anesthetic complications (problem list includes allergy to novocaine- pt doesnt know anything about this, not listed on allergy list)  Airway Mallampati: III  TM Distance: >3 FB Neck ROM: Full    Dental  (+) Dental Advisory Given, Poor Dentition, Missing,    Pulmonary sleep apnea (noncompliant w/ CPAP) , former smoker   Pulmonary exam normal breath sounds clear to auscultation       Cardiovascular hypertension (172/65 preop, per pt normally 140-150 SBP), Pt. on medications and Pt. on home beta blockers + Peripheral Vascular Disease  Normal cardiovascular exam Rhythm:Regular Rate:Normal  12/2023  1. Left ventricular ejection fraction, by estimation, is >75%. The left  ventricle has hyperdynamic function. The left ventricle has no regional  wall motion abnormalities. There is mild left ventricular hypertrophy.  Left ventricular diastolic parameters  were normal. The average left ventricular global longitudinal strain is  -23.8 %. The global longitudinal strain is normal.   2. Right ventricular systolic function is normal. The right ventricular  size is normal. There is normal pulmonary artery systolic pressure. The  estimated right ventricular systolic pressure is 27.8 mmHg.   3. The mitral valve is degenerative. Trivial mitral valve regurgitation.  No evidence of mitral stenosis.   4. Tricuspid valve regurgitation is mild to moderate.   5. The aortic valve is tricuspid. Aortic valve regurgitation is not  visualized. Aortic valve sclerosis is present, with no evidence of aortic  valve stenosis.   6. The inferior vena cava is normal in size with  greater than 50%  respiratory variability, suggesting right atrial pressure of 3 mmHg.     Neuro/Psych  Headaches  negative psych ROS   GI/Hepatic Neg liver ROS,GERD  Controlled and Medicated,,Lower GI bleed  suspect mostly from terminal ileum History of colonic pseudoaneurysm s/p embolization. Patient presents with bright red blood per rectum.  Patient has a baseline hemoglobin around 11 slowly dropping to 7.5 TODAY.     Endo/Other  diabetes    Renal/GU Renal InsufficiencyRenal disease (cr 1.43)  negative genitourinary   Musculoskeletal  (+) Arthritis , Osteoarthritis,    Abdominal   Peds  Hematology  (+) Blood dyscrasia, anemia Hb 9.5, plt 177   Anesthesia Other Findings   Reproductive/Obstetrics negative OB ROS                              Anesthesia Physical Anesthesia Plan  ASA: 3  Anesthesia Plan: MAC   Post-op Pain Management: Tylenol  PO (pre-op)*   Induction:   PONV Risk Score and Plan: 2 and Propofol  infusion and TIVA  Airway Management Planned: Natural Airway and Simple Face Mask  Additional Equipment: None  Intra-op Plan:   Post-operative Plan:   Informed Consent: I have reviewed the patients History and Physical, chart, labs and discussed the procedure including the risks, benefits and alternatives for the proposed anesthesia with the patient or authorized representative who has indicated his/her understanding and acceptance.       Plan Discussed with: CRNA  Anesthesia Plan Comments: (Has not been getting any of her home antihypertensives since admission- treating Bps with hydralazine  PRN on floor. )  Anesthesia Quick Evaluation

## 2024-10-10 NOTE — Progress Notes (Signed)
 PT Cancellation Note  Patient Details Name: [name] MRN: [id number] DOB: [birth year]   Cancelled Treatment:     PT order received but eval deferred this date - pt for EGD.  Will follow.   Kmarion Rawl 10/10/2024, 11:28 AM

## 2024-10-10 NOTE — Progress Notes (Signed)
 [facility]                                                                               Stephanie Barber, is a 83 y.o. female, DOB - Apr 12, 1941, FMW:[id number] Admit date - 10/05/2024    Outpatient Primary MD for the patient is Sun, Vyvyan, MD  LOS - 5  days    Brief summary   Stephanie Barber is a 83 y.o. 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  Onset night prior.  Started having abdominal cramping pain throughout the day with reported bright red blood per rectum.  Denies any antiplatelet, anticoagulant use.  Denies NSAID use.   In the ED, temperature 97.9 F, HR 62, RR 18, BP 124/84, SpO2 99% on 2 L nasal cannula.  WBC 3.5, hemoglobin 11.4, platelet count 264.  Sodium 141, potassium 4.8, chloride 107, CO2 23, glucose 65, BUN 23, creat 1.57.  AST 34, ALT 16, total bilirubin 0.3.  FOBT positive.  Interventional radiology consulted for angiogram.  GI consulted.  TRH consulted for admission for further evaluation management of lower GI bleed   Assessment & Plan    Assessment and Plan:    Lower GI bleed  suspect mostly from terminal ileum History of colonic pseudoaneurysm s/p embolization. Patient presents with bright red blood per rectum.  Patient has a baseline hemoglobin around 11 slowly dropping to 7.5 TODAY.  [facility] on board and following the patient. Patient had another episode of bloody bowel movement earlier this morning. CT angiogram GI bleed study initially positive for active bleeding terminal ileum. Interventional radiology was consulted and patient underwent IR visceral angiogram with no clear evidence for active GI bleeding from the superior mesenteric artery and ileocolic branches, and no embolization was performed. Shortly following admission, patient's heart rate and blood pressure  dropped transiently with repeat hemoglobin thereafter 10.4. Repeat CT angiogram abdomen/pelvis was then performed with no acute findings to explain lower GI bleeding, noted postsurgical changes in the sigmoid and prior embolotherapy changes in the right lower quadrant.  Continue with Protonix  40 mg daily She underwent EGD showing gastritis. Plan for capsule endoscopy later today.  Her hemoglobin post 1 unit of prbc transfusion remains stable around 9.5.  NPO after midnight.   Anemia of blood loss:  From GI bleed.  1 unit of prbc transfusion ordered. Repeat hemoglobin stable around 9.5.  Monitor .     Bradycardia and hypotension suspect from vasovagal event Appears to have resolved with IV fluid bolus.    Type 2 diabetes mellitus Patient's hemoglobin A1c is 6.4 Continue sliding scale insulin  for coverage. CBG (last 3)  Recent Labs    10/10/24 0402 10/10/24 0801 10/10/24 1135  GLUCAP 107* 89 102*   No changes in meds.    Essential hypertension Blood pressure parameters are optimal.  Her metoprolol  and Norvasc  have been held for transient bradycardia and hypotension. Continue to monitor   Hyperlipidemia Holding the statins for now   Stage IIIb CKD Baseline creatinine between 1.2-1.6 Creatinine been  stable around 1.3.   Throat discomfort after EGD Lozenges ordered.    Estimated body mass index is 23.23 kg/m as calculated from the following:   Height as of this encounter: 4' 11 (1.499 m).   Weight as of this encounter: 52.2 kg.  Code Status: full code.  DVT Prophylaxis:  SCDs Start: 10/05/24 1754   Level of Care: Level of care: Stepdown Family Communication: family  at bedside.   Disposition Plan:     Remains inpatient appropriate:  pending further work up for GI bleed.    Procedures:  CT angiogram of the abdomen and pelvis  Consultants:   Gastroenterology  Antimicrobials:   Anti-infectives (From admission, onward)    None         Medications  Scheduled Meds:  sodium chloride    Intravenous Once   Chlorhexidine  Gluconate Cloth  6 each Topical QHS   insulin  aspart  0-6 Units Subcutaneous Q4H   melatonin  6 mg Oral QHS   pantoprazole   40 mg Oral Q0600   Continuous Infusions:  acetaminophen      PRN Meds:.acetaminophen , acetaminophen  **OR** acetaminophen , albuterol , hydrALAZINE , menthol, ondansetron  **OR** ondansetron  (ZOFRAN ) IV, mouth rinse, phenol    Subjective:   Stephanie Barber was seen and examined today.  Scratchy throat, some spitting. No nausea or vomiting or abdominal pain. No BM today.    Objective:   Vitals:   10/10/24 1140 10/10/24 1150 10/10/24 1200 10/10/24 1220  BP: (!) 150/61 (!) 150/63 (!) 148/64 (!) 156/56  Pulse: 73 66 66 72  Resp: (!) 24 11 15 15   Temp:      TempSrc:      SpO2: 97% 97% 97% 100%  Weight:      Height:        Intake/Output Summary (Last 24 hours) at 10/10/2024 1512 Last data filed at 10/10/2024 1118 Gross per 24 hour  Intake 430.28 ml  Output 250 ml  Net 180.28 ml   Filed Weights   10/05/24 1920 10/10/24 0927  Weight: 54.6 kg 52.2 kg     Exam General exam: Appears calm and comfortable  Respiratory system: Clear to auscultation. Respiratory effort normal. Cardiovascular system: S1 & S2 heard, RRR. No JVD,  Gastrointestinal system: Abdomen is nondistended, soft and nontender.  Central nervous system: Alert and oriented. No focal neurological deficits. Extremities: Symmetric 5 x 5 power. Skin: No rashes,  Psychiatry: Mood & affect appropriate.      Data Reviewed:  I have personally reviewed following labs and imaging studies   CBC Lab Results  Component Value Date   WBC 4.8 10/10/2024   RBC 3.18 (L) 10/10/2024   HGB 9.5 (L) 10/10/2024   HCT 29.1 (L) 10/10/2024   MCV 91.5 10/10/2024   MCH 29.9 10/10/2024   PLT 177 10/10/2024   MCHC 32.6 10/10/2024   RDW 13.9 10/10/2024   LYMPHSABS 0.9 10/10/2024   MONOABS 0.5 10/10/2024   EOSABS  0.2 10/10/2024   BASOSABS 0.0 10/10/2024     Last metabolic panel Lab Results  Component Value Date   NA 143 10/09/2024   K 4.2 10/09/2024   CL 113 (H) 10/09/2024   CO2 22 10/09/2024   BUN 22 10/09/2024   CREATININE 1.43 (H) 10/09/2024   GLUCOSE 106 (H) 10/09/2024   GFRNONAA 36 (L) 10/09/2024   GFRAA 50 (L) 06/03/2019   CALCIUM  9.4 10/09/2024   PHOS 3.4 05/06/2017   PROT 7.4 10/05/2024   ALBUMIN 4.1 10/05/2024   BILITOT 0.3 10/05/2024   ALKPHOS 80  10/05/2024   AST 34 10/05/2024   ALT 16 10/05/2024   ANIONGAP 8 10/09/2024    CBG (last 3)  Recent Labs    10/10/24 0402 10/10/24 0801 10/10/24 1135  GLUCAP 107* 89 102*      Coagulation Profile: No results for input(s): INR, PROTIME in the last 168 hours.   Radiology Studies: No results found.     Elgie Butter M.D. [facility] 10/10/2024, 3:12 PM  Available via Epic secure chat 7am-7pm After 7 pm, please refer to night coverage provider listed on amion.

## 2024-10-10 NOTE — Progress Notes (Signed)
 [facility] capsule endoscopy ordered by MD Dianna.  Patient ingested capsule at 1223hrs.  Per Given's capsule instructions, patient to remain NPO until 1423hrs at which time they may progress to clear liquid diet. At 1623hrs patient may have a small snack such as a half a sandwich or a bowl of soup. At 2023hrs patient may progress to previously ordered diet.  The capsule endoscopy study will conclude at 0023hrs at which time the recorder and leads or belt can be removed and placed in a patient belongings bag. Endoscopy staff will pick up the equipment in the AM.  Instructions provided to patient and inpatient RN. Patient and RN demonstrated understanding.

## 2024-10-10 NOTE — Op Note (Signed)
 [facility] Patient Name: Stephanie Barber Procedure Date: 10/10/2024 MRN: [id number] Attending MD: [name] , MD, [id number] Date of Birth: Jan 06, 1941 CSN: [id number] Age: 83 Admit Type: Inpatient Procedure:                Upper GI endoscopy Indications:              Hematochezia, Active gastrointestinal bleeding Providers:                Jerrell KYM Sol, MD, Jacquelyn Jaci Pierce,                            RN, Felice Sar, Technician Referring MD:             hospital team Medicines:                Propofol  per Anesthesia, Monitored Anesthesia Care Complications:            No immediate complications. Estimated Blood Loss:     Estimated blood loss was minimal. Procedure:                Pre-Anesthesia Assessment:                           - Prior to the procedure, a History and Physical                            was performed, and patient medications and                            allergies were reviewed. The patient's tolerance of                            previous anesthesia was also reviewed. The risks                            and benefits of the procedure and the sedation                            options and risks were discussed with the patient.                            All questions were answered, and informed consent                            was obtained. Prior Anticoagulants: The patient has                            taken no anticoagulant or antiplatelet agents. ASA                            Grade Assessment: III - A patient with severe                            systemic disease. After reviewing the risks and  benefits, the patient was deemed in satisfactory                            condition to undergo the procedure.                           After obtaining informed consent, the endoscope was                            passed under direct vision. Throughout the                            procedure,  the patient's blood pressure, pulse, and                            oxygen saturations were monitored continuously. The                            [id number]-H190 ([id number]) Olympus endoscope was introduced                            through the mouth, and advanced to the second part                            of duodenum. The upper GI endoscopy was                            accomplished without difficulty. The patient                            tolerated the procedure well. Scope In: Scope Out: Findings:      The examined esophagus was normal.      The Z-line was regular and was found 40 cm from the incisors.      Segmental mild inflammation characterized by congestion (edema) and       erythema was found in the gastric antrum.      The cardia and gastric fundus were normal on retroflexion.      The examined duodenum was normal.      Endoscope removed and capsule attached to tip of endoscope but unable to       intubate esophagus despite multiple attempts so capsule will need to be       swallowed by patient when she wakes up. Impression:               - Normal esophagus.                           - Z-line regular, 40 cm from the incisors.                           - Gastritis.                           - Normal examined duodenum.                           -  No specimens collected. Moderate Sedation:      N/A - MAC procedure Recommendation:           - NPO until capsule swallowed and then follow                            post-capsule diet orders.                           - Observe patient's clinical course.                           - Capsule endoscopy to be swallowed by patient (see                            above for details). Procedure Code(s):        --- Professional ---                           [id number], Esophagogastroduodenoscopy, flexible,                            transoral; diagnostic, including collection of                            specimen(s) by brushing or washing, when  performed                            (separate procedure) Diagnosis Code(s):        --- Professional ---                           K92.2, Gastrointestinal hemorrhage, unspecified                           K92.1, Melena (includes Hematochezia)                           K29.70, Gastritis, unspecified, without bleeding CPT copyright 2022 American [employer]. All rights reserved. The codes documented in this report are preliminary and upon coder review may  be revised to meet current compliance requirements. [name], MD 10/10/2024 11:17:41 AM This report has been signed electronically. Number of Addenda: 0

## 2024-10-10 NOTE — Plan of Care (Signed)

## 2024-10-10 NOTE — Transfer of Care (Signed)
 Immediate Anesthesia Transfer of Care Note  Patient: Stephanie Barber  Procedure(s) Performed: EGD (ESOPHAGOGASTRODUODENOSCOPY)  Patient Location: PACU  Anesthesia Type:MAC  Level of Consciousness: drowsy  Airway & Oxygen Therapy: Patient Spontanous Breathing  Post-op Assessment: Report given to RN  Post vital signs: Reviewed and stable  Last Vitals:  Vitals Value Taken Time  BP    Temp    Pulse 81 10/10/24 11:17  Resp 18 10/10/24 11:17  SpO2 99 % 10/10/24 11:17  Vitals shown include unfiled device data.  Last Pain:  Vitals:   10/10/24 0927  TempSrc: Temporal  PainSc: 0-No pain      Patients Stated Pain Goal: 0 (10/10/24 0927)  Complications: No notable events documented.

## 2024-10-11 ENCOUNTER — Encounter (HOSPITAL_COMMUNITY): Payer: Self-pay | Admitting: Gastroenterology

## 2024-10-11 DIAGNOSIS — K922 Gastrointestinal hemorrhage, unspecified: Secondary | ICD-10-CM | POA: Diagnosis not present

## 2024-10-11 DIAGNOSIS — D649 Anemia, unspecified: Secondary | ICD-10-CM | POA: Diagnosis not present

## 2024-10-11 DIAGNOSIS — E1169 Type 2 diabetes mellitus with other specified complication: Secondary | ICD-10-CM | POA: Diagnosis not present

## 2024-10-11 DIAGNOSIS — I10 Essential (primary) hypertension: Secondary | ICD-10-CM | POA: Diagnosis not present

## 2024-10-11 DIAGNOSIS — K1379 Other lesions of oral mucosa: Secondary | ICD-10-CM | POA: Diagnosis not present

## 2024-10-11 LAB — CBC WITH DIFFERENTIAL/PLATELET
Abs Immature Granulocytes: 0.02 K/uL (ref 0.00–0.07)
Basophils Absolute: 0 K/uL (ref 0.0–0.1)
Basophils Relative: 0 %
Eosinophils Absolute: 0.2 K/uL (ref 0.0–0.5)
Eosinophils Relative: 2 %
HCT: 30.6 % — ABNORMAL LOW (ref 36.0–46.0)
Hemoglobin: 9.8 g/dL — ABNORMAL LOW (ref 12.0–15.0)
Immature Granulocytes: 0 %
Lymphocytes Relative: 12 %
Lymphs Abs: 0.8 K/uL (ref 0.7–4.0)
MCH: 29.3 pg (ref 26.0–34.0)
MCHC: 32 g/dL (ref 30.0–36.0)
MCV: 91.3 fL (ref 80.0–100.0)
Monocytes Absolute: 0.5 K/uL (ref 0.1–1.0)
Monocytes Relative: 7 %
Neutro Abs: 5.1 K/uL (ref 1.7–7.7)
Neutrophils Relative %: 79 %
Platelets: 206 K/uL (ref 150–400)
RBC: 3.35 MIL/uL — ABNORMAL LOW (ref 3.87–5.11)
RDW: 13.7 % (ref 11.5–15.5)
WBC: 6.5 K/uL (ref 4.0–10.5)
nRBC: 0 % (ref 0.0–0.2)

## 2024-10-11 LAB — BASIC METABOLIC PANEL WITH GFR
Anion gap: 12 (ref 5–15)
BUN: 17 mg/dL (ref 8–23)
CO2: 22 mmol/L (ref 22–32)
Calcium: 9.2 mg/dL (ref 8.9–10.3)
Chloride: 106 mmol/L (ref 98–111)
Creatinine, Ser: 1.42 mg/dL — ABNORMAL HIGH (ref 0.44–1.00)
GFR, Estimated: 36 mL/min — ABNORMAL LOW (ref >60)
Glucose, Bld: 93 mg/dL (ref 70–99)
Potassium: 4 mmol/L (ref 3.5–5.1)
Sodium: 141 mmol/L (ref 135–145)

## 2024-10-11 LAB — GLUCOSE, CAPILLARY
Glucose-Capillary: 108 mg/dL — ABNORMAL HIGH (ref 70–99)
Glucose-Capillary: 108 mg/dL — ABNORMAL HIGH (ref 70–99)
Glucose-Capillary: 119 mg/dL — ABNORMAL HIGH (ref 70–99)
Glucose-Capillary: 293 mg/dL — ABNORMAL HIGH (ref 70–99)
Glucose-Capillary: 347 mg/dL — ABNORMAL HIGH (ref 70–99)
Glucose-Capillary: 90 mg/dL (ref 70–99)
Glucose-Capillary: 91 mg/dL (ref 70–99)
Glucose-Capillary: 94 mg/dL (ref 70–99)

## 2024-10-11 MED ORDER — POLYETHYLENE GLYCOL 3350 17 G PO PACK: 17.0000 g | PACK | Freq: Every day | ORAL | Status: DC | PRN

## 2024-10-11 MED ORDER — SENNOSIDES-DOCUSATE SODIUM 8.6-50 MG PO TABS
2.0000 | ORAL_TABLET | Freq: Two times a day (BID) | ORAL | Status: DC
  Administered 2024-10-11 – 2024-10-12 (×3): 2 via ORAL
  Filled 2024-10-11 (×3): qty 2

## 2024-10-11 MED ORDER — DEXAMETHASONE SOD PHOSPHATE PF 10 MG/ML IJ SOLN
10.0000 mg | Freq: Once | INTRAMUSCULAR | Status: AC
  Administered 2024-10-11: 10 mg via INTRAVENOUS

## 2024-10-11 NOTE — Evaluation (Signed)
 Physical Therapy Evaluation Patient Details Name: Stephanie Barber MRN: [id number] DOB: [birth year] Today's Date: 10/11/2024  History of Present Illness  Pt admitted from home 2* recurrent lower GIB with anemia.  Pt with hx of DM, PVD, CKD, rectal prolapse, back surgery, and narcolepsy/cataplexy  Clinical Impression  Pt admitted as above and presenting with functional mobility limitations 2* generalized weakness, decreased activity tolerance and mild ambulatory balance deficits.  This date, pt up to ambulate 180' in hall with RW and CGA for safety.  Pt should progress to dc home with family assist.        If plan is discharge home, recommend the following: A little help with walking and/or transfers;A little help with bathing/dressing/bathroom;Assistance with cooking/housework;Assist for transportation;Help with stairs or ramp for entrance   Can travel by private vehicle        Equipment Recommendations None recommended by PT  Recommendations for Other Services       Functional Status Assessment Patient has had a recent decline in their functional status and demonstrates the ability to make significant improvements in function in a reasonable and predictable amount of time.     Precautions / Restrictions Precautions Precautions: Fall Restrictions Weight Bearing Restrictions Per Provider Order: No      Mobility  Bed Mobility Overal bed mobility: Needs Assistance Bed Mobility: Supine to Sit     Supine to sit: Min assist     General bed mobility comments: min assist to bring trunk to upright    Transfers Overall transfer level: Needs assistance Equipment used: Rolling walker (2 wheels) Transfers: Sit to/from Stand Sit to Stand: Min assist, Contact guard assist           General transfer comment: cues for safe transition position and use of UEs to self assist    Ambulation/Gait Ambulation/Gait assistance: Min assist, Contact guard assist Gait Distance (Feet):  180 Feet Assistive device: Rolling walker (2 wheels) Gait Pattern/deviations: Step-to pattern, Step-through pattern, Decreased step length - right, Decreased step length - left, Shuffle, Trunk flexed Gait velocity: decr     General Gait Details: cues for posture and position from [employer]            Wheelchair Mobility     Tilt Bed    Modified Rankin (Stroke Patients Only)       Balance Overall balance assessment: Needs assistance Sitting-balance support: No upper extremity supported, Feet supported Sitting balance-Leahy Scale: Good     Standing balance support: Bilateral upper extremity supported Standing balance-Leahy Scale: Poor                               Pertinent Vitals/Pain Pain Assessment Pain Assessment: No/denies pain    Home Living Family/patient expects to be discharged to:: Private residence Living Arrangements: Spouse/significant other Available Help at Discharge: Family Type of Home: House Home Access: Stairs to enter Entrance Stairs-Rails: Right Entrance Stairs-Number of Steps: 3   Home Layout: One level Home Equipment: Rollator (4 wheels)      Prior Function Prior Level of Function : Needs assist             Mobility Comments: usisng rollator outside of home but furniture walks inside ADLs Comments: Independent     Extremity/Trunk Assessment   Upper Extremity Assessment Upper Extremity Assessment: Generalized weakness    Lower Extremity Assessment Lower Extremity Assessment: Generalized weakness    Cervical / Trunk Assessment Cervical / Trunk  Assessment: Kyphotic  Communication   Communication Communication: No apparent difficulties    Cognition Arousal: Alert Behavior During Therapy: WFL for tasks assessed/performed   PT - Cognitive impairments: Memory                         Following commands: Intact       Cueing Cueing Techniques: Verbal cues     General Comments       Exercises     Assessment/Plan    PT Assessment Patient needs continued PT services  PT Problem List Decreased strength;Decreased activity tolerance;Decreased balance;Decreased mobility;Decreased knowledge of use of DME       PT Treatment Interventions DME instruction;Gait training;Stair training;Functional mobility training;Therapeutic activities;Therapeutic exercise;Balance training;Patient/family education    PT Goals (Current goals can be found in the Care Plan section)  Acute Rehab PT Goals Patient Stated Goal: Regain IND and return home PT Goal Formulation: With patient Time For Goal Achievement: 10/23/24 Potential to Achieve Goals: Good    Frequency Min 2X/week     Co-evaluation               AM-PAC PT 6 Clicks Mobility  Outcome Measure Help needed turning from your back to your side while in a flat bed without using bedrails?: A Little Help needed moving from lying on your back to sitting on the side of a flat bed without using bedrails?: A Little Help needed moving to and from a bed to a chair (including a wheelchair)?: A Little Help needed standing up from a chair using your arms (e.g., wheelchair or bedside chair)?: A Little Help needed to walk in hospital room?: A Little Help needed climbing 3-5 steps with a railing? : A Little 6 Click Score: 18    End of Session Equipment Utilized During Treatment: Gait belt Activity Tolerance: Patient tolerated treatment well Patient left: in chair;with call bell/phone within reach;with nursing/sitter in room;with family/visitor present Nurse Communication: Mobility status PT Visit Diagnosis: Unsteadiness on feet (R26.81);Difficulty in walking, not elsewhere classified (R26.2)    Time: 9092-9067 PT Time Calculation (min) (ACUTE ONLY): 25 min   Charges:   PT Evaluation $PT Eval Low Complexity: 1 Low PT Treatments $Gait Training: 8-22 mins PT General Charges $$ ACUTE PT VISIT: 1 Visit         [facility]  PT Acute Rehabilitation Services Office [phone]   Bandon Sherwin 10/11/2024, 10:16 AM

## 2024-10-11 NOTE — Progress Notes (Signed)
 Subjective: Sore throat after endoscopy. No further melena.  Objective: Vital signs in last 24 hours: Temp:  [97.8 F (36.6 C)-99.1 F (37.3 C)] 98 F (36.7 C) (11/29 0800) Pulse Rate:  [66-105] 103 (11/29 1028) Resp:  [9-26] 15 (11/29 1028) BP: (124-201)/(51-136) 158/62 (11/29 1028) SpO2:  [90 %-100 %] 96 % (11/29 1028) Weight change:  Last BM Date : 10/07/24  PE: GEN:  NAD  Lab Results: CBC    Component Value Date/Time   WBC 6.5 10/11/2024 0254   RBC 3.35 (L) 10/11/2024 0254   HGB 9.8 (L) 10/11/2024 0254   HGB 11.3 (L) 06/06/2023 1110   HCT 30.6 (L) 10/11/2024 0254   PLT 206 10/11/2024 0254   PLT 228 06/06/2023 1110   MCV 91.3 10/11/2024 0254   MCH 29.3 10/11/2024 0254   MCHC 32.0 10/11/2024 0254   RDW 13.7 10/11/2024 0254   LYMPHSABS 0.8 10/11/2024 0254   MONOABS 0.5 10/11/2024 0254   EOSABS 0.2 10/11/2024 0254   BASOSABS 0.0 10/11/2024 0254  CMP     Component Value Date/Time   NA 141 10/11/2024 0254   K 4.0 10/11/2024 0254   CL 106 10/11/2024 0254   CO2 22 10/11/2024 0254   GLUCOSE 93 10/11/2024 0254   BUN 17 10/11/2024 0254   CREATININE 1.42 (H) 10/11/2024 0254   CREATININE 1.50 (H) 06/06/2023 1110   CALCIUM  9.2 10/11/2024 0254   PROT 7.4 10/05/2024 1129   ALBUMIN 4.1 10/05/2024 1129   AST 34 10/05/2024 1129   AST 16 06/06/2023 1110   ALT 16 10/05/2024 1129   ALT 15 06/06/2023 1110   ALKPHOS 80 10/05/2024 1129   BILITOT 0.3 10/05/2024 1129   BILITOT 0.4 06/06/2023 1110   GFRNONAA 36 (L) 10/11/2024 0254   GFRNONAA 34 (L) 06/06/2023 1110   Assessment:  Recurrent GI bleeding.  Bleeding has stopped. Acute blood loss anemia.  Plan:   Capsule deployed yesterday.  She is not actively bleeding, if she does well with advancing diet and has stable hemoglobin, she could conceivably go home tomorrow and inpatient hospital doctor would review capsule Monday.  On other hand, if she has recurrent active bleeding, I would see if one of my partners who reads  capsule endoscopies (I do not) is available during this holiday weekend to review capsule. [facility] will revisit Monday if patient still in hospital.   Stephanie Barber 10/11/2024, 10:33 AM   Cell [phone] If no answer or after 5 PM call [phone]

## 2024-10-11 NOTE — Consult Note (Signed)
 Reason for Consult:uvula injury Referring Physician: Dr Cherlyn Rosella Stephanie Barber is an 83 y.o. female.  HPI: hx of GI procedure and now with sore throat. She has a injured uvula. She is having a lot of pain in the back of the throat. Still able to get fluids down. No breathing issues. No stridor.   Past Medical History:  Diagnosis Date   Arthritis    Chronic headache    Complication of anesthesia    allergy to Novocaine    Diabetes mellitus    GERD (gastroesophageal reflux disease)    GI bleed    Heart murmur    High cholesterol    Hyperlipidemia    Hypertension    Intracranial atherosclerosis    per MRA   Migraine    Narcolepsy and cataplexy    OSA    states has not used CPAP in over a year   Peripheral vascular disease    Rectal prolapse    Stage 3b chronic kidney disease (CKD) (HCC)    Trigger finger of all digits of right hand     Past Surgical History:  Procedure Laterality Date   ABDOMINAL HYSTERECTOMY     Fibroids   BACK SURGERY     L4,L5 discectomy   BOTOX  INJECTION N/A 01/02/2023   Procedure: BOTOX  INJECTION;  Surgeon: Elisabeth Valli BIRCH, MD;  Location: [facility];  Service: Urology;  Laterality: N/A;   CARDIAC CATHETERIZATION     CARPAL TUNNEL RELEASE Right 01/18/2017   COLONOSCOPY N/A 07/28/2022   Procedure: COLONOSCOPY;  Surgeon: Rosalie Kitchens, MD;  Location: [facility];  Service: Gastroenterology;  Laterality: N/A;   COLONOSCOPY WITH PROPOFOL  N/A 06/17/2021   Procedure: COLONOSCOPY WITH PROPOFOL ;  Surgeon: Dianna Specking, MD;  Location: [facility];  Service: Endoscopy;  Laterality: N/A;   CYSTOSCOPY WITH INJECTION N/A 01/02/2023   Procedure: CYSTOSCOPY WITH LUEVENIA;  Surgeon: Elisabeth Valli BIRCH, MD;  Location: [facility];  Service: Urology;  Laterality: N/A;  45 MINS   ESOPHAGOGASTRODUODENOSCOPY (EGD) WITH PROPOFOL  N/A 06/17/2021   Procedure: ESOPHAGOGASTRODUODENOSCOPY (EGD) WITH PROPOFOL ;  Surgeon: Dianna Specking, MD;  Location: [facility];  Service:  Endoscopy;  Laterality: N/A;   IR ANGIOGRAM SELECTIVE EACH ADDITIONAL VESSEL  04/06/2024   IR ANGIOGRAM SELECTIVE EACH ADDITIONAL VESSEL  04/06/2024   IR ANGIOGRAM SELECTIVE EACH ADDITIONAL VESSEL  04/06/2024   IR ANGIOGRAM SELECTIVE EACH ADDITIONAL VESSEL  04/06/2024   IR ANGIOGRAM SELECTIVE EACH ADDITIONAL VESSEL  04/06/2024   IR ANGIOGRAM SELECTIVE EACH ADDITIONAL VESSEL  04/06/2024   IR ANGIOGRAM SELECTIVE EACH ADDITIONAL VESSEL  10/05/2024   IR ANGIOGRAM SELECTIVE EACH ADDITIONAL VESSEL  10/05/2024   IR ANGIOGRAM SELECTIVE EACH ADDITIONAL VESSEL  10/05/2024   IR ANGIOGRAM SELECTIVE EACH ADDITIONAL VESSEL  10/05/2024   IR ANGIOGRAM VISCERAL SELECTIVE  04/06/2024   IR ANGIOGRAM VISCERAL SELECTIVE  10/05/2024   IR EMBO ART  VEN HEMORR LYMPH EXTRAV  INC GUIDE ROADMAPPING  04/06/2024   IR US  GUIDE VASC ACCESS RIGHT  04/06/2024   IR US  GUIDE VASC ACCESS RIGHT  10/05/2024   KIDNEY SURGERY  1998   Right.  growth removed   POLYPECTOMY  06/17/2021   Procedure: POLYPECTOMY;  Surgeon: Dianna Specking, MD;  Location: [facility];  Service: Endoscopy;;   POLYPECTOMY  07/28/2022   Procedure: POLYPECTOMY;  Surgeon: Rosalie Kitchens, MD;  Location: [facility];  Service: Gastroenterology;;   rectal prolapse repair  12/05/2011   ROTATOR CUFF REPAIR     Left   SHOULDER ARTHROSCOPY WITH SUBACROMIAL  DECOMPRESSION, ROTATOR CUFF REPAIR AND BICEP TENDON REPAIR Left 05/25/2015   Procedure: SHOULDER ARTHROSCOPY WITH SUBACROMIAL DECOMPRESSION, ROTATOR CUFF REPAIR AND BICEP TENODESIS, DEBRIDEMENT. ;  Surgeon: Glendia Cordella Hutchinson, MD;  Location: [facility];  Service: Orthopedics;  Laterality: Left;  LEFT SHOULDER ROTATOR CUFF TEAR REPAIR, ARTHROSCOPY, DEBRIDEMENT, BICEPS TENODESIS, SUBACROMIAL DECOMPRESSION.   TOTAL ABDOMINAL HYSTERECTOMY     TRIGGER FINGER RELEASE Right 04/30/2018   Procedure: RELEASE TRIGGER FINGER/A-1 PULLEY RIGHT MIDDLE  AND SMALLn ;  Surgeon: Murrell Kuba, MD;  Location: [facility];  Service:  Orthopedics;  Laterality: Right;  FAB, Bier block   TUBAL LIGATION      Family History  Problem Relation Age of Onset   Heart disease Mother    Throat cancer Father    Cancer Father        stomach   Kidney cancer Brother    Heart disease Brother     Social History:  reports that she quit smoking about 43 years ago. Her smoking use included cigarettes. She has never used smokeless tobacco. She reports that she does not drink alcohol  and does not use drugs.  Allergies:  Allergies  Allergen Reactions   Codeine Anaphylaxis   Morphine And Codeine Anaphylaxis   Donepezil  Diarrhea   Lactose Intolerance (Gi) Diarrhea and Nausea Only   Mirabegron     Other Reaction(s): Caused her to go to the hospital   Rivaroxaban      Other Reaction(s): rectal bleeding    Medications: I have reviewed the patient's current medications.  Results for orders placed or performed during the hospital encounter of 10/05/24 (from the past 48 hours)  Glucose, capillary     Status: Abnormal   Collection Time: 10/09/24 12:01 PM  Result Value Ref Range   Glucose-Capillary 107 (H) 70 - 99 mg/dL    Comment: Glucose reference range applies only to samples taken after fasting for at least 8 hours.   Comment 1 Notify RN    Comment 2 Document in Chart   Glucose, capillary     Status: None   Collection Time: 10/09/24  3:44 PM  Result Value Ref Range   Glucose-Capillary 87 70 - 99 mg/dL    Comment: Glucose reference range applies only to samples taken after fasting for at least 8 hours.   Comment 1 Notify RN    Comment 2 Document in Chart   Hemoglobin and hematocrit, blood     Status: Abnormal   Collection Time: 10/09/24  5:09 PM  Result Value Ref Range   Hemoglobin 9.7 (L) 12.0 - 15.0 g/dL    Comment: POST TRANSFUSION SPECIMEN   HCT 29.7 (L) 36.0 - 46.0 %    Comment: Performed at [facility], [address] [place], [place] [address]  Glucose, capillary     Status: None   Collection  Time: 10/09/24  8:21 PM  Result Value Ref Range   Glucose-Capillary 98 70 - 99 mg/dL    Comment: Glucose reference range applies only to samples taken after fasting for at least 8 hours.  Glucose, capillary     Status: None   Collection Time: 10/10/24 12:18 AM  Result Value Ref Range   Glucose-Capillary 95 70 - 99 mg/dL    Comment: Glucose reference range applies only to samples taken after fasting for at least 8 hours.  CBC with Differential/Platelet     Status: Abnormal   Collection Time: 10/10/24  3:35 AM  Result Value Ref Range   WBC  4.8 4.0 - 10.5 K/uL   RBC 3.18 (L) 3.87 - 5.11 MIL/uL   Hemoglobin 9.5 (L) 12.0 - 15.0 g/dL   HCT 70.8 (L) 63.9 - 53.9 %   MCV 91.5 80.0 - 100.0 fL   MCH 29.9 26.0 - 34.0 pg   MCHC 32.6 30.0 - 36.0 g/dL   RDW 86.0 88.4 - 84.4 %   Platelets 177 150 - 400 K/uL   nRBC 0.0 0.0 - 0.2 %   Neutrophils Relative % 67 %   Neutro Abs 3.2 1.7 - 7.7 K/uL   Lymphocytes Relative 19 %   Lymphs Abs 0.9 0.7 - 4.0 K/uL   Monocytes Relative 10 %   Monocytes Absolute 0.5 0.1 - 1.0 K/uL   Eosinophils Relative 4 %   Eosinophils Absolute 0.2 0.0 - 0.5 K/uL   Basophils Relative 0 %   Basophils Absolute 0.0 0.0 - 0.1 K/uL   Immature Granulocytes 0 %   Abs Immature Granulocytes 0.02 0.00 - 0.07 K/uL    Comment: Performed at [facility], [address] [place], [place] [address]  Glucose, capillary     Status: Abnormal   Collection Time: 10/10/24  4:02 AM  Result Value Ref Range   Glucose-Capillary 107 (H) 70 - 99 mg/dL    Comment: Glucose reference range applies only to samples taken after fasting for at least 8 hours.  Glucose, capillary     Status: None   Collection Time: 10/10/24  8:01 AM  Result Value Ref Range   Glucose-Capillary 89 70 - 99 mg/dL    Comment: Glucose reference range applies only to samples taken after fasting for at least 8 hours.  Glucose, capillary     Status: Abnormal   Collection Time: 10/10/24 11:35 AM  Result Value  Ref Range   Glucose-Capillary 102 (H) 70 - 99 mg/dL    Comment: Glucose reference range applies only to samples taken after fasting for at least 8 hours.  Glucose, capillary     Status: Abnormal   Collection Time: 10/10/24  3:30 PM  Result Value Ref Range   Glucose-Capillary 134 (H) 70 - 99 mg/dL    Comment: Glucose reference range applies only to samples taken after fasting for at least 8 hours.  Glucose, capillary     Status: Abnormal   Collection Time: 10/10/24  8:16 PM  Result Value Ref Range   Glucose-Capillary 106 (H) 70 - 99 mg/dL    Comment: Glucose reference range applies only to samples taken after fasting for at least 8 hours.  Glucose, capillary     Status: Abnormal   Collection Time: 10/11/24 12:10 AM  Result Value Ref Range   Glucose-Capillary 108 (H) 70 - 99 mg/dL    Comment: Glucose reference range applies only to samples taken after fasting for at least 8 hours.  CBC with Differential/Platelet     Status: Abnormal   Collection Time: 10/11/24  2:54 AM  Result Value Ref Range   WBC 6.5 4.0 - 10.5 K/uL   RBC 3.35 (L) 3.87 - 5.11 MIL/uL   Hemoglobin 9.8 (L) 12.0 - 15.0 g/dL   HCT 69.3 (L) 63.9 - 53.9 %   MCV 91.3 80.0 - 100.0 fL   MCH 29.3 26.0 - 34.0 pg   MCHC 32.0 30.0 - 36.0 g/dL   RDW 86.2 88.4 - 84.4 %   Platelets 206 150 - 400 K/uL   nRBC 0.0 0.0 - 0.2 %   Neutrophils Relative % 79 %  Neutro Abs 5.1 1.7 - 7.7 K/uL   Lymphocytes Relative 12 %   Lymphs Abs 0.8 0.7 - 4.0 K/uL   Monocytes Relative 7 %   Monocytes Absolute 0.5 0.1 - 1.0 K/uL   Eosinophils Relative 2 %   Eosinophils Absolute 0.2 0.0 - 0.5 K/uL   Basophils Relative 0 %   Basophils Absolute 0.0 0.0 - 0.1 K/uL   Immature Granulocytes 0 %   Abs Immature Granulocytes 0.02 0.00 - 0.07 K/uL    Comment: Performed at [facility], [address] [place], [place] [address]  Basic metabolic panel with GFR     Status: Abnormal   Collection Time: 10/11/24  2:54 AM  Result Value Ref  Range   Sodium 141 135 - 145 mmol/L   Potassium 4.0 3.5 - 5.1 mmol/L   Chloride 106 98 - 111 mmol/L   CO2 22 22 - 32 mmol/L   Glucose, Bld 93 70 - 99 mg/dL    Comment: Glucose reference range applies only to samples taken after fasting for at least 8 hours.   BUN 17 8 - 23 mg/dL   Creatinine, Ser 8.57 (H) 0.44 - 1.00 mg/dL   Calcium  9.2 8.9 - 10.3 mg/dL   GFR, Estimated 36 (L) >60 mL/min    Comment: (NOTE) Calculated using the CKD-EPI Creatinine Equation (2021)    Anion gap 12 5 - 15    Comment: Performed at [facility], [address] [place], [place] [address]  Glucose, capillary     Status: None   Collection Time: 10/11/24  3:53 AM  Result Value Ref Range   Glucose-Capillary 90 70 - 99 mg/dL    Comment: Glucose reference range applies only to samples taken after fasting for at least 8 hours.  Glucose, capillary     Status: None   Collection Time: 10/11/24  8:10 AM  Result Value Ref Range   Glucose-Capillary 91 70 - 99 mg/dL    Comment: Glucose reference range applies only to samples taken after fasting for at least 8 hours.   Comment 1 Notify RN    Comment 2 Document in Chart     No results found.  ROS Blood pressure (!) 158/62, pulse (!) 103, temperature 98 F (36.7 C), temperature source Oral, resp. rate 15, height 4' 11 (1.499 m), weight 52.2 kg, SpO2 96%. Physical Exam Constitutional:      Appearance: Normal appearance.  HENT:     Head: Normocephalic and atraumatic.     Right Ear: Tympanic membrane is without lesions and middle ear aerated, ear canal and external ear normal.     Left Ear: Tympanic membrane is without lesions and middle ear aerated, ear canal and external ear normal.     Nose: Nose normal. Turbinates with mild hypertrophy, No significant swelling or masses.     Oral cavity/oropharynx: uvula is elongated and swollen. There is a slight ulcer on the base. No airway issues or stridor.  Mucous membranes are moist. No lesions or  masses    Larynx: normal voice. Mirror attempted without success    Eyes:     Extraocular Movements: Extraocular movements intact.     Conjunctiva/sclera: Conjunctivae normal.     Pupils: Pupils are equal, round, and reactive to light.  Cardiovascular:     Rate and Rhythm: Normal rate.  Pulmonary:     Effort: Pulmonary effort is normal.  Musculoskeletal:     Cervical back: Normal range of motion and neck supple.  No rigidity.  Lymphadenopathy:     Cervical: No cervical adenopathy or masses.salivary glands without lesions. .  Neurological:     Mental Status: He is alert. CN 2-12 intact. No nystagmus      Assessment/Plan: Uvula hypertrophy and ulceration- the uvula likely injured in the intubation but it should heal without issue. The uvula is swollen and slight ulceration. Steroid dose may help- 10 mg decadron . No specific treatment other than pain control.   Norleen Notice 10/11/2024, 10:48 AM

## 2024-10-11 NOTE — Progress Notes (Signed)
 Capsule endoscopy study concluded. The recorder and the belt removed and placed in a patient belonging as instructed. To be picked in the morning by the endoscopy stuff.

## 2024-10-11 NOTE — Progress Notes (Signed)
 [facility]                                                                               Stephanie Barber, is a 83 y.o. female, DOB - [birth year], FMW:[id number] Admit date - 10/05/2024    Outpatient Primary MD for the patient is Barber, Vyvyan, MD  LOS - 6  days    Brief summary   Stephanie Barber is a 83 y.o. 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  Onset night prior.  Started having abdominal cramping pain throughout the day with reported bright red blood per rectum.  Denies any antiplatelet, anticoagulant use.  Denies NSAID use.   In the ED, temperature 97.9 F, HR 62, RR 18, BP 124/84, SpO2 99% on 2 L nasal cannula.  WBC 3.5, hemoglobin 11.4, platelet count 264.  Sodium 141, potassium 4.8, chloride 107, CO2 23, glucose 65, BUN 23, creat 1.57.  AST 34, ALT 16, total bilirubin 0.3.  FOBT positive.  Interventional radiology consulted for angiogram.  GI consulted.  TRH consulted for admission for further evaluation management of lower GI bleed   Assessment & Plan    Assessment and Plan:    Lower GI bleed  suspect mostly from terminal ileum History of colonic pseudoaneurysm s/p embolization. Patient presents with bright red blood per rectum.  Patient has a baseline hemoglobin around 11 slowly dropping to 7.5 TODAY.  [facility] on board and following the patient. Patient had another episode of bloody bowel movement earlier this morning. CT angiogram GI bleed study initially positive for active bleeding terminal ileum. Interventional radiology was consulted and patient underwent IR visceral angiogram with no clear evidence for active GI bleeding from the superior mesenteric artery and ileocolic branches, and no embolization was performed. Shortly following admission, patient's heart rate and blood pressure  dropped transiently with repeat hemoglobin thereafter 10.4. Repeat CT angiogram abdomen/pelvis was then performed with no acute findings to explain lower GI bleeding, noted postsurgical changes in the sigmoid and prior embolotherapy changes in the right lower quadrant.  Continue with Protonix  40 mg daily She underwent EGD showing gastritis. Capsule endoscopy done, results are pending.  Her hemoglobin post 1 unit of prbc transfusion remains stable around 9.5/ 9.9   Anemia of blood loss:  From GI bleed.  1 unit of prbc transfusion ordered. Repeat hemoglobin stable around 9.5.  Monitor .  Uvula hypertrophy and ulceration: Post EGD.  ENT consulted and recommendations given.  One dose of decadron  ordered.  Pain control.    Bradycardia and hypotension suspect from vasovagal event Appears to have resolved with IV fluid bolus.    Type 2 diabetes mellitus Patient's hemoglobin A1c is 6.4 Continue sliding scale insulin  for coverage. CBG (last 3)  Recent Labs    10/11/24 0810 10/11/24 1156 10/11/24 1349  GLUCAP 91 108* 94   No changes in meds.    Essential hypertension Blood pressure parameters are well controlled.  Her metoprolol  and Norvasc  have been held for transient bradycardia and hypotension. Continue to monitor  Hyperlipidemia Holding the statins for now   Stage IIIb CKD Baseline creatinine between 1.2-1.6 Creatinine been stable around 1.3.      Estimated body mass index is 23.23 kg/m as calculated from the following:   Height as of this encounter: 4' 11 (1.499 m).   Weight as of this encounter: 52.2 kg.  Code Status: full code.  DVT Prophylaxis:  SCDs Start: 10/05/24 1754   Level of Care: Level of care: Stepdown Family Communication: family  at bedside.   Disposition Plan:     Remains inpatient appropriate:  pending further work up for GI bleed.    Procedures:  CT angiogram of the abdomen and pelvis EGD Capsule endoscopy.   Consultants:    Gastroenterology ENT  Antimicrobials:   Anti-infectives (From admission, onward)    None        Medications  Scheduled Meds:  sodium chloride    Intravenous Once   Chlorhexidine  Gluconate Cloth  6 each Topical QHS   insulin  aspart  0-6 Units Subcutaneous Q4H   melatonin  6 mg Oral QHS   pantoprazole   40 mg Oral Q0600   senna-docusate  2 tablet Oral BID   Continuous Infusions:   PRN Meds:.acetaminophen  **OR** acetaminophen , albuterol , fentaNYL  (SUBLIMAZE ) injection, hydrALAZINE , lidocaine , menthol, ondansetron  **OR** ondansetron  (ZOFRAN ) IV, mouth rinse, phenol, polyethylene glycol    Subjective:   Sandie Kuechle was seen and examined today.  Throat pain has not improved.  Persistent. Unable to take solids.   Objective:   Vitals:   10/11/24 1028 10/11/24 1200 10/11/24 1300 10/11/24 1353  BP: (!) 158/62 133/68 114/64   Pulse: (!) 103 82 88 87  Resp: 15 19 19  (!) 24  Temp:  98.2 F (36.8 C)    TempSrc:  Oral    SpO2: 96% 96% 94% 96%  Weight:      Height:        Intake/Output Summary (Last 24 hours) at 10/11/2024 1519 Last data filed at 10/11/2024 1000 Gross per 24 hour  Intake 113 ml  Output 400 ml  Net -287 ml   Filed Weights   10/05/24 1920 10/10/24 0927  Weight: 54.6 kg 52.2 kg     Exam General exam: Appears calm and comfortable  Respiratory system: Clear to auscultation. Respiratory effort normal. Cardiovascular system: S1 & S2 heard, RRR. N Gastrointestinal system: Abdomen is nondistended, soft and nontender.  Central nervous system: Alert and oriented.  Extremities: Symmetric 5 x 5 power. Skin: No rashes, Psychiatry:  Mood & affect appropriate.       Data Reviewed:  I have personally reviewed following labs and imaging studies   CBC Lab Results  Component Value Date   WBC 6.5 10/11/2024   RBC 3.35 (L) 10/11/2024   HGB 9.8 (L) 10/11/2024   HCT 30.6 (L) 10/11/2024   MCV 91.3 10/11/2024   MCH 29.3 10/11/2024   PLT 206  10/11/2024   MCHC 32.0 10/11/2024   RDW 13.7 10/11/2024   LYMPHSABS 0.8 10/11/2024   MONOABS 0.5 10/11/2024   EOSABS 0.2 10/11/2024   BASOSABS 0.0 10/11/2024     Last metabolic panel Lab Results  Component Value Date   NA 141 10/11/2024   K 4.0 10/11/2024   CL 106 10/11/2024   CO2 22 10/11/2024   BUN 17 10/11/2024   CREATININE 1.42 (H) 10/11/2024   GLUCOSE 93 10/11/2024   GFRNONAA 36 (L) 10/11/2024   GFRAA 50 (L) 06/03/2019   CALCIUM  9.2 10/11/2024   PHOS 3.4 05/06/2017   PROT  7.4 10/05/2024   ALBUMIN 4.1 10/05/2024   BILITOT 0.3 10/05/2024   ALKPHOS 80 10/05/2024   AST 34 10/05/2024   ALT 16 10/05/2024   ANIONGAP 12 10/11/2024    CBG (last 3)  Recent Labs    10/11/24 0810 10/11/24 1156 10/11/24 1349  GLUCAP 91 108* 94      Coagulation Profile: No results for input(s): INR, PROTIME in the last 168 hours.   Radiology Studies: No results found.     Elgie Butter M.D. [facility] 10/11/2024, 3:19 PM  Available via Epic secure chat 7am-7pm After 7 pm, please refer to night coverage provider listed on amion.

## 2024-10-12 DIAGNOSIS — K922 Gastrointestinal hemorrhage, unspecified: Secondary | ICD-10-CM | POA: Diagnosis not present

## 2024-10-12 DIAGNOSIS — I10 Essential (primary) hypertension: Secondary | ICD-10-CM | POA: Diagnosis not present

## 2024-10-12 DIAGNOSIS — E7800 Pure hypercholesterolemia, unspecified: Secondary | ICD-10-CM | POA: Diagnosis not present

## 2024-10-12 DIAGNOSIS — N1832 Chronic kidney disease, stage 3b: Secondary | ICD-10-CM | POA: Diagnosis not present

## 2024-10-12 DIAGNOSIS — F0390 Unspecified dementia without behavioral disturbance: Secondary | ICD-10-CM | POA: Diagnosis not present

## 2024-10-12 DIAGNOSIS — E1169 Type 2 diabetes mellitus with other specified complication: Secondary | ICD-10-CM | POA: Diagnosis not present

## 2024-10-12 LAB — CBC WITH DIFFERENTIAL/PLATELET
Abs Immature Granulocytes: 0.02 K/uL (ref 0.00–0.07)
Basophils Absolute: 0 K/uL (ref 0.0–0.1)
Basophils Relative: 0 %
Eosinophils Absolute: 0 K/uL (ref 0.0–0.5)
Eosinophils Relative: 0 %
HCT: 29 % — ABNORMAL LOW (ref 36.0–46.0)
Hemoglobin: 9.5 g/dL — ABNORMAL LOW (ref 12.0–15.0)
Immature Granulocytes: 0 %
Lymphocytes Relative: 10 %
Lymphs Abs: 0.6 K/uL — ABNORMAL LOW (ref 0.7–4.0)
MCH: 29.5 pg (ref 26.0–34.0)
MCHC: 32.8 g/dL (ref 30.0–36.0)
MCV: 90.1 fL (ref 80.0–100.0)
Monocytes Absolute: 0.4 K/uL (ref 0.1–1.0)
Monocytes Relative: 8 %
Neutro Abs: 4.6 K/uL (ref 1.7–7.7)
Neutrophils Relative %: 82 %
Platelets: 239 K/uL (ref 150–400)
RBC: 3.22 MIL/uL — ABNORMAL LOW (ref 3.87–5.11)
RDW: 13.2 % (ref 11.5–15.5)
WBC: 5.6 K/uL (ref 4.0–10.5)
nRBC: 0 % (ref 0.0–0.2)

## 2024-10-12 LAB — GLUCOSE, CAPILLARY
Glucose-Capillary: 137 mg/dL — ABNORMAL HIGH (ref 70–99)
Glucose-Capillary: 118 mg/dL — ABNORMAL HIGH (ref 70–99)
Glucose-Capillary: 150 mg/dL — ABNORMAL HIGH (ref 70–99)

## 2024-10-12 MED ORDER — METOPROLOL SUCCINATE ER 25 MG PO TB24: 25.0000 mg | ORAL_TABLET | Freq: Every day | ORAL | 11 refills | Status: AC

## 2024-10-12 MED ORDER — POLYETHYLENE GLYCOL 3350 17 G PO PACK: 17.0000 g | PACK | Freq: Every day | ORAL | 0 refills | Status: AC | PRN

## 2024-10-12 MED ORDER — SENNOSIDES-DOCUSATE SODIUM 8.6-50 MG PO TABS: 2.0000 | ORAL_TABLET | Freq: Two times a day (BID) | ORAL | Status: AC

## 2024-10-12 NOTE — Discharge Summary (Signed)
 Physician Discharge Summary   Patient: Stephanie Barber MRN: [id number] DOB: [birth year]  Admit date:     10/05/2024  Discharge date: 10/12/24  Discharge Physician: Elgie Butter   PCP: Sun, Vyvyan, MD   Recommendations at discharge:  Please follow up with gastroenterology for the capsule endoscopy results.  Please follow up with cbc in one week.   Discharge Diagnoses: Principal Problem:   Lower GI bleed  Resolved Problems:   * No resolved hospital problems. *  Hospital Course:    Stephanie Barber is a 83 y.o. 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  Onset night prior.  Started having abdominal cramping pain throughout the day with reported bright red blood per rectum.  Denies any antiplatelet, anticoagulant use.  Denies NSAID use.   In the ED, temperature 97.9 F, HR 62, RR 18, BP 124/84, SpO2 99% on 2 L nasal cannula.  WBC 3.5, hemoglobin 11.4, platelet count 264.  Sodium 141, potassium 4.8, chloride 107, CO2 23, glucose 65, BUN 23, creat 1.57.  AST 34, ALT 16, total bilirubin 0.3.  FOBT positive.  Interventional radiology consulted for angiogram.  GI consulted.  TRH consulted for admission for further evaluation management of lower GI bleed    Assessment and Plan:   Lower GI bleed  suspect mostly from terminal ileum History of colonic pseudoaneurysm s/p embolization. Patient presents with bright red blood per rectum.  Patient has a baseline hemoglobin around 11 slowly dropping to 7.5 TODAY.  [facility] on board and following the patient. Patient had another episode of bloody bowel movement earlier this morning. CT angiogram GI bleed study initially positive for active bleeding terminal ileum. Interventional radiology was consulted and patient underwent IR visceral angiogram with no clear evidence for active GI  bleeding from the superior mesenteric artery and ileocolic branches, and no embolization was performed. Shortly following admission, patient's heart rate and blood pressure dropped transiently with repeat hemoglobin thereafter 10.4. Repeat CT angiogram abdomen/pelvis was then performed with no acute findings to explain lower GI bleeding, noted postsurgical changes in the sigmoid and prior embolotherapy changes in the right lower quadrant.  Continue with Protonix  40 mg daily She underwent EGD showing gastritis. Capsule endoscopy done, results are pending.  Her hemoglobin post 1 unit of prbc transfusion remains stable around 9.5/ 9.9     Anemia of blood loss:  From GI bleed.  1 unit of prbc transfusion ordered. Repeat hemoglobin stable around 9.5.  Monitor .   Uvula hypertrophy and ulceration: Post EGD.  ENT consulted and recommendations given.  One dose of decadron  ordered.  Pain control.      Bradycardia and hypotension suspect from vasovagal event Appears to have resolved with IV fluid bolus.       Type 2 diabetes mellitus Patient's hemoglobin A1c is 6.4 Continue sliding scale insulin  for coverage.      Essential hypertension Blood pressure parameters are well controlled.  Her metoprolol  and Norvasc  have been held for transient bradycardia and hypotension. Continue to monitor     Hyperlipidemia Holding the statins for now     Stage IIIb CKD Baseline creatinine between 1.2-1.6 Creatinine been stable around 1.3.           Estimated body mass index is 23.23 kg/m as calculated from the following:   Height as of this encounter: 4' 11 (  1.499 m).   Weight as of this encounter: 52.2 kg.   Consultants: ENT, Gastroenterology.  Procedures performed: EGD,   Disposition: Home Diet recommendation:  Discharge Diet Orders (From admission, onward)     Start     Ordered   10/12/24 0000  Diet - low sodium heart healthy        10/12/24 1317           Regular  diet DISCHARGE MEDICATION: Allergies as of 10/12/2024       Reactions   Codeine Anaphylaxis   Morphine And Codeine Anaphylaxis   Donepezil  Diarrhea   Lactose Intolerance (gi) Diarrhea, Nausea Only   Mirabegron    Other Reaction(s): Caused her to go to the hospital   Rivaroxaban     Other Reaction(s): rectal bleeding        Medication List     STOP taking these medications    amLODipine  2.5 MG tablet Commonly known as: NORVASC        TAKE these medications    acetaminophen  500 MG tablet Commonly known as: TYLENOL  Take 500-1,000 mg by mouth See admin instructions. Take 1000mg  in the AM and 500mg  in the PM.   atorvastatin 10 MG tablet Commonly known as: LIPITOR Take 10 mg by mouth daily.   metFORMIN  500 MG tablet Commonly known as: GLUCOPHAGE  Take 500 mg by mouth daily with breakfast.   methocarbamol  500 MG tablet Commonly known as: ROBAXIN  Take 1 tablet (500 mg total) by mouth every 8 (eight) hours as needed for muscle spasms.   metoprolol  succinate 25 MG 24 hr tablet Commonly known as: Toprol  XL Take 1 tablet (25 mg total) by mouth daily. What changed:  medication strength how much to take when to take this   mirtazapine  15 MG tablet Commonly known as: REMERON  Take 15 mg by mouth at bedtime.   pantoprazole  40 MG tablet Commonly known as: Protonix  Take 1 tablet (40 mg total) by mouth daily before breakfast.   polyethylene glycol 17 g packet Commonly known as: MIRALAX  / GLYCOLAX  Take 17 g by mouth daily as needed for moderate constipation.   senna-docusate 8.6-50 MG tablet Commonly known as: Senokot-S Take 2 tablets by mouth 2 (two) times daily.        Discharge Exam: Filed Weights   10/05/24 1920 10/10/24 0927  Weight: 54.6 kg 52.2 kg   General exam: Appears calm and comfortable  Respiratory system: Clear to auscultation. Respiratory effort normal. Cardiovascular system: S1 & S2 heard, RRR.  Gastrointestinal system: Abdomen is  nondistended, soft and nontender.  Central nervous system: Alert and oriented.  Extremities: Symmetric 5 x 5 power. Skin: No rashes,  Psychiatry: Mood & affect appropriate.    Condition at discharge: fair  The results of significant diagnostics from this hospitalization (including imaging, microbiology, ancillary and laboratory) are listed below for reference.   Imaging Studies: CT Angio Abd/Pel w/ and/or w/o Result Date: 10/06/2024 EXAM: CTA ABDOMEN AND PELVIS WITHOUT AND WITH CONTRAST 10/06/2024 03:42:28 AM TECHNIQUE: CTA images of the abdomen and pelvis without and with intravenous contrast. 75 mL iohexol  (OMNIPAQUE ) 300 MG/ML solution was administered. Three-dimensional MIP/volume rendered formations were performed. Automated exposure control, iterative reconstruction, and/or weight based adjustment of the mA/kV was utilized to reduce the radiation dose to as low as reasonably achievable. COMPARISON: 10/05/2024 CLINICAL HISTORY: Lower GI bleed. FINDINGS: VASCULATURE: AORTA: Prominent chronic calcifications of the aorta are noted without aneurysmal dilatation. No dissection. CELIAC TRUNK: No acute finding. No occlusion or significant stenosis. SUPERIOR MESENTERIC  ARTERY: No acute finding. No occlusion or significant stenosis. RENAL ARTERIES: No acute finding. No occlusion or significant stenosis. ILIAC ARTERIES: No acute finding. No occlusion or significant stenosis. LIVER: The liver is within normal limits. GALLBLADDER AND BILE DUCTS: Gallbladder is within normal limits. No biliary ductal dilatation. SPLEEN: The spleen is unremarkable. PANCREAS: The pancreas is unremarkable. ADRENAL GLANDS: Bilateral adrenal glands show multiple renal cysts. No follow-up is recommended. KIDNEYS, URETERS AND BLADDER: Kidneys show multiple renal cysts. No follow-up is recommended. No stones in the kidneys or ureters. The bladder is well distended with opacified urine and is otherwise unremarkable. GI AND BOWEL:  Stomach and duodenal sweep demonstrate no acute abnormality. Small bowel is within normal limits. The area of hemorrhage in the terminal ileum is no longer identified. No obstructive or inflammatory changes of the colon are noted. Postsurgical changes in the sigmoid are seen. The appendix is within normal limits. Scattered diverticular changes are noted without inflammatory changes. No areas of pooling of contrast are noted to suggest extravasation. Some retained hyperdense material is noted within the colon related to prior contrast extravasation. There is no bowel obstruction. No abnormal bowel wall thickening or distension. REPRODUCTIVE: The uterus has been surgically removed. PERITONEUM AND RETRPERITONEUM: No ascites or free air. No free fluid is noted. LUNG BASE: Bases are free of acute infiltrate. Mild scarring is noted bilaterally. LYMPH NODES: No lymphadenopathy. BONES AND SOFT TISSUES: Osseous structures show degenerative changes of the lumbar spine. Changes of embolotherapy in the right lower quadrant is again seen. IMPRESSION: 1. No acute findings to explain lower GI bleeding. 2. Postsurgical changes in the sigmoid and prior embolotherapy changes in the right lower quadrant. 3. Colonic diverticulosis without evidence of diverticulitis. Electronically signed by: Oneil Devonshire MD 10/06/2024 04:04 AM EST RP Workstation: [id number]   IR US  Guide Vasc Access Right Result Date: 10/05/2024 INDICATION: 83 year old with acute GI bleed. CTA demonstrates bleeding at the terminal ileum. Patient had a similar area of bleeding in May 2025 and previous catheter directed coil embolization. EXAM: 1. Visceral angiography: Superior mesenteric artery 2. Visceral angiography: 2 second order branches of the superior mesenteric artery; ileocecal artery and small bowel branch 3. Visceral angiography: 2 third order branches of the superior mesenteric artery; superior ileocolic branch and inferior ileocolic branch 4. Ultrasound  guidance for vascular access MEDICATIONS: Moderate sedation ANESTHESIA/SEDATION: Moderate (conscious) sedation was employed during this procedure. A total of Versed  2 mg and Fentanyl  100 mcg was administered intravenously by the radiology nurse. Total intra-service moderate Sedation Time: 32 minutes. The patient's level of consciousness and vital signs were monitored continuously by radiology nursing throughout the procedure under my direct supervision. CONTRAST:  60 mL Omnipaque  300 FLUOROSCOPY: Radiation Exposure Index (as provided by the fluoroscopic device): 494 mGy Kerma COMPLICATIONS: None immediate. PROCEDURE: Informed consent was obtained from the patient following explanation of the procedure, risks, benefits and alternatives. The patient understands, agrees and consents for the procedure. All questions were addressed. A time out was performed prior to the initiation of the procedure. Right groin was prepped and draped in sterile fashion. Maximal barrier sterile technique was utilized including caps, mask, sterile gowns, sterile gloves, sterile drape, hand hygiene and skin antiseptic. Ultrasound confirmed a patent right common femoral artery. Ultrasound image was saved for documentation. Right groin was anesthetized with 1% lidocaine . A small incision was made. Using ultrasound guidance, 21 gauge needle was directed into the right common femoral artery and a micropuncture dilator set was placed. Five French vascular sheath  was placed over a Bentson wire. C2 catheter was used to cannulate the superior mesenteric artery. Selective SMA arteriogram was performed. STC microcatheter was advanced into the second order ileocolic branch. Selective angiography was performed of this artery. Microcatheter was used to select the superior and inferior ileocolic artery branches. Selective angiography was performed in each artery. Catheter was pulled back into the main ileocolic branch and additional angiography was  performed. Microcatheter was used to cannulate a second order branch of the SMA along the right side of the abdomen. This appeared to be a small bowel branch likely representing ileal branches. Microcatheter and C2 catheter were removed. Oblique angiography was performed through the right groin sheath. Right groin sheath was removed using Angio-Seal closure device. Right groin hemostasis at the end of the procedure. FINDINGS: Superior mesenteric artery is patent. The initial superior mesenteric arteriogram raised concern for a small focus of contrast extravasation in the expected region of the terminal ileum. Therefore, selective angiography was performed in the ileocolic artery and ileocolic artery branches. However, there was never re-demonstration of contrast extravasation, contrast staining, early venous filling or a vascular malformation. No evidence for contrast extravasation involving the right-sided small bowel arteries. IMPRESSION: 1. No clear evidence for active GI bleeding from the superior mesenteric artery and ileocolic branches. Embolization was not performed. Electronically Signed   By: Juliene Balder M.D.   On: 10/05/2024 22:26   IR Angiogram Visceral Selective Result Date: 10/05/2024 INDICATION: 83 year old with acute GI bleed. CTA demonstrates bleeding at the terminal ileum. Patient had a similar area of bleeding in May 2025 and previous catheter directed coil embolization. EXAM: 1. Visceral angiography: Superior mesenteric artery 2. Visceral angiography: 2 second order branches of the superior mesenteric artery; ileocecal artery and small bowel branch 3. Visceral angiography: 2 third order branches of the superior mesenteric artery; superior ileocolic branch and inferior ileocolic branch 4. Ultrasound guidance for vascular access MEDICATIONS: Moderate sedation ANESTHESIA/SEDATION: Moderate (conscious) sedation was employed during this procedure. A total of Versed  2 mg and Fentanyl  100 mcg was  administered intravenously by the radiology nurse. Total intra-service moderate Sedation Time: 32 minutes. The patient's level of consciousness and vital signs were monitored continuously by radiology nursing throughout the procedure under my direct supervision. CONTRAST:  60 mL Omnipaque  300 FLUOROSCOPY: Radiation Exposure Index (as provided by the fluoroscopic device): 494 mGy Kerma COMPLICATIONS: None immediate. PROCEDURE: Informed consent was obtained from the patient following explanation of the procedure, risks, benefits and alternatives. The patient understands, agrees and consents for the procedure. All questions were addressed. A time out was performed prior to the initiation of the procedure. Right groin was prepped and draped in sterile fashion. Maximal barrier sterile technique was utilized including caps, mask, sterile gowns, sterile gloves, sterile drape, hand hygiene and skin antiseptic. Ultrasound confirmed a patent right common femoral artery. Ultrasound image was saved for documentation. Right groin was anesthetized with 1% lidocaine . A small incision was made. Using ultrasound guidance, 21 gauge needle was directed into the right common femoral artery and a micropuncture dilator set was placed. Five French vascular sheath was placed over a Bentson wire. C2 catheter was used to cannulate the superior mesenteric artery. Selective SMA arteriogram was performed. STC microcatheter was advanced into the second order ileocolic branch. Selective angiography was performed of this artery. Microcatheter was used to select the superior and inferior ileocolic artery branches. Selective angiography was performed in each artery. Catheter was pulled back into the main ileocolic branch and additional angiography was  performed. Microcatheter was used to cannulate a second order branch of the SMA along the right side of the abdomen. This appeared to be a small bowel branch likely representing ileal branches.  Microcatheter and C2 catheter were removed. Oblique angiography was performed through the right groin sheath. Right groin sheath was removed using Angio-Seal closure device. Right groin hemostasis at the end of the procedure. FINDINGS: Superior mesenteric artery is patent. The initial superior mesenteric arteriogram raised concern for a small focus of contrast extravasation in the expected region of the terminal ileum. Therefore, selective angiography was performed in the ileocolic artery and ileocolic artery branches. However, there was never re-demonstration of contrast extravasation, contrast staining, early venous filling or a vascular malformation. No evidence for contrast extravasation involving the right-sided small bowel arteries. IMPRESSION: 1. No clear evidence for active GI bleeding from the superior mesenteric artery and ileocolic branches. Embolization was not performed. Electronically Signed   By: Juliene Balder M.D.   On: 10/05/2024 22:26   IR Angiogram Selective Each Additional Vessel Result Date: 10/05/2024 INDICATION: 83 year old with acute GI bleed. CTA demonstrates bleeding at the terminal ileum. Patient had a similar area of bleeding in May 2025 and previous catheter directed coil embolization. EXAM: 1. Visceral angiography: Superior mesenteric artery 2. Visceral angiography: 2 second order branches of the superior mesenteric artery; ileocecal artery and small bowel branch 3. Visceral angiography: 2 third order branches of the superior mesenteric artery; superior ileocolic branch and inferior ileocolic branch 4. Ultrasound guidance for vascular access MEDICATIONS: Moderate sedation ANESTHESIA/SEDATION: Moderate (conscious) sedation was employed during this procedure. A total of Versed  2 mg and Fentanyl  100 mcg was administered intravenously by the radiology nurse. Total intra-service moderate Sedation Time: 32 minutes. The patient's level of consciousness and vital signs were monitored  continuously by radiology nursing throughout the procedure under my direct supervision. CONTRAST:  60 mL Omnipaque  300 FLUOROSCOPY: Radiation Exposure Index (as provided by the fluoroscopic device): 494 mGy Kerma COMPLICATIONS: None immediate. PROCEDURE: Informed consent was obtained from the patient following explanation of the procedure, risks, benefits and alternatives. The patient understands, agrees and consents for the procedure. All questions were addressed. A time out was performed prior to the initiation of the procedure. Right groin was prepped and draped in sterile fashion. Maximal barrier sterile technique was utilized including caps, mask, sterile gowns, sterile gloves, sterile drape, hand hygiene and skin antiseptic. Ultrasound confirmed a patent right common femoral artery. Ultrasound image was saved for documentation. Right groin was anesthetized with 1% lidocaine . A small incision was made. Using ultrasound guidance, 21 gauge needle was directed into the right common femoral artery and a micropuncture dilator set was placed. Five French vascular sheath was placed over a Bentson wire. C2 catheter was used to cannulate the superior mesenteric artery. Selective SMA arteriogram was performed. STC microcatheter was advanced into the second order ileocolic branch. Selective angiography was performed of this artery. Microcatheter was used to select the superior and inferior ileocolic artery branches. Selective angiography was performed in each artery. Catheter was pulled back into the main ileocolic branch and additional angiography was performed. Microcatheter was used to cannulate a second order branch of the SMA along the right side of the abdomen. This appeared to be a small bowel branch likely representing ileal branches. Microcatheter and C2 catheter were removed. Oblique angiography was performed through the right groin sheath. Right groin sheath was removed using Angio-Seal closure device. Right  groin hemostasis at the end of the procedure. FINDINGS: Superior  mesenteric artery is patent. The initial superior mesenteric arteriogram raised concern for a small focus of contrast extravasation in the expected region of the terminal ileum. Therefore, selective angiography was performed in the ileocolic artery and ileocolic artery branches. However, there was never re-demonstration of contrast extravasation, contrast staining, early venous filling or a vascular malformation. No evidence for contrast extravasation involving the right-sided small bowel arteries. IMPRESSION: 1. No clear evidence for active GI bleeding from the superior mesenteric artery and ileocolic branches. Embolization was not performed. Electronically Signed   By: Juliene Balder M.D.   On: 10/05/2024 22:26   IR Angiogram Selective Each Additional Vessel Result Date: 10/05/2024 INDICATION: 83 year old with acute GI bleed. CTA demonstrates bleeding at the terminal ileum. Patient had a similar area of bleeding in May 2025 and previous catheter directed coil embolization. EXAM: 1. Visceral angiography: Superior mesenteric artery 2. Visceral angiography: 2 second order branches of the superior mesenteric artery; ileocecal artery and small bowel branch 3. Visceral angiography: 2 third order branches of the superior mesenteric artery; superior ileocolic branch and inferior ileocolic branch 4. Ultrasound guidance for vascular access MEDICATIONS: Moderate sedation ANESTHESIA/SEDATION: Moderate (conscious) sedation was employed during this procedure. A total of Versed  2 mg and Fentanyl  100 mcg was administered intravenously by the radiology nurse. Total intra-service moderate Sedation Time: 32 minutes. The patient's level of consciousness and vital signs were monitored continuously by radiology nursing throughout the procedure under my direct supervision. CONTRAST:  60 mL Omnipaque  300 FLUOROSCOPY: Radiation Exposure Index (as provided by the fluoroscopic  device): 494 mGy Kerma COMPLICATIONS: None immediate. PROCEDURE: Informed consent was obtained from the patient following explanation of the procedure, risks, benefits and alternatives. The patient understands, agrees and consents for the procedure. All questions were addressed. A time out was performed prior to the initiation of the procedure. Right groin was prepped and draped in sterile fashion. Maximal barrier sterile technique was utilized including caps, mask, sterile gowns, sterile gloves, sterile drape, hand hygiene and skin antiseptic. Ultrasound confirmed a patent right common femoral artery. Ultrasound image was saved for documentation. Right groin was anesthetized with 1% lidocaine . A small incision was made. Using ultrasound guidance, 21 gauge needle was directed into the right common femoral artery and a micropuncture dilator set was placed. Five French vascular sheath was placed over a Bentson wire. C2 catheter was used to cannulate the superior mesenteric artery. Selective SMA arteriogram was performed. STC microcatheter was advanced into the second order ileocolic branch. Selective angiography was performed of this artery. Microcatheter was used to select the superior and inferior ileocolic artery branches. Selective angiography was performed in each artery. Catheter was pulled back into the main ileocolic branch and additional angiography was performed. Microcatheter was used to cannulate a second order branch of the SMA along the right side of the abdomen. This appeared to be a small bowel branch likely representing ileal branches. Microcatheter and C2 catheter were removed. Oblique angiography was performed through the right groin sheath. Right groin sheath was removed using Angio-Seal closure device. Right groin hemostasis at the end of the procedure. FINDINGS: Superior mesenteric artery is patent. The initial superior mesenteric arteriogram raised concern for a small focus of contrast  extravasation in the expected region of the terminal ileum. Therefore, selective angiography was performed in the ileocolic artery and ileocolic artery branches. However, there was never re-demonstration of contrast extravasation, contrast staining, early venous filling or a vascular malformation. No evidence for contrast extravasation involving the right-sided small bowel arteries. IMPRESSION:  1. No clear evidence for active GI bleeding from the superior mesenteric artery and ileocolic branches. Embolization was not performed. Electronically Signed   By: Juliene Balder M.D.   On: 10/05/2024 22:26   IR Angiogram Selective Each Additional Vessel Result Date: 10/05/2024 INDICATION: 83 year old with acute GI bleed. CTA demonstrates bleeding at the terminal ileum. Patient had a similar area of bleeding in May 2025 and previous catheter directed coil embolization. EXAM: 1. Visceral angiography: Superior mesenteric artery 2. Visceral angiography: 2 second order branches of the superior mesenteric artery; ileocecal artery and small bowel branch 3. Visceral angiography: 2 third order branches of the superior mesenteric artery; superior ileocolic branch and inferior ileocolic branch 4. Ultrasound guidance for vascular access MEDICATIONS: Moderate sedation ANESTHESIA/SEDATION: Moderate (conscious) sedation was employed during this procedure. A total of Versed  2 mg and Fentanyl  100 mcg was administered intravenously by the radiology nurse. Total intra-service moderate Sedation Time: 32 minutes. The patient's level of consciousness and vital signs were monitored continuously by radiology nursing throughout the procedure under my direct supervision. CONTRAST:  60 mL Omnipaque  300 FLUOROSCOPY: Radiation Exposure Index (as provided by the fluoroscopic device): 494 mGy Kerma COMPLICATIONS: None immediate. PROCEDURE: Informed consent was obtained from the patient following explanation of the procedure, risks, benefits and  alternatives. The patient understands, agrees and consents for the procedure. All questions were addressed. A time out was performed prior to the initiation of the procedure. Right groin was prepped and draped in sterile fashion. Maximal barrier sterile technique was utilized including caps, mask, sterile gowns, sterile gloves, sterile drape, hand hygiene and skin antiseptic. Ultrasound confirmed a patent right common femoral artery. Ultrasound image was saved for documentation. Right groin was anesthetized with 1% lidocaine . A small incision was made. Using ultrasound guidance, 21 gauge needle was directed into the right common femoral artery and a micropuncture dilator set was placed. Five French vascular sheath was placed over a Bentson wire. C2 catheter was used to cannulate the superior mesenteric artery. Selective SMA arteriogram was performed. STC microcatheter was advanced into the second order ileocolic branch. Selective angiography was performed of this artery. Microcatheter was used to select the superior and inferior ileocolic artery branches. Selective angiography was performed in each artery. Catheter was pulled back into the main ileocolic branch and additional angiography was performed. Microcatheter was used to cannulate a second order branch of the SMA along the right side of the abdomen. This appeared to be a small bowel branch likely representing ileal branches. Microcatheter and C2 catheter were removed. Oblique angiography was performed through the right groin sheath. Right groin sheath was removed using Angio-Seal closure device. Right groin hemostasis at the end of the procedure. FINDINGS: Superior mesenteric artery is patent. The initial superior mesenteric arteriogram raised concern for a small focus of contrast extravasation in the expected region of the terminal ileum. Therefore, selective angiography was performed in the ileocolic artery and ileocolic artery branches. However, there was  never re-demonstration of contrast extravasation, contrast staining, early venous filling or a vascular malformation. No evidence for contrast extravasation involving the right-sided small bowel arteries. IMPRESSION: 1. No clear evidence for active GI bleeding from the superior mesenteric artery and ileocolic branches. Embolization was not performed. Electronically Signed   By: Juliene Balder M.D.   On: 10/05/2024 22:26   IR Angiogram Selective Each Additional Vessel Result Date: 10/05/2024 INDICATION: 83 year old with acute GI bleed. CTA demonstrates bleeding at the terminal ileum. Patient had a similar area of bleeding in May 2025  and previous catheter directed coil embolization. EXAM: 1. Visceral angiography: Superior mesenteric artery 2. Visceral angiography: 2 second order branches of the superior mesenteric artery; ileocecal artery and small bowel branch 3. Visceral angiography: 2 third order branches of the superior mesenteric artery; superior ileocolic branch and inferior ileocolic branch 4. Ultrasound guidance for vascular access MEDICATIONS: Moderate sedation ANESTHESIA/SEDATION: Moderate (conscious) sedation was employed during this procedure. A total of Versed  2 mg and Fentanyl  100 mcg was administered intravenously by the radiology nurse. Total intra-service moderate Sedation Time: 32 minutes. The patient's level of consciousness and vital signs were monitored continuously by radiology nursing throughout the procedure under my direct supervision. CONTRAST:  60 mL Omnipaque  300 FLUOROSCOPY: Radiation Exposure Index (as provided by the fluoroscopic device): 494 mGy Kerma COMPLICATIONS: None immediate. PROCEDURE: Informed consent was obtained from the patient following explanation of the procedure, risks, benefits and alternatives. The patient understands, agrees and consents for the procedure. All questions were addressed. A time out was performed prior to the initiation of the procedure. Right groin was  prepped and draped in sterile fashion. Maximal barrier sterile technique was utilized including caps, mask, sterile gowns, sterile gloves, sterile drape, hand hygiene and skin antiseptic. Ultrasound confirmed a patent right common femoral artery. Ultrasound image was saved for documentation. Right groin was anesthetized with 1% lidocaine . A small incision was made. Using ultrasound guidance, 21 gauge needle was directed into the right common femoral artery and a micropuncture dilator set was placed. Five French vascular sheath was placed over a Bentson wire. C2 catheter was used to cannulate the superior mesenteric artery. Selective SMA arteriogram was performed. STC microcatheter was advanced into the second order ileocolic branch. Selective angiography was performed of this artery. Microcatheter was used to select the superior and inferior ileocolic artery branches. Selective angiography was performed in each artery. Catheter was pulled back into the main ileocolic branch and additional angiography was performed. Microcatheter was used to cannulate a second order branch of the SMA along the right side of the abdomen. This appeared to be a small bowel branch likely representing ileal branches. Microcatheter and C2 catheter were removed. Oblique angiography was performed through the right groin sheath. Right groin sheath was removed using Angio-Seal closure device. Right groin hemostasis at the end of the procedure. FINDINGS: Superior mesenteric artery is patent. The initial superior mesenteric arteriogram raised concern for a small focus of contrast extravasation in the expected region of the terminal ileum. Therefore, selective angiography was performed in the ileocolic artery and ileocolic artery branches. However, there was never re-demonstration of contrast extravasation, contrast staining, early venous filling or a vascular malformation. No evidence for contrast extravasation involving the right-sided small  bowel arteries. IMPRESSION: 1. No clear evidence for active GI bleeding from the superior mesenteric artery and ileocolic branches. Embolization was not performed. Electronically Signed   By: Juliene Balder M.D.   On: 10/05/2024 22:26   CT ANGIO GI BLEED Result Date: 10/05/2024 CLINICAL DATA:  Lower GI bleed. Pseudoaneurysm in the terminal ileum. EXAM: CTA ABDOMEN AND PELVIS WITHOUT AND WITH CONTRAST TECHNIQUE: Multidetector CT imaging of the abdomen and pelvis was performed using the standard protocol during bolus administration of intravenous contrast. Multiplanar reconstructed images and MIPs were obtained and reviewed to evaluate the vascular anatomy. RADIATION DOSE REDUCTION: This exam was performed according to the departmental dose-optimization program which includes automated exposure control, adjustment of the mA and/or kV according to patient size and/or use of iterative reconstruction technique. CONTRAST:  OMNIPAQUE   IOHEXOL  350 MG/ML SOLN COMPARISON:  CT abdomen pelvis dated 04/06/2024. FINDINGS: VASCULAR Aorta: Mild atherosclerotic calcification of the abdominal aorta. No dilatation or dissection. No periaortic fluid collection. Celiac: Patent without evidence of aneurysm, dissection, vasculitis or significant stenosis. SMA: Patent without evidence of aneurysm, dissection, vasculitis or significant stenosis. Renals: Both renal arteries are patent without evidence of aneurysm, dissection, vasculitis, fibromuscular dysplasia or significant stenosis. IMA: Patent without evidence of aneurysm, dissection, vasculitis or significant stenosis. Inflow: Mild atherosclerotic calcification of the iliac arteries. No aneurysmal dilatation or dissection. The iliac arteries are patent. Proximal Outflow: The visualized proximal fluid patent. Veins: The IVC is unremarkable. The SMV, splenic vein, and main portal vein are patent. No portal venous gas. Review of the MIP images confirms the above findings. NON-VASCULAR  Lower chest: The visualized lung bases are clear. No intra-abdominal free air or free fluid. Hepatobiliary: No focal liver abnormality is seen. No gallstones, gallbladder wall thickening, or biliary dilatation. Pancreas: Unremarkable. No pancreatic ductal dilatation or surrounding inflammatory changes. Spleen: Normal in size without focal abnormality. Adrenals/Urinary Tract: The adrenal glands unremarkable. Bilateral renal parenchyma atrophy and cortical scarring. There is an 8 mm by stone in the upper pole of the left kidney. A 2 mm stone in the interpolar right kidney. No hydronephrosis on either side. Small bilateral renal cysts. The visualized ureters and urinary bladder appear unremarkable. Stomach/Bowel: Postsurgical changes of the rectosigmoid with anastomotic staple line. There is colonic diverticulosis. There is no bowel obstruction or active inflammation. Progressive contrast pooling in the terminal ileum on postcontrast images consistent with active GI bleed. The appendix is normal. Lymphatic: No adenopathy. Reproductive: Hysterectomy.  No suspicious adnexal masses. Other: None Musculoskeletal: Degenerative changes of the spine and osteopenia. No acute osseous pathology. IMPRESSION: 1. Active GI bleed in the terminal ileum. 2. Colonic diverticulosis. No bowel obstruction. Normal appendix. 3. Bilateral nonobstructing renal calculi. No hydronephrosis. 4.  Aortic Atherosclerosis (ICD10-I70.0). Electronically Signed   By: Vanetta Chou M.D.   On: 10/05/2024 16:45   XR Toe 5th Right Result Date: 09/23/2024 X-rays of her right fifth toe do not demonstrate any acute fractures or dislocation   Microbiology: Results for orders placed or performed during the hospital encounter of 10/05/24  MRSA Next Gen by PCR, Nasal     Status: None   Collection Time: 10/06/24  6:13 PM   Specimen: Nasal Mucosa; Nasal Swab  Result Value Ref Range Status   MRSA by PCR Next Gen NOT DETECTED NOT DETECTED Final     Comment: (NOTE) The GeneXpert MRSA Assay (FDA approved for NASAL specimens only), is one component of a comprehensive MRSA colonization surveillance program. It is not intended to diagnose MRSA infection nor to guide or monitor treatment for MRSA infections. Test performance is not FDA approved in patients less than 46 years old. Performed at [facility], [address] [place], [place] [address]     Labs: CBC: Recent Labs  Lab 10/08/24 [phone] 10/08/24 1212 10/09/24 0338 10/09/24 1709 10/10/24 0335 10/11/24 0254 10/12/24 0541  WBC 4.8  --  3.7*  --  4.8 6.5 5.6  NEUTROABS  --   --  2.4  --  3.2 5.1 4.6  HGB 8.8*   < > 7.5* 9.7* 9.5* 9.8* 9.5*  HCT 28.2*   < > 23.7* 29.7* 29.1* 30.6* 29.0*  MCV 94.9  --  94.4  --  91.5 91.3 90.1  PLT 201  --  186  --  177 206 239   < > =  values in this interval not displayed.   Basic Metabolic Panel: Recent Labs  Lab 10/06/24 0203 10/07/24 0324 10/08/24 0349 10/09/24 0338 10/11/24 0254  NA 142 141 142 143 141  K 4.0 4.1 4.2 4.2 4.0  CL 108 111 112* 113* 106  CO2 19* 21* 20* 22 22  GLUCOSE 189* 100* 145* 106* 93  BUN 22 21 17 22 17   CREATININE 1.25* 1.34* 1.33* 1.43* 1.42*  CALCIUM  9.0 8.8* 8.7* 9.4 9.2  MG 1.7  --   --   --   --    Liver Function Tests: No results for input(s): AST, ALT, ALKPHOS, BILITOT, PROT, ALBUMIN in the last 168 hours. CBG: Recent Labs  Lab 10/11/24 2015 10/11/24 2356 10/12/24 0417 10/12/24 0730 10/12/24 1217  GLUCAP 347* 119* 137* 118* 150*    Discharge time spent: 31 minutes.  Signed: Elgie Butter, MD [facility] 10/12/2024

## 2024-10-12 NOTE — TOC Transition Note (Signed)
 Transition of Care [facility]) - Discharge Note   Patient Details  Name: Stephanie Barber MRN: [id number] Date of Birth: 24-Jun-1941  Transition of Care [facility] CM/SW Contact:  Sonda Manuella Quill, RN Phone Number: 10/12/2024, 2:02 PM   Clinical Narrative:    D/C orders received; no IP CM needs.   Final next level of care: Home/Self Care Barriers to Discharge: No Barriers Identified   Patient Goals and CMS Choice Patient states their goals for this hospitalization and ongoing recovery are:: To return home with spouse CMS Medicare.gov Compare Post Acute Care list provided to:: Patient Choice offered to / list presented to : Patient [place] ownership interest in [facility].provided to:: Patient    Discharge Placement                       Discharge Plan and Services Additional resources added to the After Visit Summary for   In-house Referral: NA Discharge Planning Services: CM Consult Post Acute Care Choice: Durable Medical Equipment          DME Arranged: N/A DME Agency: NA       HH Arranged: NA HH Agency: NA        Social Drivers of Health (SDOH) Interventions SDOH Screenings   Food Insecurity: No Food Insecurity (10/05/2024)  Housing: Low Risk  (10/05/2024)  Transportation Needs: No Transportation Needs (10/05/2024)  Utilities: Not At Risk (10/05/2024)  Social Connections: Socially Integrated (10/05/2024)  Tobacco Use: Medium Risk (10/10/2024)     Readmission Risk Interventions    10/07/2024    4:06 PM  Readmission Risk Prevention Plan  Transportation Screening Complete  PCP or Specialist Appt within 5-7 Days Complete  Home Care Screening Complete  Medication Review (RN CM) Complete

## 2024-10-15 ENCOUNTER — Ambulatory Visit: Admitting: Family

## 2024-10-15 ENCOUNTER — Encounter: Payer: Self-pay | Admitting: Family

## 2024-10-15 DIAGNOSIS — M79674 Pain in right toe(s): Secondary | ICD-10-CM | POA: Diagnosis not present

## 2024-10-15 NOTE — Progress Notes (Signed)
 Office Visit Note   Patient: Stephanie Barber           Date of Birth: June 05, 1941           MRN: [id number] Visit Date: 10/15/2024              Requested by: Sun, Vyvyan, MD [phone] [address] [place],  [place] [address] PCP: Austin Nutley, MD  Chief Complaint  Patient presents with   Right Foot - Pain      HPI: The patient is an 83 year old woman who presents in follow-up for right fifth toe pain.  Was seen in the office about 3 weeks ago for pain in the fourth webspace appeared to be from rubbing and pressure of the fifth toe against the fourth toe she was given a silicone sleeve and discussed wide shoe wear.  She and family report she had a painful corn.  She is not having any issues any longer no toe pain no foot pain is ambulating with her rolling walker without any issues  Assessment & Plan: Visit Diagnoses: No diagnosis found.  Plan: Plan to follow-up in the office as needed she will continue offloading the fifth toe/fourth webspace follow-up in the office as needed  Follow-Up Instructions: No follow-ups on file.   Ortho Exam  Patient is alert, oriented, no adenopathy, well-dressed, normal affect, normal respiratory effort. On examination right foot the fourth toe is straight there is no hyperkeratotic tissue buildup it is overall nontender there is no skin breakdown in the fourth webspace.     Imaging: No results found. No images are attached to the encounter.  Labs: Lab Results  Component Value Date   HGBA1C 6.4 (H) 10/05/2024   HGBA1C 5.8 (H) 12/21/2022   HGBA1C 5.8 (H) 09/22/2022   ESRSEDRATE 3 06/30/2019   ESRSEDRATE 78 (H) 01/30/2017   CRP <1 06/30/2019   REPTSTATUS 09/21/2022 FINAL 09/20/2022   CULT (A) 09/20/2022    <10,000 COLONIES/mL INSIGNIFICANT GROWTH Performed at [facility], [address], [place], [place] [address]    LABORGA PROTEUS MIRABILIS (A) 01/30/2017   LABORGA ENTEROCOCCUS FAECALIS (A) 01/30/2017     Lab  Results  Component Value Date   ALBUMIN 4.1 10/05/2024   ALBUMIN 4.0 04/05/2024   ALBUMIN 4.3 06/06/2023    Lab Results  Component Value Date   MG 1.7 10/06/2024   MG 2.0 12/13/2022   MG 2.3 09/23/2022   No results found for: VD25OH  No results found for: PREALBUMIN    Latest Ref Rng & Units 10/12/2024    5:41 AM 10/11/2024    2:54 AM 10/10/2024    3:35 AM  CBC EXTENDED  WBC 4.0 - 10.5 K/uL 5.6  6.5  4.8   RBC 3.87 - 5.11 MIL/uL 3.22  3.35  3.18   Hemoglobin 12.0 - 15.0 g/dL 9.5  9.8  9.5   HCT 63.9 - 46.0 % 29.0  30.6  29.1   Platelets 150 - 400 K/uL 239  206  177   NEUT# 1.7 - 7.7 K/uL 4.6  5.1  3.2   Lymph# 0.7 - 4.0 K/uL 0.6  0.8  0.9      There is no height or weight on file to calculate BMI.  Orders:  No orders of the defined types were placed in this encounter.  No orders of the defined types were placed in this encounter.    Procedures: No procedures performed  Clinical Data: No additional findings.  ROS:  All other systems negative, except as noted in the HPI. Review of Systems  Objective: Vital Signs: There were no vitals taken for this visit.  Specialty Comments:  MRI LUMBAR SPINE WITHOUT CONTRAST   TECHNIQUE: Multiplanar, multisequence MR imaging of the lumbar spine was performed. No intravenous contrast was administered.   COMPARISON:  MRI 08/29/2021.  Radiography 05/14/2024.   FINDINGS: Segmentation:  5 lumbar type vertebral bodies.   Alignment:  1-2 mm anterolisthesis L2-3.   Vertebrae: No fracture or focal bone lesion. No edematous endplate marrow changes. Some edema/effusions associated with the facet joints at L2-3.   Conus medullaris and cauda equina: Conus extends to the L1 level. Conus and cauda equina appear normal.   Paraspinal and other soft tissues: Negative   Disc levels:   No significant finding from T11-12 through L1-2.   L2-3: Bulging of the disc. Bilateral facet arthropathy with facet and ligamentous  hypertrophy. Small joint effusions and joint edema. 1 mm of anterolisthesis which could worsen with standing or flexion. Findings at this level could certainly relate to back pain or referred facet syndrome pain. There is mild stenosis of both lateral recesses. Definite neural compression is not established in this position but the appearance could worsen with standing or flexion.   L3-4: Bulging of the disc. Bilateral facet degeneration and hypertrophy. Mild multifactorial stenosis. Definite neural compression is not established.   L4-5: Endplate osteophytes and bulging of the disc. Previous posterior decompression. Sufficient patency of the central canal. Mild stenosis of the lateral recesses and foramina but no visible neural compression.   L5-S1: Mild bulging of the disc. Bilateral facet degeneration and hypertrophy. No compressive stenosis.   Findings at L2-3 and L3-4 of worsened slightly since 2022.   IMPRESSION: 1. L2-3: Bulging of the disc. Bilateral facet arthropathy with facet and ligamentous hypertrophy. Small joint effusions and joint edema. 1 mm of anterolisthesis which could worsen with standing or flexion. Findings at this level could relate to back pain or referred facet syndrome pain. Mild stenosis of both lateral recesses but no visible neural compression in this position. 2. L3-4: Bulging of the disc. Bilateral facet degeneration and hypertrophy. Mild multifactorial stenosis but no visible neural compression. 3. L4-5: Previous posterior decompression. Sufficient patency of the central canal. Mild stenosis of the lateral recesses and foramina but no visible neural compression. 4. L5-S1: Disc bulge. Facet degeneration and hypertrophy. No compressive stenosis.     Electronically Signed   By: Oneil Officer M.D.   On: 05/27/2024 13:40  PMFS History: Patient Active Problem List   Diagnosis Date Noted   Lower GI bleed 10/05/2024   Torn ear lobe 04/15/2024    Acute lower GI bleeding 04/05/2024   Torn earlobe 01/11/2024   Impingement syndrome of left shoulder 10/31/2022   Other spondylosis with radiculopathy, cervical region 10/12/2022   GIB (gastrointestinal bleeding) 09/20/2022   Pancreatic insufficiency 09/20/2022   GI bleeding 07/23/2022   Stage 3b chronic kidney disease (CKD) (HCC)    Other spondylosis with radiculopathy, lumbar region 09/19/2021   IDA (iron  deficiency anemia) 91/75/7977   Protein-calorie malnutrition, severe 06/21/2021   Acute blood loss anemia    Malnutrition of moderate degree 06/16/2021   Hematochezia 06/15/2021   Acute GI bleeding 06/15/2021   Hyperlipidemia associated with type 2 diabetes mellitus (HCC) 06/15/2021   Left-sided headache 06/30/2019   Memory loss 06/30/2019   AKI (acute kidney injury) 05/06/2017   Dehydration 05/06/2017   Chest pain 05/06/2017   Wound infection  after surgery 01/30/2017   Palpitations 08/24/2016   Noncompliance with CPAP treatment 12/28/2015   Poor compliance with CPAP treatment 10/26/2014   CPAP use counseling 07/28/2014   OSA on CPAP 07/28/2014   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   Hypersomnolent 04/02/2014   Obstructive sleep apnea 04/02/2014   Non-compliance with treatment 03/05/2014   Type 2 diabetes mellitus (HCC) 12/05/2011   Hypertension 12/05/2011   GERD (gastroesophageal reflux disease)    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   Chronic cough 07/26/2011   Past Medical History:  Diagnosis Date   Arthritis    Chronic headache    Complication of anesthesia    allergy to Novocaine    Diabetes mellitus    GERD (gastroesophageal reflux disease)    GI bleed    Heart murmur    High cholesterol    Hyperlipidemia    Hypertension    Intracranial atherosclerosis    per MRA   Migraine    Narcolepsy and cataplexy    OSA    states has not used CPAP in over a year   Peripheral vascular disease    Rectal prolapse    Stage 3b chronic kidney disease (CKD)  (HCC)    Trigger finger of all digits of right hand     Family History  Problem Relation Age of Onset   Heart disease Mother    Throat cancer Father    Cancer Father        stomach   Kidney cancer Brother    Heart disease Brother     Past Surgical History:  Procedure Laterality Date   ABDOMINAL HYSTERECTOMY     Fibroids   BACK SURGERY     L4,L5 discectomy   BOTOX  INJECTION N/A 01/02/2023   Procedure: BOTOX  INJECTION;  Surgeon: Elisabeth Valli BIRCH, MD;  Location: [facility];  Service: Urology;  Laterality: N/A;   CARDIAC CATHETERIZATION     CARPAL TUNNEL RELEASE Right 01/18/2017   COLONOSCOPY N/A 07/28/2022   Procedure: COLONOSCOPY;  Surgeon: Rosalie Kitchens, MD;  Location: [facility];  Service: Gastroenterology;  Laterality: N/A;   COLONOSCOPY WITH PROPOFOL  N/A 06/17/2021   Procedure: COLONOSCOPY WITH PROPOFOL ;  Surgeon: Dianna Specking, MD;  Location: [facility];  Service: Endoscopy;  Laterality: N/A;   CYSTOSCOPY WITH INJECTION N/A 01/02/2023   Procedure: CYSTOSCOPY WITH LUEVENIA;  Surgeon: Elisabeth Valli BIRCH, MD;  Location: [facility];  Service: Urology;  Laterality: N/A;  45 MINS   ESOPHAGOGASTRODUODENOSCOPY N/A 10/10/2024   Procedure: EGD (ESOPHAGOGASTRODUODENOSCOPY);  Surgeon: Dianna Specking, MD;  Location: [facility];  Service: Gastroenterology;  Laterality: N/A;   ESOPHAGOGASTRODUODENOSCOPY (EGD) WITH PROPOFOL  N/A 06/17/2021   Procedure: ESOPHAGOGASTRODUODENOSCOPY (EGD) WITH PROPOFOL ;  Surgeon: Dianna Specking, MD;  Location: [facility];  Service: Endoscopy;  Laterality: N/A;   GIVENS CAPSULE STUDY N/A 10/10/2024   Procedure: IMAGING PROCEDURE, GI TRACT, INTRALUMINAL, VIA CAPSULE;  Surgeon: Dianna Specking, MD;  Location: [facility];  Service: Gastroenterology;  Laterality: N/A;   IR ANGIOGRAM SELECTIVE EACH ADDITIONAL VESSEL  04/06/2024   IR ANGIOGRAM SELECTIVE EACH ADDITIONAL VESSEL  04/06/2024   IR ANGIOGRAM SELECTIVE EACH ADDITIONAL VESSEL  04/06/2024   IR ANGIOGRAM  SELECTIVE EACH ADDITIONAL VESSEL  04/06/2024   IR ANGIOGRAM SELECTIVE EACH ADDITIONAL VESSEL  04/06/2024   IR ANGIOGRAM SELECTIVE EACH ADDITIONAL VESSEL  04/06/2024   IR ANGIOGRAM SELECTIVE EACH ADDITIONAL VESSEL  10/05/2024   IR ANGIOGRAM SELECTIVE EACH ADDITIONAL VESSEL  10/05/2024   IR ANGIOGRAM SELECTIVE EACH ADDITIONAL VESSEL  10/05/2024   IR ANGIOGRAM SELECTIVE  EACH ADDITIONAL VESSEL  10/05/2024   IR ANGIOGRAM VISCERAL SELECTIVE  04/06/2024   IR ANGIOGRAM VISCERAL SELECTIVE  10/05/2024   IR EMBO ART  VEN HEMORR LYMPH EXTRAV  INC GUIDE ROADMAPPING  04/06/2024   IR US  GUIDE VASC ACCESS RIGHT  04/06/2024   IR US  GUIDE VASC ACCESS RIGHT  10/05/2024   KIDNEY SURGERY  1998   Right.  growth removed   POLYPECTOMY  06/17/2021   Procedure: POLYPECTOMY;  Surgeon: Dianna Specking, MD;  Location: [facility];  Service: Endoscopy;;   POLYPECTOMY  07/28/2022   Procedure: POLYPECTOMY;  Surgeon: Rosalie Kitchens, MD;  Location: [facility];  Service: Gastroenterology;;   rectal prolapse repair  12/05/2011   ROTATOR CUFF REPAIR     Left   SHOULDER ARTHROSCOPY WITH SUBACROMIAL DECOMPRESSION, ROTATOR CUFF REPAIR AND BICEP TENDON REPAIR Left 05/25/2015   Procedure: SHOULDER ARTHROSCOPY WITH SUBACROMIAL DECOMPRESSION, ROTATOR CUFF REPAIR AND BICEP TENODESIS, DEBRIDEMENT. ;  Surgeon: Glendia Cordella Hutchinson, MD;  Location: [facility];  Service: Orthopedics;  Laterality: Left;  LEFT SHOULDER ROTATOR CUFF TEAR REPAIR, ARTHROSCOPY, DEBRIDEMENT, BICEPS TENODESIS, SUBACROMIAL DECOMPRESSION.   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  Tobacco Use   Smoking status: Former    Current  packs/day: 0.00    Types: Cigarettes    Quit date: 11/13/1980    Years since quitting: 43.9   Smokeless tobacco: Never   Tobacco comments:    quit in 1982  Vaping Use   Vaping status: Never Used  Substance and Sexual Activity   Alcohol  use: No    Alcohol /week: 0.0 standard drinks of alcohol    Drug use: No   Sexual activity: Yes

## 2024-10-26 NOTE — Progress Notes (Unsigned)
 Stephanie Barber - 83 y.o. female MRN [id number]  Date of birth: 04/12/1941  Office Visit Note: Visit Date: 10/27/2024 PCP: Sun, Vyvyan, MD Referred by: Sun, Vyvyan, MD  Subjective: No chief complaint on file.  HPI: Stephanie Barber is a pleasant 83 y.o. female who returns today for follow-up of right index finger trigger digit.  She underwent injection approximately 6 weeks prior to the A1 pulley region.  She is diabetic, well-controlled A1c most recent 5.8.  She got excellent relief from the recent trigger injection, clicking and locking has resolved.  Pertinent ROS were reviewed with the patient and found to be negative unless otherwise specified above in HPI.     Assessment & Plan: Visit Diagnoses:  1. Trigger finger, right index finger      Plan: She has done very well after the recent cortisone injection to the right index finger A1 pulley region.  She can continue with activities as tolerated moving forward and return to me in the future should symptoms recur.   Follow-up: No follow-ups on file.   Meds & Orders: No orders of the defined types were placed in this encounter.   No orders of the defined types were placed in this encounter.    Procedures: No procedures performed      Clinical History: MRI LUMBAR SPINE WITHOUT CONTRAST   TECHNIQUE: Multiplanar, multisequence MR imaging of the lumbar spine was performed. No intravenous contrast was administered.   COMPARISON:  MRI 08/29/2021.  Radiography 05/14/2024.   FINDINGS: Segmentation:  5 lumbar type vertebral bodies.   Alignment:  1-2 mm anterolisthesis L2-3.   Vertebrae: No fracture or focal bone lesion. No edematous endplate marrow changes. Some edema/effusions associated with the facet joints at L2-3.   Conus medullaris and cauda equina: Conus extends to the L1 level. Conus and cauda equina appear normal.   Paraspinal and other soft tissues: Negative   Disc levels:   No significant  finding from T11-12 through L1-2.   L2-3: Bulging of the disc. Bilateral facet arthropathy with facet and ligamentous hypertrophy. Small joint effusions and joint edema. 1 mm of anterolisthesis which could worsen with standing or flexion. Findings at this level could certainly relate to back pain or referred facet syndrome pain. There is mild stenosis of both lateral recesses. Definite neural compression is not established in this position but the appearance could worsen with standing or flexion.   L3-4: Bulging of the disc. Bilateral facet degeneration and hypertrophy. Mild multifactorial stenosis. Definite neural compression is not established.   L4-5: Endplate osteophytes and bulging of the disc. Previous posterior decompression. Sufficient patency of the central canal. Mild stenosis of the lateral recesses and foramina but no visible neural compression.   L5-S1: Mild bulging of the disc. Bilateral facet degeneration and hypertrophy. No compressive stenosis.   Findings at L2-3 and L3-4 of worsened slightly since 2022.   IMPRESSION: 1. L2-3: Bulging of the disc. Bilateral facet arthropathy with facet and ligamentous hypertrophy. Small joint effusions and joint edema. 1 mm of anterolisthesis which could worsen with standing or flexion. Findings at this level could relate to back pain or referred facet syndrome pain. Mild stenosis of both lateral recesses but no visible neural compression in this position. 2. L3-4: Bulging of the disc. Bilateral facet degeneration and hypertrophy. Mild multifactorial stenosis but no visible neural compression. 3. L4-5: Previous posterior decompression. Sufficient patency of the central canal. Mild stenosis of the lateral recesses and foramina but no visible neural compression.  4. L5-S1: Disc bulge. Facet degeneration and hypertrophy. No compressive stenosis.     Electronically Signed   By: Oneil Officer M.D.   On: 05/27/2024 13:40  She  reports that she quit smoking about 43 years ago. Her smoking use included cigarettes. She has never used smokeless tobacco.  Recent Labs    10/05/24 1927  HGBA1C 6.4*    Objective:   Vital Signs: There were no vitals taken for this visit.  Physical Exam  Gen: Well-appearing, in no acute distress; non-toxic CV: Regular Rate. Well-perfused. Warm.  Resp: Breathing unlabored on room air; no wheezing. Psych: Fluid speech in conversation; appropriate affect; normal thought process  Ortho Exam Right hand: - No significant nodule at the A1 pulley of the index finger, no tenderness - No observable clicking with deep flexion of the index finger, no evidence of locking with deep flexion - Sensation intact distally, hand remains warm well-perfused    Imaging: No results found.  Past Medical/Family/Surgical/Social History: Medications & Allergies reviewed per EMR, new medications updated. Patient Active Problem List   Diagnosis Date Noted   Lower GI bleed 10/05/2024   Torn ear lobe 04/15/2024   Acute lower GI bleeding 04/05/2024   Torn earlobe 01/11/2024   Impingement syndrome of left shoulder 10/31/2022   Other spondylosis with radiculopathy, cervical region 10/12/2022   GIB (gastrointestinal bleeding) 09/20/2022   Pancreatic insufficiency 09/20/2022   GI bleeding 07/23/2022   Stage 3b chronic kidney disease (CKD) (HCC)    Other spondylosis with radiculopathy, lumbar region 09/19/2021   IDA (iron  deficiency anemia) 07/06/2021   Protein-calorie malnutrition, severe 06/21/2021   Acute blood loss anemia    Malnutrition of moderate degree 06/16/2021   Hematochezia 06/15/2021   Acute GI bleeding 06/15/2021   Hyperlipidemia associated with type 2 diabetes mellitus (HCC) 06/15/2021   Left-sided headache 06/30/2019   Memory loss 06/30/2019   AKI (acute kidney injury) 05/06/2017   Dehydration 05/06/2017   Chest pain 05/06/2017   Wound infection after surgery 01/30/2017    Palpitations 08/24/2016   Noncompliance with CPAP treatment 12/28/2015   Poor compliance with CPAP treatment 10/26/2014   CPAP use counseling 07/28/2014   OSA on CPAP 07/28/2014   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   Hypersomnolent 04/02/2014   Obstructive sleep apnea 04/02/2014   Non-compliance with treatment 03/05/2014   Type 2 diabetes mellitus (HCC) 12/05/2011   Hypertension 12/05/2011   GERD (gastroesophageal reflux disease)    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   Chronic cough 07/26/2011   Past Medical History:  Diagnosis Date   Arthritis    Chronic headache    Complication of anesthesia    allergy to Novocaine    Diabetes mellitus    GERD (gastroesophageal reflux disease)    GI bleed    Heart murmur    High cholesterol    Hyperlipidemia    Hypertension    Intracranial atherosclerosis    per MRA   Migraine    Narcolepsy and cataplexy    OSA    states has not used CPAP in over a year   Peripheral vascular disease    Rectal prolapse    Stage 3b chronic kidney disease (CKD) (HCC)    Trigger finger of all digits of right hand    Family History  Problem Relation Age of Onset   Heart disease Mother    Throat cancer Father    Cancer Father        stomach   Kidney cancer Brother  Heart disease Brother    Past Surgical History:  Procedure Laterality Date   ABDOMINAL HYSTERECTOMY     Fibroids   BACK SURGERY     L4,L5 discectomy   BOTOX  INJECTION N/A 01/02/2023   Procedure: BOTOX  INJECTION;  Surgeon: Elisabeth Valli BIRCH, MD;  Location: [facility];  Service: Urology;  Laterality: N/A;   CARDIAC CATHETERIZATION     CARPAL TUNNEL RELEASE Right 01/18/2017   COLONOSCOPY N/A 07/28/2022   Procedure: COLONOSCOPY;  Surgeon: Rosalie Kitchens, MD;  Location: [facility];  Service: Gastroenterology;  Laterality: N/A;   COLONOSCOPY WITH PROPOFOL  N/A 06/17/2021   Procedure: COLONOSCOPY WITH PROPOFOL ;  Surgeon: Dianna Specking, MD;  Location: [facility];  Service:  Endoscopy;  Laterality: N/A;   CYSTOSCOPY WITH INJECTION N/A 01/02/2023   Procedure: CYSTOSCOPY WITH LUEVENIA;  Surgeon: Elisabeth Valli BIRCH, MD;  Location: [facility];  Service: Urology;  Laterality: N/A;  45 MINS   ESOPHAGOGASTRODUODENOSCOPY N/A 10/10/2024   Procedure: EGD (ESOPHAGOGASTRODUODENOSCOPY);  Surgeon: Dianna Specking, MD;  Location: [facility];  Service: Gastroenterology;  Laterality: N/A;   ESOPHAGOGASTRODUODENOSCOPY (EGD) WITH PROPOFOL  N/A 06/17/2021   Procedure: ESOPHAGOGASTRODUODENOSCOPY (EGD) WITH PROPOFOL ;  Surgeon: Dianna Specking, MD;  [facility];  Service: Endoscopy;  Laterality: N/A;   GIVENS CAPSULE STUDY N/A 10/10/2024   Procedure: IMAGING PROCEDURE, GI TRACT, INTRALUMINAL, VIA CAPSULE;  Surgeon: Dianna Specking, MD;  [facility]: [facility];  Service: Gastroenterology;  Laterality: N/A;   IR ANGIOGRAM SELECTIVE EACH ADDITIONAL VESSEL  04/06/2024   IR ANGIOGRAM SELECTIVE EACH ADDITIONAL VESSEL  04/06/2024   IR ANGIOGRAM SELECTIVE EACH ADDITIONAL VESSEL  04/06/2024   IR ANGIOGRAM SELECTIVE EACH ADDITIONAL VESSEL  04/06/2024   IR ANGIOGRAM SELECTIVE EACH ADDITIONAL VESSEL  04/06/2024   IR ANGIOGRAM SELECTIVE EACH ADDITIONAL VESSEL  04/06/2024   IR ANGIOGRAM SELECTIVE EACH ADDITIONAL VESSEL  10/05/2024   IR ANGIOGRAM SELECTIVE EACH ADDITIONAL VESSEL  10/05/2024   IR ANGIOGRAM SELECTIVE EACH ADDITIONAL VESSEL  10/05/2024   IR ANGIOGRAM SELECTIVE EACH ADDITIONAL VESSEL  10/05/2024   IR ANGIOGRAM VISCERAL SELECTIVE  04/06/2024   IR ANGIOGRAM VISCERAL SELECTIVE  10/05/2024   IR EMBO ART  VEN HEMORR LYMPH EXTRAV  INC GUIDE ROADMAPPING  04/06/2024   IR US  GUIDE VASC ACCESS RIGHT  04/06/2024   IR US  GUIDE VASC ACCESS RIGHT  10/05/2024   KIDNEY SURGERY  1998   Right.  growth removed   POLYPECTOMY  06/17/2021   Procedure: POLYPECTOMY;  Surgeon: Dianna Specking, MD;  [facility]: [facility];  Service: Endoscopy;;   POLYPECTOMY  07/28/2022   Procedure: POLYPECTOMY;   Surgeon: Rosalie Kitchens, MD;  Location: [facility];  Service: Gastroenterology;;   rectal prolapse repair  12/05/2011   ROTATOR CUFF REPAIR     Left   SHOULDER ARTHROSCOPY WITH SUBACROMIAL DECOMPRESSION, ROTATOR CUFF REPAIR AND BICEP TENDON REPAIR Left 05/25/2015   Procedure: SHOULDER ARTHROSCOPY WITH SUBACROMIAL DECOMPRESSION, ROTATOR CUFF REPAIR AND BICEP TENODESIS, DEBRIDEMENT. ;  Surgeon: Glendia Cordella Hutchinson, MD;  Location: [facility];  Service: Orthopedics;  Laterality: Left;  LEFT SHOULDER ROTATOR CUFF TEAR REPAIR, ARTHROSCOPY, DEBRIDEMENT, BICEPS TENODESIS, SUBACROMIAL DECOMPRESSION.   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  Tobacco Use   Smoking status: Former    Current packs/day: 0.00    Types: Cigarettes    Quit date: 11/13/1980    Years since quitting: 43.9   Smokeless tobacco: Never   Tobacco comments:    quit in 1982  Vaping Use   Vaping status: Never Used  Substance and Sexual Activity   Alcohol  use: No    Alcohol /week: 0.0 standard drinks of alcohol    Drug use: No   Sexual activity: Yes    Ajna Moors Afton Alderton, M.D. [facility], Hand Surgery

## 2024-10-27 ENCOUNTER — Ambulatory Visit: Admitting: Orthopedic Surgery

## 2024-10-27 DIAGNOSIS — M65321 Trigger finger, right index finger: Secondary | ICD-10-CM

## 2024-11-17 ENCOUNTER — Other Ambulatory Visit: Payer: Self-pay | Admitting: Adult Health

## 2024-11-17 DIAGNOSIS — F331 Major depressive disorder, recurrent, moderate: Secondary | ICD-10-CM

## 2024-11-17 NOTE — Telephone Encounter (Signed)
 Hi,   Can someone please contact the patient to confirm whether they plan to continue care at our practice. She was supposed  to return in February 2025 to see Tillman and had also been seeing Debbie.  Thank you

## 2024-11-19 ENCOUNTER — Other Ambulatory Visit: Payer: Self-pay | Admitting: Adult Health

## 2024-11-19 DIAGNOSIS — F331 Major depressive disorder, recurrent, moderate: Secondary | ICD-10-CM

## 2024-11-19 NOTE — Telephone Encounter (Signed)
 Please call to see if is going to still follow with CR. Has not been seen in a year.

## 2024-11-24 ENCOUNTER — Telehealth: Payer: Self-pay | Admitting: Neurology

## 2024-11-24 ENCOUNTER — Encounter: Payer: Self-pay | Admitting: Neurology

## 2024-11-24 ENCOUNTER — Ambulatory Visit: Admitting: Neurology

## 2024-11-24 VITALS — BP 140/78 | HR 87 | Ht 59.0 in | Wt 120.5 lb

## 2024-11-24 DIAGNOSIS — G309 Alzheimer's disease, unspecified: Secondary | ICD-10-CM

## 2024-11-24 DIAGNOSIS — F03B3 Unspecified dementia, moderate, with mood disturbance: Secondary | ICD-10-CM | POA: Insufficient documentation

## 2024-11-24 NOTE — Progress Notes (Signed)
 "  Chief Complaint  Patient presents with   New Patient (Initial Visit)    Pt in room 14. Husband in room.Paper referral for dementia. MOCA:15.      ASSESSMENT AND PLAN  Stephanie Barber is a 84 y.o. female   Dementia  Differentiation diagnosis include central nervous system degenerative disorder versus vascular component  MoCA 15/30, significant short-term memory issue  MRI of the brain to rule out structural abnormality  Laboratory evaluation to rule out treatable etiology, discussed with patient and her husband, agree with ATN profile Advise regular sleep pattern, moderate exercise  Return To Clinic With NP In 6 Months     DIAGNOSTIC DATA (LABS, IMAGING, TESTING) - I reviewed patient records, labs, notes, testing and imaging myself where available.   MEDICAL HISTORY:  Stephanie Barber is a 84 year old female, accompanied by her husband, seen in request by   her primary care doctor sun, Vyvyan, for evaluation of memory loss, initial evaluation November 24, 2024  History is obtained from the patient and review of electronic medical records. I personally reviewed pertinent available imaging films in PACS.   PMHx of  HLD HTN DM Hx of right femoral vein, was treated with xalreto, had her first GI bleeding. 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  PHYSICAL EXAMNIATION:  Gen: NAD, conversant, well nourised, well groomed                     Cardiovascular: Regular rate rhythm, no peripheral edema, warm, nontender. Eyes: Conjunctivae clear without exudates or hemorrhage Neck: Supple, no carotid bruits. Pulmonary: Clear to auscultation bilaterally   NEUROLOGICAL EXAM:  MENTAL STATUS: Speech/cognition: Awake, alert, oriented to history taking and casual conversation    11/24/2024    2:22 PM  Montreal Cognitive Assessment   Visuospatial/ Executive (0/5) 2  Naming (0/3) 1  Attention: Read list of digits (0/2) 2  Attention: Read list of letters (0/1) 0  Attention: Serial 7 subtraction starting at 100 (0/3) 1  Language: Repeat phrase (0/2) 1  Language : Fluency (0/1) 1  Abstraction (0/2) 1  Delayed Recall (0/5) 0  Orientation (0/6) 6  Total 15    CRANIAL NERVES: CN II: Visual fields are full to confrontation. Pupils are round equal and briskly reactive to light. CN III, IV, VI: extraocular movement are normal. No ptosis. CN V: Facial sensation is intact to light touch CN VII: Face is symmetric with normal eye  closure  CN VIII: Hearing is normal to causal conversation. CN IX, X: Phonation is normal. CN XI: Head turning and shoulder shrug are intact  MOTOR: There is no pronator drift of out-stretched arms. Muscle bulk and tone are normal. Muscle strength is normal.  REFLEXES: Reflexes are 2+ and symmetric at the biceps, triceps, knees, and ankles. Plantar responses are flexor.  SENSORY: Intact to light touch, pinprick and vibratory sensation  are intact in fingers and toes.  COORDINATION: There is no trunk or limb dysmetria noted.  GAIT/STANCE: Posture is normal. Gait is steady   REVIEW OF SYSTEMS:  Full 14 system review of systems performed and notable only for as above All other review of systems were negative.   ALLERGIES: Allergies[1]  HOME MEDICATIONS: Current Outpatient Medications  Medication Sig Dispense Refill   acetaminophen  (TYLENOL ) 500 MG tablet Take 500-1,000 mg by mouth See admin instructions. Take 1000mg  in the AM and 500mg  in the PM.     atorvastatin (LIPITOR) 10 MG tablet Take 10 mg by mouth daily.     metFORMIN  (GLUCOPHAGE ) 500 MG tablet Take 500 mg by mouth daily with breakfast.     metoprolol  succinate (TOPROL  XL) 25 MG 24 hr tablet Take 1 tablet (25 mg total) by mouth daily. 30 tablet 11   mirtazapine  (REMERON ) 15 MG tablet Take 15 mg by mouth at bedtime.     methocarbamol  (ROBAXIN ) 500 MG tablet Take 1 tablet (500 mg total) by mouth every 8 (eight) hours as needed for muscle spasms. (Patient not taking: Reported on 11/24/2024) 30 tablet 0   pantoprazole  (PROTONIX ) 40 MG tablet Take 1 tablet (40 mg total) by mouth daily before breakfast. (Patient not taking: Reported on 11/24/2024) 30 tablet 11   polyethylene glycol (MIRALAX  / GLYCOLAX ) 17 g packet Take 17 g by mouth daily as needed for moderate constipation. (Patient not taking: Reported on 11/24/2024) 14 each 0   senna-docusate (SENOKOT-S) 8.6-50 MG tablet Take 2 tablets by mouth 2 (two) times daily. (Patient not taking: Reported on 11/24/2024)     No current facility-administered medications for this visit.    PAST MEDICAL HISTORY: Past Medical History:  Diagnosis Date   Arthritis    Chronic headache    Complication of anesthesia    allergy to Novocaine    Diabetes mellitus    GERD (gastroesophageal reflux disease)    GI bleed    Heart murmur    High cholesterol    Hyperlipidemia    Hypertension    Intracranial atherosclerosis    per MRA    Migraine    Narcolepsy and cataplexy    OSA    states has not used CPAP in over a year   Peripheral vascular disease    Rectal prolapse    Stage 3b chronic kidney disease (CKD) (HCC)    Trigger finger of all digits of right hand     PAST SURGICAL HISTORY: Past Surgical History:  Procedure Laterality Date   ABDOMINAL HYSTERECTOMY     Fibroids   BACK SURGERY     L4,L5 discectomy   BOTOX  INJECTION N/A 01/02/2023   Procedure: BOTOX  INJECTION;  Surgeon: Elisabeth Valli BIRCH, MD;  Location: [facility];  Service: Urology;  Laterality: N/A;   CARDIAC CATHETERIZATION     CARPAL TUNNEL RELEASE Right 01/18/2017   COLONOSCOPY N/A 07/28/2022   Procedure: COLONOSCOPY;  Surgeon: Rosalie Kitchens, MD;  Location: [facility];  Service: Gastroenterology;  Laterality: N/A;   COLONOSCOPY WITH PROPOFOL  N/A 06/17/2021  Procedure: COLONOSCOPY WITH PROPOFOL ;  Surgeon: Dianna Specking, MD;  Location: [facility];  Service: Endoscopy;  Laterality: N/A;   CYSTOSCOPY WITH INJECTION N/A 01/02/2023   Procedure: CYSTOSCOPY WITH LUEVENIA;  Surgeon: Elisabeth Valli BIRCH, MD;  Location: [facility];  Service: Urology;  Laterality: N/A;  45 MINS   ESOPHAGOGASTRODUODENOSCOPY N/A 10/10/2024   Procedure: EGD (ESOPHAGOGASTRODUODENOSCOPY);  Surgeon: Dianna Specking, MD;  Location: [facility];  Service: Gastroenterology;  Laterality: N/A;   ESOPHAGOGASTRODUODENOSCOPY (EGD) WITH PROPOFOL  N/A 06/17/2021   Procedure: ESOPHAGOGASTRODUODENOSCOPY (EGD) WITH PROPOFOL ;  Surgeon: Dianna Specking, MD;  Location: [facility];  Service: Endoscopy;  Laterality: N/A;   GIVENS CAPSULE STUDY N/A 10/10/2024   Procedure: IMAGING PROCEDURE, GI TRACT, INTRALUMINAL, VIA CAPSULE;  Surgeon: Dianna Specking, MD;  Location: [facility];  Service: Gastroenterology;  Laterality: N/A;   IR ANGIOGRAM SELECTIVE EACH ADDITIONAL VESSEL  04/06/2024   IR ANGIOGRAM SELECTIVE EACH ADDITIONAL VESSEL  04/06/2024   IR ANGIOGRAM SELECTIVE EACH ADDITIONAL VESSEL   04/06/2024   IR ANGIOGRAM SELECTIVE EACH ADDITIONAL VESSEL  04/06/2024   IR ANGIOGRAM SELECTIVE EACH ADDITIONAL VESSEL  04/06/2024   IR ANGIOGRAM SELECTIVE EACH ADDITIONAL VESSEL  04/06/2024   IR ANGIOGRAM SELECTIVE EACH ADDITIONAL VESSEL  10/05/2024   IR ANGIOGRAM SELECTIVE EACH ADDITIONAL VESSEL  10/05/2024   IR ANGIOGRAM SELECTIVE EACH ADDITIONAL VESSEL  10/05/2024   IR ANGIOGRAM SELECTIVE EACH ADDITIONAL VESSEL  10/05/2024   IR ANGIOGRAM VISCERAL SELECTIVE  04/06/2024   IR ANGIOGRAM VISCERAL SELECTIVE  10/05/2024   IR EMBO ART  VEN HEMORR LYMPH EXTRAV  INC GUIDE ROADMAPPING  04/06/2024   IR US  GUIDE VASC ACCESS RIGHT  04/06/2024   IR US  GUIDE VASC ACCESS RIGHT  10/05/2024   KIDNEY SURGERY  1998   Right.  growth removed   POLYPECTOMY  06/17/2021   Procedure: POLYPECTOMY;  Surgeon: Dianna Specking, MD;  Location: [facility];  Service: Endoscopy;;   POLYPECTOMY  07/28/2022   Procedure: POLYPECTOMY;  Surgeon: Rosalie Kitchens, MD;  Location: [facility];  Service: Gastroenterology;;   rectal prolapse repair  12/05/2011   ROTATOR CUFF REPAIR     Left   SHOULDER ARTHROSCOPY WITH SUBACROMIAL DECOMPRESSION, ROTATOR CUFF REPAIR AND BICEP TENDON REPAIR Left 05/25/2015   Procedure: SHOULDER ARTHROSCOPY WITH SUBACROMIAL DECOMPRESSION, ROTATOR CUFF REPAIR AND BICEP TENODESIS, DEBRIDEMENT. ;  Surgeon: Glendia Cordella Hutchinson, MD;  Location: [facility];  Service: Orthopedics;  Laterality: Left;  LEFT SHOULDER ROTATOR CUFF TEAR REPAIR, ARTHROSCOPY, DEBRIDEMENT, BICEPS TENODESIS, SUBACROMIAL DECOMPRESSION.   TOTAL ABDOMINAL HYSTERECTOMY     TRIGGER FINGER RELEASE Right 04/30/2018   Procedure: RELEASE TRIGGER FINGER/A-1 PULLEY RIGHT MIDDLE  AND SMALLn ;  Surgeon: Murrell Kuba, MD;  [facility];  Service: Orthopedics;  Laterality: Right;  FAB, Bier block   TUBAL LIGATION      FAMILY HISTORY: Family History  Problem Relation Age of Onset   Heart disease Mother    Throat cancer Father     Cancer Father        stomach   Kidney cancer Brother    Heart disease Brother     SOCIAL HISTORY: Social History   Socioeconomic History   Marital status: Married    Spouse name: Lamar   Number of children: 4   Years of education: 14   Highest education level: Not on file  Occupational History   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  Tobacco Use   Smoking status: Former    Current packs/day: 0.00    Types:  Cigarettes    Quit date: 11/13/1980    Years since quitting: 44.0   Smokeless tobacco: Never   Tobacco comments:    quit in 1982  Vaping Use   Vaping status: Never Used  Substance and Sexual Activity   Alcohol  use: No    Alcohol /week: 0.0 standard drinks of alcohol    Drug use: No   Sexual activity: Yes  Other Topics Concern   Not on file  Social History Narrative   21 st January 2014 , patient underwent PS and MSLT - MSLT had one  SREMs and an average time to fall asleep of  *.8 minutes , her Ewort is 20 points,: facit: this patient has severe hypersomnia and is at risk when driving. medication in form ogf nuvigil smaples had been dispensed to her but she ha not yet taken it. 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 In detail -discussion of diagnosis and treatment  takes place today , first with nuvigil and if insufficient, with XYREM. Her CPAP treats her OSA very well, and she uses it 6 hours  or more each night,  residual AHI of 1.1  would not allow for OSA to be still explaining this degree of sleepiness. 2 downloads were reviewed. labs reviewed.    Patient is married Beverlie) and lives at home with her husband and grandchild.   Patient has four children   Patient is retired.   Patient has a college education.   Patient is right-handed.   Patient does not drink any caffeine.      Social Drivers of  Health   Tobacco Use: Medium Risk (11/24/2024)   Patient History    Smoking Tobacco Use: Former    Smokeless Tobacco Use: Never    Passive Exposure: Not on file  Financial Resource Strain: Not on file  Food Insecurity: No Food Insecurity (10/05/2024)   Epic    Worried About Programme Researcher, Broadcasting/film/video in the Last Year: Never true    Ran Out of Food in the Last Year: Never true  Transportation Needs: No Transportation Needs (10/05/2024)   Epic    Lack of Transportation (Medical): No    Lack of Transportation (Non-Medical): No  Physical Activity: Not on file  Stress: Not on file  Social Connections: Socially Integrated (10/05/2024)   Social Connection and Isolation Panel    Frequency of Communication with Friends and Family: More than three times a week    Frequency of Social Gatherings with Friends and Family: Twice a week    Attends Religious Services: More than 4 times per year    Active Member of [employer] or Organizations: Yes    Attends Banker Meetings: 1 to 4 times per year    Marital Status: Married  Catering Manager Violence: Not At Risk (10/05/2024)   Epic    Fear of Current or Ex-Partner: No    Emotionally Abused: No    Physically Abused: No    Sexually Abused: No  Depression (PHQ2-9): Not on file  Alcohol  Screen: Not on file  Housing: Low Risk (10/05/2024)   Epic    Unable to Pay for Housing in the Last Year: No    Number of Times Moved in the Last Year: 0    Homeless in the Last Year: No  Utilities: Not At Risk (10/05/2024)  Epic    Threatened with loss of utilities: No  Health Literacy: Not on file      Modena Callander, M.D. Ph.D.  [facility] [address] [place], [place] [address] Ph: [phone] Fax: [phone]  CC:  Austin Nutley, MD [phone] [address] [place],  [place] [address]  Sun, Vyvyan, MD       [1]  Allergies Allergen Reactions   Codeine Anaphylaxis   Morphine And Codeine Anaphylaxis   Donepezil   Diarrhea   Lactose Intolerance (Gi) Diarrhea and Nausea Only   Mirabegron     Other Reaction(s): Caused her to go to the hospital   Rivaroxaban      Other Reaction(s): rectal bleeding   "

## 2024-11-24 NOTE — Telephone Encounter (Signed)
MRI order sent to [facility] [phone]

## 2024-11-27 ENCOUNTER — Encounter: Payer: Self-pay | Admitting: Neurology

## 2024-11-27 LAB — COMPREHENSIVE METABOLIC PANEL WITH GFR
ALT: 17 IU/L (ref 0–32)
AST: 16 IU/L (ref 0–40)
Albumin: 4.2 g/dL (ref 3.7–4.7)
Alkaline Phosphatase: 77 IU/L (ref 48–129)
BUN/Creatinine Ratio: 13 (ref 12–28)
BUN: 21 mg/dL (ref 8–27)
Bilirubin Total: 0.3 mg/dL (ref 0.0–1.2)
CO2: 19 mmol/L — ABNORMAL LOW (ref 20–29)
Calcium: 9.5 mg/dL (ref 8.7–10.3)
Chloride: 110 mmol/L — ABNORMAL HIGH (ref 96–106)
Creatinine, Ser: 1.56 mg/dL — ABNORMAL HIGH (ref 0.57–1.00)
Globulin, Total: 3 g/dL (ref 1.5–4.5)
Glucose: 129 mg/dL — ABNORMAL HIGH (ref 70–99)
Potassium: 4.3 mmol/L (ref 3.5–5.2)
Sodium: 144 mmol/L (ref 134–144)
Total Protein: 7.2 g/dL (ref 6.0–8.5)
eGFR: 33 mL/min/1.73 — ABNORMAL LOW

## 2024-11-27 LAB — CBC WITH DIFFERENTIAL/PLATELET
Basophils Absolute: 0 x10E3/uL (ref 0.0–0.2)
Basos: 0 %
EOS (ABSOLUTE): 0.1 x10E3/uL (ref 0.0–0.4)
Eos: 2 %
Hematocrit: 34.4 % (ref 34.0–46.6)
Hemoglobin: 10.7 g/dL — ABNORMAL LOW (ref 11.1–15.9)
Immature Grans (Abs): 0 x10E3/uL (ref 0.0–0.1)
Immature Granulocytes: 0 %
Lymphocytes Absolute: 1.1 x10E3/uL (ref 0.7–3.1)
Lymphs: 23 %
MCH: 29.2 pg (ref 26.6–33.0)
MCHC: 31.1 g/dL — ABNORMAL LOW (ref 31.5–35.7)
MCV: 94 fL (ref 79–97)
Monocytes Absolute: 0.4 x10E3/uL (ref 0.1–0.9)
Monocytes: 8 %
Neutrophils Absolute: 3 x10E3/uL (ref 1.4–7.0)
Neutrophils: 67 %
Platelets: 287 x10E3/uL (ref 150–450)
RBC: 3.67 x10E6/uL — ABNORMAL LOW (ref 3.77–5.28)
RDW: 14 % (ref 11.7–15.4)
WBC: 4.5 x10E3/uL (ref 3.4–10.8)

## 2024-11-27 LAB — ATN PROFILE
A -- Beta-amyloid 42/40 Ratio: 0.11
Beta-amyloid 40: 278.03 pg/mL
Beta-amyloid 42: 30.48 pg/mL
N -- NfL, Plasma: 4.92 pg/mL (ref 0.00–9.13)
T -- p-tau181: 2.36 pg/mL — AB (ref 0.00–0.97)

## 2024-11-27 LAB — SYPHILIS: RPR W/REFLEX TO RPR TITER AND TREPONEMAL ANTIBODIES, TRADITIONAL SCREENING AND DIAGNOSIS ALGORITHM: RPR Ser Ql: NONREACTIVE

## 2024-11-27 LAB — TSH: TSH: 0.743 u[IU]/mL (ref 0.450–4.500)

## 2024-11-27 LAB — VITAMIN B12: Vitamin B-12: 284 pg/mL (ref 232–1245)

## 2024-11-28 NOTE — Telephone Encounter (Signed)
 Pt does not remember what medication was for. She doesn't remember why she was seeing Gina nor Debbie.  She said she doesn't need medication refilled.

## 2024-11-28 NOTE — Telephone Encounter (Signed)
 Pt doesn't remember why she was seeing [facility] and doesn't remember seeing Debbie at all.  She said she doesn't know what the Remeron  was for.  She does not want it refilled and I assume will not be returning here.

## 2024-12-04 ENCOUNTER — Ambulatory Visit: Payer: Self-pay | Admitting: Neurology

## 2024-12-12 ENCOUNTER — Other Ambulatory Visit

## 2024-12-29 ENCOUNTER — Other Ambulatory Visit

## 2025-06-24 ENCOUNTER — Ambulatory Visit: Admitting: Adult Health
